# Patient Record
Sex: Female | Born: 1935 | Race: White | Hispanic: No | Smoking: Never smoker
Health system: Southern US, Community
[De-identification: ages and names within clinical notes are randomized; demographics above are authoritative.]

## PROBLEM LIST (undated history)

## (undated) DIAGNOSIS — M899 Disorder of bone, unspecified: Secondary | ICD-10-CM

## (undated) DIAGNOSIS — D126 Benign neoplasm of colon, unspecified: Secondary | ICD-10-CM

## (undated) DIAGNOSIS — K449 Diaphragmatic hernia without obstruction or gangrene: Secondary | ICD-10-CM

## (undated) DIAGNOSIS — R609 Edema, unspecified: Secondary | ICD-10-CM

## (undated) DIAGNOSIS — R1319 Other dysphagia: Secondary | ICD-10-CM

## (undated) DIAGNOSIS — J986 Disorders of diaphragm: Secondary | ICD-10-CM

## (undated) DIAGNOSIS — M545 Low back pain, unspecified: Secondary | ICD-10-CM

## (undated) DIAGNOSIS — K2970 Gastritis, unspecified, without bleeding: Secondary | ICD-10-CM

## (undated) DIAGNOSIS — G8929 Other chronic pain: Secondary | ICD-10-CM

## (undated) DIAGNOSIS — C4490 Unspecified malignant neoplasm of skin, unspecified: Secondary | ICD-10-CM

## (undated) DIAGNOSIS — K5730 Diverticulosis of large intestine without perforation or abscess without bleeding: Secondary | ICD-10-CM

## (undated) DIAGNOSIS — D649 Anemia, unspecified: Secondary | ICD-10-CM

## (undated) DIAGNOSIS — M1990 Unspecified osteoarthritis, unspecified site: Secondary | ICD-10-CM

## (undated) DIAGNOSIS — I872 Venous insufficiency (chronic) (peripheral): Secondary | ICD-10-CM

## (undated) DIAGNOSIS — G894 Chronic pain syndrome: Secondary | ICD-10-CM

## (undated) DIAGNOSIS — M949 Disorder of cartilage, unspecified: Secondary | ICD-10-CM

## (undated) DIAGNOSIS — R269 Unspecified abnormalities of gait and mobility: Secondary | ICD-10-CM

## (undated) DIAGNOSIS — F329 Major depressive disorder, single episode, unspecified: Secondary | ICD-10-CM

## (undated) DIAGNOSIS — I38 Endocarditis, valve unspecified: Secondary | ICD-10-CM

## (undated) DIAGNOSIS — I499 Cardiac arrhythmia, unspecified: Secondary | ICD-10-CM

## (undated) DIAGNOSIS — J449 Chronic obstructive pulmonary disease, unspecified: Secondary | ICD-10-CM

## (undated) DIAGNOSIS — F3289 Other specified depressive episodes: Secondary | ICD-10-CM

## (undated) DIAGNOSIS — E7800 Pure hypercholesterolemia, unspecified: Secondary | ICD-10-CM

## (undated) DIAGNOSIS — L97909 Non-pressure chronic ulcer of unspecified part of unspecified lower leg with unspecified severity: Secondary | ICD-10-CM

## (undated) DIAGNOSIS — I10 Essential (primary) hypertension: Secondary | ICD-10-CM

## (undated) HISTORY — DX: Cardiac arrhythmia, unspecified: I49.9

## (undated) HISTORY — DX: Pure hypercholesterolemia, unspecified: E78.00

## (undated) HISTORY — DX: Disorder of bone, unspecified: M89.9

## (undated) HISTORY — PX: ADENOIDECTOMY: SUR15

## (undated) HISTORY — PX: HIP SURGERY: SHX245

## (undated) HISTORY — DX: Edema, unspecified: R60.9

## (undated) HISTORY — DX: Essential (primary) hypertension: I10

## (undated) HISTORY — DX: Venous insufficiency (chronic) (peripheral): I87.2

## (undated) HISTORY — DX: Disorders of diaphragm: J98.6

## (undated) HISTORY — DX: Major depressive disorder, single episode, unspecified: F32.9

## (undated) HISTORY — DX: Low back pain: M54.5

## (undated) HISTORY — DX: Diaphragmatic hernia without obstruction or gangrene: K44.9

## (undated) HISTORY — DX: Diverticulosis of large intestine without perforation or abscess without bleeding: K57.30

## (undated) HISTORY — DX: Chronic pain syndrome: G89.4

## (undated) HISTORY — DX: Endocarditis, valve unspecified: I38

## (undated) HISTORY — DX: Anemia, unspecified: D64.9

## (undated) HISTORY — DX: Low back pain, unspecified: M54.50

## (undated) HISTORY — PX: BREAST SURGERY: SHX581

## (undated) HISTORY — DX: Unspecified osteoarthritis, unspecified site: M19.90

## (undated) HISTORY — PX: APPENDECTOMY: SHX54

## (undated) HISTORY — PX: TONSILLECTOMY: SUR1361

## (undated) HISTORY — DX: Other dysphagia: R13.19

## (undated) HISTORY — DX: Other specified depressive episodes: F32.89

## (undated) HISTORY — DX: Unspecified abnormalities of gait and mobility: R26.9

## (undated) HISTORY — PX: OTHER SURGICAL HISTORY: SHX169

## (undated) HISTORY — DX: Gastritis, unspecified, without bleeding: K29.70

## (undated) HISTORY — DX: Benign neoplasm of colon, unspecified: D12.6

## (undated) HISTORY — DX: Disorder of cartilage, unspecified: M94.9

## (undated) HISTORY — DX: Other chronic pain: G89.29

---

## 2008-07-03 ENCOUNTER — Encounter: Payer: Self-pay | Admitting: Pulmonary Disease

## 2009-01-15 ENCOUNTER — Encounter: Payer: Self-pay | Admitting: Pulmonary Disease

## 2009-01-20 ENCOUNTER — Encounter: Payer: Self-pay | Admitting: Pulmonary Disease

## 2009-03-29 ENCOUNTER — Encounter: Payer: Self-pay | Admitting: Pulmonary Disease

## 2009-04-15 ENCOUNTER — Encounter: Payer: Self-pay | Admitting: Pulmonary Disease

## 2009-06-02 ENCOUNTER — Encounter: Payer: Self-pay | Admitting: Pulmonary Disease

## 2009-09-01 ENCOUNTER — Encounter: Payer: Self-pay | Admitting: Pulmonary Disease

## 2009-11-04 ENCOUNTER — Inpatient Hospital Stay (HOSPITAL_COMMUNITY): Admission: EM | Admit: 2009-11-04 | Discharge: 2009-11-08 | Payer: Self-pay | Admitting: Emergency Medicine

## 2009-12-02 ENCOUNTER — Encounter (INDEPENDENT_AMBULATORY_CARE_PROVIDER_SITE_OTHER): Payer: Self-pay | Admitting: *Deleted

## 2009-12-02 ENCOUNTER — Ambulatory Visit: Payer: Self-pay | Admitting: Pulmonary Disease

## 2009-12-02 DIAGNOSIS — G894 Chronic pain syndrome: Secondary | ICD-10-CM

## 2009-12-02 DIAGNOSIS — R1319 Other dysphagia: Secondary | ICD-10-CM

## 2009-12-02 DIAGNOSIS — F329 Major depressive disorder, single episode, unspecified: Secondary | ICD-10-CM

## 2009-12-02 DIAGNOSIS — D126 Benign neoplasm of colon, unspecified: Secondary | ICD-10-CM | POA: Insufficient documentation

## 2009-12-02 DIAGNOSIS — D649 Anemia, unspecified: Secondary | ICD-10-CM

## 2009-12-02 DIAGNOSIS — J986 Disorders of diaphragm: Secondary | ICD-10-CM

## 2009-12-02 DIAGNOSIS — M949 Disorder of cartilage, unspecified: Secondary | ICD-10-CM

## 2009-12-02 DIAGNOSIS — R609 Edema, unspecified: Secondary | ICD-10-CM | POA: Insufficient documentation

## 2009-12-02 DIAGNOSIS — I38 Endocarditis, valve unspecified: Secondary | ICD-10-CM | POA: Insufficient documentation

## 2009-12-02 DIAGNOSIS — I872 Venous insufficiency (chronic) (peripheral): Secondary | ICD-10-CM | POA: Insufficient documentation

## 2009-12-02 DIAGNOSIS — E7800 Pure hypercholesterolemia, unspecified: Secondary | ICD-10-CM

## 2009-12-02 DIAGNOSIS — I10 Essential (primary) hypertension: Secondary | ICD-10-CM | POA: Insufficient documentation

## 2009-12-02 DIAGNOSIS — M545 Low back pain: Secondary | ICD-10-CM

## 2009-12-02 DIAGNOSIS — M1990 Unspecified osteoarthritis, unspecified site: Secondary | ICD-10-CM | POA: Insufficient documentation

## 2009-12-02 DIAGNOSIS — K5730 Diverticulosis of large intestine without perforation or abscess without bleeding: Secondary | ICD-10-CM | POA: Insufficient documentation

## 2009-12-02 DIAGNOSIS — M899 Disorder of bone, unspecified: Secondary | ICD-10-CM | POA: Insufficient documentation

## 2009-12-06 ENCOUNTER — Telehealth: Payer: Self-pay | Admitting: Pulmonary Disease

## 2009-12-15 ENCOUNTER — Telehealth: Payer: Self-pay | Admitting: Pulmonary Disease

## 2010-01-04 ENCOUNTER — Telehealth: Payer: Self-pay | Admitting: Pulmonary Disease

## 2010-01-17 ENCOUNTER — Ambulatory Visit
Admission: RE | Admit: 2010-01-17 | Discharge: 2010-01-17 | Payer: Self-pay | Source: Home / Self Care | Attending: Pulmonary Disease | Admitting: Pulmonary Disease

## 2010-01-20 ENCOUNTER — Telehealth: Payer: Self-pay | Admitting: Pulmonary Disease

## 2010-02-01 NOTE — Miscellaneous (Signed)
Summary: Records Release faxed to [facility], [place]  Clinical Lists Changes     records release signed by patient to get medical records from: Dr. Michelle Nasuti [facility] [address] [place] [phone]  P: [phone] F: [phone]  release faxed to the number above.  records release form sent to be scanned into EMR. Boone Master CNA/MA  December 02, 2009 5:34 PM

## 2010-02-01 NOTE — Progress Notes (Signed)
Summary: appt date  Phone Note Call from Patient Call back at [phone]   Caller: Son--mike Call For: nadel Reason for Call: Talk to Nurse Summary of Call: Patient was supposed to be called with a 6 week return visit w/Nadel, hasn't heard anything and is requesting appt date. Initial call taken by: Lehman Prom,  December 06, 2009 11:32 AM  Follow-up for Phone Call        per OV note pt needed a 6 week follow-up appt. Please advise.Carron Curie CMA  December 06, 2009 11:35 AM   Additional Follow-up for Phone Call Additional follow up Details #1::        ok to use 01-17-2010 at 2:30 for follow up [name] [facility]  December 06, 2009 11:44 AM   [id number]. Carron Curie CMA  December 06, 2009 11:47 AM  pt scheduled for 01-17-10 at 2:30 son aware. Carron Curie CMA  December 06, 2009 11:49 AM

## 2010-02-01 NOTE — Assessment & Plan Note (Signed)
Summary: new primary care consult-ok per sn-cj //kp   CC:  New patient to establish care....  History of Present Illness: 77 y/o WF, mother of Davon Folta, who has moved here from [place]...  she has multiple medical problems as noted below>>    ~  December 02, 2009:  New patient evaluation- she was hosp 11/3-7/11 after fall at home w/ comminuted left wrist fx & had surg by DrKKuzma   Current Problems:    ** we are attempting to get old records from her physicians in [place] **  DIAPHRAGMATIC DISORDER (ICD-519.4) - she has a chronically elev right hemidiaph, apparently idiopathic; and she is mostly asymptomatic w/o cough, phlegm, hemoptysis, ch in SOB, etc...  ~  11/11:  CXR showed calcif Ao, elev right hemidiaph, mild scarring at bases, NAD...  HYPERTENSION (ICD-401.9) VALVULAR HEART DISEASE (ICD-424.90) Hx of CARDIAC ARRHYTHMIA (ICD-427.9) - she is on ASA 81mg /d, DILACOR XR 240mg  Bid, VASOTEC 20mg /d, LASIX 40mg /d... BP today = 130/70 & she tolerates the meds well w/o apparent side effects... she denies HA, visual changes, CP, palipit, dizziness, syncope, ch in dyspnea, etc... she has been told about some type of valvular heart disease but she doesn't know the details & not sure when her last 2DEcho was done... she has never been on blood thinners etc...  ~ ~ 11/11:  EKG showed NSR, NSSTTWA, NAD...  VENOUS INSUFFICIENCY (ICD-459.81) LEG EDEMA, CHRONIC (ICD-782.3) - she has severe venous stasis changes and chronic edema, thickened skin over LE's etc... she knows to be careful w/ hygiene, elevation, no salt, etc...  HYPERCHOLESTEROLEMIA (ICD-272.0) - she has been on MEVACOR 20mg /d from her [place] physician... we do not have Lipid Profile results to review & we have sent for old records...  OTHER DYSPHAGIA (ICD-787.29) - she is on PRILOSEC 20mg /d & reports occas dysphagia but denies choking etc... she apparently has never had an Endoscopy or GI eval for this problem, "I  have to be careful when I eat"...  DIVERTICULOSIS OF COLON (ICD-562.10) Hx of COLONIC POLYPS (ICD-211.3) - she tells me that prev colonoscopy in [place] showed divertics & ?polyp but she does not recall any details...  she denies much constipation despite the narcotic analgesics- uses prunes as needed.  DEGENERATIVE JOINT DISEASE (ICD-715.90) - she has severe osteoarthritis w/ XRays 11/11 showing severe osteopenia, severe right hip degeneration & relative sparing of the left hip joint;  DJD knees w/ ?CPPD, abn patella;  left wrist fx- radial  head & ulnar styloid... she had surg left wrist by DrKKuzma, & currently in cast/ splint... Hx of BACK PAIN, LUMBAR (ICD-724.2) CHRONIC PAIN SYNDROME (ICD-338.4) - she has chronic pain- mostly from her arthritic condition- and was started on MSContin by her LMD in [place] in the summer of 2011> dose slowly increased & she is currently on 90mg  Bid (she takes a 60mg  tab + 30mg  tab Bid)... she does not believe that this med has anything to do w/ her recent fall, or her complaints of decr memory etc... we discussed the need to wean off this medication & try to deal w/ her arthritis pain thru an Orthopedist or poss a Pain Clinic...  ~  11/11:  she notes pain worse during the day while up & about> try to decr MSContin to 90mg AM & 60mg PM...  OSTEOPENIA (ICD-733.90) - XRays here showed severe osteopenia... she notes that she used to be on Fosamax but ?how long? & when stopped?... she states that she was told her prev BMD  was "good"... she has not been taking calcium supplements, but was prev on some OTC Vit D supplements... asked to resume Calcium, Women's MVI, Vit D 1000u daily...  Hx of DEPRESSION (ICD-311) - on CELEXA 20mg /d and she wants to continue this med...  ANEMIA, MILD (ICD-285.9) - Hg post op 11/11 wrist fx surg = 11.2   Preventive Screening-Counseling & Management  Alcohol-Tobacco     Smoking Status: never  Allergies (verified): 1)  ! Sulfa  Past  History:  Past Medical History: DIAPHRAGMATIC DISORDER (ICD-519.4) HYPERTENSION (ICD-401.9) VALVULAR HEART DISEASE (ICD-424.90) Hx of CARDIAC ARRHYTHMIA (ICD-427.9) VENOUS INSUFFICIENCY (ICD-459.81) LEG EDEMA, CHRONIC (ICD-782.3) HYPERCHOLESTEROLEMIA (ICD-272.0) OTHER DYSPHAGIA (ICD-787.29) DIVERTICULOSIS OF COLON (ICD-562.10) Hx of COLONIC POLYPS (ICD-211.3) DEGENERATIVE JOINT DISEASE (ICD-715.90) Hx of BACK PAIN, LUMBAR (ICD-724.2) CHRONIC PAIN SYNDROME (ICD-338.4) OSTEOPENIA (ICD-733.90) Hx of DEPRESSION (ICD-311) ANEMIA, MILD (ICD-285.9)  Past Surgical History: S/P T & A S/P appendectomy S/P bilat cataracts S/P breast surgery 1973 (benign) S/P right wrist surgery for fx after fall- 11/11 DrKuzma  Social History: widowed Lennox Leikam never smoked no alcohol  retired Smoking Status:  never  Review of Systems       The patient complains of fatigue, weakness, malaise, hoarseness, dyspnea on exertion, peripheral edema, constipation, dysphagia, joint pain, stiffness, arthritis, difficulty walking, and depression.  The patient denies fever, chills, sweats, anorexia, weight loss, sleep disorder, blurring, diplopia, eye irritation, eye discharge, vision loss, eye pain, photophobia, earache, ear discharge, tinnitus, decreased hearing, nasal congestion, nosebleeds, sore throat, chest pain, palpitations, syncope, orthopnea, PND, cough, dyspnea at rest, excessive sputum, hemoptysis, wheezing, pleurisy, nausea, vomiting, diarrhea, change in bowel habits, abdominal pain, melena, hematochezia, jaundice, gas/bloating, indigestion/heartburn, odynophagia, dysuria, hematuria, urinary frequency, urinary hesitancy, nocturia, incontinence, back pain, joint swelling, muscle cramps, muscle weakness, sciatica, restless legs, leg pain at night, leg pain with exertion, rash, itching, dryness, suspicious lesions, paralysis, paresthesias, seizures, tremors, vertigo, transient  blindness, frequent falls, frequent headaches, anxiety, memory loss, confusion, cold intolerance, heat intolerance, polydipsia, polyphagia, polyuria, unusual weight change, abnormal bruising, bleeding, enlarged lymph nodes, urticaria, allergic rash, hay fever, and recurrent infections.    Vital Signs:  Patient profile:   77 year old female Height:      63 inches Weight:      162 pounds BMI:     28.80 O2 Sat:      89 % on Room air Temp:     98.9 degrees F oral Pulse rate:   77 / minute BP sitting:   130 / 70  (right arm) Cuff size:   regular  Vitals Entered By: [name] CMA (December 02, 2009 1:58 PM)  O2 Sat at Rest %:  89 O2 Flow:  Room air CC: New patient to establish care... Is Patient Diabetic? No Pain Assessment Patient in pain? yes      Onset of pain  wrist pain ---more pain in knees and hips Comments meds updated today with pt and son   Physical Exam  Additional Exam:  WD, petite, 77 y/o WF chr ill appearing & in NAD... GENERAL:  Alert & oriented; pleasant & cooperative... HEENT:  [place]/AT, EOM-wnl, PERRLA, EACs-clear, TMs-wnl, NOSE-clear, THROAT-clear & wnl. NECK:  Supple w/ fairROM; no JVD; normal carotid impulses w/o bruits; no thyromegaly or nodules palpated; no lymphadenopathy. CHEST:  Decr BS right base w/ few rales, clear on left... HEART:  Regular Rhythm; gr 1-2/ 6 sys murmur at base, no diastolic murmur detected, w/o rubs or gallops heard... ABDOMEN:  Soft & nontender;  normal bowel sounds; no organomegaly or masses detected. EXT:  severe osteoathritic changes on hands, lef twristcast/ splint; decr ROM knees & hips, esp right;  severe chronic venous insuffic changes w/ thickened skin & mild chr tissue edema... NEURO:  CN's intact;  no focal neuro deficits... DERM:  No lesions noted; no rash etc...    MISC. Report  Procedure date:  12/02/2009  Findings:      DATA REVIEWED:  ~  Hosp records from 11/3-7 admission including H&P, DCSummary, XRays, Labs  reports, & EKG...   Impression & Recommendations:  Problem # 1:  DIAPHRAGMATIC DISORDER (ICD-519.4) She has chr elev right hemidiaph w/ assoc mild basilar atx & hypoxemia...  Problem # 2:  HYPERTENSION (ICD-401.9) Controlled on meds & recent hosp labs were WNL.Marland KitchenMarland Kitchen Her updated medication list for this problem includes:    Diltiazem Hcl Cr 240 Mg Xr24h-cap (Diltiazem hcl) .Marland Kitchen... Take one tablet by mouth two times a day    Vasotec 20 Mg Tabs (Enalapril maleate) .Marland Kitchen... Take one tablet by mouth once daily    Lasix 40 Mg Tabs (Furosemide) .Marland Kitchen... Take 1 tablet by mouth once a day  Problem # 3:  VALVULAR HEART DISEASE (ICD-424.90) We are awaiting records from [place] to assertain when last 2DEcho was done & when f/u procedure is warranted... Her updated medication list for this problem includes:    Aspirin 81 Mg Tabs (Aspirin) .Marland Kitchen... Take 1 tablet by mouth once a day  Problem # 4:  VENOUS INSUFFICIENCY (ICD-459.81) She has severe chr ven insuffic & chr edema... we discussed the need for low sodium diet, elevation, continue the Lasix...  Problem # 5:  HYPERCHOLESTEROLEMIA (ICD-272.0) She will continue the Mevacor & we will check old records when avail & check FLP on return... Her updated medication list for this problem includes:    Mevacor 20 Mg Tabs (Lovastatin) .Marland Kitchen... Take one tablet by mouth at bedtime  Problem # 6:  DIVERTICULOSIS OF COLON (ICD-562.10) GI appears stable & only min constip w/ all her pain meds...  Problem # 7:  DEGENERATIVE JOINT DISEASE (ICD-715.90) Severe DJD esp right hip & she has appt DrBlackman... she is only 77 y/o & may be a candidate for THR... Her updated medication list for this problem includes:    Aspirin 81 Mg Tabs (Aspirin) .Marland Kitchen... Take 1 tablet by mouth once a day    Morphine Sulfate Cr 60 Mg Xr12h-tab (Morphine sulfate) .Marland Kitchen... Take 1 tab by mouth two times a day as directed...    Morphine Sulfate Cr 30 Mg Xr12h-tab (Morphine sulfate) .Marland Kitchen... Take 1 tab by mouth  once daily in the am with the 60mg  tab for a total of 90mg  in am...  Problem # 8:  CHRONIC PAIN SYNDROME (ICD-338.4) We are going to start weaning her off the chr Morphine therapy... for now decr the 90mg  Bid to 90mg  AM & 60mg  PM...  Problem # 9:  OSTEOPENIA (ICD-733.90) By Jillyn Hidden she appears to have severe osteoporosis but she says she was told BMD was "good" and her Fosamax was stopped... we are awaitingh records but I suspect she will need f/u BMd & consideration of Forteo Rx (the wrist fx was relatively minor trauma & is a deficiency fx in my opinion)...  Problem # 10:  OTHER MEDICAL PROBLEMS AS NOTED>>>  Complete Medication List: 1)  Aspirin 81 Mg Tabs (Aspirin) .... Take 1 tablet by mouth once a day 2)  Diltiazem Hcl Cr 240 Mg Xr24h-cap (Diltiazem hcl) .... Take one tablet  by mouth two times a day 3)  Vasotec 20 Mg Tabs (Enalapril maleate) .... Take one tablet by mouth once daily 4)  Lasix 40 Mg Tabs (Furosemide) .... Take 1 tablet by mouth once a day 5)  Mevacor 20 Mg Tabs (Lovastatin) .... Take one tablet by mouth at bedtime 6)  Prilosec 20 Mg Cpdr (Omeprazole) .... Take 1 tablet by mouth once a day 7)  Miralax Powd (Polyethylene glycol 3350) .... Once daily 8)  Celexa 20 Mg Tabs (Citalopram hydrobromide) .... Take 1 tablet by mouth once a day 9)  Morphine Sulfate Cr 60 Mg Xr12h-tab (Morphine sulfate) .... Take 1 tab by mouth two times a day as directed... 10)  Morphine Sulfate Cr 30 Mg Xr12h-tab (Morphine sulfate) .... Take 1 tab by mouth once daily in the am with the 60mg  tab for a total of 90mg  in am... 11)  Caltrate 600+d Plus 600-400 Mg-unit Tabs (Calcium carbonate-vit d-min) .... Take one calcium tab daily... 12)  Womens Multivitamin Plus Tabs (Multiple vitamins-minerals) .... Take one vitamin supplement daily... 13)  Vitamin D3 1000 Unit Tabs (Cholecalciferol) .... Take one vit d supplement daily...  Patient Instructions: 1)  Today we updated your med list- see below.... 2)   We decided to try and wean down the dose of your Morphine pain medication:  try 90mg  in the AM, and 60mg  on the PM..Marland Kitchen 3)  We will attempt to get records from your doctor in [place] to help [place] with your care going forward... 4)  Call for any problems.Marland KitchenMarland Kitchen 5)  Let's plan a follow up appt in about 6 weeks.Marland KitchenMarland Kitchen

## 2010-02-02 ENCOUNTER — Telehealth: Payer: Self-pay | Admitting: Pulmonary Disease

## 2010-02-03 NOTE — Progress Notes (Signed)
Summary: refills  Phone Note Call from Patient Call back at [phone]   Caller: Patient Call For: nadel Reason for Call: Refill Medication Summary of Call: Pt requests refills on diltiazem hcl cr 240mg  and vasotec 20mg .// Olevia Perches phone number is [phone] Initial call taken by: Darletta Moll,  January 04, 2010 4:26 PM  Follow-up for Phone Call        [facility] x 1. Calling to confirm medications and pharmacy with pt. Zackery Barefoot CMA  January 04, 2010 5:03 PM   I spoke with the pt and she wants rx sent to [facility] on [address]. refills sent. Carron Curie CMA  January 05, 2010 10:13 AM     Prescriptions: VASOTEC 20 MG TABS (ENALAPRIL MALEATE) take one tablet by mouth once daily  #30 x 3   Entered by:   Carron Curie CMA   Authorized by:   Michele Mcalpine MD   Signed by:   Carron Curie CMA on 01/05/2010   Method used:   Electronically to        [id number] (retail)       [address]       [place], [place]  [address]       Ph: [phone]       Fax: [phone]   RxID:   [id number] DILTIAZEM HCL CR 240 MG XR24H-CAP (DILTIAZEM HCL) take one tablet by mouth two times a day  #60 x 3   Entered by:   Carron Curie CMA   Authorized by:   Michele Mcalpine MD   Signed by:   Carron Curie CMA on 01/05/2010   Method used:   Electronically to        [id number] (retail)       [address]       [place], [place]  [address]       Ph: [phone]       Fax: [phone]   RxID:   [id number]

## 2010-02-03 NOTE — Progress Notes (Signed)
Summary: has diarrhea wants to know what to get   Phone Note Call from Patient   Caller: Patient Call For: dr [name] Summary of Call: Patient phoned and stated that she has diarrhea and wants to know what she should get from the pharmacy keopeptate or what is recconmed these days? She can be reached at [phone] Initial call taken by: Vedia Coffer,  January 20, 2010 3:54 PM  Follow-up for Phone Call        per SN---recs are to use otc immodium 1-2 tabs by mouth every 6 hours as needed for watery diarrhea, align once capsule daily and activia yogurt once daily.Esaw Dace stated that she will have her daughter call me back since she was having a hard time getting all of this down [name] CMA  January 20, 2010 4:22 PM   Additional Follow-up for Phone Call Additional follow up Details #1::        pts daughter in law called me back and she is aware of SN recs.  will call back for further problems [name] [facility]  January 20, 2010 4:29 PM

## 2010-02-03 NOTE — Assessment & Plan Note (Signed)
Summary: 6 week f/u//jrc   CC:  6 week ROV & review of mult medical issues....  History of Present Illness: 77 y/o WF, mother of Redonna Wilbert, who has moved here from [place]...  she has multiple medical problems as noted below>>    ~  December 02, 2009:  New patient evaluation- she was hosp 11/3-7/11 after fall at home w/ comminuted left wrist fx & had surg by DrKKuzma > SEE INITIAL PROBLEM LIST & DISCUSSION BELOW:    ~  January 17, 2010:  she has severe DJD (esp right hip), left wrist fx & surg, +LBP, etc> on MSContin per LMD in [place] that we have been trying to wean- now down from 90mg Bid to 60mg Bid (she had some withdrawal sweats as she weaned but now improved)... family notes she is "more with it"... she had f/u Ortho, DrBlackman, & given injection in knees & hip (helped some), started on MOBIC 15mg /d as well, he is considering right THR w/ ant approach... still f/u w/ DrKuzma for wrist, getting PT/ rehab weekly, improved... she will be moving to [place] independ living... we discussed slowly decr MS further 60-30, 30-30, and f/u 38mo.    Current Problems:    ** we are attempting to get old records from her physicians in [place] **  DIAPHRAGMATIC DISORDER (ICD-519.4) - she has a chronically elev right hemidiaph, apparently idiopathic; and she is mostly asymptomatic w/o cough, phlegm, hemoptysis, ch in SOB, etc...  ~  11/11:  CXR showed calcif Ao, elev right hemidiaph, mild scarring at bases, NAD...  HYPERTENSION (ICD-401.9) VALVULAR HEART DISEASE (ICD-424.90) Hx of CARDIAC ARRHYTHMIA (ICD-427.9) - she is on ASA 81mg /d, DILACOR XR 240mg  Bid, VASOTEC 20mg /d, LASIX 40mg /d... BP today = 132/68 & she tolerates the meds well w/o apparent side effects... she denies HA, visual changes, CP, palipit, dizziness, syncope, ch in dyspnea, etc... she has been told about some type of valvular heart disease but she doesn't know the details & not sure when her last 2DEcho was done... she  has never been on blood thinners etc...  ~  11/11:  EKG showed NSR, NSSTTWA, NAD...  VENOUS INSUFFICIENCY (ICD-459.81) LEG EDEMA, CHRONIC (ICD-782.3) - she has severe venous stasis changes and chronic edema, thickened skin over LE's etc... she knows to be careful w/ hygiene, elevation, no salt, etc...  HYPERCHOLESTEROLEMIA (ICD-272.0) - she has been on MEVACOR 20mg /d from her [place] physician... we do not have Lipid Profile results to review & we have sent for old records...  OTHER DYSPHAGIA (ICD-787.29) - she is on PRILOSEC 20mg /d & reports occas dysphagia but denies choking etc... she apparently has never had an Endoscopy or GI eval for this problem, "I have to be careful when I eat"...  DIVERTICULOSIS OF COLON (ICD-562.10) Hx of COLONIC POLYPS (ICD-211.3) - she tells me that prev colonoscopy in [place] showed divertics & ?polyp but she does not recall any details...  she denies much constipation despite the narcotic analgesics- uses prunes as needed.  DEGENERATIVE JOINT DISEASE (ICD-715.90) - she has severe osteoarthritis w/ XRays 11/11 showing severe osteopenia, severe right hip degeneration & relative sparing of the left hip joint;  DJD knees w/ ?CPPD, abn patella;  left wrist fx- radial  head & ulnar styloid... she had surg left wrist by DrKKuzma, & currently in cast/ splint...  ~  1/12:  they tell me that DrBlackman plans right THR via ant approach when she is ready. Hx of BACK PAIN, LUMBAR (ICD-724.2) CHRONIC PAIN SYNDROME (ICD-338.4) -  she has chronic pain- mostly from her arthritic condition- and was started on MSContin by her LMD in [place] in the summer of 2011> dose slowly increased & she was on 90mg  Bid (taking a 60mg  tab + 30mg  tab Bid)... she does not believe that this med has anything to do w/ her fall & wrist fx, or her complaints of decr memory etc... we discussed the need to wean off this medication & try to deal w/ her arthritis pain thru an Orthopedist or poss a Pain  Clinic...  ~  11/11:  she notes pain worse during the day while up & about> try to decr MSContin to 90mg AM & 60mg PM...  ~  1/12:  she is down to 60mg Bid & we discussed weaning further.  OSTEOPENIA (ICD-733.90) - XRays here showed severe osteopenia... she notes that she used to be on Fosamax but ?how long? & when stopped?... she states that she was told her prev BMD was "good"... she has not been taking calcium supplements, but was prev on some OTC Vit D supplements... asked to resume Calcium, Women's MVI, Vit D 1000u daily...  Hx of DEPRESSION (ICD-311) - on CELEXA 20mg /d and she wants to continue this med...  ANEMIA, MILD (ICD-285.9) - Hg post op 11/11 wrist fx surg = 11.2   Preventive Screening-Counseling & Management  Alcohol-Tobacco     Smoking Status: never  Allergies: 1)  ! Sulfa  Comments:  Nurse/Medical Assistant: The patient's medications and allergies were reviewed with the patient and were updated in the Medication and Allergy Lists.  Past History:  Past Medical History: DIAPHRAGMATIC DISORDER (ICD-519.4) HYPERTENSION (ICD-401.9) VALVULAR HEART DISEASE (ICD-424.90) Hx of CARDIAC ARRHYTHMIA (ICD-427.9) VENOUS INSUFFICIENCY (ICD-459.81) LEG EDEMA, CHRONIC (ICD-782.3) HYPERCHOLESTEROLEMIA (ICD-272.0) OTHER DYSPHAGIA (ICD-787.29) DIVERTICULOSIS OF COLON (ICD-562.10) Hx of COLONIC POLYPS (ICD-211.3) DEGENERATIVE JOINT DISEASE (ICD-715.90) Hx of BACK PAIN, LUMBAR (ICD-724.2) CHRONIC PAIN SYNDROME (ICD-338.4) OSTEOPENIA (ICD-733.90) Hx of DEPRESSION (ICD-311) ANEMIA, MILD (ICD-285.9)  Past Surgical History: S/P T & A S/P appendectomy S/P bilat cataracts S/P breast surgery 1973 (benign) S/P right wrist surgery for fx after fall- 11/11 DrKuzma  Family History: Reviewed history and no changes required.  Social History: Reviewed history from 12/02/2009 and no changes required. widowed Agape Hardiman never smoked no alcohol   retired  Review of Systems      See HPI       The patient complains of decreased hearing, dyspnea on exertion, peripheral edema, muscle weakness, and difficulty walking.  The patient denies anorexia, fever, weight loss, weight gain, vision loss, hoarseness, chest pain, syncope, prolonged cough, headaches, hemoptysis, abdominal pain, melena, hematochezia, severe indigestion/heartburn, hematuria, incontinence, suspicious skin lesions, transient blindness, depression, unusual weight change, abnormal bleeding, enlarged lymph nodes, and angioedema.    Vital Signs:  Patient profile:   77 year old female Height:      63 inches Weight:      136.13 pounds BMI:     24.20 O2 Sat:      96 % on Room air Temp:     96.8 degrees F oral Pulse rate:   70 / minute BP sitting:   132 / 68  (right arm) Cuff size:   regular  Vitals Entered By: [name] CMA (January 17, 2010 2:30 PM)  O2 Sat at Rest %:  96 O2 Flow:  Room air CC: 6 week ROV & review of mult medical issues... Is Patient Diabetic? No Pain Assessment Patient in pain? no      Comments meds udpated today  with pt   Physical Exam  Additional Exam:  WD, petite, 77 y/o WF chr ill appearing & in NAD... GENERAL:  Alert & oriented; pleasant & cooperative... HEENT:  Emma/AT, EOM-wnl, PERRLA, EACs-clear, TMs-wnl, NOSE-clear, THROAT-clear & wnl. NECK:  Supple w/ fairROM; no JVD; normal carotid impulses w/o bruits; no thyromegaly or nodules palpated; no lymphadenopathy. CHEST:  Decr BS right base w/ few rales, clear on left... HEART:  Regular Rhythm; gr 1-2/ 6 sys murmur at base, no diastolic murmur detected, w/o rubs or gallops heard... ABDOMEN:  Soft & nontender; normal bowel sounds; no organomegaly or masses detected. EXT:  severe osteoathritic changes on hands, lef twristcast/ splint; decr ROM knees & hips, esp right;  severe chronic venous insuffic changes w/ thickened skin & mild chr tissue edema... NEURO:  CN's intact;  no focal neuro  deficits... DERM:  No lesions noted; no rash etc...    Impression & Recommendations:  Problem # 1:  HYPERTENSION (ICD-401.9) BP controlled on meds>  no CP, palpit, ch in SOB or edema... continue same. Her updated medication list for this problem includes:    Diltiazem Hcl Cr 240 Mg Xr24h-cap (Diltiazem hcl) .Marland Kitchen... Take one tablet by mouth two times a day    Vasotec 20 Mg Tabs (Enalapril maleate) .Marland Kitchen... Take one tablet by mouth once daily    Lasix 40 Mg Tabs (Furosemide) .Marland Kitchen... Take 1 tablet by mouth once a day  Problem # 2:  LEG EDEMA, CHRONIC (ICD-782.3) Edema improved, wt down, etc>  same med, no salt, etc... Her updated medication list for this problem includes:    Lasix 40 Mg Tabs (Furosemide) .Marland Kitchen... Take 1 tablet by mouth once a day  Problem # 3:  HYPERCHOLESTEROLEMIA (ICD-272.0) We discussed f/u FLP on return... Her updated medication list for this problem includes:    Mevacor 20 Mg Tabs (Lovastatin) .Marland Kitchen... Take one tablet by mouth at bedtime  Problem # 4:  OTHER DYSPHAGIA (ICD-787.29) She has some persist dysphagia symptoms & we discussed poss incr med Prilosec OTC 20mg  to Bid, vs GI eval w/ EGD/ dil/ etc... she declines further eval or incr meds at this point & we will continue to follow... she knows to call for worsening symptoms for GI referral.  Problem # 5:  DEGENERATIVE JOINT DISEASE (ICD-715.90) Followed by DrBlackman for Ortho- hip/ knees; and DrKuzma- wrist... Her updated medication list for this problem includes:    Aspirin 81 Mg Tabs (Aspirin) .Marland Kitchen... Take 1 tablet by mouth once a day    Morphine Sulfate Cr 60 Mg Xr12h-tab (Morphine sulfate) .Marland Kitchen... Take 1 tab by mouth daily in the am... (do not fill before 02/17/10)    Morphine Sulfate Cr 30 Mg Xr12h-tab (Morphine sulfate) .Marland Kitchen... Take 2 tabs by mouth once daily in the evening... (do not fill before 02/17/10)  Problem # 6:  CHRONIC PAIN SYNDROME (ICD-338.4) We discussed further slow wean of her MS Contin from 60mg  Bid now to  60-30, then 30-30 if able... meds written as above...  Problem # 7:  Hx of DEPRESSION (ICD-311) Stable>  continue Celexa per request... Her updated medication list for this problem includes:    Celexa 20 Mg Tabs (Citalopram hydrobromide) .Marland Kitchen... Take 1 tablet by mouth once a day  Problem # 8:  OTHER MEDICAL PROBLEMS AS NOTED>>>  Complete Medication List: 1)  Aspirin 81 Mg Tabs (Aspirin) .... Take 1 tablet by mouth once a day 2)  Diltiazem Hcl Cr 240 Mg Xr24h-cap (Diltiazem hcl) .... Take one tablet by  mouth two times a day 3)  Vasotec 20 Mg Tabs (Enalapril maleate) .... Take one tablet by mouth once daily 4)  Lasix 40 Mg Tabs (Furosemide) .... Take 1 tablet by mouth once a day 5)  Mevacor 20 Mg Tabs (Lovastatin) .... Take one tablet by mouth at bedtime 6)  Prilosec 20 Mg Cpdr (Omeprazole) .... Take 1 tablet by mouth once a day 7)  Miralax Powd (Polyethylene glycol 3350) .... Once daily 8)  Celexa 20 Mg Tabs (Citalopram hydrobromide) .... Take 1 tablet by mouth once a day 9)  Caltrate 600+d Plus 600-400 Mg-unit Tabs (Calcium carbonate-vit d-min) .... Take one calcium tab daily... 10)  Womens Multivitamin Plus Tabs (Multiple vitamins-minerals) .... Take one vitamin supplement daily... 11)  Vitamin D3 1000 Unit Tabs (Cholecalciferol) .... Take one vit d supplement daily... 12)  Morphine Sulfate Cr 60 Mg Xr12h-tab (Morphine sulfate) .... Take 1 tab by mouth daily in the am... (do not fill before 02/17/10) 13)  Morphine Sulfate Cr 30 Mg Xr12h-tab (Morphine sulfate) .... Take 2 tabs by mouth once daily in the evening... (do not fill before 02/17/10)  Patient Instructions: 1)  Today we updated your med list- see below.... 2)  We decided to continue the slow Morphine taper by trying 60mg  in the AM, and 30mg  in the PM..Marland Kitchen 3)  You may continue to taper from there if you are able based on the amount of pain you are having... 4)  Call for any questions.Marland KitchenMarland Kitchen 5)  Please schedule a follow-up appointment in 3  months, sooner as needed... Prescriptions: MORPHINE SULFATE CR 30 MG XR12H-TAB (MORPHINE SULFATE) take 2 tabs by mouth once daily in the EVENING... (DO NOT FILL BEFORE 02/17/10)  #60 x 0   Entered and Authorized by:   Michele Mcalpine MD   Signed by:   Michele Mcalpine MD on 01/17/2010   Method used:   Print then Give to Patient   RxID:   [id number] MORPHINE SULFATE CR 60 MG XR12H-TAB (MORPHINE SULFATE) take 1 tab by mouth daily in the AM... (DO NOT FILL BEFORE 02/17/10)  #30 x 0   Entered and Authorized by:   Michele Mcalpine MD   Signed by:   Michele Mcalpine MD on 01/17/2010   Method used:   Print then Give to Patient   RxID:   [id number] MORPHINE SULFATE CR 30 MG XR12H-TAB (MORPHINE SULFATE) take 2 tabs by mouth once daily in the EVENING...  #60 x 0   Entered and Authorized by:   Michele Mcalpine MD   Signed by:   Michele Mcalpine MD on 01/17/2010   Method used:   Print then Give to Patient   RxID:   [id number] MORPHINE SULFATE CR 60 MG XR12H-TAB (MORPHINE SULFATE) take 1 tab by mouth daily in the AM...  #30 x 0   Entered and Authorized by:   Michele Mcalpine MD   Signed by:   Michele Mcalpine MD on 01/17/2010   Method used:   Print then Give to Patient   RxID:   [id number]

## 2010-02-03 NOTE — Progress Notes (Signed)
Summary: morphine  Phone Note Call from Patient Call back at [phone]   Caller: Patient Call For: nadel Reason for Call: Talk to Nurse Summary of Call: Patient said [name] is trying to wean her off morphine.  He decreased dose at last ov, patient said she is calling to let him now she has been fine with decreased dose. Initial call taken by: Lehman Prom,  December 15, 2009 2:09 PM  Follow-up for Phone Call        called spoke with patient who verified that the decreased dosing of her morphine is doing well for her pain management - taking morphine 90mg  in the AM and 60mg  in the PM as recommended at ov.  pt would like to know if SN wants her to continue at this dosing until 1.16.12 or decrease it more.  please advise, thanks! Boone Master CNA/MA  December 15, 2009 4:01 PM   Additional Follow-up for Phone Call Additional follow up Details #1::        per SN---lets keep going---so decrease the morphine  to 60mg  two times a day ----if this does not work we can go back up----lmomtcb for pt [name] CMA  December 15, 2009 5:16 PM     Additional Follow-up for Phone Call Additional follow up Details #2::    attempted to call pt again---lmomtcb [name] CMA  December 16, 2009 10:56 AM    called and spoke with pts son due to unable to reach pt---he stated that she did see Dr. Lina Sar did give pt an injection in her hip and is trying to avoid any surgery---he is aware of the change in the morphine---60mg  two times a day and we will reassess this at her appt in jan to reduce or keep the same.  pts son also wanted to know that after her wrist heals--pt is wanting to live on her on and he is not sure if SN is ok with this.  explained that i would check with SN and give him a call back with recs about that.   [name] [facility]  December 16, 2009 2:07 PM   Additional Follow-up for Phone Call Additional follow up Details #3:: Details for Additional Follow-up Action Taken: called  and spoke with pts son and he is aware per SN that if the son feels that pt is capable of living on her own then SN does not have a problem with that.  he will have to leave this up to the family to make that decision.  pts son mike voiced his understanding of this [name] [facility]  December 16, 2009 5:35 PM

## 2010-02-09 NOTE — Progress Notes (Signed)
Summary: has questions regarding his mom  Phone Note Call from Patient   Caller: SON/[name] Call For: Othman Masur Reason for Call: Referral Summary of Call: Patients son phoned and has several questions regarding his mom he can be reached at [phone] Initial call taken by: Vedia Coffer,  February 02, 2010 9:08 AM  Follow-up for Phone Call        Spoke with pt son [name] and he has some questions about pt morphine: 1) Pt has tapered to 60mg  morphine in Am ans 30mg  in PM. When she tapered to this dose she went through some withdrawl symptoms such as chills. shakes, loss of appetite. He states these lasted about 8 days. Pt is fine now.  She is having some increased pain in her hip, but she still wants to keep tapering dose. [name] wants to know can the morphine be tapered in smaller increments. Instead of dropping by 30mg  can they decrease by 10 or 15mg  instead? If so they will need rx for this.   2) also, [name] states that he knows the ultimate goal is to have surgery on pt hip to alleviate the pain source, but he wants to know can she have the operation while still on morphine, or does she need to be completly of the medication before surgery? Please advise. Carron Curie CMA  February 02, 2010 9:57 AM  [facility]  Additional Follow-up for Phone Call Additional follow up Details #1::        per SN----yes she can still be on pain meds with the surgery---the morphine also comes in 15mg  tabs---now on 60mg  every am and 30mg  every pm----how many of each size pil are left after today? [name] CMA  February 02, 2010 1:35 PM      Additional Follow-up for Phone Call Additional follow up Details #2::    Spoke with pt's son and advised of the above recs per SN.  He states that he is unsure of how many tablets she has left of each, but states that she has plenty of pills left.  He states that he had thought about breaking it in half but another doc advised that doing that with morphine could be  very dangerous.  Pls advise thanks! Follow-up by: Vernie Murders,  February 02, 2010 2:08 PM  Additional Follow-up for Phone Call Additional follow up Details #3:: Details for Additional Follow-up Action Taken: called and spoke with pts son and he stated that the pt has #32 of the 60mg   and  #110 of the 30mg ---he will hold on to these in case the  pt will need to go back up on the dose---for now we will give them the rx for the morphine 15mg  tabs and she will decrease to 45 mg /30mg    for at least 2 wks and then we will try 30mg /30mg  for couple of weeks and down to  30mg /15mg  and so every several weeks---her son will keep [place] up to date and call for any concerns.  he is aware that rx is up front to be picked up [name] CMA  February 03, 2010 12:50 PM   New/Updated Medications: MORPHINE SULFATE CR 15 MG XR12H-TAB (MORPHINE SULFATE) take as directed Prescriptions: MORPHINE SULFATE CR 15 MG XR12H-TAB (MORPHINE SULFATE) take as directed  #100 x 0   Entered by:   [name] CMA   Authorized by:   Michele Mcalpine MD   Signed by:   [name] CMA  on 02/03/2010   Method used:   Print then Give to Patient   RxID:   [id number]

## 2010-02-15 ENCOUNTER — Telehealth (INDEPENDENT_AMBULATORY_CARE_PROVIDER_SITE_OTHER): Payer: Self-pay | Admitting: *Deleted

## 2010-02-17 NOTE — Letter (Signed)
Summary: Michelle Nasuti MD  Michelle Nasuti MD   Imported By: Lester [place] 02/07/2010 09:52:01  _____________________________________________________________________  External Attachment:    Type:   Image     Comment:   External Document

## 2010-02-17 NOTE — Op Note (Signed)
Summary: Cataract/Rohit Truddie Hidden MD  Cataract/Rohit Truddie Hidden MD   Imported By: Lester [place] 02/07/2010 09:42:58  _____________________________________________________________________  External Attachment:    Type:   Image     Comment:   External Document

## 2010-02-17 NOTE — Letter (Signed)
Summary: Tseng,Chun-Ming MD  Tseng,Chun-Ming MD   Imported By: Lester [place] 02/07/2010 09:47:31  _____________________________________________________________________  External Attachment:    Type:   Image     Comment:   External Document

## 2010-02-17 NOTE — Letter (Signed)
Summary: Michelle Nasuti MD  Michelle Nasuti MD   Imported By: Lester  02/07/2010 09:40:08  _____________________________________________________________________  External Attachment:    Type:   Image     Comment:   External Document

## 2010-02-17 NOTE — Miscellaneous (Signed)
Summary: 1993-2011 [facility]  [phone] [facility]   Imported By: Lester [place] 02/07/2010 09:49:45  _____________________________________________________________________  External Attachment:    Type:   Image     Comment:   External Document

## 2010-02-17 NOTE — Letter (Signed)
Summary: Michelle Nasuti MD  Michelle Nasuti MD   Imported By: Lester [place] 02/07/2010 09:45:02  _____________________________________________________________________  External Attachment:    Type:   Image     Comment:   External Document

## 2010-02-23 NOTE — Progress Notes (Signed)
Summary: rx's   Phone Note Call from Patient Call back at Home Phone [phone]   Caller: Patient Call For: nadel Summary of Call: pt requests new rx's for the following (these were previously prescribed by another dr. in the state that pt formally lived in): CELEXA 20mg  / MEVACOR 20mg  / PRILOSEC 20mg  / and LASIX 40mg . [facility] [address]  Initial call taken by: Tivis Ringer, CNA,  February 15, 2010 1:24 PM  Follow-up for Phone Call        Pls advise if okay to fill these meds for patient or if she should continue to get refills from prescribing doctor in [place].Michel Bickers [facility]  February 15, 2010 2:44 PM  Additional Follow-up for Phone Call Additional follow up Details #1::        that is fine  per mar with #30 and 5 refills.  Additional Follow-up by: Rubye Oaks NP,  February 15, 2010 4:40 PM    Additional Follow-up for Phone Call Additional follow up Details #2::    Rxs were sent to pharm.  Spoke with pt and notified this was done.  Follow-up by: Vernie Murders,  February 15, 2010 4:46 PM  Prescriptions: CELEXA 20 MG TABS (CITALOPRAM HYDROBROMIDE) Take 1 tablet by mouth once a day  #30 x 5   Entered by:   Vernie Murders   Authorized by:   Rubye Oaks NP   Signed by:   Vernie Murders on 02/15/2010   Method used:   Electronically to        [facility] (retail)       [address]       [place], [place]  [address]       Ph: [phone]       Fax: [phone]   RxID:   [id number] PRILOSEC 20 MG CPDR (OMEPRAZOLE) Take 1 tablet by mouth once a day  #30 x 5   Entered by:   Vernie Murders   Authorized by:   Rubye Oaks NP   Signed by:   Vernie Murders on 02/15/2010   Method used:   Electronically to        [facility] (retail)       [address]       [place], [place]  [address]       Ph: [phone]       Fax: [phone]   RxID:   [id number] MEVACOR 20 MG TABS (LOVASTATIN) take one tablet by mouth at bedtime  #30 x 5   Entered by:   Vernie Murders  Authorized by:   Rubye Oaks NP   Signed by:   Vernie Murders on 02/15/2010   Method used:   Electronically to        [facility] (retail)       [address]       [place], [place]  [address]       Ph: [phone]       Fax: [phone]   RxID:   [id number] LASIX 40 MG TABS (FUROSEMIDE) Take 1 tablet by mouth once a day  #30 x 5   Entered by:   Vernie Murders   Authorized by:   Rubye Oaks NP   Signed by:   Vernie Murders on 02/15/2010   Method used:   Electronically to        [facility] (retail)       [address]       [place], [place]  [address]  Ph: [phone]       Fax: [phone]   RxID:   [id number]

## 2010-03-13 ENCOUNTER — Emergency Department (HOSPITAL_COMMUNITY): Payer: Medicare Other

## 2010-03-13 ENCOUNTER — Inpatient Hospital Stay (HOSPITAL_COMMUNITY)
Admission: EM | Admit: 2010-03-13 | Discharge: 2010-03-17 | DRG: 682 | Disposition: A | Discharge status: Home / Self Care | Payer: Medicare Other | Attending: Internal Medicine | Admitting: Internal Medicine

## 2010-03-13 DIAGNOSIS — F329 Major depressive disorder, single episode, unspecified: Secondary | ICD-10-CM | POA: Diagnosis present

## 2010-03-13 DIAGNOSIS — F17200 Nicotine dependence, unspecified, uncomplicated: Secondary | ICD-10-CM | POA: Diagnosis present

## 2010-03-13 DIAGNOSIS — N390 Urinary tract infection, site not specified: Secondary | ICD-10-CM | POA: Diagnosis present

## 2010-03-13 DIAGNOSIS — I509 Heart failure, unspecified: Secondary | ICD-10-CM | POA: Diagnosis present

## 2010-03-13 DIAGNOSIS — G894 Chronic pain syndrome: Secondary | ICD-10-CM | POA: Diagnosis present

## 2010-03-13 DIAGNOSIS — N179 Acute kidney failure, unspecified: Principal | ICD-10-CM | POA: Diagnosis present

## 2010-03-13 DIAGNOSIS — I10 Essential (primary) hypertension: Secondary | ICD-10-CM | POA: Diagnosis present

## 2010-03-13 DIAGNOSIS — I472 Ventricular tachycardia, unspecified: Secondary | ICD-10-CM | POA: Diagnosis present

## 2010-03-13 DIAGNOSIS — F3289 Other specified depressive episodes: Secondary | ICD-10-CM | POA: Diagnosis present

## 2010-03-13 DIAGNOSIS — K2990 Gastroduodenitis, unspecified, without bleeding: Secondary | ICD-10-CM | POA: Diagnosis present

## 2010-03-13 DIAGNOSIS — D638 Anemia in other chronic diseases classified elsewhere: Secondary | ICD-10-CM | POA: Diagnosis present

## 2010-03-13 DIAGNOSIS — T502X5A Adverse effect of carbonic-anhydrase inhibitors, benzothiadiazides and other diuretics, initial encounter: Secondary | ICD-10-CM | POA: Diagnosis present

## 2010-03-13 DIAGNOSIS — I4729 Other ventricular tachycardia: Secondary | ICD-10-CM | POA: Diagnosis present

## 2010-03-13 DIAGNOSIS — I872 Venous insufficiency (chronic) (peripheral): Secondary | ICD-10-CM | POA: Diagnosis present

## 2010-03-13 DIAGNOSIS — M1610 Unilateral primary osteoarthritis, unspecified hip: Secondary | ICD-10-CM | POA: Diagnosis present

## 2010-03-13 DIAGNOSIS — K2970 Gastritis, unspecified, without bleeding: Secondary | ICD-10-CM | POA: Diagnosis present

## 2010-03-13 DIAGNOSIS — M899 Disorder of bone, unspecified: Secondary | ICD-10-CM | POA: Diagnosis present

## 2010-03-13 DIAGNOSIS — I5033 Acute on chronic diastolic (congestive) heart failure: Secondary | ICD-10-CM | POA: Diagnosis not present

## 2010-03-13 DIAGNOSIS — I38 Endocarditis, valve unspecified: Secondary | ICD-10-CM | POA: Diagnosis present

## 2010-03-13 DIAGNOSIS — K449 Diaphragmatic hernia without obstruction or gangrene: Secondary | ICD-10-CM | POA: Diagnosis present

## 2010-03-13 DIAGNOSIS — M169 Osteoarthritis of hip, unspecified: Secondary | ICD-10-CM | POA: Diagnosis present

## 2010-03-13 LAB — DIFFERENTIAL
Basophils Absolute: 0 K/uL (ref 0.0–0.1)
Basophils Relative: 0 % (ref 0–1)
Eosinophils Absolute: 0.2 10*3/uL (ref 0.0–0.7)
Eosinophils Relative: 1 % (ref 0–5)
Lymphocytes Relative: 7 % — ABNORMAL LOW (ref 12–46)
Lymphs Abs: 1.5 K/uL (ref 0.7–4.0)
Monocytes Absolute: 1.5 K/uL — ABNORMAL HIGH (ref 0.1–1.0)
Monocytes Relative: 7 % (ref 3–12)
Neutro Abs: 18.7 K/uL — ABNORMAL HIGH (ref 1.7–7.7)
Neutrophils Relative %: 85 % — ABNORMAL HIGH (ref 43–77)

## 2010-03-13 LAB — COMPREHENSIVE METABOLIC PANEL
ALT: 17 U/L (ref 0–35)
Albumin: 3.1 g/dL — ABNORMAL LOW (ref 3.5–5.2)
Alkaline Phosphatase: 83 U/L (ref 39–117)
BUN: 118 mg/dL — ABNORMAL HIGH (ref 6–23)
Chloride: 91 mEq/L — ABNORMAL LOW (ref 96–112)
Potassium: 4.1 mEq/L (ref 3.5–5.1)
Sodium: 129 mEq/L — ABNORMAL LOW (ref 135–145)
Total Bilirubin: 0.7 mg/dL (ref 0.3–1.2)

## 2010-03-13 LAB — COMPREHENSIVE METABOLIC PANEL WITH GFR
AST: 22 U/L (ref 0–37)
CO2: 25 meq/L (ref 19–32)
Calcium: 9.1 mg/dL (ref 8.4–10.5)
Creatinine, Ser: 2.7 mg/dL — ABNORMAL HIGH (ref 0.4–1.2)
GFR calc Af Amer: 21 mL/min — ABNORMAL LOW (ref >60)
GFR calc non Af Amer: 17 mL/min — ABNORMAL LOW (ref >60)
Glucose, Bld: 158 mg/dL — ABNORMAL HIGH (ref 70–99)
Total Protein: 7.4 g/dL (ref 6.0–8.3)

## 2010-03-13 LAB — URINE MICROSCOPIC-ADD ON

## 2010-03-13 LAB — LACTIC ACID, PLASMA: Lactic Acid, Venous: 1.3 mmol/L (ref 0.5–2.2)

## 2010-03-13 LAB — URINALYSIS, ROUTINE W REFLEX MICROSCOPIC
Bilirubin Urine: NEGATIVE
Glucose, UA: NEGATIVE mg/dL
Ketones, ur: NEGATIVE mg/dL
Nitrite: NEGATIVE
Protein, ur: 30 mg/dL — AB
Specific Gravity, Urine: 1.019 (ref 1.005–1.030)
Urobilinogen, UA: 1 mg/dL (ref 0.0–1.0)
pH: 5 (ref 5.0–8.0)

## 2010-03-13 LAB — CBC
HCT: 31.3 % — ABNORMAL LOW (ref 36.0–46.0)
Hemoglobin: 10 g/dL — ABNORMAL LOW (ref 12.0–15.0)
MCH: 25.3 pg — ABNORMAL LOW (ref 26.0–34.0)
MCHC: 31.9 g/dL (ref 30.0–36.0)
MCV: 79 fL (ref 78.0–100.0)
Platelets: 551 10*3/uL — ABNORMAL HIGH (ref 150–400)
RBC: 3.96 MIL/uL (ref 3.87–5.11)
RDW: 16.2 % — ABNORMAL HIGH (ref 11.5–15.5)
WBC: 21.9 10*3/uL — ABNORMAL HIGH (ref 4.0–10.5)

## 2010-03-13 LAB — POCT CARDIAC MARKERS
CKMB, poc: 7.7 ng/mL (ref 1.0–8.0)
Myoglobin, poc: >500 ng/mL (ref 12–200)
Troponin i, poc: <0.05 ng/mL (ref 0.00–0.09)

## 2010-03-13 LAB — PROCALCITONIN: Procalcitonin: 0.22 ng/mL

## 2010-03-14 LAB — SODIUM, URINE, RANDOM: Sodium, Ur: 32 mEq/L

## 2010-03-14 LAB — BASIC METABOLIC PANEL
Calcium: 8.7 mg/dL (ref 8.4–10.5)
GFR calc non Af Amer: 30 mL/min — ABNORMAL LOW (ref >60)
Potassium: 4 mEq/L (ref 3.5–5.1)
Sodium: 135 mEq/L (ref 135–145)

## 2010-03-14 LAB — CBC
Platelets: 444 10*3/uL — ABNORMAL HIGH (ref 150–400)
RBC: 3.39 MIL/uL — ABNORMAL LOW (ref 3.87–5.11)
RDW: 16.3 % — ABNORMAL HIGH (ref 11.5–15.5)
WBC: 18.9 10*3/uL — ABNORMAL HIGH (ref 4.0–10.5)

## 2010-03-14 LAB — BRAIN NATRIURETIC PEPTIDE: Pro B Natriuretic peptide (BNP): 194 pg/mL — ABNORMAL HIGH (ref 0.0–100.0)

## 2010-03-14 LAB — CREATININE, URINE, RANDOM: Creatinine, Urine: 51.5 mg/dL

## 2010-03-15 LAB — BASIC METABOLIC PANEL
CO2: 28 mEq/L (ref 19–32)
Calcium: 9 mg/dL (ref 8.4–10.5)
Creatinine, Ser: 1.05 mg/dL (ref 0.4–1.2)
GFR calc Af Amer: >60 mL/min (ref >60)
Sodium: 143 mEq/L (ref 135–145)

## 2010-03-15 LAB — CBC
Hemoglobin: 8.8 g/dL — ABNORMAL LOW (ref 12.0–15.0)
MCH: 24.4 pg — ABNORMAL LOW (ref 26.0–34.0)
MCHC: 29.9 g/dL — ABNORMAL LOW (ref 30.0–36.0)
Platelets: 446 10*3/uL — ABNORMAL HIGH (ref 150–400)
RBC: 3.61 MIL/uL — ABNORMAL LOW (ref 3.87–5.11)

## 2010-03-15 LAB — URINE CULTURE
Culture  Setup Time: 201203120936
Special Requests: NEGATIVE

## 2010-03-15 LAB — IRON AND TIBC
Saturation Ratios: 7 % — ABNORMAL LOW (ref 20–55)
UIBC: 140 ug/dL

## 2010-03-15 LAB — FOLATE: Folate: 11.5 ng/mL

## 2010-03-15 LAB — FERRITIN: Ferritin: 545 ng/mL — ABNORMAL HIGH (ref 10–291)

## 2010-03-16 LAB — COMPREHENSIVE METABOLIC PANEL
ALT: 10 U/L (ref 0–35)
ALT: 11 U/L (ref 0–35)
ALT: 13 U/L (ref 0–35)
AST: 19 U/L (ref 0–37)
Albumin: 2.5 g/dL — ABNORMAL LOW (ref 3.5–5.2)
Albumin: 2.7 g/dL — ABNORMAL LOW (ref 3.5–5.2)
Albumin: 3.2 g/dL — ABNORMAL LOW (ref 3.5–5.2)
Alkaline Phosphatase: 61 U/L (ref 39–117)
Alkaline Phosphatase: 62 U/L (ref 39–117)
Alkaline Phosphatase: 63 U/L (ref 39–117)
Alkaline Phosphatase: 69 U/L (ref 39–117)
BUN: 10 mg/dL (ref 6–23)
BUN: 12 mg/dL (ref 6–23)
CO2: 33 mEq/L — ABNORMAL HIGH (ref 19–32)
Calcium: 9.1 mg/dL (ref 8.4–10.5)
Calcium: 9.1 mg/dL (ref 8.4–10.5)
Calcium: 9.2 mg/dL (ref 8.4–10.5)
Chloride: 102 mEq/L (ref 96–112)
Chloride: 105 mEq/L (ref 96–112)
Creatinine, Ser: 0.8 mg/dL (ref 0.4–1.2)
GFR calc Af Amer: >60 mL/min (ref >60)
GFR calc non Af Amer: >60 mL/min (ref >60)
Glucose, Bld: 108 mg/dL — ABNORMAL HIGH (ref 70–99)
Glucose, Bld: 116 mg/dL — ABNORMAL HIGH (ref 70–99)
Potassium: 3.5 mEq/L (ref 3.5–5.1)
Potassium: 3.5 mEq/L (ref 3.5–5.1)
Potassium: 3.9 mEq/L (ref 3.5–5.1)
Sodium: 143 mEq/L (ref 135–145)
Sodium: 143 mEq/L (ref 135–145)
Sodium: 144 mEq/L (ref 135–145)
Total Bilirubin: 0.3 mg/dL (ref 0.3–1.2)
Total Bilirubin: 0.6 mg/dL (ref 0.3–1.2)
Total Protein: 5 g/dL — ABNORMAL LOW (ref 6.0–8.3)
Total Protein: 5.2 g/dL — ABNORMAL LOW (ref 6.0–8.3)
Total Protein: 5.6 g/dL — ABNORMAL LOW (ref 6.0–8.3)

## 2010-03-16 LAB — CBC
HCT: 33.7 % — ABNORMAL LOW (ref 36.0–46.0)
HCT: 35.2 % — ABNORMAL LOW (ref 36.0–46.0)
Hemoglobin: 11.7 g/dL — ABNORMAL LOW (ref 12.0–15.0)
Hemoglobin: 8.6 g/dL — ABNORMAL LOW (ref 12.0–15.0)
MCH: 24.3 pg — ABNORMAL LOW (ref 26.0–34.0)
MCH: 27.7 pg (ref 26.0–34.0)
MCHC: 33 g/dL (ref 30.0–36.0)
MCHC: 33.2 g/dL (ref 30.0–36.0)
MCHC: 33.4 g/dL (ref 30.0–36.0)
MCV: 82.9 fL (ref 78.0–100.0)
Platelets: 213 10*3/uL (ref 150–400)
Platelets: 235 10*3/uL (ref 150–400)
Platelets: 247 10*3/uL (ref 150–400)
Platelets: 277 10*3/uL (ref 150–400)
Platelets: 435 10*3/uL — ABNORMAL HIGH (ref 150–400)
RBC: 3.54 MIL/uL — ABNORMAL LOW (ref 3.87–5.11)
RBC: 4.23 MIL/uL (ref 3.87–5.11)
RDW: 14.7 % (ref 11.5–15.5)
RDW: 15 % (ref 11.5–15.5)
RDW: 15 % (ref 11.5–15.5)
RDW: 15.1 % (ref 11.5–15.5)
WBC: 10.3 10*3/uL (ref 4.0–10.5)
WBC: 11.5 10*3/uL — ABNORMAL HIGH (ref 4.0–10.5)
WBC: 12.2 10*3/uL — ABNORMAL HIGH (ref 4.0–10.5)

## 2010-03-16 LAB — BASIC METABOLIC PANEL
BUN: 11 mg/dL (ref 6–23)
CO2: 31 mEq/L (ref 19–32)
Calcium: 10 mg/dL (ref 8.4–10.5)
Chloride: 110 mEq/L (ref 96–112)
GFR calc Af Amer: >60 mL/min (ref >60)
GFR calc non Af Amer: >60 mL/min (ref >60)
Glucose, Bld: 110 mg/dL — ABNORMAL HIGH (ref 70–99)
Potassium: 4.4 mEq/L (ref 3.5–5.1)
Sodium: 144 mEq/L (ref 135–145)

## 2010-03-16 LAB — PHOSPHORUS: Phosphorus: 4.1 mg/dL (ref 2.3–4.6)

## 2010-03-16 LAB — DIFFERENTIAL
Basophils Absolute: 0 10*3/uL (ref 0.0–0.1)
Basophils Relative: 0 % (ref 0–1)
Neutro Abs: 11.6 10*3/uL — ABNORMAL HIGH (ref 1.7–7.7)
Neutrophils Relative %: 88 % — ABNORMAL HIGH (ref 43–77)

## 2010-03-16 LAB — PROTIME-INR: INR: 1.08 (ref 0.00–1.49)

## 2010-03-17 ENCOUNTER — Other Ambulatory Visit: Payer: Self-pay | Admitting: Internal Medicine

## 2010-03-17 DIAGNOSIS — K2990 Gastroduodenitis, unspecified, without bleeding: Secondary | ICD-10-CM

## 2010-03-17 DIAGNOSIS — K2970 Gastritis, unspecified, without bleeding: Secondary | ICD-10-CM

## 2010-03-17 DIAGNOSIS — D509 Iron deficiency anemia, unspecified: Secondary | ICD-10-CM

## 2010-03-17 LAB — BASIC METABOLIC PANEL
Calcium: 9.2 mg/dL (ref 8.4–10.5)
GFR calc Af Amer: >60 mL/min (ref >60)
GFR calc non Af Amer: >60 mL/min (ref >60)
Sodium: 142 mEq/L (ref 135–145)

## 2010-03-17 LAB — CBC
HCT: 30.5 % — ABNORMAL LOW (ref 36.0–46.0)
Hemoglobin: 9.1 g/dL — ABNORMAL LOW (ref 12.0–15.0)
MCH: 24.5 pg — ABNORMAL LOW (ref 26.0–34.0)
MCV: 82 fL (ref 78.0–100.0)
Platelets: 435 10*3/uL — ABNORMAL HIGH (ref 150–400)
RBC: 3.72 MIL/uL — ABNORMAL LOW (ref 3.87–5.11)
WBC: 11.2 10*3/uL — ABNORMAL HIGH (ref 4.0–10.5)

## 2010-03-18 ENCOUNTER — Telehealth: Payer: Self-pay | Admitting: Pulmonary Disease

## 2010-03-19 LAB — CULTURE, BLOOD (ROUTINE X 2)
Culture  Setup Time: 201203112039
Culture  Setup Time: 201203112039
Culture: NO GROWTH
Culture: NO GROWTH

## 2010-03-21 ENCOUNTER — Encounter: Payer: Self-pay | Admitting: Pulmonary Disease

## 2010-03-21 ENCOUNTER — Telehealth (INDEPENDENT_AMBULATORY_CARE_PROVIDER_SITE_OTHER): Payer: Self-pay | Admitting: *Deleted

## 2010-03-22 ENCOUNTER — Other Ambulatory Visit (HOSPITAL_COMMUNITY): Payer: Medicare Other

## 2010-03-22 ENCOUNTER — Telehealth: Payer: Self-pay | Admitting: Pulmonary Disease

## 2010-03-22 DIAGNOSIS — E7800 Pure hypercholesterolemia, unspecified: Secondary | ICD-10-CM

## 2010-03-22 DIAGNOSIS — D649 Anemia, unspecified: Secondary | ICD-10-CM

## 2010-03-22 DIAGNOSIS — R748 Abnormal levels of other serum enzymes: Secondary | ICD-10-CM

## 2010-03-22 DIAGNOSIS — I10 Essential (primary) hypertension: Secondary | ICD-10-CM

## 2010-03-22 DIAGNOSIS — M899 Disorder of bone, unspecified: Secondary | ICD-10-CM

## 2010-03-22 DIAGNOSIS — E039 Hypothyroidism, unspecified: Secondary | ICD-10-CM

## 2010-03-22 NOTE — Progress Notes (Signed)
Summary: HFU w/ sn  Phone Note Call from Patient Call back at Home Phone [phone]   Caller: Daughter-in-law Katie Galvan Call For: Katie Galvan Summary of Call: needs a HFU w/ dr [name] in 2 wks. call home # or [phone] Initial call taken by: Tivis Ringer, CNA,  March 18, 2010 10:18 AM  Follow-up for Phone Call        [name] please if pt can be worked in or if they can see TP for HFU. Thanks [name]  March 18, 2010 10:31 AM   Pt's son Katie Galvan called and stated that his mom is scheduled to have hip surgery within the next 2 wks and wants to know if she stills needs to hava a hfu appt pls advise can be reached at [phone].Darletta Moll  March 18, 2010 11:51 AM   Additional Follow-up for Phone Call Additional follow up Details #1::        called and spoke with Katie---pts daughter in law and she is aware of appt for 3-22 at 3pm for HFU [name] CMA  March 18, 2010 3:42 PM

## 2010-03-22 NOTE — Telephone Encounter (Signed)
May come in for fasting labs prior  To ov  Cbc/diff, bmet, hepatic, lipid, tsh , vita. D

## 2010-03-22 NOTE — Discharge Summary (Signed)
NAME:  Katie Galvan, Katie Galvan            ACCOUNT NO.:  [id number]  MEDICAL RECORD NO.:  [id number]           Galvan TYPE:  I  LOCATION:  1424                         FACILITY:  [facility]  PHYSICIAN:  [name], M.D.   DATE OF BIRTH:  06/14/1927  DATE OF ADMISSION:  03/13/2010 DATE OF DISCHARGE:  03/17/2010                              DISCHARGE SUMMARY   PRIMARY CARE PHYSICIAN:  Lonzo Cloud. [name], MD  GASTROENTEROLOGIST:  Hedwig Morton. Juanda Chance, MD  PRIMARY DISCHARGE DIAGNOSES: 1. Acute kidney injury. 2. Presumed urinary tract infection. 3. Normocytic anemia. 4. Acute on chronic diastolic congestive heart failure. 5. Nonsustained ventricular tachycardia. 6. Mild antral gastritis, biopsies pending, status post upper     endoscopy.  SECONDARY DIAGNOSES: 1. Hypertension. 2. Chronic pain syndrome. 3. Osteoarthritis. 4. Chronic lower extremity edema/venous insufficiency. 5. Valvular heart disease status post 2-dimensional echocardiogramwith preserved ejection fraction. 6. History of dyslipidemia. 7. Osteopenia. 8. Depression. 9. Recent wrist fracture. 10.Hiatal hernia.  DISCHARGE MEDICATIONS: 1. Carafate 1 g p.o. b.i.d. 2. Aspirin enteric-coated 81 mg p.o. daily. 3. Calcium citrate/vitamin D 600/400 two tablets p.o. daily. 4. Celexa 20 mg p.o. daily. 5. Diltiazem CD 240 mg p.o. b.i.d. 6. Enalapril 20 mg p.o. daily. 7. Furosemide 40 mg p.o. daily. 8. Loratadine 10 mg p.o. daily. 9. Mevacor 20 mg p.o. q.h.s. 10.MS Contin 45 mg p.o. b.i.d. 11.Multivitamin 1 tablet p.o. daily. 12.Prilosec 20 mg p.o. daily. 13.Vitamin D3 1000 units 2 tablets p.o. daily. 14.Voltaren topical solution one application t.i.d.  CONSULTATIONS:  Dr. Lina Sar of Gastroenterology.  BRIEF ADMISSION HPI:  Katie Galvan is an 77 year old female who presented to Katie hospital with a chief complaint of dizziness in Katie setting of being on high-dose morphine for control of hip pain with Katie ultimate plan to have  hip surgery to treat end-stage osteoarthritis.  Upon initial evaluation in Katie emergency department, Katie Galvan was found to be in acute renal failure and subsequently was referred to Katie hospitalist service for further inpatient evaluation and treatment.  For Katie full details, please see Katie dictated report done by Dr. Sharl Ma.  PROCEDURES AND DIAGNOSTIC STUDIES: 1. Chest x-ray on March 13, 2010, showed interval development of     pulmonary vascular congestion/mild pulmonary edema. 2. CT scan of Katie head on March 13, 2010, showed mild chronic small     vessel ischemic changes with no acute abnormalities. 3. Two-dimensional echocardiogram on March 14, 2010, showed mild LVH     with normal systolic function and ejection fraction estimated 55-     65%.  There was grade 1 diastolic dysfunction and aortic sclerosis     as well as mitral valve that was severely calcified at Katie annulus.     Prolapse could not be excluded and vegetation could not be     excluded. 4. Upper endoscopy performed by Dr. Lina Sar on March 16, 2010,     showed mild gastritis with scattered erosions in Katie antrum but no     active bleeding or blood in Katie stomach.  Biopsies were obtained     and sent to pathology.  Biopsy results are pending at Katie  time of     this dictation.  A hiatal hernia was also found.  Note:     Colonoscopy was not performed due to Katie fact that she recently had     one in [place] and Katie gastroenterologist deferred this while     awaiting formal report from [place].  DISCHARGE LABORATORY VALUES:  BNP was 636.  Sodium was 142, potassium 4.4, chloride 106, bicarb 30, BUN 15, creatinine 0.72, glucose 91, calcium 9.2.  White blood cell count was 11.2, hemoglobin 9.1, hematocrit 30.5, platelets 435.  HOSPITAL COURSE BY PROBLEM: 1. Acute kidney injury:  This was felt to be multifactorial and     related to ongoing diuretic and ACE inhibitor use in Katie setting of     decreased p.o. intake  from somnolence induced by high doses of pain     medications.  Katie Galvan's diuretics and ACE inhibitors were held     and she was gently hydrated with complete resolution of her acute     kidney injury. 2. Presumed urinary tract infection:  Katie Galvan has completed 4 days     of therapy with Rocephin which should be adequate.  Katie Galvan did     have urine cultures done on admission that did not show any     evidence of urinary tract infection but it is unclear if she     received antibiotics prior to Katie collection of this urine     specimen. 3. Anemia of chronic disease plus or minus GI losses:  Katie Galvan did     undergo upper endoscopy with Katie findings of mild antral gastritis.     She was placed on Carafate and a proton pump inhibitor.  She will     need follow up of her gastric biopsies.  Colonoscopy was not     performed as she had this recently done in [place] and these     results can be followed up on as an outpatient.  Her hemoglobin was     stable upon discharge. 4. Mild antral gastritis:  Katie Galvan will need follow up with regard     to her biopsy results.  Otherwise, she is being discharged on     Carafate and proton pump inhibitor. 5. Chronic diastolic congestive heart failure:  Katie Galvan's BNP was     mildly elevated and chest radiographs were consistent with mild     vascular congestion.  Nevertheless, Katie Galvan did have her Lasix     held while her renal function stabilized.  At this point, she can     safely resume Lasix therapy at discharge. 6. Nonsustained ventricular tachycardia:  Katie Galvan's electrolytes     were monitored closely and she had no recurrent events with normal     electrolytes.  Katie Galvan's other chronic medical problems have remained stable throughout her hospital stay.  DISPOSITION:  Katie Galvan is medically stable and will be discharged home.  CONDITION ON DISCHARGE:  Improved.  Time spent coordinating care for discharge  and discharge instructions equals 40 minutes.  DISCHARGE DIET:  Heart healthy, no added salt.     [name], M.D.     CR/MEDQ  D:  03/17/2010  T:  03/17/2010  Job:  [id number]  cc:   Lonzo Cloud. [name], MD [address] [place] [address]  Hedwig Morton. Juanda Chance, MD [address] [place] [address]  Electronically Signed by [name] M.D. on 03/22/2010 03:25:16 PM

## 2010-03-22 NOTE — Telephone Encounter (Signed)
Spoke with pt son and advised that labs have been entered. Carron Curie, MA

## 2010-03-22 NOTE — Telephone Encounter (Signed)
Pt has a 3 month follow-up set for tomorrow and wants to know if they need any fasting labs done prior to appointment. Please advise. Carron Curie, MA

## 2010-03-22 NOTE — Procedures (Addendum)
Summary: Upper Endoscopy  Patient: Livier Hendel Note: All result statuses are Final unless otherwise noted.  Tests: (1) Upper Endoscopy (EGD)   EGD Upper Endoscopy       DONE     [facility]     [address] [place], [place]  [address]          ENDOSCOPY PROCEDURE REPORT          PATIENT:  Serra, Younan  MR#:  [id number]     BIRTHDATE:  1927/11/28, 82 yrs. old  GENDER:  female          ENDOSCOPIST:  Hedwig Morton. Juanda Chance, MD     Referred by:  [name], M.D.          PROCEDURE DATE:  03/17/2010     PROCEDURE:  EGD with biopsy, [id number]     ASA CLASS:  Class II     INDICATIONS:  anemia drop in Hgb 11.0 to 8.6, heme negative stool,     pt has been on Meloxican          MEDICATIONS:   Versed 4 mg, Fentanyl 50 mcg, Benadryl 25 mg     TOPICAL ANESTHETIC:  Cetacaine Spray          DESCRIPTION OF PROCEDURE:   After the risks benefits and     alternatives of the procedure were thoroughly explained, informed     consent was obtained.  The  endoscope was introduced through the     mouth and advanced to the second portion of the duodenum, without     limitations.  The instrument was slowly withdrawn as the mucosa     was fully examined.     <<PROCEDUREIMAGES>>          Mild gastritis was found. scattered erosions in the antrum, no     active bleeding or blood in the stomach With standard forceps, a     biopsy was obtained and sent to pathology (see image2, image1, and     image3).  A hiatal hernia was found (see image8, image7, and     image6). 2 cm h hernia  Otherwise the examination was normal (see     image4).    Retroflexed views revealed no abnormalities.    The     scope was then withdrawn from the patient and the procedure     completed.          COMPLICATIONS:  None          ENDOSCOPIC IMPRESSION:     1) Mild gastritis     2) Hiatal hernia     3) Otherwise normal examination     RECOMMENDATIONS:     1) Await biopsy results     continue PPI,  advance diet, follow H/H          REPEAT EXAM:  In 0 year(s) for.          ______________________________     Hedwig Morton. Juanda Chance, MD          CC:          n.     eSIGNED:   Hedwig Morton. Dessa Ledee at 03/17/2010 09:38 AM          Esaw Dace, [id number]  Note: An exclamation mark (!) indicates a result that was not dispersed into the flowsheet. Document Creation Date: 03/17/2010 9:38 AM _______________________________________________________________________  (1) Order result status: Final Collection or observation date-time: 03/17/2010 09:29 Requested  date-time:  Receipt date-time:  Reported date-time:  Referring Physician:   Ordering Physician: Lina Sar [phone]) Specimen Source:  Source: Launa Grill Order Number: [id number] Lab site:

## 2010-03-24 ENCOUNTER — Encounter: Payer: Self-pay | Admitting: Pulmonary Disease

## 2010-03-24 ENCOUNTER — Other Ambulatory Visit: Payer: Medicare Other

## 2010-03-24 ENCOUNTER — Ambulatory Visit (INDEPENDENT_AMBULATORY_CARE_PROVIDER_SITE_OTHER): Payer: Medicare Other | Admitting: Pulmonary Disease

## 2010-03-24 ENCOUNTER — Other Ambulatory Visit: Payer: Self-pay | Admitting: Pulmonary Disease

## 2010-03-24 DIAGNOSIS — M899 Disorder of bone, unspecified: Secondary | ICD-10-CM

## 2010-03-24 DIAGNOSIS — I872 Venous insufficiency (chronic) (peripheral): Secondary | ICD-10-CM

## 2010-03-24 DIAGNOSIS — J986 Disorders of diaphragm: Secondary | ICD-10-CM

## 2010-03-24 DIAGNOSIS — E039 Hypothyroidism, unspecified: Secondary | ICD-10-CM

## 2010-03-24 DIAGNOSIS — D649 Anemia, unspecified: Secondary | ICD-10-CM

## 2010-03-24 DIAGNOSIS — R748 Abnormal levels of other serum enzymes: Secondary | ICD-10-CM

## 2010-03-24 DIAGNOSIS — N390 Urinary tract infection, site not specified: Secondary | ICD-10-CM

## 2010-03-24 DIAGNOSIS — E7800 Pure hypercholesterolemia, unspecified: Secondary | ICD-10-CM

## 2010-03-24 DIAGNOSIS — G894 Chronic pain syndrome: Secondary | ICD-10-CM

## 2010-03-24 DIAGNOSIS — D126 Benign neoplasm of colon, unspecified: Secondary | ICD-10-CM

## 2010-03-24 DIAGNOSIS — I10 Essential (primary) hypertension: Secondary | ICD-10-CM

## 2010-03-24 DIAGNOSIS — M949 Disorder of cartilage, unspecified: Secondary | ICD-10-CM

## 2010-03-24 DIAGNOSIS — I38 Endocarditis, valve unspecified: Secondary | ICD-10-CM

## 2010-03-24 DIAGNOSIS — M1990 Unspecified osteoarthritis, unspecified site: Secondary | ICD-10-CM

## 2010-03-24 NOTE — Assessment & Plan Note (Signed)
She has severe DJD in right hip & was sched for THR by DrBlackman> had to be delayed due to recent hosp... OK to resched this surg & in the meanwhile continue rx w/ MSContin+ add Mobic as needed (watch renal funct).Marland KitchenMarland Kitchen

## 2010-03-24 NOTE — Patient Instructions (Signed)
Donita, you look great! Continue your current meds the same & OK to take one MOBIC/ Meloxicam daily in addition to your MS pain meds... I will call DrBlackman w/ the OK for the hip surg... Remember> no salt/ sodium, elevate the legs, wear the support hose, and continue the Lasix/ Furosemide 40mg  per day... Call for any questions... We will plan a recheck here after your hip surg rehab.Marland KitchenMarland Kitchen

## 2010-03-24 NOTE — Progress Notes (Signed)
Subjective:    Patient ID: Katie Galvan, female    DOB: [birth year], 77 y.o.   MRN: [id number]  HPI 77 y/o WF, mother of Katie Galvan, who has moved here from [facility]...  she has multiple medical problems including chronically elev right hemidiaph;  HBP;  Diastolic dysfunction & mild PulmHTN on 2DEcho;  VI & Edema;  Hx dyspagia & gastritis;  Divertics & ?colon polyp;  Hx UTI & renal failure from dehydration 3/12;  Severe DJD esp right hip/ ?CPPD/ LBP/ Chr Pain Syndrome on MS Contin;  Osteopenia;  Anemia...  ~  December 02, 2009:  New patient evaluation- she was hosp 11/3-7/11 after fall at home w/ comminuted left wrist fx & had surg by DrKKuzma > SEE INITIAL PROBLEM LIST & DISCUSSION BELOW:   ~  January 17, 2010:  She has severe DJD (esp right hip), left wrist fx & surg, +LBP, etc> on MSContin per LMD in [place] that we have been trying to wean- now down from 90mg Bid to 60mg Bid (she had some withdrawal sweats as she weaned but now improved)... family notes she is "more with it"... she had f/u Ortho, DrBlackman, & given injection in knees & hip (helped some), started on MOBIC 15mg /d as well, he is considering right THR w/ ant approach... still f/u w/ DrKuzma for wrist, getting PT/ rehab weekly, improved... she will be moving to [place] independ living... we discussed slowly decr MS further 60-30, 30-30, and f/u 60mo.  ~  March 24, 2010:  She was hosp 3/11 - 03/17/10 w/ UTI, dehydration & renal insuffic> cults were neg & PCT=0.22, but active sediment & given IV Rocephin in hosp;  BUN 118 =>15 & Creat 2.7 =>0.72 w/ hydration but BNP incr to 636;  Hg 10.0 =>9.1 & Fe=11 (7%sat), B12=478;  EGD by DrDBrodie showed HH & gastritis w/ neg HPylori, Rx w/ Prilosec & Carafate transiently;  Currently she feels better & looks great> right hip surg postponed & anxious to resched due to her severe pain (on MS 30+15 Bid & still in pain, wants to add back Mobic-OK);  We wanted to check labs today but  they couldn't get blood ==> reviewed all her meds, add FeSO4/ VitC, add Mobic Prn, OK for surg (check labs in hosp preop)...    HBP>  On ASA & Diltiazem 240mg  bid;  BP= 122/66 today & she denies CP, palpit, ch in SOB;  She does have incr edema since hydrated in hosp & BNP was up to 636 by disch (we discussed no salt, contin Lasix40mg /d);      Cardiac>  EKG essent WNL & 2DEcho showed mild LVH, norm sys funct w/ EF=55-65%, gr1 DD, mild MR w/ calcif annulus, Ao sclerosis w/ triv AI, mod PulmHTN w/ PAsys=46.   Review of Systems   Constitutional:  Denies F/C/S, anorexia, unexpected weight change. HEENT:  No HA, visual changes, earache, nasal symptoms, sore throat, hoarseness. Resp:  No cough, sputum, hemoptysis; no SOB, tightness, wheezing. Cardio:  No CP, palpit, orthopnea;  +edema & DOE but limited mobility. GI:  Denies N/V/D, tends toward constip due to narcotics; swallowing OK & denies reflux or abd pain.  GU:  No dysuria, freq, urgency, hematuria, or flank pain. MS:  Severe DJD esp right hip w/ pain & decr ROM etc Neuro:  No tremors, seizures, dizziness, syncope; +weakness & multifactorial gait abn. Skin:  No suspicious lesions or skin rash. Heme:  No adenopathy, bruising, bleeding. Psyche: Denies confusion, sleep disturbance, hallucinations; +anxiety &  situational depression.    Objective:   Physical Exam   WD, WN, 77 y/o WF in mild distress from pain (right hip)... Vital Signs:  Reviewed... General:  Alert & oriented; pleasant & cooperative... HEENT:  [place]/AT, EOM-wnl, PERRLA, EACs-clear, TMs-wnl, NOSE-clear, THROAT-clear & wnl. Neck:  Supple w/ decr ROM; no JVD; normal carotid impulses w/o bruits; no thyromegaly or nodules palpated; no lymphadenopathy. Chest:  Clear to P & A; without wheezes/ rales/ or rhonchi heard... Heart:  Regular Rhythm; norm S1 & S2 without murmurs/ rubs/ or gallops detected... Abdomen:  Soft & nontender; normal bowel sounds; no organomegaly or masses  palpated... Ext:  decrROM; +deformities & mod arthritic changes; no varicose veins, +venous insuffic & chr edema;  Pulses intact w/o bruits... Neuro:  CNs intact;  No focal neuro deficits, in wheelchair & painful standing & walking... Derm:  No lesions noted; no rash etc... Lymph:  No cervical, supraclavicular, axillary, or inguinal adenopathy palpated...    Assessment & Plan:

## 2010-03-24 NOTE — Assessment & Plan Note (Signed)
She had worsening anemia & Fe defic w/ EGD showing gastitis as noted, HPylori neg;  Plan to start FeSO4 w/ VitC500 & she will need f/u labs when able to draw.Marland KitchenMarland Kitchen

## 2010-03-24 NOTE — Assessment & Plan Note (Signed)
BP controlled on the Dilacor 240mg  bid... Continue same.

## 2010-03-24 NOTE — Assessment & Plan Note (Signed)
She has severe chr pain in right hip from the DJD>  On MSContin 30+15 bid & still hurting;  She wants to use her Mobic- OK & watch renal etc..Marland Kitchen

## 2010-03-24 NOTE — Assessment & Plan Note (Signed)
She was adm 3/12 w/ UTI, dehydration, renal insuffic & improved towards baseline w/ iv hydration & rochephin.Marland KitchenMarland Kitchen

## 2010-03-24 NOTE — Assessment & Plan Note (Signed)
Persistant VI w/ edema> we reviewed no salt, elevation, TED hose, & continued Lasix 40mg /d.Marland KitchenMarland Kitchen

## 2010-03-29 NOTE — H&P (Signed)
NAME:  Katie Galvan, Katie Galvan            ACCOUNT NO.:  [id number]  MEDICAL RECORD NO.:  [id number]           PATIENT TYPE:  E  LOCATION:  WLED                         FACILITY:  [facility]  PHYSICIAN:  Mauro Kaufmann, MD         DATE OF BIRTH:  04-25-1927  DATE OF ADMISSION:  03/13/2010 DATE OF DISCHARGE:                             HISTORY & PHYSICAL   PRIMARY CARE PHYSICIAN:  Lonzo Cloud. [name], MD  CHIEF COMPLAINT:  Dizziness.  HISTORY OF PRESENT ILLNESS:  An 77 year old female who was brought to the hospital as the patient was feeling very dizzy.  The patient has been having dizziness over the past few days.  The patient has been on high dose of morphine as the patient has severe osteoarthritis of the right hip and is scheduled to go for the hip surgery as per Dr. Magnus Ivan this month.  There has been no other symptoms.  She has not had nausea, vomiting, or diarrhea.  No abdominal pain.  No dysuria, urgency, or frequency of urination.  No chest pain or shortness of breath.  The patient did have weakness going on for the past few days though there was no weakness of the right upper or right lower extremities.  The patient did have confusion episodes but did not have any slurred speech. The patient has been on high dose of Lasix 40 mg p.o. daily as per her primary care physician, also has been on Cardizem 240 mg twice a day for hypertension which was prescribed by her primary care physician at [place].  The patient does not know whether she has history of congestive heart failure and there is no echocardiogram done though in November 2011, the patient was discharged with a diagnosis of congestive heart failure.  PAST MEDICAL HISTORY: 1. Significant for severe osteoarthritis of the right hip. 2. Anemia. 3. GERD. 4. Sinusitis. 5. Hypertension. 6. Depression. 7. Chronic pain.  ALLERGIES:  The patient has allergy to SULFA.  SOCIAL HISTORY:  The patient does not smoke cigarettes.  There is  no history of alcohol abuse.  No history of illicit drug abuse.  REVIEW OF SYSTEMS:  HEENT:  There is no headache, no blurred vision, no runny nose, no sore throat.  NECK:  No history of thyroid problems. CHEST:  No history of COPD or emphysema.  HEART:  Questionable history of CHF.  GI:  No nausea, vomiting, or diarrhea.  GENITOURINARY: There is no dysuria, urgency, frequency of urination.  NEUROLOGICAL:  The patient had no stroke or TIA in the past.  PHYSICAL EXAMINATION:  VITAL SIGNS:  The patient's blood pressure at time of time of presentation was 84/56 which improved with IV fluids. At this time, the blood pressure is 114/63, temp is 98.0, pulse 75, respirations 21. HEENT:  Head is atraumatic, normocephalic.  Eyes, extraocular muscles are intact.  Oral mucosa is mildly dry. NECK:  Supple. CHEST:  Clear to auscultation bilaterally.  No wheezing, no crackles. HEART:  S1 and S2 regular.  There is grade 4/6 systolic murmur auscultated at the mitral aortic and pulmonic areas. ABDOMEN:  Soft, nontender.  No hepatosplenomegaly.EXTREMITIES:  There is 1+ edema bilaterally noted in the lower extremities. NEUROLOGIC:  Cranial nerves II-XII grossly intact.  Motor strength is 5/5 in both upper and lower extremity.  Sensations are intact and there is no focal deficit noted at this time.  PERTINENT IMAGING STUDIES DONE TODAY:  Chest x-ray on March 13, 2010, showed interval development of pulmonary vascular congestion and mild pulmonary edema.  CT of the head without contrast showed mild chronic small-vessel ischemic changes.  No acute abnormalities.  PERTINENT LABS:  Shows CK-MB of 7.7, troponin is less than 0.05, myoglobin more than 500.  CMP shows sodium 129, potassium 4.0, chloride is 91, CO2 25, BUN 118, creatinine 2.70, glucose is 158.  Urinalysis shows large leukocytes.  Lactic acid is 1.3.  Procalcitonin 0.22.  ASSESSMENT: 1. Acute renal failure. 2. Urinary tract infection. 3.  Questionable congestive heart failure. 4. Severe osteoarthritis. 5. Depression.  PLAN: 1. Acute renal failure.  The patient has been on Lasix, enalapril,     high dose of Cardizem of 40 mg p.o. daily and now has developed     acute renal failure due to the hypotension and dehydration, so we     are going to stop the Lasix and enalapril and Cardizem at this time     and also stop the patient's meloxicam.  The patient was given     gentle IV fluids at 60 mL/hour.  As we are not sure of the     patient's ejection fraction, we will also get 2D echocardiogram.     We will follow the patient's BMET in the morning. 2. UTI.  The white count is 21,000 and the patient also has a large     leukocytes in the UA, so she will be started on IV Rocephin.  The     urine culture and sensitivity will be sent.  We will also obtain 2     sets of blood cultures. 3. Questionable CHF.  I am going to obtain a BNP and 2D echocardiogram     to assess the patient's cardiac function. 4. Severe osteoarthritis.  The patient has been on chronic high-dose     morphine which has been tapered down, currently she is on 45 mg of     morphine b.i.d. and she will be continued on that. 5. DVT prophylaxis with SCDs.     Mauro Kaufmann, MD     GL/MEDQ  D:  03/13/2010  T:  03/13/2010  Job:  [id number]  cc:   Lonzo Cloud. [name], MD  Electronically Signed by Mauro Kaufmann  on 03/29/2010 09:45:14 AM

## 2010-03-30 ENCOUNTER — Other Ambulatory Visit (HOSPITAL_COMMUNITY): Payer: Medicare Other

## 2010-03-31 NOTE — Progress Notes (Signed)
Summary: wants to know if they should reschedule her hip surgery  Phone Note Call from Patient   Caller: SON MICHAEL Call For: NADEL Summary of Call: Patients son phoned stated that his mother is scheduled for right hip surgery on the 23rd. He wanted to know that with her recent hospitalization if they should postpone this or go ahead with it as scheduled. He can be reached at [phone] Initial call taken by: Vedia Coffer,  March 21, 2010 8:56 AM  Follow-up for Phone Call        SN---pt has appt with you on 3-22  at 3pm and is scheduled to have hip surgery on 3-23----should they reschedule this surgery or keep it for that day?  please advise. thanks [name] CMA  March 21, 2010 11:00 AM   Additional Follow-up for Phone Call Additional follow up Details #1::        per SN----SN feels that pt would do better with some recoup time before major surgery---and postpone the surgery for a later date.  thanks Renaldo Fiddler [facility]  March 21, 2010 2:44 PM   lmomtcb x1 for Rhoderick Moody  March 21, 2010 2:56 PM   Casimiro Needle called back and I informed him of SN recs and he verbalized understanding and will discuss with family [name]  March 21, 2010 3:03 PM

## 2010-04-01 ENCOUNTER — Other Ambulatory Visit (HOSPITAL_COMMUNITY): Payer: Medicare Other

## 2010-04-04 ENCOUNTER — Other Ambulatory Visit (HOSPITAL_COMMUNITY): Payer: Medicare Other

## 2010-04-04 ENCOUNTER — Encounter (HOSPITAL_COMMUNITY): Payer: Medicare Other | Attending: Orthopaedic Surgery

## 2010-04-04 ENCOUNTER — Other Ambulatory Visit: Payer: Self-pay | Admitting: Orthopaedic Surgery

## 2010-04-04 DIAGNOSIS — M169 Osteoarthritis of hip, unspecified: Secondary | ICD-10-CM | POA: Insufficient documentation

## 2010-04-04 DIAGNOSIS — Z01812 Encounter for preprocedural laboratory examination: Secondary | ICD-10-CM | POA: Insufficient documentation

## 2010-04-04 DIAGNOSIS — Z79899 Other long term (current) drug therapy: Secondary | ICD-10-CM | POA: Insufficient documentation

## 2010-04-04 DIAGNOSIS — M1610 Unilateral primary osteoarthritis, unspecified hip: Secondary | ICD-10-CM | POA: Insufficient documentation

## 2010-04-04 DIAGNOSIS — I10 Essential (primary) hypertension: Secondary | ICD-10-CM | POA: Insufficient documentation

## 2010-04-04 LAB — URINALYSIS, ROUTINE W REFLEX MICROSCOPIC
Bilirubin Urine: NEGATIVE
Ketones, ur: NEGATIVE mg/dL
Nitrite: NEGATIVE
Protein, ur: NEGATIVE mg/dL
Urobilinogen, UA: 0.2 mg/dL (ref 0.0–1.0)

## 2010-04-04 LAB — BASIC METABOLIC PANEL
Calcium: 10.1 mg/dL (ref 8.4–10.5)
GFR calc Af Amer: >60 mL/min (ref >60)
GFR calc non Af Amer: >60 mL/min (ref >60)
Glucose, Bld: 111 mg/dL — ABNORMAL HIGH (ref 70–99)
Potassium: 3.7 mEq/L (ref 3.5–5.1)
Sodium: 140 mEq/L (ref 135–145)

## 2010-04-04 LAB — CBC
MCHC: 30.6 g/dL (ref 30.0–36.0)
Platelets: 499 10*3/uL — ABNORMAL HIGH (ref 150–400)
RDW: 16.9 % — ABNORMAL HIGH (ref 11.5–15.5)
WBC: 14 10*3/uL — ABNORMAL HIGH (ref 4.0–10.5)

## 2010-04-04 LAB — SURGICAL PCR SCREEN
MRSA, PCR: NEGATIVE
Staphylococcus aureus: NEGATIVE

## 2010-04-04 LAB — PROTIME-INR
INR: 1.1 (ref 0.00–1.49)
Prothrombin Time: 14.4 seconds (ref 11.6–15.2)

## 2010-04-08 ENCOUNTER — Inpatient Hospital Stay (HOSPITAL_COMMUNITY)
Admission: RE | Admit: 2010-04-08 | Discharge: 2010-04-18 | DRG: 467 | Disposition: A | Discharge status: Skilled Nursing Facility | Payer: Medicare Other | Source: Ambulatory Visit | Attending: Orthopaedic Surgery | Admitting: Orthopaedic Surgery

## 2010-04-08 ENCOUNTER — Inpatient Hospital Stay (HOSPITAL_COMMUNITY): Payer: Medicare Other | Attending: Orthopaedic Surgery

## 2010-04-08 ENCOUNTER — Inpatient Hospital Stay (HOSPITAL_COMMUNITY): Payer: Medicare Other

## 2010-04-08 DIAGNOSIS — M129 Arthropathy, unspecified: Secondary | ICD-10-CM | POA: Diagnosis present

## 2010-04-08 DIAGNOSIS — M169 Osteoarthritis of hip, unspecified: Principal | ICD-10-CM | POA: Diagnosis present

## 2010-04-08 DIAGNOSIS — Z01812 Encounter for preprocedural laboratory examination: Secondary | ICD-10-CM

## 2010-04-08 DIAGNOSIS — I2510 Atherosclerotic heart disease of native coronary artery without angina pectoris: Secondary | ICD-10-CM | POA: Diagnosis present

## 2010-04-08 DIAGNOSIS — D62 Acute posthemorrhagic anemia: Secondary | ICD-10-CM | POA: Diagnosis not present

## 2010-04-08 DIAGNOSIS — T84039A Mechanical loosening of unspecified internal prosthetic joint, initial encounter: Secondary | ICD-10-CM | POA: Diagnosis not present

## 2010-04-08 DIAGNOSIS — Y831 Surgical operation with implant of artificial internal device as the cause of abnormal reaction of the patient, or of later complication, without mention of misadventure at the time of the procedure: Secondary | ICD-10-CM | POA: Diagnosis not present

## 2010-04-08 DIAGNOSIS — K219 Gastro-esophageal reflux disease without esophagitis: Secondary | ICD-10-CM | POA: Diagnosis present

## 2010-04-08 DIAGNOSIS — I10 Essential (primary) hypertension: Secondary | ICD-10-CM | POA: Diagnosis present

## 2010-04-08 DIAGNOSIS — H269 Unspecified cataract: Secondary | ICD-10-CM | POA: Diagnosis present

## 2010-04-08 DIAGNOSIS — M1610 Unilateral primary osteoarthritis, unspecified hip: Principal | ICD-10-CM | POA: Diagnosis present

## 2010-04-08 LAB — HEMOGLOBIN AND HEMATOCRIT, BLOOD
HCT: 20.8 % — ABNORMAL LOW (ref 36.0–46.0)
Hemoglobin: 6.4 g/dL — CL (ref 12.0–15.0)

## 2010-04-09 LAB — BASIC METABOLIC PANEL
BUN: 20 mg/dL (ref 6–23)
Creatinine, Ser: 0.87 mg/dL (ref 0.4–1.2)
GFR calc Af Amer: >60 mL/min (ref >60)
GFR calc non Af Amer: >60 mL/min (ref >60)
Potassium: 4 mEq/L (ref 3.5–5.1)

## 2010-04-09 LAB — PROTIME-INR
INR: 1.3 (ref 0.00–1.49)
Prothrombin Time: 16.4 seconds — ABNORMAL HIGH (ref 11.6–15.2)

## 2010-04-09 LAB — CBC
Hemoglobin: 9 g/dL — ABNORMAL LOW (ref 12.0–15.0)
RBC: 3.41 MIL/uL — ABNORMAL LOW (ref 3.87–5.11)

## 2010-04-10 ENCOUNTER — Inpatient Hospital Stay (HOSPITAL_COMMUNITY): Payer: Medicare Other

## 2010-04-10 ENCOUNTER — Inpatient Hospital Stay (HOSPITAL_COMMUNITY): Payer: Medicare Other | Attending: Orthopaedic Surgery

## 2010-04-10 ENCOUNTER — Encounter (HOSPITAL_COMMUNITY): Payer: Self-pay

## 2010-04-10 LAB — PREPARE RBC (CROSSMATCH)

## 2010-04-10 LAB — CBC
HCT: 23.8 % — ABNORMAL LOW (ref 36.0–46.0)
Platelets: 269 10*3/uL (ref 150–400)
RBC: 2.85 MIL/uL — ABNORMAL LOW (ref 3.87–5.11)
RDW: 16 % — ABNORMAL HIGH (ref 11.5–15.5)
WBC: 22.7 10*3/uL — ABNORMAL HIGH (ref 4.0–10.5)

## 2010-04-10 LAB — PROTIME-INR: INR: 2.66 — ABNORMAL HIGH (ref 0.00–1.49)

## 2010-04-10 LAB — BASIC METABOLIC PANEL
Calcium: 8.3 mg/dL — ABNORMAL LOW (ref 8.4–10.5)
Creatinine, Ser: 0.72 mg/dL (ref 0.4–1.2)
GFR calc Af Amer: >60 mL/min (ref >60)
Sodium: 137 mEq/L (ref 135–145)

## 2010-04-10 NOTE — H&P (Signed)
  NAME:  Katie Galvan, Katie Galvan            ACCOUNT NO.:  [id number]  MEDICAL RECORD NO.:  [id number]           PATIENT TYPE:  I  LOCATION:  1619                         FACILITY:  [facility]  PHYSICIAN:  Vanita Panda. Magnus Ivan, M.D.DATE OF BIRTH:  1927-11-23  DATE OF ADMISSION:  04/08/2010 DATE OF DISCHARGE:                             HISTORY & PHYSICAL   CHIEF COMPLAINT:  Severe right hip pain with debilitating osteoarthritis.  HISTORY OF PRESENT ILLNESS:  Katie Galvan is an 77 year old female with chronic pain, on chronic morphine, secondary to severe osteoarthritis in her right hip.  She also has severe osteoarthritis in her back as well.  At this point, she has been cleared from a medical standpoint to proceed with a total hip arthroplasty and she would like to do this.  I have explained the risks and benefits of this to her in detail and she does wish to proceed with surgery, given the impact of her hip on her activities of daily living.  PAST MEDICAL HISTORY: 1. High blood pressure. 2. Arthritis. 3. Cataracts.  MEDICATIONS: 1. Aspirin 81 mg. 2. Diltiazem. 3. Vasotec. 4. Lasix. 5. Mevacor. 6. Prilosec. 7. MiraLax. 8. Celexa. 9. Morphine sulfate. 10.Caltrate. 11.Multivitamin. 12.Vitamin D.  ALLERGIES:  SULFA.  SOCIAL HISTORY:  She is retired.  She is a widow and she lives with family.  FAMILY MEDICAL HISTORY:  High blood pressure, heart disease.  REVIEW OF SYSTEMS:  Negative for chest pain, shortness of breath, fever, chills, nausea, and vomiting.  PHYSICAL EXAMINATION:  VITAL SIGNS:  She is afebrile with stable vital signs. GENERAL:  She is alert, oriented x3 in no acute distress. HEENT:  Normocephalic, atraumatic.  Pupils are equal, round, and reactive to light. NECK:  Supple. LUNGS:  Clear to auscultation bilaterally. HEART:  Regular rate and rhythm. ABDOMEN:  Soft, nontender, nondistended. EXTREMITIES:  Right hip show severe pain with any attempts  internal, external rotation.  IMAGING STUDIES:  X-rays showed end-stage arthritis of the right hip.  IMPRESSION:  This is an 77 year old female with severe debilitating arthritis of the right hip.  PLAN:  We will proceed with a right total hip arthroplasty today. Again, the risks and benefits of this have been explained to her in detail and she does wish to proceed with surgery due to the impact it has on her daily life.     Vanita Panda. Magnus Ivan, M.D.     CYB/MEDQ  D:  04/08/2010  T:  04/09/2010  Job:  [id number]  Electronically Signed by Doneen Poisson M.D. on 04/10/2010 04:16:30 PM

## 2010-04-10 NOTE — Op Note (Signed)
NAME:  Katie Galvan, Katie Galvan            ACCOUNT NO.:  [id number]  MEDICAL RECORD NO.:  [id number]           PATIENT TYPE:  I  LOCATION:  1619                         FACILITY:  [facility]  PHYSICIAN:  Vanita Panda. Magnus Ivan, M.D.DATE OF BIRTH:  07-Jan-1927  DATE OF PROCEDURE:  04/08/2010 DATE OF DISCHARGE:                              OPERATIVE REPORT   PREOPERATIVE DIAGNOSIS:  Severe osteoarthritis and degenerative joint disease, right hip.  POSTOPERATIVE DIAGNOSIS:  Severe osteoarthritis and degenerative joint disease, right hip.  PROCEDURE:  Right total hip arthroplasty through direct anterior approach.  IMPLANTS:  DePuy [employer] acetabular component size 54, size 36 +4 neutral polyethylene liner, size 11 Corail femoral component with standard offset and HA coating, size 36+5 ceramic femoral head.  SURGEON:  Vanita Panda. Magnus Ivan, M.D.  ASSISTANT:  Janace Litten, OPA  ANESTHESIA:  General.  ANTIBIOTICS:  1 g IV Ancef.  BLOOD LOSS:  1700 cc.  COMPLICATIONS:  None.  INDICATIONS:  [name] is an 77 year old female with severe arthritis involving her right hip.  This has affected her for many years and for a long period of time she was taking care of sick husband.  She has an appointment at [facility], but she stays with family right now due to recovering from a wrist fracture so many months ago.  I have been following her for some time now.  She is, unfortunately, on chronic pain medications including oral morphine secondary to pain that she has and she has known radiographic evidence of severe arthritis of her hip and her exam also shows severe limitation with internal and external rotation as well.  Due to debilitating pain, hence effects on her activities of daily living, she does wish to proceed with a total hip arthroplasty.  The risks and benefits of this explained to her in length including risks of acute blood loss anemia and the need for  transfusion.  PROCEDURE DESCRIPTION:  After informed consent was obtained and appropriate right hip was marked, she was brought to the operating room and general anesthesia was obtained while she was on the stretcher.  A Foley catheter was placed, traction boots were placed in her feet and then she was placed on the Hana operative table.  A perineal post was placed as well and both legs were placed in large-scale retraction, but no traction was placed on them.  I first assessed the hip under direct fluoroscopic guidance to gauge the center of the pelvis, so I could see both hips for assessing leg lengths as well.  Her right hip was then prepped and draped with DuraPrep and sterile drapes.  A time-out was called and this identified the correct patient and correct right hip.  I then made an incision 1 cm distal and 3cm posterior to the anterior superior iliac spine, I dissected down through the fascia to the tensor fascia lata.  Tensor fascia lata was then divided longitudinally and I proceeded with a direct anterior approach to the hip.  A Cobra retractor was placed around the anterolateral femoral neck and then medially I teased one underneath the rectus femoris muscle.  I then divided  the hip capsule and placed the retractors within the hip capsule.  Under direct fluoroscopic guidance and visualization, I made a femoral neck cut proximal to the lesser trochanter.  I then placed a cork screw into the femoral head and removed the femoral head in its entirety.  I cleaned the acetabular debris and soft tissue and then tried to limit medialization with reaming.  I started reaming in 2 mm increments from a size 43 up to surprisingly a size 51.  I then did last few reamings under direct fluoroscopic guidance and then chose a 52 acetabular component from DePuy and under direct fluoroscopic guidance and visualization, I tapped this into place, however, it did not hold.  I then made decision to  back up and re-ream and this caused more acute blood loss anemia, but I felt I needed to do this to get a better fit on the socket.  I then went up to a size 54 socket and actually when I put this acetabular component in place, the size 54, we had good fit and fill with this, I still placed a supplemental screw as well.  I then placed a real polyoxyethylene 36+4 neutral.  Attention was turned to the femur.  With leg externally rotated to 90 degrees, the hook placed within the femur for raising the femur, the hip was extended and adducted.  I then released the lateral capsule further as well as behind the femur to gain access to the femoral canal.  Using a rongeur and a cookie cutter box guide, I was able to gain access to the femoral canal.  Then, I began broaching from a size 8 up to size 11, the size 11 filled the canal.  It was felt to be stable, I trialed a +1.5 head, then a +5 head and this was felt to be stable and reduced this in to the acetabulum.  I then removed all trial components and I placed a real hydroxyapatite coated size 11 femoral component with a collar using the Corail system.  We placed the real 36 +1.5 head at first because I thought it was going to offer her stability, but when we got the femur reduced, rotating past 90 degrees, I could pull the hip out, so I removed the ceramic +1.5 femoral head and placed a ceramic 36 +5 head.  This offered much more stability and her leg lengths were better as well.  I then copiously irrigated the tissues and closed the deep tissue within the capsule with #1 Ethibond suture followed by interrupted #1 Vicryl into the tensor fasciae latae, 2-0 Vicryl subcutaneous tissues and staples on the skin.  Xeroform followed by well- padded dressing was applied.  She was awakened, extubated, and taken to the recovering room in stable condition.  Intraoperative hemoglobin was less than 7, so we are going to initiate started to giving her 2  units of packed red blood cells in the recovery room.     Vanita Panda. Magnus Ivan, M.D.     CYB/MEDQ  D:  04/08/2010  T:  04/09/2010  Job:  [id number]  Electronically Signed by Doneen Poisson M.D. on 04/10/2010 04:16:32 PM

## 2010-04-11 LAB — PROTIME-INR
INR: 2.62 — ABNORMAL HIGH (ref 0.00–1.49)
Prothrombin Time: 28.1 seconds — ABNORMAL HIGH (ref 11.6–15.2)

## 2010-04-11 LAB — CBC
MCHC: 31.9 g/dL (ref 30.0–36.0)
Platelets: 276 10*3/uL (ref 150–400)
RDW: 15.8 % — ABNORMAL HIGH (ref 11.5–15.5)

## 2010-04-11 LAB — TYPE AND SCREEN
ABO/RH(D): B POS
Unit division: 0

## 2010-04-11 LAB — BASIC METABOLIC PANEL
BUN: 15 mg/dL (ref 6–23)
Calcium: 8.2 mg/dL — ABNORMAL LOW (ref 8.4–10.5)
Creatinine, Ser: 0.76 mg/dL (ref 0.4–1.2)
GFR calc non Af Amer: >60 mL/min (ref >60)

## 2010-04-12 ENCOUNTER — Inpatient Hospital Stay (HOSPITAL_COMMUNITY): Payer: Medicare Other

## 2010-04-12 ENCOUNTER — Inpatient Hospital Stay (HOSPITAL_COMMUNITY): Payer: Medicare Other | Attending: Orthopaedic Surgery

## 2010-04-12 LAB — CBC
HCT: 35.1 % — ABNORMAL LOW (ref 36.0–46.0)
Hemoglobin: 11 g/dL — ABNORMAL LOW (ref 12.0–15.0)
MCHC: 31.3 g/dL (ref 30.0–36.0)
WBC: 24.5 10*3/uL — ABNORMAL HIGH (ref 4.0–10.5)

## 2010-04-12 LAB — URINALYSIS, ROUTINE W REFLEX MICROSCOPIC
Nitrite: NEGATIVE
Specific Gravity, Urine: 1.02 (ref 1.005–1.030)
Urobilinogen, UA: 0.2 mg/dL (ref 0.0–1.0)

## 2010-04-12 LAB — PROTIME-INR: INR: 1.84 — ABNORMAL HIGH (ref 0.00–1.49)

## 2010-04-12 LAB — URINE MICROSCOPIC-ADD ON

## 2010-04-12 LAB — APTT: aPTT: 43 seconds — ABNORMAL HIGH (ref 24–37)

## 2010-04-13 ENCOUNTER — Inpatient Hospital Stay (HOSPITAL_COMMUNITY): Payer: Medicare Other | Attending: Orthopaedic Surgery

## 2010-04-13 LAB — CBC
HCT: 22 % — ABNORMAL LOW (ref 36.0–46.0)
Hemoglobin: 7.2 g/dL — ABNORMAL LOW (ref 12.0–15.0)
MCHC: 32.7 g/dL (ref 30.0–36.0)
MCV: 84.9 fL (ref 78.0–100.0)
RDW: 15.9 % — ABNORMAL HIGH (ref 11.5–15.5)

## 2010-04-13 LAB — BASIC METABOLIC PANEL
BUN: 15 mg/dL (ref 6–23)
Chloride: 99 mEq/L (ref 96–112)
Glucose, Bld: 215 mg/dL — ABNORMAL HIGH (ref 70–99)
Potassium: 4.6 mEq/L (ref 3.5–5.1)

## 2010-04-13 LAB — URINE CULTURE
Culture  Setup Time: 201204101242
Special Requests: NEGATIVE

## 2010-04-14 ENCOUNTER — Inpatient Hospital Stay (HOSPITAL_COMMUNITY): Payer: Medicare Other | Attending: Orthopaedic Surgery

## 2010-04-14 LAB — CROSSMATCH: Unit division: 0

## 2010-04-14 LAB — CBC
HCT: 29.6 % — ABNORMAL LOW (ref 36.0–46.0)
Hemoglobin: 9.9 g/dL — ABNORMAL LOW (ref 12.0–15.0)
MCH: 28.4 pg (ref 26.0–34.0)
MCHC: 33.4 g/dL (ref 30.0–36.0)
RDW: 15.4 % (ref 11.5–15.5)

## 2010-04-14 LAB — PROTIME-INR: INR: 2.41 — ABNORMAL HIGH (ref 0.00–1.49)

## 2010-04-15 LAB — CBC
HCT: 29 % — ABNORMAL LOW (ref 36.0–46.0)
Hemoglobin: 9.3 g/dL — ABNORMAL LOW (ref 12.0–15.0)
MCH: 28 pg (ref 26.0–34.0)
MCHC: 32.1 g/dL (ref 30.0–36.0)
MCV: 87.3 fL (ref 78.0–100.0)
RDW: 15.4 % (ref 11.5–15.5)

## 2010-04-15 LAB — PROTIME-INR: INR: 2.21 — ABNORMAL HIGH (ref 0.00–1.49)

## 2010-04-16 LAB — CBC
HCT: 28.8 % — ABNORMAL LOW (ref 36.0–46.0)
Hemoglobin: 9.3 g/dL — ABNORMAL LOW (ref 12.0–15.0)
MCH: 28.2 pg (ref 26.0–34.0)
MCHC: 32.3 g/dL (ref 30.0–36.0)
MCV: 87.3 fL (ref 78.0–100.0)
RDW: 15.4 % (ref 11.5–15.5)

## 2010-04-16 LAB — PROTIME-INR: INR: 1.92 — ABNORMAL HIGH (ref 0.00–1.49)

## 2010-04-16 NOTE — Op Note (Signed)
NAME:  Katie Galvan, Katie Galvan            ACCOUNT NO.:  [id number]  MEDICAL RECORD NO.:  [id number]           PATIENT TYPE:  I  LOCATION:  1619                         FACILITY:  [facility]  PHYSICIAN:  Vanita Panda. Magnus Ivan, M.D.DATE OF BIRTH:  20-Sep-1927  DATE OF PROCEDURE:  04/12/2010 DATE OF DISCHARGE:                              OPERATIVE REPORT   PREOPERATIVE DIAGNOSIS:  Loose acetabular component of right hip, status post right total hip arthroplasty.  POSTOPERATIVE DIAGNOSIS:  Loose acetabular component of right hip, status post right total hip arthroplasty.  PROCEDURE:  Revision of right hip acetabular component.  IMPLANTS:  DePuy size 56 revision acetabular component with 3 central and 3 peripheral screws, size 50/32 +4 neutral polyethylene liner, 32 +9 metal femoral head.  SURGEON:  Vanita Panda. Magnus Ivan, MD  ASSISTANT:  Wende Neighbors, PA  ANESTHESIA:  General.  ANTIBIOTICS:  Ancef 1 g IV.  BLOOD LOSS:  500 cc.  COMPLICATIONS:  None.  FINDINGS:  Loose acetabular component with deficient central and posterior walls, but intact columns.  INDICATION:  Katie Galvan is an 77 year old female who this past Friday, on April 08, 2010, underwent a direct anterior right total hip arthroplasty.  During that case, I went from a size 52 to 54 acetabular component due to insufficient bone quality in the intraoperative films and showed the acetabular component, what I felt was an appropriate inclination, abduction, and anteversion.  I even placed a single screw and I did not feel the cup to move and I felt like I had good seating of this cup.  Postoperatively, I got x-rays and they did show more vertical orientation of the acetabular component then had been previously seen intraoperative.  I then subsequently obtained more films after her weightbearing and I felt that her leg lengths were then getting shorter. I obtained CT scan as well, and it showed she had definitively a  loose acetabular component.  I talked to her and her family in length about this and the need for revision arthroplasty.  I also explained that this will not be done through direct anterior approach and I would prefer a lateral approach to the case such as this for revision.  I did discuss this case with several colleagues as well.  I felt that it was appropriate to proceed with a revision of the acetabular component.  DESCRIPTION OF PROCEDURE:  After informed consent was obtained, appropriate right hip was marked.  She was brought to the operating room, placed in supine on the operating room table, and general anesthesia was then obtained.  She was turned into the lateral decubitus position.  With the hip positioners in the front and back and the right hip up.  An axillary roll was placed as well.  Padding was placed under her down nonoperative left leg.  Her left hip was then prepped and draped with DuraPrep in sterile drapes including sterile stockinette.  A time-out was called to ensure correct patient, correct right hip.  I then made an incision directly over the greater trochanter and carried this proximally and distally.  I dissected down to the iliotibial band and then divided  the iliotibial band longitudinally.  A Charnley retractor was placed and then I proceeded with a direct lateral to anterolateral approach of the hip.  I took down the gluteus, medius, and minimus tendons sleeve off the greater trochanter and was able to easily gain access to the hip.  I placed a hook underneath the femoral component neck and was able to easily reduce this.  I then removed the ceramic hip ball.  The acetabulum was obviously quite loose, I removed the polyethylene and then took out the remaining screw to expose the acetabulum.  I easily pulsatile lavaged and completely irrigated the acetabulum and I did find my central bone defect that I had noted in the other case and I felt that the  remaining of her bone was quite shallow. I placed a size 56 reamer, but did not ream just to see if I would get a good rim fit.  It did felt like I had a good fit, so we irrigated it again and I placed a size 56 revision acetabular component from the DePuy.  I put in the cup and I did place some bone graft centrally.  I then placed a single screw and brought in plane radiographs to access the version and inclination of the socket and it was felt that I had good inclination.  I then was able to fill 2 more central holes and then 3 peripheral holes.  None of these screws had great bites to them, but the acetabulum would not move after I torqued it back and forth to see if it moved.  I did fill the central hole with a hole eliminator and then placed a 50/32 +4 neutral polyethylene liner.  Next, I trialed up to a +9 hip ball, and this was felt to be solid, better with leg lengths and stability, so I placed the real size 32 +9 metal hip ball.  We reduced this back into the acetabulum and I felt that her leg lengths were re-established, I then copiously irrigated the tissues again with pulsatile lavage using normal saline.  I was able to close the hip capsule once again with interrupted #1 Ethibond suture.  I reapproximated the gluteus, medius, and minimus tendons back to the greater trochanter with #1 Ethibond suture as well and closed the iliotibial band with #1 Vicryl suture interrupted followed by 2-0 Vicryl and subcutaneous tissue and staples on the skin.  A well-padded sterile dressing was applied.  We then rolled the patient back into the supine position and her leg lengths were equal again.  She was awake and extubated and taken to recovery room in stable condition.  All final counts were correct.  There were no complications noted.  Of note, Maud Deed, [facility] was present for the entirety of the case, and her presence was __________ with getting exposure to the hip, retracting tissues  were replaced with the hip socket in place, and then it was __________ as well.     Vanita Panda. Magnus Ivan, M.D.     CYB/MEDQ  D:  04/12/2010  T:  04/13/2010  Job:  [id number]  Electronically Signed by Doneen Poisson M.D. on 04/16/2010 04:56:55 PM

## 2010-04-17 ENCOUNTER — Inpatient Hospital Stay (HOSPITAL_COMMUNITY): Payer: Medicare Other | Attending: Orthopaedic Surgery

## 2010-04-18 LAB — PROTIME-INR
INR: 1.76 — ABNORMAL HIGH (ref 0.00–1.49)
Prothrombin Time: 20.7 seconds — ABNORMAL HIGH (ref 11.6–15.2)

## 2010-04-25 ENCOUNTER — Ambulatory Visit: Payer: Self-pay | Admitting: Pulmonary Disease

## 2010-04-25 NOTE — Discharge Summary (Signed)
NAME:  Katie Galvan, Katie Galvan            ACCOUNT NO.:  [id number]  MEDICAL RECORD NO.:  [id number]           PATIENT TYPE:  I  LOCATION:  1619                         FACILITY:  [facility]  PHYSICIAN:  Vanita Panda. Magnus Ivan, M.D.DATE OF BIRTH:  1927-04-01  DATE OF ADMISSION:  04/08/2010 DATE OF DISCHARGE:  04/18/2010                              DISCHARGE SUMMARY   ADMITTING DIAGNOSES:  Severe osteoarthritis and degenerative joint disease, right hip.  SECONDARY DIAGNOSES: 1. Chronic pain. 2. High blood pressure. 3. Coronary artery disease. 4. Arthritis. 5. Cataracts.  DISCHARGE DIAGNOSES:  Status post right total hip arthroplasty and revision of acetabular component.  PROCEDURE: 1. Right total hip arthroplasty on April 08, 2010. 2. Revision of loose acetabular component on April 12, 2010.  HOSPITAL COURSE:  Katie Galvan is an 77 year old female with severe debilitating arthritis involving her right hip.  This has gone to the point where she is on chronic pain medications.  She did finally wish to proceed with total hip arthroplasty given the effects on her activities of daily living.  She was taken to the operating room on the day of admission where she underwent a successful right total hip arthroplasty. I did have trouble with getting the acetabular component to seat with a larger cup and screw this, I felt this seated appropriately.  I tested this in the OR and had intraoperative x-rays.  However, postoperative x- rays showed loosening of that prosthesis and shortening of her leg, hence recommended she undergo revision of this.  I explained this in detail to her and her family.  She was taken back to the operating room on April 12, 2010, where I was able to turn her into a lateral position and go through lateral approach to place a revision acetabular component.  The remainder of her hospital course was uneventful.  She did have several episodes after first surgery and second  surgery due to blood loss anemia, requiring a transfusion.  By the day of discharge, I had put her only with a normal weightbearing to touch-down weightbearing only on her right hip.  She was slightly shorter on that, but serial radiographs of hip showed intact stable implant.  It was felt that she needed further convalescence in a skilled nursing facility.  DISPOSITION:  Discharged to skilled nursing facility.  DISCHARGE MEDICATIONS: 1. Morphine sulfate 45 mg p.o. twice daily (this only comes in 30 mg     and 15 mg tablets, so one 30 mg and one 15 mg in the a.m. and one     30 mg and one 15 mg in the p.m.). 2. Coumadin 2 mg p.o. daily at 1800 hours adjusted dose per target INR     of 2.0 to 3.0. 3. Vitamin D3 1000 units p.o. 2 tablets every evening. 4. Prilosec 20 mg p.o. q.a.m. 5. Multivitamin tablet 1 p.o. q.a.m. 6. Mevacor 20 mg p.o. at bedtime. 7. Loratadine 10 mg p.o. q.a.m. 8. Lasix 40 mg p.o. q.a.m. 9. Enalapril 20 mg p.o. q.a.m. 10.Carafate 1000 mg p.o. b.i.d. 11.Diltiazem or Dilacor 240 mg p.o. b.i.d.  DISCHARGE INSTRUCTIONS:  While she is at the skilled nursing  facility, she should still remain nonweightbearing to only touch-down weightbearing on her right hip.  Her incision should not get wet in shower with dry dressings daily placed daily.  A followup appointment should be established in my office in 2 weeks after discharge.     Vanita Panda. Magnus Ivan, M.D.     CYB/MEDQ  D:  04/17/2010  T:  04/17/2010  Job:  [id number]  Electronically Signed by Doneen Poisson M.D. on 04/25/2010 10:55:12 PM

## 2010-06-04 ENCOUNTER — Inpatient Hospital Stay (HOSPITAL_COMMUNITY)
Admission: EM | Admit: 2010-06-04 | Discharge: 2010-06-13 | DRG: 196 | Disposition: A | Discharge status: Skilled Nursing Facility | Payer: Medicare Other | Attending: Internal Medicine | Admitting: Internal Medicine

## 2010-06-04 ENCOUNTER — Emergency Department (HOSPITAL_COMMUNITY): Payer: Medicare Other

## 2010-06-04 ENCOUNTER — Telehealth: Payer: Self-pay | Admitting: Pulmonary Disease

## 2010-06-04 DIAGNOSIS — D72829 Elevated white blood cell count, unspecified: Secondary | ICD-10-CM | POA: Diagnosis present

## 2010-06-04 DIAGNOSIS — J8410 Pulmonary fibrosis, unspecified: Principal | ICD-10-CM | POA: Diagnosis present

## 2010-06-04 DIAGNOSIS — I5032 Chronic diastolic (congestive) heart failure: Secondary | ICD-10-CM | POA: Diagnosis present

## 2010-06-04 DIAGNOSIS — F3289 Other specified depressive episodes: Secondary | ICD-10-CM | POA: Diagnosis present

## 2010-06-04 DIAGNOSIS — Z96649 Presence of unspecified artificial hip joint: Secondary | ICD-10-CM

## 2010-06-04 DIAGNOSIS — J9620 Acute and chronic respiratory failure, unspecified whether with hypoxia or hypercapnia: Secondary | ICD-10-CM | POA: Diagnosis present

## 2010-06-04 DIAGNOSIS — I10 Essential (primary) hypertension: Secondary | ICD-10-CM | POA: Diagnosis present

## 2010-06-04 DIAGNOSIS — F329 Major depressive disorder, single episode, unspecified: Secondary | ICD-10-CM | POA: Diagnosis present

## 2010-06-04 LAB — CBC
HCT: 28 % — ABNORMAL LOW (ref 36.0–46.0)
MCV: 79.8 fL (ref 78.0–100.0)
Platelets: 468 10*3/uL — ABNORMAL HIGH (ref 150–400)
RBC: 3.51 MIL/uL — ABNORMAL LOW (ref 3.87–5.11)
RDW: 16.7 % — ABNORMAL HIGH (ref 11.5–15.5)
WBC: 14.3 10*3/uL — ABNORMAL HIGH (ref 4.0–10.5)

## 2010-06-04 LAB — DIFFERENTIAL
Basophils Absolute: 0 10*3/uL (ref 0.0–0.1)
Eosinophils Relative: 2 % (ref 0–5)
Lymphocytes Relative: 7 % — ABNORMAL LOW (ref 12–46)
Lymphs Abs: 1.1 10*3/uL (ref 0.7–4.0)
Neutro Abs: 11.6 10*3/uL — ABNORMAL HIGH (ref 1.7–7.7)
Neutrophils Relative %: 81 % — ABNORMAL HIGH (ref 43–77)

## 2010-06-04 LAB — COMPREHENSIVE METABOLIC PANEL
ALT: 10 U/L (ref 0–35)
AST: 12 U/L (ref 0–37)
Alkaline Phosphatase: 87 U/L (ref 39–117)
CO2: 29 mEq/L (ref 19–32)
Calcium: 9.6 mg/dL (ref 8.4–10.5)
GFR calc Af Amer: >60 mL/min (ref >60)
Glucose, Bld: 148 mg/dL — ABNORMAL HIGH (ref 70–99)
Potassium: 4 mEq/L (ref 3.5–5.1)
Sodium: 137 mEq/L (ref 135–145)
Total Protein: 6.7 g/dL (ref 6.0–8.3)

## 2010-06-04 LAB — IRON AND TIBC
Iron: 10 ug/dL — ABNORMAL LOW (ref 42–135)
Saturation Ratios: 6 % — ABNORMAL LOW (ref 20–55)
TIBC: 157 ug/dL — ABNORMAL LOW (ref 250–470)
UIBC: 147 ug/dL

## 2010-06-04 LAB — CK TOTAL AND CKMB (NOT AT ARMC)
CK, MB: 1.7 ng/mL (ref 0.3–4.0)
Total CK: 16 U/L (ref 7–177)

## 2010-06-04 LAB — RETICULOCYTES: Retic Count, Absolute: 17 10*3/uL — ABNORMAL LOW (ref 19.0–186.0)

## 2010-06-04 NOTE — Telephone Encounter (Signed)
She had low O2 satn 85% & low Hb 8.5 at NH. Advised to go to hospital. Son calling to find out if any other way. CXR was reported nml. I note Hb of 9.3 in April. More worrisome is low satn which can be due to blood clots if xcr nml. Defer to MD at [facility] whether she needs urgent evaluation or not

## 2010-06-05 LAB — TYPE AND SCREEN: ABO/RH(D): B POS

## 2010-06-05 LAB — URINE MICROSCOPIC-ADD ON

## 2010-06-05 LAB — URINALYSIS, ROUTINE W REFLEX MICROSCOPIC
Bilirubin Urine: NEGATIVE
Glucose, UA: NEGATIVE mg/dL
Hgb urine dipstick: NEGATIVE
Ketones, ur: NEGATIVE mg/dL
Protein, ur: NEGATIVE mg/dL
Urobilinogen, UA: 0.2 mg/dL (ref 0.0–1.0)

## 2010-06-05 LAB — FERRITIN: Ferritin: 628 ng/mL — ABNORMAL HIGH (ref 10–291)

## 2010-06-05 LAB — VITAMIN B12: Vitamin B-12: 304 pg/mL (ref 211–911)

## 2010-06-06 ENCOUNTER — Telehealth: Payer: Self-pay | Admitting: *Deleted

## 2010-06-06 LAB — PHOSPHORUS: Phosphorus: 3.5 mg/dL (ref 2.3–4.6)

## 2010-06-06 LAB — BASIC METABOLIC PANEL
CO2: 37 mEq/L — ABNORMAL HIGH (ref 19–32)
Calcium: 9.8 mg/dL (ref 8.4–10.5)
Chloride: 99 mEq/L (ref 96–112)
Creatinine, Ser: 0.68 mg/dL (ref 0.4–1.2)
GFR calc Af Amer: >60 mL/min (ref >60)
Glucose, Bld: 96 mg/dL (ref 70–99)

## 2010-06-06 LAB — CBC
HCT: 30.6 % — ABNORMAL LOW (ref 36.0–46.0)
Hemoglobin: 9.4 g/dL — ABNORMAL LOW (ref 12.0–15.0)
MCH: 24.7 pg — ABNORMAL LOW (ref 26.0–34.0)
MCHC: 30.7 g/dL (ref 30.0–36.0)
RBC: 3.81 MIL/uL — ABNORMAL LOW (ref 3.87–5.11)

## 2010-06-06 LAB — URINE CULTURE
Colony Count: 75000
Culture  Setup Time: 201206031726
Special Requests: NEGATIVE

## 2010-06-06 LAB — FOLATE RBC: RBC Folate: 1959 ng/mL — ABNORMAL HIGH (ref >=366)

## 2010-06-06 NOTE — Telephone Encounter (Signed)
SN is aware of pt in the hospital.

## 2010-06-07 LAB — UIFE/LIGHT CHAINS/TP QN, 24-HR UR
Alpha 1, Urine: DETECTED — AB
Alpha 2, Urine: DETECTED — AB
Beta, Urine: DETECTED — AB
Total Protein, Urine: 32.2 mg/dL

## 2010-06-07 LAB — PRO B NATRIURETIC PEPTIDE: Pro B Natriuretic peptide (BNP): 2042 pg/mL — ABNORMAL HIGH (ref 0–450)

## 2010-06-08 ENCOUNTER — Other Ambulatory Visit: Payer: Self-pay | Admitting: Internal Medicine

## 2010-06-08 DIAGNOSIS — D72819 Decreased white blood cell count, unspecified: Secondary | ICD-10-CM

## 2010-06-08 LAB — PROTEIN ELECTROPH W RFLX QUANT IMMUNOGLOBULINS
Albumin ELP: 44.3 % — ABNORMAL LOW (ref 55.8–66.1)
Alpha-1-Globulin: 9.7 % — ABNORMAL HIGH (ref 2.9–4.9)
Alpha-2-Globulin: 16.1 % — ABNORMAL HIGH (ref 7.1–11.8)
Beta Globulin: 5.4 % (ref 4.7–7.2)
Total Protein ELP: 6 g/dL (ref 6.0–8.3)

## 2010-06-08 LAB — CBC
HCT: 32.4 % — ABNORMAL LOW (ref 36.0–46.0)
Hemoglobin: 10 g/dL — ABNORMAL LOW (ref 12.0–15.0)
RBC: 4.05 MIL/uL (ref 3.87–5.11)

## 2010-06-08 LAB — BASIC METABOLIC PANEL
CO2: 40 mEq/L (ref 19–32)
Chloride: 97 mEq/L (ref 96–112)
GFR calc Af Amer: >60 mL/min (ref >60)
Potassium: 3.6 mEq/L (ref 3.5–5.1)
Sodium: 140 mEq/L (ref 135–145)

## 2010-06-09 ENCOUNTER — Inpatient Hospital Stay (HOSPITAL_COMMUNITY): Payer: Medicare Other

## 2010-06-09 LAB — BASIC METABOLIC PANEL
CO2: 39 mEq/L — ABNORMAL HIGH (ref 19–32)
Chloride: 95 mEq/L — ABNORMAL LOW (ref 96–112)
Creatinine, Ser: 0.65 mg/dL (ref 0.4–1.2)
GFR calc Af Amer: >60 mL/min (ref >60)
Sodium: 139 mEq/L (ref 135–145)

## 2010-06-09 LAB — IGG, IGA, IGM
IgA: 264 mg/dL (ref 69–380)
IgG (Immunoglobin G), Serum: 874 mg/dL (ref 690–1700)
IgM, Serum: 163 mg/dL (ref 52–322)

## 2010-06-10 ENCOUNTER — Inpatient Hospital Stay (HOSPITAL_COMMUNITY): Payer: Medicare Other

## 2010-06-10 LAB — CBC
Platelets: 425 10*3/uL — ABNORMAL HIGH (ref 150–400)
RDW: 16.6 % — ABNORMAL HIGH (ref 11.5–15.5)
WBC: 11.2 10*3/uL — ABNORMAL HIGH (ref 4.0–10.5)

## 2010-06-10 LAB — DIFFERENTIAL
Basophils Absolute: 0.1 10*3/uL (ref 0.0–0.1)
Eosinophils Absolute: 0.7 10*3/uL (ref 0.0–0.7)
Monocytes Absolute: 1.1 10*3/uL — ABNORMAL HIGH (ref 0.1–1.0)
Neutrophils Relative %: 66 % (ref 43–77)

## 2010-06-10 MED ORDER — IOHEXOL 300 MG/ML  SOLN
80.0000 mL | Freq: Once | INTRAMUSCULAR | Status: AC | PRN
  Administered 2010-06-10: 80 mL via INTRAVENOUS

## 2010-06-11 LAB — URINE CULTURE: Special Requests: NEGATIVE

## 2010-06-11 LAB — BASIC METABOLIC PANEL
BUN: 14 mg/dL (ref 6–23)
Chloride: 99 mEq/L (ref 96–112)
GFR calc Af Amer: >60 mL/min (ref >60)
Glucose, Bld: 121 mg/dL — ABNORMAL HIGH (ref 70–99)
Potassium: 3.9 mEq/L (ref 3.5–5.1)
Sodium: 138 mEq/L (ref 135–145)

## 2010-06-11 LAB — METHYLMALONIC ACID, SERUM: Methylmalonic Acid, Quantitative: 241 nmol/L (ref 87–318)

## 2010-06-11 NOTE — H&P (Signed)
NAME:  Katie Galvan, Katie Galvan            ACCOUNT NO.:  [id number]  MEDICAL RECORD NO.:  [id number]           PATIENT TYPE:  E  LOCATION:  WLED                         FACILITY:  [facility]  PHYSICIAN:  Jonny Ruiz, MD    DATE OF BIRTH:  31-Jul-1927  DATE OF ADMISSION:  06/04/2010 DATE OF DISCHARGE:                             HISTORY & PHYSICAL   CHIEF COMPLAINT:  Dyspnea and anemia.  HISTORY OF PRESENT ILLNESS:  The patient is an 77 year old female with a past medical history significant for anemia, hemoglobin 8.0 at the nursing home, who is brought by her son at the request of the nursing facility's PA for evaluation of worsening shortness of breath, dyspnea on exertion, and anemia.  The patient voices no complaints while at rest.  She would get short of breath if she tries to walk and in fact, she has decreased her ability to ambulate to not more than 50 feet.  The patient denies chest pain, palpitations, orthopnea, nocturia, or PND. She has no history of cardiac disorders and underwent an echo in March of this year, which showed mild LVH, normal systolic function with an ejection fraction of 55% to 65%, grade 1 diastolic dysfunction, calcification of the aorta with mild regurgitation, and slightly increased pulmonary artery pressure.  The patient also has a history of elevated white count since last year, for which no explanation has been found.  Her most recent CBC from outside was yesterday, which showed a white count of 15.5, hemoglobin 8.0, hematocrit 25.9, MCV 79, and platelet count 409.  PAST MEDICAL HISTORY:  Significant for: 1. Severe osteoarthritis of the hips with chronic pain syndrome. 2. Status post hip replacement in April 08, 2010, requiring 2     interventions.  At that time, there was significant blood loss and     the patient received 2 units of packed RBCs.  She was told she has     a heart murmur, however, her echo just showed mild AR. 3. History of  hypertension. 4. Allergic rhinitis. 5. Allergic sinusitis. 6. Left wrist fracture in November 2011. 7. History of appendectomy, tonsillectomy and adenoidectomy. 8. Colonoscopy 7 years ago normal. 9. Hiatal hernia. 10.GERD. 11.Depression. 12.Significant for hypertension.  No history of leukemia or anemia.  CURRENT MEDICATIONS: 1. Enalapril 20 mg a day. 2. Lasix 40 mg a day. 3. Lovastatin 20 mg a day. 4. Vitamin D 2000 units daily. 5. Multivitamin once a day. 6. Loratadine 10 mg a day. 7. Citalopram 20 mg a day. 8. Calcium 600/D 400 1 tablet twice a day. 9. Cardizem CD extended release 240 mg once a day. 10.Senna-S 1 tablet b.i.d. 11.Ativan 0.5 mg q.6 h. p.r.n. anxiety. 12.Percocet 325/5 1 tablet q.6 h. p.r.n. breakthrough pain. 13.Lasix 20 mg daily p.r.n. for increased edema. 14.Morphine sulfate extended release every 12 hours 45 mg p.o. b.i.d. 15.MiraLax 17 g a day. 16.Ferrous sulfate 325 mg twice a day. 17.Prilosec 20 mg a day.  ALLERGIES:  The patient is allergic to SULFA.  SOCIAL HISTORY:  The patient is a recent widow.  Her husband passed 6 months ago.  She has 2 children, one who  lives outside [place] and the other one and a daughter who lives in [place].  She used to be a social drinker.  Currently, she denies tobacco or alcohol.  She is a full code.  REVIEW OF SYSTEMS:  CONSTITUTIONAL:  Denies fever, chills, night sweats, or weight loss.  She feels fatigue.  HEENT:  Significant just for her allergies, which are controlled with Claritin.  CARDIOVASCULAR:  She denies chest pain, palpitations, orthopnea, nocturia, or PND.  She does have edema, which she has had since last year.  RESPIRATORY:  Denies history of asthma, emphysema, or tuberculosis.  GI:  Denies nausea, vomiting, hematemesis, melena, or hematochezia.  GU:  Denies dysuria, frequency, or hematuria.  NEUROLOGICAL:  Denies headache, feels a little lightheaded, which she attributes to feeling hungry.   Denies focal weakness, numbness, or paresthesias.  PHYSICAL EXAMINATION:  VITAL SIGNS:  Temperature 98, respirations 18, heart rate 69, and blood pressure 108/65. GENERAL APPEARANCE:  The patient is an elderly white female who appears in no distress.  She is awake, alert, and oriented and cooperative. SKIN:  Skin and mucosa normal.  No pallor. HEENT:  Unremarkable. NECK:  Supple without JVD or lymphadenopathy. CHEST:  Symmetric. HEART:  Regular S1 and S2 without gallops, murmurs, or rubs. LUNGS:  Clear to auscultation. ABDOMEN:  Slightly distended with normal bowel sounds, soft, and nontender without organomegaly or masses palpable. EXTREMITIES:  With no clubbing or cyanosis.  There is a 1+ pitting edema bilaterally. NEUROLOGICAL:  Nonfocal.  LABORATORY DATA:  White count 14.3, hemoglobin 8.7, hematocrit 28, platelets 168, and MCV 79.8.  Comprehensive metabolic panel normal. Cardiac enzymes including troponin normal.  Albumin 2.9.  EKG normal.  IMPRESSION: 1. Dyspnea. 2. Anemia. 3. Leukocytosis. 4. Severe osteoarthritis with chronic pain syndrome status post hip     replacement on April 08, 2010 with blood loss at the time of     surgery.5. Hypertension.  PLAN:  We will admit the patient to observation and obtain iron studies, B12, RBC, folate, reticulocyte count, serum protein electrophoresis, urine protein electrophoresis, TSH, and repeat H and H in a.m. Transfuse 1 unit of packed RBCs preceded by Lasix 40 mg IV.          ______________________________ Jonny Ruiz, MD     GL/MEDQ  D:  06/04/2010  T:  06/04/2010  Job:  [id number]  Electronically Signed by Jonny Ruiz MD on 06/11/2010 07:56:11 PM

## 2010-06-13 ENCOUNTER — Telehealth: Payer: Self-pay | Admitting: Pulmonary Disease

## 2010-06-13 NOTE — Telephone Encounter (Signed)
Called and spoke with Katie Galvan at pt's assisted living facility. She states pt was d/c from [facility] hosp today and needs a post hosp f/u appt with SN.  No appts avail.  Please advise.  Thanks.

## 2010-06-13 NOTE — Discharge Summary (Signed)
NAME:  Katie Galvan, Katie Galvan            ACCOUNT NO.:  [id number]  MEDICAL RECORD NO.:  [id number]  LOCATION:  1335                         FACILITY:  [facility]  PHYSICIAN:  Marinda Elk, M.D.DATE OF BIRTH:  January 26, 1927  DATE OF ADMISSION:  06/04/2010 DATE OF DISCHARGE:                         DISCHARGE SUMMARY-REFERRING   PRIMARY CARE DOCTOR:  Dr. Lorin Picket and gastroenterologist, Dr. Juanda Chance.  DISCHARGE DIAGNOSES: 1. Hypoxic dyspnea, acute on chronic respiratory failure, probably     secondary to severe obstructive airway disease with severe     restricted interstitial disease. 2. Leukocytosis. 3. Hypertension. 4. Chronic diastolic heart failure.  DISCHARGE MEDICATIONS: 1. Advair 100/58 one puff b.i.d. 2. Spiriva 18 mcg inhaled daily. 3. Tylenol 650 mg q.4 h p.r.n. 4. Ativan 0.5 mg q.3 h p.r.n. 5. Calcium 600/400 b.i.d. 6. Celexa 20 mg every morning. 7. Diltiazem 240 mg b.i.d. 8. Enalapril 20 mg daily. 9. Ferrous sulfate 325 mg b.i.d. 10.Lasix 40 mg daily and 20 mg if needed. 11.Loratadine 10 mg daily. 12.Coreg 20 mg at bedtime. 13.Miralax 17 g daily. 14.MS Contin 45 mg b.i.d. 15.Percocet 5/325 mg q.6 h p.r.n. 16.Prilosec 20 mg daily. 17.Senokot two tablets b.i.d. 18.Vitamin D 1000 units daily. 19.Lovastatin 20 mg daily  PROCEDURES PERFORMED:  CT scan of the chest with contrast that showed marked chronic asymmetric elevation of the right diaphragm.  No acute findings were explained.  Right lower lobe nodule measuring 4 mm.  Next chest x-ray showed stable, no active cardiopulmonary disease.  Full PFTs that showed severe obstructive airway disease with severe restrictive disease/interstitial and severe diffusion defect.  BRIEF ADMITTING HISTORY AND PHYSICAL:  This is an 77 year old female with past medical history significant for anemia, hemoglobin of 8 at nursing home, was brought in by her son, we request nursing home facility, PA for evaluation of worsening shortness  of breath and anemia. The patient was not complaining while at rest.  She is short of breath and she tries to walk and she had decreased ability to ambulate, no more than 50 feet.  The patient denies any chest pain, palpitation, orthopnea, PND.  She has no history of cardiac disorders, therefore, diastolic heart failure.  She also has calcification of the aorta with mild regurg.  Increased pulmonary pressures.  So, we are asked to admit and further evaluate.  PHYSICAL EXAMINATION:  VITAL SIGNS:  Temperature 98, respiration of 18, heart rate of 69, blood pressure 108/65.  GENERAL:  Elderly female who appears in no distress.  Skin and mucosa were normal.  No pallor. HEENT:  Unremarkable.  NECK:  Supple.  JVD without lymphadenopathy. LUNGS:  Symmetric.  Chest wall clear to auscultation with moderate air movement.  CARDIOVASCULAR:  Regular rate and rhythm with positive S1, S2.  ABDOMEN:  Positive bowel sounds, slightly distended, soft, nontender.  EXTREMITIES:  With positive pulses.  No clubbing, cyanosis, or edema.  Labs on admission, white count 14, hemoglobin of 8, platelet count 168, MCV of 79.  Cardiac enzymes were normal.  Complete metabolic panel within normal limits except for her albumin which is 2.9.  EKG is normal.  IMPRESSION/PLAN: 1. Acute on chronic respiratory failure, probably secondary to lung     disease.  She was  admitted to the hospital, thought this     respiratory failure is probably secondary to diastolic heart     failure.  BNP was increased.  She started to diurese.  Her     creatinine increased and her bicarb significantly increased, so     this was stopped.  She was given IV fluid.  A CT scan was done with     results above, which will need to follow up.  Chest x-ray to follow     up on this millimeter nodule.  She had no fevers in the hospital     and was concerned that she might have interstitial lung disease.     PFTs were done and CT scan was done with  results above.  PFTs show     she has significant COPD.  She was started on Spiriva and Advair.     She will have to go home on home oxygen.  She was saturating 92-95%     on 3 L.  She will follow up with Dr. [name]. 2. Leukocytosis, concerned about interstitial lung disease/MDS.     Hematology was consulted during the lab.  They are pending at the     time of dictation.  She will follow up with Dr. Arbutus Ped as an     outpatient. 3. Hypertension, currently well controlled.  No changes were made. 4. Chronic diastolic heart failure.  She was admitted to the hospital.     Initially, thought she was in heart failure.  She was diuresed, but     as her bicarb went up, so her Lasix was stopped.  She was given IV     fluids and this improved.  The patient was not orthostatic on the     day of discharge and was discharged in stable condition. 5. A 4 mm lung nodule on CT scan.  The patient is at very low risk for     bronchogenic carcinoma.  She will have follow up chest x-ray with     PCP in 6 months.  DISPOSITION:  The patient will follow up with her PCP, Dr. [name]. Here we will see how her breathing is doing.  We will titrate her COPD medications as needed.  We will continue further evaluation for interstitial lung disease if needed.  At this time, she will also follow up with Hem/Onc as an outpatient to see the results of her labs about questionable MDS.  Over at this, leukocytosis has to do with her interstitial lung disease.  Vitals on the day of discharge, temperature 98, pulse 65, respirations 17.  She was sating 96% on 2 L.  Blood pressure was 129/61.     Marinda Elk, M.D.     AF/MEDQ  D:  06/13/2010  T:  06/13/2010  Job:  [id number]  cc:   Lonzo Cloud. [name], MD [address] [place] [address]  Dr. Sofie Hartigan  Electronically Signed by Marinda Elk M.D. on 06/13/2010 11:22:48 AM

## 2010-06-14 NOTE — Telephone Encounter (Signed)
Spoke with [place] and notified of appt date/time- this was okay with her so appt was sched.

## 2010-06-14 NOTE — Telephone Encounter (Signed)
Ok to add pt on June 28 at 2pm.  thanks

## 2010-06-16 LAB — CULTURE, BLOOD (ROUTINE X 2)
Culture  Setup Time: 201206080122
Culture: NO GROWTH

## 2010-06-29 ENCOUNTER — Encounter: Payer: Self-pay | Admitting: Pulmonary Disease

## 2010-06-30 ENCOUNTER — Ambulatory Visit (INDEPENDENT_AMBULATORY_CARE_PROVIDER_SITE_OTHER): Payer: Medicare Other | Admitting: Pulmonary Disease

## 2010-06-30 ENCOUNTER — Other Ambulatory Visit (INDEPENDENT_AMBULATORY_CARE_PROVIDER_SITE_OTHER): Payer: Medicare Other

## 2010-06-30 ENCOUNTER — Encounter: Payer: Self-pay | Admitting: Pulmonary Disease

## 2010-06-30 ENCOUNTER — Ambulatory Visit (INDEPENDENT_AMBULATORY_CARE_PROVIDER_SITE_OTHER)
Admission: RE | Admit: 2010-06-30 | Discharge: 2010-06-30 | Disposition: A | Discharge status: Home / Self Care | Payer: Medicare Other | Source: Ambulatory Visit | Attending: Pulmonary Disease | Admitting: Pulmonary Disease

## 2010-06-30 DIAGNOSIS — I10 Essential (primary) hypertension: Secondary | ICD-10-CM

## 2010-06-30 DIAGNOSIS — J986 Disorders of diaphragm: Secondary | ICD-10-CM

## 2010-06-30 DIAGNOSIS — D649 Anemia, unspecified: Secondary | ICD-10-CM

## 2010-06-30 DIAGNOSIS — R0609 Other forms of dyspnea: Secondary | ICD-10-CM

## 2010-06-30 DIAGNOSIS — J9620 Acute and chronic respiratory failure, unspecified whether with hypoxia or hypercapnia: Secondary | ICD-10-CM

## 2010-06-30 DIAGNOSIS — G894 Chronic pain syndrome: Secondary | ICD-10-CM

## 2010-06-30 DIAGNOSIS — M1990 Unspecified osteoarthritis, unspecified site: Secondary | ICD-10-CM

## 2010-06-30 DIAGNOSIS — I38 Endocarditis, valve unspecified: Secondary | ICD-10-CM

## 2010-06-30 DIAGNOSIS — R609 Edema, unspecified: Secondary | ICD-10-CM

## 2010-06-30 DIAGNOSIS — M545 Low back pain: Secondary | ICD-10-CM

## 2010-06-30 DIAGNOSIS — I872 Venous insufficiency (chronic) (peripheral): Secondary | ICD-10-CM

## 2010-06-30 LAB — CBC WITH DIFFERENTIAL/PLATELET
Eosinophils Absolute: 0.4 10*3/uL (ref 0.0–0.7)
Eosinophils Relative: 3.3 % (ref 0.0–5.0)
MCV: 77.4 fl — ABNORMAL LOW (ref 78.0–100.0)
Monocytes Absolute: 1 10*3/uL (ref 0.1–1.0)
Neutrophils Relative %: 72.6 % (ref 43.0–77.0)
Platelets: 401 10*3/uL — ABNORMAL HIGH (ref 150.0–400.0)
WBC: 10.8 10*3/uL — ABNORMAL HIGH (ref 4.5–10.5)

## 2010-06-30 LAB — BASIC METABOLIC PANEL
BUN: 17 mg/dL (ref 6–23)
Calcium: 8.9 mg/dL (ref 8.4–10.5)
Chloride: 99 mEq/L (ref 96–112)
Creatinine, Ser: 0.8 mg/dL (ref 0.4–1.2)

## 2010-06-30 LAB — IBC PANEL
Iron: 15 ug/dL — ABNORMAL LOW (ref 42–145)
Transferrin: 150.8 mg/dL — ABNORMAL LOW (ref 212.0–360.0)

## 2010-06-30 MED ORDER — FUROSEMIDE 40 MG PO TABS: 40.0000 mg | ORAL_TABLET | Freq: Two times a day (BID) | ORAL | Status: DC

## 2010-06-30 MED ORDER — MORPHINE SULFATE 15 MG PO TABS: 15.0000 mg | ORAL_TABLET | ORAL | Status: DC

## 2010-06-30 MED ORDER — MORPHINE SULFATE 30 MG PO TABS: 30.0000 mg | ORAL_TABLET | Freq: Two times a day (BID) | ORAL | Status: DC

## 2010-06-30 NOTE — Patient Instructions (Signed)
Today we updated your med list in our EPIC system...    We decided to INCREASE the LASIX fluid pill to twice daily; and DECREASE the MSContin pain med to 45mg  in AM & 30mg  in PM...  Today we did your follow up CXR & blood work...    Please call the [facility] in a few days for your results...    Dial [id number] & when prompted enter your patient number followed by the # symbol...    Your patient number is:  [id number]#  REMEMBER: NO SALT or sodium, keep your feet elevated, and if possible wear support hose...  Call for any questions...  Let's plan a recheck in 2 weeks> Monday 7/16, 2012 at 1:45PM..Marland Kitchen

## 2010-07-03 ENCOUNTER — Encounter: Payer: Self-pay | Admitting: Pulmonary Disease

## 2010-07-03 NOTE — Progress Notes (Signed)
Subjective:    Patient ID: Katie Galvan, female    DOB: 1927-08-27, 77 y.o.   MRN: [id number]  HPI  39 y/o WF, mother of Mylasia Vorhees, who has moved here from [facility]...  she has multiple medical problems including chronically elev right hemidiaph;  HBP;  Diastolic dysfunction & mild PulmHTN on 2DEcho;  VI & Edema;  Hx dyspagia & gastritis;  Divertics & ?colon polyp;  Hx UTI & renal failure from dehydration 3/12;  Severe DJD esp right hip/ ?CPPD/ LBP/ Chr Pain Syndrome on MS Contin;  Osteopenia;  Anemia...  ~  December 02, 2009:  New patient evaluation- she was hosp 11/3-7/11 after fall at home w/ comminuted left wrist fx & had surg by DrKKuzma > SEE INITIAL PROBLEM LIST & DISCUSSION BELOW:   ~  January 17, 2010:  She has severe DJD (esp right hip), left wrist fx & surg, +LBP, etc> on MSContin per LMD in [place] that we have been trying to wean- now down from 90mg Bid to 60mg Bid (she had some withdrawal sweats as she weaned but now improved)... family notes she is "more with it"... she had f/u Ortho, DrBlackman, & given injection in knees & hip (helped some), started on MOBIC 15mg /d as well, he is considering right THR w/ ant approach... still f/u w/ DrKuzma for wrist, getting PT/ rehab weekly, improved... she will be moving to [facility]... we discussed slowly decr MS further 60-30, 30-30, and f/u 22mo.  ~  March 24, 2010:  She was hosp 3/11 - 03/17/10 w/ UTI, dehydration & renal insuffic> cults were neg & PCT=0.22, but active sediment & given IV Rocephin in hosp;  BUN 118 =>15 & Creat 2.7 =>0.72 w/ hydration but BNP incr to 636;  Hg 10.0 =>9.1 & Fe=11 (7%sat), B12=478;  EGD by DrDBrodie showed HH & gastritis w/ neg HPylori, Rx w/ Prilosec & Carafate transiently;  Currently she feels better & looks great> right hip surg postponed & anxious to resched due to her severe pain (on MS 30+15 Bid & still in pain, wants to add back Mobic-OK);  We wanted to check labs today but  they couldn't get blood ==> reviewed all her meds, add FeSO4/ VitC, add Mobic Prn, OK for surg (check labs in hosp preop)...    HBP>  On ASA & Diltiazem 240mg  bid;  BP= 122/66 today & she denies CP, palpit, ch in SOB;  She does have incr edema since hydrated in hosp & BNP was up to 636 by disch (we discussed no salt, contin Lasix40mg /d);      Cardiac>  EKG essent WNL & 2DEcho showed mild LVH, norm sys funct w/ EF=55-65%, gr1 DD, mild MR w/ calcif annulus, Ao sclerosis w/ triv AI, mod PulmHTN w/ PAsys=46.  ~  June 30, 2010:  22mo ROV & post hosp visit>  She was hosp x2 since I saw her last >>    Hosp 4/6 - 04/18/10 by DrBlackman for right THR & required a revision of the acetabular component 4d after the initial surg...     Hosp 6/2 - 06/13/10 w/ hypoxemic acute resp failure precipitated by fluid overload & diastolic CHF (BNP=2000) in assoc w/ her chr elev right hemidiaph, RLL atx/ scarring, & scoliosis w/ multilevel degen disc dis(w/ resultant restrictive lung disease); she was also Anemic (Hg= 9-10) w/ low Fe (=10, 6%sat); and also remains on large dose narcotic analgesics for her chr pain syndrome>  Disch back to [facility] on meds as  reconciled below...     She is currently getting PT & walking w/ a walker, going about 200 ft w/ her nasal O2 at 2L/min w/ some DOE but she has been very sedentary for a long time;  She denies CP, palpit, syncope, but has some mild edema on Lasix 40mg /d; We rechecked her O2 sat= 83% RA at rest & she will need to stay on the O2 for now;  Her narcotic pain med hasn't been weaned any further since her hip surg in April> on MSContin 30+15 Bid + Percocet 5mg  prn...    LABS today>  CXR stable (elev right hemidiaph, atx, NAD);  Chems normal (Creat 0.8, BNP 95);  CBC w/ anemia (Hg 9.1, MCV 77, Fe 15), she has B pos blood> NOTE she had EGD 3/12 showing HH, gastritis, neg HPylori...    We decided to keep Lasix 40mg /d, restrict sodium etc;  Increase PPI to Bid, incr Fe w/ VitC Bid,  check stool for occult blood, consider IV Fe if not improving orally & consider further GI eval...   Problem List:    ACUTE ON CHRONIC RESP INSUFFICIENCY>  Precipitated 6/12 by diastolic CHF superimposed on her chronic resp problems etc... Now on ADVAIR100 Bid, SPIRIVA daily, O2 2L/ min... DIAPHRAGMATIC DISORDER (ICD-519.4) - she has a chronically elev right hemidiaph, apparently idiopathic; and she is mostly asymptomatic w/o cough, phlegm, hemoptysis, ch in SOB, etc... ~  11/11:  CXR showed calcif Ao, elev right hemidiaph, mild scarring at bases, NAD... ~  6/12:  Presented to ER w/ hypoxemic resp failure due to vol overload assoc w/ her restrictive lung dis & chr pain syndrome requiring narcotic analgesics...  HYPERTENSION (ICD-401.9) VALVULAR HEART DISEASE (ICD-424.90) Hx of CARDIAC ARRHYTHMIA (ICD-427.9) - she is on ASA 81mg /d, DILTIAZEM 240mg  Bid, VASOTEC 20mg /d, LASIX 40mg /d... BP today = 114/60 & she tolerates the meds well w/o apparent side effects... she denies HA, visual changes, CP, palipit, dizziness, syncope, ch in dyspnea, etc... she has been told about some type of valvular heart disease but she doesn't know the details & not sure when her last 2DEcho was done... she has never been on blood thinners etc... ~  11/11 & 6/12:  EKG showed NSR, NSSTTWA, NAD... ~  2DEcho 6/12 showed mild LVH, norm LVF w/ EF=55-60%, Gr 1 DD, trivial AI, heavily calcif mitral annulus & mild MR, PAsys est .  VENOUS INSUFFICIENCY (ICD-459.81) LEG EDEMA, CHRONIC (ICD-782.3) - she has severe venous stasis changes and chronic edema, thickened skin over LE's etc... she knows to be careful w/ hygiene, elevation, no salt, etc...  HYPERCHOLESTEROLEMIA (ICD-272.0) - she has been on MEVACOR 20mg /d from her [place] physician... we do not have Lipid Profile results to review & we have sent for old records...  OTHER DYSPHAGIA (ICD-787.29) - she is on PRILOSEC 20mg /d & reports occas dysphagia but denies choking  etc... she apparently has never had an Endoscopy or GI eval for this problem, "I have to be careful when I eat"... ~  6/12:  NOTE she had EGD 3/12 showing HH, gastritis, neg HPylori> rec increase PPI to    DIVERTICULOSIS OF COLON (ICD-562.10) Hx of COLONIC POLYPS (ICD-211.3) - she tells me that prev colonoscopy in [place] showed divertics & ?polyp but she does not recall any details...  she denies much constipation despite the narcotic analgesics- uses prunes as needed.  DEGENERATIVE JOINT DISEASE (ICD-715.90) - she has severe osteoarthritis w/ XRays 11/11 showing severe osteopenia, severe right hip degeneration & relative sparing of  the left hip joint;  DJD knees w/ ?CPPD, abn patella;  left wrist fx- radial  head & ulnar styloid... she had surg left wrist by DrKKuzma... ~  4/12:  DrBlackman did right THR but required revision of acetabular component 4d later> went to [facility] for rehab...  Hx of BACK PAIN, LUMBAR (ICD-724.2) CHRONIC PAIN SYNDROME (ICD-338.4) - she has chronic pain- mostly from her arthritic condition- and was started on MSContin by her LMD in [place] in the summer of 2011> dose slowly increased & she was on 90mg  Bid (taking a 60mg  tab + 30mg  tab Bid)... she does not believe that this med has anything to do w/ her fall & wrist fx, or her complaints of decr memory etc... we discussed the need to wean off this medication & try to deal w/ her arthritis pain thru an Orthopedist or poss a Pain Clinic... ~  11/11:  she notes pain worse during the day while up & about> try to decr MSContin to 90mg AM & 60mg PM... ~  1/12 & 4/12:  she is down to 60mg Bid & we discussed weaning further ==> she was down to 45mg  (30+15) Bid by the time of her right THR... ~  6/12:  Recommend that she try to wean further> try 45am & 30pm...  OSTEOPENIA (ICD-733.90) - XRays here showed severe osteopenia... she notes that she used to be on Fosamax but ?how long? & when stopped?... she states that she was told  her prev BMD was "good"... she has not been taking calcium supplements, but was prev on some OTC Vit D supplements... asked to resume Calcium, Women's MVI, Vit D 1000u daily...  Hx of DEPRESSION (ICD-311) - on CELEXA 20mg /d and she wants to continue this med...  ANEMIA, MILD (ICD-285.9) - Hg post op 11/11 wrist fx surg = 11.2 ~  6/12:  Labs showed Hg= 9.1, MCV= 77, Fe= 15 (7%sat), & she has B pos blood>    Past Surgical History  Procedure Date  . Appendectomy   . Breast surgery   . Right wrist surgery   . Bilateral cataracts     Outpatient Encounter Prescriptions as of 06/30/2010  Medication Sig Dispense Refill  . Calcium Carbonate-Vit D-Min (CALTRATE 600+D PLUS) 600-400 MG-UNIT per tablet Chew 1 tablet by mouth daily.        . Cholecalciferol (VITAMIN D-1000 MAX ST) 1000 UNITS tablet Take 2,000 Units by mouth daily.       . citalopram (CELEXA) 20 MG tablet Take 20 mg by mouth daily.        Marland Kitchen diltiazem (CARDIZEM CD) 240 MG 24 hr capsule 1 tablet 2 times a day       . enalapril (VASOTEC) 20 MG tablet Take 20 mg by mouth daily.        . ferrous sulfate 325 (65 FE) MG tablet Take 325 mg by mouth 2 (two) times daily.        . Fluticasone-Salmeterol (ADVAIR DISKUS) 100-50 MCG/DOSE AEPB Inhale 1 puff into the lungs every 12 (twelve) hours.        . furosemide (LASIX) 40 MG tablet Take 1 tablet (40 mg total) by mouth 2 (two) times daily. Once a day  ==> keep one tab Qam    . loratadine (CLARITIN) 10 MG tablet Take 10 mg by mouth daily.        Marland Kitchen LORazepam (ATIVAN) 0.5 MG tablet Take 0.5 mg by mouth every 8 (eight) hours. As needed for anxiety       .  lovastatin (MEVACOR) 20 MG tablet Take 20 mg by mouth at bedtime.        Marland Kitchen morphine (MSContin) 15 MG tablet Take 1 tablet (15 mg total) by mouth every morning.  ==> ch to 45mg AM & 30mg PM  0  . morphine (MSContin) 30 MG tablet Take 1 tablet (30 mg total) by mouth 2 (two) times daily.    0  . Multiple Vitamin (MULTIVITAMIN PO) Take 1 tablet by mouth  daily.        Marland Kitchen omeprazole (PRILOSEC) 20 MG capsule Take 20 mg by mouth daily.    ==> incr to Bid    . polyethylene glycol (MIRALAX / GLYCOLAX) packet Take 17 g by mouth daily.        . sennosides-docusate sodium (SENOKOT-S) 8.6-50 MG tablet Take 2 tablets by mouth 2 (two) times daily.        Marland Kitchen tiotropium (SPIRIVA) 18 MCG inhalation capsule Place 18 mcg into inhaler and inhale daily.        Marland Kitchen aspirin 81 MG EC tablet Take 81 mg by mouth daily.        Marland Kitchen morphine (KADIAN) 60 MG 24 hr capsule Take 60 mg by mouth daily.          Allergies  Allergen Reactions  . Sulfonamide Derivatives     REACTION: causes swelling    Review of Systems   Constitutional:  Denies F/C/S, anorexia, unexpected weight change. HEENT:  No HA, visual changes, earache, nasal symptoms, sore throat, hoarseness. Resp:  No cough, sputum, hemoptysis; mild SOB/ DOE noted... Cardio:  No CP, palpit, orthopnea;  +edema & DOE but limited mobility. GI:  Denies N/V/D, tends toward constip due to narcotics; swallowing OK & denies reflux or abd pain.  GU:  No dysuria, freq, urgency, hematuria, or flank pain. MS:  Severe DJD esp right hip w/ pain & decr ROM ==> s/p right THR now... Neuro:  No tremors, seizures, dizziness, syncope; +weakness & multifactorial gait abn. Skin:  No suspicious lesions or skin rash. Heme:  No adenopathy, bruising, bleeding. Psyche: Denies confusion, sleep disturbance, hallucinations; +anxiety & situational depression.    Objective:   Physical Exam    WD, WN, 77 y/o WF in mild distress from pain (right hip)... Vital Signs:  Reviewed... General:  Alert & oriented; pleasant & cooperative... HEENT:  [place]/AT, EOM-wnl, PERRLA, EACs-clear, TMs-wnl, NOSE-clear, THROAT-clear & wnl. Neck:  Supple w/ decr ROM; no JVD; normal carotid impulses w/o bruits; no thyromegaly or nodules palpated; no lymphadenopathy. Chest:  Clear to P & A; without wheezes/ rales/ or rhonchi heard... Heart:  Regular Rhythm; norm S1 &  S2 without murmurs/ rubs/ or gallops detected... Abdomen:  Soft & nontender; normal bowel sounds; no organomegaly or masses palpated... Ext:  decrROM; +deformities & mod arthritic changes; no varicose veins, +venous insuffic & chr edema;  Pulses intact w/o bruits... Neuro:  CNs intact;  No focal neuro deficits, in wheelchair & painful standing & walking... Derm:  No lesions noted; no rash etc... Lymph:  No cervical, supraclavicular, axillary, or inguinal adenopathy palpated...    Assessment & Plan:   Acute on Chr Resp Failure>  Multifactorial w/ diastolic CHF, elev right hemidiaph, atx, scoliosis, restrictive lung dis, narcotic pain meds- all playing a role... Continue O2 at 2L/min [place]...  Elev right hemidiaph>  This is chronic & assoc w/ some mild basilar atx; encouraged to get deep breaths & expand well...  HBP, etc>  May be able to decr some of her meds  in follow up...  Diastolic CHF>  BNP is much improved; continue Lasix 40mg /d, low sodium, etc...  VI, Edema>  Continue Lasix40, no salt, elevate legs, TED hose, etc...  CHOL>  Continue Mevacor & we will endeavor to check fasting blood work on return...  GERD, Dysphagia>  She has EGD in hosp w/ HH, gastritis & neg HPylori;  PPI incr to Bid...  Divertics, Constip>  Related to her narcotic analgesics;  Trying to wean further which should be poss based on the fact that the painful right ip has been replaced...  Other medical problems as noted.Marland KitchenMarland Kitchen

## 2010-07-15 ENCOUNTER — Telehealth: Payer: Self-pay | Admitting: Pulmonary Disease

## 2010-07-15 DIAGNOSIS — D649 Anemia, unspecified: Secondary | ICD-10-CM

## 2010-07-15 DIAGNOSIS — I509 Heart failure, unspecified: Secondary | ICD-10-CM

## 2010-07-15 NOTE — Telephone Encounter (Signed)
[email or web address].Jennifer Castillo, CMA  

## 2010-07-18 NOTE — Telephone Encounter (Signed)
Per SN---she will need cbcd, fe, bmp--dx anemia and chf.  thanks

## 2010-07-18 NOTE — Telephone Encounter (Signed)
Pt son wanted to know if the pt needs labs at her next OV on 07-21-10. Last ov note states to come in 2 weeks for a recheck. Does the pt need to have any fasting labs for that appt? Please advise. Carron Curie, CMA

## 2010-07-18 NOTE — Telephone Encounter (Signed)
[email or web address]  

## 2010-07-18 NOTE — Telephone Encounter (Signed)
Spoke with pt's son. He states pt wants to come in tomorrow or the next day for these labs. Orders placed in Epic. Nothing further needed.

## 2010-07-20 ENCOUNTER — Other Ambulatory Visit (INDEPENDENT_AMBULATORY_CARE_PROVIDER_SITE_OTHER): Payer: Medicare Other

## 2010-07-20 DIAGNOSIS — I509 Heart failure, unspecified: Secondary | ICD-10-CM

## 2010-07-20 DIAGNOSIS — D649 Anemia, unspecified: Secondary | ICD-10-CM

## 2010-07-20 LAB — BASIC METABOLIC PANEL
BUN: 25 mg/dL — ABNORMAL HIGH (ref 6–23)
Chloride: 101 mEq/L (ref 96–112)
Creatinine, Ser: 0.8 mg/dL (ref 0.4–1.2)
GFR: 76.43 mL/min (ref >60.00)
Potassium: 3.7 mEq/L (ref 3.5–5.1)

## 2010-07-20 LAB — CBC WITH DIFFERENTIAL/PLATELET
Basophils Absolute: 0 10*3/uL (ref 0.0–0.1)
Basophils Relative: 0.3 % (ref 0.0–3.0)
Eosinophils Absolute: 0.3 10*3/uL (ref 0.0–0.7)
Lymphocytes Relative: 7.9 % — ABNORMAL LOW (ref 12.0–46.0)
MCHC: 32.7 g/dL (ref 30.0–36.0)
MCV: 76.2 fl — ABNORMAL LOW (ref 78.0–100.0)
Monocytes Absolute: 0.9 10*3/uL (ref 0.1–1.0)
Neutrophils Relative %: 83.3 % — ABNORMAL HIGH (ref 43.0–77.0)
RDW: 21.2 % — ABNORMAL HIGH (ref 11.5–14.6)

## 2010-07-20 LAB — IRON: Iron: 24 ug/dL — ABNORMAL LOW (ref 42–145)

## 2010-07-21 ENCOUNTER — Ambulatory Visit (INDEPENDENT_AMBULATORY_CARE_PROVIDER_SITE_OTHER): Payer: Medicare Other | Admitting: Pulmonary Disease

## 2010-07-21 ENCOUNTER — Encounter: Payer: Self-pay | Admitting: Pulmonary Disease

## 2010-07-21 DIAGNOSIS — R1319 Other dysphagia: Secondary | ICD-10-CM

## 2010-07-21 DIAGNOSIS — I10 Essential (primary) hypertension: Secondary | ICD-10-CM

## 2010-07-21 DIAGNOSIS — I872 Venous insufficiency (chronic) (peripheral): Secondary | ICD-10-CM

## 2010-07-21 DIAGNOSIS — M899 Disorder of bone, unspecified: Secondary | ICD-10-CM

## 2010-07-21 DIAGNOSIS — G894 Chronic pain syndrome: Secondary | ICD-10-CM

## 2010-07-21 DIAGNOSIS — D126 Benign neoplasm of colon, unspecified: Secondary | ICD-10-CM

## 2010-07-21 DIAGNOSIS — J9620 Acute and chronic respiratory failure, unspecified whether with hypoxia or hypercapnia: Secondary | ICD-10-CM

## 2010-07-21 DIAGNOSIS — K5730 Diverticulosis of large intestine without perforation or abscess without bleeding: Secondary | ICD-10-CM

## 2010-07-21 DIAGNOSIS — E7800 Pure hypercholesterolemia, unspecified: Secondary | ICD-10-CM

## 2010-07-21 DIAGNOSIS — F329 Major depressive disorder, single episode, unspecified: Secondary | ICD-10-CM

## 2010-07-21 DIAGNOSIS — I38 Endocarditis, valve unspecified: Secondary | ICD-10-CM

## 2010-07-21 DIAGNOSIS — D649 Anemia, unspecified: Secondary | ICD-10-CM

## 2010-07-21 NOTE — Progress Notes (Signed)
Subjective:    Patient ID: Katie Galvan, female    DOB: Apr 01, 1927, 77 y.o.   MRN: [id number]  HPI 3 y/o WF, mother of Malajah Oceguera, who has moved here from [facility]...  she has multiple medical problems including chronically elev right hemidiaph;  HBP;  Diastolic dysfunction & mild PulmHTN on 2DEcho;  VI & Edema;  Hx dyspagia & gastritis;  Divertics & ?colon polyp;  Hx UTI & renal failure from dehydration 3/12;  Severe DJD- s/p R.THR/ ?CPPD/ LBP/ Chr Pain Syndrome on MS Contin;  Osteopenia;  Anemia...  ~  December 02, 2009:  New patient evaluation- she was hosp 11/3-7/11 after fall at home w/ comminuted left wrist fx & had surg by DrKuzma > SEE INITIAL PROBLEM LIST & DISCUSSION BELOW:   ~  January 17, 2010:  She has severe DJD (esp right hip), left wrist fx & surg, +LBP, etc> on MSContin per LMD in [place] that we have been trying to wean- now down from 90mg Bid to 60mg Bid (she had some withdrawal sweats as she weaned but now improved)... family notes she is "more with it"... she had f/u Ortho, DrBlackman, & given injection in knees & hip (helped some), started on MOBIC 15mg /d as well, he is considering right THR w/ ant approach... still f/u w/ DrKuzma for wrist, getting PT/ rehab weekly, improved... she will be moving to [facility]... we discussed slowly decr MS further 60-30, 30-30, and f/u 353mo.  ~  March 24, 2010:  She was hosp 3/11 - 03/17/10 w/ UTI, dehydration & renal insuffic> cults were neg & PCT=0.22, but active sediment & given IV Rocephin in hosp;  BUN 118 =>15 & Creat 2.7 =>0.72 w/ hydration but BNP incr to 636;  Hg 10.0 =>9.1 & Fe=11 (7%sat), B12=478;  EGD by DrDBrodie showed HH & gastritis w/ neg HPylori, Rx w/ Prilosec & Carafate transiently;  Currently she feels better & looks great> right hip surg postponed & anxious to resched due to her severe pain (on MS 30+15 Bid & still in pain, wants to add back Mobic-OK);  We wanted to check labs today but they  couldn't get blood ==> reviewed all her meds, add FeSO4/ VitC, add Mobic Prn, OK for surg (check labs in hosp preop)...    HBP>  On ASA & Diltiazem 240mg  bid;  BP= 122/66 today & she denies CP, palpit, ch in SOB;  She does have incr edema since hydrated in hosp & BNP was up to 636 by disch (we discussed no salt, contin Lasix40mg /d);      Cardiac>  EKG essent WNL & 2DEcho showed mild LVH, norm sys funct w/ EF=55-65%, gr1 DD, mild MR w/ calcif annulus, Ao sclerosis w/ triv AI, mod PulmHTN w/ PAsys=46.  ~  June 30, 2010:  353mo ROV & post hosp visit>  She was hosp x2 since I saw her last >>    Hosp 4/6 - 04/18/10 by DrBlackman for right THR & required a revision of the acetabular component 4d after the initial surg...     Hosp 6/2 - 06/13/10 w/ hypoxemic acute resp failure precipitated by fluid overload & diastolic CHF (BNP=2000) in assoc w/ her chr elev right hemidiaph, RLL atx/ scarring, & scoliosis w/ multilevel degen disc dis(w/ resultant restrictive lung disease); she was also Anemic (Hg= 9-10) w/ low Fe (=10, 6%sat); and also remains on large dose narcotic analgesics for her chr pain syndrome>  Disch back to [facility] on meds as reconciled below.Marland KitchenMarland Kitchen  She is currently getting PT & walking w/ a walker, going about 200 ft w/ her nasal O2 at 2L/min w/ some DOE but she has been very sedentary for a long time;  She denies CP, palpit, syncope, but has some mild edema on Lasix 40mg /d; We rechecked her O2 sat= 83% RA at rest & she will need to stay on the O2 for now;  Her narcotic pain med hasn't been weaned any further since her hip surg in April> on MSContin 30+15 Bid + Percocet 5mg  prn...    LABS today>  CXR stable (elev right hemidiaph, atx, NAD);  Chems normal (Creat 0.8, BNP 95);  CBC w/ anemia (Hg 9.1, MCV 77, Fe 15), she has B pos blood> NOTE she had EGD 3/12 showing HH, gastritis, neg HPylori...    We decided to keep Lasix 40mg /d, restrict sodium etc;  Increase PPI to Bid, incr Fe w/ VitC Bid, check  stool for occult blood, consider IV Fe if not improving orally & consider further GI eval...  ~  July 21, 2010:  3wk ROV & last visit we decided to keep LASIX 40mg /d; try to cut the MSContin from 45mg Bid to 45mg AM & 30mg PM, incr the Fe to Bid (w/ VitC), and incr the Prilosec 20mg Bid...  She reports stable & is now back home w/ family (out of skilled care & not yet ready to be on her own in [place])> they have weaned the MS[place] to 30mg AM & 30mg PM & doing satis (no Oxycodone needed);  Labs shows improved Hg= 10.0 now;  O2 sats improved> 94% RA at rest & decr to 93% on RA walking in the room;  Her edema is gone & BUN=25, Creat=0.8;  Getting home PT 3d per week;  Also BS improved to the 120-130 range...    We decided to continue current meds but decr the Diltiazem to 240mg  once daily (due to BP sl low at 102/54); and they will continue to slowly wean the MSContin...   Problem List:    ACUTE ON CHRONIC RESP INSUFFICIENCY>  Precipitated 6/12 by diastolic CHF superimposed on her chronic resp problems etc... Now on ADVAIR100 Bid, SPIRIVA daily, O2 2L/ min... DIAPHRAGMATIC DISORDER (ICD-519.4) - she has a chronically elev right hemidiaph, apparently idiopathic; and she is mostly asymptomatic w/o cough, phlegm, hemoptysis, ch in SOB, etc... ~  11/11:  CXR showed calcif Ao, elev right hemidiaph, mild scarring at bases, NAD... ~  6/12:  Presented to ER w/ hypoxemic resp failure due to vol overload assoc w/ her restrictive lung dis & chr pain syndrome requiring narcotic analgesics... ~  7/12:  O2 sats improved & OK to cut back the Oxygen to prn...  HYPERTENSION (ICD-401.9) VALVULAR HEART DISEASE (ICD-424.90) Hx of CARDIAC ARRHYTHMIA (ICD-427.9) - she is on ASA 81mg /d, DILTIAZEM 240mg  Bid, VASOTEC 20mg /d, LASIX 40mg /d... BP today = 114/60 & she tolerates the meds well w/o apparent side effects... she denies HA, visual changes, CP, palipit, dizziness, syncope, ch in dyspnea, etc... she has been told about some  type of valvular heart disease but she doesn't know the details & not sure when her last 2DEcho was done... she has never been on blood thinners etc... ~  11/11 & 6/12:  EKG showed NSR, NSSTTWA, NAD... ~  2DEcho 6/12 showed mild LVH, norm LVF w/ EF=55-60%, Gr 1 DD, trivial AI, heavily calcif mitral annulus & mild MR, PAsys est . ~  7/12:  BP on the low side at 102/54 & we decided to decr the  DILTIAZEM to 240mg /d...  VENOUS INSUFFICIENCY (ICD-459.81) LEG EDEMA, CHRONIC (ICD-782.3) - she has severe venous stasis changes and chronic edema, thickened skin over LE's etc... she knows to be careful w/ hygiene, elevation, no salt, etc...  HYPERCHOLESTEROLEMIA (ICD-272.0) - she has been on MEVACOR 20mg /d from her [place] physician... we do not have Lipid Profile results to review & we have sent for old records...  OTHER DYSPHAGIA (ICD-787.29) - she is on PRILOSEC 20mg /d & reports occas dysphagia but denies choking etc... she apparently has never had an Endoscopy or GI eval for this problem, "I have to be careful when I eat"... ~  6/12:  NOTE she had EGD 3/12 showing HH, gastritis, neg HPylori> rec increase PPI to Bid.  DIVERTICULOSIS OF COLON (ICD-562.10) Hx of COLONIC POLYPS (ICD-211.3) - she tells me that prev colonoscopy in [place] showed divertics & ?polyp but she does not recall any details...  she denies much constipation despite the narcotic analgesics- uses prunes as needed.  DEGENERATIVE JOINT DISEASE (ICD-715.90) - she has severe osteoarthritis w/ XRays 11/11 showing severe osteopenia, severe right hip degeneration & relative sparing of the left hip joint;  DJD knees w/ ?CPPD, abn patella;  left wrist fx- radial  head & ulnar styloid... she had surg left wrist by DrKKuzma... ~  4/12:  DrBlackman did right THR but required revision of acetabular component 4d later> went to [facility] for rehab...  Hx of BACK PAIN, LUMBAR (ICD-724.2) CHRONIC PAIN SYNDROME (ICD-338.4) - she has chronic  pain- mostly from her arthritic condition- and was started on MSContin by her LMD in [place] in the summer of 2011> dose slowly increased & she was on 90mg  Bid (taking a 60mg  tab + 30mg  tab Bid)... she does not believe that this med has anything to do w/ her fall & wrist fx, or her complaints of decr memory etc... we discussed the need to wean off this medication & try to deal w/ her arthritis pain thru an Orthopedist or poss a Pain Clinic... ~  11/11:  she notes pain worse during the day while up & about> try to decr MSContin to 90mg AM & 60mg PM... ~  1/12 & 4/12:  she is down to 60mg Bid & we discussed weaning further ==> she was down to 45mg  (30+15) Bid by the time of her right THR... ~  6/12:  Recommend that she try to wean further> try 45am & 30pm... ~  7/12:  She's down to 30mg  Bid & will wean further as able...  OSTEOPENIA (ICD-733.90) - XRays here showed severe osteopenia... she notes that she used to be on Fosamax but ?how long? & when stopped?... she states that she was told her prev BMD was "good"... she has not been taking calcium supplements, but was prev on some OTC Vit D supplements... asked to resume Calcium, Women's MVI, Vit D 1000u daily...  Hx of DEPRESSION (ICD-311) - on CELEXA 20mg /d and she wants to continue this med...  ANEMIA, MILD (ICD-285.9) - Hg post op 11/11 wrist fx surg = 11.2 ~  6/12:  Labs showed Hg= 9.1, MCV= 77, Fe= 15 (7%sat), & she has B pos blood> we decided to incr Fe to Bid (may need IV Fe if not responding). ~  7/12:  Labs showed Hg= 10.0, MCV= 76, she reports stool checks at [facility] were NEG...   Past Surgical History  Procedure Date  . Appendectomy   . Breast surgery   . Right wrist surgery   . Bilateral cataracts  Outpatient Encounter Prescriptions as of 07/21/2010  Medication Sig Dispense Refill  . aspirin 81 MG EC tablet Take 81 mg by mouth daily.        . Calcium Carbonate-Vit D-Min (CALTRATE 600+D PLUS) 600-400 MG-UNIT per tablet Chew 1  tablet by mouth daily.        . Cholecalciferol (VITAMIN D-1000 MAX ST) 1000 UNITS tablet Take 2,000 Units by mouth daily.       . citalopram (CELEXA) 20 MG tablet Take 20 mg by mouth daily.        Marland Kitchen diltiazem (CARDIZEM CD) 240 MG 24 hr capsule 1 tablet 2 times a day       . enalapril (VASOTEC) 20 MG tablet Take 20 mg by mouth daily.        . ferrous sulfate 325 (65 FE) MG tablet Take 325 mg by mouth 2 (two) times daily.    ==> on Fe Bid w/ VitC...    . Fluticasone-Salmeterol (ADVAIR DISKUS) 100-50 MCG/DOSE AEPB Inhale 1 puff into the lungs every 12 (twelve) hours.        . furosemide (LASIX) 40 MG tablet Take 1 tablet (40 mg total) by mouth Once a day  ==> on Lasix 40mg /d...    . loratadine (CLARITIN) 10 MG tablet Take 10 mg by mouth daily.        Marland Kitchen LORazepam (ATIVAN) 0.5 MG tablet Take 0.5 mg by mouth every 8 (eight) hours. As needed for anxiety       . lovastatin (MEVACOR) 20 MG tablet Take 20 mg by mouth at bedtime.        Marland Kitchen morphine (MSContin) 15 MG tablet Take 1 tablet (15 mg total) by mouth every morning.  ==> on 45mg  Bid (30+15)    . morphine (MSContin) 30 MG tablet Take 1 tablet (30 mg total) by mouth 2 (two) times daily.      . Multiple Vitamin (MULTIVITAMIN PO) Take 1 tablet by mouth daily.        Marland Kitchen omeprazole (PRILOSEC) 20 MG capsule Take 20 mg by mouth daily.        . polyethylene glycol (MIRALAX / GLYCOLAX) packet Take 17 g by mouth daily.        . sennosides-docusate sodium (SENOKOT-S) 8.6-50 MG tablet Take 2 tablets by mouth 2 (two) times daily.        Marland Kitchen tiotropium (SPIRIVA) 18 MCG inhalation capsule Place 18 mcg into inhaler and inhale daily.          Allergies  Allergen Reactions  . Sulfonamide Derivatives     REACTION: causes swelling    Review of Systems   Constitutional:  Denies F/C/S, anorexia, unexpected weight change. HEENT:  No HA, visual changes, earache, nasal symptoms, sore throat, hoarseness. Resp:  No cough, sputum, hemoptysis; mild SOB/ DOE  noted... Cardio:  No CP, palpit, orthopnea;  +edema & DOE but limited mobility. GI:  Denies N/V/D, tends toward constip due to narcotics; swallowing OK & denies reflux or abd pain.  GU:  No dysuria, freq, urgency, hematuria, or flank pain. MS:  Severe DJD esp right hip w/ pain & decr ROM ==> s/p right THR now... Neuro:  No tremors, seizures, dizziness, syncope; +weakness & multifactorial gait abn. Skin:  No suspicious lesions or skin rash. Heme:  No adenopathy, bruising, bleeding. Psyche: Denies confusion, sleep disturbance, hallucinations; +anxiety & situational depression.    Objective:   Physical Exam   WD, WN, 77 y/o WF in mild distress from pain (right  hip)... Vital Signs:  Reviewed... General:  Alert & oriented; pleasant & cooperative... HEENT:  [place]/AT, EOM-wnl, PERRLA, EACs-clear, TMs-wnl, NOSE-clear, THROAT-clear & wnl. Neck:  Supple w/ decr ROM; no JVD; normal carotid impulses w/o bruits; no thyromegaly or nodules palpated; no lymphadenopathy. Chest:  Clear to P & A; without wheezes/ rales/ or rhonchi heard... Heart:  Regular Rhythm; norm S1 & S2 without murmurs/ rubs/ or gallops detected... Abdomen:  Soft & nontender; normal bowel sounds; no organomegaly or masses palpated... Ext:  decrROM; +deformities & mod arthritic changes; no varicose veins, +venous insuffic & chr edema;  Pulses intact w/o bruits... Neuro:  CNs intact;  No focal neuro deficits, in wheelchair & painful standing & walking... Derm:  No lesions noted; no rash etc... Lymph:  No cervical, supraclavicular, axillary, or inguinal adenopathy palpated...    Assessment & Plan:   Acute on Chr Resp Failure>  Multifactorial w/ diastolic CHF, elev right hemidiaph, atx, scoliosis, restrictive lung dis, narcotic pain meds- all playing a role... O2 sats are now improved & she can decr the oxygen to as needed at rest & 2L/min w/ exercise...  Elev right hemidiaph>  This is chronic & assoc w/ some mild basilar atx; encouraged  to get deep breaths & expand well...  HBP, etc>  BP is actually on the low side at 102/54 & we decided to decr the Diltiazem to 240mg /d...  Diastolic CHF>  BNP is much improved; continue Lasix 40mg /d, low sodium, etc...  VI, Edema>  Continue Lasix40, no salt, elevate legs, TED hose, etc...  CHOL>  Continue Mevacor & we will endeavor to check fasting blood work on return...  GERD, Dysphagia>  She has EGD in hosp w/ HH, gastritis & neg HPylori;  PPI incr to Bid...  Divertics, Constip>  Related to her narcotic analgesics; improved w/ laxatives OTC...  DJD, s/p right THR, Chr Pain Syndrome> Trying to wean MSContin further which should be poss based on the fact that the painful right ip has been replaced...  Other medical problems as noted.Marland KitchenMarland Kitchen

## 2010-07-21 NOTE — Patient Instructions (Signed)
Today we updated your med list in EPIC...    We decided to decr the DILTIAZEM to 240mg  once daily...    Keep your other meds the same for now...  We discussed SLOWLY weaning down the MSContin from 30mg  twice daily to 30-15 if able...  Call for any problems...  Let's plan a f/u appt in 1 month or so.Marland KitchenMarland Kitchen

## 2010-07-23 ENCOUNTER — Encounter: Payer: Self-pay | Admitting: Pulmonary Disease

## 2010-07-23 NOTE — Consult Note (Signed)
NAME:  Galvan, Katie            ACCOUNT NO.:  [id number]  MEDICAL RECORD NO.:  [id number]  LOCATION:  1335                         FACILITY:  [facility]  PHYSICIAN:  [name], M.D.DATE OF BIRTH:  25-Jul-1927  DATE OF CONSULTATION:  06/08/2010 DATE OF DISCHARGE:                                CONSULTATION   REASON FOR CONSULTATION:  Leukocytosis.  CONSULTING PHYSICIAN:  [name], M.D.  PRIMARY CARE PHYSICIAN:  [name], room [phone].  HISTORY OF PRESENT ILLNESS:  Katie Galvan is a very pleasant 77 year old white female nursing home resident who was asked to see for evaluation of leukocytosis.  At the nursing home, she was deemed symptomatic for anemia with increasing shortness of breath and a CBC was performed revealing a hemoglobin of 8.  It was also noticed that her white count was elevated at 15.5.  Her MCV was 79 and her platelets were normal at 409.  In review of her prior white count records, on admission her white count was 14.3 with ANC of 11.6, then down to 11.3 on June 06, 2010 and shortly after that to 15.1 on June 08, 2010.  Her white count of note was 28.5 on April 14, 2010.  The patient is at this time comfortable and essentially asymptomatic.  She has a history of severe osteoarthritis, allergic rhinitis and sinusitis, and has undergone a hip replacement on April 08, 2010.  There is no current steroid treatments.  No palpable lymphadenopathy or splenomegaly.  She denies any current infections. Her SPEP is negative for M spike.  Her smear is pending for review.  PAST MEDICAL HISTORY: 1. Iron-deficiency anemia. 2. Mild LVH with ejection fraction 55-65% per echo on March 2012. 3. Mild MR. 4. Severe hip osteoarthritis with chronic pain syndrome. 5. Hypertension. 6. Allergic rhinitis and sinusitis. 7. Left wrist fracture in November 2011. 8. GERD - hiatal hernia. 9. Depression. 10.Cataracts. 11.Diastolic heart  failure. 12.Dyslipidemia. 13.Recent acute kidney injury in March of 2012 secondary to ACE     inhibitors. 14.Known right hemidiaphragm per chart report and per patient report. 15.History of colon polyps in the past.  SURGERIES: 1. Status post appendectomy. 2. Status post T and A. 3. Status post right total hip arthroplasty on April 08, 2010. 4. Status post revision of loose acetabular component on April 12, 2010 5. Benign breast biopsy in 1973.  ALLERGIES:  SULFA.  MEDICATIONS:  Aspirin, Os-Cal, MS Contin, D3, Celexa, B12, Tiazac, Colace, Vasotec, Lovenox, Claritin, Protonix, Zocor, MiraLax, Tylenol, Norco, Ativan, Roxicodone and Ambien.  REVIEW OF SYSTEMS:  The patient was having some dyspnea on exertion and shortness of breath which is now improved, however, she is still somewhat fatigued.  The patient denies any fever, chills, night sweats. She does have sinus headaches.  This is chronic.  No mental status changes or vision changes.  No cough, chest pain or palpitations.  No abdominal pain.  No weight loss.  No nausea, vomiting, diarrhea or constipation.  No blood in the stools or in the urine.  No gum or nosebleed.  No hemoptysis.  The patient has recently moved from [place] to live with her family after the death of  her husband six months ago. She denies any prior history of abnormal blood counts or any other blood abnormalities ever mentioned to her while living there.  FAMILY HISTORY:  Mother died with possible stroke.  Father died with heart disease.  One sister died with breast cancer.  One brother with prostate cancer.  There is no history of leukemia or bone marrow disease in the family.  SOCIAL HISTORY:  The patient is widowed for the last 6 months.  She has 2 children, one in [place] and one here in [place], son who is her main contact lives in [place], [place], but at this time she is recuperating in a nursing home after surgery.  Code status is  a fall. Never smoked.  No alcohol or recreational drug use.  Last colonoscopy was in 2004 and was normal.  PHYSICAL EXAMINATION:  GENERAL:  On physical exam, this is a well- developed, well-nourished 77 year old white female, in no acute distress, alert and oriented x3.  Blood pressure 131/72, pulse 67, respirations 20, temperature 98.4, O sats 92% on 2 L. HEENT:  Normocephalic, atraumatic.  PERRLA.  Oral cavity without thrush or lesions. NECK:  Supple.  No cervical or supraclavicular masses. LUNGS:  With minimal wheezing.  No rhonchi or rales.  No axillary masses. CARDIOVASCULAR:  Regular rate and rhythm with 1 out of 6 systolic murmur.  No rubs or gallops. ABDOMEN:  Soft, nontender.  Bowel sounds x4.  No hepatosplenomegaly. GU:  Deferred. RECTAL:  Deferred. EXTREMITIES:  With severe osteoarthritic changes in the hands.  No clubbing or cyanosis.  No edema.  No inguinal masses.  There are chronic venous stasis in both lower extremities which is chronic.  No bruising or petechial rash. BREASTS:  Not examined. NEURO:  Nonfocal.  LABORATORY DATA:  Hemoglobin 10, hematocrit 32, white count 15.1, platelets 451, MCV 80.  ANC on June 2 showed a white count of 14.3, was 11.6, lymphocytes 1.1, monocytes 1.3, iron 10, TIBC is 157, percentage saturation 6, ferritin 628. B12 304.  RBC, folate 1959.  Sodium 140, potassium 3.6, BUN 15, creatinine 0.66, glucose 95, calcium 9.6, magnesium 2.1, TSH 3.868.  BNP 2042.  ASSESSMENT AND PLAN:  Dr. Shirline Frees has seen and evaluated the patient. Briefly, this is a pleasant 77 year old woman admitted for evaluation of dyspnea and anemia, and during her evaluation, her CBC revealed elevated white count.  The patient is asymptomatic at this time.  There is no history of steroid use.  In review of note since of April 2012, the patient was noticed to have persistent leukocytosis, including some labs since 2011.  This elevated white blood cell count is likely  inflammatory in origin but cannot rule out myeloproliferative disorder.  RECOMMENDATIONS: 1. CBC with differential on a weekly basis with LDH. 2. LAP score. 3. Molecular study with PCR for BCR-ABL to rule out CML.  Dr. Shirline Frees will continue to follow up with you as needed, and will be happy to see the patient for followup at the [facility] after discharge if the PCR study is positive for BCR-ABL, otherwise, follow up with her primary care physician and thank you for the opportunity to participate in this consultation.     Marlowe Kays, PA-C   ______________________________ [name], M.D.    SW/MEDQ  D:  06/09/2010  T:  06/09/2010  Job:  [id number]  Electronically Signed by Marlowe Kays P.A. on 06/10/2010 08:31:03 AM Electronically Signed by Si Gaul MD on 07/23/2010 11:08:10 AM

## 2010-07-26 ENCOUNTER — Other Ambulatory Visit: Payer: Self-pay | Admitting: Internal Medicine

## 2010-07-26 ENCOUNTER — Telehealth: Payer: Self-pay | Admitting: Pulmonary Disease

## 2010-07-26 ENCOUNTER — Encounter (HOSPITAL_BASED_OUTPATIENT_CLINIC_OR_DEPARTMENT_OTHER): Payer: Medicare Other | Admitting: Internal Medicine

## 2010-07-26 DIAGNOSIS — D72829 Elevated white blood cell count, unspecified: Secondary | ICD-10-CM

## 2010-07-26 LAB — COMPREHENSIVE METABOLIC PANEL
ALT: 11 U/L (ref 0–35)
Albumin: 3.6 g/dL (ref 3.5–5.2)
CO2: 30 mEq/L (ref 19–32)
Calcium: 9.6 mg/dL (ref 8.4–10.5)
Chloride: 100 mEq/L (ref 96–112)
Glucose, Bld: 158 mg/dL — ABNORMAL HIGH (ref 70–99)
Sodium: 141 mEq/L (ref 135–145)
Total Protein: 6.5 g/dL (ref 6.0–8.3)

## 2010-07-26 LAB — CBC WITH DIFFERENTIAL/PLATELET
BASO%: 0.2 % (ref 0.0–2.0)
Eosinophils Absolute: 0.2 10*3/uL (ref 0.0–0.5)
HCT: 33.5 % — ABNORMAL LOW (ref 34.8–46.6)
MCHC: 31 g/dL — ABNORMAL LOW (ref 31.5–36.0)
MONO#: 0.9 10*3/uL (ref 0.1–0.9)
NEUT#: 9.5 10*3/uL — ABNORMAL HIGH (ref 1.5–6.5)
Platelets: 391 10*3/uL (ref 145–400)
RBC: 4.42 10*6/uL (ref 3.70–5.45)
WBC: 12.1 10*3/uL — ABNORMAL HIGH (ref 3.9–10.3)
lymph#: 1.5 10*3/uL (ref 0.9–3.3)

## 2010-07-26 LAB — LACTATE DEHYDROGENASE: LDH: 147 U/L (ref 94–250)

## 2010-07-26 NOTE — Telephone Encounter (Signed)
Yes ok for anything that the pt needs. thanks

## 2010-07-26 NOTE — Telephone Encounter (Signed)
Called and spoke with Katie Galvan and she is aware per SN that ok to keep the appt with Dr. Arbutus Ped to get his opinion.

## 2010-07-26 NOTE — Telephone Encounter (Signed)
Called, spoke with Katie Galvan.  She is requesting a VO for a wheeled walker with set and a bedside commode.  [name], are you ok with this?

## 2010-07-26 NOTE — Telephone Encounter (Signed)
Called and spoke with pt's daughter, Katie Galvan.  Katie Galvan states pt recently saw SN on 7/19 and was told by SN no further f/u needed with Dr. Arbutus Ped.  However, Katie Galvan states pt got a phone call last night around 7pm from Dr. Asa Lente nurse stating the recent bloodwork Dr. Arbutus Ped did showed possibility of leukemia and therefore wanted pt to f/u with Dr. Arbutus Ped.  Pt's daughter confused and is requesting SN's recs.  Please advise.  Thanks.

## 2010-07-26 NOTE — Telephone Encounter (Signed)
Called britian - lmomtcb t

## 2010-07-27 NOTE — Telephone Encounter (Signed)
LMOVM with VO for bedside commode and wheeled walker with seat.

## 2010-08-17 ENCOUNTER — Other Ambulatory Visit: Payer: Self-pay | Admitting: *Deleted

## 2010-08-17 MED ORDER — TIOTROPIUM BROMIDE MONOHYDRATE 18 MCG IN CAPS: 18.0000 ug | ORAL_CAPSULE | Freq: Every day | RESPIRATORY_TRACT | Status: DC

## 2010-08-24 ENCOUNTER — Ambulatory Visit (INDEPENDENT_AMBULATORY_CARE_PROVIDER_SITE_OTHER): Payer: Medicare Other | Admitting: Pulmonary Disease

## 2010-08-24 ENCOUNTER — Encounter: Payer: Self-pay | Admitting: Pulmonary Disease

## 2010-08-24 DIAGNOSIS — I10 Essential (primary) hypertension: Secondary | ICD-10-CM

## 2010-08-24 DIAGNOSIS — R609 Edema, unspecified: Secondary | ICD-10-CM

## 2010-08-24 DIAGNOSIS — E7800 Pure hypercholesterolemia, unspecified: Secondary | ICD-10-CM

## 2010-08-24 DIAGNOSIS — M899 Disorder of bone, unspecified: Secondary | ICD-10-CM

## 2010-08-24 DIAGNOSIS — R1319 Other dysphagia: Secondary | ICD-10-CM

## 2010-08-24 DIAGNOSIS — G894 Chronic pain syndrome: Secondary | ICD-10-CM

## 2010-08-24 DIAGNOSIS — M545 Low back pain: Secondary | ICD-10-CM

## 2010-08-24 DIAGNOSIS — D649 Anemia, unspecified: Secondary | ICD-10-CM

## 2010-08-24 DIAGNOSIS — I38 Endocarditis, valve unspecified: Secondary | ICD-10-CM

## 2010-08-24 DIAGNOSIS — K5730 Diverticulosis of large intestine without perforation or abscess without bleeding: Secondary | ICD-10-CM

## 2010-08-24 DIAGNOSIS — F329 Major depressive disorder, single episode, unspecified: Secondary | ICD-10-CM

## 2010-08-24 DIAGNOSIS — M949 Disorder of cartilage, unspecified: Secondary | ICD-10-CM

## 2010-08-24 DIAGNOSIS — M1990 Unspecified osteoarthritis, unspecified site: Secondary | ICD-10-CM

## 2010-08-24 DIAGNOSIS — J9620 Acute and chronic respiratory failure, unspecified whether with hypoxia or hypercapnia: Secondary | ICD-10-CM

## 2010-08-24 DIAGNOSIS — J986 Disorders of diaphragm: Secondary | ICD-10-CM

## 2010-08-24 DIAGNOSIS — I872 Venous insufficiency (chronic) (peripheral): Secondary | ICD-10-CM

## 2010-08-24 MED ORDER — LOVASTATIN 20 MG PO TABS: 20.0000 mg | ORAL_TABLET | Freq: Every day | ORAL | Status: DC

## 2010-08-24 MED ORDER — ENALAPRIL MALEATE 20 MG PO TABS: 20.0000 mg | ORAL_TABLET | Freq: Every day | ORAL | Status: DC

## 2010-08-24 MED ORDER — CITALOPRAM HYDROBROMIDE 20 MG PO TABS: 20.0000 mg | ORAL_TABLET | Freq: Every day | ORAL | Status: DC

## 2010-08-24 NOTE — Progress Notes (Signed)
Subjective:    Patient ID: Katie Galvan, female    DOB: [birth year], 77 y.o.   MRN: [id number]  HPI 17 y/o WF, mother of Katie Galvan, who has moved here from [facility]...  she has multiple medical problems including chronically elev right hemidiaph;  HBP;  Diastolic dysfunction & mild PulmHTN on 2DEcho;  VI & Edema;  Hx dyspagia & gastritis;  Divertics & ?colon polyp;  Hx UTI & renal failure from dehydration 3/12;  Severe DJD- s/p R.THR/ ?CPPD/ LBP/ Chr Pain Syndrome on MS Contin;  Osteopenia;  Anemia...  ~  December 02, 2009:  New patient evaluation- she was hosp 11/3-7/11 after fall at home w/ comminuted left wrist fx & had surg by DrKuzma > SEE INITIAL PROBLEM LIST & DISCUSSION BELOW:   ~  January 17, 2010:  She has severe DJD (esp right hip), left wrist fx & surg, +LBP, etc> on MSContin per LMD in [place] that we have been trying to wean- now down from 90mg Bid to 60mg Bid (she had some withdrawal sweats as she weaned but now improved)... family notes she is "more with it"... she had f/u Ortho, DrBlackman, & given injection in knees & hip (helped some), started on MOBIC 15mg /d as well, he is considering right THR w/ ant approach... still f/u w/ DrKuzma for wrist, getting PT/ rehab weekly, improved... she will be moving to [place] independ living... we discussed slowly decr MS further 60-30, 30-30, and f/u 95mo.  ~  March 24, 2010:  She was hosp 3/11 - 03/17/10 w/ UTI, dehydration & renal insuffic> cults were neg & PCT=0.22, but active sediment & given IV Rocephin in hosp;  BUN 118 =>15 & Creat 2.7 =>0.72 w/ hydration but BNP incr to 636;  Hg 10.0 =>9.1 & Fe=11 (7%sat), B12=478;  EGD by DrDBrodie showed HH & gastritis w/ neg HPylori, Rx w/ Prilosec & Carafate transiently;  Currently she feels better & looks great> right hip surg postponed & anxious to resched due to her severe pain (on MS 30+15 Bid & still in pain, wants to add back Mobic-OK);  We wanted to check labs today but they  couldn't get blood ==> reviewed all her meds, add FeSO4/ VitC, add Mobic Prn, OK for surg (check labs in hosp preop)...    HBP>  On ASA & Diltiazem 240mg  bid;  BP= 122/66 today & she denies CP, palpit, ch in SOB;  She does have incr edema since hydrated in hosp & BNP was up to 636 by disch (we discussed no salt, contin Lasix40mg /d);      Cardiac>  EKG essent WNL & 2DEcho showed mild LVH, norm sys funct w/ EF=55-65%, gr1 DD, mild MR w/ calcif annulus, Ao sclerosis w/ triv AI, mod PulmHTN w/ PAsys=46.  ~  June 30, 2010:  95mo ROV & post hosp visit>  She was hosp x2 since I saw her last >>    Hosp 4/6 - 04/18/10 by DrBlackman for right THR & required a revision of the acetabular component 4d after the initial surg...     Hosp 6/2 - 06/13/10 w/ hypoxemic acute resp failure precipitated by fluid overload & diastolic CHF (BNP=2000) in assoc w/ her chr elev right hemidiaph, RLL atx/ scarring, & scoliosis w/ multilevel degen disc dis(w/ resultant restrictive lung disease); she was also Anemic (Hg= 9-10) w/ low Fe (=10, 6%sat); and also remains on large dose narcotic analgesics for her chr pain syndrome>  Disch back to [facility] on meds as reconciled below.Marland KitchenMarland Kitchen  She is currently getting PT & walking w/ a walker, going about 200 ft w/ her nasal O2 at 2L/min w/ some DOE but she has been very sedentary for a long time;  She denies CP, palpit, syncope, but has some mild edema on Lasix 40mg /d; We rechecked her O2 sat= 83% RA at rest & she will need to stay on the O2 for now;  Her narcotic pain med hasn't been weaned any further since her hip surg in April> on MSContin 30+15 Bid + Percocet 5mg  prn...    LABS today>  CXR stable (elev right hemidiaph, atx, NAD);  Chems normal (Creat 0.8, BNP 95);  CBC w/ anemia (Hg 9.1, MCV 77, Fe 15), she has B pos blood> NOTE she had EGD 3/12 showing HH, gastritis, neg HPylori...    We decided to keep Lasix 40mg /d, restrict sodium etc;  Increase PPI to Bid, incr Fe w/ VitC Bid, check  stool for occult blood, consider IV Fe if not improving orally & consider further GI eval...  ~  July 21, 2010:  3wk ROV & last visit we decided to keep LASIX 40mg /d; try to cut the MSContin from 45mg Bid to 45mg AM & 30mg PM, incr the Fe to Bid (w/ VitC), and incr the Prilosec 20mg Bid...  She reports stable & is now back home w/ family (out of skilled care & not yet ready to be on her own in [place])> they have weaned the [place] to 30mg AM & 30mg PM & doing satis (no Oxycodone needed);  Labs shows improved Hg= 10.0 now;  O2 sats improved> 94% RA at rest & decr to 93% on RA walking in the room;  Her edema is gone & BUN=25, Creat=0.8;  Getting home PT 3d per week;  Also BS improved to the 120-130 range...    We decided to continue current meds but decr the Diltiazem to 240mg  once daily (due to BP sl low at 102/54); and they will continue to slowly wean the MSContin...  ~  August 24, 2010:  73mo ROV & she has list of questions to discuss: 1) asked about driving & I advised against it & felt she should give up her license, if it is a point of family contention then I suggested Driver's preparedness testing from [facility];  2)has some right hip pain & saw her Ortho- DrBlackman, given shot & Aleve;  3) wonders about Oxygen> using it Qhs & we will check ono, ambulated in office on RA> 92% RA at rest & 88% after 2 laps w/ walker & pulse 100...     They have decr her MSContin to 30mg  Bid and requiring zero Oxycodone in betw; DrBlackman added Aleve for her hip pain; Her BP is fine on the Diltiazem 240mg /d; she saw DrMohammed several weeks ago for a post hosp check of her anemia- labs reviewed in EPIC- "he released me"...          Problem List:    ACUTE ON CHRONIC RESP INSUFFICIENCY>  Precipitated 6/12 by diastolic CHF superimposed on her chronic resp problems etc... Now on ADVAIR100 Bid, SPIRIVA daily, O2 2L/ min... DIAPHRAGMATIC DISORDER (ICD-519.4) - she has a chronically elev right hemidiaph, apparently  idiopathic; and she is mostly asymptomatic w/o cough, phlegm, hemoptysis, ch in SOB, etc... ~  11/11:  CXR showed calcif Ao, elev right hemidiaph, mild scarring at bases, NAD... ~  6/12:  Presented to ER w/ hypoxemic resp failure due to vol overload assoc w/ her restrictive lung dis & chr pain syndrome  requiring narcotic analgesics... ~  7/12:  O2 sats improved & OK to cut back the Oxygen to prn...  HYPERTENSION (ICD-401.9) VALVULAR HEART DISEASE (ICD-424.90) Hx of CARDIAC ARRHYTHMIA (ICD-427.9) - she is on ASA 81mg /d, DILTIAZEM 240mg /d, VASOTEC 20mg /d, LASIX 40mg /d... BP today = 114/60 & she tolerates the meds well w/o apparent side effects... she denies HA, visual changes, CP, palipit, dizziness, syncope, ch in dyspnea, etc... she has been told about some type of valvular heart disease but she doesn't know the details & not sure when her last 2DEcho was done... she has never been on blood thinners etc... ~  11/11 & 6/12:  EKG showed NSR, NSSTTWA, NAD... ~  2DEcho 6/12 showed mild LVH, norm LVF w/ EF=55-60%, Gr 1 DD, trivial AI, heavily calcif mitral annulus & mild MR, PAsys est . ~  7/12:  BP on the low side at 102/54 & we decided to decr the DILTIAZEM to 240mg /d ==> stable on the lower dose.  VENOUS INSUFFICIENCY (ICD-459.81) LEG EDEMA, CHRONIC (ICD-782.3) - she has severe venous stasis changes and chronic edema, thickened skin over LE's etc... she knows to be careful w/ hygiene, elevation, no salt, etc...  HYPERCHOLESTEROLEMIA (ICD-272.0) - she has been on MEVACOR 20mg /d from her [place] physician... we do not have Lipid Profile results to review & we have sent for old records... ~  Perhaps we can try to get an FLP at [facility]...  OTHER DYSPHAGIA (ICD-787.29) - she is on PRILOSEC 20mg /d & reports occas dysphagia but denies choking etc... she apparently has never had an Endoscopy or GI eval for this problem, "I have to be careful when I eat"... ~  6/12:  NOTE she had EGD 3/12 showing  HH, gastritis, neg HPylori> rec increase PPI to Bid.  DIVERTICULOSIS OF COLON (ICD-562.10) Hx of COLONIC POLYPS (ICD-211.3) - she tells me that prev colonoscopy in [place] showed divertics & ?polyp but she does not recall any details...  she denies much constipation despite the narcotic analgesics- uses prunes as needed.  DEGENERATIVE JOINT DISEASE (ICD-715.90) - she has severe osteoarthritis w/ XRays 11/11 showing severe osteopenia, severe right hip degeneration & relative sparing of the left hip joint;  DJD knees w/ ?CPPD, abn patella;  left wrist fx- radial  head & ulnar styloid... she had surg left wrist by DrKKuzma... ~  4/12:  DrBlackman did right THR but required revision of acetabular component 4d later> went to [facility] for rehab... ~  DrBlackman continues to follow w/ shots as needed for bursitis she says...  Hx of BACK PAIN, LUMBAR (ICD-724.2) CHRONIC PAIN SYNDROME (ICD-338.4) - she has chronic pain- mostly from her arthritic condition- and was started on MSContin by her LMD in [place] in the summer of 2011> dose slowly increased & she was on 90mg  Bid (taking a 60mg  tab + 30mg  tab Bid)... she does not believe that this med has anything to do w/ her fall & wrist fx, or her complaints of decr memory etc... we discussed the need to wean off this medication & try to deal w/ her arthritis pain thru an Orthopedist or poss a Pain Clinic... ~  11/11:  she notes pain worse during the day while up & about> try to decr MSContin to 90mg AM & 60mg PM... ~  1/12 & 4/12:  she is down to 60mg Bid & we discussed weaning further ==> she was down to 45mg  (30+15) Bid by the time of her right THR... ~  6/12:  Recommend that she try to wean further>  try 45am & 30pm... ~  7/12:  She's down to 30mg  Bid & will wean further as able...  OSTEOPENIA (ICD-733.90) - XRays here showed severe osteopenia... she notes that she used to be on Fosamax but ?how long? & when stopped?... she states that she was told her prev  BMD was "good"... she has not been taking calcium supplements, but was prev on some OTC Vit D supplements... asked to resume Calcium, Women's MVI, Vit D 1000u daily...  Hx of DEPRESSION (ICD-311) - on CELEXA 20mg /d and she wants to continue this med...  ANEMIA, MILD (ICD-285.9) - Hg post op 11/11 wrist fx surg = 11.2 ~  6/12:  Labs showed Hg= 9.1, MCV= 77, Fe= 15 (7%sat), & she has B pos blood> we decided to incr Fe to Bid (may need IV Fe if not responding). ~  7/12:  Labs showed Hg= 10.0, MCV= 76, she reports stool checks at [facility] were NEG... ~  8/12: she reports that she had f/u Heme eval DrMohammed & "he released me" on Fe + VitC daily...   Past Surgical History  Procedure Date  . Appendectomy   . Breast surgery   . Right wrist surgery   . Bilateral cataracts     Outpatient Encounter Prescriptions as of 08/24/2010  Medication Sig Dispense Refill  . aspirin 81 MG EC tablet Take 81 mg by mouth daily.        . Calcium Carbonate-Vit D-Min (CALTRATE 600+D PLUS) 600-400 MG-UNIT per tablet Chew 1 tablet by mouth daily.        . Cholecalciferol (VITAMIN D-1000 MAX ST) 1000 UNITS tablet Take 2,000 Units by mouth daily.       . citalopram (CELEXA) 20 MG tablet Take 20 mg by mouth daily.        Marland Kitchen diltiazem (CARDIZEM CD) 240 MG 24 hr capsule 1 tablet by mouth once daily      . enalapril (VASOTEC) 20 MG tablet Take 20 mg by mouth daily.        . ferrous sulfate 325 (65 FE) MG tablet Take 325 mg by mouth 2 (two) times daily.        . Fluticasone-Salmeterol (ADVAIR DISKUS) 100-50 MCG/DOSE AEPB Inhale 1 puff into the lungs every 12 (twelve) hours.        . furosemide (LASIX) 40 MG tablet Take 40 mg by mouth daily.        Marland Kitchen loratadine (CLARITIN) 10 MG tablet Take 10 mg by mouth daily.        Marland Kitchen LORazepam (ATIVAN) 0.5 MG tablet Take 0.5 mg by mouth every 8 (eight) hours. As needed for anxiety       . lovastatin (MEVACOR) 20 MG tablet Take 20 mg by mouth at bedtime.        Marland Kitchen morphine (MS CONTIN) 30  MG 12 hr tablet Take as directed   on 1 tab Bid & trying to wean further...    . Multiple Vitamin (MULTIVITAMIN PO) Take 1 tablet by mouth daily.        . polyethylene glycol (MIRALAX / GLYCOLAX) packet Take 17 g by mouth daily.        Marland Kitchen tiotropium (SPIRIVA) 18 MCG inhalation capsule Place 1 capsule (18 mcg total) into inhaler and inhale daily.  30 capsule  11    Allergies  Allergen Reactions  . Sulfonamide Derivatives     REACTION: causes swelling    Current Medications, Allergies, Past Medical History, Past Surgical History, Family History, and  Social History were reviewed in [employer] record.    Review of Systems   Constitutional:  Denies F/C/S, anorexia, unexpected weight change. HEENT:  No HA, visual changes, earache, nasal symptoms, sore throat, hoarseness. Resp:  No cough, sputum, hemoptysis; mild SOB/ DOE noted... Cardio:  No CP, palpit, orthopnea;  +edema & DOE but limited mobility. GI:  Denies N/V/D, tends toward constip due to narcotics; swallowing OK & denies reflux or abd pain.  GU:  No dysuria, freq, urgency, hematuria, or flank pain. MS:  Severe DJD esp right hip w/ pain & decr ROM ==> s/p right THR now... Neuro:  No tremors, seizures, dizziness, syncope; +weakness & multifactorial gait abn. Skin:  No suspicious lesions or skin rash. Heme:  No adenopathy, bruising, bleeding. Psyche: Denies confusion, sleep disturbance, hallucinations; +anxiety & situational depression.    Objective:   Physical Exam   WD, WN, 77 y/o WF in mild distress from pain (right hip)... Vital Signs:  Reviewed... General:  Alert & oriented; pleasant & cooperative... HEENT:  [place]/AT, EOM-wnl, PERRLA, EACs-clear, TMs-wnl, NOSE-clear, THROAT-clear & wnl. Neck:  Supple w/ decr ROM; no JVD; normal carotid impulses w/o bruits; no thyromegaly or nodules palpated; no lymphadenopathy. Chest:  Clear to P & A; without wheezes/ rales/ or rhonchi heard... Heart:  Regular Rhythm;  norm S1 & S2 without murmurs/ rubs/ or gallops detected... Abdomen:  Soft & nontender; normal bowel sounds; no organomegaly or masses palpated... Ext:  decrROM; +deformities & mod arthritic changes; no varicose veins, +venous insuffic & chr edema;  Pulses intact w/o bruits... s/p right THR w/ revision; ambulates w/ walker... Neuro:  CNs intact;  No focal neuro deficits, in wheelchair & painful standing & walking... Derm:  No lesions noted; no rash etc... Lymph:  No cervical, supraclavicular, axillary, or inguinal adenopathy palpated...    Assessment & Plan:   Acute on Chr Resp Failure>  Multifactorial w/ diastolic CHF, elev right hemidiaph, atx, scoliosis, restrictive lung dis, narcotic pain meds- all playing a role... O2 sats are now improved & she can decr the oxygen to as needed at rest & 2L/min w/ exercise...  Elev right hemidiaph>  This is chronic & assoc w/ some mild basilar atx; encouraged to get deep breaths & expand well...  HBP, etc>  BP is actually stable 114/60 w/ decr of Diltiazem to 240mg /d; continue CCB, ACE, Diuretic & monitor BPs at home...  Diastolic CHF>  BNP is much improved; continue Lasix 40mg /d, low sodium, etc...  VI, Edema>  Continue Lasix40, no salt, elevate legs, TED hose, etc...  CHOL>  Continue Mevacor & we will endeavor to check fasting blood work on return...  GERD, Dysphagia>  She has EGD in hosp w/ HH, gastritis & neg HPylori;  PPI incr to Bid...  Divertics, Constip>  Related to her narcotic analgesics; improved w/ laxatives OTC...  DJD, s/p right THR, Chr Pain Syndrome> Trying to wean MSContin further which should be poss based on the fact that the painful right hip has been replaced...  Other medical problems as noted.Marland KitchenMarland Kitchen

## 2010-08-24 NOTE — Patient Instructions (Signed)
Today we updated your med list in EPIC & refilled the meds you requested...  We reviewed your recent blood work including the tests done by DrMohammed...   We rec that you decrease the IRON to one tab daily (taken w/ the vit C tab)...  Continue your other meds the same, & slowly wean the MSContin if you are able...  We will arrange for an Overnight Oximetry Study to see if you need to keep the Oxygen or not...  Let's plan a follow up visit in 2 months.Marland KitchenMarland Kitchen

## 2010-09-03 ENCOUNTER — Encounter: Payer: Self-pay | Admitting: Pulmonary Disease

## 2010-09-13 ENCOUNTER — Telehealth: Payer: Self-pay | Admitting: Pulmonary Disease

## 2010-09-13 NOTE — Telephone Encounter (Signed)
Called and spoke with Katie Galvan and she is aware that the pt will need to use oxygen 2 liters at bedtime with nasal cannula.  Pt has moved back to [facility] in the independent living so Katie Galvan will make sure that the pt is using the oxygen at bedtime.

## 2010-09-22 ENCOUNTER — Encounter: Payer: Self-pay | Admitting: Pulmonary Disease

## 2010-09-27 ENCOUNTER — Telehealth: Payer: Self-pay | Admitting: Pulmonary Disease

## 2010-09-27 DIAGNOSIS — N898 Other specified noninflammatory disorders of vagina: Secondary | ICD-10-CM

## 2010-09-27 DIAGNOSIS — N939 Abnormal uterine and vaginal bleeding, unspecified: Secondary | ICD-10-CM

## 2010-09-27 NOTE — Telephone Encounter (Signed)
We can set her up with GYN and make sure that she gets in with a female if she would like.    thanks

## 2010-09-27 NOTE — Telephone Encounter (Signed)
Spoke with pt and advised of recs per SN. She verbalized understanding and order was sent to [facility].

## 2010-09-27 NOTE — Telephone Encounter (Signed)
SN is there any female GYN doc you rec? Please advise, thanks!

## 2010-09-28 ENCOUNTER — Telehealth: Payer: Self-pay | Admitting: Pulmonary Disease

## 2010-09-28 MED ORDER — MORPHINE SULFATE CR 15 MG PO TB12: ORAL_TABLET | ORAL | Status: DC

## 2010-09-28 NOTE — Telephone Encounter (Signed)
rx has been printed and pt son is aware that rx is up front to pick up.

## 2010-09-28 NOTE — Telephone Encounter (Signed)
I spoke with [name] and he states pt needs a refill on her morphine 15 mg. He states she takes 2 in the am and 2 in the pm. They need an rx for #120. Son would like to pick this up as soon as possible. Please advise [name] if okay to refill. Thanks  [name], CMA

## 2010-10-08 ENCOUNTER — Encounter (HOSPITAL_BASED_OUTPATIENT_CLINIC_OR_DEPARTMENT_OTHER): Payer: Self-pay | Admitting: *Deleted

## 2010-10-08 ENCOUNTER — Emergency Department (HOSPITAL_BASED_OUTPATIENT_CLINIC_OR_DEPARTMENT_OTHER)
Admission: EM | Admit: 2010-10-08 | Discharge: 2010-10-09 | Disposition: A | Discharge status: Other Acute Inpatient Hospital | Payer: Medicare Other | Source: Home / Self Care | Attending: Emergency Medicine | Admitting: Emergency Medicine

## 2010-10-08 ENCOUNTER — Telehealth: Payer: Self-pay | Admitting: Internal Medicine

## 2010-10-08 DIAGNOSIS — I514 Myocarditis, unspecified: Secondary | ICD-10-CM

## 2010-10-08 DIAGNOSIS — Z79899 Other long term (current) drug therapy: Secondary | ICD-10-CM | POA: Insufficient documentation

## 2010-10-08 DIAGNOSIS — E7800 Pure hypercholesterolemia, unspecified: Secondary | ICD-10-CM | POA: Insufficient documentation

## 2010-10-08 DIAGNOSIS — K529 Noninfective gastroenteritis and colitis, unspecified: Secondary | ICD-10-CM

## 2010-10-08 DIAGNOSIS — R1110 Vomiting, unspecified: Secondary | ICD-10-CM | POA: Insufficient documentation

## 2010-10-08 DIAGNOSIS — K5289 Other specified noninfective gastroenteritis and colitis: Secondary | ICD-10-CM | POA: Insufficient documentation

## 2010-10-08 DIAGNOSIS — R197 Diarrhea, unspecified: Secondary | ICD-10-CM | POA: Insufficient documentation

## 2010-10-08 DIAGNOSIS — I10 Essential (primary) hypertension: Secondary | ICD-10-CM | POA: Insufficient documentation

## 2010-10-08 HISTORY — DX: Unspecified malignant neoplasm of skin, unspecified: C44.90

## 2010-10-08 MED ORDER — FAMOTIDINE IN NACL 20-0.9 MG/50ML-% IV SOLN
20.0000 mg | Freq: Once | INTRAVENOUS | Status: AC
  Administered 2010-10-09: 20 mg via INTRAVENOUS
  Filled 2010-10-08: qty 50

## 2010-10-08 MED ORDER — MORPHINE SULFATE 4 MG/ML IJ SOLN
4.0000 mg | Freq: Once | INTRAMUSCULAR | Status: AC
  Administered 2010-10-09: 4 mg via INTRAVENOUS
  Filled 2010-10-08: qty 1

## 2010-10-08 MED ORDER — SODIUM CHLORIDE 0.9 % IV BOLUS (SEPSIS)
500.0000 mL | Freq: Once | INTRAVENOUS | Status: AC
  Administered 2010-10-09: 500 mL via INTRAVENOUS

## 2010-10-08 MED ORDER — ONDANSETRON HCL 4 MG/2ML IJ SOLN
4.0000 mg | Freq: Once | INTRAMUSCULAR | Status: AC
  Administered 2010-10-09: 4 mg via INTRAVENOUS
  Filled 2010-10-08: qty 2

## 2010-10-08 MED ORDER — SODIUM CHLORIDE 0.9 % IV SOLN
Freq: Once | INTRAVENOUS | Status: AC
  Administered 2010-10-08: 23:00:00 via INTRAVENOUS

## 2010-10-08 MED ORDER — PANTOPRAZOLE SODIUM 40 MG IV SOLR
40.0000 mg | Freq: Once | INTRAVENOUS | Status: AC
  Administered 2010-10-09: 40 mg via INTRAVENOUS
  Filled 2010-10-08: qty 40

## 2010-10-08 MED ORDER — SODIUM CHLORIDE 0.9 % IV SOLN: Freq: Once | INTRAVENOUS | Status: DC

## 2010-10-08 NOTE — ED Notes (Signed)
MD at bedside. 

## 2010-10-08 NOTE — ED Provider Notes (Addendum)
History  Scribed for Dr. Fredricka Bonine, the patient was seen in room MH11. The chart was scribed by Gilman Schmidt. The patients care was started at 23:49. CSN: [id number] Arrival date & time: 10/08/2010 10:34 PM  Chief Complaint  Patient presents with  . Diarrhea  . Emesis    HPI Katie Galvan is a 77 y.o. female who presents to the Emergency Department complaining of diarrhea and emesis. Pt reports "not feeling well" and vomiting 4-5 times since yesterday with multiple episodes of diarrhea. Additionally notes upper abdominal pain and decreased urine output. Denies any blood in vomit or diarrhea. States she has not tried eating. Last bowel movement ~6 hours ago. There are no other associated symptoms and no other alleviating or aggravating factors.   PAST MEDICAL HISTORY:  Past Medical History  Diagnosis Date  . Disorders of diaphragm   . Unspecified essential hypertension   . Endocarditis, valve unspecified, unspecified cause   . Cardiac dysrhythmia, unspecified   . Unspecified venous (peripheral) insufficiency   . Edema   . Pure hypercholesterolemia   . Other dysphagia   . Diverticulosis of colon (without mention of hemorrhage)   . Benign neoplasm of colon   . Osteoarthrosis, unspecified whether generalized or localized, unspecified site   . Lumbago   . Chronic pain syndrome   . Disorder of bone and cartilage, unspecified   . Depressive disorder, not elsewhere classified   . Anemia, unspecified   . Skin cancer      PAST SURGICAL HISTORY:  Past Surgical History  Procedure Date  . Appendectomy   . Breast surgery   . Right wrist surgery   . Bilateral cataracts   . Hip surgery      MEDICATIONS:  Previous Medications   ASPIRIN 81 MG EC TABLET    Take 81 mg by mouth daily.     CALCIUM CARBONATE-VIT D-MIN (CALTRATE 600+D PLUS) 600-400 MG-UNIT PER TABLET    Chew 1 tablet by mouth daily.     CHOLECALCIFEROL (VITAMIN D-1000 MAX ST) 1000 UNITS TABLET    Take 2,000 Units by mouth  daily.    CITALOPRAM (CELEXA) 20 MG TABLET    Take 1 tablet (20 mg total) by mouth daily.   DILTIAZEM (CARDIZEM CD) 240 MG 24 HR CAPSULE    1 tablet by mouth once daily   ENALAPRIL (VASOTEC) 20 MG TABLET    Take 1 tablet (20 mg total) by mouth daily.   FERROUS SULFATE 325 (65 FE) MG TABLET    Take 325 mg by mouth 2 (two) times daily.     FLUTICASONE-SALMETEROL (ADVAIR DISKUS) 100-50 MCG/DOSE AEPB    Inhale 1 puff into the lungs every 12 (twelve) hours.     FUROSEMIDE (LASIX) 40 MG TABLET    Take 40 mg by mouth daily.     LORATADINE (CLARITIN) 10 MG TABLET    Take 10 mg by mouth daily.     LORAZEPAM (ATIVAN) 0.5 MG TABLET    Take 0.5 mg by mouth every 8 (eight) hours. As needed for anxiety    LOVASTATIN (MEVACOR) 20 MG TABLET    Take 1 tablet (20 mg total) by mouth at bedtime.   MORPHINE (MS CONTIN) 15 MG 12 HR TABLET    Take as directed   MORPHINE (MS CONTIN) 30 MG 12 HR TABLET    Take 30 mg by mouth 2 (two) times daily. Take as directed   MULTIPLE VITAMIN (MULTIVITAMIN PO)    Take 1 tablet by mouth daily.  POLYETHYLENE GLYCOL (MIRALAX / GLYCOLAX) PACKET    Take 17 g by mouth daily.     TIOTROPIUM (SPIRIVA) 18 MCG INHALATION CAPSULE    Place 1 capsule (18 mcg total) into inhaler and inhale daily.   VITAMIN C (ASCORBIC ACID) 500 MG TABLET    Take 500 mg by mouth daily.       ALLERGIES:  Allergies as of 10/08/2010 - Review Complete 10/08/2010  Allergen Reaction Noted  . Sulfonamide derivatives Swelling      FAMILY HISTORY:   No family history on file.   SOCIAL HISTORY: History  Substance Use Topics  . Smoking status: Never Smoker   . Smokeless tobacco: Never Used  . Alcohol Use: No     Review of Systems  Constitutional: Positive for appetite change.  HENT: Negative for sore throat.   Gastrointestinal: Positive for nausea, vomiting and diarrhea.  Genitourinary: Positive for decreased urine volume. Negative for hematuria.  All other systems reviewed and are  negative.    Allergies  Sulfonamide derivatives  Home Medications   Current Outpatient Rx  Name Route Sig Dispense Refill  . ASPIRIN 81 MG PO TBEC Oral Take 81 mg by mouth daily.      Marland Kitchen CALTRATE 600+D PLUS 600-400 MG-UNIT PO CHEW Oral Chew 1 tablet by mouth daily.      . CHOLECALCIFEROL 1000 UNITS PO TABS Oral Take 2,000 Units by mouth daily.     Marland Kitchen CITALOPRAM HYDROBROMIDE 20 MG PO TABS Oral Take 1 tablet (20 mg total) by mouth daily. 30 tablet 11  . DILTIAZEM HCL COATED BEADS 240 MG PO CP24  1 tablet by mouth once daily    . ENALAPRIL MALEATE 20 MG PO TABS Oral Take 1 tablet (20 mg total) by mouth daily. 30 tablet 11  . FERROUS SULFATE 325 (65 FE) MG PO TABS Oral Take 325 mg by mouth 2 (two) times daily.      Marland Kitchen FLUTICASONE-SALMETEROL 100-50 MCG/DOSE IN AEPB Inhalation Inhale 1 puff into the lungs every 12 (twelve) hours.      . FUROSEMIDE 40 MG PO TABS Oral Take 40 mg by mouth daily.      Marland Kitchen LORATADINE 10 MG PO TABS Oral Take 10 mg by mouth daily.      Marland Kitchen LOVASTATIN 20 MG PO TABS Oral Take 1 tablet (20 mg total) by mouth at bedtime. 30 tablet 11  . MORPHINE SULFATE CR 30 MG PO TB12 Oral Take 30 mg by mouth 2 (two) times daily. Take as directed    . MULTIVITAMIN PO Oral Take 1 tablet by mouth daily.      Marland Kitchen POLYETHYLENE GLYCOL 3350 PO PACK Oral Take 17 g by mouth daily.      Marland Kitchen TIOTROPIUM BROMIDE MONOHYDRATE 18 MCG IN CAPS Inhalation Place 1 capsule (18 mcg total) into inhaler and inhale daily. 30 capsule 11  . VITAMIN C 500 MG PO TABS Oral Take 500 mg by mouth daily.      Marland Kitchen LORAZEPAM 0.5 MG PO TABS Oral Take 0.5 mg by mouth every 8 (eight) hours. As needed for anxiety     . MORPHINE SULFATE CR 15 MG PO TB12  Take as directed 120 tablet 0    BP 145/85  Pulse 90  Temp 97.7 F (36.5 C)  Resp 20  Ht 5\' 4"  (1.626 m)  Wt 127 lb (57.607 kg)  BMI 21.80 kg/m2  SpO2 97%  Physical Exam  Constitutional: She is oriented to person, place, and time. She  appears well-developed and  well-nourished.  HENT:  Head: Normocephalic and atraumatic.  Right Ear: External ear normal.  Left Ear: External ear normal.  Mouth/Throat: Mucous membranes are dry (slight).  Eyes: Conjunctivae and EOM are normal. Pupils are equal, round, and reactive to light.       No sclera icterus   Neck: Normal range of motion and phonation normal. Neck supple.  Cardiovascular: Normal rate, regular rhythm, S1 normal and intact distal pulses.   Murmur heard.  Systolic (mild) murmur is present  Pulmonary/Chest: Effort normal and breath sounds normal. She exhibits no bony tenderness.  Abdominal: Soft. Normal appearance. There is tenderness (generalized RUQ/LUQ).       Hyperactive bowel sounds  Musculoskeletal: Normal range of motion.  Neurological: She is alert and oriented to person, place, and time. She has normal strength. No cranial nerve deficit or sensory deficit. She exhibits normal muscle tone. Coordination normal.  Skin: Skin is warm, dry and intact.  Psychiatric: She has a normal mood and affect. Her behavior is normal. Judgment and thought content normal.     ED Course  Procedures  OTHER DATA REVIEWED: Nursing notes, vital signs, and past medical records reviewed.   DIAGNOSTIC STUDIES: Oxygen Saturation is 97% on room air, normal by my interpretation.     Date: 10/09/2010  Rate: 73  Rhythm: normal sinus rhythm  QRS Axis: normal  Intervals: normal  ST/T Wave abnormalities: ST elevations diffusely  Conduction Disutrbances:none  Narrative Interpretation: EKG reviewed with cardiologist Dr. Gala Romney and is thought to represent myocarditis or pericarditis not STEMI  Old EKG Reviewed: changes noted    LABS:  Results for orders placed during the hospital encounter of 10/08/10  CBC      Component Value Range   WBC 20.8 (*) 4.0 - 10.5 (K/uL)   RBC 4.86  3.87 - 5.11 (MIL/uL)   Hemoglobin 13.3  12.0 - 15.0 (g/dL)   HCT 16.1  09.6 - 04.5 (%)   MCV 81.9  78.0 - 100.0 (fL)   MCH  27.4  26.0 - 34.0 (pg)   MCHC 33.4  30.0 - 36.0 (g/dL)   RDW 40.9 (*) 81.1 - 15.5 (%)   Platelets 346  150 - 400 (K/uL)  DIFFERENTIAL      Component Value Range   Neutrophils Relative 85 (*) 43 - 77 (%)   Neutro Abs 17.6 (*) 1.7 - 7.7 (K/uL)   Lymphocytes Relative 9 (*) 12 - 46 (%)   Lymphs Abs 1.8  0.7 - 4.0 (K/uL)   Monocytes Relative 6  3 - 12 (%)   Monocytes Absolute 1.3 (*) 0.1 - 1.0 (K/uL)   Eosinophils Relative 0  0 - 5 (%)   Eosinophils Absolute 0.0  0.0 - 0.7 (K/uL)   Basophils Relative 0  0 - 1 (%)   Basophils Absolute 0.1  0.0 - 0.1 (K/uL)  COMPREHENSIVE METABOLIC PANEL      Component Value Range   Sodium 143  135 - 145 (mEq/L)   Potassium 4.1  3.5 - 5.1 (mEq/L)   Chloride 108  96 - 112 (mEq/L)   CO2 20  19 - 32 (mEq/L)   Glucose, Bld 158 (*) 70 - 99 (mg/dL)   BUN 27 (*) 6 - 23 (mg/dL)   Creatinine, Ser 9.14  0.50 - 1.10 (mg/dL)   Calcium 78.2  8.4 - 10.5 (mg/dL)   Total Protein 6.6  6.0 - 8.3 (g/dL)   Albumin 3.7  3.5 - 5.2 (g/dL)   AST  51 (*) 0 - 37 (U/L)   ALT 16  0 - 35 (U/L)   Alkaline Phosphatase 80  39 - 117 (U/L)   Total Bilirubin 0.7  0.3 - 1.2 (mg/dL)   GFR calc non Af Amer 67 (*) >90 (mL/min)   GFR calc Af Amer 78 (*) >90 (mL/min)  LIPASE, BLOOD      Component Value Range   Lipase 9 (*) 11 - 59 (U/L)  LACTIC ACID, PLASMA      Component Value Range   Lactic Acid, Venous 1.7  0.5 - 2.2 (mmol/L)  URINALYSIS, ROUTINE W REFLEX MICROSCOPIC      Component Value Range   Color, Urine AMBER (*) YELLOW    Appearance CLOUDY (*) CLEAR    Specific Gravity, Urine 1.034 (*) 1.005 - 1.030    pH 5.0  5.0 - 8.0    Glucose, UA NEGATIVE  NEGATIVE (mg/dL)   Hgb urine dipstick MODERATE (*) NEGATIVE    Bilirubin Urine SMALL (*) NEGATIVE    Ketones, ur 15 (*) NEGATIVE (mg/dL)   Protein, ur >409 (*) NEGATIVE (mg/dL)   Urobilinogen, UA 0.2  0.0 - 1.0 (mg/dL)   Nitrite NEGATIVE  NEGATIVE    Leukocytes, UA NEGATIVE  NEGATIVE   TROPONIN I      Component Value Range    Troponin I 7.57 (*) <0.30 (ng/mL)  URINE MICROSCOPIC-ADD ON      Component Value Range   Squamous Epithelial / LPF RARE  RARE    WBC, UA 0-2  <3 (WBC/hpf)   RBC / HPF 11-20  <3 (RBC/hpf)   Casts HYALINE CASTS (*) NEGATIVE    Urine-Other AMORPHOUS URATES/PHOSPHATES        No results found.    MDM: ST elevation myocardial infarction is not thought to be the case after discussion and examination of the EKG with Dr. Gala Romney of on-call cardiology and catheter lab. The appearance of the EKG in the presentation the patient suggests myocarditis or pericarditis. Dr. Gala Romney advises admission for the patient and IV fluid rehydration. Other etiologies include gastroenteritis, pancreatitis, cholelithiasis, cholecystitis, diverticulitis, colitis, and this may be complicated by secondary dehydration. A CT scan of the patient's abdomen and pelvis will be obtained to evaluate further. I anticipate the patient will need admission.  IMPRESSION: Diagnoses that have been ruled out:  Diagnoses that are still under consideration:  Final diagnoses:    PLAN:  Home The patient is to return the emergency department if there is any worsening of symptoms. I have reviewed the discharge instructions with the patient.  CONDITION ON DISCHARGE: Stable  MEDICATIONS GIVEN IN THE E.D.  Medications  vitamin C (ASCORBIC ACID) 500 MG tablet (not administered)  0.9 %  sodium chloride infusion (not administered)  0.9 %  sodium chloride infusion (  Intravenous New Bag 10/08/10 2315)  ondansetron (ZOFRAN) injection 4 mg (4 mg Intravenous Given 10/09/10 0017)  famotidine (PEPCID) IVPB 20 mg (20 mg Intravenous New Bag 10/09/10 0028)  pantoprazole (PROTONIX) injection 40 mg (40 mg Intravenous Given 10/09/10 0022)  morphine 4 MG/ML injection 4 mg (4 mg Intravenous Given 10/09/10 0017)  sodium chloride 0.9 % bolus 500 mL (500 mL Intravenous Given 10/09/10 0000)    DISCHARGE MEDICATIONS: New Prescriptions   No  medications on file    SCRIBE ATTESTATION: I personally performed the services described in this documentation, which was scribed in my presence. The recorded information has been reviewed and considered.   I have discussed the laboratory and imaging  findings with the patient as well as my plan to have her transferred and admitted at [facility]. I will speak with the on-call hospitalist to make a hand off of the patient. At this time she is complaining of no abdominal pain and no further nausea or vomiting and is comfortable, awake, alert, and oriented appropriately. She states her understanding of the findings and her agreement with the plan of care. I specifically discussed with her the results of the troponin and EKG and my discussion with Dr. Gala Romney regarding whether or not this is a myocardial infarction, and our agreement that does not appear to be, and is likely a myocarditis.        Felisa Bonier, MD 10/09/10 1610  Felisa Bonier, MD 10/09/10 [phone]

## 2010-10-08 NOTE — ED Notes (Signed)
Pt reports "not feeling well"- states vomited 4-5 times since yesterday and also multiple episodes of diarrhe

## 2010-10-08 NOTE — Telephone Encounter (Signed)
C/o n and v and d x 48 h holding acei but not able to keep anything down at all > rec to ER ASAP

## 2010-10-09 ENCOUNTER — Other Ambulatory Visit: Payer: Self-pay

## 2010-10-09 ENCOUNTER — Emergency Department (INDEPENDENT_AMBULATORY_CARE_PROVIDER_SITE_OTHER): Payer: Medicare Other

## 2010-10-09 ENCOUNTER — Inpatient Hospital Stay (HOSPITAL_COMMUNITY)
Admission: EM | Admit: 2010-10-09 | Discharge: 2010-10-19 | DRG: 391 | Disposition: A | Discharge status: Home Health | Payer: Medicare Other | Source: Other Acute Inpatient Hospital | Attending: Internal Medicine | Admitting: Internal Medicine

## 2010-10-09 DIAGNOSIS — I319 Disease of pericardium, unspecified: Secondary | ICD-10-CM

## 2010-10-09 DIAGNOSIS — K219 Gastro-esophageal reflux disease without esophagitis: Secondary | ICD-10-CM | POA: Diagnosis present

## 2010-10-09 DIAGNOSIS — I472 Ventricular tachycardia, unspecified: Secondary | ICD-10-CM | POA: Diagnosis not present

## 2010-10-09 DIAGNOSIS — R5381 Other malaise: Secondary | ICD-10-CM | POA: Diagnosis present

## 2010-10-09 DIAGNOSIS — I300 Acute nonspecific idiopathic pericarditis: Secondary | ICD-10-CM | POA: Diagnosis present

## 2010-10-09 DIAGNOSIS — Z96649 Presence of unspecified artificial hip joint: Secondary | ICD-10-CM

## 2010-10-09 DIAGNOSIS — R112 Nausea with vomiting, unspecified: Secondary | ICD-10-CM

## 2010-10-09 DIAGNOSIS — I4729 Other ventricular tachycardia: Secondary | ICD-10-CM | POA: Diagnosis not present

## 2010-10-09 DIAGNOSIS — A09 Infectious gastroenteritis and colitis, unspecified: Principal | ICD-10-CM | POA: Diagnosis present

## 2010-10-09 DIAGNOSIS — J9819 Other pulmonary collapse: Secondary | ICD-10-CM

## 2010-10-09 DIAGNOSIS — Z7982 Long term (current) use of aspirin: Secondary | ICD-10-CM

## 2010-10-09 DIAGNOSIS — R7989 Other specified abnormal findings of blood chemistry: Secondary | ICD-10-CM

## 2010-10-09 DIAGNOSIS — I5023 Acute on chronic systolic (congestive) heart failure: Secondary | ICD-10-CM | POA: Diagnosis not present

## 2010-10-09 DIAGNOSIS — J4489 Other specified chronic obstructive pulmonary disease: Secondary | ICD-10-CM | POA: Diagnosis present

## 2010-10-09 DIAGNOSIS — F329 Major depressive disorder, single episode, unspecified: Secondary | ICD-10-CM | POA: Diagnosis present

## 2010-10-09 DIAGNOSIS — Z79899 Other long term (current) drug therapy: Secondary | ICD-10-CM

## 2010-10-09 DIAGNOSIS — I509 Heart failure, unspecified: Secondary | ICD-10-CM | POA: Diagnosis present

## 2010-10-09 DIAGNOSIS — Z882 Allergy status to sulfonamides status: Secondary | ICD-10-CM

## 2010-10-09 DIAGNOSIS — M1610 Unilateral primary osteoarthritis, unspecified hip: Secondary | ICD-10-CM | POA: Diagnosis present

## 2010-10-09 DIAGNOSIS — M169 Osteoarthritis of hip, unspecified: Secondary | ICD-10-CM | POA: Diagnosis present

## 2010-10-09 DIAGNOSIS — F3289 Other specified depressive episodes: Secondary | ICD-10-CM | POA: Diagnosis present

## 2010-10-09 DIAGNOSIS — R109 Unspecified abdominal pain: Secondary | ICD-10-CM

## 2010-10-09 DIAGNOSIS — I10 Essential (primary) hypertension: Secondary | ICD-10-CM | POA: Diagnosis present

## 2010-10-09 DIAGNOSIS — I401 Isolated myocarditis: Secondary | ICD-10-CM | POA: Diagnosis present

## 2010-10-09 DIAGNOSIS — J449 Chronic obstructive pulmonary disease, unspecified: Secondary | ICD-10-CM | POA: Diagnosis present

## 2010-10-09 LAB — CARDIAC PANEL(CRET KIN+CKTOT+MB+TROPI)
CK, MB: 19.7 ng/mL (ref 0.3–4.0)
Relative Index: 9.4 — ABNORMAL HIGH (ref 0.0–2.5)
Relative Index: 8 — ABNORMAL HIGH (ref 0.0–2.5)
Relative Index: 8.5 — ABNORMAL HIGH (ref 0.0–2.5)
Total CK: 346 U/L — ABNORMAL HIGH (ref 7–177)
Total CK: 297 U/L — ABNORMAL HIGH (ref 7–177)
Troponin I: 17.13 ng/mL (ref <0.30)
Troponin I: 14.5 ng/mL (ref <0.30)

## 2010-10-09 LAB — COMPREHENSIVE METABOLIC PANEL
AST: 51 U/L — ABNORMAL HIGH (ref 0–37)
Albumin: 3.7 g/dL (ref 3.5–5.2)
Alkaline Phosphatase: 80 U/L (ref 39–117)
BUN: 27 mg/dL — ABNORMAL HIGH (ref 6–23)
Chloride: 108 mEq/L (ref 96–112)
Potassium: 4.1 mEq/L (ref 3.5–5.1)
Sodium: 143 mEq/L (ref 135–145)
Total Bilirubin: 0.7 mg/dL (ref 0.3–1.2)
Total Protein: 6.6 g/dL (ref 6.0–8.3)

## 2010-10-09 LAB — URINALYSIS, ROUTINE W REFLEX MICROSCOPIC
Nitrite: NEGATIVE
Specific Gravity, Urine: 1.034 — ABNORMAL HIGH (ref 1.005–1.030)
Urobilinogen, UA: 0.2 mg/dL (ref 0.0–1.0)
pH: 5 (ref 5.0–8.0)

## 2010-10-09 LAB — LIPASE, BLOOD: Lipase: 9 U/L — ABNORMAL LOW (ref 11–59)

## 2010-10-09 LAB — DIFFERENTIAL
Basophils Absolute: 0.1 10*3/uL (ref 0.0–0.1)
Basophils Relative: 0 % (ref 0–1)
Eosinophils Absolute: 0 10*3/uL (ref 0.0–0.7)
Monocytes Relative: 6 % (ref 3–12)
Neutro Abs: 17.6 10*3/uL — ABNORMAL HIGH (ref 1.7–7.7)
Neutrophils Relative %: 85 % — ABNORMAL HIGH (ref 43–77)

## 2010-10-09 LAB — TSH: TSH: 2.89 u[IU]/mL (ref 0.350–4.500)

## 2010-10-09 LAB — CBC
Hemoglobin: 13.3 g/dL (ref 12.0–15.0)
MCH: 27.4 pg (ref 26.0–34.0)
MCHC: 33.4 g/dL (ref 30.0–36.0)
Platelets: 346 10*3/uL (ref 150–400)
RDW: 17.2 % — ABNORMAL HIGH (ref 11.5–15.5)

## 2010-10-09 LAB — URINE MICROSCOPIC-ADD ON

## 2010-10-09 LAB — LIPID PANEL: Total CHOL/HDL Ratio: 2.8 RATIO

## 2010-10-09 LAB — TROPONIN I: Troponin I: 7.57 ng/mL (ref <0.30)

## 2010-10-09 MED ORDER — METRONIDAZOLE IN NACL 5-0.79 MG/ML-% IV SOLN: 500.0000 mg | Freq: Once | INTRAVENOUS | Status: DC

## 2010-10-09 MED ORDER — SODIUM CHLORIDE 0.9 % IV SOLN: INTRAVENOUS | Status: DC

## 2010-10-09 MED ORDER — IOHEXOL 300 MG/ML  SOLN
100.0000 mL | Freq: Once | INTRAMUSCULAR | Status: AC | PRN
  Administered 2010-10-09: 100 mL via INTRAVENOUS

## 2010-10-09 MED ORDER — ASPIRIN 81 MG PO CHEW
324.0000 mg | CHEWABLE_TABLET | Freq: Once | ORAL | Status: AC
  Administered 2010-10-09: 324 mg via ORAL
  Filled 2010-10-09: qty 4

## 2010-10-09 MED ORDER — CIPROFLOXACIN IN D5W 400 MG/200ML IV SOLN
400.0000 mg | Freq: Once | INTRAVENOUS | Status: AC
  Administered 2010-10-09: 400 mg via INTRAVENOUS
  Filled 2010-10-09: qty 200

## 2010-10-09 NOTE — ED Notes (Signed)
I placed a call for [facility] hospitalist at [phone] and the call was returned by Dr. Brien Few at [phone]. I also placed at call to the cardiologist on call for [place], Dr. Alberteen Spindle.

## 2010-10-09 NOTE — ED Notes (Signed)
Report given to [facility], RN from [place]- states she will call [phone] and verify they will take pt then call back with room assignment

## 2010-10-09 NOTE — ED Notes (Signed)
Attempted to call family to update on pt status. Left message.

## 2010-10-10 DIAGNOSIS — I059 Rheumatic mitral valve disease, unspecified: Secondary | ICD-10-CM

## 2010-10-10 LAB — BASIC METABOLIC PANEL
CO2: 26 mEq/L (ref 19–32)
Glucose, Bld: 147 mg/dL — ABNORMAL HIGH (ref 70–99)
Potassium: 4.2 mEq/L (ref 3.5–5.1)
Sodium: 140 mEq/L (ref 135–145)

## 2010-10-10 LAB — CBC
Hemoglobin: 11.9 g/dL — ABNORMAL LOW (ref 12.0–15.0)
RBC: 4.35 MIL/uL (ref 3.87–5.11)
WBC: 13.9 10*3/uL — ABNORMAL HIGH (ref 4.0–10.5)

## 2010-10-10 LAB — DIFFERENTIAL
Basophils Absolute: 0.1 10*3/uL (ref 0.0–0.1)
Basophils Relative: 0 % (ref 0–1)
Neutro Abs: 10.5 10*3/uL — ABNORMAL HIGH (ref 1.7–7.7)
Neutrophils Relative %: 75 % (ref 43–77)

## 2010-10-10 LAB — CK TOTAL AND CKMB (NOT AT ARMC)
CK, MB: 11.8 ng/mL (ref 0.3–4.0)
Relative Index: 9 — ABNORMAL HIGH (ref 0.0–2.5)

## 2010-10-11 ENCOUNTER — Telehealth: Payer: Self-pay | Admitting: Pulmonary Disease

## 2010-10-11 LAB — CBC
Hemoglobin: 10.5 g/dL — ABNORMAL LOW (ref 12.0–15.0)
MCHC: 31.1 g/dL (ref 30.0–36.0)
Platelets: 226 10*3/uL (ref 150–400)
RBC: 3.9 MIL/uL (ref 3.87–5.11)

## 2010-10-11 LAB — CLOSTRIDIUM DIFFICILE BY PCR: Toxigenic C. Difficile by PCR: NEGATIVE

## 2010-10-11 LAB — BASIC METABOLIC PANEL
GFR calc non Af Amer: 77 mL/min — ABNORMAL LOW (ref >90)
Glucose, Bld: 99 mg/dL (ref 70–99)
Potassium: 4.2 mEq/L (ref 3.5–5.1)
Sodium: 138 mEq/L (ref 135–145)

## 2010-10-11 LAB — DIFFERENTIAL
Basophils Absolute: 0 10*3/uL (ref 0.0–0.1)
Basophils Relative: 0 % (ref 0–1)
Eosinophils Absolute: 0.5 10*3/uL (ref 0.0–0.7)
Monocytes Absolute: 1.1 10*3/uL — ABNORMAL HIGH (ref 0.1–1.0)
Neutro Abs: 6.2 10*3/uL (ref 1.7–7.7)
Neutrophils Relative %: 63 % (ref 43–77)

## 2010-10-11 MED ORDER — DILTIAZEM HCL ER COATED BEADS 240 MG PO CP24: 240.0000 mg | ORAL_CAPSULE | Freq: Every day | ORAL | Status: DC

## 2010-10-11 NOTE — Telephone Encounter (Signed)
Refill has been sent to the pharmacy.  

## 2010-10-12 DIAGNOSIS — I5021 Acute systolic (congestive) heart failure: Secondary | ICD-10-CM

## 2010-10-13 LAB — CBC
MCV: 85.9 fL (ref 78.0–100.0)
Platelets: 259 10*3/uL (ref 150–400)
RBC: 3.84 MIL/uL — ABNORMAL LOW (ref 3.87–5.11)
RDW: 16.5 % — ABNORMAL HIGH (ref 11.5–15.5)
WBC: 6.9 10*3/uL (ref 4.0–10.5)

## 2010-10-13 LAB — BASIC METABOLIC PANEL
CO2: 22 mEq/L (ref 19–32)
Chloride: 110 mEq/L (ref 96–112)
Creatinine, Ser: 0.76 mg/dL (ref 0.50–1.10)
GFR calc Af Amer: 89 mL/min — ABNORMAL LOW (ref >90)
Potassium: 4.3 mEq/L (ref 3.5–5.1)
Sodium: 140 mEq/L (ref 135–145)

## 2010-10-13 LAB — PRO B NATRIURETIC PEPTIDE: Pro B Natriuretic peptide (BNP): 9849 pg/mL — ABNORMAL HIGH (ref 0–450)

## 2010-10-13 NOTE — Consult Note (Signed)
NAME:  Katie Galvan, KOOYMAN            ACCOUNT NO.:  [id number]  MEDICAL RECORD NO.:  [id number]  LOCATION:  2028                         FACILITY:  [facility]  PHYSICIAN:  Pricilla Riffle, MD, FACCDATE OF BIRTH:  [birth year]  DATE OF CONSULTATION:  10/09/2010 DATE OF DISCHARGE:                                CONSULTATION   PRIMARY CARDIOLOGIST:  New to [facility], being seen by Dr. Tenny Craw.  PRIMARY CARE PROVIDER:  Lonzo Cloud. [name], MD  PATIENT PROFILE:  This is an 77 year old female without prior cardiac history who presents with acute nonspecific colitis and was found to have elevated cardiac enzymes, diffuse ST-segment elevation suggestive of myocarditis/pericarditis.  PROBLEM LIST: 1. Acute nonspecific colitis. 2. Presumed myopericarditis. 3. Chronic diastolic dysfunction.     a.     Two-D echocardiogram on March 14, 2010.  Ejection fraction      55-65% with grade 1 diastolic dysfunction.  Mild mitral      regurgitation, tricuspid medication.  Normal right ventricular      function. 4. Gastroesophageal reflux disease. 5. Depression. 6. Cataracts. 7. History of endocarditis. 8. Restrictive lung disease. 9. Chronically elevated right hemidiaphragm. 10.Hiatal hernia. 11.Allergic rhinitis. 12.Osteoarthritis of the right hip.     a.     Status post right total hip arthroplasty on April 08, 2010,      with revision on April 12, 2010. 13.Hypertension. 14.History of left wrist fracture and surgical repair in November     2011. 15.Status post appendectomy, tonsillectomy, and adenoidectomy. 16.Status post benign breast biopsy in 1973. 17.History of right lower lobe nodule measuring 4 mm noted in June     2012. 18.Anemia. 19.History of renal failure in the setting of dehydration in March     2012. 20.Degenerative joint disease and scoliosis. 21.History of leukocytosis. 22.History of normal stress test 5-10 years ago.  ALLERGIES:  SULFA.  HISTORY OF PRESENT ILLNESS:  This is  an 77 year old female without prior cardiac history.  She reports having had a negative stress test in [place] about 5-10 years ago.  Her activity at home is limited by chronic dyspnea on exertion related to chronically elevated right hemidiaphragm.  She also has osteoarthritis, status post right total hip arthroplasty in April 2012 and has been using a walker to get around.  Starting on Friday, October 07, 2010, the patient noted substernal chest heaviness and tenderness followed by epigastric and abdominal pain with nausea, vomiting, and diarrhea.  Symptoms persisted until Saturday, October 08, 2010, prompting her present to the [facility] where she was found to have an ECG with diffuse ST-segment elevation and elevated cardiac markers.  EKG was reviewed with Dr. Gala Romney who was on call and it was presumed that the patient had myopericarditis in the setting of acute colitis which was found by CT scan.  Supportive therapy was recommended and the patient was transferred from [facility] to [facility] for further internal medicine management of her colitis.  She is currently receiving IV fluids and antibiotics as well as bowel rest.  Her nausea, vomiting, and diarrhea has subsided for now. She continues to have mild chest heaviness that is worse with palpation.  CURRENT MEDICATIONS: 1. Aspirin 81 mg daily. 2. Cipro 200 mg IV q.12 h. 3. Advair 1 puff b.i.d. 4. Heparin 5000 units q.8 h. 5. Flagyl 500 mg IV q.8 h. 6. MS Contin 30 mg q.12 h. 7. Protonix 40 mg IV q.p.m. 8. Spiriva 18 mcg inhaled daily.  FAMILY HISTORY:  Noncontributory for early CAD.  SOCIAL HISTORY:  The patient lives with Katie Galvan.  She is widowed, but has 2 adult children.  She denies tobacco, alcohol, or drug use.  She is not routinely exercising.  REVIEW OF SYSTEMS:  Positive for chest pain/heaviness as outlined in the HPI.  She has chronic dyspnea on exertion.  She has had abdominal  pain with nausea, vomiting, and diarrhea as outlined in the HPI.  She has right hip pain and difficulty ambulating as a result of a recent hip surgeries.  She is a full code.  Otherwise, all systems reviewed are negative.  PHYSICAL EXAMINATION:  VITAL SIGNS:  Temperature 98.0, heart rate 71, respirations 18, blood pressure 116/81, and pulse ox 96% on room air. GENERAL:  A pleasant white female in no acute distress.  Awake, alert, and oriented x3.  She has a normal affect. HEENT:  Normal. NEURO:  Grossly intact and nonfocal. SKIN:  Warm, dry without lesions or masses. NECK:  Supple without bruits or JVD. LUNGS:  Respirations are regular and unlabored with diminished breath sounds at the right base, but otherwise clear to auscultation. CARDIAC:  Regular, S1 and S2.  No S3, S4, murmurs, or rubs. ABDOMEN:  Round, soft.  Diffuse tenderness.  Bowel sounds present x4. She has slight left chest wall tenderness. EXTREMITIES:  Warm, dry, pink.  No clubbing, cyanosis, or edema. Dorsalis pedis and posterior tibial pulses are 2+ and equal bilaterally.  Chest x-ray shows persistent marked elevation of right hemidiaphragm with associated atelectasis or scarring.  CT of the abdomen and pelvis shows nonspecific colitis.  EKG shows sinus rhythm, rate of 75, right axis, 1-mm ST-segment elevation in leads II, III, aVF, V4-V6 and 2-mm ST- elevation in V2 and V3.  Hemoglobin 13.3, hematocrit 39.8, WBC 20.8, and platelets 346.  Sodium 143, potassium 4.1, chloride 108, CO2 of 20, BUN 27, creatinine 0.80, glucose 158, total bilirubin 0.7, alkaline phosphatase 80, AST 51, ALT 16, total protein 6.6, albumin 3.7, lipase 9, calcium 10, lactate 1.7.  CK 362, MB 34.2, and troponin I of 17.13. Total cholesterol 146, triglycerides 98, HDL 53, and LDL 73.  ASSESSMENT/PLAN: 1. Presumed myopericarditis.  The patient has had greater than 48     hours of constant chest pain and heaviness in the setting of      nonspecific colitis with nausea, vomiting, diarrhea, and abdominal     pain.  ECG shows diffuse ST-segment elevation and elevated cardiac     enzymes have been noted.  Check 2-D echocardiogram as ordered.     Continue cycle enzymes.  Daily ECGs.  She is not a candidate for     NSAIDs secondary to colitis, therefore we will try colchicine 0.6     mg daily.  We will have to watch for worsening diarrhea.  Further     recommendations following echocardiogram. 2. Hypertension, stable. 3. Acute nonspecific colitis, per Internal Medicine.     Nicolasa Ducking, ANP   ______________________________ Pricilla Riffle, MD, [facility]    CB/MEDQ  D:  10/09/2010  T:  10/09/2010  Job:  [id number]  Electronically Signed by Nicolasa Ducking ANP on 10/12/2010 07:17:43 PM Electronically Signed  by Dietrich Pates MD [facility] on 10/13/2010 10:25:11 AM

## 2010-10-13 NOTE — H&P (Signed)
NAME:  Katie Galvan, Katie Galvan            ACCOUNT NO.:  [id number]  MEDICAL RECORD NO.:  [id number]  LOCATION:  2028                         FACILITY:  [facility]  PHYSICIAN:  Isidor Holts, M.D.  DATE OF BIRTH:  20-Apr-1927  DATE OF ADMISSION:  10/09/2010 DATE OF DISCHARGE:                             HISTORY & PHYSICAL   PRIMARY MEDICAL DOCTOR:  Lonzo Cloud. [name], MD.  CHIEF COMPLAINT:  Vomiting, diarrhea, and abdominal pain, for about 4-5 days.  HISTORY OF PRESENT ILLNESS:  This is an 77 year old female.  She is actually a very good historian.  According to her, over the past 4-5 days, she has experienced nausea, vomiting, and abdominal pain across the lower part of her abdomen.  Vomiting has been about 5-6 times per day.  Diarrhea is also of the same duration of time.  Denies coffee-ground emesis.  Denies hematemesis.  Stools appear to be watery.  She has had no fever or chills or shortness of breath.  In the past 1 day, however, she has developed retrosternal discomfort.  It is nonpleuritic.  The patient presented to [facility] and was subsequently transferred to [facility].  PAST MEDICAL HISTORY: 1. Severe osteoarthritis of the hip. 2. Status post right total hip replacement on April 08, 2010, followed     by revision of loose acetabular component on April 12, 2010. 3. Hypertension. 4. Allergic rhinosinusitis 5. Status post left wrist fracture in November 2011. 6. Status post appendectomy. 7. Status post tonsillectomy. 8. Status post adenoidectomy. 9. Hiatal hernia. 10.GERD. 11.Depression. 12.Cataracts. 13.History of diastolic dysfunction, ejection fraction 55%-65% per 2-D     echocardiogram in September 2012. 14.History of benign breast biopsy in 1973. 15.COPD. 16.Chronic elevation of right hemidiaphragm. 17.Right lower lobe lung nodule, 4 mm on chest CT scan on June 10, 2010. 18.Previous history of endocarditis.  ALLERGIES:  SULFA.  MEDICATION  HISTORY: 1. Caltrate 600 plus D 400 units 1 p.o. daily. 2. Cholecalciferol 1000 units p.o. daily. 3. Advair Diskus (100/50) 1 puff b.i.d. 4. Aspirin enteric coated 81 mg p.o. daily. 5. Citalopram 20 mg p.o. daily. 6. Cardizem CD 240 mg p.o. daily. 7. Enalapril 20 mg p.o. daily. 8. Ferrous sulfate 325 mg p.o. daily. 9. Lasix 40 mg p.o. daily. 10.Claritin 10 mg p.o. daily. 11.Lovastatin 20 mg p.o. daily nightly. 12.MS Contin 30 mg p.o. q.12 hourly. 13.MiraLax 17 g p.o. daily. 14.Brovana inhaler (18 mcg) 1 capsule inhaled daily. 15.Lorazepam 0.5 mg (?) frequency.  Note, this medication list may be     incomplete and will be updated in due course by clinical     pharmacologist.  REVIEW OF SYSTEMS:  As per HPI and chief complaint, otherwise negative.  SOCIAL HISTORY:  The patient is widowed, has 2 offsprings.  Nonsmoker. Nondrinker.  Has no history of drug abuse.  FAMILY HISTORY:  Noncontributory.  PHYSICAL EXAMINATION:  VITAL SIGNS:  Temperature 98.0, pulse 79 per minute and regular, respiratory rate 18, BP 116/81 mmHg, and pulse oximeter 96% on room air. GENERAL:  The patient did not appear to be in obvious acute distress at the time of this evaluation.  Alert, communicative.  Complaining of mild retrosternal discomfort,  not short of breath at rest. HEENT:  No clinical pallor, no jaundice, no conjunctival injection. Hydration status seems fair. NECK:  Supple.  JVP not seen.  No palpable lymphadenopathy.  No palpable goiter. CHEST:  Clinically clear to auscultation.  No wheezes, no crackles. HEART:  Heart sounds 1 and 2 heard, normal, regular.  No murmurs. ABDOMEN:  Full, soft, and nontender.  No palpable organomegaly.  No palpable masses.  Normal bowel sounds. EXTREMITIES:  Lower extremity examination shows no pitting edema. Palpable peripheral pulses. MUSCULOSKELETAL:  Osteoarthritic changes are noted. CENTRAL NERVOUS SYSTEM:  No focal neurologic deficit on  gross examination.  INVESTIGATIONS:  CBC:  WBC 20.8, hemoglobin 13.3, hematocrit 39.8, and platelets 348.  Electrolytes:  Sodium 143, potassium 4.1, chloride 108, CO2 of 20.  BUN 27, creatinine 0.80, and glucose 158.  Troponin I point- of-care 17.2, lipase 9.  Urinalysis:  Wbc's 0-2, rbc 11-20.  Chest x-ray on October 09, 2010, shows persistent marked elevation of right hemidiaphragm, otherwise no acute cardiopulmonary findings. Abdominal/pelvic CT scan on October 09, 2010, showed colonic wall thickening from the rectum to the level of the hepatic flexure consistent with nonspecific colitis.  ASSESSMENT AND PLAN: 1. Acute colitis, etiology uncertain at the present time.  We shall     manage with bowel rest, intravenous fluid hydration,     antiemetics, proton pump inhibitor.  Ciprofloxacin has already been     commenced in the emergency department. We shall and add Flagyl and     do stool studies.  2. Chest pain.  The patient has retrosternal discomfort.  A 12-lead     EKG done on October 09, 2010, shows at least 1-mm ST elevation     involving the second and third standard leads as well as aVF, also     anterolateral leads from V1-V6, but particularly obvious in the     lateral leads.  According to Dr. Fredricka Bonine, ED MD at [facility], he has discussed this extensively with Dr. Arvilla Meres,     cardiologist and prevailing opinion is that the culprit is likely a     myocarditis versus pericarditis.  In view of this, there is an     anticipated elevation of cardiac enzymes.  We shall commence the     patient on analgesic medications, continue low-dose aspirin.  We     are unable to utilize NSAIDS at the present time, because of the     patient's symptoms of vomiting.  We will shall continue cycling     cardiac enzymes.  We will arrange 2-D echocardiogram and certainly     involve the Cardiology Team in management of this patient.  3. Hypertension.  This appears  controlled.  4. Hiatal hernia/gastroesophageal reflux disease.  We shall manage     with proton pump inhibitor.  5. Chronic obstructive pulmonary disease.  This is asymptomatic.   Further management will depend on clinical course.     Isidor Holts, M.D.     CO/MEDQ  D:  10/09/2010  T:  10/09/2010  Job:  [id number]  cc:   Lonzo Cloud. [name], MD  Electronically Signed by Isidor Holts M.D. on 10/13/2010 07:21:11 PM

## 2010-10-14 LAB — BASIC METABOLIC PANEL
CO2: 30 mEq/L (ref 19–32)
Chloride: 107 mEq/L (ref 96–112)
Chloride: 104 mEq/L (ref 96–112)
GFR calc Af Amer: 66 mL/min — ABNORMAL LOW (ref >90)
GFR calc Af Amer: 72 mL/min — ABNORMAL LOW (ref >90)
GFR calc non Af Amer: 57 mL/min — ABNORMAL LOW (ref >90)
Potassium: 4.5 mEq/L (ref 3.5–5.1)
Sodium: 139 mEq/L (ref 135–145)
Sodium: 141 mEq/L (ref 135–145)

## 2010-10-14 LAB — PRO B NATRIURETIC PEPTIDE: Pro B Natriuretic peptide (BNP): 10786 pg/mL — ABNORMAL HIGH (ref 0–450)

## 2010-10-15 LAB — BASIC METABOLIC PANEL
Calcium: 9 mg/dL (ref 8.4–10.5)
GFR calc non Af Amer: 60 mL/min — ABNORMAL LOW (ref >90)
Potassium: 4.3 mEq/L (ref 3.5–5.1)
Sodium: 143 mEq/L (ref 135–145)

## 2010-10-15 LAB — PRO B NATRIURETIC PEPTIDE: Pro B Natriuretic peptide (BNP): 8023 pg/mL — ABNORMAL HIGH (ref 0–450)

## 2010-10-17 ENCOUNTER — Encounter (HOSPITAL_COMMUNITY): Payer: Medicare Other

## 2010-10-17 LAB — BASIC METABOLIC PANEL
Chloride: 102 mEq/L (ref 96–112)
GFR calc Af Amer: >90 mL/min (ref >90)
GFR calc non Af Amer: 78 mL/min — ABNORMAL LOW (ref >90)
Potassium: 4.1 mEq/L (ref 3.5–5.1)
Sodium: 140 mEq/L (ref 135–145)

## 2010-10-17 LAB — PRO B NATRIURETIC PEPTIDE: Pro B Natriuretic peptide (BNP): 6442 pg/mL — ABNORMAL HIGH (ref 0–450)

## 2010-10-17 LAB — URIC ACID: Uric Acid, Serum: 7.7 mg/dL — ABNORMAL HIGH (ref 2.4–7.0)

## 2010-10-18 ENCOUNTER — Telehealth: Payer: Self-pay | Admitting: Pulmonary Disease

## 2010-10-18 LAB — PRO B NATRIURETIC PEPTIDE: Pro B Natriuretic peptide (BNP): 8491 pg/mL — ABNORMAL HIGH (ref 0–450)

## 2010-10-18 LAB — BASIC METABOLIC PANEL
Calcium: 9.6 mg/dL (ref 8.4–10.5)
GFR calc Af Amer: >90 mL/min (ref >90)
GFR calc non Af Amer: 79 mL/min — ABNORMAL LOW (ref >90)
Sodium: 140 mEq/L (ref 135–145)

## 2010-10-18 NOTE — Telephone Encounter (Signed)
Called, spoke with pt's son, [name].  States pt is in [facility] for the past 10 days.  He has spoken with the admitting dr but states he has not spoke with a dr in 4-5 days.  States he has asked the nurse to have the dr call him but this has not happened.  He would like "another set of eyes" regarding this because "my comfort level is not real high."  [name] states pt is supposed to be discharged tonight or tomorrow to [facility] assisted Living.  He is requesting [name] take a look at this and give his thoughts.  [name], pls advise.  Thanks!

## 2010-10-18 NOTE — Telephone Encounter (Signed)
Called, spoke with Katie Galvan.  Per Katie Galvan, she has now gotten her problem resolved because per Katie Galvan, Katie Galvan has been admitted to skilled unit at [facility] [place].  States if anything further is needed she will call back.

## 2010-10-18 NOTE — Telephone Encounter (Signed)
SN has this msg nothing further needed--per sn

## 2010-10-19 LAB — HEPARIN LEVEL (UNFRACTIONATED): Heparin Unfractionated: <0.1 IU/mL — ABNORMAL LOW (ref 0.30–0.70)

## 2010-10-19 NOTE — Discharge Summary (Signed)
NAME:  Katie Galvan, Katie Galvan            ACCOUNT NO.:  [id number]  MEDICAL RECORD NO.:  [id number]  LOCATION:  2028                         FACILITY:  [facility]  PHYSICIAN:  Andreas Blower, MD       DATE OF BIRTH:  [birth year]  DATE OF ADMISSION:  10/09/2010 DATE OF DISCHARGE:                        DISCHARGE SUMMARY - REFERRING   ADDENDUM:  Please refer to discharge summary dictated on October 13, 2010, and October 15, 2010, by Dr. Ardyth Harps for more details.  PRIMARY CARE PHYSICIAN:  Lonzo Cloud. [name], MD  CARDIOLOGY:  [facility].  The patient is scheduled to see Dr. Gala Romney on October 24, 2010.  DISCHARGE DIAGNOSES: 1. Colitis presumed infectious improving.  The patient is to complete     antibiotics for 4 more days to complete a 14 day course. 2. Presumed myopericarditis with compensated systolic congestive heart     failure with an ejection fraction of 30% during this admission. 3. History of chronic obstructive pulmonary disease, stable. 4. Gastroesophageal reflux disease. 5. History of depression. 6. History of cataract. 7. History of diastolic dysfunction previously with an ejection     fraction of 55-65% per two-D echocardiogram in September 2012. 8. History of severe arthritis of the hip. 9. Status post right total hip replacement on April 08, 2010, followed     by revision of a loose acetabular component on April 12, 2010. 10.History of hypertension. 11.History of allergic rhinosinusitis. 12.Status post left wrist fracture in November 06, 2009. 13.Status post appendectomy. 14.Status post tonsillectomy. 15.Status post adenoidectomy. 16.History of hiatal hernia. 17.Cataracts. 18.History of benign breast biopsy in 1973. 19.Chronic elevation of right hemidiaphragm. 20.Right lower lobe nodule measuring 4 mm on June 10, 2010.  DISCHARGE MEDICATIONS: 1. Ciprofloxacin 250 mg p.o. twice daily for 4 more days. 2. Furosemide 20 mg p.o. daily. 3. Lisinopril 2.5 mg p.o.  daily. 4. Metronidazole 500 mg p.o. 3 times a day. 5. Oxycodone 5 mg IR every 4 hours as needed for pain. 6. Advair Diskus 1 puff twice daily. 7. Aspirin 81 mg p.o. daily. 8. Calcium with vitamin D 600/400 one tablet p.o. twice daily. 9. Citalopram 20 mg p.o. q.a.m. 10.Ferrous sulfate 325 mg p.o. twice daily. 11.Loratadine 10 mg p.o. q.a.m. 12.Losartan 10 mg p.o. daily at bedtime. 13.MS Contin 30 mg p.o. twice daily. 14.Multivitamin 1 tablet p.o. daily. 15.Spiriva 18 mcg 1 capsule inhaled daily. 16.Vitamin C 500 mg p.o. daily. 17.Vitamin D3 2000 units p.o. q. p.m.  BRIEF ADMITTING HISTORY AND PHYSICAL:  Katie Galvan is an 77 year old Caucasian female who presented on October 09, 2010, with complaints of nausea, vomiting, diarrhea, and abdominal pain.  RADIOLOGY/IMAGINGS:  The patient had chest x-ray two view on October 09, 2010, which showed persistent marked elevation of the right hemidiaphragm with associated atelectasis or scarring, otherwise, no acute process.  The patient had CT of the abdomen and pelvis with contrast, which shows nonspecific colitis with infectious, inflammatory, and ischemic etiology in the differential.  Low attenuation adjacent to or involving the uncinate process is indeterminate can be characterized further with pancreatic MRI.  LABORATORY DATA:  Electrolytes normal with a BUN of 9, creatinine 0.69.  HOSPITAL COURSE:  Please refer to discharge summary dictated on October 13, 2010, and October 15, 2010, by Dr. Ardyth Harps for more details.  ADDENDUM:  Subsequently, after discharge summary on October 13, 2010 and October 15, 2010, the patient had been evaluated for decompensated systolic heart failure, presumed to be from viral myopericarditis.  The patient was initially started on IV Lasix which was transitioned to p.o. The patient was also started on lisinopril 2.5 mg p.o. daily.  The patient to follow up with Cardiology after discharge.  The  patient continued to have intermittent episodes of diarrhea presumed to be infectious.  The patient initially was started on empiric Cipro and Flagyl.  The patient will be on antibiotics for 4 more days to complete a course.  C. diff PCR was checked and was negative.  DISPOSITION AND FOLLOWUP:  The patient to follow up with Dr. Gala Romney with Cardiology on October 24, 2010, at 10 a.m.  The patient to follow up with [name], her primary care physician in about 1-2 weeks.  Time spent on discharge talking to the patient and coordinating care was 25 minutes.     Andreas Blower, MD    SR/MEDQ  D:  10/19/2010  T:  10/19/2010  Job:  [id number]  Electronically Signed by Wardell Heath Solenne Manwarren  on 10/19/2010 08:59:36 PM

## 2010-10-24 ENCOUNTER — Encounter (HOSPITAL_COMMUNITY): Payer: Self-pay

## 2010-10-24 ENCOUNTER — Inpatient Hospital Stay (HOSPITAL_COMMUNITY): Admit: 2010-10-24 | Payer: Medicare Other

## 2010-10-26 ENCOUNTER — Ambulatory Visit: Payer: Medicare Other | Admitting: Pulmonary Disease

## 2010-10-28 NOTE — Discharge Summary (Signed)
NAME:  Katie Galvan, Katie Galvan            ACCOUNT NO.:  [id number]  MEDICAL RECORD NO.:  [id number]  LOCATION:  2028                         FACILITY:  [facility]  PHYSICIAN:  Peggye Pitt, M.D. DATE OF BIRTH:  01-31-1927  DATE OF ADMISSION:  10/09/2010 DATE OF DISCHARGE:  10/13/2010                              DISCHARGE SUMMARY   PRIMARY CARE PHYSICIAN:  Lonzo Cloud. [name], MD  DISCHARGE DIAGNOSES: 1. Colitis, likely infectious, improving. 2. Likely viral myocarditis/pericarditis with compensated systolic     congestive heart failure, ejection fraction of 30%. 3. Chronic obstructive pulmonary disease, stable this admission. 4. Gastroesophageal reflux disease.  DISCHARGE MEDICATIONS: 1. Cipro 250 mg twice daily for 11 days. 2. Flagyl 500 mg 3 times a day for 11 days. 3. Lasix 20 mg daily. 4. Lisinopril 2.5 mg daily. 5. Oxycodone 5 mg every 4 hours as needed for pain. 6. Advair 100/50 one puff twice daily. 7. Aspirin 81 mg daily. 8. Calcium 1 tablet twice daily. 9. Celexa 20 mg daily. 10.Ferrous sulfate 325 mg twice daily. 11.Loratadine 10 mg daily. 12.Lovastatin 20 mg at bedtime. 13.MS Contin 30 mg twice daily. 14.Multivitamin 1 tablet daily. 15.Spiriva 18 mcg inhaled daily. 16.Vitamin C 500 mg daily. 17.Vitamin D3 1000 units 2 tablets in the evening.  DISPOSITION AND FOLLOWUP:  Katie Galvan will be discharged home today in stable and improved condition.  She will be set up with home health PT and OT to help with her deconditioning as well as an Char[employer] fundraiser for disease management with her CHF.  She has been set up with an appointment with Dr. Gala Romney at the [facility] on Monday 15th at 10:45 am. She has also been instructed to schedule a followup appointment with her primary care physician Dr. [name].  CONSULTATION THIS HOSPITALIZATION:  Rollene Rotunda, MD, [facility] with Cardiology.  IMAGES AND PROCEDURES: 1. Chest x-ray on October 7 that showed persistent marked  elevation of     the right hemidiaphragm with associated atelectasis or scarring.     Otherwise, no acute process. 2. A CT scan of the abdomen and pelvis with contrast on October 7 that     showed nonspecific colitis with infectious, inflammatory, and     ischemic etiologies in the differential.  HISTORY AND PHYSICAL:  For complete details, please refer the dictation on October 7 by Dr. Brien Few; however, in brief Katie Galvan is a pleasant 77 year old white woman who presented to the hospital with vomiting, diarrhea, and abdominal pain for 4-5 days.  A CT scan of the abdomen showed evidence for colitis.  She also developed some retrosternal discomfort while in the emergency department.  An EKG showed an elevated ST segments and Cardiology was consulted.  We were asked to admit her for further evaluation and management.  HOSPITAL COURSE BY PROBLEM: 1. Colitis.  This is likely infectious in origin.  She has been     started on Cipro and Flagyl.  She has had good improvement while in     the hospital, is not tolerating a solid diet.  We will send her     home with 11 more days of Cipro and Flagyl to complete a total of 2  weeks of treatment.  She will need to follow up with her PCP on     discharge. 2. Likely viral myocarditis/pericarditis.  [facility] has been     involved.  A 2D echo was obtained that showed an ejection fraction     of 30%.  This is new as her prior echo showed an ejection fraction     of 55-60%.  Her blood pressure has been marginal, so this has     difficult to titration of appropriate medications.  She has been     started on a low-dose ACE inhibitor 2.5 mg.  She is also maintained     on an aspirin and a statin.  However, we have been able to start a     beta-blocker at this time given her blood pressures.  She will need     followup with the [facility] to further titrate her CHF     medications and to monitor for disease management. 3. COPD.   This has been stable this hospitalization and she has been     continued on her home nebulizers. 4. Deconditioning.  She is very weak.  We have arranged for home     health PT and OT at time of discharge. 5. Rest of chronic medical conditions are stable.  VITALS ON DAY OF DISCHARGE:  Blood pressure 112/67, heart rate 70, respirations 14, sats 96% on 2 L, and a temp 96.9.     Peggye Pitt, M.D.     EH/MEDQ  D:  10/13/2010  T:  10/13/2010  Job:  [id number]  cc:   Lonzo Cloud. [name], MD Rollene Rotunda, MD, [facility]  Electronically Signed by Peggye Pitt M.D. on 10/28/2010 03:54:58 PM

## 2010-10-28 NOTE — Discharge Summary (Signed)
  NAME:  Katie Galvan, Katie Galvan            ACCOUNT NO.:  [id number]  MEDICAL RECORD NO.:  [id number]  LOCATION:  2028                         FACILITY:  [facility]  PHYSICIAN:  Peggye Pitt, M.D. DATE OF BIRTH:  [birth year]  DATE OF ADMISSION:  10/09/2010 DATE OF DISCHARGE:  10/15/2010                        DISCHARGE SUMMARY - REFERRING   ADDENDUM  After being evaluated by Cardiology on October 13, 2010, they believe that she had more of decompensated systolic CHF and decided to treat her with a few more doses of Lasix.  A BNP was greater than 10,000.  Her BNP has now decreased to 8000.  She does have some mild bibasilar crackles, however, her lower extremity edema has improved.  Her breathing status is at baseline.  She is not complaining of any chest pains.  She wears O2 chronically at home for her COPD and has been maintaining good sats on her home regimen of oxygen.  I believe that she is stable for discharge home today pending Cardiology evaluation later this afternoon.  DISCHARGE MEDICATIONS:  Remain the same as previously dictated.  DISCHARGE DIAGNOSES: 1. Colitis, likely infectious in origin, improving. 2. Likely viral myocarditis/pericarditis with decompensated systolic     congestive heart failure with ejection fraction of 30%. 3. Chronic obstructive pulmonary disease, stable this admission, on O2     at home. 4. Gastroesophageal reflux disease.  DISPOSITION:  Please note as stated before, the patient has an appointment with Dr. Arvilla Meres in the [facility] Clinic scheduled for Monday October 17, 2010, at 10:45 a.m.     Peggye Pitt, M.D.     EH/MEDQ  D:  10/15/2010  T:  10/15/2010  Job:  [id number]  Electronically Signed by Peggye Pitt M.D. on 10/28/2010 03:55:50 PM

## 2010-11-02 ENCOUNTER — Ambulatory Visit (INDEPENDENT_AMBULATORY_CARE_PROVIDER_SITE_OTHER)
Admission: RE | Admit: 2010-11-02 | Discharge: 2010-11-02 | Disposition: A | Discharge status: Home / Self Care | Payer: Medicare Other | Source: Ambulatory Visit | Attending: Pulmonary Disease | Admitting: Pulmonary Disease

## 2010-11-02 ENCOUNTER — Other Ambulatory Visit (INDEPENDENT_AMBULATORY_CARE_PROVIDER_SITE_OTHER): Payer: Medicare Other

## 2010-11-02 ENCOUNTER — Encounter: Payer: Self-pay | Admitting: Pulmonary Disease

## 2010-11-02 ENCOUNTER — Ambulatory Visit (INDEPENDENT_AMBULATORY_CARE_PROVIDER_SITE_OTHER): Payer: Medicare Other | Admitting: Pulmonary Disease

## 2010-11-02 DIAGNOSIS — E7800 Pure hypercholesterolemia, unspecified: Secondary | ICD-10-CM

## 2010-11-02 DIAGNOSIS — R0600 Dyspnea, unspecified: Secondary | ICD-10-CM | POA: Insufficient documentation

## 2010-11-02 DIAGNOSIS — D649 Anemia, unspecified: Secondary | ICD-10-CM

## 2010-11-02 DIAGNOSIS — F411 Generalized anxiety disorder: Secondary | ICD-10-CM

## 2010-11-02 DIAGNOSIS — Z23 Encounter for immunization: Secondary | ICD-10-CM

## 2010-11-02 DIAGNOSIS — R0989 Other specified symptoms and signs involving the circulatory and respiratory systems: Secondary | ICD-10-CM

## 2010-11-02 DIAGNOSIS — I309 Acute pericarditis, unspecified: Secondary | ICD-10-CM

## 2010-11-02 DIAGNOSIS — I872 Venous insufficiency (chronic) (peripheral): Secondary | ICD-10-CM

## 2010-11-02 DIAGNOSIS — F419 Anxiety disorder, unspecified: Secondary | ICD-10-CM

## 2010-11-02 DIAGNOSIS — R609 Edema, unspecified: Secondary | ICD-10-CM

## 2010-11-02 DIAGNOSIS — I10 Essential (primary) hypertension: Secondary | ICD-10-CM

## 2010-11-02 DIAGNOSIS — K529 Noninfective gastroenteritis and colitis, unspecified: Secondary | ICD-10-CM

## 2010-11-02 LAB — BASIC METABOLIC PANEL
CO2: 33 mEq/L — ABNORMAL HIGH (ref 19–32)
Calcium: 9.1 mg/dL (ref 8.4–10.5)
Creatinine, Ser: 0.7 mg/dL (ref 0.4–1.2)
GFR: 91.24 mL/min (ref >60.00)

## 2010-11-02 LAB — HEPATIC FUNCTION PANEL
Bilirubin, Direct: 0 mg/dL (ref 0.0–0.3)
Total Bilirubin: 0.2 mg/dL — ABNORMAL LOW (ref 0.3–1.2)
Total Protein: 6.1 g/dL (ref 6.0–8.3)

## 2010-11-02 LAB — CBC WITH DIFFERENTIAL/PLATELET
Basophils Relative: 0.5 % (ref 0.0–3.0)
Eosinophils Absolute: 0.5 10*3/uL (ref 0.0–0.7)
HCT: 33.6 % — ABNORMAL LOW (ref 36.0–46.0)
Hemoglobin: 11 g/dL — ABNORMAL LOW (ref 12.0–15.0)
Lymphocytes Relative: 20.8 % (ref 12.0–46.0)
MCHC: 32.7 g/dL (ref 30.0–36.0)
Monocytes Relative: 9.4 % (ref 3.0–12.0)
Neutro Abs: 5.2 10*3/uL (ref 1.4–7.7)
RBC: 3.88 Mil/uL (ref 3.87–5.11)

## 2010-11-02 NOTE — Progress Notes (Signed)
Subjective:    Patient ID: Katie Galvan, female    DOB: 1927-12-05, 77 y.o.   MRN: [id number]  HPI 9 y/o WF, mother of Adrionna Delcid, who has moved here from [place] in 2011...  she has multiple medical problems including chronically elev right hemidiaph;  HBP;  Diastolic dysfunction & mild PulmHTN on 2DEcho;  VI & Edema;  Hx dyspagia & gastritis;  Divertics & ?colon polyp;  Hx UTI & renal failure from dehydration 3/12;  Severe DJD- s/p R.THR/ ?CPPD/ LBP/ Chr Pain Syndrome on MS Contin;  Osteopenia;  Anemia...  ~  January 17, 2010:  She has severe DJD (esp right hip), left wrist fx & surg, +LBP, etc> on MSContin per LMD in [place] that we have been trying to wean- now down from 90mg Bid to 60mg Bid (she had some withdrawal sweats as she weaned but now improved)... family notes she is "more with it"... she had f/u Ortho, DrBlackman, & given injection in knees & hip (helped some), started on MOBIC 15mg /d as well, he is considering right THR w/ ant approach... still f/u w/ DrKuzma for wrist, getting PT/ rehab weekly, improved... she will be moving to [place] independ [facility]... we discussed slowly decr MS further 60-30, 30-30, and f/u 44mo.  ~  March 24, 2010:  She was hosp 3/11 - 03/17/10 w/ UTI, dehydration & renal insuffic> cults were neg & PCT=0.22, but active sediment & given IV Rocephin in hosp;  BUN 118 =>15 & Creat 2.7 =>0.72 w/ hydration but BNP incr to 636;  Hg 10.0 =>9.1 & Fe=11 (7%sat), B12=478;  EGD by DrDBrodie showed HH & gastritis w/ neg HPylori, Rx w/ Prilosec & Carafate transiently;  Currently she feels better & looks great> right hip surg postponed & anxious to resched due to her severe pain (on MS 30+15 Bid & still in pain, wants to add back Mobic-OK);  We wanted to check labs today but they couldn't get blood ==> reviewed all her meds, add FeSO4/ VitC, add Mobic Prn, OK for surg (check labs in hosp preop)...    HBP>  On ASA & Diltiazem 240mg  bid;  BP= 122/66 today & she  denies CP, palpit, ch in SOB;  She does have incr edema since hydrated in hosp & BNP was up to 636 by disch (we discussed no salt, contin Lasix40mg /d);      Cardiac>  EKG essent WNL & 2DEcho showed mild LVH, norm sys funct w/ EF=55-65%, gr1 DD, mild MR w/ calcif annulus, Ao sclerosis w/ triv AI, mod PulmHTN w/ PAsys=46.  ~  June 30, 2010:  44mo ROV & post hosp visit>  She was hosp x2 since I saw her last >>    Hosp 4/6 - 04/18/10 by DrBlackman for right THR & required a revision of the acetabular component 4d after the initial surg...     Hosp 6/2 - 06/13/10 w/ hypoxemic acute resp failure precipitated by fluid overload & diastolic CHF (BNP=2000) in assoc w/ her chr elev right hemidiaph, RLL atx/ scarring, & scoliosis w/ multilevel degen disc dis(w/ resultant restrictive lung disease); she was also Anemic (Hg= 9-10) w/ low Fe (=10, 6%sat); and also remains on large dose narcotic analgesics for her chr pain syndrome>  Disch back to [facility] on meds as reconciled below...     She is currently getting PT & walking w/ a walker, going about 200 ft w/ her nasal O2 at 2L/min w/ some DOE but she has been very sedentary for a long  time;  She denies CP, palpit, syncope, but has some mild edema on Lasix 40mg /d; We rechecked her O2 sat= 83% RA at rest & she will need to stay on the O2 for now;  Her narcotic pain med hasn't been weaned any further since her hip surg in April> on MSContin 30+15 Bid + Percocet 5mg  prn...    LABS today>  CXR stable (elev right hemidiaph, atx, NAD);  Chems normal (Creat 0.8, BNP 95);  CBC w/ anemia (Hg 9.1, MCV 77, Fe 15), she has B pos blood> NOTE she had EGD 3/12 showing HH, gastritis, neg HPylori...    We decided to keep Lasix 40mg /d, restrict sodium etc;  Increase PPI to Bid, incr Fe w/ VitC Bid, check stool for occult blood, consider IV Fe if not improving orally & consider further GI eval...  ~  July 21, 2010:  3wk ROV & last visit we decided to keep LASIX 40mg /d; try to cut the  MSContin from 45mg Bid to 45mg AM & 30mg PM, incr the Fe to Bid (w/ VitC), and incr the Prilosec 20mg Bid...  She reports stable & is now back home w/ family (out of skilled care & not yet ready to be on her own in [place])> they have weaned the MSContin down to 30mg AM & 30mg PM & doing satis (no Oxycodone needed);  Labs shows improved Hg= 10.0 now;  O2 sats improved> 94% RA at rest & decr to 93% on RA walking in the room;  Her edema is gone & BUN=25, Creat=0.8;  Getting home PT 3d per week;  Also BS improved to the 120-130 range...    We decided to continue current meds but decr the Diltiazem to 240mg  once daily (due to BP sl low at 102/54); and they will continue to slowly wean the MSContin...  ~  August 24, 2010:  28mo ROV & she has list of questions to discuss: 1) asked about driving & I advised against it & felt she should give up her license, if it is a point of family contention then I suggested Driver's preparedness testing from [facility];  2)has some right hip pain & saw her Ortho- DrBlackman, given shot & Aleve;  3) wonders about Oxygen> using it Qhs & we will check ono, ambulated in office on RA> 92% RA at rest & 88% after 2 laps w/ walker & pulse 100...     They have decr her MSContin to 30mg  Bid and requiring zero Oxycodone in betw; DrBlackman added Aleve for her hip pain; Her BP is fine on the Diltiazem 240mg /d; she saw DrMohammed several weeks ago for a post hosp check of her anemia- labs reviewed in EPIC- "he released me"...  ~  November 02, 2010:  81mo ROV & post hosp visit> she was hosp 10/7 - 10/19/10 w/ abd pain/ N/ V/ D & found to have ?colitis ?presumed infectious & resolved w/ Cipro/ Flagyl; also had CP & marked incr enzymes w/ +MB & Troponin=21; EKG reported to show diffuse STseg elev (no tracings in EChart to review); no rub on exam, felt to have myopericarditis; 2DEcho showed LV cavity dilated w/ septal apical anterior HK, & incr wall thickness c/w modLVH & mild conc hypertrophy; mildMR,  normal RV;  She also developed fluid overload (off her diuretic & receiving IVF w/ the colitis) w/ BNP up to 10,000 & then diuresed w/ improvement; her prev meds were changed> Diltiazem & Vasotec stopped, given Lisinopril 2.5mg , & Lasix decr to 20mg /d...    Since disch  she has retained fluid w/ 3+edema in LEs & gained>5lbs on the Lasix20 in the NH;  BP= 116/68;  We rechecked studies today:  CXR shows marked elev right hemidiaph & appears similar to her baseline film 6/12;  BNP=483;  Chems= normal;  Hg=11.0, WBC=8.2, TSH=5.77 but was norm prev (?euthyroid "sick" syndrome);  We discussed incr LASIX to 40mg /d, elevate legs, ?try support hose, & f/u in 4-6 weeks... She has appt w/ Cards tomorrow.   HOSPITALIZATIONS: ~  [facility] 11/3 - 11/08/09 after fall at home w/ comminuted left wrist fx & had surg by DrKuzma ~  [facility] 3/11 - 03/17/10 w/ UTI, dehydration & renal insuffic> back to norm w/ hydration; also anemic, Fe defic & GI eval DrDBrodie w/ HH/ gastritis ~  [facility] - 04/18/10 by DrBlackman for right THR & required a revision of the acetabular component 4d after the initial surg... ~  [facility] 6/2 - 06/13/10 w/ hypoxemic acute resp failure precipitated by fluid overload & diastolic CHF (BNP=2000) in assoc w/ her chr elev right hemidiaph, RLL atx/ scarring, & scoliosis w/ multilevel degen disc dis(w/ resultant restrictive lung disease)... ~  [facility] 10/7 - 10/19/10 w/ abd pain/ N/ V/ D & found to have ?colitis ?presumed infectious & resolved w/ Cipro/ Flagyl; also had CP & abn EKG/ Enz felt to be a myopericarditis...          Problem List:    ACUTE ON CHRONIC RESP INSUFFICIENCY>  Precipitated 6/12 by diastolic CHF superimposed on her chronic resp problems etc... Now on ADVAIR100 Bid, SPIRIVA daily, O2 2L/ min... DIAPHRAGMATIC DISORDER (ICD-519.4) - she has a chronically elev right hemidiaph, apparently idiopathic; and she is mostly asymptomatic w/o cough, phlegm, hemoptysis, ch in SOB, etc... ~  11/11:  CXR showed  calcif Ao, elev right hemidiaph, mild scarring at bases, NAD... ~  6/12:  Presented to ER w/ hypoxemic resp failure due to vol overload assoc w/ her restrictive lung dis & chr pain syndrome requiring narcotic analgesics... ~  7/12:  O2 sats improved & OK to cut back the Oxygen to prn... ~  10/12:  Hosp w/ ?colitis and ?myopericarditis> developed fluid retention, CHF, abn EKG/ Enz & 2DEcho> seen by [facility]...  Hx HYPERTENSION (ICD-401.9) VALVULAR HEART DISEASE (ICD-424.90) > prev trial AI, mild MR... Hx of CARDIAC ARRHYTHMIA (ICD-427.9)  ? EPISODE MYOPERICARDITIS > see 10/12 Hosp for colitis... ~  she was on ASA81, Diltiazem240, Vasotec20, & LASIX 40mg /d; but meds changed during the 10/12 Hosp to ASA 81mg /d, LISINOPRIL 2.5mg /d, LASIX 20mg /d... BP today = 116/68 & she tolerates the meds well & denies HA, visual changes, CP, palipit, syncope, ch in dyspnea, etc... she has been told about some type of valvular heart disease (from doctors in [place]) but she doesn't know the details & not sure when her last 2DEcho was done... she has never been on blood thinners etc... ~  11/11 & 6/12:  EKG showed NSR, NSSTTWA, NAD... ~  2DEcho 6/12 showed mild LVH, norm LVF w/ EF=55-60%, Gr 1 DD, trivial AI, heavily calcif mitral annulus & mild MR, PAsys est . ~  7/12:  BP on the low side at 102/54 & we decided to decr the DILTIAZEM to 240mg /d ==> stable on the lower dose. ~  10/12:  SEE ABOVE + EChart records... She has f/u w/ [facility].  VENOUS INSUFFICIENCY (ICD-459.81) LEG EDEMA, CHRONIC (ICD-782.3) - she has severe venous stasis changes and chronic edema, thickened skin over LE's etc... she knows to be careful  w/ hygiene, no salt, elevation, support hose, etc ~  8/12:  Stable on Lasix40, 1+chr edema, wt=137# ~  10/12:  Post hosp check on Lasix20, 3+edema, BNP=483, wt=152#; rec to incr Lasix to 40mg /d...  HYPERCHOLESTEROLEMIA (ICD-272.0) - she has been on MEVACOR 20mg /d from her [place] physician... we  do not have Lipid Profile results to review & we have sent for old records... ~  Perhaps we can try to get an FLP at [facility]... ~  FLP during the 10/12 hosp showed TChol 146, TG 98, HDL 53, LDL 73  OTHER DYSPHAGIA (ICD-787.29) - she is on PRILOSEC 20mg /d & reports occas dysphagia but denies choking etc... she apparently has never had an Endoscopy or GI eval for this problem, "I have to be careful when I eat"... ~  6/12:  NOTE she had EGD 3/12 showing HH, gastritis, neg HPylori> rec increase PPI to Bid. ~  10/12:  She was discharge off her PPI therapy...  DIVERTICULOSIS OF COLON (ICD-562.10) Hx of COLONIC POLYPS (ICD-211.3) - she tells me that prev colonoscopy in [place] showed divertics & ?polyp but she does not recall any details...  she denies much constipation despite the narcotic analgesics- uses prunes as needed.  DEGENERATIVE JOINT DISEASE (ICD-715.90) - she has severe osteoarthritis w/ XRays 11/11 showing severe osteopenia, severe right hip degeneration & relative sparing of the left hip joint;  DJD knees w/ ?CPPD, abn patella;  left wrist fx- radial  head & ulnar styloid... she had surg left wrist by DrKKuzma... ~  4/12:  DrBlackman did right THR but required revision of acetabular component 4d later> went to [facility] for rehab... ~  DrBlackman continues to follow w/ shots as needed for bursitis she says... ~  10/12:  She is c/o right shoulder pain & decr ROM, she wonders if this might be the cause of her chest discomfort; she will f/u w/ Ortho for XRays & shot...  Hx of BACK PAIN, LUMBAR (ICD-724.2) CHRONIC PAIN SYNDROME (ICD-338.4) - she has chronic pain- mostly from her arthritic condition- and was started on MSContin by her LMD in [place] in the summer of 2011> dose slowly increased & she was on 90mg  Bid (taking a 60mg  tab + 30mg  tab Bid)... she does not believe that this med has anything to do w/ her fall & wrist fx, or her complaints of decr memory etc... we discussed the need  to wean off this medication & try to deal w/ her arthritis pain thru an Orthopedist or poss a Pain Clinic... ~  11/11:  she notes pain worse during the day while up & about> try to decr MSContin to 90mg AM & 60mg PM... ~  1/12 & 4/12:  she is down to 60mg Bid & we discussed weaning further ==> she was down to 45mg  (30+15) Bid by the time of her right THR... ~  6/12:  Recommend that she try to wean further> try 45am & 30pm... ~  7/12:  She's down to 30mg  Bid & will wean further as able... ~  10/12:  She remains on the MSContin at 30mg Bid...  OSTEOPENIA (ICD-733.90) - XRays here showed severe osteopenia... she notes that she used to be on Fosamax but ?how long? & when stopped?... she states that she was told her prev BMD was "good"... she has not been taking calcium supplements, but was prev on some OTC Vit D supplements... asked to resume Calcium, Women's MVI, Vit D 1000u daily...  Hx of DEPRESSION (ICD-311) - on CELEXA 20mg /d and she wants  to continue this med...  ANEMIA, MILD (ICD-285.9) - Hg post op 11/11 wrist fx surg = 11.2 ~  6/12:  Labs showed Hg= 9.1, MCV= 77, Fe= 15 (7%sat), & she has B pos blood> we decided to incr Fe to Bid (may need IV Fe if not responding). ~  7/12:  Labs showed Hg= 10.0, MCV= 76, she reports stool checks at [facility] were NEG... ~  8/12: she reports that she had f/u Heme eval DrMohammed & "he released me" on Fe + VitC daily... ~  10/12:  Labs post hosp showed Hg= 11.0, MCV= 87   Past Surgical History  Procedure Date  . Appendectomy   . Breast surgery   . Right wrist surgery   . Bilateral cataracts   . Hip surgery   . Tonsillectomy   . Adenoidectomy     Outpatient Encounter Prescriptions as of 11/02/2010  Medication Sig Dispense Refill  . aspirin 81 MG EC tablet Take 81 mg by mouth daily.        . Calcium Carbonate-Vit D-Min (CALTRATE 600+D PLUS) 600-400 MG-UNIT per tablet Chew 1 tablet by mouth 2 (two) times daily.       . Cholecalciferol (VITAMIN D-1000  MAX ST) 1000 UNITS tablet Take 2,000 Units by mouth daily.       . citalopram (CELEXA) 20 MG tablet Take 1 tablet (20 mg total) by mouth daily.  30 tablet  11  . ferrous sulfate 325 (65 FE) MG tablet Take 325 mg by mouth 2 (two) times daily.        . Fluticasone-Salmeterol (ADVAIR DISKUS) 100-50 MCG/DOSE AEPB Inhale 1 puff into the lungs every 12 (twelve) hours.        Marland Kitchen lisinopril (PRINIVIL,ZESTRIL) 2.5 MG tablet Take 2.5 mg by mouth daily.        Marland Kitchen loratadine (CLARITIN) 10 MG tablet Take 10 mg by mouth daily.        Marland Kitchen lovastatin (MEVACOR) 20 MG tablet Take 1 tablet (20 mg total) by mouth at bedtime.  30 tablet  11  . morphine (MS CONTIN) 30 MG 12 hr tablet Take 30 mg by mouth 2 (two) times daily. Take as directed      . Multiple Vitamin (MULTIVITAMIN PO) Take 1 tablet by mouth daily.        Marland Kitchen tiotropium (SPIRIVA) 18 MCG inhalation capsule Place 1 capsule (18 mcg total) into inhaler and inhale daily.  30 capsule  11  . vitamin C (ASCORBIC ACID) 500 MG tablet Take 500 mg by mouth daily.        . furosemide (LASIX) 40 MG tablet Take 40 mg by mouth daily.        . polyethylene glycol (MIRALAX / GLYCOLAX) packet Take 17 g by mouth daily.        Marland Kitchen DISCONTD: diltiazem (CARDIZEM CD) 240 MG 24 hr capsule Take 1 capsule (240 mg total) by mouth daily. 1 tablet by mouth once daily  30 capsule  11  . DISCONTD: LORazepam (ATIVAN) 0.5 MG tablet Take 0.5 mg by mouth every 8 (eight) hours. As needed for anxiety       . DISCONTD: morphine (MS CONTIN) 15 MG 12 hr tablet Take as directed  120 tablet  0    Allergies  Allergen Reactions  . Sulfonamide Derivatives Swelling    Current Medications, Allergies, Past Medical History, Past Surgical History, Family History, and Social History were reviewed in [employer] record.    Review of Systems  Constitutional:  Denies F/C/S, anorexia, unexpected weight change. HEENT:  No HA, visual changes, earache, nasal symptoms, sore throat,  hoarseness. Resp:  No cough, sputum, hemoptysis; mild SOB/ DOE noted... Cardio:  No CP, palpit, orthopnea;  +edema & DOE but limited mobility. GI:  Denies N/V/D, tends toward constip due to narcotics; swallowing OK & denies reflux or abd pain.  GU:  No dysuria, freq, urgency, hematuria, or flank pain. MS:  Severe DJD esp right hip w/ pain & decr ROM ==> s/p right THR now... Neuro:  No tremors, seizures, dizziness, syncope; +weakness & multifactorial gait abn. Skin:  No suspicious lesions or skin rash. Heme:  No adenopathy, bruising, bleeding. Psyche: Denies confusion, sleep disturbance, hallucinations; +anxiety & situational depression.    Objective:   Physical Exam   WD, WN, 77 y/o WF chr ill appearing but in NAD... Vital Signs:  Reviewed... General:  Alert & oriented; pleasant & cooperative... HEENT:  /AT, EOM-wnl, PERRLA, EACs-clear, TMs-wnl, NOSE-clear, THROAT-clear & wnl. Neck:  Supple w/ decr ROM; no JVD; normal carotid impulses w/o bruits; no thyromegaly or nodules palpated; no lymphadenopathy. Chest:  Clear to P & A; without wheezes/ rales/ or rhonchi heard... Heart:  Regular Rhythm; norm S1 & S2 without murmurs/ rubs/ or gallops detected... Abdomen:  Soft & nontender; normal bowel sounds; no organomegaly or masses palpated... Ext:  decrROM; +deformities & mod arthritic changes; no varicose veins, +venous insuffic & 3+edema;  Pulses intact w/o bruits... s/p right THR w/ revision; ambulates w/ walker... Neuro:  CNs intact;  No focal neuro deficits, in wheelchair & painful standing & walking... Derm:  No lesions noted; no rash etc... Lymph:  No cervical, supraclavicular, axillary, or inguinal adenopathy palpated...   RADIOLOGY DATA:  Reviewed in the EPIC EMR & discussed w/ the patient...  LABORATORY DATA:  Reviewed in the EPIC EMR & discussed w/ the patient...   Assessment & Plan:   Recent Colitis- presumed infectious>  Resolved w/ Cipro/ Flagyl; off meds and back to  baseline GI status she feels...  ?Myopericarditis w/ abn EKG/ Enz & subseq vol overload w/ abn 2DEcho>  She improved back on her diuretic & meds were adjusted this hosp; we will incr LASIX back to 40mg /d today & recheck pt in 4-6 weeks...   Acute on Chr Resp Failure>  Multifactorial w/ diastolic CHF, elev right hemidiaph, atx, scoliosis, restrictive lung dis, narcotic pain meds- all playing a role... O2 sats are now improved & she can decr the oxygen to as needed at rest & 2L/min w/ exercise...  Elev right hemidiaph>  This is chronic & assoc w/ some mild basilar atx; encouraged to get deep breaths & expand well...  HBP, etc>  BP is actually stable on less medication since her 10/12 hosp...  Diastolic CHF>  BNP is much improved; incr Lasix back to 40mg /d, low sodium, etc...  VI, Edema>  Discussed Lasix40, no salt, elevate legs, TED hose, etc...  CHOL>  Continue Mevacor w/ FLP looking good in the hosp...  GERD, Dysphagia>  She had EGD during prev hosp w/ HH, gastritis & neg HPylori;  Prev on PPI Rx but off now & she feels she is OK, we will follow.  Divertics, Constip>  Related to her narcotic analgesics; improved w/ laxatives OTC...  DJD, s/p right THR, Chr Pain Syndrome> Trying to wean MSContin further which should be poss based on the fact that the painful right hip has been replaced...  Other medical problems as noted.Marland KitchenMarland Kitchen

## 2010-11-02 NOTE — Patient Instructions (Addendum)
Today we updated your med list in our EPIC system...    We reconciled your meds w/ the [facility] med list...    We decided to increase your LASIX to 40mg /d...  Today we did your follow up CXR & blood work...    We will call you w/ the results when avail...     Let's plan a follow up visit in 4-6 weeks.Marland KitchenMarland Kitchen

## 2010-11-03 ENCOUNTER — Ambulatory Visit (HOSPITAL_COMMUNITY)
Admission: RE | Admit: 2010-11-03 | Discharge: 2010-11-03 | Disposition: A | Discharge status: Home / Self Care | Payer: Medicare Other | Source: Ambulatory Visit | Attending: Internal Medicine | Admitting: Internal Medicine

## 2010-11-03 VITALS — BP 116/74 | HR 65 | Wt 147.2 lb

## 2010-11-03 DIAGNOSIS — I5042 Chronic combined systolic (congestive) and diastolic (congestive) heart failure: Secondary | ICD-10-CM | POA: Insufficient documentation

## 2010-11-03 NOTE — Assessment & Plan Note (Addendum)
Volume status elevated.  NYHA III.  Will increase lasix 40 mg BID x3 days then back to 40 mg daily.  Add KCL 40 mEq daily.  Will also add coreg 3.125 mg BID, will titrate as tolerated next visit.  Add compression hose, which she already has at home.  Check labs next time.  Discussed continuing daily weights and low sodium diet.    Patient seen and examined with Ulyess Blossom PA-C. We discussed all aspects of the encounter. I agree with the assessment and plan as stated above.

## 2010-11-03 NOTE — Patient Instructions (Signed)
Per facility slip.

## 2010-11-03 NOTE — Progress Notes (Signed)
HPI:  Katie Galvan is an 77 yo with history of mixed diastolic and systolic HF, LVEF 30%, restrictive lung disease, right hemidiaphragm elevation, COPD, GERD, history of endocarditis recently admitted for colitis requiring cipro/flagyl.  She also had presumed myocarditis treated with colchicine.  Echo 10/10/10:  Hyperdynamic basal function Septal apical anterior hypokinesis The cavity size was severely dilated.  Wall thickness was increased in a pattern of moderate LVH.  There was mild concentric hypertrophy. The estimated ejection fraction was 30%.  Diuresed well and discharged on lasix 20 mg daily.    She is here today for evaluation.  She has been retaining fluid since discharge on 10/17.  Her breathing is poor at baseline and wears continuous O2.  She is currently in rehab and has been able to complete a 6 min walk without stopping until yesterday.  She was evaluated by [name] who increased her lasix to 40 mg daily.  She denies orthopnea/PND.  Wears 02 at night.  Weight is up ~5-6 lbs over the last several days.  No orthopnea or PND.  No dizziness or syncope.      ROS: All other systems normal except as mentioned in HPI, past medical history and problem list.    Past Medical History  Diagnosis Date  . Disorders of diaphragm   . Unspecified essential hypertension   . Endocarditis, valve unspecified, unspecified cause   . Cardiac dysrhythmia, unspecified   . Unspecified venous (peripheral) insufficiency   . Edema   . Pure hypercholesterolemia   . Other dysphagia   . Diverticulosis of colon (without mention of hemorrhage)   . Benign neoplasm of colon   . Osteoarthrosis, unspecified whether generalized or localized, unspecified site   . Lumbago   . Chronic pain syndrome   . Disorder of bone and cartilage, unspecified   . Depressive disorder, not elsewhere classified   . Anemia, unspecified   . Skin cancer   . Systolic heart failure     echo 10/10/10 EF 30%    Current Outpatient  Prescriptions  Medication Sig Dispense Refill  . aspirin 81 MG EC tablet Take 81 mg by mouth daily.        . Calcium Carbonate-Vit D-Min (CALTRATE 600+D PLUS) 600-400 MG-UNIT per tablet Chew 1 tablet by mouth 2 (two) times daily.       . Cholecalciferol (VITAMIN D-1000 MAX ST) 1000 UNITS tablet Take 2,000 Units by mouth daily.       . citalopram (CELEXA) 20 MG tablet Take 1 tablet (20 mg total) by mouth daily.  30 tablet  11  . ferrous sulfate 325 (65 FE) MG tablet Take 325 mg by mouth 2 (two) times daily.        . Fluticasone-Salmeterol (ADVAIR DISKUS) 100-50 MCG/DOSE AEPB Inhale 1 puff into the lungs every 12 (twelve) hours.        . furosemide (LASIX) 40 MG tablet Take 40 mg by mouth daily.        Marland Kitchen lisinopril (PRINIVIL,ZESTRIL) 2.5 MG tablet Take 2.5 mg by mouth daily.        Marland Kitchen loratadine (CLARITIN) 10 MG tablet Take 10 mg by mouth daily.        Marland Kitchen lovastatin (MEVACOR) 20 MG tablet Take 1 tablet (20 mg total) by mouth at bedtime.  30 tablet  11  . morphine (MS CONTIN) 30 MG 12 hr tablet Take 30 mg by mouth 2 (two) times daily. Take as directed      . Multiple Vitamin (  MULTIVITAMIN PO) Take 1 tablet by mouth daily.        . polyethylene glycol (MIRALAX / GLYCOLAX) packet Take 17 g by mouth daily.        Marland Kitchen tiotropium (SPIRIVA) 18 MCG inhalation capsule Place 1 capsule (18 mcg total) into inhaler and inhale daily.  30 capsule  11  . vitamin C (ASCORBIC ACID) 500 MG tablet Take 500 mg by mouth daily.           Allergies  Allergen Reactions  . Sulfonamide Derivatives Swelling    History   Social History  . Marital Status: Single    Spouse Name: N/A    Number of Children: 2  . Years of Education: N/A   Occupational History  . Retired   .     Social History Main Topics  . Smoking status: Never Smoker   . Smokeless tobacco: Never Used  . Alcohol Use: No  . Drug Use: No  . Sexually Active: Not on file   Other Topics Concern  . Not on file   Social History Narrative  . No  narrative on file    No family history on file.  PHYSICAL EXAM: Filed Vitals:   11/03/10 1317  BP: 116/74  Pulse: 65  Wt 147  General:  Well appearing. No respiratory difficulty HEENT: normal Neck: supple. JVP to jawline. Carotids 2+ bilat; no bruits. No lymphadenopathy or thryomegaly appreciated. Cor: PMI nondisplaced. Regular rate & rhythm. No rubs, gallops or murmurs. Lungs: decreased BS on RLL, otherwise no crackles/wheezes  Abdomen: soft, nontender, nondistended. No hepatosplenomegaly. No bruits or masses. Good bowel sounds. Extremities: no cyanosis, clubbing, rash, 3 + LE bilateral edema to knee  Neuro: alert & oriented x 3, cranial nerves grossly intact. moves all 4 extremities w/o difficulty. Affect pleasant.   Results for orders placed in visit on 11/02/10 (from the past 24 hour(s))  BASIC METABOLIC PANEL     Status: Abnormal   Collection Time   11/02/10  5:17 PM      Component Value Range   Sodium 138  135 - 145 (mEq/L)   Potassium 3.9  3.5 - 5.1 (mEq/L)   Chloride 101  96 - 112 (mEq/L)   CO2 33 (*) 19 - 32 (mEq/L)   Glucose, Bld 105 (*) 70 - 99 (mg/dL)   BUN 12  6 - 23 (mg/dL)   Creatinine, Ser 0.7  0.4 - 1.2 (mg/dL)   Calcium 9.1  8.4 - 16.1 (mg/dL)   GFR 09.60  >45.40 (mL/min)  HEPATIC FUNCTION PANEL     Status: Abnormal   Collection Time   11/02/10  5:17 PM      Component Value Range   Total Bilirubin 0.2 (*) 0.3 - 1.2 (mg/dL)   Bilirubin, Direct 0.0  0.0 - 0.3 (mg/dL)   Alkaline Phosphatase 53  39 - 117 (U/L)   AST 18  0 - 37 (U/L)   ALT 9  0 - 35 (U/L)   Total Protein 6.1  6.0 - 8.3 (g/dL)   Albumin 3.4 (*) 3.5 - 5.2 (g/dL)  CBC WITH DIFFERENTIAL     Status: Abnormal   Collection Time   11/02/10  5:17 PM      Component Value Range   WBC 8.2  4.5 - 10.5 (K/uL)   RBC 3.88  3.87 - 5.11 (Mil/uL)   Hemoglobin 11.0 (*) 12.0 - 15.0 (g/dL)   HCT 98.1 (*) 19.1 - 46.0 (%)   MCV 86.7  78.0 -  100.0 (fl)   MCHC 32.7  30.0 - 36.0 (g/dL)   RDW 27.2 (*) 53.6 -  14.6 (%)   Platelets 312.0  150.0 - 400.0 (K/uL)   Neutrophils Relative 63.7  43.0 - 77.0 (%)   Lymphocytes Relative 20.8  12.0 - 46.0 (%)   Monocytes Relative 9.4  3.0 - 12.0 (%)   Eosinophils Relative 5.6 (*) 0.0 - 5.0 (%)   Basophils Relative 0.5  0.0 - 3.0 (%)   Neutro Abs 5.2  1.4 - 7.7 (K/uL)   Lymphs Abs 1.7  0.7 - 4.0 (K/uL)   Monocytes Absolute 0.8  0.1 - 1.0 (K/uL)   Eosinophils Absolute 0.5  0.0 - 0.7 (K/uL)   Basophils Absolute 0.0  0.0 - 0.1 (K/uL)  TSH     Status: Abnormal   Collection Time   11/02/10  5:17 PM      Component Value Range   TSH 5.77 (*) 0.35 - 5.50 (uIU/mL)  BRAIN NATRIURETIC PEPTIDE     Status: Abnormal   Collection Time   11/02/10  5:17 PM      Component Value Range   BNP, POC 483.0 (*) 0.0 - 100.0 (pg/mL)   Dg Chest 1 View  11/02/2010  *RADIOLOGY REPORT*  Clinical Data: Hypertension  CHEST - 1 VIEW  Comparison: 10/09/2010  Findings: Marked elevation of the right hemidiaphragm compatible with  chronic phrenic  nerve paralysis.  This is unchanged.  Right lower lobe atelectasis is unchanged.    No acute infiltrate or edema.  Left lung is clear  IMPRESSION: Chronic elevation of the right hemidiaphragm with right lower lobe atelectasis.    No acute cardiopulmonary disease.  Original Report Authenticated By: Camelia Phenes, M.D.     ASSESSMENT & PLAN:

## 2010-11-08 NOTE — Progress Notes (Signed)
Patient seen and examined with Nicki Bradley PA-C. We discussed all aspects of the encounter. I agree with the assessment and plan as stated above.   

## 2010-11-09 ENCOUNTER — Ambulatory Visit (HOSPITAL_COMMUNITY)
Admission: RE | Admit: 2010-11-09 | Discharge: 2010-11-09 | Disposition: A | Discharge status: Home / Self Care | Payer: Medicare Other | Source: Ambulatory Visit | Attending: Internal Medicine | Admitting: Internal Medicine

## 2010-11-09 ENCOUNTER — Encounter (HOSPITAL_COMMUNITY): Payer: Self-pay

## 2010-11-09 DIAGNOSIS — I5042 Chronic combined systolic (congestive) and diastolic (congestive) heart failure: Secondary | ICD-10-CM | POA: Insufficient documentation

## 2010-11-09 MED ORDER — SPIRONOLACTONE 25 MG PO TABS: ORAL_TABLET | ORAL | Status: DC

## 2010-11-09 NOTE — Progress Notes (Signed)
HPI:  Katie Galvan is an 77 yo with history of mixed diastolic and systolic HF, LVEF 30%, restrictive lung disease, right hemidiaphragm elevation, COPD, GERD, history of endocarditis recently admitted for colitis requiring cipro/flagyl.  She also had presumed myocarditis treated with colchicine.  Echo 10/10/10:  Hyperdynamic basal function Septal apical anterior hypokinesis The cavity size was severely dilated.  Wall thickness was increased in a pattern of moderate LVH.  There was mild concentric hypertrophy. The estimated ejection fraction was 30%.  Diuresed well and discharged on lasix 20 mg daily.    She is here for follow up. Complains of fatigue.  At last office visit Lasix increased to 40 mg bid x 3 days then decrease 40 mg daily, and Coreg 3.125 mg bid was initiated.She did have a 3 pound weight loss however her weight is trending up. Weight at SNF 146-150.  Lower extremity edema decreased. She is wearing ted hose. Dyspnea on exertion all the time.Continuous 2L oxygen.  Denies dyspnea at rest. Denies cough. Able to walk 3 1/2 min before getting SOB. Lives in skilled nursing facility. PT following at SNF. Sleeps with HOB elevated.   ROS: All other systems normal except as mentioned in HPI, past medical history and problem list.    Past Medical History  Diagnosis Date  . Disorders of diaphragm   . Unspecified essential hypertension   . Endocarditis, valve unspecified, unspecified cause   . Cardiac dysrhythmia, unspecified   . Unspecified venous (peripheral) insufficiency   . Edema   . Pure hypercholesterolemia   . Other dysphagia   . Diverticulosis of colon (without mention of hemorrhage)   . Benign neoplasm of colon   . Osteoarthrosis, unspecified whether generalized or localized, unspecified site   . Lumbago   . Chronic pain syndrome   . Disorder of bone and cartilage, unspecified   . Depressive disorder, not elsewhere classified   . Anemia, unspecified   . Skin cancer   .  Systolic heart failure     echo 10/10/10 EF 30%    Current Outpatient Prescriptions  Medication Sig Dispense Refill  . aspirin 81 MG EC tablet Take 81 mg by mouth daily.        . Calcium Carbonate-Vit D-Min (CALTRATE 600+D PLUS) 600-400 MG-UNIT per tablet Chew 1 tablet by mouth 2 (two) times daily.       . Cholecalciferol (VITAMIN D-1000 MAX ST) 1000 UNITS tablet Take 2,000 Units by mouth daily.       . citalopram (CELEXA) 20 MG tablet Take 1 tablet (20 mg total) by mouth daily.  30 tablet  11  . ferrous sulfate 325 (65 FE) MG tablet Take 325 mg by mouth 2 (two) times daily.        . Fluticasone-Salmeterol (ADVAIR DISKUS) 100-50 MCG/DOSE AEPB Inhale 1 puff into the lungs every 12 (twelve) hours.        . furosemide (LASIX) 40 MG tablet Take 20 mg by mouth daily.       Marland Kitchen lisinopril (PRINIVIL,ZESTRIL) 2.5 MG tablet Take 2.5 mg by mouth daily.        Marland Kitchen loratadine (CLARITIN) 10 MG tablet Take 10 mg by mouth daily.        Marland Kitchen lovastatin (MEVACOR) 20 MG tablet Take 1 tablet (20 mg total) by mouth at bedtime.  30 tablet  11  . morphine (MS CONTIN) 30 MG 12 hr tablet Take 30 mg by mouth 2 (two) times daily. Take as directed      .  Multiple Vitamin (MULTIVITAMIN PO) Take 1 tablet by mouth daily.        Marland Kitchen oxyCODONE (OXY IR/ROXICODONE) 5 MG immediate release tablet Take 5 mg by mouth every 4 (four) hours as needed.        . polyethylene glycol (MIRALAX / GLYCOLAX) packet Take 17 g by mouth daily.        Marland Kitchen tiotropium (SPIRIVA) 18 MCG inhalation capsule Place 1 capsule (18 mcg total) into inhaler and inhale daily.  30 capsule  11  . vitamin C (ASCORBIC ACID) 500 MG tablet Take 500 mg by mouth daily.        Marland Kitchen zolpidem (AMBIEN) 10 MG tablet Take 10 mg by mouth at bedtime as needed.           Allergies  Allergen Reactions  . Sulfonamide Derivatives Swelling    History   Social History  . Marital Status: Single    Spouse Name: N/A    Number of Children: 2  . Years of Education: N/A   Occupational  History  . Retired   .     Social History Main Topics  . Smoking status: Never Smoker   . Smokeless tobacco: Never Used  . Alcohol Use: No  . Drug Use: No  . Sexually Active: Not on file   Other Topics Concern  . Not on file   Social History Narrative  . No narrative on file    Family History  Problem Relation Age of Onset  . Diabetes Mother   . Heart disease Father   . Lung cancer Sister     PHYSICAL EXAM: Filed Vitals:   11/09/10 0943  BP: 118/50  Pulse: 63  Wt: 147  (147)  General:  Well appearing. No respiratory difficulty HEENT: normal Neck: supple. JVP 8-9. Carotids 2+ bilat; no bruits. No lymphadenopathy or thryomegaly appreciated. Cor: PMI nondisplaced. Regular rate & rhythm. No rubs, gallops or murmurs. Lungs: decreased BS on RLL, otherwise no crackles/wheezes  Abdomen: soft, nontender, nondistended. No hepatosplenomegaly. No bruits or masses. Good bowel sounds. Extremities: no cyanosis, clubbing, rash, 2+ LE bilateral edema   Neuro: alert & oriented x 3, cranial nerves grossly intact. moves all 4 extremities w/o difficulty. Affect pleasant.   No results found for this or any previous visit (from the past 24 hour(s)). No results found.   ASSESSMENT & PLAN: x

## 2010-11-09 NOTE — Assessment & Plan Note (Addendum)
NYHA III. Volume status improved but remains elevated. Tolerating addition of Carvedilol. Although weight is trending up this may be due to Carvedilol. Will continue Lasix 40 mg daily and add Spironolactone 12.5 mg daily. Stop Potassium. Reviewed previous BMET 10/31 creat 0.7 Potassium 3.9. BMET Monday 11/14/10 at SNF.  Continue daily compression stockings. SNF to continue to weigh and record daily. SNF to fax weights every Wednesday. Follow up in two weeks for medication titration.

## 2010-11-09 NOTE — Patient Instructions (Signed)
Stop Potassium  Take Spironolactone 12.5 mg daily  Please weigh and record daily. [facility]ax [facility] to [facility] [phone]  Please check BMET next week (Monday) and fax results to [facility] [phone]  Follow up in two weeks.

## 2010-11-14 ENCOUNTER — Encounter: Payer: Self-pay | Admitting: Internal Medicine

## 2010-11-21 ENCOUNTER — Ambulatory Visit (HOSPITAL_COMMUNITY)
Admission: RE | Admit: 2010-11-21 | Discharge: 2010-11-21 | Disposition: A | Discharge status: Home / Self Care | Payer: Medicare Other | Source: Ambulatory Visit | Attending: Internal Medicine | Admitting: Internal Medicine

## 2010-11-21 VITALS — BP 156/69 | HR 65 | Wt 143.0 lb

## 2010-11-21 DIAGNOSIS — I5042 Chronic combined systolic (congestive) and diastolic (congestive) heart failure: Secondary | ICD-10-CM

## 2010-11-21 DIAGNOSIS — I509 Heart failure, unspecified: Secondary | ICD-10-CM

## 2010-11-21 MED ORDER — CARVEDILOL 6.25 MG PO TABS: 6.2500 mg | ORAL_TABLET | Freq: Two times a day (BID) | ORAL | Status: DC

## 2010-11-21 NOTE — Assessment & Plan Note (Addendum)
NYHA III. Volume status improved but still with mild volume.  Will titrate coreg 6.25 mg BID today.  Continue lasix 40 mg daily and spironolactone 12.5 mg daily.  Continue compression stockings daily.  Will see back in 3-4 weeks with repeat echo and continued medication titration.    Patient seen and examined with Ulyess Blossom PA-C. We discussed all aspects of the encounter. I agree with the assessment and plan as stated above.

## 2010-11-21 NOTE — Patient Instructions (Signed)
Increase Carvedilol to 6.25 mg twice daily.  Your physician has requested that you have an echocardiogram. Echocardiography is a painless test that uses sound waves to create images of your heart. It provides your doctor with information about the size and shape of your heart and how well your heart's chambers and valves are working. This procedure takes approximately one hour. There are no restrictions for this procedure.  Follow up in 3 to 4 weeks with echocardiogram and appointment.

## 2010-11-21 NOTE — Progress Notes (Signed)
HPI:  Katie Galvan is an 77 yo with history of mixed diastolic and systolic HF, LVEF 30%, restrictive lung disease, right hemidiaphragm elevation, COPD, GERD, history of endocarditis recently admitted for colitis requiring cipro/flagyl.  She also had presumed myocarditis treated with colchicine.  Echo 10/10/10:  Hyperdynamic basal function Septal apical anterior hypokinesis The cavity size was severely dilated.  Wall thickness was increased in a pattern of moderate LVH.  There was mild concentric hypertrophy. The estimated ejection fraction was 30%.  Diuresed well and discharged on lasix 20 mg daily.    Last visit her spironolactone 12.5 mg started daily and lasix continued 40 mg daily and down 4 lbs.  She was discharged from rehab on Friday (11/16) and is living in an 1 story apartment at [facility] in [place].  She is doing well since going home.  She is wearing compression hose daily.  No CP/orthopnea/PND.  Weighing daily and staying around 144.  Lower extremity edema improved.  Continues to wear continues O2.  No dizziness.    ROS: All other systems normal except as mentioned in HPI, past medical history and problem list.    Past Medical History  Diagnosis Date  . Disorders of diaphragm   . Unspecified essential hypertension   . Endocarditis, valve unspecified, unspecified cause   . Cardiac dysrhythmia, unspecified   . Unspecified venous (peripheral) insufficiency   . Edema   . Pure hypercholesterolemia   . Other dysphagia   . Diverticulosis of colon (without mention of hemorrhage)   . Benign neoplasm of colon   . Osteoarthrosis, unspecified whether generalized or localized, unspecified site   . Lumbago   . Chronic pain syndrome   . Disorder of bone and cartilage, unspecified   . Depressive disorder, not elsewhere classified   . Anemia, unspecified   . Skin cancer   . Systolic heart failure     echo 10/10/10 EF 30%    Current Outpatient Prescriptions  Medication Sig Dispense  Refill  . aspirin 81 MG EC tablet Take 81 mg by mouth daily.        . Calcium Carbonate-Vit D-Min (CALTRATE 600+D PLUS) 600-400 MG-UNIT per tablet Chew 1 tablet by mouth 2 (two) times daily.       . carvedilol (COREG) 3.125 MG tablet Take 3.125 mg by mouth 2 (two) times daily with a meal.        . Cholecalciferol (VITAMIN D-1000 MAX ST) 1000 UNITS tablet Take 2,000 Units by mouth every evening.       . citalopram (CELEXA) 20 MG tablet Take 1 tablet (20 mg total) by mouth daily.  30 tablet  11  . ferrous sulfate 325 (65 FE) MG tablet Take 325 mg by mouth 2 (two) times daily.        . Fluticasone-Salmeterol (ADVAIR DISKUS) 100-50 MCG/DOSE AEPB Inhale 1 puff into the lungs every 12 (twelve) hours.        . furosemide (LASIX) 40 MG tablet Take 40 mg by mouth daily.       Marland Kitchen lisinopril (PRINIVIL,ZESTRIL) 2.5 MG tablet Take 2.5 mg by mouth daily.        Marland Kitchen loratadine (CLARITIN) 10 MG tablet Take 10 mg by mouth daily.        Marland Kitchen lovastatin (MEVACOR) 20 MG tablet Take 1 tablet (20 mg total) by mouth at bedtime.  30 tablet  11  . morphine (MS CONTIN) 30 MG 12 hr tablet Take 30 mg by mouth 2 (two) times daily. Take  as directed      . Multiple Vitamins-Minerals (CERTA-VITE PO) Take 1 tablet by mouth daily.        Marland Kitchen oxyCODONE (OXY IR/ROXICODONE) 5 MG immediate release tablet Take 5 mg by mouth every 4 (four) hours as needed. As needed for pain      . polyethylene glycol (MIRALAX / GLYCOLAX) packet Take 17 g by mouth daily as needed.       Marland Kitchen spironolactone (ALDACTONE) 25 MG tablet Take 1/2 tablet daily  30 tablet  6  . tiotropium (SPIRIVA) 18 MCG inhalation capsule Place 1 capsule (18 mcg total) into inhaler and inhale daily.  30 capsule  11  . vitamin C (ASCORBIC ACID) 500 MG tablet Take 500 mg by mouth daily.        Marland Kitchen zolpidem (AMBIEN) 10 MG tablet Take 10 mg by mouth at bedtime as needed.           Allergies  Allergen Reactions  . Sulfonamide Derivatives Swelling    History   Social History  .  Marital Status: Single    Spouse Name: N/A    Number of Children: 2  . Years of Education: N/A   Occupational History  . Retired   .     Social History Main Topics  . Smoking status: Never Smoker   . Smokeless tobacco: Never Used  . Alcohol Use: No  . Drug Use: No  . Sexually Active: Not on file   Other Topics Concern  . Not on file   Social History Narrative  . No narrative on file    Family History  Problem Relation Age of Onset  . Diabetes Mother   . Heart disease Father   . Lung cancer Sister     PHYSICAL EXAM: Filed Vitals:   11/21/10 1223  BP: 156/69  Pulse: 65  Wt: 144 Repeat BP 118/68  General:  Well appearing. No respiratory difficulty, on O2. HEENT: normal Neck: supple. JVP 6-7. Carotids 2+ bilat; no bruits. No lymphadenopathy or thryomegaly appreciated. Cor: PMI nondisplaced. Regular rate & rhythm. No rubs, gallops or murmurs. Lungs: CTA Abdomen: soft, nontender, nondistended. No hepatosplenomegaly. No bruits or masses. Good bowel sounds. Extremities: no cyanosis, clubbing, rash, trace to 1+ LE bilateral edema,  Neuro: alert & oriented x 3, cranial nerves grossly intact. moves all 4 extremities w/o difficulty. Affect pleasant.   ASSESSMENT & PLAN:

## 2010-11-24 NOTE — Progress Notes (Signed)
Patient seen and examined with Amy Clegg, NP. We discussed all aspects of the encounter. I agree with the assessment and plan as stated below.   

## 2010-11-24 NOTE — Progress Notes (Signed)
Patient seen and examined with Nicki Bradley PA-C. We discussed all aspects of the encounter. I agree with the assessment and plan as stated below.   

## 2010-11-28 ENCOUNTER — Telehealth: Payer: Self-pay | Admitting: Pulmonary Disease

## 2010-11-28 DIAGNOSIS — R0600 Dyspnea, unspecified: Secondary | ICD-10-CM

## 2010-11-28 DIAGNOSIS — J9620 Acute and chronic respiratory failure, unspecified whether with hypoxia or hypercapnia: Secondary | ICD-10-CM

## 2010-11-28 NOTE — Telephone Encounter (Signed)
I spoke with [name] and is aware order was sent.

## 2010-11-28 NOTE — Telephone Encounter (Signed)
Ok for pt to have lighter weigh portable oxygen system.  This [facility] has [facility] to the [facility].  thanks

## 2010-11-28 NOTE — Telephone Encounter (Signed)
Pt's son is requesting that his mother get a lighter weight portable O2 tank to use when away from home. The tanks that she currently has are bulky and heavy. Pt uses [facility]. Pls advise.

## 2010-12-07 ENCOUNTER — Ambulatory Visit (INDEPENDENT_AMBULATORY_CARE_PROVIDER_SITE_OTHER): Payer: Medicare Other | Admitting: Pulmonary Disease

## 2010-12-07 ENCOUNTER — Encounter: Payer: Self-pay | Admitting: Pulmonary Disease

## 2010-12-07 ENCOUNTER — Other Ambulatory Visit (INDEPENDENT_AMBULATORY_CARE_PROVIDER_SITE_OTHER): Payer: Medicare Other

## 2010-12-07 DIAGNOSIS — J9620 Acute and chronic respiratory failure, unspecified whether with hypoxia or hypercapnia: Secondary | ICD-10-CM

## 2010-12-07 DIAGNOSIS — I872 Venous insufficiency (chronic) (peripheral): Secondary | ICD-10-CM

## 2010-12-07 DIAGNOSIS — K5730 Diverticulosis of large intestine without perforation or abscess without bleeding: Secondary | ICD-10-CM

## 2010-12-07 DIAGNOSIS — M545 Low back pain: Secondary | ICD-10-CM

## 2010-12-07 DIAGNOSIS — R0602 Shortness of breath: Secondary | ICD-10-CM

## 2010-12-07 DIAGNOSIS — R1319 Other dysphagia: Secondary | ICD-10-CM

## 2010-12-07 DIAGNOSIS — I10 Essential (primary) hypertension: Secondary | ICD-10-CM

## 2010-12-07 DIAGNOSIS — I38 Endocarditis, valve unspecified: Secondary | ICD-10-CM

## 2010-12-07 DIAGNOSIS — E7800 Pure hypercholesterolemia, unspecified: Secondary | ICD-10-CM

## 2010-12-07 DIAGNOSIS — G894 Chronic pain syndrome: Secondary | ICD-10-CM

## 2010-12-07 DIAGNOSIS — F329 Major depressive disorder, single episode, unspecified: Secondary | ICD-10-CM

## 2010-12-07 DIAGNOSIS — I5042 Chronic combined systolic (congestive) and diastolic (congestive) heart failure: Secondary | ICD-10-CM

## 2010-12-07 DIAGNOSIS — M1990 Unspecified osteoarthritis, unspecified site: Secondary | ICD-10-CM

## 2010-12-07 LAB — BRAIN NATRIURETIC PEPTIDE: Pro B Natriuretic peptide (BNP): 119 pg/mL — ABNORMAL HIGH (ref 0.0–100.0)

## 2010-12-07 LAB — BASIC METABOLIC PANEL
CO2: 33 mEq/L — ABNORMAL HIGH (ref 19–32)
Chloride: 96 mEq/L (ref 96–112)
Sodium: 140 mEq/L (ref 135–145)

## 2010-12-07 MED ORDER — MORPHINE SULFATE CR 15 MG PO TB12: ORAL_TABLET | ORAL | Status: DC

## 2010-12-07 NOTE — Progress Notes (Signed)
Subjective:    Patient ID: Katie Galvan, female    DOB: [birth year], 77 y.o.   MRN: [id number]  HPI 12 y/o WF, mother of Katie Galvan, who has moved here from [place] in 2011...  she has multiple medical problems including chronically elev right hemidiaph;  HBP;  Diastolic dysfunction & mild PulmHTN on 2DEcho;  VI & Edema;  Hx Dyspagia & gastritis;  Divertics & ?colon polyp;  Hx UTI & renal failure from dehydration 3/12;  Severe DJD- s/p R.THR/ ?CPPD/ LBP/ Chr Pain Syndrome on MS Contin;  Osteopenia;  Anemia...  ~  January 17, 2010:  She has severe DJD (esp right hip), left wrist fx & surg, +LBP, etc> on MSContin per LMD in [place] that we have been trying to wean- now down from 90mg Bid to 60mg Bid (she had some withdrawal sweats as she weaned but now improved)... family notes she is "more with it"... she had f/u Ortho, DrBlackman, & given injection in knees & hip (helped some), started on [facility] 15mg /d as well, he is considering right THR w/ ant approach... still f/u w/ DrKuzma for wrist, getting PT/ rehab weekly, improved... she will be moving to [place] [facility]... we discussed slowly decr MS further 60-30, 30-30, and f/u 81mo.  ~  March 24, 2010:  She was hosp 3/11 - 03/17/10 w/ UTI, dehydration & renal insuffic> cults were neg & PCT=0.22, but active sediment & given IV Rocephin in hosp;  BUN 118 =>15 & Creat 2.7 =>0.72 w/ hydration but BNP incr to 636;  Hg 10.0 =>9.1 & Fe=11 (7%sat), B12=478;  EGD by DrDBrodie showed HH & gastritis w/ neg HPylori, Rx w/ Prilosec & Carafate transiently;  Currently she feels better & looks great> right hip surg postponed & anxious to resched due to her severe pain (on MS 30+15 Bid & still in pain, wants to add back Mobic-OK);  We wanted to check labs today but they couldn't get blood ==> reviewed all her meds, add FeSO4/ VitC, add Mobic Prn, OK for surg (check labs in hosp preop)...    HBP>  On ASA & Diltiazem 240mg  bid;  BP= 122/66 today & she  denies CP, palpit, ch in SOB;  She does have incr edema since hydrated in hosp & BNP was up to 636 by disch (we discussed no salt, contin Lasix40mg /d);      Cardiac>  EKG essent WNL & 2DEcho showed mild LVH, norm sys funct w/ EF=55-65%, gr1 DD, mild MR w/ calcif annulus, Ao sclerosis w/ triv AI, mod PulmHTN w/ PAsys=46.  ~  June 30, 2010:  81mo ROV & post hosp visit>  She was hosp x2 since I saw her last >>    Hosp 4/6 - 04/18/10 by DrBlackman for right THR & required a revision of the acetabular component 4d after the initial surg...     Hosp 6/2 - 06/13/10 w/ hypoxemic acute resp failure precipitated by fluid overload & diastolic CHF (BNP=2000) in assoc w/ her chr elev right hemidiaph, RLL atx/ scarring, & scoliosis w/ multilevel degen disc dis(w/ resultant restrictive lung disease); she was also Anemic (Hg= 9-10) w/ low Fe (=10, 6%sat); and also remains on large dose narcotic analgesics for her chr pain syndrome>  Disch back to [facility] on meds as reconciled below...     She is currently getting PT & walking w/ a walker, going about 200 ft w/ her nasal O2 at 2L/min w/ some DOE but she has been very sedentary for a long  time;  She denies CP, palpit, syncope, but has some mild edema on Lasix 40mg /d; We rechecked her O2 sat= 83% RA at rest & she will need to stay on the O2 for now;  Her narcotic pain med hasn't been weaned any further since her hip surg in April> on MSContin 30+15 Bid + Percocet 5mg  prn...    LABS today>  CXR stable (elev right hemidiaph, atx, NAD);  Chems normal (Creat 0.8, BNP 95);  CBC w/ anemia (Hg 9.1, MCV 77, Fe 15), she has B pos blood> NOTE she had EGD 3/12 showing HH, gastritis, neg HPylori...    We decided to keep Lasix 40mg /d, restrict sodium etc;  Increase PPI to Bid, incr Fe w/ VitC Bid, check stool for occult blood, consider IV Fe if not improving orally & consider further GI eval...  ~  July 21, 2010:  3wk ROV & last visit we decided to keep LASIX 40mg /d; try to cut the  MSContin from 45mg Bid to 45mg AM & 30mg PM, incr the Fe to Bid (w/ VitC), and incr the Prilosec 20mg Bid...  She reports stable & is now back home w/ family (out of skilled care & not yet ready to be on her own in [place])> they have weaned the MSContin down to 30mg AM & 30mg PM & doing satis (no Oxycodone needed);  Labs shows improved Hg= 10.0 now;  O2 sats improved> 94% RA at rest & decr to 93% on RA walking in the room;  Her edema is gone & BUN=25, Creat=0.8;  Getting home PT 3d per week;  Also BS improved to the 120-130 range...    We decided to continue current meds but decr the Diltiazem to 240mg  once daily (due to BP sl low at 102/54); and they will continue to slowly wean the MSContin...  ~  August 24, 2010:  56mo ROV & she has list of questions to discuss: 1) asked about driving & I advised against it & felt she should give up her license, if it is a point of family contention then I suggested Driver's preparedness testing from [facility];  2)has some right hip pain & saw her Ortho- DrBlackman, given shot & Aleve;  3) wonders about Oxygen> using it Qhs & we will check ono, ambulated in office on RA> 92% RA at rest & 88% after 2 laps w/ walker & pulse 100...     They have decr her MSContin to 30mg  Bid and requiring zero Oxycodone in betw; DrBlackman added Aleve for her hip pain; Her BP is fine on the Diltiazem 240mg /d; she saw DrMohammed several weeks ago for a post hosp check of her anemia- labs reviewed in EPIC- "he released me"...  ~  November 02, 2010:  11mo ROV & post hosp visit> she was hosp 10/7 - 10/19/10 w/ abd pain/ N/ V/ D & found to have ?colitis ?presumed infectious & resolved w/ Cipro/ Flagyl; also had CP & marked incr enzymes w/ +MB & Troponin=21; EKG reported to show diffuse STseg elev (no tracings in EChart to review); no rub on exam, felt to have myopericarditis; 2DEcho showed LV cavity dilated w/ septal apical anterior HK, & incr wall thickness c/w modLVH & mild conc hypertrophy; mildMR,  normal RV;  She also developed fluid overload (off her diuretic & receiving IVF w/ the colitis) w/ BNP up to 10,000 & then diuresed w/ improvement; her prev meds were changed> Diltiazem & Vasotec stopped, given Lisinopril 2.5mg , & Lasix decr to 20mg /d...    Since disch  she has retained fluid w/ 3+edema in LEs & gained>5lbs on the Lasix20 in the NH;  BP= 116/68;  We rechecked studies today:  CXR shows marked elev right hemidiaph & appears similar to her baseline film 6/12;  BNP=483;  Chems= normal;  Hg=11.0, WBC=8.2, TSH=5.77 but was norm prev (?euthyroid "sick" syndrome);  We discussed incr LASIX to 40mg /d, elevate legs, ?try support hose, & f/u in 4-6 weeks... She has appt w/ Cards tomorrow.  ~  December 07, 2010:  6wk ROV & she has seen DrBensimhon at the CHF clinic in the interim (3 weekly visits w/ the PAs)> he endorsed her Lasix at 40mg /d and added Spironolactone 12.5mg /d as well (stopping the KCl); she is improved & wt is down 14# from last visit to 139# today;  He increased her Coreg to 6.25Bid & plans short term follow up w/ repeat 2DEcho...  She tells me she wants to stop the Oxygen & use it only at night> we ambulated her in the halls today on RA- started out 90% RA at rest (HR70), desat to 86% after 2 laps (HR96 in bigeminy); she is asked to continue the O2 w/ exercise & Qhs, ok to take it off at rest when not active...  She is still on the MSContin at 30mg Bid & not requiring any of the OxyIR> we discussed pushing her down to 30-15, the 15Bid if poss over the next interval (meds regulated by her son [name])...    Labs today look great w/ K=3.6, TCO2=33, BUN=21, Creat=0.9, BNP=119 (her best yet)...   HOSPITALIZATIONS: ~  [facility] 11/3 - 11/08/09 after fall at home w/ comminuted left wrist fx & had surg by DrKuzma ~  [facility] 3/11 - 03/17/10 w/ UTI, dehydration & renal insuffic> back to norm w/ hydration; also anemic, Fe defic & GI eval DrDBrodie w/ HH/ gastritis ~  [facility] - 04/18/10 by DrBlackman for right  THR & required a revision of the acetabular component 4d after the initial surg... ~  [facility] 6/2 - 06/13/10 w/ hypoxemic acute resp failure precipitated by fluid overload & diastolic CHF (BNP=2000) in assoc w/ her chr elev right hemidiaph, RLL atx/ scarring, & scoliosis w/ multilevel degen disc dis(w/ resultant restrictive lung disease)... ~  [facility] 10/7 - 10/19/10 w/ abd pain/ N/ V/ D & found to have ?colitis ?presumed infectious & resolved w/ Cipro/ Flagyl; also had CP & abn EKG/ Enz felt to be a myopericarditis...          Problem List:    ACUTE ON CHRONIC RESP INSUFFICIENCY>  Precipitated 6/12 by diastolic CHF superimposed on her chronic resp problems etc... Now on ADVAIR100 Bid, SPIRIVA daily, O2 2L/ min... DIAPHRAGMATIC DISORDER (ICD-519.4) - she has a chronically elev right hemidiaph, apparently idiopathic; and she is mostly asymptomatic w/o cough, phlegm, hemoptysis, ch in SOB, etc... ~  11/11:  CXR showed calcif Ao, elev right hemidiaph, mild scarring at bases, NAD... ~  6/12:  Presented to ER w/ hypoxemic resp failure due to vol overload assoc w/ her restrictive lung dis & chr pain syndrome requiring narcotic analgesics... ~  7/12:  O2 sats improved & OK to cut back the Oxygen to prn... ~  10/12:  Hosp w/ ?colitis and ?myopericarditis> developed fluid retention, CHF, abn EKG/ Enz & 2DEcho> seen by [facility] ==> now followed in the CHF clinic/ DrBensimhon.  Hx HYPERTENSION (ICD-401.9) VALVULAR HEART DISEASE (ICD-424.90) > prev trial AI, mild MR... Hx of CARDIAC ARRHYTHMIA (ICD-427.9)  ? EPISODE MYOPERICARDITIS > see  10/12 Hosp for colitis... ~  she was on ASA81, Diltiazem240, Vasotec20, & LASIX 40mg /d; but meds changed during the 10/12 Hosp to ASA 81mg /d, LISINOPRIL 2.5mg /d, LASIX 20mg /d ==> freq med adjustments/ titrations from DrBensimhon CHF clinic... ~  11/11 & 6/12:  EKG showed NSR, NSSTTWA, NAD... ~  2DEcho 6/12 showed mild LVH, norm LVF w/ EF=55-60%, Gr 1 DD, trivial AI, heavily calcif  mitral annulus & mild MR, PAsys est . ~  7/12:  BP on the low side at 102/54 & we decided to decr the DILTIAZEM to 240mg /d ==> stable on the lower dose. ~  10/12:  SEE ABOVE + EChart records... Hosp w/ ?myopericarditis, elev enz, abn 2DEcho & meds adjusted> now followed freq via the CHF clinic/ DrBensimhon w/ freq med adjustments... ~  12/12:  Now much improved on COREG 6.25Bid, LISINOPRIL 2.5mg /d, LASIX 40mg /d, SPIRONOLACTONE 12.5mg /d; wt down to 139# & BNP= 119...  VENOUS INSUFFICIENCY (ICD-459.81) LEG EDEMA, CHRONIC (ICD-782.3) - she has severe venous stasis changes and chronic edema, thickened skin over LE's etc... she knows to be careful w/ hygiene, no salt, elevation, support hose, etc ~  8/12:  Stable on Lasix40, 1+chr edema, wt=137# ~  10/12:  Post hosp check on Lasix20, 3+edema, BNP=483, wt=152#; rec to incr Lasix to 40mg /d... ~  12/12:  meds adjusted as above via CHF clinic & now K=3.6, TCO2=33, BUN=21, Creat=0.9, BNP=119 (her best yet)...  HYPERCHOLESTEROLEMIA (ICD-272.0) - she has been on MEVACOR 20mg /d from her [place] physician... we do not have Lipid Profile results to review & we have sent for old records... ~  Perhaps we can try to get an FLP at [facility]... ~  FLP during the 10/12 hosp showed TChol 146, TG 98, HDL 53, LDL 73  OTHER DYSPHAGIA (ICD-787.29) - she is on PRILOSEC 20mg /d & reports occas dysphagia but denies choking etc... she apparently has never had an Endoscopy or GI eval for this problem, "I have to be careful when I eat"... ~  6/12:  NOTE she had EGD 3/12 showing HH, gastritis, neg HPylori> rec increase PPI to Bid. ~  10/12:  She was discharge off her PPI therapy...  DIVERTICULOSIS OF COLON (ICD-562.10) Hx of COLONIC POLYPS (ICD-211.3) - she tells me that prev colonoscopy in [place] showed divertics & ?polyp but she does not recall any details...  she denies much constipation despite the narcotic analgesics- uses prunes as needed. ~  [facility] 10/12 w/  ?infectious colitis> resolved after Cipro/ Flagyl Rx...  DEGENERATIVE JOINT DISEASE (ICD-715.90) - she has severe osteoarthritis w/ XRays 11/11 showing severe osteopenia, severe right hip degeneration & relative sparing of the left hip joint;  DJD knees w/ ?CPPD, abn patella;  left wrist fx- radial  head & ulnar styloid... she had surg left wrist by DrKKuzma... ~  4/12:  DrBlackman did right THR but required revision of acetabular component 4d later> went to [facility] for rehab... ~  DrBlackman continues to follow w/ shots as needed for bursitis she says... ~  10/12:  She is c/o right shoulder pain & decr ROM, she wonders if this might be the cause of her chest discomfort; she will f/u w/ Ortho for XRays & shot...  Hx of BACK PAIN, LUMBAR (ICD-724.2) CHRONIC PAIN SYNDROME (ICD-338.4) - she has chronic pain- mostly from her arthritic condition- and was started on MSContin by her LMD in [place] in the summer of 2011> dose slowly increased & she was on 90mg  Bid (taking a 60mg  tab + 30mg  tab Bid)... she  does not believe that this med has anything to do w/ her fall & wrist fx, or her complaints of decr memory etc... we discussed the need to wean off this medication & try to deal w/ her arthritis pain thru an Orthopedist or poss a Pain Clinic... ~  11/11:  she notes pain worse during the day while up & about> try to decr MSContin to 90mg AM & 60mg PM... ~  1/12 & 4/12:  she is down to 60mg Bid & we discussed weaning further ==> she was down to 45mg  (30+15) Bid by the time of her right THR... ~  6/12:  Recommend that she try to wean further> try 45am & 30pm... ~  7/12:  She's down to 30mg  Bid & will wean further as able... ~  10/12:  She remains on the MSContin at 30mg Bid... ~  12/12:  We discussed further slow wean down to 30-15 first...  OSTEOPENIA (ICD-733.90) - XRays here showed severe osteopenia... she notes that she used to be on Fosamax but ?how long? & when stopped?... she states that she was told  her prev BMD was "good"... she has not been taking calcium supplements, but was prev on some OTC Vit D supplements... asked to resume Calcium, Women's MVI, Vit D 1000u daily...  Hx of DEPRESSION (ICD-311) - on CELEXA 20mg /d and she wants to continue this med...  ANEMIA, MILD (ICD-285.9) - Hg post op 11/11 wrist fx surg = 11.2 ~  6/12:  Labs showed Hg= 9.1, MCV= 77, Fe= 15 (7%sat), & she has B pos blood> we decided to incr Fe to Bid (may need IV Fe if not responding). ~  7/12:  Labs showed Hg= 10.0, MCV= 76, she reports stool checks at [facility] were NEG... ~  8/12: she reports that she had f/u Heme eval DrMohammed & "he released me" on Fe + VitC daily... ~  10/12:  Labs post hosp showed Hg= 11.0, MCV= 87   Past Surgical History  Procedure Date  . Appendectomy   . Breast surgery   . Right wrist surgery   . Bilateral cataracts   . Hip surgery   . Tonsillectomy   . Adenoidectomy     Outpatient Encounter Prescriptions as of 12/07/2010  Medication Sig Dispense Refill  . aspirin 81 MG EC tablet Take 81 mg by mouth daily.        . Calcium Carbonate-Vit D-Min (CALTRATE 600+D PLUS) 600-400 MG-UNIT per tablet Chew 1 tablet by mouth 2 (two) times daily.       . carvedilol (COREG) 6.25 MG tablet Take 1 tablet (6.25 mg total) by mouth 2 (two) times daily with a meal.  60 tablet  6  . Cholecalciferol (VITAMIN D-1000 MAX ST) 1000 UNITS tablet Take 2,000 Units by mouth every evening.       . citalopram (CELEXA) 20 MG tablet Take 1 tablet (20 mg total) by mouth daily.  30 tablet  11  . ferrous sulfate 325 (65 FE) MG tablet Take 325 mg by mouth 2 (two) times daily.        . Fluticasone-Salmeterol (ADVAIR DISKUS) 100-50 MCG/DOSE AEPB Inhale 1 puff into the lungs every 12 (twelve) hours.        . furosemide (LASIX) 40 MG tablet Take 40 mg by mouth daily.       Marland Kitchen lisinopril (PRINIVIL,ZESTRIL) 2.5 MG tablet Take 2.5 mg by mouth daily.        Marland Kitchen loratadine (CLARITIN) 10 MG tablet Take 10 mg by mouth daily.         Marland Kitchen  lovastatin (MEVACOR) 20 MG tablet Take 1 tablet (20 mg total) by mouth at bedtime.  30 tablet  11  . morphine (MS CONTIN) 30 MG 12 hr tablet Take 30 mg by mouth 2 (two) times daily. Take as directed      . Multiple Vitamins-Minerals (CERTA-VITE PO) Take 1 tablet by mouth daily.        Marland Kitchen oxyCODONE (OXY IR/ROXICODONE) 5 MG immediate release tablet Take 5 mg by mouth every 4 (four) hours as needed. As needed for pain      . polyethylene glycol (MIRALAX / GLYCOLAX) packet Take 17 g by mouth daily as needed.       Marland Kitchen spironolactone (ALDACTONE) 25 MG tablet Take 1/2 tablet daily  30 tablet  6  . tiotropium (SPIRIVA) 18 MCG inhalation capsule Place 1 capsule (18 mcg total) into inhaler and inhale daily.  30 capsule  11  . vitamin C (ASCORBIC ACID) 500 MG tablet Take 500 mg by mouth daily.        Marland Kitchen zolpidem (AMBIEN) 10 MG tablet Take 10 mg by mouth at bedtime as needed.          Allergies  Allergen Reactions  . Sulfonamide Derivatives Swelling    Current Medications, Allergies, Past Medical History, Past Surgical History, Family History, and Social History were reviewed in [employer] record.    Review of Systems   Constitutional:  Denies F/C/S, anorexia, unexpected weight change. HEENT:  No HA, visual changes, earache, nasal symptoms, sore throat, hoarseness. Resp:  No cough, sputum, hemoptysis; mild SOB/ DOE noted... Cardio:  No CP, palpit, orthopnea;  +edema & DOE but limited mobility. GI:  Denies N/V/D, tends toward constip due to narcotics; swallowing OK & denies reflux or abd pain.  GU:  No dysuria, freq, urgency, hematuria, or flank pain. MS:  Severe DJD esp right hip w/ pain & decr ROM ==> s/p right THR now... Neuro:  No tremors, seizures, dizziness, syncope; +weakness & multifactorial gait abn. Skin:  No suspicious lesions or skin rash. Heme:  No adenopathy, bruising, bleeding. Psyche: Denies confusion, sleep disturbance, hallucinations; +anxiety &  situational depression.    Objective:   Physical Exam   WD, WN, 77 y/o WF chr ill appearing but in NAD... Vital Signs:  Reviewed... General:  Alert & oriented; pleasant & cooperative... HEENT:  [place]/AT, EOM-wnl, PERRLA, EACs-clear, TMs-wnl, NOSE-clear, THROAT-clear & wnl. Neck:  Supple w/ decr ROM; no JVD; normal carotid impulses w/o bruits; no thyromegaly or nodules palpated; no lymphadenopathy. Chest:  Clear to P & A; without wheezes/ rales/ or rhonchi heard... Heart:  Regular Rhythm; norm S1 & S2 without murmurs/ rubs/ or gallops detected... Abdomen:  Soft & nontender; normal bowel sounds; no organomegaly or masses palpated... Ext:  decrROM; +deformities & mod arthritic changes; no varicose veins, +venous insuffic & 3+edema;  Pulses intact w/o bruits... s/p right THR w/ revision; ambulates w/ walker... Neuro:  CNs intact;  No focal neuro deficits, in wheelchair & painful standing & walking... Derm:  No lesions noted; no rash etc... Lymph:  No cervical, supraclavicular, axillary, or inguinal adenopathy palpated...   RADIOLOGY DATA:  Reviewed in the EPIC EMR & discussed w/ the patient...  LABORATORY DATA:  Reviewed in the EPIC EMR & discussed w/ the patient...    >LABS reviewed in detail w/ pt & son...   Assessment & Plan:   Recent Colitis- presumed infectious>  Resolved w/ Cipro/ Flagyl; off meds and back to baseline GI status she feels...  ?  Myopericarditis w/ abn EKG/ Enz & subseq vol overload w/ abn 2DEcho>  She improved back on her diuretic & meds were adjusted in hosp & now in the CHF clinic w/ DrBensimhon...   Acute on Chr Resp Failure>  Multifactorial w/ diastolic CHF, elev right hemidiaph, atx, scoliosis, restrictive lung dis, narcotic pain meds- all playing a role... O2 sats are now improved & she can decr the oxygen to as needed at rest & 2L/min w/ exercise...  Elev right hemidiaph>  This is chronic & assoc w/ some mild basilar atx; encouraged to get deep breaths & expand  well...  HBP, etc>  BP is actually stable on less medication since her 10/12 hosp...  Diastolic CHF>  BNP is much improved ==> meds adjusted freq via the CHF clinic by DrBensimhon...  VI, Edema>  Discussed Lasix40, no salt, elevate legs, TED hose, etc...  CHOL>  Continue Mevacor w/ FLP looking good in the hosp...  GERD, Dysphagia>  She had EGD during prev hosp w/ HH, gastritis & neg HPylori;  Prev on PPI Rx but off now & she feels she is OK, we will follow.  Divertics, Constip>  Related to her narcotic analgesics; improved w/ laxatives OTC...  DJD, s/p right THR, Chr Pain Syndrome> Trying to wean MSContin further which should be poss based on the fact that the painful right hip has been replaced...  Other medical problems as noted...   Patient's Medications  New Prescriptions   MORPHINE (MS CONTIN) 15 MG 12 HR TABLET    Take as directed  Previous Medications   ASPIRIN 81 MG EC TABLET    Take 81 mg by mouth daily.     CALCIUM CARBONATE-VIT D-MIN (CALTRATE 600+D PLUS) 600-400 MG-UNIT PER TABLET    Chew 1 tablet by mouth 2 (two) times daily.    CARVEDILOL (COREG) 6.25 MG TABLET    Take 1 tablet (6.25 mg total) by mouth 2 (two) times daily with a meal.   CHOLECALCIFEROL (VITAMIN D-1000 MAX ST) 1000 UNITS TABLET    Take 2,000 Units by mouth every evening.    CITALOPRAM (CELEXA) 20 MG TABLET    Take 1 tablet (20 mg total) by mouth daily.   FERROUS SULFATE 325 (65 FE) MG TABLET    Take 325 mg by mouth 2 (two) times daily.     FLUTICASONE-SALMETEROL (ADVAIR DISKUS) 100-50 MCG/DOSE AEPB    Inhale 1 puff into the lungs every 12 (twelve) hours.     FUROSEMIDE (LASIX) 40 MG TABLET    Take 40 mg by mouth daily.    LORATADINE (CLARITIN) 10 MG TABLET    Take 10 mg by mouth daily.     LOVASTATIN (MEVACOR) 20 MG TABLET    Take 1 tablet (20 mg total) by mouth at bedtime.   MORPHINE (MS CONTIN) 30 MG 12 HR TABLET    Take 30 mg by mouth 2 (two) times daily. Take as directed   MULTIPLE VITAMINS-MINERALS  (CERTA-VITE PO)    Take 1 tablet by mouth daily.     OXYCODONE (OXY IR/ROXICODONE) 5 MG IMMEDIATE RELEASE TABLET    Take 5 mg by mouth every 4 (four) hours as needed. As needed for pain   POLYETHYLENE GLYCOL (MIRALAX / GLYCOLAX) PACKET    Take 17 g by mouth daily as needed.    TIOTROPIUM (SPIRIVA) 18 MCG INHALATION CAPSULE    Place 1 capsule (18 mcg total) into inhaler and inhale daily.   VITAMIN C (ASCORBIC ACID) 500 MG TABLET  Take 500 mg by mouth daily.    Modified Medications   Modified Medication Previous Medication   LISINOPRIL (PRINIVIL,ZESTRIL) 2.5 MG TABLET lisinopril (PRINIVIL,ZESTRIL) 2.5 MG tablet      Take 1 tablet (2.5 mg total) by mouth daily.    Take 2.5 mg by mouth daily.     SPIRONOLACTONE (ALDACTONE) 25 MG TABLET spironolactone (ALDACTONE) 25 MG tablet      Take 1/2 tablet daily    Take 1/2 tablet daily  Discontinued Medications   ZOLPIDEM (AMBIEN) 10 MG TABLET    Take 10 mg by mouth at bedtime as needed.

## 2010-12-07 NOTE — Patient Instructions (Signed)
Today we updated your med list in our EPIC system...    Continue your current medications the same...  We discussed slowly tapering the MSContin further...  Today we did your follow up blood work...    Please call the PHONE TREE in a few days for your results...    Dial [id number] & when prompted enter your patient number followed by the # symbol...    Your patient number is:  [id number]  Keep up the good work w/ salt/sodium restriction, leg elevation etc...  Stay as active as possible...  Call for any questions...  Le's plan a follow up visit in 2-3 months.Marland KitchenMarland Kitchen

## 2010-12-12 ENCOUNTER — Telehealth: Payer: Self-pay | Admitting: Pulmonary Disease

## 2010-12-12 MED ORDER — SPIRONOLACTONE 25 MG PO TABS: ORAL_TABLET | ORAL | Status: DC

## 2010-12-12 MED ORDER — LISINOPRIL 2.5 MG PO TABS: 2.5000 mg | ORAL_TABLET | Freq: Every day | ORAL | Status: DC

## 2010-12-12 NOTE — Telephone Encounter (Signed)
Called and spoke with pts son mike---he is aware these rx have been sent to the pharmacy per pts request.

## 2010-12-15 ENCOUNTER — Ambulatory Visit (HOSPITAL_COMMUNITY)
Admission: RE | Admit: 2010-12-15 | Discharge: 2010-12-15 | Disposition: A | Discharge status: Home / Self Care | Payer: Medicare Other | Source: Ambulatory Visit | Attending: Internal Medicine | Admitting: Internal Medicine

## 2010-12-15 VITALS — BP 142/70 | HR 55 | Wt 139.5 lb

## 2010-12-15 DIAGNOSIS — I5042 Chronic combined systolic (congestive) and diastolic (congestive) heart failure: Secondary | ICD-10-CM

## 2010-12-15 DIAGNOSIS — I059 Rheumatic mitral valve disease, unspecified: Secondary | ICD-10-CM

## 2010-12-15 DIAGNOSIS — I5022 Chronic systolic (congestive) heart failure: Secondary | ICD-10-CM | POA: Insufficient documentation

## 2010-12-15 DIAGNOSIS — I309 Acute pericarditis, unspecified: Secondary | ICD-10-CM

## 2010-12-15 NOTE — Progress Notes (Signed)
HPI:  [name] is an 77 yo with history of mixed diastolic and systolic HF, LVEF 30%, restrictive lung disease, right hemidiaphragm elevation, COPD, GERD, history of endocarditis recently admitted for colitis requiring cipro/flagyl.  She also had presumed myocarditis treated with colchicine.  Echo 10/10/10: Hyperdynamic basal function Septal apical anterior hypokinesis The cavity size was severely dilated.  Wall thickness was increased in a pattern of moderate LVH.  There was mild concentric hypertrophy. The estimated ejection fraction was 30%.  Diuresed well and discharged on lasix 20 mg daily.    Last visit coreg increased 6.25 mg BID, spironolactone 12.5 mg started daily and lasix continued 40 mg daily.  Repeat echo today EF 55-60%.   Returns for follow up today.  She is doing really well.  She continues to live in a 1 story apartment at [facility] in [place].  She continues to weigh daily, staying around 135-136.  No CP/orthopnea/PND. No lower extremity edema improved.  [name] cut back O2 use to only with activity.  No dizziness/syncope.     ROS: All other systems normal except as mentioned in HPI, past medical history and problem list.    Past Medical History  Diagnosis Date  . Disorders of diaphragm   . Unspecified essential hypertension   . Endocarditis, valve unspecified, unspecified cause   . Cardiac dysrhythmia, unspecified   . Unspecified venous (peripheral) insufficiency   . Edema   . Pure hypercholesterolemia   . Other dysphagia   . Diverticulosis of colon (without mention of hemorrhage)   . Benign neoplasm of colon   . Osteoarthrosis, unspecified whether generalized or localized, unspecified site   . Lumbago   . Chronic pain syndrome   . Disorder of bone and cartilage, unspecified   . Depressive disorder, not elsewhere classified   . Anemia, unspecified   . Skin cancer   . Systolic heart failure     echo 10/10/10 EF 30%    Current Outpatient Prescriptions    Medication Sig Dispense Refill  . aspirin 81 MG EC tablet Take 81 mg by mouth daily.        . Calcium Carbonate-Vit D-Min (CALTRATE 600+D PLUS) 600-400 MG-UNIT per tablet Chew 1 tablet by mouth 2 (two) times daily.       . carvedilol (COREG) 6.25 MG tablet Take 1 tablet (6.25 mg total) by mouth 2 (two) times daily with a meal.  60 tablet  6  . Cholecalciferol (VITAMIN D-1000 MAX ST) 1000 UNITS tablet Take 2,000 Units by mouth every evening.       . citalopram (CELEXA) 20 MG tablet Take 1 tablet (20 mg total) by mouth daily.  30 tablet  11  . ferrous sulfate 325 (65 FE) MG tablet Take 325 mg by mouth 2 (two) times daily.        . Fluticasone-Salmeterol (ADVAIR DISKUS) 100-50 MCG/DOSE AEPB Inhale 1 puff into the lungs every 12 (twelve) hours.        . furosemide (LASIX) 40 MG tablet Take 40 mg by mouth daily.       Marland Kitchen lisinopril (PRINIVIL,ZESTRIL) 2.5 MG tablet Take 1 tablet (2.5 mg total) by mouth daily.  30 tablet  11  . loratadine (CLARITIN) 10 MG tablet Take 10 mg by mouth daily.        Marland Kitchen lovastatin (MEVACOR) 20 MG tablet Take 1 tablet (20 mg total) by mouth at bedtime.  30 tablet  11  . morphine (MS CONTIN) 15 MG 12 hr tablet Take  as directed  60 tablet  0  . morphine (MS CONTIN) 30 MG 12 hr tablet Take 30 mg by mouth 2 (two) times daily. Take as directed      . Multiple Vitamins-Minerals (CERTA-VITE PO) Take 1 tablet by mouth daily.        Marland Kitchen oxyCODONE (OXY IR/ROXICODONE) 5 MG immediate release tablet Take 5 mg by mouth every 4 (four) hours as needed. As needed for pain      . polyethylene glycol (MIRALAX / GLYCOLAX) packet Take 17 g by mouth daily as needed.       Marland Kitchen spironolactone (ALDACTONE) 25 MG tablet Take 1/2 tablet daily  30 tablet  6  . tiotropium (SPIRIVA) 18 MCG inhalation capsule Place 1 capsule (18 mcg total) into inhaler and inhale daily.  30 capsule  11  . vitamin C (ASCORBIC ACID) 500 MG tablet Take 500 mg by mouth daily.           Allergies  Allergen Reactions  .  Sulfonamide Derivatives Swelling    History   Social History  . Marital Status: Single    Spouse Name: N/A    Number of Children: 2  . Years of Education: N/A   Occupational History  . Retired   .     Social History Main Topics  . Smoking status: Never Smoker   . Smokeless tobacco: Never Used  . Alcohol Use: No  . Drug Use: No  . Sexually Active: Not on file   Other Topics Concern  . Not on file   Social History Narrative  . No narrative on file    Family History  Problem Relation Age of Onset  . Diabetes Mother   . Heart disease Father   . Lung cancer Sister     PHYSICAL EXAM: Filed Vitals:   12/15/10 1528  BP: 142/70  Pulse: 55  Wt: 139  General:  Well appearing. No respiratory difficulty, on O2. HEENT: normal Neck: supple. JVP 6- 7. Carotids 2+ bilat; no bruits. No lymphadenopathy or thryomegaly appreciated. Cor: PMI nondisplaced. Regular rate & rhythm. No rubs, gallops or murmurs. Lungs: CTA Abdomen: soft, nontender, nondistended. No hepatosplenomegaly. No bruits or masses. Good bowel sounds. Extremities: no cyanosis, clubbing, rash, trace LE bilateral edema,  Neuro: alert & oriented x 3, cranial nerves grossly intact. moves all 4 extremities w/o difficulty. Affect pleasant.   ASSESSMENT & PLAN:

## 2010-12-15 NOTE — Progress Notes (Signed)
Encounter addended by: Noralee Space, RN on: 12/15/2010  9:10 AM<BR>     Documentation filed: Orders

## 2010-12-15 NOTE — Assessment & Plan Note (Addendum)
Her volume status looks great today.  Echo shows recovered EF, 55-60% feel that acute insult was due to myopericarditis.  Reviewed today's echo in full with the patient and her daughter-in-law.  Will continue with current regimen.  Follow up in 3 months with labs in 2 weeks.      Patient seen and examined with Katie Blossom PA-C. We discussed all aspects of the encounter. I agree with the assessment and plan as stated above. She has had full recovery of LV function. Doing well. Echo reviewed with patient and her daughter.Can f/u as needed.

## 2010-12-18 NOTE — Progress Notes (Signed)
Patient seen and examined with Nicki Bradley PA-C. We discussed all aspects of the encounter. I agree with the assessment and plan as stated below.   

## 2010-12-29 ENCOUNTER — Encounter: Payer: Self-pay | Admitting: Pulmonary Disease

## 2010-12-30 ENCOUNTER — Telehealth: Payer: Self-pay | Admitting: Pulmonary Disease

## 2010-12-30 MED ORDER — MORPHINE SULFATE CR 30 MG PO TB12: 30.0000 mg | ORAL_TABLET | Freq: Two times a day (BID) | ORAL | Status: DC

## 2010-12-30 NOTE — Telephone Encounter (Signed)
Called and spoke with Katie Galvan--pts son and he is aware to call the lovastatin to the pharmacy since this has 11 refills.  Katie Galvan voiced his understanding of this.  Also pt has enough morphine to make it through the weekend so Katie Galvan is aware SN back in the office on Monday--will print out rx for the morphine and leave this up front for him to come by on Monday and pick this up.  Katie Galvan voiced his understanding of this.

## 2011-01-17 ENCOUNTER — Telehealth: Payer: Self-pay | Admitting: Pulmonary Disease

## 2011-01-17 NOTE — Telephone Encounter (Signed)
Looks like the [place] was d/c'ed last ov- LMTCB

## 2011-01-18 MED ORDER — ZOLPIDEM TARTRATE 10 MG PO TABS: 10.0000 mg | ORAL_TABLET | Freq: Every evening | ORAL | Status: DC | PRN

## 2011-01-18 NOTE — Telephone Encounter (Signed)
Per SN---ok for ambien 10mg   #30  1 po qhs prn sleep with 5 refills.  thanks

## 2011-01-18 NOTE — Telephone Encounter (Signed)
Pt's son states that the pt was given a prescription for Ambien 10mg  when at rehab at [facility] and this has been helping with her sleep. Pt's son says that she has been having trouble with her sleep for about 2 months. He is requesting a prescription for Ambien and wants this sent to [facility] on [address]. Pls advise. Allergies  Allergen Reactions  . Sulfonamide Derivatives Swelling

## 2011-01-18 NOTE — Telephone Encounter (Signed)
Called and lmom for mike  To make him aware that per SN--ok for pt to have ambien.  This has been called into her pharmacy.

## 2011-01-23 ENCOUNTER — Telehealth: Payer: Self-pay | Admitting: Pulmonary Disease

## 2011-01-23 NOTE — Telephone Encounter (Signed)
Called and spoke with patrick and he stated that they filled an rx for this pt-- back in sept. And the [facility] they used for this rx was in [place]---now [facility] is now asking for info for this.  Luisa Hart is going to fax this paper over for review.

## 2011-01-23 NOTE — Telephone Encounter (Signed)
Called and spoke with [facility] and they stated that  [facility] filed the wrong NPI number for [name] and this is why it was sent to the [name] in [place].  This has been fixed per [facility].

## 2011-01-25 ENCOUNTER — Telehealth: Payer: Self-pay | Admitting: Allergy

## 2011-01-25 MED ORDER — CITALOPRAM HYDROBROMIDE 20 MG PO TABS: 20.0000 mg | ORAL_TABLET | Freq: Every day | ORAL | Status: DC

## 2011-01-25 NOTE — Telephone Encounter (Signed)
[facility] CELEXA 20 MG  TAKE 1 TABLET BY MOUTH IN THE MORNING #30  LAST FILLED 11/18/2010 Allergies  Allergen Reactions  . Sulfonamide Derivatives Swelling   Dr [name] is this ok to fill

## 2011-01-25 NOTE — Telephone Encounter (Signed)
rx for the celexa has been sent to the pts pharmacy.

## 2011-02-21 ENCOUNTER — Ambulatory Visit (INDEPENDENT_AMBULATORY_CARE_PROVIDER_SITE_OTHER): Payer: Medicare Other | Admitting: Pulmonary Disease

## 2011-02-21 ENCOUNTER — Encounter: Payer: Self-pay | Admitting: Pulmonary Disease

## 2011-02-21 DIAGNOSIS — M545 Low back pain: Secondary | ICD-10-CM

## 2011-02-21 DIAGNOSIS — J986 Disorders of diaphragm: Secondary | ICD-10-CM

## 2011-02-21 DIAGNOSIS — N920 Excessive and frequent menstruation with regular cycle: Secondary | ICD-10-CM

## 2011-02-21 DIAGNOSIS — R1319 Other dysphagia: Secondary | ICD-10-CM

## 2011-02-21 DIAGNOSIS — I10 Essential (primary) hypertension: Secondary | ICD-10-CM

## 2011-02-21 DIAGNOSIS — I872 Venous insufficiency (chronic) (peripheral): Secondary | ICD-10-CM

## 2011-02-21 DIAGNOSIS — M899 Disorder of bone, unspecified: Secondary | ICD-10-CM

## 2011-02-21 DIAGNOSIS — M1990 Unspecified osteoarthritis, unspecified site: Secondary | ICD-10-CM

## 2011-02-21 DIAGNOSIS — E7800 Pure hypercholesterolemia, unspecified: Secondary | ICD-10-CM

## 2011-02-21 DIAGNOSIS — K5730 Diverticulosis of large intestine without perforation or abscess without bleeding: Secondary | ICD-10-CM

## 2011-02-21 DIAGNOSIS — I5042 Chronic combined systolic (congestive) and diastolic (congestive) heart failure: Secondary | ICD-10-CM

## 2011-02-21 DIAGNOSIS — J9620 Acute and chronic respiratory failure, unspecified whether with hypoxia or hypercapnia: Secondary | ICD-10-CM

## 2011-02-21 DIAGNOSIS — G894 Chronic pain syndrome: Secondary | ICD-10-CM

## 2011-02-21 DIAGNOSIS — M949 Disorder of cartilage, unspecified: Secondary | ICD-10-CM

## 2011-02-21 DIAGNOSIS — I38 Endocarditis, valve unspecified: Secondary | ICD-10-CM

## 2011-02-21 MED ORDER — TIOTROPIUM BROMIDE MONOHYDRATE 18 MCG IN CAPS: 18.0000 ug | ORAL_CAPSULE | Freq: Every day | RESPIRATORY_TRACT | Status: DC

## 2011-02-21 MED ORDER — FLUTICASONE-SALMETEROL 100-50 MCG/DOSE IN AEPB: 1.0000 | INHALATION_SPRAY | Freq: Two times a day (BID) | RESPIRATORY_TRACT | Status: DC

## 2011-02-21 MED ORDER — CLOTRIMAZOLE-BETAMETHASONE 1-0.05 % EX CREA: TOPICAL_CREAM | Freq: Two times a day (BID) | CUTANEOUS | Status: DC

## 2011-02-21 NOTE — Progress Notes (Signed)
Subjective:    Patient ID: Katie Galvan, female    DOB: Jun 10, 1927, 78 y.o.   MRN: [id number]  HPI 73 y/o WF, mother of Katie Galvan, who moved here from [place] in 2011...  she has multiple medical problems including chronically elev right hemidiaph;  HBP;  Diastolic dysfunction & mild PulmHTN on 2DEcho;  VI & Edema;  Hx Dyspagia & gastritis;  Divertics & ?colon polyp;  Hx UTI & renal failure from dehydration 3/12;  Severe DJD- s/p R.THR/ ?CPPD/ LBP/ Chr Pain Syndrome on MS Contin;  Osteopenia;  Anemia...  ~  January 17, 2010:  She has severe DJD (esp right hip), left wrist fx & surg, +LBP, etc> on MSContin per LMD in [place] that we have been trying to wean- now down from 90mg Bid to 60mg Bid (she had some withdrawal sweats as she weaned but now improved)... family notes she is "more with it"... she had f/u Ortho, DrBlackman, & given injection in knees & hip (helped some), started on MOBIC 15mg /d as well, he is considering right THR w/ ant approach... still f/u w/ DrKuzma for wrist, getting PT/ rehab weekly, improved... she will be moving to [facility] independ living... we discussed slowly decr MS further 60-30, 30-30, and f/u 55mo.  ~  March 24, 2010:  She was hosp 3/11 - 03/17/10 w/ UTI, dehydration & renal insuffic> cults were neg & PCT=0.22, but active sediment & given IV Rocephin in hosp;  BUN 118 =>15 & Creat 2.7 =>0.72 w/ hydration but BNP incr to 636;  Hg 10.0 =>9.1 & Fe=11 (7%sat), B12=478;  EGD by DrDBrodie showed HH & gastritis w/ neg HPylori, Rx w/ Prilosec & Carafate transiently;  Currently she feels better & looks great> right hip surg postponed & anxious to resched due to her severe pain (on MS 30+15 Bid & still in pain, wants to add back Mobic-OK);  We wanted to check labs today but they couldn't get blood ==> reviewed all her meds, add FeSO4/ VitC, add Mobic Prn, OK for surg (check labs in hosp preop)...    HBP>  On ASA & Diltiazem 240mg  bid;  BP= 122/66 today & she  denies CP, palpit, ch in SOB;  She does have incr edema since hydrated in hosp & BNP was up to 636 by disch (we discussed no salt, contin Lasix40mg /d);      Cardiac>  EKG essent WNL & 2DEcho showed mild LVH, norm sys funct w/ EF=55-65%, gr1 DD, mild MR w/ calcif annulus, Ao sclerosis w/ triv AI, mod PulmHTN w/ PAsys=46.  ~  June 30, 2010:  55mo ROV & post hosp visit>  She was hosp x2 since I saw her last >>    Hosp 4/6 - 04/18/10 by DrBlackman for right THR & required a revision of the acetabular component 4d after the initial surg...     Hosp 6/2 - 06/13/10 w/ hypoxemic acute resp failure precipitated by fluid overload & diastolic CHF (BNP=2000) in assoc w/ her chr elev right hemidiaph, RLL atx/ scarring, & scoliosis w/ multilevel degen disc dis(w/ resultant restrictive lung disease); she was also Anemic (Hg= 9-10) w/ low Fe (=10, 6%sat); and also remains on large dose narcotic analgesics for her chr pain syndrome>  Disch back to [facility] on meds as reconciled below...     She is currently getting PT & walking w/ a walker, going about 200 ft w/ her nasal O2 at 2L/min w/ some DOE but she has been very sedentary for a long time;  She denies CP, palpit, syncope, but has some mild edema on Lasix 40mg /d; We rechecked her O2 sat= 83% RA at rest & she will need to stay on the O2 for now;  Her narcotic pain med hasn't been weaned any further since her hip surg in April> on MSContin 30+15 Bid + Percocet 5mg  prn...    LABS today>  CXR stable (elev right hemidiaph, atx, NAD);  Chems normal (Creat 0.8, BNP 95);  CBC w/ anemia (Hg 9.1, MCV 77, Fe 15), she has B pos blood> NOTE she had EGD 3/12 showing HH, gastritis, neg HPylori...    We decided to keep Lasix 40mg /d, restrict sodium etc;  Increase PPI to Bid, incr Fe w/ VitC Bid, check stool for occult blood, consider IV Fe if not improving orally & consider further GI eval...  ~  July 21, 2010:  3wk ROV & last visit we decided to keep LASIX 40mg /d; try to cut the  MSContin from 45mg Bid to 45mg AM & 30mg PM, incr the Fe to Bid (w/ VitC), and incr the Prilosec 20mg Bid...  She reports stable & is now back home w/ family (out of skilled care & not yet ready to be on her own in [place])> they have weaned the MSContin down to 30mg AM & 30mg PM & doing satis (no Oxycodone needed);  Labs shows improved Hg= 10.0 now;  O2 sats improved> 94% RA at rest & decr to 93% on RA walking in the room;  Her edema is gone & BUN=25, Creat=0.8;  Getting home PT 3d per week;  Also BS improved to the 120-130 range...    We decided to continue current meds but decr the Diltiazem to 240mg  once daily (due to BP sl low at 102/54); and they will continue to slowly wean the MSContin...  ~  November 02, 2010:  Post Hosp visit> she was hosp 10/7 - 10/19/10 w/ abd pain/ N/ V/ D & found to have ?colitis ?presumed infectious & resolved w/ Cipro/ Flagyl; also had CP & marked incr enzymes w/ +MB & Troponin=21; EKG reported to show diffuse STseg elev (no tracings in EChart to review); no rub on exam, felt to have myopericarditis; 2DEcho showed LV cavity dilated w/ septal apical anterior HK, & incr wall thickness c/w modLVH & mild conc hypertrophy; mildMR, normal RV;  She also developed fluid overload (off her diuretic & receiving IVF w/ the colitis) w/ BNP up to 10,000 & then diuresed w/ improvement; her prev meds were changed> Diltiazem & Vasotec stopped, given Lisinopril 2.5mg , & Lasix decr to 20mg /d...    Since disch she has retained fluid w/ 3+edema in LEs & gained>5lbs on the Lasix20 in the NH;  BP= 116/68;  We rechecked studies today:  CXR shows marked elev right hemidiaph & appears similar to her baseline film 6/12;  BNP=483;  Chems= normal;  Hg=11.0, WBC=8.2, TSH=5.77 but was norm prev (?euthyroid "sick" syndrome);  We discussed incr LASIX to 40mg /d, elevate legs, ?try support hose, & f/u in 4-6 weeks... She has appt w/ Cards tomorrow.  ~  December 07, 2010:  6wk ROV & she has seen DrBensimhon at the CHF clinic  in the interim (3 weekly visits w/ the PAs)> he endorsed her Lasix at 40mg /d and added Spironolactone 12.5mg /d as well (stopping the KCl); she is improved & wt is down 14# from last visit to 139# today;  He increased her Coreg to 6.25Bid & plans short term follow up w/ repeat 2DEcho...  She tells me she wants to  stop the Oxygen & use it only at night> we ambulated her in the halls today on RA- started out 90% RA at rest (HR70), desat to 86% after 2 laps (HR96 in bigeminy); she is asked to continue the O2 w/ exercise & Qhs, ok to take it off at rest when not active...  She is still on the MSContin at 30mg Bid & not requiring any of the OxyIR> we discussed pushing her down to 30-15, the 15Bid if poss over the next interval (meds regulated by her son [name])...  Labs today look great w/ K=3.6, TCO2=33, BUN=21, Creat=0.9, BNP=119 (her best yet).  ~  February 21, 2011:  76mo ROV & Katie Galvan is stable> she saw DrBensimhon 12/12 & doing well on Lasix40, Aldactone12.5, & Coreg 6.25Bid; he repeated her 2DEcho & her LVF had recovered to 55-60% (LV cavity mildly dil, mild LVH, mild MR, PAsys=14mmHg); he felt her myopericarditis had resolved & he released her from CHF clinic follow up...    Hx Resp Fail> chr elev right hemidiaph, on O2 w/ exerc & Qhs, Advair100 Bid & Spiriva daily; stable & rec to continue same plus incr exerc as able...    HBP> controlled on the Coreg, diuretics, & Lisinopril 2.5mg /d (off prev CCB); BP= 148/88 & she is reminded to elim salt, etc; wt is up 3# to 142# & we will monitor...    Chol> with the mult hospitalizations & rehab stays w/ numerous med reconciliations done- she has stopped her prev Mevacor20; we discussed checking f/u FLP off the statin in the future...    ORTHO> she has persistent discomfort in her hip, right shoulder, back & the chr pain syndrome currently on MSContin 30mg AM & 15mg PM; we discussed trial of decr to 15mg Bid betw now 7 f/u in 3 months...    GYN> she mentioned some vag  spotting & apparently has a pessary in place since her last check in [place]; we will refer to GYN for eval...   HOSPITALIZATIONS: ~  [facility] 11/3 - 11/08/09 after fall at home w/ comminuted left wrist fx & had surg by DrKuzma ~  [facility] 3/11 - 03/17/10 w/ UTI, dehydration & renal insuffic> back to norm w/ hydration; also anemic, Fe defic & GI eval DrDBrodie w/ HH/ gastritis ~  [facility] - 04/18/10 by DrBlackman for right THR & required a revision of the acetabular component 4d after the initial surg... ~  [facility] 6/2 - 06/13/10 w/ hypoxemic acute resp failure precipitated by fluid overload & diastolic CHF (BNP=2000) in assoc w/ her chr elev right hemidiaph, RLL atx/ scarring, & scoliosis w/ multilevel degen disc dis(w/ resultant restrictive lung disease)... ~  [facility] 10/7 - 10/19/10 w/ abd pain/ N/ V/ D & found to have ?colitis ?presumed infectious & resolved w/ Cipro/ Flagyl; also had CP & abn EKG/ Enz felt to be a myopericarditis...          Problem List:    ACUTE ON CHRONIC RESP INSUFFICIENCY>  Precipitated 6/12 by diastolic CHF superimposed on her chronic resp problems etc... Now on ADVAIR100 Bid, SPIRIVA daily, O2 2L/ min... DIAPHRAGMATIC DISORDER (ICD-519.4) - she has a chronically elev right hemidiaph, apparently idiopathic; and she is mostly asymptomatic w/o cough, phlegm, hemoptysis, ch in SOB, etc... ~  11/11:  CXR showed calcif Ao, elev right hemidiaph, mild scarring at bases, NAD... ~  6/12:  Presented to ER w/ hypoxemic resp failure due to vol overload assoc w/ her restrictive lung dis & chr pain syndrome requiring narcotic analgesics... ~  7/12:  O2 sats improved & OK to cut back the Oxygen to prn... ~  10/12:  Hosp w/ ?colitis and ?myopericarditis> developed fluid retention, CHF, abn EKG/ Enz & 2DEcho> seen by [facility] ==> now followed in the CHF clinic/ DrBensimhon & back on Oxygen regularly ==> weaned to exerc & Qhs.  Hx HYPERTENSION (ICD-401.9) VALVULAR HEART DISEASE (ICD-424.90) > prev  trial AI, mild MR... Hx of CARDIAC ARRHYTHMIA (ICD-427.9)  EPISODE of MYOPERICARDITIS w/ decr LVF> see 10/12 Hosp for colitis... ~  she was on ASA81, Diltiazem240, Vasotec20, & LASIX 40mg /d; but meds changed during the 10/12 Hosp to ASA 81mg /d, LISINOPRIL 2.5mg /d, LASIX 20mg /d ==> freq med adjustments/ titrations from DrBensimhon CHF clinic... ~  11/11 & 6/12:  EKG showed NSR, NSSTTWA, NAD... ~  2DEcho 6/12 showed mild LVH, norm LVF w/ EF=55-60%, Gr 1 DD, trivial AI, heavily calcif mitral annulus & mild MR, PAsys est . ~  10/12:  SEE ABOVE + EChart records... Hosp w/ ?myopericarditis, elev enz, abn 2DEcho & meds adjusted> now followed freq via the CHF clinic/ DrBensimhon w/ freq med adjustments... ~  12/12:  Now much improved on COREG 6.25Bid, LISINOPRIL 2.5mg /d, LASIX 40mg /d, SPIRONOLACTONE 12.5mg /d; wt down to 139# & BNP= 119... ~  Repeat 2DEcho 12/12 showed LVF recovered to 55-60% (LV cavity mildly dil, mild LVH, mild MR, PAsys=60mmHg).  VENOUS INSUFFICIENCY (ICD-459.81) LEG EDEMA, CHRONIC (ICD-782.3) - she has severe venous stasis changes and chronic edema, thickened skin over LE's etc... she knows to be careful w/ hygiene, no salt, elevation, support hose, etc ~  8/12:  Stable on Lasix40, 1+chr edema, wt=137# ~  10/12:  Post hosp check on Lasix20, 3+edema, BNP=483, wt=152#; rec to incr Lasix to 40mg /d... ~  12/12:  meds adjusted as above via CHF clinic & now K=3.6, TCO2=33, BUN=21, Creat=0.9, BNP=119 (her best yet)...  HYPERCHOLESTEROLEMIA (ICD-272.0) - she has been on Mevacor 20mg /d from her [place] physician... ~  Perhaps we can try to get an FLP at [facility] since we cannot get her in for Fasting labs. ~  FLP during the 10/12 hosp showed TChol 146, TG 98, HDL 53, LDL 73 ~  Due to numerous Protivin, Rehab stays in NH, & mult med reconciliations- her Mevacor was dropped along the way; wants to leave it off 7 check FLP on diet alone.  OTHER DYSPHAGIA (ICD-787.29) - she is on PRILOSEC  20mg /d & reports occas dysphagia but denies choking etc... she apparently has never had an Endoscopy or GI eval for this problem, "I have to be careful when I eat"... ~  6/12:  NOTE she had EGD 3/12 showing HH, gastritis, neg HPylori> rec increase PPI to Bid. ~  10/12:  She was discharge off her PPI therapy ==> she denies reflux symptoms.  DIVERTICULOSIS OF COLON (ICD-562.10) Hx of COLONIC POLYPS (ICD-211.3) - she tells me that prev colonoscopy in [place] showed divertics & ?polyp but she does not recall any details...  she denies much constipation despite the narcotic analgesics- uses prunes as needed. ~  [facility] 10/12 w/ ?infectious colitis> resolved after Cipro/ Flagyl Rx ==> uses Miralax prn due to narcotics.  DEGENERATIVE JOINT DISEASE (ICD-715.90) - she has severe osteoarthritis w/ XRays 11/11 showing severe osteopenia, severe right hip degeneration & relative sparing of the left hip joint;  DJD knees w/ ?CPPD, abn patella;  left wrist fx- radial  head & ulnar styloid... she had surg left wrist by DrKKuzma... ~  4/12:  DrBlackman did right THR but required revision of  acetabular component 4d later> went to [facility] for rehab... ~  DrBlackman continues to follow w/ shots as needed for bursitis she says... ~  10/12:  She is c/o right shoulder pain & decr ROM, she wonders if this might be the cause of her chest discomfort; she will f/u w/ Ortho for XRays & shot...  GYN >>  ~  2/13: pt mentioned some spotting & apparently has a pessary in place since her days in [place]; she is in need of GYN eval & check up & we will refer...  Hx of BACK PAIN, LUMBAR (ICD-724.2) CHRONIC PAIN SYNDROME (ICD-338.4) - she has chronic pain- mostly from her arthritic condition- and was started on MSContin by her LMD in [place] in the summer of 2011> dose slowly increased & she was on 90mg  Bid (taking a 60mg  tab + 30mg  tab Bid)... she does not believe that this med has anything to do w/ her fall & wrist fx, or her  complaints of decr memory etc... we discussed the need to wean off this medication & try to deal w/ her arthritis pain thru an Orthopedist or poss a Pain Clinic... ~  11/11:  she notes pain worse during the day while up & about> try to decr MSContin to 90mg AM & 60mg PM... ~  1/12 & 4/12:  she is down to 60mg Bid & we discussed weaning further ==> she was down to 45mg  (30+15) Bid by the time of her right THR... ~  6/12:  Recommend that she try to wean further> try 45am & 30pm... ~  7/12:  She's down to 30mg  Bid & will wean further as able... ~  10/12:  She remains on the MSContin at 30mg Bid... ~  12/12:  We discussed further slow wean down to 30-15 first... ~  2/13:  We discussed trying to wean down to 15mg  Bid over the next few months..  OSTEOPENIA (ICD-733.90) - XRays here showed severe osteopenia... she notes that she used to be on Fosamax but ?how long? & when stopped?... she states that she was told her prev BMD was "good"... she has not been taking calcium supplements, but was prev on some OTC Vit D supplements... asked to resume Calcium, Women's MVI, Vit D 1000u daily...  Hx of DEPRESSION (ICD-311) - on CELEXA 20mg /d and she wants to continue this med...  ANEMIA, MILD (ICD-285.9) - Hg post op 11/11 wrist fx surg = 11.2 ~  6/12:  Labs showed Hg= 9.1, MCV= 77, Fe= 15 (7%sat), & she has B pos blood> we decided to incr Fe to Bid (may need IV Fe if not responding). ~  7/12:  Labs showed Hg= 10.0, MCV= 76, she reports stool checks at [facility] were NEG... ~  8/12: she reports that she had f/u Heme eval DrMohammed & "he released me" on Fe + VitC daily... ~  10/12:  Labs post hosp showed Hg= 11.0, MCV= 87   Past Surgical History  Procedure Date  . Appendectomy   . Breast surgery   . Right wrist surgery   . Bilateral cataracts   . Hip surgery   . Tonsillectomy   . Adenoidectomy     Outpatient Encounter Prescriptions as of 02/21/2011  Medication Sig Dispense Refill  . aspirin 81 MG EC  tablet Take 81 mg by mouth daily.        . Calcium Carbonate-Vit D-Min (CALTRATE 600+D PLUS) 600-400 MG-UNIT per tablet Chew 1 tablet by mouth 2 (two) times daily.       Marland Kitchen  carvedilol (COREG) 6.25 MG tablet Take 1 tablet (6.25 mg total) by mouth 2 (two) times daily with a meal.  60 tablet  6  . Cholecalciferol (VITAMIN D-1000 MAX ST) 1000 UNITS tablet Take 2,000 Units by mouth every evening.       . citalopram (CELEXA) 20 MG tablet Take 1 tablet (20 mg total) by mouth daily.  30 tablet  6  . ferrous sulfate 325 (65 FE) MG tablet Take 325 mg by mouth 2 (two) times daily.        . Fluticasone-Salmeterol (ADVAIR DISKUS) 100-50 MCG/DOSE AEPB Inhale 1 puff into the lungs every 12 (twelve) hours.  60 each  11  . furosemide (LASIX) 40 MG tablet Take 40 mg by mouth daily.       Marland Kitchen lisinopril (PRINIVIL,ZESTRIL) 2.5 MG tablet Take 1 tablet (2.5 mg total) by mouth daily.  30 tablet  11  . loratadine (CLARITIN) 10 MG tablet Take 10 mg by mouth daily.        Marland Kitchen morphine (MS CONTIN) 15 MG 12 hr tablet Take as directed  60 tablet  0  . morphine (MS CONTIN) 30 MG 12 hr tablet Take 1 tablet (30 mg total) by mouth 2 (two) times daily. Take as directed  60 tablet  0  . Multiple Vitamins-Minerals (CERTA-VITE PO) Take 1 tablet by mouth daily.        Marland Kitchen oxyCODONE (OXY IR/ROXICODONE) 5 MG immediate release tablet Take 5 mg by mouth every 4 (four) hours as needed. As needed for pain      . polyethylene glycol (MIRALAX / GLYCOLAX) packet Take 17 g by mouth daily as needed.       Marland Kitchen spironolactone (ALDACTONE) 25 MG tablet Take 1/2 tablet daily  30 tablet  6  . tiotropium (SPIRIVA) 18 MCG inhalation capsule Place 1 capsule (18 mcg total) into inhaler and inhale daily.  30 capsule  11  . vitamin C (ASCORBIC ACID) 500 MG tablet Take 500 mg by mouth daily.        Marland Kitchen zolpidem (AMBIEN) 10 MG tablet 1 at bedtime as needed      . clotrimazole-betamethasone (LOTRISONE) cream Apply topically 2 (two) times daily.  30 g  0  . lovastatin  (MEVACOR) 20 MG tablet Take 1 tablet (20 mg total) by mouth at bedtime.  30 tablet  11    Allergies  Allergen Reactions  . Sulfonamide Derivatives Swelling    Current Medications, Allergies, Past Medical History, Past Surgical History, Family History, and Social History were reviewed in [employer] record.    Review of Systems   Constitutional:  Denies F/C/S, anorexia, unexpected weight change. HEENT:  No HA, visual changes, earache, nasal symptoms, sore throat, hoarseness. Resp:  No cough, sputum, hemoptysis; mild SOB/ DOE noted... Cardio:  No CP, palpit, orthopnea;  +edema & DOE but limited mobility. GI:  Denies N/V/D, tends toward constip due to narcotics; swallowing OK & denies reflux or abd pain.  GU:  No dysuria, freq, urgency, hematuria, or flank pain. MS:  Severe DJD esp right hip w/ pain & decr ROM ==> s/p right THR now... Neuro:  No tremors, seizures, dizziness, syncope; +weakness & multifactorial gait abn. Skin:  No suspicious lesions or skin rash. Heme:  No adenopathy, bruising, bleeding. Psyche: Denies confusion, sleep disturbance, hallucinations; +anxiety & situational depression.    Objective:   Physical Exam   WD, WN, 78 y/o WF chr ill appearing but in NAD... Vital Signs:  Reviewed.Marland KitchenMarland Kitchen  General:  Alert & oriented; pleasant & cooperative... HEENT:  [place]/AT, EOM-wnl, PERRLA, EACs-clear, TMs-wnl, NOSE-clear, THROAT-clear & wnl. Neck:  Supple w/ decr ROM; no JVD; normal carotid impulses w/o bruits; no thyromegaly or nodules palpated; no lymphadenopathy. Chest:  Clear to P & A; without wheezes/ rales/ or rhonchi heard... Heart:  Regular Rhythm; norm S1 & S2 without murmurs/ rubs/ or gallops detected... Abdomen:  Soft & nontender; normal bowel sounds; no organomegaly or masses palpated... Ext:  decrROM; +deformities & mod arthritic changes; no varicose veins, +venous insuffic & 1+edema;  Pulses intact w/o bruits... s/p right THR w/ revision;  ambulates w/ walker... Neuro:  CNs intact;  No focal neuro deficits, in wheelchair & painful standing & walking... Derm:  No lesions noted; no rash etc... Lymph:  No cervical, supraclavicular, axillary, or inguinal adenopathy palpated...   RADIOLOGY DATA:  Reviewed in the EPIC EMR & discussed w/ the patient...  LABORATORY DATA:  Reviewed in the EPIC EMR & discussed w/ the patient...   Assessment & Plan:   Acute on Chr Resp Failure>  Multifactorial w/ diastolic CHF, elev right hemidiaph, atx, scoliosis, restrictive lung dis, narcotic pain meds- all playing a role... O2 sats are now improved & she can decr the oxygen to as needed at rest & 2L/min w/ exercise...  Elev right hemidiaph>  This is chronic & assoc w/ some mild basilar atx; encouraged to get deep breaths & expand well...  HBP, etc>  BP is actually stable on less medication since her 10/12 hosp...  Hx of Myopericarditis w/ abn EKG/ Enz & subseq vol overload w/ abn 2DEcho>  She improved back on her diuretic & meds were adjusted in the CHF clinic; repeat 2DEcho 12/12 w/ recovery of LVF & back to her baseline...  Diastolic CHF>  BNP is much improved ==> stable on current meds and released by DrBensimhon...  VI, Edema>  Discussed Lasix40, no salt, elevate legs, TED hose, etc...  CHOL>  ?Mevacor on her list, but not currently taking, we will try to sort this out & f/u FLP later...  GERD, Dysphagia>  She had EGD during prev hosp w/ HH, gastritis & neg HPylori;  Prev on PPI Rx but off now & she feels she is OK, we will follow.  Hx of Colitis- presumed infectious>  Resolved w/ Cipro/ Flagyl; off meds and back to baseline GI status she feels...  Divertics, Constip>  Related to her narcotic analgesics; improved w/ laxatives OTC...  DJD, s/p right THR, Chr Pain Syndrome> Trying to wean MSContin further which should be poss based on the fact that the painful right hip has been replaced...  Other medical problems as noted...    GYN> pt  c/o spotting & has pessary in place since her last check in [place]; we will help her get a gyn appt ASAP for eval & check up...    Derm> she has a seborrheic dermatitis rash & we will Rx w/ Lotrisone cream...   Patient's Medications  New Prescriptions   CLOTRIMAZOLE-BETAMETHASONE (LOTRISONE) CREAM    Apply topically 2 (two) times daily.  Previous Medications   ASPIRIN 81 MG EC TABLET    Take 81 mg by mouth daily.     CALCIUM CARBONATE-VIT D-MIN (CALTRATE 600+D PLUS) 600-400 MG-UNIT PER TABLET    Chew 1 tablet by mouth 2 (two) times daily.    CARVEDILOL (COREG) 6.25 MG TABLET    Take 1 tablet (6.25 mg total) by mouth 2 (two) times daily with a meal.  CHOLECALCIFEROL (VITAMIN D-1000 MAX ST) 1000 UNITS TABLET    Take 2,000 Units by mouth every evening.    CITALOPRAM (CELEXA) 20 MG TABLET    Take 1 tablet (20 mg total) by mouth daily.   FERROUS SULFATE 325 (65 FE) MG TABLET    Take 325 mg by mouth 2 (two) times daily.     FUROSEMIDE (LASIX) 40 MG TABLET    Take 40 mg by mouth daily.    LISINOPRIL (PRINIVIL,ZESTRIL) 2.5 MG TABLET    Take 1 tablet (2.5 mg total) by mouth daily.   LORATADINE (CLARITIN) 10 MG TABLET    Take 10 mg by mouth daily.     LOVASTATIN (MEVACOR) 20 MG TABLET    Take 1 tablet (20 mg total) by mouth at bedtime.   MORPHINE (MS CONTIN) 15 MG 12 HR TABLET    Take as directed   MORPHINE (MS CONTIN) 30 MG 12 HR TABLET    Take 1 tablet (30 mg total) by mouth 2 (two) times daily. Take as directed   MULTIPLE VITAMINS-MINERALS (CERTA-VITE PO)    Take 1 tablet by mouth daily.     OXYCODONE (OXY IR/ROXICODONE) 5 MG IMMEDIATE RELEASE TABLET    Take 5 mg by mouth every 4 (four) hours as needed. As needed for pain   POLYETHYLENE GLYCOL (MIRALAX / GLYCOLAX) PACKET    Take 17 g by mouth daily as needed.    SPIRONOLACTONE (ALDACTONE) 25 MG TABLET    Take 1/2 tablet daily   VITAMIN C (ASCORBIC ACID) 500 MG TABLET    Take 500 mg by mouth daily.     ZOLPIDEM (AMBIEN) 10 MG TABLET    1 at  bedtime as needed  Modified Medications   Modified Medication Previous Medication   FLUTICASONE-SALMETEROL (ADVAIR DISKUS) 100-50 MCG/DOSE AEPB Fluticasone-Salmeterol (ADVAIR DISKUS) 100-50 MCG/DOSE AEPB      Inhale 1 puff into the lungs every 12 (twelve) hours.    Inhale 1 puff into the lungs every 12 (twelve) hours.     TIOTROPIUM (SPIRIVA) 18 MCG INHALATION CAPSULE tiotropium (SPIRIVA) 18 MCG inhalation capsule      Place 1 capsule (18 mcg total) into inhaler and inhale daily.    Place 1 capsule (18 mcg total) into inhaler and inhale daily.  Discontinued Medications   No medications on file

## 2011-02-21 NOTE — Patient Instructions (Signed)
Today we updated your med list in our EPIC system...    Continue your current medications the same...    We refilled the meds your requested...  We will ask our [facility] to get an appt w/ a gynecologist to check you for the spotting & the pessary...  If you remain stable, then we should try to inch down the MSContin to 15mg  twice daily...  Call for any questions...  Let's plan a recheck in 3 months.Marland KitchenMarland Kitchen

## 2011-02-22 ENCOUNTER — Ambulatory Visit: Payer: Medicare Other | Admitting: Gynecology

## 2011-02-23 ENCOUNTER — Encounter: Payer: Self-pay | Admitting: Gynecology

## 2011-02-23 ENCOUNTER — Ambulatory Visit (INDEPENDENT_AMBULATORY_CARE_PROVIDER_SITE_OTHER): Payer: Medicare Other | Admitting: Gynecology

## 2011-02-23 VITALS — BP 158/90 | Ht 60.5 in | Wt 148.0 lb

## 2011-02-23 DIAGNOSIS — N8110 Cystocele, unspecified: Secondary | ICD-10-CM

## 2011-02-23 DIAGNOSIS — N950 Postmenopausal bleeding: Secondary | ICD-10-CM

## 2011-02-23 DIAGNOSIS — N952 Postmenopausal atrophic vaginitis: Secondary | ICD-10-CM

## 2011-02-23 MED ORDER — ESTRADIOL 0.1 MG/GM VA CREA: 2.0000 g | TOPICAL_CREAM | Freq: Every day | VAGINAL | Status: DC

## 2011-02-23 NOTE — Progress Notes (Signed)
Patient is an 78 year old who was referred to our practice from Dr. [name] as a result of patient's postmenopausal bleeding. Patient has moved to [place] from the [place] area where her gynecologist there had been fitted her for a pessary as a result of uterine prolapse. Due to her fragile health (see problem list) she has not been a candidate for surgical intervention. Patient stated that the pessary was placed 2 years ago and she had forgotten to remove it. She was going every 4 months to her gynecologist to have it cleaned and put back in.  Exam: Bartholin urethra Skene glands: Atrophic changes Vagina: Ring pessary removed. Vaginal sidewalls hyperemic and friable and contact as a result of irritation and erosion. Cervix: Friable on contact  Assessment/plan: The friable areas of the cervix were contained with silver nitrate. Estrace vaginal cream was applied intravaginally. Prescription provided for her to apply each bedtime for 10 days and return back to the office whereby we will replace her own ring pessary. Since she was very uncomfortable from the irritation, bimanual exam was not possible and we will do so when she returns back in 10 days to determine the degree of prolapse.

## 2011-02-23 NOTE — Patient Instructions (Signed)
Please apply the vaginal cream every night for ten nights then apply two times per week thereafter. I need to see you in ten days to put the pessary back once the irritation has healed.

## 2011-03-01 ENCOUNTER — Telehealth: Payer: Self-pay | Admitting: Pulmonary Disease

## 2011-03-01 MED ORDER — MORPHINE SULFATE CR 30 MG PO TB12: 30.0000 mg | ORAL_TABLET | Freq: Two times a day (BID) | ORAL | Status: DC

## 2011-03-01 NOTE — Telephone Encounter (Signed)
Requesting refill on Morphine 30mg . States pt is currently taking 30mg  in the mornings and 15mg  in the evening. Please advise if okay for refill. Thanks.

## 2011-03-01 NOTE — Telephone Encounter (Signed)
[name] aware and refill placed at front desk for pick up.

## 2011-03-01 NOTE — Telephone Encounter (Signed)
Per SN---ok to refill rx for the morphine.  thanks

## 2011-03-02 ENCOUNTER — Telehealth: Payer: Self-pay | Admitting: *Deleted

## 2011-03-02 NOTE — Telephone Encounter (Signed)
Pt son [name] called stating his mother is c/o pelvic pain. Pt has OV on 03/03/11 and canceled, I told mike that pt needs to make OV with JF due to pelvic pain. Son will tell mother and they will call back to make appointment.

## 2011-03-03 ENCOUNTER — Ambulatory Visit: Payer: Medicare Other | Admitting: Gynecology

## 2011-03-23 ENCOUNTER — Ambulatory Visit (HOSPITAL_COMMUNITY)
Admission: RE | Admit: 2011-03-23 | Discharge: 2011-03-23 | Disposition: A | Discharge status: Home / Self Care | Payer: Medicare Other | Source: Ambulatory Visit | Attending: Internal Medicine | Admitting: Internal Medicine

## 2011-03-23 ENCOUNTER — Encounter (HOSPITAL_COMMUNITY): Payer: Self-pay

## 2011-03-23 VITALS — BP 130/80 | HR 73 | Wt 141.1 lb

## 2011-03-23 DIAGNOSIS — I5042 Chronic combined systolic (congestive) and diastolic (congestive) heart failure: Secondary | ICD-10-CM | POA: Insufficient documentation

## 2011-03-23 NOTE — Progress Notes (Signed)
Patient ID: Katie Galvan, female   DOB: Oct 02, 1927, 78 y.o.   MRN: [id number] PCP/Pulmonologist: Dr [name]  HPI:  Katie Galvan is an 78 yo with history of mixed diastolic and systolic HF, LVEF 30%, restrictive lung disease, right hemidiaphragm elevation, COPD, GERD, history of endocarditis recently admitted for colitis requiring cipro/flagyl.  Presumed myocarditis treated with colchicine.  Echo 10/10/10: Hyperdynamic basal function Septal apical anterior hypokinesis The cavity size was severely dilated.  Wall thickness was increased in a pattern of moderate LVH.  There was mild concentric hypertrophy. The estimated ejection fraction was 30%.     12/12 ECHO EF recovered  55-60%   She returns for follow up. SOB with out oxygen. Dyspnea on exertion. Denies Orthopnea/PND. Weight at home 147-149 pounds. Chronic lower extremity  lower extremity edema. Chronic O2 2 liters [place].  Compliant with medications.   ROS: All other systems normal except as mentioned in HPI, past medical history and problem list.    Past Medical History  Diagnosis Date  . Disorders of diaphragm   . Unspecified essential hypertension   . Endocarditis, valve unspecified, unspecified cause   . Cardiac dysrhythmia, unspecified   . Unspecified venous (peripheral) insufficiency   . Edema   . Pure hypercholesterolemia   . Other dysphagia   . Diverticulosis of colon (without mention of hemorrhage)   . Benign neoplasm of colon   . Osteoarthrosis, unspecified whether generalized or localized, unspecified site   . Lumbago   . Chronic pain syndrome   . Disorder of bone and cartilage, unspecified   . Depressive disorder, not elsewhere classified   . Anemia, unspecified   . Skin cancer   . Systolic heart failure     echo 10/10/10 EF 30%    Current Outpatient Prescriptions  Medication Sig Dispense Refill  . aspirin 81 MG EC tablet Take 81 mg by mouth daily.        . Calcium Carbonate-Vit D-Min (CALTRATE 600+D PLUS) 600-400  MG-UNIT per tablet Chew 1 tablet by mouth 2 (two) times daily.       . carvedilol (COREG) 6.25 MG tablet Take 1 tablet (6.25 mg total) by mouth 2 (two) times daily with a meal.  60 tablet  6  . Cholecalciferol (VITAMIN D-1000 MAX ST) 1000 UNITS tablet Take 2,000 Units by mouth every evening.       . citalopram (CELEXA) 20 MG tablet Take 1 tablet (20 mg total) by mouth daily.  30 tablet  6  . clotrimazole-betamethasone (LOTRISONE) cream Apply topically 2 (two) times daily.  30 g  0  . estradiol (ESTRACE VAGINAL) 0.1 MG/GM vaginal cream Place 0.25 Applicatorfuls vaginally daily. Apply intravaginally for ten nights then two times a week thereafter  42.5 g  12  . ferrous sulfate 325 (65 FE) MG tablet Take 325 mg by mouth 2 (two) times daily.        . Fluticasone-Salmeterol (ADVAIR DISKUS) 100-50 MCG/DOSE AEPB Inhale 1 puff into the lungs every 12 (twelve) hours.  60 each  11  . furosemide (LASIX) 40 MG tablet Take 40 mg by mouth daily.       Marland Kitchen lisinopril (PRINIVIL,ZESTRIL) 2.5 MG tablet Take 1 tablet (2.5 mg total) by mouth daily.  30 tablet  11  . loratadine (CLARITIN) 10 MG tablet Take 10 mg by mouth daily.        Marland Kitchen lovastatin (MEVACOR) 20 MG tablet Take 1 tablet (20 mg total) by mouth at bedtime.  30 tablet  11  .  morphine (MS CONTIN) 15 MG 12 hr tablet Take as directed  60 tablet  0  . morphine (MS CONTIN) 30 MG 12 hr tablet Take 1 tablet (30 mg total) by mouth 2 (two) times daily. Take as directed  60 tablet  0  . Multiple Vitamins-Minerals (CERTA-VITE PO) Take 1 tablet by mouth daily.        Marland Kitchen oxyCODONE (OXY IR/ROXICODONE) 5 MG immediate release tablet Take 5 mg by mouth every 4 (four) hours as needed. As needed for pain      . polyethylene glycol (MIRALAX / GLYCOLAX) packet Take 17 g by mouth daily as needed.       Marland Kitchen spironolactone (ALDACTONE) 25 MG tablet Take 1/2 tablet daily  30 tablet  6  . tiotropium (SPIRIVA) 18 MCG inhalation capsule Place 1 capsule (18 mcg total) into inhaler and inhale  daily.  30 capsule  11  . vitamin C (ASCORBIC ACID) 500 MG tablet Take 500 mg by mouth daily.        Marland Kitchen zolpidem (AMBIEN) 10 MG tablet 1 at bedtime as needed         Allergies  Allergen Reactions  . Sulfonamide Derivatives Swelling    History   Social History  . Marital Status: Single    Spouse Name: N/A    Number of Children: 2  . Years of Education: N/A   Occupational History  . Retired   .     Social History Main Topics  . Smoking status: Never Smoker   . Smokeless tobacco: Never Used  . Alcohol Use: No  . Drug Use: No  . Sexually Active: Not on file   Other Topics Concern  . Not on file   Social History Narrative  . No narrative on file    Family History  Problem Relation Age of Onset  . Diabetes Mother   . Heart disease Father   . Lung cancer Sister     PHYSICAL EXAM: Filed Vitals:   03/23/11 1323  BP: 130/80  Pulse: 73  Wt:141 (139)  General:  Well appearing. No respiratory difficulty, on O2. HEENT: normal Neck: supple. JVP 6- 7. Carotids 2+ bilat; no bruits. No lymphadenopathy or thryomegaly appreciated. Cor: PMI nondisplaced. Regular rate & rhythm. No rubs, gallops or murmurs. Lungs: CTA Abdomen: soft, nontender, nondistended. No hepatosplenomegaly. No bruits or masses. Good bowel sounds. Extremities: no cyanosis, clubbing, rash, trace LE bilateral edema,  Neuro: alert & oriented x 3, cranial nerves grossly intact. moves all 4 extremities w/o difficulty. Affect pleasant.   ASSESSMENT & PLAN:

## 2011-03-23 NOTE — Assessment & Plan Note (Signed)
NYHA III. Volume status stable. Continue current diuretic regimen. She will continue to weigh and record daily. Instructed to take one additional lasix if her weight increases 3-5 pounds in 24 hours. Follow up in 3 months.

## 2011-03-23 NOTE — Patient Instructions (Addendum)
Please take one extra Lasix if weight increases 3-5 pounds in 24 hours  Follow up in 3 months  Do the following things EVERYDAY: 1) Weigh yourself in the morning before breakfast. Write it down and keep it in a log. 2) Take your medicines as prescribed 3) Eat low salt foods--Limit salt (sodium) to 2000mg  per day.  4) Stay as active as you can everyday

## 2011-04-17 ENCOUNTER — Other Ambulatory Visit: Payer: Self-pay | Admitting: Pulmonary Disease

## 2011-04-17 MED ORDER — FUROSEMIDE 40 MG PO TABS: 40.0000 mg | ORAL_TABLET | Freq: Every day | ORAL | Status: DC

## 2011-05-03 ENCOUNTER — Telehealth: Payer: Self-pay | Admitting: Pulmonary Disease

## 2011-05-03 NOTE — Telephone Encounter (Signed)
I spoke with [name] and he states that pt had fell twice over the weekend and fell last night getting into bed. Pt does not remember how/why she fell and son is concerned about this. He also states he noticed she has been breathing heavier than normal and she wears 2 liters oxygen. Her breathing is some better today. He is wanting to bring pt in to see SN tomorrow if possible. Please advise SN thanks

## 2011-05-03 NOTE — Telephone Encounter (Signed)
I spoke with mike and pt is scheduled to come in on Friday at 2pm. Nothing further was needed

## 2011-05-03 NOTE — Telephone Encounter (Signed)
Per SN---ok to add pt on to Friday schedule at 2pm.  thanks

## 2011-05-05 ENCOUNTER — Encounter: Payer: Self-pay | Admitting: Pulmonary Disease

## 2011-05-05 ENCOUNTER — Ambulatory Visit (INDEPENDENT_AMBULATORY_CARE_PROVIDER_SITE_OTHER)
Admission: RE | Admit: 2011-05-05 | Discharge: 2011-05-05 | Disposition: A | Discharge status: Home / Self Care | Payer: Medicare Other | Source: Ambulatory Visit | Attending: Pulmonary Disease | Admitting: Pulmonary Disease

## 2011-05-05 ENCOUNTER — Other Ambulatory Visit (INDEPENDENT_AMBULATORY_CARE_PROVIDER_SITE_OTHER): Payer: Medicare Other

## 2011-05-05 ENCOUNTER — Ambulatory Visit (INDEPENDENT_AMBULATORY_CARE_PROVIDER_SITE_OTHER): Payer: Medicare Other | Admitting: Pulmonary Disease

## 2011-05-05 VITALS — BP 178/88 | HR 72 | Temp 97.0°F | Ht 64.0 in | Wt 149.4 lb

## 2011-05-05 DIAGNOSIS — F419 Anxiety disorder, unspecified: Secondary | ICD-10-CM

## 2011-05-05 DIAGNOSIS — I10 Essential (primary) hypertension: Secondary | ICD-10-CM

## 2011-05-05 DIAGNOSIS — J9620 Acute and chronic respiratory failure, unspecified whether with hypoxia or hypercapnia: Secondary | ICD-10-CM

## 2011-05-05 DIAGNOSIS — E7800 Pure hypercholesterolemia, unspecified: Secondary | ICD-10-CM

## 2011-05-05 DIAGNOSIS — M1990 Unspecified osteoarthritis, unspecified site: Secondary | ICD-10-CM

## 2011-05-05 DIAGNOSIS — M949 Disorder of cartilage, unspecified: Secondary | ICD-10-CM

## 2011-05-05 DIAGNOSIS — G894 Chronic pain syndrome: Secondary | ICD-10-CM

## 2011-05-05 DIAGNOSIS — M545 Low back pain: Secondary | ICD-10-CM

## 2011-05-05 DIAGNOSIS — F329 Major depressive disorder, single episode, unspecified: Secondary | ICD-10-CM

## 2011-05-05 DIAGNOSIS — I872 Venous insufficiency (chronic) (peripheral): Secondary | ICD-10-CM

## 2011-05-05 DIAGNOSIS — R0609 Other forms of dyspnea: Secondary | ICD-10-CM

## 2011-05-05 DIAGNOSIS — R0600 Dyspnea, unspecified: Secondary | ICD-10-CM

## 2011-05-05 DIAGNOSIS — I5042 Chronic combined systolic (congestive) and diastolic (congestive) heart failure: Secondary | ICD-10-CM

## 2011-05-05 DIAGNOSIS — D126 Benign neoplasm of colon, unspecified: Secondary | ICD-10-CM

## 2011-05-05 DIAGNOSIS — J986 Disorders of diaphragm: Secondary | ICD-10-CM

## 2011-05-05 DIAGNOSIS — K5730 Diverticulosis of large intestine without perforation or abscess without bleeding: Secondary | ICD-10-CM

## 2011-05-05 DIAGNOSIS — I38 Endocarditis, valve unspecified: Secondary | ICD-10-CM

## 2011-05-05 DIAGNOSIS — F411 Generalized anxiety disorder: Secondary | ICD-10-CM

## 2011-05-05 DIAGNOSIS — R0989 Other specified symptoms and signs involving the circulatory and respiratory systems: Secondary | ICD-10-CM

## 2011-05-05 DIAGNOSIS — D649 Anemia, unspecified: Secondary | ICD-10-CM

## 2011-05-05 LAB — HEPATIC FUNCTION PANEL
ALT: 13 U/L (ref 0–35)
Alkaline Phosphatase: 65 U/L (ref 39–117)
Bilirubin, Direct: 0.1 mg/dL (ref 0.0–0.3)
Total Bilirubin: 0.9 mg/dL (ref 0.3–1.2)

## 2011-05-05 LAB — CBC WITH DIFFERENTIAL/PLATELET
Basophils Absolute: 0 10*3/uL (ref 0.0–0.1)
Basophils Relative: 0.2 % (ref 0.0–3.0)
Eosinophils Absolute: 0.4 10*3/uL (ref 0.0–0.7)
MCHC: 32.8 g/dL (ref 30.0–36.0)
MCV: 86.6 fl (ref 78.0–100.0)
Monocytes Absolute: 0.9 10*3/uL (ref 0.1–1.0)
Neutro Abs: 7.5 10*3/uL (ref 1.4–7.7)
Neutrophils Relative %: 73.6 % (ref 43.0–77.0)
RBC: 4.34 Mil/uL (ref 3.87–5.11)
RDW: 14.6 % (ref 11.5–14.6)

## 2011-05-05 LAB — BRAIN NATRIURETIC PEPTIDE: Pro B Natriuretic peptide (BNP): 368 pg/mL — ABNORMAL HIGH (ref 0.0–100.0)

## 2011-05-05 LAB — BASIC METABOLIC PANEL
CO2: 34 mEq/L — ABNORMAL HIGH (ref 19–32)
Chloride: 98 mEq/L (ref 96–112)
Creatinine, Ser: 0.8 mg/dL (ref 0.4–1.2)
Potassium: 3.5 mEq/L (ref 3.5–5.1)
Sodium: 143 mEq/L (ref 135–145)

## 2011-05-05 NOTE — Progress Notes (Signed)
Subjective:     Patient ID: Katie Galvan, female   DOB: 04-20-1927, 78 y.o.   MRN: [id number]  HPI Review of Systems   Physical Exam       Subjective:    Patient ID: Katie Galvan, female    DOB: 06-Sep-1927, 78 y.o.   MRN: [id number]  HPI: 25 y/o WF, mother of Haeven Nickle, who moved here from [place] in 2011...  she has multiple medical problems including chronically elev right hemidiaph;  HBP;  Diastolic dysfunction & mild PulmHTN on 2DEcho;  VI & Edema;  Hx Dyspagia & gastritis;  Divertics & ?colon polyp;  Hx UTI & renal failure from dehydration 3/12;  Severe DJD- s/p R.THR/ ?CPPD/ LBP/ Chr Pain Syndrome on MS Contin;  Osteopenia;  Anemia...  ~  March 24, 2010:  She was hosp 3/11 - 03/17/10 w/ UTI, dehydration & renal insuffic> cults were neg & PCT=0.22, but active sediment & given IV Rocephin in hosp;  BUN 118 =>15 & Creat 2.7 =>0.72 w/ hydration but BNP incr to 636;  Hg 10.0 =>9.1 & Fe=11 (7%sat), B12=478;  EGD by DrDBrodie showed HH & gastritis w/ neg HPylori, Rx w/ Prilosec & Carafate transiently;  Currently she feels better & looks great> right hip surg postponed & anxious to resched due to her severe pain (on MS 30+15 Bid & still in pain, wants to add back Mobic-OK);  We wanted to check labs today but they couldn't get blood ==> reviewed all her meds, add FeSO4/ VitC, add Mobic Prn, OK for surg (check labs in hosp preop)...    HBP>  On ASA & Diltiazem 240mg  bid;  BP= 122/66 today & she denies CP, palpit, ch in SOB;  She does have incr edema since hydrated in hosp & BNP was up to 636 by disch (we discussed no salt, contin Lasix40mg /d);      Cardiac>  EKG essent WNL & 2DEcho showed mild LVH, norm sys funct w/ EF=55-65%, gr1 DD, mild MR w/ calcif annulus, Ao sclerosis w/ triv AI, mod PulmHTN w/ PAsys=46.  ~  June 30, 2010:  27mo ROV & post hosp visit>  She was hosp x2 since I saw her last >>    Hosp 4/6 - 04/18/10 by DrBlackman for right THR & required a revision of the  acetabular component 4d after the initial surg...     Hosp 6/2 - 06/13/10 w/ hypoxemic acute resp failure precipitated by fluid overload & diastolic CHF (BNP=2000) in assoc w/ her chr elev right hemidiaph, RLL atx/ scarring, & scoliosis w/ multilevel degen disc dis(w/ resultant restrictive lung disease); she was also Anemic (Hg= 9-10) w/ low Fe (=10, 6%sat); and also remains on large dose narcotic analgesics for her chr pain syndrome>  Disch back to [facility] on meds as reconciled below...     She is currently getting PT & walking w/ a walker, going about 200 ft w/ her nasal O2 at 2L/min w/ some DOE but she has been very sedentary for a long time;  She denies CP, palpit, syncope, but has some mild edema on Lasix 40mg /d; We rechecked her O2 sat= 83% RA at rest & she will need to stay on the O2 for now;  Her narcotic pain med hasn't been weaned any further since her hip surg in April> on MSContin 30+15 Bid + Percocet 5mg  prn...    LABS today>  CXR stable (elev right hemidiaph, atx, NAD);  Chems normal (Creat 0.8, BNP 95);  CBC w/  anemia (Hg 9.1, MCV 77, Fe 15), she has B pos blood> NOTE she had EGD 3/12 showing HH, gastritis, neg HPylori...    We decided to keep Lasix 40mg /d, restrict sodium etc;  Increase PPI to Bid, incr Fe w/ VitC Bid, check stool for occult blood, consider IV Fe if not improving orally & consider further GI eval...  ~  July 21, 2010:  3wk ROV & last visit we decided to keep LASIX 40mg /d; try to cut the MSContin from 45mg Bid to 45mg AM & 30mg PM, incr the Fe to Bid (w/ VitC), and incr the Prilosec 20mg Bid...  She reports stable & is now back home w/ family (out of skilled care & not yet ready to be on her own in [place])> they have weaned the MSContin down to 30mg AM & 30mg PM & doing satis (no Oxycodone needed);  Labs shows improved Hg= 10.0 now;  O2 sats improved> 94% RA at rest & decr to 93% on RA walking in the room;  Her edema is gone & BUN=25, Creat=0.8;  Getting home PT 3d per week;  Also BS  improved to the 120-130 range...    We decided to continue current meds but decr the Diltiazem to 240mg  once daily (due to BP sl low at 102/54); and they will continue to slowly wean the MSContin...  ~  November 02, 2010:  Post Hosp visit> she was hosp 10/7 - 10/19/10 w/ abd pain/ N/ V/ D & found to have ?colitis ?presumed infectious & resolved w/ Cipro/ Flagyl; also had CP & marked incr enzymes w/ +MB & Troponin=21; EKG reported to show diffuse STseg elev (no tracings in EChart to review); no rub on exam, felt to have myopericarditis; 2DEcho showed LV cavity dilated w/ septal apical anterior HK, & incr wall thickness c/w modLVH & mild conc hypertrophy; mildMR, normal RV;  She also developed fluid overload (off her diuretic & receiving IVF w/ the colitis) w/ BNP up to 10,000 & then diuresed w/ improvement; her prev meds were changed> Diltiazem & Vasotec stopped, given Lisinopril 2.5mg , & Lasix decr to 20mg /d...    Since disch she has retained fluid w/ 3+edema in LEs & gained>5lbs on the Lasix20 in the [place];  BP= 116/68;  We rechecked studies today:  CXR shows marked elev right hemidiaph & appears similar to her baseline film 6/12;  BNP=483;  Chems= normal;  Hg=11.0, WBC=8.2, TSH=5.77 but was norm prev (?euthyroid "sick" syndrome);  We discussed incr LASIX to 40mg /d, elevate legs, ?try support hose, & f/u in 4-6 weeks... She has appt w/ Cards tomorrow.  ~  December 07, 2010:  6wk ROV & she has seen DrBensimhon at the CHF clinic in the interim (3 weekly visits w/ the PAs)> he endorsed her Lasix at 40mg /d and added Spironolactone 12.5mg /d as well (stopping the KCl); she is improved & wt is down 14# from last visit to 139# today;  He increased her Coreg to 6.25Bid & plans short term follow up w/ repeat 2DEcho...  She tells me she wants to stop the Oxygen & use it only at night> we ambulated her in the halls today on RA- started out 90% RA at rest (HR70), desat to 86% after 2 laps (HR96 in bigeminy); she is asked to  continue the O2 w/ exercise & Qhs, ok to take it off at rest when not active...  She is still on the MSContin at 30mg Bid & not requiring any of the OxyIR> we discussed pushing her down to 30-15, the 15Bid  if poss over the next interval (meds regulated by her son [name])...  Labs today look great w/ K=3.6, TCO2=33, BUN=21, Creat=0.9, BNP=119 (her best yet).  ~  February 21, 2011:  74mo ROV & Naleyah is stable> she saw DrBensimhon 12/12 & doing well on Lasix40, Aldactone12.5, & Coreg 6.25Bid; he repeated her 2DEcho & her LVF had recovered to 55-60% (LV cavity mildly dil, mild LVH, mild MR, PAsys=69mmHg); he felt her myopericarditis had resolved & he released her from CHF clinic follow up...    Hx Resp Fail> chr elev right hemidiaph, on O2 w/ exerc & Qhs, Advair100 Bid & Spiriva daily; stable & rec to continue same plus incr exerc as able...    HBP> controlled on the Coreg, diuretics, & Lisinopril 2.5mg /d (off prev CCB); BP= 148/88 & she is reminded to elim salt, etc; wt is up 3# to 142# & we will monitor...    Chol> with the mult hospitalizations & rehab stays w/ numerous med reconciliations done- she has stopped her prev Mevacor20; we discussed checking f/u FLP off the statin in the future...    ORTHO> she has persistent discomfort in her hip, right shoulder, back & the chr pain syndrome currently on MSContin 30mg AM & 15mg PM; we discussed trial of decr to 15mg Bid betw now 7 f/u in 3 months...    GYN> she mentioned some vag spotting & apparently has a pessary in place since her last check in [place]; we will refer to GYN for eval...  ~  May2, 2013:  5mo ROV & add-on for recent incr SOB; she reports fall recently w/ feet swelling now & noted incr SOB; she is on her Oxygen at 1-2L/min, and had f/u Cards 3/13 for her sys & diast heart failure but they kept her diuretics the same> on Lasix 40mg /d but she is instructed to incr to 2/d if she has extra fluid/ edema/ etc...    She also had GYN appt DrFernandez 2/13 for  post menopausal bleeding, pessary for uterine prolapse x yrs (it was changed regularly in [place] but not in >44yr); DrF said her vag walls & Cx were irritated & friable, he removed pessary & Rx w/ estrace cream, she was to ret in 10d but didn't go (pessary is still out & she has not experienced any descensus so far); she will sched GYN follow up at her convenience... CXR 5/13 showed marked eventration of right hemidiaph (no change), mild cardiomeg, atherosclerotic calcif of tortuous Ao, sl congestion & Atx, etc... LABS 5/13:  Chems- ok w/ BS=125 BUN=14 Creat=0.8;  CBC- ok w/ Hg=12.3;  TSH=1.71;  BNP= 368...   HOSPITALIZATIONS: ~  [facility] 11/3 - 11/08/09 after fall at home w/ comminuted left wrist fx & had surg by DrKuzma ~  [facility] 3/11 - 03/17/10 w/ UTI, dehydration & renal insuffic> back to norm w/ hydration; also anemic, Fe defic & GI eval DrDBrodie w/ HH/ gastritis ~  [facility] - 04/18/10 by DrBlackman for right THR & required a revision of the acetabular component 4d after the initial surg... ~  [facility] 6/2 - 06/13/10 w/ hypoxemic acute resp failure precipitated by fluid overload & diastolic CHF (BNP=2000) in assoc w/ her chr elev right hemidiaph, RLL atx/ scarring, & scoliosis w/ multilevel degen disc dis(w/ resultant restrictive lung disease)... ~  [facility] 10/7 - 10/19/10 w/ abd pain/ N/ V/ D & found to have ?colitis ?presumed infectious & resolved w/ Cipro/ Flagyl; also had CP & abn EKG/ Enz felt to be a myopericarditis.Marland KitchenMarland Kitchen  Problem List:    ACUTE ON CHRONIC RESP INSUFFICIENCY>  Precipitated 6/12 by diastolic CHF superimposed on her chronic resp problems etc... Now on ADVAIR100 Bid, SPIRIVA daily, O2 2L/ min... DIAPHRAGMATIC DISORDER (ICD-519.4) - she has a chronically elev right hemidiaph, apparently idiopathic; and she is mostly asymptomatic w/o cough, phlegm, hemoptysis, ch in SOB, etc... ~  11/11:  CXR showed calcif Ao, elev right hemidiaph, mild scarring at bases, NAD... ~  6/12:  Presented to ER w/  hypoxemic resp failure due to vol overload assoc w/ her restrictive lung dis & chr pain syndrome requiring narcotic analgesics... ~  7/12:  O2 sats improved & OK to cut back the Oxygen to prn... ~  10/12:  Hosp w/ ?colitis and ?myopericarditis> developed fluid retention, CHF, abn EKG/ Enz & 2DEcho> seen by [facility] ==> now followed in the CHF clinic/ DrBensimhon & back on Oxygen regularly ==> weaned to exerc & Qhs.  Hx HYPERTENSION (ICD-401.9) VALVULAR HEART DISEASE (ICD-424.90) > prev trial AI, mild MR... Hx of CARDIAC ARRHYTHMIA (ICD-427.9)  EPISODE of MYOPERICARDITIS w/ decr LVF> see 10/12 Hosp... ~  she was on ASA81, Diltiazem240, Vasotec20, & LASIX 40mg /d; but meds changed during the 10/12 Hosp to ASA 81mg /d, LISINOPRIL 2.5mg /d, LASIX 20mg /d ==> freq med adjustments/ titrations from DrBensimhon CHF clinic... ~  11/11 & 6/12:  EKG showed NSR, NSSTTWA, NAD... ~  2DEcho 6/12 showed mild LVH, norm LVF w/ EF=55-60%, Gr 1 DD, trivial AI, heavily calcif mitral annulus & mild MR, PAsys est . ~  10/12:  SEE ABOVE + EChart records... Hosp w/ ?myopericarditis, elev enz, abn 2DEcho & meds adjusted> now followed freq via the CHF clinic/ DrBensimhon w/ freq med adjustments... ~  12/12:  Now much improved on COREG 6.25Bid, LISINOPRIL 2.5mg /d, LASIX 40mg /d, SPIRONOLACTONE 12.5mg /d; wt down to 139# & BNP= 119... ~  Repeat 2DEcho 12/12 showed LVF recovered to 55-60% (LV cavity mildly dil, mild LVH, mild MR, PAsys=48mmHg). ~  5/13: presents w/ incr SOB & eval shows wt=149# & BNP= 368 despite Lasix40 & Aldactone12.5; BP is also elev at 178/88; rec to increase Lasix to 80 for a few days...   VENOUS INSUFFICIENCY (ICD-459.81) LEG EDEMA, CHRONIC (ICD-782.3) - she has severe venous stasis changes and chronic edema, thickened skin over LE's etc... she knows to be careful w/ hygiene, no salt, elevation, support hose, etc ~  8/12:  Stable on Lasix40, 1+chr edema, wt=137# ~  10/12:  Post hosp check on Lasix20,  3+edema, BNP=483, wt=152#; rec to incr Lasix to 40mg /d... ~  12/12:  meds adjusted as above via CHF clinic & now K=3.6, TCO2=33, BUN=21, Creat=0.9, BNP=119 (her best yet)...  HYPERCHOLESTEROLEMIA (ICD-272.0) - she has been on Mevacor 20mg /d from her [place] physician... ~  Perhaps we can try to get an FLP at [facility] since we cannot get her in for Fasting labs. ~  FLP during the 10/12 hosp showed TChol 146, TG 98, HDL 53, LDL 73 ~  Due to numerous [place], Rehab stays in [place], & mult med reconciliations- her Mevacor was dropped along the way; wants to leave it off 7 check FLP on diet alone.  OTHER DYSPHAGIA (ICD-787.29) - she is on PRILOSEC 20mg /d & reports occas dysphagia but denies choking etc... she apparently has never had an Endoscopy or GI eval for this problem, "I have to be careful when I eat"... ~  6/12:  NOTE she had EGD 3/12 showing HH, gastritis, neg HPylori> rec increase PPI to Bid. ~  10/12:  She  was discharge off her PPI therapy ==> she denies reflux symptoms.  DIVERTICULOSIS OF COLON (ICD-562.10) Hx of COLONIC POLYPS (ICD-211.3) - she tells me that prev colonoscopy in [place] showed divertics & ?polyp but she does not recall any details...  she denies much constipation despite the narcotic analgesics- uses prunes as needed. ~  [facility] 10/12 w/ ?infectious colitis> resolved after Cipro/ Flagyl Rx ==> uses Miralax prn due to narcotics.  DEGENERATIVE JOINT DISEASE (ICD-715.90) - she has severe osteoarthritis w/ XRays 11/11 showing severe osteopenia, severe right hip degeneration & relative sparing of the left hip joint;  DJD knees w/ ?CPPD, abn patella;  left wrist fx- radial  head & ulnar styloid... she had surg left wrist by DrKKuzma... ~  4/12:  DrBlackman did right THR but required revision of acetabular component 4d later> went to [facility] for rehab... ~  DrBlackman continues to follow w/ shots as needed for bursitis she says... ~  10/12:  She is c/o right shoulder pain & decr  ROM, she wonders if this might be the cause of her chest discomfort; she will f/u w/ Ortho for XRays & shot...  GYN >>  ~  2/13: pt mentioned some spotting & apparently has a pessary in place since her days in [place]; she is in need of GYN eval & check up & we will refer... ~  2/13: she saw DrFernandez for GYN> said her vag walls & Cx were irritated & friable, he removed pessary & Rx w/ estrace cream, she was to ret in 10d but didn't go (pessary is still out & she has not experienced any descensus so far); she will sched GYN follow up at her convenience...  Hx of BACK PAIN, LUMBAR (ICD-724.2) CHRONIC PAIN SYNDROME (ICD-338.4) - she has chronic pain- mostly from her arthritic condition- and was started on MSContin by her LMD in [place] in the summer of 2011> dose slowly increased & she was on 90mg  Bid (taking a 60mg  tab + 30mg  tab Bid)... she does not believe that this med has anything to do w/ her fall & wrist fx, or her complaints of decr memory etc... we discussed the need to wean off this medication & try to deal w/ her arthritis pain thru an Orthopedist or poss a Pain Clinic... ~  11/11:  she notes pain worse during the day while up & about> try to decr MSContin to 90mg AM & 60mg PM... ~  1/12 & 4/12:  she is down to 60mg Bid & we discussed weaning further ==> she was down to 45mg  (30+15) Bid by the time of her right THR... ~  6/12:  Recommend that she try to wean further> try 45am & 30pm... ~  7/12:  She's down to 30mg  Bid & will wean further as able... ~  10/12:  She remains on the MSContin at 30mg Bid... ~  12/12:  We discussed further slow wean down to 30-15 first... ~  2/13:  We discussed trying to wean down to 15mg  Bid over the next few months, but she was unable to do so; states that drBlackman feels it's due to her back...  OSTEOPENIA (ICD-733.90) - XRays here showed severe osteopenia... she notes that she used to be on Fosamax but ?how long? & when stopped?... she states that she was told  her prev BMD was "good"... she has not been taking calcium supplements, but was prev on some OTC Vit D supplements... asked to resume Calcium, Women's MVI, Vit D 1000u daily...  Hx of DEPRESSION (  ICD-311) - on CELEXA 20mg /d and she wants to continue this med...  ANEMIA, MILD (ICD-285.9) - Hg post op 11/11 wrist fx surg = 11.2 ~  6/12:  Labs showed Hg= 9.1, MCV= 77, Fe= 15 (7%sat), & she has B pos blood> we decided to incr Fe to Bid (may need IV Fe if not responding). ~  7/12:  Labs showed Hg= 10.0, MCV= 76, she reports stool checks at [facility] were NEG... ~  8/12: she reports that she had f/u Heme eval DrMohammed & "he released me" on Fe + VitC daily... ~  10/12:  Labs post hosp showed Hg= 11.0, MCV= 87 ~  Labs 5/13 showed Hg= 12.3   Past Surgical History  Procedure Date  . Appendectomy   . Breast surgery   . Right wrist surgery   . Bilateral cataracts   . Hip surgery   . Tonsillectomy   . Adenoidectomy     Outpatient Encounter Prescriptions as of 05/05/2011  Medication Sig Dispense Refill  . aspirin 81 MG EC tablet Take 81 mg by mouth daily.        . Calcium Carbonate-Vit D-Min (CALTRATE 600+D PLUS) 600-400 MG-UNIT per tablet Chew 1 tablet by mouth 2 (two) times daily.       . carvedilol (COREG) 6.25 MG tablet Take 1 tablet (6.25 mg total) by mouth 2 (two) times daily with a meal.  60 tablet  6  . Cholecalciferol (VITAMIN D-1000 MAX ST) 1000 UNITS tablet Take 2,000 Units by mouth every evening.       . citalopram (CELEXA) 20 MG tablet Take 1 tablet (20 mg total) by mouth daily.  30 tablet  6  . clotrimazole-betamethasone (LOTRISONE) cream Apply topically 2 (two) times daily.  30 g  0  . estradiol (ESTRACE VAGINAL) 0.1 MG/GM vaginal cream Place 0.25 Applicatorfuls vaginally daily. Apply intravaginally for ten nights then two times a week thereafter  42.5 g  12  . ferrous sulfate 325 (65 FE) MG tablet Take 325 mg by mouth 2 (two) times daily.        . Fluticasone-Salmeterol (ADVAIR  DISKUS) 100-50 MCG/DOSE AEPB Inhale 1 puff into the lungs every 12 (twelve) hours.  60 each  11  . furosemide (LASIX) 40 MG tablet Take 1 tablet (40 mg total) by mouth daily.  30 tablet  1  . lisinopril (PRINIVIL,ZESTRIL) 2.5 MG tablet Take 1 tablet (2.5 mg total) by mouth daily.  30 tablet  11  . loratadine (CLARITIN) 10 MG tablet Take 10 mg by mouth daily.        Marland Kitchen lovastatin (MEVACOR) 20 MG tablet Take 1 tablet (20 mg total) by mouth at bedtime.  30 tablet  11  . morphine (MS CONTIN) 15 MG 12 hr tablet Take as directed  60 tablet  0  . morphine (MS CONTIN) 30 MG 12 hr tablet Take 1 tablet (30 mg total) by mouth 2 (two) times daily. Take as directed  60 tablet  0  . Multiple Vitamins-Minerals (CERTA-VITE PO) Take 1 tablet by mouth daily.        Marland Kitchen oxyCODONE (OXY IR/ROXICODONE) 5 MG immediate release tablet Take 5 mg by mouth every 4 (four) hours as needed. As needed for pain      . polyethylene glycol (MIRALAX / GLYCOLAX) packet Take 17 g by mouth daily as needed.       Marland Kitchen spironolactone (ALDACTONE) 25 MG tablet Take 1/2 tablet daily  30 tablet  6  . tiotropium (SPIRIVA)  18 MCG inhalation capsule Place 1 capsule (18 mcg total) into inhaler and inhale daily.  30 capsule  11  . vitamin C (ASCORBIC ACID) 500 MG tablet Take 500 mg by mouth daily.        Marland Kitchen zolpidem (AMBIEN) 10 MG tablet Take 1 tablet (10 mg total) by mouth at bedtime as needed for sleep.  30 tablet  5  . DISCONTD: zolpidem (AMBIEN) 10 MG tablet 1 at bedtime as needed        Allergies  Allergen Reactions  . Sulfonamide Derivatives Swelling    Current Medications, Allergies, Past Medical History, Past Surgical History, Family History, and Social History were reviewed in [employer] record.    Review of Systems: Constitutional:  Denies F/C/S, anorexia, unexpected weight change. HEENT:  No HA, visual changes, earache, nasal symptoms, sore throat, hoarseness. Resp:  No cough, sputum, hemoptysis; mild SOB/ DOE  noted... Cardio:  No CP, palpit, orthopnea;  +edema & DOE but limited mobility. GI:  Denies N/V/D, tends toward constip due to narcotics; swallowing OK & denies reflux or abd pain.  GU:  No dysuria, freq, urgency, hematuria, or flank pain. MS:  Severe DJD esp right hip w/ pain & decr ROM ==> s/p right THR now... Neuro:  No tremors, seizures, dizziness, syncope; +weakness & multifactorial gait abn. Skin:  No suspicious lesions or skin rash. Heme:  No adenopathy, bruising, bleeding. Psyche: Denies confusion, sleep disturbance, hallucinations; +anxiety & situational depression.    Objective:  Physical Exam:  WD, WN, 78 y/o WF chr ill appearing but in NAD... Vital Signs:  Reviewed... General:  Alert & oriented; pleasant & cooperative... HEENT:  [place]/AT, EOM-wnl, PERRLA, EACs-clear, TMs-wnl, NOSE-clear, THROAT-clear & wnl. Neck:  Supple w/ decr ROM; no JVD; normal carotid impulses w/o bruits; no thyromegaly or nodules palpated; no lymphadenopathy. Chest:  Clear to P & A; without wheezes/ rales/ or rhonchi heard... Heart:  Regular Rhythm; norm S1 & S2 without murmurs/ rubs/ or gallops detected... Abdomen:  Soft & nontender; normal bowel sounds; no organomegaly or masses palpated... Ext:  decrROM; +deformities & mod arthritic changes; no varicose veins, +venous insuffic & 1+edema;  Pulses intact w/o bruits... s/p right THR w/ revision; ambulates w/ walker... Neuro:  CNs intact;  No focal neuro deficits, in wheelchair & painful standing & walking... Derm:  No lesions noted; no rash etc... Lymph:  No cervical, supraclavicular, axillary, or inguinal adenopathy palpated...   RADIOLOGY DATA:  Reviewed in the EPIC EMR & discussed w/ the patient...  LABORATORY DATA:  Reviewed in the EPIC EMR & discussed w/ the patient...   Assessment & Plan:   Acute on Chr Resp Failure>  Multifactorial w/ diastolic CHF, elev right hemidiaph, atx, scoliosis, restrictive lung dis, narcotic pain meds- all playing a  role... O2 sats are now improved & she can decr the oxygen to as needed at rest & 2L/min w/ exercise...  Elev right hemidiaph>  This is chronic & assoc w/ some mild basilar atx; encouraged to get deep breaths & expand well...  HBP, etc>  BP is back up w/ her 10# wt gain & worsening CHF; we are increasing her diuretic & plan close f/u...  Hx of Myopericarditis w/ abn EKG/ Enz & subseq vol overload w/ abn 2DEcho>  She improved back on her diuretic & meds were adjusted in the CHF clinic; repeat 2DEcho 12/12 w/ recovery of LVF & back to her baseline...  Diastolic CHF>  BNP was much improved ==> now up  10# & BNP 368; rec to incr diuretics...  VI, Edema>  Discussed Lasix40==>80, no salt, elevate legs, TED hose, etc...  CHOL>  ?Mevacor on her list, but not currently taking, we will try to sort this out & f/u FLP later...  GERD, Dysphagia>  She had EGD during prev hosp w/ HH, gastritis & neg HPylori;  Prev on PPI Rx but off now & she feels she is OK, we will follow.  Hx of Colitis- presumed infectious>  Resolved w/ Cipro/ Flagyl; off meds and back to baseline GI status she feels...  Divertics, Constip>  Related to her narcotic analgesics; improved w/ laxatives OTC...  DJD, s/p right THR, Chr Pain Syndrome> Trying to wean MSContin further which should be poss based on the fact that the painful right hip has been replaced...  Other medical problems as noted...    GYN> pt c/o spotting & has pessary in place since her last check in [place]; we will help her get a gyn appt ASAP for eval & check up...    Derm> she has a seborrheic dermatitis rash & we will Rx w/ Lotrisone cream...   NOTE>> she is rec to incr Lasix to 80mg /d x3d, then change to 60mg /d going forward til next OV recheck.Marland KitchenMarland Kitchen

## 2011-05-05 NOTE — Patient Instructions (Signed)
Today we updated your med list in our EPIC system...    Continue your current medications the same except we want to increase your diuretic slightly for the next few days>    Take another 40mg  Lasix today when you get home; and increase the Lasix for the next 2d only to 80mg  each AM (80mg = 2 tabs on Sat & Sun AM)...  Today we did a follow up CXR to look for extra fluid on your lungs, and we check your blood work as well...    We will call you w/ the results of your tests in a few days...  Call for any questions...  Let's keep your prev sched f/u appt on 05/23/11.Marland KitchenMarland Kitchen

## 2011-05-07 ENCOUNTER — Encounter: Payer: Self-pay | Admitting: Pulmonary Disease

## 2011-05-09 ENCOUNTER — Telehealth: Payer: Self-pay | Admitting: Pulmonary Disease

## 2011-05-09 NOTE — Telephone Encounter (Signed)
Called and spoke with kelly from [facility]---she stated that the facility that the pt lives at uses another DME company for home care and the orders that were sent to [facility] will not be fulfilled per kelly since they use another facility.  Pt is still getting home care through this other facility.

## 2011-05-16 ENCOUNTER — Telehealth: Payer: Self-pay | Admitting: Pulmonary Disease

## 2011-05-16 MED ORDER — MORPHINE SULFATE ER 30 MG PO TBCR: 30.0000 mg | EXTENDED_RELEASE_TABLET | Freq: Two times a day (BID) | ORAL | Status: DC

## 2011-05-16 NOTE — Telephone Encounter (Signed)
rx has been printed and placed on SN cart.

## 2011-05-16 NOTE — Telephone Encounter (Signed)
rx has been signed by SN and i called mike and he is aware of rx up front and ready to be picked up.

## 2011-05-16 NOTE — Telephone Encounter (Signed)
Called, spoke with [name] who is requesting rx for pt for Morphine 30 mg bid.  He would like to pick up when ready. This rx was last given on 03/01/11 # 60 x 0.   Last OV with [name] 05/05/11  [name], pls advise if ok to give rx.  Thank you.

## 2011-05-23 ENCOUNTER — Encounter: Payer: Self-pay | Admitting: Pulmonary Disease

## 2011-05-23 ENCOUNTER — Ambulatory Visit (INDEPENDENT_AMBULATORY_CARE_PROVIDER_SITE_OTHER): Payer: Medicare Other | Admitting: Pulmonary Disease

## 2011-05-23 VITALS — BP 160/80 | HR 64 | Temp 99.3°F | Ht 64.0 in | Wt 153.8 lb

## 2011-05-23 DIAGNOSIS — M899 Disorder of bone, unspecified: Secondary | ICD-10-CM

## 2011-05-23 DIAGNOSIS — M545 Low back pain, unspecified: Secondary | ICD-10-CM

## 2011-05-23 DIAGNOSIS — M949 Disorder of cartilage, unspecified: Secondary | ICD-10-CM

## 2011-05-23 DIAGNOSIS — G894 Chronic pain syndrome: Secondary | ICD-10-CM

## 2011-05-23 DIAGNOSIS — R609 Edema, unspecified: Secondary | ICD-10-CM

## 2011-05-23 DIAGNOSIS — I38 Endocarditis, valve unspecified: Secondary | ICD-10-CM

## 2011-05-23 DIAGNOSIS — M1990 Unspecified osteoarthritis, unspecified site: Secondary | ICD-10-CM

## 2011-05-23 DIAGNOSIS — I872 Venous insufficiency (chronic) (peripheral): Secondary | ICD-10-CM

## 2011-05-23 DIAGNOSIS — J9620 Acute and chronic respiratory failure, unspecified whether with hypoxia or hypercapnia: Secondary | ICD-10-CM

## 2011-05-23 DIAGNOSIS — I5042 Chronic combined systolic (congestive) and diastolic (congestive) heart failure: Secondary | ICD-10-CM

## 2011-05-23 DIAGNOSIS — I10 Essential (primary) hypertension: Secondary | ICD-10-CM

## 2011-05-23 MED ORDER — ZOLPIDEM TARTRATE 10 MG PO TABS: 10.0000 mg | ORAL_TABLET | Freq: Every evening | ORAL | Status: DC | PRN

## 2011-05-23 NOTE — Progress Notes (Signed)
Subjective:     Patient ID: Katie Galvan, female   DOB: 01-Jun-1927, 78 y.o.   MRN: [id number]  HPI  Review of Systems    Physical Exam        Subjective:    Patient ID: Katie Galvan, female    DOB: 21-Dec-1927, 78 y.o.   MRN: [id number]  HPI: 69 y/o WF, mother of Maydelin Deming, who moved here from [place] in 2011...  she has multiple medical problems including chronically elev right hemidiaph;  HBP;  Diastolic dysfunction & mild PulmHTN on 2DEcho;  VI & Edema;  Hx Dyspagia & gastritis;  Divertics & ?colon polyp;  Hx UTI & renal failure from dehydration 3/12;  Severe DJD- s/p R.THR/ ?CPPD/ LBP/ Chr Pain Syndrome on MS Contin;  Osteopenia;  Anemia...  ~  March 24, 2010:  She was hosp 3/11 - 03/17/10 w/ UTI, dehydration & renal insuffic> cults were neg & PCT=0.22, but active sediment & given IV Rocephin in hosp;  BUN 118 =>15 & Creat 2.7 =>0.72 w/ hydration but BNP incr to 636;  Hg 10.0 =>9.1 & Fe=11 (7%sat), B12=478;  EGD by DrDBrodie showed HH & gastritis w/ neg HPylori, Rx w/ Prilosec & Carafate transiently;  Currently she feels better & looks great> right hip surg postponed & anxious to resched due to her severe pain (on MS 30+15 Bid & still in pain, wants to add back Mobic-OK);  We wanted to check labs today but they couldn't get blood ==> reviewed all her meds, add FeSO4/ VitC, add Mobic Prn, OK for surg (check labs in hosp preop)...    HBP>  On ASA & Diltiazem 240mg  bid;  BP= 122/66 today & she denies CP, palpit, ch in SOB;  She does have incr edema since hydrated in hosp & BNP was up to 636 by disch (we discussed no salt, contin Lasix40mg /d);      Cardiac>  EKG essent WNL & 2DEcho showed mild LVH, norm sys funct w/ EF=55-65%, gr1 DD, mild MR w/ calcif annulus, Ao sclerosis w/ triv AI, mod PulmHTN w/ PAsys=46.  ~  June 30, 2010:  56mo ROV & post hosp visit>  She was hosp x2 since I saw her last >>    Hosp 4/6 - 04/18/10 by DrBlackman for right THR & required a revision  of the acetabular component 4d after the initial surg...     Hosp 6/2 - 06/13/10 w/ hypoxemic acute resp failure precipitated by fluid overload & diastolic CHF (BNP=2000) in assoc w/ her chr elev right hemidiaph, RLL atx/ scarring, & scoliosis w/ multilevel degen disc dis(w/ resultant restrictive lung disease); she was also Anemic (Hg= 9-10) w/ low Fe (=10, 6%sat); and also remains on large dose narcotic analgesics for her chr pain syndrome>  Disch back to [place] [facility] on meds as reconciled below...     She is currently getting PT & walking w/ a walker, going about 200 ft w/ her nasal O2 at 2L/min w/ some DOE but she has been very sedentary for a long time;  She denies CP, palpit, syncope, but has some mild edema on Lasix 40mg /d; We rechecked her O2 sat= 83% RA at rest & she will need to stay on the O2 for now;  Her narcotic pain med hasn't been weaned any further since her hip surg in April> on MSContin 30+15 Bid + Percocet 5mg  prn...    LABS today>  CXR stable (elev right hemidiaph, atx, NAD);  Chems normal (Creat 0.8, BNP 95);  CBC w/ anemia (Hg 9.1, MCV 77, Fe 15), she has B pos blood> NOTE she had EGD 3/12 showing HH, gastritis, neg HPylori...    We decided to keep Lasix 40mg /d, restrict sodium etc;  Increase PPI to Bid, incr Fe w/ VitC Bid, check stool for occult blood, consider IV Fe if not improving orally & consider further GI eval...  ~  July 21, 2010:  3wk ROV & last visit we decided to keep LASIX 40mg /d; try to cut the MSContin from 45mg Bid to 45mg AM & 30mg PM, incr the Fe to Bid (w/ VitC), and incr the Prilosec 20mg Bid...  She reports stable & is now back home w/ family (out of skilled care & not yet ready to be on her own in [place])> they have weaned the MSContin down to 30mg AM & 30mg PM & doing satis (no Oxycodone needed);  Labs shows improved Hg= 10.0 now;  O2 sats improved> 94% RA at rest & decr to 93% on RA walking in the room;  Her edema is gone & BUN=25, Creat=0.8;  Getting home PT 3d per week;   Also BS improved to the 120-130 range...    We decided to continue current meds but decr the Diltiazem to 240mg  once daily (due to BP sl low at 102/54); and they will continue to slowly wean the MSContin...  ~  November 02, 2010:  Post Hosp visit> she was hosp 10/7 - 10/19/10 w/ abd pain/ N/ V/ D & found to have ?colitis ?presumed infectious & resolved w/ Cipro/ Flagyl; also had CP & marked incr enzymes w/ +MB & Troponin=21; EKG reported to show diffuse STseg elev (no tracings in EChart to review); no rub on exam, felt to have myopericarditis; 2DEcho showed LV cavity dilated w/ septal apical anterior HK, & incr wall thickness c/w modLVH & mild conc hypertrophy; mildMR, normal RV;  She also developed fluid overload (off her diuretic & receiving IVF w/ the colitis) w/ BNP up to 10,000 & then diuresed w/ improvement; her prev meds were changed> Diltiazem & Vasotec stopped, given Lisinopril 2.5mg , & Lasix decr to 20mg /d...    Since disch she has retained fluid w/ 3+edema in LEs & gained>5lbs on the Lasix20 in the NH;  BP= 116/68;  We rechecked studies today:  CXR shows marked elev right hemidiaph & appears similar to her baseline film 6/12;  BNP=483;  Chems= normal;  Hg=11.0, WBC=8.2, TSH=5.77 but was norm prev (?euthyroid "sick" syndrome);  We discussed incr LASIX to 40mg /d, elevate legs, ?try support hose, & f/u in 4-6 weeks... She has appt w/ Cards tomorrow.  ~  December 07, 2010:  6wk ROV & she has seen DrBensimhon at the CHF clinic in the interim (3 weekly visits w/ the PAs)> he endorsed her Lasix at 40mg /d and added Spironolactone 12.5mg /d as well (stopping the KCl); she is improved & wt is down 14# from last visit to 139# today;  He increased her Coreg to 6.25Bid & plans short term follow up w/ repeat 2DEcho...  She tells me she wants to stop the Oxygen & use it only at night> we ambulated her in the halls today on RA- started out 90% RA at rest (HR70), desat to 86% after 2 laps (HR96 in bigeminy); she is  asked to continue the O2 w/ exercise & Qhs, ok to take it off at rest when not active...  She is still on the MSContin at 30mg Bid & not requiring any of the OxyIR> we discussed pushing her down to 30-15,  the 15Bid if poss over the next interval (meds regulated by her son [name])...  Labs today look great w/ K=3.6, TCO2=33, BUN=21, Creat=0.9, BNP=119 (her best yet).  ~  February 21, 2011:  62mo ROV & Johnell is stable> she saw DrBensimhon 12/12 & doing well on Lasix40, Aldactone12.5, & Coreg 6.25Bid; he repeated her 2DEcho & her LVF had recovered to 55-60% (LV cavity mildly dil, mild LVH, mild MR, PAsys=85mmHg); he felt her myopericarditis had resolved & he released her from CHF clinic follow up...    Hx Resp Fail> chr elev right hemidiaph, on O2 w/ exerc & Qhs, Advair100 Bid & Spiriva daily; stable & rec to continue same plus incr exerc as able...    HBP> controlled on the Coreg, diuretics, & Lisinopril 2.5mg /d (off prev CCB); BP= 148/88 & she is reminded to elim salt, etc; wt is up 3# to 142# & we will monitor...    Chol> with the mult hospitalizations & rehab stays w/ numerous med reconciliations done- she has stopped her prev Mevacor20; we discussed checking f/u FLP off the statin in the future...    ORTHO> she has persistent discomfort in her hip, right shoulder, back & the chr pain syndrome currently on MSContin 30mg AM & 15mg PM; we discussed trial of decr to 15mg Bid betw now 7 f/u in 3 months...    GYN> she mentioned some vag spotting & apparently has a pessary in place since her last check in [place]; we will refer to GYN for eval...  ~  May 04, 2011:  22mo ROV & add-on for recent incr SOB; she reports fall recently w/ feet swelling now & noted incr SOB; she is on her Oxygen at 1-2L/min, and had f/u Cards 3/13 for her sys & diast heart failure but they kept her diuretics the same> on Lasix 40mg /d but she is instructed to incr to 2/d if she has extra fluid/ edema/ etc...    She also had GYN appt DrFernandez  2/13 for post menopausal bleeding, pessary for uterine prolapse x yrs (it was changed regularly in [place] but not in >58yr); DrF said her vag walls & Cx were irritated & friable, he removed pessary & Rx w/ estrace cream, she was to ret in 10d but didn't go (pessary is still out & she has not experienced any descensus so far); she will sched GYN follow up at her convenience... CXR 5/13 showed marked eventration of right hemidiaph (no change), mild cardiomeg, atherosclerotic calcif of tortuous Ao, sl congestion & Atx, etc... LABS 5/13:  Chems- ok w/ BS=125 BUN=14 Creat=0.8;  CBC- ok w/ Hg=12.3;  TSH=1.71;  BNP= 368...  ~  May 23, 2011:  3wk ROV & Aaliyah is improved- breathing better, less SOB, no chest discomfort, and ?swelling down; she notes better appetite & wt is actually up 4#; we discussed continuing the Lasix 40mg  each AM but she may incr to 80mg  on any day that swelling doesn't go down overnight; rec regular exercise etc & she is getting PT at [facility]...   HOSPITALIZATIONS: ~  [facility] 11/3 - 11/08/09 after fall at home w/ comminuted left wrist fx & had surg by DrKuzma ~  [facility] 3/11 - 03/17/10 w/ UTI, dehydration & renal insuffic> back to norm w/ hydration; also anemic, Fe defic & GI eval DrDBrodie w/ HH/ gastritis ~  [facility] - 04/18/10 by DrBlackman for right THR & required a revision of the acetabular component 4d after the initial surg... ~  [facility] 6/2 - 06/13/10 w/ hypoxemic  acute resp failure precipitated by fluid overload & diastolic CHF (BNP=2000) in assoc w/ her chr elev right hemidiaph, RLL atx/ scarring, & scoliosis w/ multilevel degen disc dis(w/ resultant restrictive lung disease)... ~  [facility] 10/7 - 10/19/10 w/ abd pain/ N/ V/ D & found to have ?colitis ?presumed infectious & resolved w/ Cipro/ Flagyl; also had CP & abn EKG/ Enz felt to be a myopericarditis...   Problem List:    ACUTE ON CHRONIC RESP INSUFFICIENCY>  Precipitated 6/12 by diastolic CHF superimposed on her chronic resp  problems etc... Now on ADVAIR100 Bid, SPIRIVA daily, O2 2L/ min... DIAPHRAGMATIC DISORDER (ICD-519.4) - she has a chronically elev right hemidiaph, apparently idiopathic; and she is mostly asymptomatic w/o cough, phlegm, hemoptysis, ch in SOB, etc... ~  11/11:  CXR showed calcif Ao, elev right hemidiaph, mild scarring at bases, NAD... ~  6/12:  Presented to ER w/ hypoxemic resp failure due to vol overload assoc w/ her restrictive lung dis & chr pain syndrome requiring narcotic analgesics... ~  7/12:  O2 sats improved & OK to cut back the Oxygen to prn... ~  10/12:  Hosp w/ ?colitis and ?myopericarditis> developed fluid retention, CHF, abn EKG/ Enz & 2DEcho> seen by [facility] ==> now followed in the CHF clinic/ DrBensimhon & back on Oxygen regularly ==> weaned to exerc & Qhs. ~  5/13:  She is feeling better & wonders if she needs the O2; offered to check ambulatory O2 here & poss ONO at [facility] but she wants to wait...  Hx HYPERTENSION (ICD-401.9) VALVULAR HEART DISEASE (ICD-424.90) > prev trial AI, mild MR... Hx of CARDIAC ARRHYTHMIA (ICD-427.9)  EPISODE of MYOPERICARDITIS w/ decr LVF> see 10/12 Hosp... ~  she was on ASA81, Diltiazem240, Vasotec20, & LASIX 40mg /d; but meds changed during the 10/12 Hosp to ASA 81mg /d, LISINOPRIL 2.5mg /d, LASIX 20mg /d ==> freq med adjustments/ titrations from DrBensimhon CHF clinic... ~  11/11 & 6/12:  EKG showed NSR, NSSTTWA, NAD... ~  2DEcho 6/12 showed mild LVH, norm LVF w/ EF=55-60%, Gr 1 DD, trivial AI, heavily calcif mitral annulus & mild MR, PAsys est . ~  10/12:  SEE ABOVE + EChart records... Hosp w/ ?myopericarditis, elev enz, abn 2DEcho & meds adjusted> now followed freq via the CHF clinic/ DrBensimhon w/ freq med adjustments... ~  12/12:  Now much improved on COREG 6.25Bid, LISINOPRIL 2.5mg /d, LASIX 40mg /d, SPIRONOLACTONE 12.5mg /d; wt down to 139# & BNP= 119... ~  Repeat 2DEcho 12/12 showed LVF recovered to 55-60% (LV cavity mildly dil, mild LVH,  mild MR, PAsys=63mmHg). ~  5/13: presents w/ incr SOB & eval shows wt=149# & BNP= 368 despite Lasix40 & Aldactone12.5; BP is also elev at 178/88; rec to increase Lasix to 80 for a few days ==> she reports improved and back to baseline...   VENOUS INSUFFICIENCY (ICD-459.81) LEG EDEMA, CHRONIC (ICD-782.3) - she has severe venous stasis changes and chronic edema, thickened skin over LE's etc... she knows to be careful w/ hygiene, no salt, elevation, support hose, etc ~  8/12:  Stable on Lasix40, 1+chr edema, wt=137# ~  10/12:  Post hosp check on Lasix20, 3+edema, BNP=483, wt=152#; rec to incr Lasix to 40mg /d... ~  12/12:  meds adjusted as above via CHF clinic & now K=3.6, TCO2=33, BUN=21, Creat=0.9, BNP=119 (her best yet)... ~  5/13:  We discussed adjusting Lasix to 40mg  daily & 80mg  prn for swelling that doesn't go down overnight...  HYPERCHOLESTEROLEMIA (ICD-272.0) - she has been on Mevacor 20mg /d from her [place] physician... ~  Perhaps we can try to get an FLP at [facility] since we cannot get her in for Fasting labs. ~  FLP during the 10/12 hosp showed TChol 146, TG 98, HDL 53, LDL 73 ~  Due to numerous [place], Rehab stays in [place], & mult med reconciliations- her Mevacor was dropped along the way; wants to leave it off & check FLP on diet alone.  OTHER DYSPHAGIA (ICD-787.29) - she is on PRILOSEC 20mg /d & reports occas dysphagia but denies choking etc... she apparently has never had an Endoscopy or GI eval for this problem, "I have to be careful when I eat"... ~  6/12:  NOTE she had EGD 3/12 showing HH, gastritis, neg HPylori> rec increase PPI to Bid. ~  10/12:  She was discharge off her PPI therapy ==> she denies reflux symptoms.  DIVERTICULOSIS OF COLON (ICD-562.10) Hx of COLONIC POLYPS (ICD-211.3) - she tells me that prev colonoscopy in [place] showed divertics & ?polyp but she does not recall any details...  she denies much constipation despite the narcotic analgesics- uses prunes as needed. ~   [facility] 10/12 w/ ?infectious colitis> resolved after Cipro/ Flagyl Rx ==> uses Miralax prn due to narcotics.  DEGENERATIVE JOINT DISEASE (ICD-715.90) - she has severe osteoarthritis w/ XRays 11/11 showing severe osteopenia, severe right hip degeneration & relative sparing of the left hip joint;  DJD knees w/ ?CPPD, abn patella;  left wrist fx- radial  head & ulnar styloid... she had surg left wrist by DrKKuzma... ~  4/12:  DrBlackman did right THR but required revision of acetabular component 4d later> went to [facility] for rehab... ~  DrBlackman continues to follow w/ shots as needed for bursitis she says... ~  10/12:  She is c/o right shoulder pain & decr ROM, she wonders if this might be the cause of her chest discomfort; she will f/u w/ Ortho for XRays & shot...  GYN >>  ~  2/13: pt mentioned some spotting & apparently has a pessary in place since her days in [place]; she is in need of GYN eval & check up & we will refer... ~  2/13: she saw DrFernandez for GYN> said her vag walls & Cx were irritated & friable, he removed pessary & Rx w/ estrace cream, she was to ret in 10d but didn't go (pessary is still out & she has not experienced any descensus so far); she will sched GYN follow up at her convenience...  Hx of BACK PAIN, LUMBAR (ICD-724.2) CHRONIC PAIN SYNDROME (ICD-338.4) - she has chronic pain- mostly from her arthritic condition- and was started on MSContin by her LMD in [place] in the summer of 2011> dose slowly increased & she was on 90mg  Bid (taking a 60mg  tab + 30mg  tab Bid)... she does not believe that this med has anything to do w/ her fall & wrist fx, or her complaints of decr memory etc... we discussed the need to wean off this medication & try to deal w/ her arthritis pain thru an Orthopedist or poss a Pain Clinic... ~  11/11:  she notes pain worse during the day while up & about> try to decr MSContin to 90mg AM & 60mg PM... ~  1/12 & 4/12:  she is down to 60mg Bid & we discussed  weaning further ==> she was down to 45mg  (30+15) Bid by the time of her right THR... ~  6/12:  Recommend that she try to wean further> try 45am & 30pm... ~  7/12:  She's down to 30mg   Bid & will wean further as able... ~  10/12:  She remains on the MSContin at 30mg Bid... ~  12/12:  We discussed further slow wean down to 30-15 first... ~  2/13:  We discussed trying to wean down to 15mg  Bid over the next few months, but she was unable to do so; states that drBlackman feels it's due to her back...  OSTEOPENIA (ICD-733.90) - XRays here showed severe osteopenia... she notes that she used to be on Fosamax but ?how long? & when stopped?... she states that she was told her prev BMD was "good"... she has not been taking calcium supplements, but was prev on some OTC Vit D supplements... asked to resume Calcium, Women's MVI, Vit D 1000u daily...  Hx of DEPRESSION (ICD-311) - on CELEXA 20mg /d and she wants to continue this med...  ANEMIA, MILD (ICD-285.9) - Hg post op 11/11 wrist fx surg = 11.2 ~  6/12:  Labs showed Hg= 9.1, MCV= 77, Fe= 15 (7%sat), & she has B pos blood> we decided to incr Fe to Bid (may need IV Fe if not responding). ~  7/12:  Labs showed Hg= 10.0, MCV= 76, she reports stool checks at [facility] were NEG... ~  8/12: she reports that she had f/u Heme eval DrMohammed & "he released me" on Fe + VitC daily... ~  10/12:  Labs post hosp showed Hg= 11.0, MCV= 87 ~  Labs 5/13 showed Hg= 12.3   Past Surgical History  Procedure Date  . Appendectomy   . Breast surgery   . Right wrist surgery   . Bilateral cataracts   . Hip surgery   . Tonsillectomy   . Adenoidectomy     Outpatient Encounter Prescriptions as of 05/23/2011  Medication Sig Dispense Refill  . aspirin 81 MG EC tablet Take 81 mg by mouth daily.        . Calcium Carbonate-Vit D-Min (CALTRATE 600+D PLUS) 600-400 MG-UNIT per tablet Chew 1 tablet by mouth 2 (two) times daily.       . carvedilol (COREG) 6.25 MG tablet Take 1 tablet  (6.25 mg total) by mouth 2 (two) times daily with a meal.  60 tablet  6  . Cholecalciferol (VITAMIN D-1000 MAX ST) 1000 UNITS tablet Take 2,000 Units by mouth every evening.       . citalopram (CELEXA) 20 MG tablet Take 1 tablet (20 mg total) by mouth daily.  30 tablet  6  . clotrimazole-betamethasone (LOTRISONE) cream Apply topically 2 (two) times daily.  30 g  0  . estradiol (ESTRACE VAGINAL) 0.1 MG/GM vaginal cream Place 0.25 Applicatorfuls vaginally daily. Apply intravaginally for ten nights then two times a week thereafter  42.5 g  12  . ferrous sulfate 325 (65 FE) MG tablet Take 325 mg by mouth 2 (two) times daily.        . Fluticasone-Salmeterol (ADVAIR DISKUS) 100-50 MCG/DOSE AEPB Inhale 1 puff into the lungs every 12 (twelve) hours.  60 each  11  . furosemide (LASIX) 40 MG tablet Take 1 tablet (40 mg total) by mouth daily.  30 tablet  1  . lisinopril (PRINIVIL,ZESTRIL) 2.5 MG tablet Take 1 tablet (2.5 mg total) by mouth daily.  30 tablet  11  . loratadine (CLARITIN) 10 MG tablet Take 10 mg by mouth daily.        Marland Kitchen lovastatin (MEVACOR) 20 MG tablet Take 1 tablet (20 mg total) by mouth at bedtime.  30 tablet  11  . morphine (MS CONTIN) 15  MG 12 hr tablet Take as directed  60 tablet  0  . morphine (MS CONTIN) 30 MG 12 hr tablet Take 1 tablet (30 mg total) by mouth 2 (two) times daily.  60 tablet  0  . Multiple Vitamins-Minerals (CERTA-VITE PO) Take 1 tablet by mouth daily.        Marland Kitchen oxyCODONE (OXY IR/ROXICODONE) 5 MG immediate release tablet Take 5 mg by mouth every 4 (four) hours as needed. As needed for pain      . polyethylene glycol (MIRALAX / GLYCOLAX) packet Take 17 g by mouth daily as needed.       Marland Kitchen spironolactone (ALDACTONE) 25 MG tablet Take 1/2 tablet daily  30 tablet  6  . tiotropium (SPIRIVA) 18 MCG inhalation capsule Place 1 capsule (18 mcg total) into inhaler and inhale daily.  30 capsule  11  . vitamin C (ASCORBIC ACID) 500 MG tablet Take 500 mg by mouth daily.        Marland Kitchen  zolpidem (AMBIEN) 10 MG tablet Take 1 tablet (10 mg total) by mouth at bedtime as needed for sleep.  30 tablet  5    Allergies  Allergen Reactions  . Sulfonamide Derivatives Swelling    Current Medications, Allergies, Past Medical History, Past Surgical History, Family History, and Social History were reviewed in [employer] record.    Review of Systems: Constitutional:  Denies F/C/S, anorexia, unexpected weight change. HEENT:  No HA, visual changes, earache, nasal symptoms, sore throat, hoarseness. Resp:  No cough, sputum, hemoptysis; mild SOB/ DOE noted... Cardio:  No CP, palpit, orthopnea;  +edema & DOE but limited mobility. GI:  Denies N/V/D, tends toward constip due to narcotics; swallowing OK & denies reflux or abd pain.  GU:  No dysuria, freq, urgency, hematuria, or flank pain. MS:  Severe DJD esp right hip w/ pain & decr ROM ==> s/p right THR now... Neuro:  No tremors, seizures, dizziness, syncope; +weakness & multifactorial gait abn. Skin:  No suspicious lesions or skin rash. Heme:  No adenopathy, bruising, bleeding. Psyche: Denies confusion, sleep disturbance, hallucinations; +anxiety & situational depression.    Objective:  Physical Exam:  WD, WN, 78 y/o WF chr ill appearing but in NAD... Vital Signs:  Reviewed... General:  Alert & oriented; pleasant & cooperative... HEENT:  [place]/AT, EOM-wnl, PERRLA, EACs-clear, TMs-wnl, NOSE-clear, THROAT-clear & wnl. Neck:  Supple w/ decr ROM; no JVD; normal carotid impulses w/o bruits; no thyromegaly or nodules palpated; no lymphadenopathy. Chest:  Clear to P & A; without wheezes/ rales/ or rhonchi heard... Heart:  Regular Rhythm; norm S1 & S2 without murmurs/ rubs/ or gallops detected... Abdomen:  Soft & nontender; normal bowel sounds; no organomegaly or masses palpated... Ext:  decrROM; +deformities & mod arthritic changes; no varicose veins, +venous insuffic & 1+edema;  Pulses intact w/o bruits... s/p right  THR w/ revision; ambulates w/ walker... Neuro:  CNs intact;  No focal neuro deficits, in wheelchair & painful standing & walking... Derm:  No lesions noted; no rash etc... Lymph:  No cervical, supraclavicular, axillary, or inguinal adenopathy palpated...   RADIOLOGY DATA:  Reviewed in the EPIC EMR & discussed w/ the patient...  LABORATORY DATA:  Reviewed in the EPIC EMR & discussed w/ the patient...   Assessment & Plan:   Acute on Chr Resp Failure>  Multifactorial w/ diastolic CHF, elev right hemidiaph, atx, scoliosis, restrictive lung dis, narcotic pain meds- all playing a role... O2 sats are now improved & she can decr the oxygen  to as needed at rest & 2L/min w/ exercise...  Elev right hemidiaph>  This is chronic & assoc w/ some mild basilar atx; encouraged to get deep breaths & expand well...  HBP, etc>  BP is back up w/ her 10# wt gain & worsening CHF; we are increasing her diuretic & plan close f/u...  Hx of Myopericarditis w/ abn EKG/ Enz & subseq vol overload w/ abn 2DEcho>  She improved back on her diuretic & meds were adjusted in the CHF clinic; repeat 2DEcho 12/12 w/ recovery of LVF & back to her baseline...  Diastolic CHF>  BNP was much improved ==> then weight up a few lbs & BNP ~350; rec transient incr Lasix to 80mg /d as needed...  VI, Edema>  Discussed Lasix40==>80, no salt, elevate legs, TED hose, etc...  CHOL>  ?Mevacor on her list, but not currently taking, we will try to sort this out & f/u FLP later...  GERD, Dysphagia>  She had EGD during prev hosp w/ HH, gastritis & neg HPylori;  Prev on PPI Rx but off now & she feels she is OK, we will follow.  Hx of Colitis- presumed infectious>  Resolved w/ Cipro/ Flagyl; off meds and back to baseline GI status she feels...  Divertics, Constip>  Related to her narcotic analgesics; improved w/ laxatives OTC...  DJD, s/p right THR, Chr Pain Syndrome> Trying to wean MSContin further which should be poss based on the fact that the  painful right hip has been replaced...  Other medical problems as noted...    GYN> pt c/o spotting & has pessary in place since her last check in [place]; we will help her get a gyn appt ASAP for eval & check up...    Derm> she has a seborrheic dermatitis rash & we will Rx w/ Lotrisone cream..   Patient's Medications  New Prescriptions   No medications on file  Previous Medications   ASPIRIN 81 MG EC TABLET    Take 81 mg by mouth daily.     CALCIUM CARBONATE-VIT D-MIN (CALTRATE 600+D PLUS) 600-400 MG-UNIT PER TABLET    Chew 1 tablet by mouth 2 (two) times daily.    CARVEDILOL (COREG) 6.25 MG TABLET    Take 1 tablet (6.25 mg total) by mouth 2 (two) times daily with a meal.   CHOLECALCIFEROL (VITAMIN D-1000 MAX ST) 1000 UNITS TABLET    Take 2,000 Units by mouth every evening.    CITALOPRAM (CELEXA) 20 MG TABLET    Take 1 tablet (20 mg total) by mouth daily.   CLOTRIMAZOLE-BETAMETHASONE (LOTRISONE) CREAM    Apply topically 2 (two) times daily.   ESTRADIOL (ESTRACE VAGINAL) 0.1 MG/GM VAGINAL CREAM    Place 0.25 Applicatorfuls vaginally daily. Apply intravaginally for ten nights then two times a week thereafter   FERROUS SULFATE 325 (65 FE) MG TABLET    Take 325 mg by mouth 2 (two) times daily.     FLUTICASONE-SALMETEROL (ADVAIR DISKUS) 100-50 MCG/DOSE AEPB    Inhale 1 puff into the lungs every 12 (twelve) hours.   FUROSEMIDE (LASIX) 40 MG TABLET    Take 1 tablet (40 mg total) by mouth daily.   LISINOPRIL (PRINIVIL,ZESTRIL) 2.5 MG TABLET    Take 1 tablet (2.5 mg total) by mouth daily.   LORATADINE (CLARITIN) 10 MG TABLET    Take 10 mg by mouth daily.     LOVASTATIN (MEVACOR) 20 MG TABLET    Take 1 tablet (20 mg total) by mouth at bedtime.  MORPHINE (MS CONTIN) 15 MG 12 HR TABLET    Take as directed   MORPHINE (MS CONTIN) 30 MG 12 HR TABLET    Take 1 tablet (30 mg total) by mouth 2 (two) times daily.   MULTIPLE VITAMINS-MINERALS (CERTA-VITE PO)    Take 1 tablet by mouth daily.     OXYCODONE  (OXY IR/ROXICODONE) 5 MG IMMEDIATE RELEASE TABLET    Take 5 mg by mouth every 4 (four) hours as needed. As needed for pain   POLYETHYLENE GLYCOL (MIRALAX / GLYCOLAX) PACKET    Take 17 g by mouth daily as needed.    SPIRONOLACTONE (ALDACTONE) 25 MG TABLET    Take 1/2 tablet daily   TIOTROPIUM (SPIRIVA) 18 MCG INHALATION CAPSULE    Place 1 capsule (18 mcg total) into inhaler and inhale daily.   VITAMIN C (ASCORBIC ACID) 500 MG TABLET    Take 500 mg by mouth daily.    Modified Medications   Modified Medication Previous Medication   ZOLPIDEM (AMBIEN) 10 MG TABLET zolpidem (AMBIEN) 10 MG tablet      Take 1 tablet (10 mg total) by mouth at bedtime as needed for sleep.    Take 1 tablet (10 mg total) by mouth at bedtime as needed for sleep.  Discontinued Medications   No medications on file

## 2011-05-23 NOTE — Patient Instructions (Signed)
Today we updated your med list in our EPIC system...    Continue your current medications the same...  We decided to allow 1-2 Lasix fluid pills each AM:    Take one tab regularly...    You may take 2 tabs any morning that the swelling doesn't go down sufficiently overnight...  Call for any questions...  Let's plan a follow up visit in 3 months, sooner if needed for problems.Marland KitchenMarland Kitchen

## 2011-06-23 ENCOUNTER — Telehealth: Payer: Self-pay | Admitting: Pulmonary Disease

## 2011-06-23 MED ORDER — MORPHINE SULFATE ER 15 MG PO TBCR: 15.0000 mg | EXTENDED_RELEASE_TABLET | Freq: Two times a day (BID) | ORAL | Status: DC

## 2011-06-23 NOTE — Telephone Encounter (Signed)
Called and spoke with pts son mike and he is aware that rx is up front and ready to be picked up.

## 2011-06-23 NOTE — Telephone Encounter (Signed)
rx has been printed and placed on SN cart to be signed.  

## 2011-07-19 ENCOUNTER — Telehealth: Payer: Self-pay | Admitting: Pulmonary Disease

## 2011-07-19 MED ORDER — MORPHINE SULFATE ER 30 MG PO TBCR: 30.0000 mg | EXTENDED_RELEASE_TABLET | Freq: Two times a day (BID) | ORAL | Status: DC

## 2011-07-19 NOTE — Telephone Encounter (Signed)
Pt last seen by SN 5.21.13, next ov 8.21.13.  Called spoke with pt's son [name] who verified that pt is needing a refill on her morphine 30mg  > 1 po BID.  Med last filled 5.14.13.  rx printed for SN to sign.  Will call [name] once completed and he is aware this will need to be picked up.

## 2011-07-19 NOTE — Telephone Encounter (Signed)
rx has been signed and placed up front to be picked up.  Nothing further needed.

## 2011-07-25 ENCOUNTER — Other Ambulatory Visit: Payer: Self-pay | Admitting: Pulmonary Disease

## 2011-07-25 MED ORDER — FUROSEMIDE 40 MG PO TABS: 40.0000 mg | ORAL_TABLET | Freq: Every day | ORAL | Status: DC

## 2011-07-30 ENCOUNTER — Emergency Department (HOSPITAL_COMMUNITY): Payer: Medicare Other

## 2011-07-30 ENCOUNTER — Encounter (HOSPITAL_COMMUNITY): Payer: Self-pay | Admitting: Emergency Medicine

## 2011-07-30 ENCOUNTER — Emergency Department (HOSPITAL_COMMUNITY)
Admission: EM | Admit: 2011-07-30 | Discharge: 2011-07-30 | Disposition: A | Discharge status: Skilled Nursing Facility | Payer: Medicare Other | Attending: Emergency Medicine | Admitting: Emergency Medicine

## 2011-07-30 DIAGNOSIS — G894 Chronic pain syndrome: Secondary | ICD-10-CM | POA: Insufficient documentation

## 2011-07-30 DIAGNOSIS — Z79899 Other long term (current) drug therapy: Secondary | ICD-10-CM | POA: Insufficient documentation

## 2011-07-30 DIAGNOSIS — Z7982 Long term (current) use of aspirin: Secondary | ICD-10-CM | POA: Insufficient documentation

## 2011-07-30 DIAGNOSIS — Y9210 Unspecified residential institution as the place of occurrence of the external cause: Secondary | ICD-10-CM | POA: Insufficient documentation

## 2011-07-30 DIAGNOSIS — Z9089 Acquired absence of other organs: Secondary | ICD-10-CM | POA: Insufficient documentation

## 2011-07-30 DIAGNOSIS — W050XXA Fall from non-moving wheelchair, initial encounter: Secondary | ICD-10-CM | POA: Insufficient documentation

## 2011-07-30 DIAGNOSIS — R51 Headache: Secondary | ICD-10-CM | POA: Insufficient documentation

## 2011-07-30 DIAGNOSIS — E7800 Pure hypercholesterolemia, unspecified: Secondary | ICD-10-CM | POA: Insufficient documentation

## 2011-07-30 DIAGNOSIS — I10 Essential (primary) hypertension: Secondary | ICD-10-CM | POA: Insufficient documentation

## 2011-07-30 DIAGNOSIS — Y92129 Unspecified place in nursing home as the place of occurrence of the external cause: Secondary | ICD-10-CM

## 2011-07-30 LAB — CBC
HCT: 36 % (ref 36.0–46.0)
Hemoglobin: 11.7 g/dL — ABNORMAL LOW (ref 12.0–15.0)
MCH: 28.4 pg (ref 26.0–34.0)
MCHC: 32.5 g/dL (ref 30.0–36.0)
MCV: 87.4 fL (ref 78.0–100.0)

## 2011-07-30 LAB — BASIC METABOLIC PANEL
BUN: 30 mg/dL — ABNORMAL HIGH (ref 6–23)
Calcium: 10 mg/dL (ref 8.4–10.5)
GFR calc non Af Amer: 37 mL/min — ABNORMAL LOW (ref >90)
Glucose, Bld: 97 mg/dL (ref 70–99)

## 2011-07-30 LAB — URINE MICROSCOPIC-ADD ON

## 2011-07-30 LAB — URINALYSIS, ROUTINE W REFLEX MICROSCOPIC
Glucose, UA: NEGATIVE mg/dL
Protein, ur: 30 mg/dL — AB

## 2011-07-30 MED ORDER — SODIUM CHLORIDE 0.9 % IV BOLUS (SEPSIS)
1000.0000 mL | Freq: Once | INTRAVENOUS | Status: AC
  Administered 2011-07-30: 1000 mL via INTRAVENOUS

## 2011-07-30 NOTE — ED Provider Notes (Signed)
Medical screening examination/treatment/procedure(s) were conducted as a shared visit with non-physician practitioner(s) and myself.  I personally evaluated the patient during the encounter   Pt alert and calm, and cooperative. No injury in fall. Nonfocal Neuro exam. She is stable for d/c.  Flint Melter, MD 07/30/11 2130

## 2011-07-30 NOTE — ED Notes (Signed)
Pt from [facility] assisted [facility], staff found her sitting beside her bed on the floor.  Possible unwitnessed fall.  Son wanted her checked out because he noticed a difference in her affect while talking on the phone to her today.  Pt. Reports slight headache on top of her head but no other acute pain.

## 2011-07-30 NOTE — ED Provider Notes (Signed)
History     CSN: [id number]  Arrival date & time 07/30/11  1141   First MD Initiated Contact with Patient 07/30/11 1214      Chief Complaint  Patient presents with  . Fall    (Consider location/radiation/quality/duration/timing/severity/associated sxs/prior treatment) The history is provided by the patient, a relative and medical records.    Katie Galvan is a 78 y.o. female who presents to the emergency room after being found on the floor by the staff at [facility].  Staff reported to the son that the patient was found sitting on the floor next to her bed with her wheelchair nearby, but was unable to tell them how she got there. She states she did have a mild headache at the top of her head, but it has resolved on its own and did not radiate anywhere.  She did not take anything for the headache.  She has a mild discomfort in her buttox, feeling as if she has been sitting too long, but denies neck or back pain.  She denies blood thinners, but does take ASA 81mg .  She denies nausea, vomiting, diarrhea, chest pain, shortness of breath, fatigue, fever, diaphoresis, abdominal pain, weakness, dizziness.  She admits to having word finding difficulty this morning.  Her son as accompanied her to the ER and confirms that her speech is much more labored.  This is not normal for the patient.  He was with her yesterday and her speech was clear and unlabored then.  He also reports that the patient complained of not feeling well yesterday, but was unable to clarify further and was acting normally.      Past Medical History  Diagnosis Date  . Disorders of diaphragm   . Unspecified essential hypertension   . Endocarditis, valve unspecified, unspecified cause   . Cardiac dysrhythmia, unspecified   . Unspecified venous (peripheral) insufficiency   . Edema   . Pure hypercholesterolemia   . Other dysphagia   . Diverticulosis of colon (without mention of hemorrhage)   . Benign neoplasm of colon   .  Osteoarthrosis, unspecified whether generalized or localized, unspecified site   . Lumbago   . Chronic pain syndrome   . Disorder of bone and cartilage, unspecified   . Depressive disorder, not elsewhere classified   . Anemia, unspecified   . Skin cancer   . Systolic heart failure     echo 10/10/10 EF 30%    Past Surgical History  Procedure Date  . Appendectomy   . Breast surgery   . Right wrist surgery   . Bilateral cataracts   . Hip surgery   . Tonsillectomy   . Adenoidectomy     Family History  Problem Relation Age of Onset  . Diabetes Mother   . Heart disease Father   . Lung cancer Sister     History  Substance Use Topics  . Smoking status: Never Smoker   . Smokeless tobacco: Never Used  . Alcohol Use: No    OB History    Grav Para Term Preterm Abortions TAB SAB Ect Mult Living   2 2 1 1      2       Review of Systems  Constitutional: Negative for fever, diaphoresis, appetite change and fatigue.  HENT: Negative for nosebleeds, trouble swallowing, neck pain and neck stiffness.   Eyes: Negative for photophobia and visual disturbance.  Respiratory: Negative for cough, chest tightness, shortness of breath and wheezing.   Cardiovascular: Negative for chest pain and  leg swelling.  Gastrointestinal: Negative for nausea, vomiting, abdominal pain, diarrhea and constipation.  Genitourinary: Negative for dysuria, urgency, frequency and hematuria.  Musculoskeletal: Negative for back pain.       Mild discomfort in her buttox, feeling as if she has been sitting too long.    Skin: Negative for rash.  Neurological: Positive for speech difficulty (word finding difficulty) and headaches. Negative for dizziness, syncope, weakness and light-headedness.  Hematological: Does not bruise/bleed easily.  Psychiatric/Behavioral: The patient is not nervous/anxious.     Allergies  Sulfonamide derivatives  Home Medications   Current Outpatient Rx  Name Route Sig Dispense Refill    . ASPIRIN 81 MG PO TBEC Oral Take 81 mg by mouth daily.      Marland Kitchen CALTRATE 600+D PLUS 600-400 MG-UNIT PO CHEW Oral Chew 1 tablet by mouth 2 (two) times daily.     Marland Kitchen CARVEDILOL 6.25 MG PO TABS Oral Take 1 tablet (6.25 mg total) by mouth 2 (two) times daily with a meal. 60 tablet 6  . VITAMIN D 1000 UNITS PO TABS Oral Take 2,000 Units by mouth daily.    Marland Kitchen CITALOPRAM HYDROBROMIDE 20 MG PO TABS Oral Take 1 tablet (20 mg total) by mouth daily. 30 tablet 6  . CLOTRIMAZOLE-BETAMETHASONE 1-0.05 % EX CREA Topical Apply topically 2 (two) times daily. 30 g 0  . FERROUS SULFATE 325 (65 FE) MG PO TABS Oral Take 325 mg by mouth 2 (two) times daily.      Marland Kitchen FLUTICASONE-SALMETEROL 100-50 MCG/DOSE IN AEPB Inhalation Inhale 1 puff into the lungs every 12 (twelve) hours. 60 each 11  . FUROSEMIDE 40 MG PO TABS Oral Take 1 tablet (40 mg total) by mouth daily. 30 tablet 0  . LISINOPRIL 2.5 MG PO TABS Oral Take 1 tablet (2.5 mg total) by mouth daily. 30 tablet 11  . LORATADINE 10 MG PO TABS Oral Take 10 mg by mouth daily.      . MORPHINE SULFATE ER 15 MG PO TBCR Oral Take 1 tablet (15 mg total) by mouth 2 (two) times daily. 60 tablet 0  . MORPHINE SULFATE ER 30 MG PO TBCR Oral Take 1 tablet (30 mg total) by mouth 2 (two) times daily. 60 tablet 0  . ADULT MULTIVITAMIN W/MINERALS CH Oral Take 1 tablet by mouth daily.    Marland Kitchen SPIRONOLACTONE 25 MG PO TABS Oral Take 12.5 mg by mouth daily. Take 1/2 tablet daily    . TIOTROPIUM BROMIDE MONOHYDRATE 18 MCG IN CAPS Inhalation Place 1 capsule (18 mcg total) into inhaler and inhale daily. 30 capsule 11  . VITAMIN C 500 MG PO TABS Oral Take 500 mg by mouth daily.      Marland Kitchen ZOLPIDEM TARTRATE 10 MG PO TABS Oral Take 1 tablet (10 mg total) by mouth at bedtime as needed for sleep. 30 tablet 5    BP 107/48  Pulse 58  Temp 98.5 F (36.9 C) (Oral)  Resp 18  SpO2 95%  Physical Exam  Nursing note and vitals reviewed. Constitutional: She is oriented to person, place, and time. She appears  well-developed and well-nourished. No distress.  HENT:  Head: Normocephalic and atraumatic.  Mouth/Throat: Oropharynx is clear and moist. No oropharyngeal exudate.  Eyes: Conjunctivae and EOM are normal. Pupils are equal, round, and reactive to light. No scleral icterus.  Neck: Normal range of motion. Neck supple.  Cardiovascular: Normal rate, regular rhythm, normal heart sounds and intact distal pulses.  Exam reveals no gallop and no friction rub.  No murmur heard. Pulmonary/Chest: Effort normal and breath sounds normal. No respiratory distress. She has no wheezes. She exhibits no tenderness.  Abdominal: Soft. Bowel sounds are normal. She exhibits no mass. There is no tenderness. There is no rebound and no guarding.  Musculoskeletal: Normal range of motion. She exhibits no edema and no tenderness.       Neg C-spine, T-spine, and L-spine tenderness to palpation along the bony prominences and paraspinal muscles  Lymphadenopathy:    She has no cervical adenopathy.  Neurological: She is alert and oriented to person, place, and time. She has normal reflexes. No cranial nerve deficit. She exhibits normal muscle tone. Coordination normal.       Pt follows commands Cranial nerves III - XII without deficit, no facial droop Normal strength in upper and lower extremities bilaterally, strong and equal grip strength Sensation normal to light and sharp touch No truncal ataxia, coordination intact Normal finger to nose and rapid alternating movements No slurred speech, speech is goal oriented; however speech is labored and pt is having some word finding difficulty  Skin: Skin is warm and dry. No rash noted. She is not diaphoretic.  Psychiatric: She has a normal mood and affect.    ED Course  Procedures (including critical care time)   Results for orders placed during the hospital encounter of 07/30/11  CBC      Component Value Range   WBC 13.5 (*) 4.0 - 10.5 K/uL   RBC 4.12  3.87 - 5.11 MIL/uL     Hemoglobin 11.7 (*) 12.0 - 15.0 g/dL   HCT 16.1  09.6 - 04.5 %   MCV 87.4  78.0 - 100.0 fL   MCH 28.4  26.0 - 34.0 pg   MCHC 32.5  30.0 - 36.0 g/dL   RDW 40.9  81.1 - 91.4 %   Platelets 289  150 - 400 K/uL  BASIC METABOLIC PANEL      Component Value Range   Sodium 138  135 - 145 mEq/L   Potassium 4.2  3.5 - 5.1 mEq/L   Chloride 95 (*) 96 - 112 mEq/L   CO2 27  19 - 32 mEq/L   Glucose, Bld 97  70 - 99 mg/dL   BUN 30 (*) 6 - 23 mg/dL   Creatinine, Ser 7.82 (*) 0.50 - 1.10 mg/dL   Calcium 95.6  8.4 - 21.3 mg/dL   GFR calc non Af Amer 37 (*) >90 mL/min   GFR calc Af Amer 43 (*) >90 mL/min  TROPONIN I      Component Value Range   Troponin I <0.30  <0.30 ng/mL  URINALYSIS, ROUTINE W REFLEX MICROSCOPIC      Component Value Range   Color, Urine AMBER (*) YELLOW   APPearance CLOUDY (*) CLEAR   Specific Gravity, Urine 1.027  1.005 - 1.030   pH 5.0  5.0 - 8.0   Glucose, UA NEGATIVE  NEGATIVE mg/dL   Hgb urine dipstick MODERATE (*) NEGATIVE   Bilirubin Urine SMALL (*) NEGATIVE   Ketones, ur TRACE (*) NEGATIVE mg/dL   Protein, ur 30 (*) NEGATIVE mg/dL   Urobilinogen, UA 0.2  0.0 - 1.0 mg/dL   Nitrite NEGATIVE  NEGATIVE   Leukocytes, UA SMALL (*) NEGATIVE  URINE MICROSCOPIC-ADD ON      Component Value Range   Squamous Epithelial / LPF RARE  RARE   WBC, UA 0-2  <3 WBC/hpf   RBC / HPF 21-50  <3 RBC/hpf   Casts HYALINE CASTS (*)  NEGATIVE   Crystals URIC ACID CRYSTALS (*) NEGATIVE   Urine-Other MUCOUS PRESENT     I have personally reviewed the images and reports.    Ct Head Wo Contrast  07/30/2011  *RADIOLOGY REPORT*  Clinical Data: Fall  CT HEAD WITHOUT CONTRAST  Technique:  Contiguous axial images were obtained from the base of the skull through the vertex without contrast.  Comparison: CT 03/13/2010  Findings: Generalized atrophy is unchanged.  Chronic microvascular ischemia in the white matter is unchanged.  Negative for acute infarct.  Negative for hemorrhage or mass. Negative  for skull fracture.  IMPRESSION: Atrophy and chronic microvascular ischemia.  No acute infarct or hemorrhage.  Original Report Authenticated By: Camelia Phenes, M.D.   [facility] Chest Port 1 View 07/30/2011 *RADIOLOGY REPORT*  Clinical Data: Shortness of breath, fever and cough.  PORTABLE CHEST - 1 VIEW  Comparison: 05/05/2011.  Findings: The cardiac silhouette, mediastinal and hilar contours are stable. There is tortuosity and calcification of the thoracic aorta. Stable marked elevation of the right hemidiaphragm and chronic bronchitic type lung changes but no infiltrates, edema or effusions. The bony thorax is intact.  IMPRESSION: Chronic lung changes but no acute pulmonary findings. Original Report Authenticated By: P. Loralie Champagne, M.D.  ECG:  Date: 07/30/2011  Rate: 58  Rhythm: normal sinus rhythm  QRS Axis: right  Intervals: normal  ST/T Wave abnormalities: normal  Conduction Disutrbances:none  Narrative Interpretation: sinus rhythm  Old EKG Reviewed: changes noted and ischemia on previous ECG, none on todays  On re-evaluation, Katie Galvan remains alert and oriented. Her speech is much less labored.  Her conversation is more fluent and she does not seem to be having much word finding difficulty.  3:13 PM  Awaiting results of CXR.  We will feed the patient and ambulate in hall.  If all is normal we will discharge home.  4:04 PM  1. Fall at nursing home     MDM  Rucha Wissinger presents to the emergency room after a fall.  Since she is unable to remember how or when she fell I proceeded with a syncope work-up.  Her head CT is unremarkable for acute processes.  He troponin is negative.  She has a slightly elevates WBC at 13.5 and hgb is at her baseline.  She has also had a rise in her BUN and Creatinine from her baseline.  Her urine is not indicative of a UTI, but we will send a culture. Her CXR is negative for acute processes and pneumonia; stable elevation of the R hemi-diaphragm is a  chronic finding.  She ambulates well and has tolerated PO intake.  I have discussed reasons for immediate return to the ER and patient and family state understanding.    1. Medications: usual home medications 2. Treatment: rest, hydrate with PO fluids 3. Follow Up: with PCP if concerns persist, to ED if [facility] returns          [facility], PA-C 07/30/11 1627

## 2011-07-30 NOTE — ED Notes (Signed)
MD at bedside. 

## 2011-07-30 NOTE — ED Notes (Signed)
WUJ:[id number]<JY> Expected date:07/30/11<BR> Expected time:11:27 AM<BR> Means of arrival:Ambulance<BR> Comments:<BR> fall

## 2011-07-30 NOTE — ED Notes (Signed)
Family at bedside. 

## 2011-07-30 NOTE — ED Provider Notes (Signed)
Katie Galvan is a 78 y.o. female who is on the floor, with her assisted living facility. There were no injuries. She had transient altered mental status that gradually has improved during the day. Her son describes it as being quite as responsive as usual. He has been with her here today. Patient is at her baseline according to son. Repeat vital signs are reassuring. Head no visible lesion. Neck supple. Heart regular rate and rhythm. No murmur. Lungs clear to auscultation. Back nontender to palpation. Extremities normal range of motion. Neurologic no focal abnormality.   Assessment: Fall, without serious injury. No evident toxic, metabolic or infectious process to cause fall. Patient stable for discharge.     Flint Melter, MD 07/30/11 1606

## 2011-07-31 ENCOUNTER — Telehealth: Payer: Self-pay | Admitting: Pulmonary Disease

## 2011-07-31 NOTE — Telephone Encounter (Signed)
I spoke with pt son and he states pt went to the ED on 7/28.13 bc she had fallen out of bed and was found on the floor. They accessed pt and nothing seemed broken. They did lab work on pt and stated the labs were normal. He states although when the nurse took the urine sample it was strong odor from the sample and was told she may have a possible UTI. He is wanting SN to look over her chart and labs. He states the PA and the doctor was not on the same page with things. He also states pt has fallen several times over the past 4-5 months and doesn't remember falling. Please advise SN thanks

## 2011-07-31 NOTE — Telephone Encounter (Signed)
SN has all of the pts info and is going to call the pts son and speak to him about the results.

## 2011-08-01 LAB — URINE CULTURE

## 2011-08-02 ENCOUNTER — Inpatient Hospital Stay (HOSPITAL_COMMUNITY)
Admission: EM | Admit: 2011-08-02 | Discharge: 2011-08-07 | DRG: 309 | Disposition: A | Discharge status: Skilled Nursing Facility | Payer: Medicare Other | Attending: Internal Medicine | Admitting: Internal Medicine

## 2011-08-02 ENCOUNTER — Emergency Department (HOSPITAL_COMMUNITY): Payer: Medicare Other

## 2011-08-02 ENCOUNTER — Encounter (HOSPITAL_COMMUNITY): Payer: Self-pay | Admitting: Emergency Medicine

## 2011-08-02 DIAGNOSIS — R001 Bradycardia, unspecified: Secondary | ICD-10-CM

## 2011-08-02 DIAGNOSIS — R55 Syncope and collapse: Secondary | ICD-10-CM

## 2011-08-02 DIAGNOSIS — Z9181 History of falling: Secondary | ICD-10-CM

## 2011-08-02 DIAGNOSIS — A498 Other bacterial infections of unspecified site: Secondary | ICD-10-CM | POA: Diagnosis present

## 2011-08-02 DIAGNOSIS — I498 Other specified cardiac arrhythmias: Principal | ICD-10-CM | POA: Diagnosis present

## 2011-08-02 DIAGNOSIS — I10 Essential (primary) hypertension: Secondary | ICD-10-CM | POA: Diagnosis present

## 2011-08-02 DIAGNOSIS — J9611 Chronic respiratory failure with hypoxia: Secondary | ICD-10-CM

## 2011-08-02 DIAGNOSIS — J9610 Chronic respiratory failure, unspecified whether with hypoxia or hypercapnia: Secondary | ICD-10-CM | POA: Diagnosis present

## 2011-08-02 DIAGNOSIS — R0902 Hypoxemia: Secondary | ICD-10-CM | POA: Diagnosis present

## 2011-08-02 DIAGNOSIS — N390 Urinary tract infection, site not specified: Secondary | ICD-10-CM

## 2011-08-02 DIAGNOSIS — G894 Chronic pain syndrome: Secondary | ICD-10-CM

## 2011-08-02 DIAGNOSIS — R4182 Altered mental status, unspecified: Secondary | ICD-10-CM

## 2011-08-02 DIAGNOSIS — I509 Heart failure, unspecified: Secondary | ICD-10-CM | POA: Diagnosis present

## 2011-08-02 DIAGNOSIS — I951 Orthostatic hypotension: Secondary | ICD-10-CM | POA: Diagnosis present

## 2011-08-02 DIAGNOSIS — D649 Anemia, unspecified: Secondary | ICD-10-CM

## 2011-08-02 DIAGNOSIS — I5032 Chronic diastolic (congestive) heart failure: Secondary | ICD-10-CM

## 2011-08-02 LAB — CARDIAC PANEL(CRET KIN+CKTOT+MB+TROPI)
CK, MB: 13 ng/mL (ref 0.3–4.0)
Relative Index: 4.1 — ABNORMAL HIGH (ref 0.0–2.5)
Total CK: 314 U/L — ABNORMAL HIGH (ref 7–177)
Troponin I: <0.3 ng/mL (ref <0.30)

## 2011-08-02 LAB — URINALYSIS, ROUTINE W REFLEX MICROSCOPIC
Glucose, UA: NEGATIVE mg/dL
Nitrite: NEGATIVE
pH: 5.5 (ref 5.0–8.0)

## 2011-08-02 LAB — CBC WITH DIFFERENTIAL/PLATELET
HCT: 33.8 % — ABNORMAL LOW (ref 36.0–46.0)
Hemoglobin: 10.8 g/dL — ABNORMAL LOW (ref 12.0–15.0)
Lymphocytes Relative: 12 % (ref 12–46)
Lymphs Abs: 1.4 10*3/uL (ref 0.7–4.0)
MCHC: 32 g/dL (ref 30.0–36.0)
Monocytes Absolute: 1.1 10*3/uL — ABNORMAL HIGH (ref 0.1–1.0)
Monocytes Relative: 9 % (ref 3–12)
Neutro Abs: 9.3 10*3/uL — ABNORMAL HIGH (ref 1.7–7.7)
WBC: 12.1 10*3/uL — ABNORMAL HIGH (ref 4.0–10.5)

## 2011-08-02 LAB — BASIC METABOLIC PANEL
BUN: 35 mg/dL — ABNORMAL HIGH (ref 6–23)
CO2: 28 mEq/L (ref 19–32)
Chloride: 100 mEq/L (ref 96–112)
Creatinine, Ser: 1.03 mg/dL (ref 0.50–1.10)
Glucose, Bld: 95 mg/dL (ref 70–99)

## 2011-08-02 MED ORDER — LISINOPRIL 2.5 MG PO TABS
2.5000 mg | ORAL_TABLET | Freq: Every day | ORAL | Status: DC
  Administered 2011-08-03: 2.5 mg via ORAL
  Filled 2011-08-02 (×2): qty 1

## 2011-08-02 MED ORDER — VITAMIN D3 25 MCG (1000 UNIT) PO TABS
2000.0000 [IU] | ORAL_TABLET | Freq: Every day | ORAL | Status: DC
  Administered 2011-08-03 – 2011-08-07 (×5): 2000 [IU] via ORAL
  Filled 2011-08-02 (×6): qty 2

## 2011-08-02 MED ORDER — DEXTROSE 5 % IV SOLN
1.0000 g | INTRAVENOUS | Status: DC
  Administered 2011-08-03 – 2011-08-05 (×3): 1 g via INTRAVENOUS
  Filled 2011-08-02 (×3): qty 10

## 2011-08-02 MED ORDER — ASPIRIN 81 MG PO TBEC: 81.0000 mg | DELAYED_RELEASE_TABLET | Freq: Every day | ORAL | Status: DC

## 2011-08-02 MED ORDER — CITALOPRAM HYDROBROMIDE 20 MG PO TABS
20.0000 mg | ORAL_TABLET | Freq: Every day | ORAL | Status: DC
  Administered 2011-08-03 – 2011-08-07 (×5): 20 mg via ORAL
  Filled 2011-08-02 (×6): qty 1

## 2011-08-02 MED ORDER — SODIUM CHLORIDE 0.9 % IJ SOLN
3.0000 mL | Freq: Two times a day (BID) | INTRAMUSCULAR | Status: DC
Start: 2011-08-02 — End: 2011-08-07
  Administered 2011-08-03 – 2011-08-05 (×5): 3 mL via INTRAVENOUS

## 2011-08-02 MED ORDER — FERROUS SULFATE 325 (65 FE) MG PO TABS
325.0000 mg | ORAL_TABLET | Freq: Two times a day (BID) | ORAL | Status: DC
  Administered 2011-08-03 – 2011-08-07 (×9): 325 mg via ORAL
  Filled 2011-08-02 (×11): qty 1

## 2011-08-02 MED ORDER — FUROSEMIDE 40 MG PO TABS
40.0000 mg | ORAL_TABLET | Freq: Every day | ORAL | Status: DC
  Administered 2011-08-03 – 2011-08-07 (×5): 40 mg via ORAL
  Filled 2011-08-02 (×6): qty 1

## 2011-08-02 MED ORDER — FLUTICASONE-SALMETEROL 100-50 MCG/DOSE IN AEPB
1.0000 | INHALATION_SPRAY | Freq: Two times a day (BID) | RESPIRATORY_TRACT | Status: DC
  Administered 2011-08-02 – 2011-08-07 (×9): 1 via RESPIRATORY_TRACT
  Filled 2011-08-02: qty 14

## 2011-08-02 MED ORDER — MORPHINE SULFATE ER 30 MG PO TBCR: 30.0000 mg | EXTENDED_RELEASE_TABLET | Freq: Every day | ORAL | Status: DC

## 2011-08-02 MED ORDER — TIOTROPIUM BROMIDE MONOHYDRATE 18 MCG IN CAPS
18.0000 ug | ORAL_CAPSULE | Freq: Every day | RESPIRATORY_TRACT | Status: DC
  Administered 2011-08-02 – 2011-08-07 (×5): 18 ug via RESPIRATORY_TRACT
  Filled 2011-08-02: qty 5

## 2011-08-02 MED ORDER — SPIRONOLACTONE 12.5 MG HALF TABLET
12.5000 mg | ORAL_TABLET | Freq: Every day | ORAL | Status: DC
  Administered 2011-08-03 – 2011-08-07 (×5): 12.5 mg via ORAL
  Filled 2011-08-02 (×6): qty 1

## 2011-08-02 MED ORDER — MORPHINE SULFATE ER 30 MG PO TBCR
30.0000 mg | EXTENDED_RELEASE_TABLET | Freq: Every evening | ORAL | Status: DC
  Administered 2011-08-03: 30 mg via ORAL
  Filled 2011-08-02: qty 1

## 2011-08-02 MED ORDER — SODIUM CHLORIDE 0.9 % IJ SOLN
3.0000 mL | Freq: Two times a day (BID) | INTRAMUSCULAR | Status: DC
  Administered 2011-08-04 – 2011-08-07 (×4): 3 mL via INTRAVENOUS

## 2011-08-02 MED ORDER — MORPHINE SULFATE ER 15 MG PO TBCR
15.0000 mg | EXTENDED_RELEASE_TABLET | Freq: Every day | ORAL | Status: DC
  Administered 2011-08-03 – 2011-08-04 (×2): 15 mg via ORAL
  Filled 2011-08-02 (×2): qty 1

## 2011-08-02 MED ORDER — VITAMIN C 500 MG PO TABS
500.0000 mg | ORAL_TABLET | Freq: Every day | ORAL | Status: DC
  Administered 2011-08-03 – 2011-08-07 (×5): 500 mg via ORAL
  Filled 2011-08-02 (×6): qty 1

## 2011-08-02 MED ORDER — ADULT MULTIVITAMIN W/MINERALS CH
1.0000 | ORAL_TABLET | Freq: Every day | ORAL | Status: DC
  Administered 2011-08-03 – 2011-08-07 (×5): 1 via ORAL
  Filled 2011-08-02 (×6): qty 1

## 2011-08-02 MED ORDER — DEXTROSE 5 % IV SOLN
1.0000 g | Freq: Once | INTRAVENOUS | Status: AC
  Administered 2011-08-02: 1 g via INTRAVENOUS
  Filled 2011-08-02: qty 10

## 2011-08-02 MED ORDER — SODIUM CHLORIDE 0.9 % IJ SOLN: 3.0000 mL | INTRAMUSCULAR | Status: DC | PRN

## 2011-08-02 MED ORDER — SODIUM CHLORIDE 0.9 % IV SOLN: 250.0000 mL | INTRAVENOUS | Status: DC | PRN

## 2011-08-02 MED ORDER — ASPIRIN EC 81 MG PO TBEC
81.0000 mg | DELAYED_RELEASE_TABLET | Freq: Every day | ORAL | Status: DC
  Administered 2011-08-03 – 2011-08-07 (×5): 81 mg via ORAL
  Filled 2011-08-02 (×6): qty 1

## 2011-08-02 NOTE — ED Provider Notes (Addendum)
History     CSN: [id number]  Arrival date & time 08/02/11  1332   First MD Initiated Contact with Patient 08/02/11 1549      Chief Complaint  Patient presents with  . Altered Mental Status    (Consider location/radiation/quality/duration/timing/severity/associated sxs/prior treatment) HPI Comments: Patient presents with her son from her independent living facility for a fall that occurred last night but the patient does not remember it.  She states she woke up on the bedroom floor this morning with no memory of how she got there.  She has a small abrasion wound on her left elbow but denies pain anywhere.  Her son notes that she's had worsening shortness of breath over the last few days.  Patient does ambulate with a walker at baseline and he notes it takes her longer to move around her apartment than it normally would.  She's also had some increasing confusion in the mornings which does improve through the day.  There's been no fevers, vomiting or diarrhea.  No chest pain, abdominal pain or dysuria.  No cough.  Patient has been at her baseline home oxygen at 2 L 24 hours a day.  Patient was seen here 3 days ago for a fall as well.  Her evaluation showed no acute injuries or abnormalities and patient was discharged home.  Patient also feels that she's having increasing memory difficulties.  The history is provided by the patient and a relative. No language interpreter was used.    Past Medical History  Diagnosis Date  . Disorders of diaphragm   . Unspecified essential hypertension   . Endocarditis, valve unspecified, unspecified cause   . Cardiac dysrhythmia, unspecified   . Unspecified venous (peripheral) insufficiency   . Edema   . Pure hypercholesterolemia   . Other dysphagia   . Diverticulosis of colon (without mention of hemorrhage)   . Benign neoplasm of colon   . Osteoarthrosis, unspecified whether generalized or localized, unspecified site   . Lumbago   . Chronic pain  syndrome   . Disorder of bone and cartilage, unspecified   . Depressive disorder, not elsewhere classified   . Anemia, unspecified   . Skin cancer   . Systolic heart failure     echo 10/10/10 EF 30%    Past Surgical History  Procedure Date  . Appendectomy   . Breast surgery   . Right wrist surgery   . Bilateral cataracts   . Hip surgery   . Tonsillectomy   . Adenoidectomy     Family History  Problem Relation Age of Onset  . Diabetes Mother   . Heart disease Father   . Lung cancer Sister     History  Substance Use Topics  . Smoking status: Never Smoker   . Smokeless tobacco: Never Used  . Alcohol Use: No    OB History    Grav Para Term Preterm Abortions TAB SAB Ect Mult Living   2 2 1 1      2       Review of Systems  Constitutional: Negative.  Negative for fever and chills.  HENT: Negative.   Eyes: Negative.   Respiratory: Positive for shortness of breath. Negative for cough.   Cardiovascular: Negative.  Negative for chest pain.  Gastrointestinal: Negative.  Negative for nausea, vomiting, abdominal pain and diarrhea.  Genitourinary: Negative.  Negative for dysuria and vaginal discharge.  Musculoskeletal: Negative.  Negative for back pain.  Skin: Negative.  Negative for color change and rash.  Neurological: Negative.  Negative for headaches.  Hematological: Negative.  Negative for adenopathy.  Psychiatric/Behavioral: Negative for confusion.       Increasing memory difficulties  All other systems reviewed and are negative.    Allergies  Sulfonamide derivatives  Home Medications   Current Outpatient Rx  Name Route Sig Dispense Refill  . ASPIRIN 81 MG PO TBEC Oral Take 81 mg by mouth daily.      Marland Kitchen CALTRATE 600+D PLUS 600-400 MG-UNIT PO CHEW Oral Chew 1 tablet by mouth 2 (two) times daily.     Marland Kitchen CARVEDILOL 6.25 MG PO TABS Oral Take 1 tablet (6.25 mg total) by mouth 2 (two) times daily with a meal. 60 tablet 6  . VITAMIN D 1000 UNITS PO TABS Oral Take 2,000  Units by mouth daily.    Marland Kitchen CITALOPRAM HYDROBROMIDE 20 MG PO TABS Oral Take 1 tablet (20 mg total) by mouth daily. 30 tablet 6  . CLOTRIMAZOLE-BETAMETHASONE 1-0.05 % EX CREA Topical Apply 1 application topically 2 (two) times daily as needed. For rashes on forehead.    Marland Kitchen FERROUS SULFATE 325 (65 FE) MG PO TABS Oral Take 325 mg by mouth 2 (two) times daily.      Marland Kitchen FLUTICASONE-SALMETEROL 100-50 MCG/DOSE IN AEPB Inhalation Inhale 1 puff into the lungs every 12 (twelve) hours. 60 each 11  . FUROSEMIDE 40 MG PO TABS Oral Take 1 tablet (40 mg total) by mouth daily. 30 tablet 0  . LISINOPRIL 2.5 MG PO TABS Oral Take 1 tablet (2.5 mg total) by mouth daily. 30 tablet 11  . LORATADINE 10 MG PO TABS Oral Take 10 mg by mouth daily.      . MORPHINE SULFATE ER 15 MG PO TBCR Oral Take 15 mg by mouth daily. In am.    . MORPHINE SULFATE ER 30 MG PO TBCR Oral Take 30 mg by mouth daily. In pm.    . ADULT MULTIVITAMIN W/MINERALS CH Oral Take 1 tablet by mouth daily.    Marland Kitchen SPIRONOLACTONE 25 MG PO TABS Oral Take 12.5 mg by mouth daily.     Marland Kitchen TIOTROPIUM BROMIDE MONOHYDRATE 18 MCG IN CAPS Inhalation Place 1 capsule (18 mcg total) into inhaler and inhale daily. 30 capsule 11  . VITAMIN C 500 MG PO TABS Oral Take 500 mg by mouth daily.      Marland Kitchen ZOLPIDEM TARTRATE 10 MG PO TABS Oral Take 1 tablet (10 mg total) by mouth at bedtime as needed for sleep. 30 tablet 5    There were no vitals taken for this visit.  Physical Exam  Nursing note and vitals reviewed. Constitutional: She is oriented to person, place, and time. She appears well-developed and well-nourished.  Non-toxic appearance. She does not have a sickly appearance.  HENT:  Head: Normocephalic and atraumatic.  Eyes: Conjunctivae, EOM and lids are normal. Pupils are equal, round, and reactive to light. No scleral icterus.  Neck: Trachea normal and normal range of motion. Neck supple.  Cardiovascular: Normal rate and regular rhythm.   Murmur heard. Pulmonary/Chest:  Effort normal and breath sounds normal. No respiratory distress. She has no wheezes. She has no rales.  Abdominal: Soft. Normal appearance. There is no tenderness. There is no rebound, no guarding and no CVA tenderness.  Musculoskeletal: Normal range of motion.  Neurological: She is alert and oriented to person, place, and time. She has normal strength.  Skin: Skin is warm, dry and intact. No rash noted.  Psychiatric: She has a normal mood and  affect. Her behavior is normal. Judgment and thought content normal.    ED Course  Procedures (including critical care time)  Results for orders placed during the hospital encounter of 08/02/11  CBC WITH DIFFERENTIAL      Component Value Range   WBC 12.1 (*) 4.0 - 10.5 K/uL   RBC 3.86 (*) 3.87 - 5.11 MIL/uL   Hemoglobin 10.8 (*) 12.0 - 15.0 g/dL   HCT 09.8 (*) 11.9 - 14.7 %   MCV 87.6  78.0 - 100.0 fL   MCH 28.0  26.0 - 34.0 pg   MCHC 32.0  30.0 - 36.0 g/dL   RDW 82.9  56.2 - 13.0 %   Platelets 345  150 - 400 K/uL   Neutrophils Relative 77  43 - 77 %   Neutro Abs 9.3 (*) 1.7 - 7.7 K/uL   Lymphocytes Relative 12  12 - 46 %   Lymphs Abs 1.4  0.7 - 4.0 K/uL   Monocytes Relative 9  3 - 12 %   Monocytes Absolute 1.1 (*) 0.1 - 1.0 K/uL   Eosinophils Relative 2  0 - 5 %   Eosinophils Absolute 0.2  0.0 - 0.7 K/uL   Basophils Relative 0  0 - 1 %   Basophils Absolute 0.0  0.0 - 0.1 K/uL  BASIC METABOLIC PANEL      Component Value Range   Sodium 138  135 - 145 mEq/L   Potassium 4.2  3.5 - 5.1 mEq/L   Chloride 100  96 - 112 mEq/L   CO2 28  19 - 32 mEq/L   Glucose, Bld 95  70 - 99 mg/dL   BUN 35 (*) 6 - 23 mg/dL   Creatinine, Ser 8.65  0.50 - 1.10 mg/dL   Calcium 9.8  8.4 - 78.4 mg/dL   GFR calc non Af Amer 49 (*) >90 mL/min   GFR calc Af Amer 57 (*) >90 mL/min  PRO B NATRIURETIC PEPTIDE      Component Value Range   Pro B Natriuretic peptide (BNP) 1420.0 (*) 0 - 450 pg/mL  TROPONIN I      Component Value Range   Troponin I <0.30  <0.30  ng/mL  CK      Component Value Range   Total CK 509 (*) 7 - 177 U/L  URINALYSIS, ROUTINE W REFLEX MICROSCOPIC      Component Value Range   Color, Urine YELLOW  YELLOW   APPearance CLOUDY (*) CLEAR   Specific Gravity, Urine 1.019  1.005 - 1.030   pH 5.5  5.0 - 8.0   Glucose, UA NEGATIVE  NEGATIVE mg/dL   Hgb urine dipstick NEGATIVE  NEGATIVE   Bilirubin Urine NEGATIVE  NEGATIVE   Ketones, ur TRACE (*) NEGATIVE mg/dL   Protein, ur NEGATIVE  NEGATIVE mg/dL   Urobilinogen, UA 0.2  0.0 - 1.0 mg/dL   Nitrite NEGATIVE  NEGATIVE   Leukocytes, UA MODERATE (*) NEGATIVE  URINE MICROSCOPIC-ADD ON      Component Value Range   Squamous Epithelial / LPF FEW (*) RARE   WBC, UA 7-10  <3 WBC/hpf   Bacteria, UA FEW (*) RARE   Urine-Other MUCOUS PRESENT     Dg Chest 2 View  08/02/2011  *RADIOLOGY REPORT*  Clinical Data: Fall and increased shortness of breath.  CHEST - 2 VIEW  Comparison: 07/30/2011  Findings: There is stable elevation of the right hemidiaphragm and marked volume loss in the right lung.  Left lung remains clear.  No evidence for a pneumothorax.  Heart and mediastinum are stable.  No evidence for pleural effusions.  IMPRESSION: No acute findings.  Chronic elevation of the right hemidiaphragm.  Original Report Authenticated By: Richarda Overlie, M.D.   Ct Head Wo Contrast  07/30/2011  *RADIOLOGY REPORT*  Clinical Data: Fall  CT HEAD WITHOUT CONTRAST  Technique:  Contiguous axial images were obtained from the base of the skull through the vertex without contrast.  Comparison: CT 03/13/2010  Findings: Generalized atrophy is unchanged.  Chronic microvascular ischemia in the white matter is unchanged.  Negative for acute infarct.  Negative for hemorrhage or mass. Negative for skull fracture.  IMPRESSION: Atrophy and chronic microvascular ischemia.  No acute infarct or hemorrhage.  Original Report Authenticated By: Camelia Phenes, M.D.   Dg Chest Port 1 View  07/30/2011  *RADIOLOGY REPORT*  Clinical  Data: Shortness of breath, fever and cough.  PORTABLE CHEST - 1 VIEW  Comparison: 05/05/2011.  Findings: The cardiac silhouette, mediastinal and hilar contours are stable.  There is tortuosity and calcification of the thoracic aorta.  Stable marked elevation of the right hemidiaphragm and chronic bronchitic type lung changes but no infiltrates, edema or effusions.  The bony thorax is intact.  IMPRESSION: Chronic lung changes but no acute pulmonary findings.  Original Report Authenticated By: P. Loralie Champagne, M.D.      Date: 08/02/2011  Rate: 53  Rhythm: sinus bradycardia  QRS Axis: normal  Intervals: normal  ST/T Wave abnormalities: normal  Conduction Disutrbances:none  Narrative Interpretation:   Old EKG Reviewed: unchanged from 07-30-11    MDM  Patient with multiple falls over the last few weeks to months with unclear etiology.  Patient does not recall falling last night so it is unclear if this may have been a syncopal episode.   Patient with possible syncopal episodes and she does not recall how she ended up on the floor last night.  Given syncope in an 78 year old I believe she does warrant admission to the hospital for observation overnight.  Her EKG does not show acute changes.  Patient has questionable UTI and her urinalysis today.  I will send for urine culture and give her a dose of ceftriaxone at this time.  I have begun discussion with the son about the patient's possible need to move to a higher level of care such as assisted living from independent living and he understands that this is a possibility.     Nat Christen, MD 08/02/11 1616  Nat Christen, MD 08/02/11 1938  Pt discussed with Dr. Isidoro Donning for admission to tele, team 8.    Nat Christen, MD 08/02/11 2001

## 2011-08-02 NOTE — ED Notes (Signed)
Son reports intake decreased today and pt has not had any meds today.

## 2011-08-02 NOTE — ED Notes (Signed)
Pt son reports pt called him Sat and said she wasn't feeling well. Pts O2 hose was disconnected. Son reconnected tube and saw nothing unusual. Pt fell Katie Galvan and was brought to ER. Labs and xrays were done and no problem found. Pt went baqck to [facility] and over past couple days pt ambulation has decreased and unsteady. Son states pt not able to really communicate and complete sentences now.

## 2011-08-02 NOTE — H&P (Signed)
PCP:   Michele Mcalpine, MD   Chief Complaint:  Frequent falls and passing out  HPI: 77 yo female with h/o chronic chf, oxygen dep 2Lnc cont for diaphragmatic hernia and chronically elevated rt diaphragm, lives independently at assisted living who over the last several months has had 3 episodes of passing out and falling.  Her son is with her today also.  Every time it occurs at night when she is alone when she is getting out of bed to do something.  She does not have any recollection of any symptoms prior to the syncopal episode but only remembers waking up on the floor.  No incontinence and luckily has not broken anything major yet.  She was here several days ago with syncope cth was neg but was mildly bradycardic.  She is on several cardiac meds but there has been no adjustments in her months for about 6 months.  She has not been having any fever/n/v/dec po intake/le edema/d/abd pain/sob/or chest pain.  She is suppose to be wearing ted hose but she does not.  She does see dr Beatris Ship cardiology regularly.  There has been no focal neuro def noted by her or her son.  No cough.  She is noted today to be again mildly bradycardic and with a uti.  Denies any dysuria, hematuria, or inc in freq.  Her son says also that occasionally she gets confused but has no h/o dementia.  No seizure activity.  Review of Systems:  O/w neg  Past Medical History: Past Medical History  Diagnosis Date  . Disorders of diaphragm   . Unspecified essential hypertension   . Endocarditis, valve unspecified, unspecified cause   . Cardiac dysrhythmia, unspecified   . Unspecified venous (peripheral) insufficiency   . Edema   . Pure hypercholesterolemia   . Other dysphagia   . Diverticulosis of colon (without mention of hemorrhage)   . Benign neoplasm of colon   . Osteoarthrosis, unspecified whether generalized or localized, unspecified site   . Lumbago   . Chronic pain syndrome   . Disorder of bone and cartilage,  unspecified   . Depressive disorder, not elsewhere classified   . Anemia, unspecified   . Skin cancer   . Systolic heart failure     echo 10/10/10 EF 30%   Past Surgical History  Procedure Date  . Appendectomy   . Breast surgery   . Right wrist surgery   . Bilateral cataracts   . Hip surgery   . Tonsillectomy   . Adenoidectomy     Medications: Prior to Admission medications   Medication Sig Start Date End Date Taking? Authorizing Provider  aspirin 81 MG EC tablet Take 81 mg by mouth daily.     Yes Historical Provider, MD  Calcium Carbonate-Vit D-Min (CALTRATE 600+D PLUS) 600-400 MG-UNIT per tablet Chew 1 tablet by mouth 2 (two) times daily.    Yes Historical Provider, MD  carvedilol (COREG) 6.25 MG tablet Take 1 tablet (6.25 mg total) by mouth 2 (two) times daily with a meal. 11/21/10  Yes Hadassah Pais, PA  cholecalciferol (VITAMIN D) 1000 UNITS tablet Take 2,000 Units by mouth daily.   Yes Historical Provider, MD  citalopram (CELEXA) 20 MG tablet Take 1 tablet (20 mg total) by mouth daily. 01/25/11  Yes Michele Mcalpine, MD  clotrimazole-betamethasone (LOTRISONE) cream Apply 1 application topically 2 (two) times daily as needed. For rashes on forehead.   Yes Historical Provider, MD  ferrous sulfate 325 (65 FE) MG  tablet Take 325 mg by mouth 2 (two) times daily.     Yes Historical Provider, MD  Fluticasone-Salmeterol (ADVAIR DISKUS) 100-50 MCG/DOSE AEPB Inhale 1 puff into the lungs every 12 (twelve) hours. 02/21/11  Yes Michele Mcalpine, MD  furosemide (LASIX) 40 MG tablet Take 1 tablet (40 mg total) by mouth daily. 07/25/11  Yes Michele Mcalpine, MD  lisinopril (PRINIVIL,ZESTRIL) 2.5 MG tablet Take 1 tablet (2.5 mg total) by mouth daily. 12/12/10  Yes Michele Mcalpine, MD  loratadine (CLARITIN) 10 MG tablet Take 10 mg by mouth daily.     Yes Historical Provider, MD  morphine (MS CONTIN) 15 MG 12 hr tablet Take 15 mg by mouth daily. In am.   Yes Historical Provider, MD  morphine (MS CONTIN) 30 MG  12 hr tablet Take 30 mg by mouth daily. In pm.   Yes Historical Provider, MD  Multiple Vitamin (MULTIVITAMIN WITH MINERALS) TABS Take 1 tablet by mouth daily.   Yes Historical Provider, MD  spironolactone (ALDACTONE) 25 MG tablet Take 12.5 mg by mouth daily.  12/12/10  Yes Michele Mcalpine, MD  tiotropium (SPIRIVA) 18 MCG inhalation capsule Place 1 capsule (18 mcg total) into inhaler and inhale daily. 02/21/11  Yes Michele Mcalpine, MD  vitamin C (ASCORBIC ACID) 500 MG tablet Take 500 mg by mouth daily.     Yes Historical Provider, MD  zolpidem (AMBIEN) 10 MG tablet Take 1 tablet (10 mg total) by mouth at bedtime as needed for sleep. 05/23/11 10/07/11 Yes Michele Mcalpine, MD    Allergies:   Allergies  Allergen Reactions  . Sulfonamide Derivatives Swelling    Social History:  reports that she has never smoked. She has never used smokeless tobacco. She reports that she does not drink alcohol or use illicit drugs.  Family History: Family History  Problem Relation Age of Onset  . Diabetes Mother   . Heart disease Father   . Lung cancer Sister     Physical Exam: Filed Vitals:   08/02/11 1616  BP: 143/78  Pulse: 69  Resp: 18  SpO2: 100%   General appearance: alert, cooperative and no distress Lungs: clear to auscultation bilaterally Heart: regular rate and rhythm and systolic murmur: early systolic 3/6, blowing at 2nd left intercostal space Abdomen: soft, non-tender; bowel sounds normal; no masses,  no organomegaly Extremities: extremities normal, atraumatic, no cyanosis or edema Pulses: 2+ and symmetric Skin: Skin color, texture, turgor normal. No rashes or lesions Neurologic: Grossly normal  No focal def.  5/5 strength u and lbe.  Labs on Admission:   [facility] 08/02/11 1630  NA 138  K 4.2  CL 100  CO2 28  GLUCOSE 95  BUN 35*  CREATININE 1.03  CALCIUM 9.8  MG --  PHOS --    Basename 08/02/11 1630  WBC 12.1*  NEUTROABS 9.3*  HGB 10.8*  HCT 33.8*  MCV 87.6  PLT 345     Basename 08/02/11 1630  CKTOTAL 509*  CKMB --  CKMBINDEX --  TROPONINI <0.30   Radiological Exams on Admission: Dg Chest 2 View  08/02/2011  *RADIOLOGY REPORT*  Clinical Data: Fall and increased shortness of breath.  CHEST - 2 VIEW  Comparison: 07/30/2011  Findings: There is stable elevation of the right hemidiaphragm and marked volume loss in the right lung.  Left lung remains clear.  No evidence for a pneumothorax.  Heart and mediastinum are stable.  No evidence for pleural effusions.  IMPRESSION: No acute findings.  Chronic elevation of the right hemidiaphragm.  Original Report Authenticated By: Richarda Overlie, M.D.   Ct Head Wo Contrast  07/30/2011  *RADIOLOGY REPORT*  Clinical Data: Fall  CT HEAD WITHOUT CONTRAST  Technique:  Contiguous axial images were obtained from the base of the skull through the vertex without contrast.  Comparison: CT 03/13/2010  Findings: Generalized atrophy is unchanged.  Chronic microvascular ischemia in the white matter is unchanged.  Negative for acute infarct.  Negative for hemorrhage or mass. Negative for skull fracture.  IMPRESSION: Atrophy and chronic microvascular ischemia.  No acute infarct or hemorrhage.  Original Report Authenticated By: Camelia Phenes, M.D.   Dg Chest Port 1 View  07/30/2011  *RADIOLOGY REPORT*  Clinical Data: Shortness of breath, fever and cough.  PORTABLE CHEST - 1 VIEW  Comparison: 05/05/2011.  Findings: The cardiac silhouette, mediastinal and hilar contours are stable.  There is tortuosity and calcification of the thoracic aorta.  Stable marked elevation of the right hemidiaphragm and chronic bronchitic type lung changes but no infiltrates, edema or effusions.  The bony thorax is intact.  IMPRESSION: Chronic lung changes but no acute pulmonary findings.  Original Report Authenticated By: P. Loralie Champagne, M.D.    Assessment/Plan Present on Admission:  78 yo female with multiple syncopal episodes of unclear etiology .Altered mental  status .UTI (lower urinary tract infection) .Syncope .Sinus bradycardia .HYPERTENSION .Chronic respiratory failure with hypoxia .Diastolic CHF, chronic  Likely cardiac in origin medication affect.  Will hold her coreg but continue all of her other cardiac meds.  Tele monitor for any significant bradycardia (50's now)  Will ck orthostatics now and q shift.  Will reck 2d echo and carotid dopplers.  Will not do mri due to lack of neurological deficits.  Cont asa.  If all w/u neg here in hosp she will likely need a [facility] set up as outpt.  Will also ck her oxygen sats with ambulation and see if she is desating with activity.  Rocephin for her uti.  Ted hose.  PT eval may need strh at discharge.  All of the plan discussed with her and her son and all questions answered.  Consider getting cardiology input if above negative.  Serial cardiac enzymes.  Her chf is compensated at this time.  Hazaiah Edgecombe A [phone] 08/02/2011, 8:06 PM

## 2011-08-02 NOTE — ED Notes (Addendum)
Pt presenting to ed with c/o confusion. Pt's son states pt fell x 3 days ago and was seen here. Pt's son states that pt had ct scan that was negative, bloodwork, urinalysis and chest xray that were all negative. Pt's son states pt seems to be getting more and more confused. Pt is alert but disoriented to the year. Per pt's son pt is not able to focus like she normally does. Pt with no slurred speech noted. Pt with bilateral equal grips no facial droop noted

## 2011-08-03 ENCOUNTER — Ambulatory Visit: Payer: Medicare Other | Admitting: Pulmonary Disease

## 2011-08-03 DIAGNOSIS — D649 Anemia, unspecified: Secondary | ICD-10-CM

## 2011-08-03 DIAGNOSIS — I059 Rheumatic mitral valve disease, unspecified: Secondary | ICD-10-CM

## 2011-08-03 DIAGNOSIS — R55 Syncope and collapse: Secondary | ICD-10-CM

## 2011-08-03 LAB — CARDIAC PANEL(CRET KIN+CKTOT+MB+TROPI)
CK, MB: 8.3 ng/mL (ref 0.3–4.0)
Relative Index: 2.8 — ABNORMAL HIGH (ref 0.0–2.5)
Total CK: 191 U/L — ABNORMAL HIGH (ref 7–177)
Total CK: 191 U/L — ABNORMAL HIGH (ref 7–177)

## 2011-08-03 LAB — BASIC METABOLIC PANEL
BUN: 28 mg/dL — ABNORMAL HIGH (ref 6–23)
CO2: 28 mEq/L (ref 19–32)
Calcium: 9.6 mg/dL (ref 8.4–10.5)
GFR calc non Af Amer: 60 mL/min — ABNORMAL LOW (ref >90)
Glucose, Bld: 107 mg/dL — ABNORMAL HIGH (ref 70–99)

## 2011-08-03 LAB — CBC
MCH: 28.3 pg (ref 26.0–34.0)
MCHC: 32.3 g/dL (ref 30.0–36.0)
MCV: 87.7 fL (ref 78.0–100.0)
Platelets: 337 10*3/uL (ref 150–400)

## 2011-08-03 MED ORDER — LISINOPRIL 5 MG PO TABS
5.0000 mg | ORAL_TABLET | Freq: Every day | ORAL | Status: DC
  Filled 2011-08-03: qty 1

## 2011-08-03 MED ORDER — ACETAMINOPHEN 325 MG PO TABS
650.0000 mg | ORAL_TABLET | Freq: Four times a day (QID) | ORAL | Status: DC | PRN
  Administered 2011-08-03 – 2011-08-07 (×7): 650 mg via ORAL
  Filled 2011-08-03 (×7): qty 2

## 2011-08-03 MED ORDER — HYDRALAZINE HCL 10 MG PO TABS
10.0000 mg | ORAL_TABLET | Freq: Four times a day (QID) | ORAL | Status: DC | PRN
  Administered 2011-08-03 – 2011-08-05 (×3): 10 mg via ORAL
  Filled 2011-08-03 (×4): qty 1

## 2011-08-03 MED ORDER — HYDROCODONE-ACETAMINOPHEN 5-325 MG PO TABS
1.0000 | ORAL_TABLET | Freq: Once | ORAL | Status: AC
  Administered 2011-08-03: 1 via ORAL
  Filled 2011-08-03: qty 1

## 2011-08-03 NOTE — Evaluation (Signed)
Physical Therapy Evaluation Patient Details Name: Katie Galvan MRN: [id number] DOB: 02-01-1927 Today's Date: 08/03/2011 Time: 1050-1110 PT Time Calculation (min): 20 min  PT Assessment / Plan / Recommendation Clinical Impression  78 yo admitted with recently repeating passing out and falling  and UTI and how has extensive past histroy.  She is dyspneic with all mobility and needs mod assist for sit to stand and ambulation with RW.  Anticipate she will need 24/7 care initially at d/c and will benefit from ST SNF    PT Assessment  Patient needs continued PT services    Follow Up Recommendations  Skilled nursing facility    Barriers to Discharge        Equipment Recommendations  Defer to next venue    Recommendations for Other Services OT consult   Frequency Min 3X/week    Precautions / Restrictions Precautions Precautions: Fall Precaution Comments: pt uses O2 24/7 Restrictions Weight Bearing Restrictions: No   Pertinent Vitals/Pain Nasal O2 maintained.  Pt dyspneic with all mobility      Mobility  Bed Mobility Bed Mobility: Rolling Left;Left Sidelying to Sit;Supine to Sit;Sit to Supine Rolling Left: 4: Min assist Left Sidelying to Sit: 4: Min assist Supine to Sit: 4: Min assist Sit to Supine: 4: Min assist Details for Bed Mobility Assistance: needs assit to move legs on hospital mattress Transfers Transfers: Sit to Stand;Stand to Sit Sit to Stand: 3: Mod assist Stand to Sit: 4: Min assist Ambulation/Gait Ambulation/Gait Assistance: 3: Mod assist Ambulation Distance (Feet): 4 Feet Assistive device: Rolling walker Ambulation/Gait Assistance Details: pt walked 4 feet forward and back as she had to return to bed for a procedure.  She has difficulty walking backward and needed significant balance assist. Gait Pattern: Step-to pattern;Decreased step length - right;Decreased step length - left;Trunk flexed Gait velocity: decreased General Gait Details: pt had dyspnea  with any activity despite nasal O2 Stairs: No Wheelchair Mobility Wheelchair Mobility: No    Exercises     PT Diagnosis: Difficulty walking;Abnormality of gait;Generalized weakness  PT Problem List: Decreased strength;Decreased activity tolerance;Decreased mobility;Decreased balance PT Treatment Interventions: DME instruction;Gait training;Functional mobility training;Therapeutic activities;Therapeutic exercise   PT Goals Acute Rehab PT Goals PT Goal Formulation: With patient Time For Goal Achievement: 08/17/11 Potential to Achieve Goals: Good Pt will go Supine/Side to Sit: Independently PT Goal: Supine/Side to Sit - Progress: Goal set today Pt will go Sit to Stand: with modified independence PT Goal: Sit to Stand - Progress: Goal set today Pt will Ambulate: 51 - 150 feet;with modified independence;with least restrictive assistive device PT Goal: Ambulate - Progress: Goal set today  Visit Information  Last PT Received On: 08/03/11 Assistance Needed: +2    Subjective Data  Subjective: "I woke up on the bathroom floor" Patient Stated Goal: to go back to her apt   Prior Functioning  Home Living Lives With: Alone Available Help at Discharge: Available PRN/intermittently Type of Home: Apartment Home Access: Level entry Home Adaptive Equipment: Dan Humphreys - four wheeled Prior Function Level of Independence: Independent with assistive device(s) Communication Communication: No difficulties    Cognition  Overall Cognitive Status: Appears within functional limits for tasks assessed/performed Arousal/Alertness: Awake/alert Orientation Level: Appears intact for tasks assessed Behavior During Session: [facility] for tasks performed    Extremity/Trunk Assessment Right Lower Extremity Assessment RLE ROM/Strength/Tone: [facility] for tasks assessed Left Lower Extremity Assessment LLE ROM/Strength/Tone: [facility] for tasks assessed Trunk Assessment Trunk Assessment: Kyphotic;Other exceptions Trunk  Exceptions: generalized muscle atrophy   Balance Balance Balance  Assessed: No  End of Session PT - End of Session Equipment Utilized During Treatment: Oxygen Activity Tolerance: Patient limited by fatigue Patient left: in bed;with call bell/phone within reach Nurse Communication: Mobility status  GP     Donnetta Hail 08/03/2011, 1:19 PM

## 2011-08-03 NOTE — Progress Notes (Signed)
[facility] on call notified of elevated MB at 13. Troponin negative. Pt had fall PTA. No cardiac symptoms. Ginny Forth

## 2011-08-03 NOTE — Progress Notes (Signed)
[facility] PROGRESS NOTE  Katie Galvan ZOX:[id number] DOB: May 09, 1927 DOA: 08/02/2011 PCP: Michele Mcalpine, MD  Assessment/Plan:  Principal Problem:  Syncope Likely vasovagal. Patient not a very good historian but informs having abrupt syncopal episodes usually on standing and trying to ambulate and are of a very short duration without any prodromal symptoms, no seizure likely activity or any weakness.  Head CT unremarkable  she is noted to be bradycardic to high 40s as well Serial cardiac enzymes negative Beta blocker held on admission  head CT and carotid doppler unremarkable  2D echo pending Check orthostasis Continue tele monitoring Will get lebeaur cardiology consult following 2D echo  Active Problems:   HYPERTENSION Resume home meds. Hold metoprolol   UTI (lower urinary tract infection) continune rocephin  follow urine cx   Chronic respiratory failure with hypoxia  History of restrictive lung ds Cont o2 via [place] Stable Continue advair and spiriva   Chronic pains syndrome  continue MS contin   Diastolic CHF, chronic euvolemic  continue lasix, aldactone and lisinopril. Holding BB  pending repeat echo     Code Status: full  Disposition Plan:SNF as per PT   Brief narrative: 78 Y/O Female with hx of HTN, PVD, chronic pain, anemia, hx of CHF ( last echo in 2012 with EF of 45%)  Consultants:  none  Procedures:  2 d echo, carotid doppler  Antibiotics:  Ceftriaxone ( day 2)  HPI/Subjective: Denies any dizziness, chest pain or palpitations  Objective: Filed Vitals:   08/02/11 2225 08/02/11 2228 08/03/11 0516 08/03/11 1050  BP:   154/75 162/94  Pulse:   58   Temp:   98 F (36.7 C)   TempSrc:   Oral   Resp:   22   Height:      Weight:   68.1 kg (150 lb 2.1 oz)   SpO2: 96% 96% 93%    No intake or output data in the 24 hours ending 08/03/11 1415  Exam:   General: elderly female in NAD  HEENT: no pallor, moist oral  mucosa  Cardiovascular: NS1&S2 , bradycardic, no murmurs  Respiratory: clear b/l, no added sounds  Abdomen: soft, NT, ND, BS+  EXT: warm, no edema   CNS: AAOx3, non focal  Data Reviewed: Basic Metabolic Panel:  Lab 08/03/11 8119 08/02/11 1630 07/30/11 1255  NA 140 138 138  K 4.1 4.2 4.2  CL 104 100 95*  CO2 28 28 27   GLUCOSE 107* 95 97  BUN 28* 35* 30*  CREATININE 0.87 1.03 1.29*  CALCIUM 9.6 9.8 10.0  MG -- -- --  PHOS -- -- --   Liver Function Tests: No results found for this basename: AST:5,ALT:5,ALKPHOS:5,BILITOT:5,PROT:5,ALBUMIN:5 in the last 168 hours No results found for this basename: LIPASE:5,AMYLASE:5 in the last 168 hours No results found for this basename: AMMONIA:5 in the last 168 hours CBC:  Lab 08/03/11 0303 08/02/11 1630 07/30/11 1255  WBC 9.8 12.1* 13.5*  NEUTROABS -- 9.3* --  HGB 10.4* 10.8* 11.7*  HCT 32.2* 33.8* 36.0  MCV 87.7 87.6 87.4  PLT 337 345 289   Cardiac Enzymes:  Lab 08/03/11 0503 08/02/11 2235 08/02/11 1630 07/30/11 1300  CKTOTAL 191* 314* 509* --  CKMB 8.3* 13.0* -- --  CKMBINDEX -- -- -- --  TROPONINI <0.30 <0.30 <0.30 <0.30   BNP (last 3 results)  Basename 08/02/11 1630 05/05/11 1553 12/07/10 1657  PROBNP 1420.0* 368.0* 119.0*   CBG: No results found for this basename: GLUCAP:5 in the last 168 hours  Recent Results (from the past 240 hour(s))  URINE CULTURE     Status: Normal   Collection Time   07/30/11  2:18 PM      Component Value Range Status Comment   Specimen Description URINE, CLEAN CATCH   Final    Special Requests NONE   Final    Culture  Setup Time 07/30/2011 21:20   Final    Colony Count NO GROWTH   Final    Culture NO GROWTH   Final    Report Status 08/01/2011 FINAL   Final      Studies: Dg Chest 2 View  08/02/2011  *RADIOLOGY REPORT*  Clinical Data: Fall and increased shortness of breath.  CHEST - 2 VIEW  Comparison: 07/30/2011  Findings: There is stable elevation of the right hemidiaphragm and  marked volume loss in the right lung.  Left lung remains clear.  No evidence for a pneumothorax.  Heart and mediastinum are stable.  No evidence for pleural effusions.  IMPRESSION: No acute findings.  Chronic elevation of the right hemidiaphragm.  Original Report Authenticated By: Richarda Overlie, M.D.   Ct Head Wo Contrast  07/30/2011  *RADIOLOGY REPORT*  Clinical Data: Fall  CT HEAD WITHOUT CONTRAST  Technique:  Contiguous axial images were obtained from the base of the skull through the vertex without contrast.  Comparison: CT 03/13/2010  Findings: Generalized atrophy is unchanged.  Chronic microvascular ischemia in the white matter is unchanged.  Negative for acute infarct.  Negative for hemorrhage or mass. Negative for skull fracture.  IMPRESSION: Atrophy and chronic microvascular ischemia.  No acute infarct or hemorrhage.  Original Report Authenticated By: Camelia Phenes, M.D.   Dg Chest Port 1 View  07/30/2011  *RADIOLOGY REPORT*  Clinical Data: Shortness of breath, fever and cough.  PORTABLE CHEST - 1 VIEW  Comparison: 05/05/2011.  Findings: The cardiac silhouette, mediastinal and hilar contours are stable.  There is tortuosity and calcification of the thoracic aorta.  Stable marked elevation of the right hemidiaphragm and chronic bronchitic type lung changes but no infiltrates, edema or effusions.  The bony thorax is intact.  IMPRESSION: Chronic lung changes but no acute pulmonary findings.  Original Report Authenticated By: P. Loralie Champagne, M.D.    Scheduled Meds:   . aspirin EC  81 mg Oral Daily  . cefTRIAXone (ROCEPHIN)  IV  1 g Intravenous Once  . cefTRIAXone (ROCEPHIN)  IV  1 g Intravenous Q24H  . cholecalciferol  2,000 Units Oral Daily  . citalopram  20 mg Oral Daily  . ferrous sulfate  325 mg Oral BID  . Fluticasone-Salmeterol  1 puff Inhalation Q12H  . furosemide  40 mg Oral Daily  . lisinopril  2.5 mg Oral Daily  . morphine  15 mg Oral Daily  . morphine  30 mg Oral QPM  .  multivitamin with minerals  1 tablet Oral Daily  . sodium chloride  3 mL Intravenous Q12H  . sodium chloride  3 mL Intravenous Q12H  . spironolactone  12.5 mg Oral Daily  . tiotropium  18 mcg Inhalation Daily  . vitamin C  500 mg Oral Daily  . DISCONTD: aspirin  81 mg Oral Daily  . DISCONTD: morphine  30 mg Oral Daily   Continuous Infusions:     Time spent: 35 minutes    Granvil Djordjevic  [facility] Pager [phone]. If 8PM-8AM, please contact night-coverage at [email or web address], password [facility] 08/03/2011, 2:15 PM  LOS: 1 day

## 2011-08-03 NOTE — Progress Notes (Signed)
VASCULAR LAB PRELIMINARY  PRELIMINARY  PRELIMINARY  PRELIMINARY  Carotid duplex completed.    Preliminary report:  Bilateral:  No evidence of hemodynamically significant internal carotid artery stenosis.   Vertebral artery flow is antegrade.     Miner Koral, RVS 08/03/2011, 9:25 AM

## 2011-08-04 ENCOUNTER — Encounter (HOSPITAL_COMMUNITY): Payer: Self-pay | Admitting: Physician Assistant

## 2011-08-04 DIAGNOSIS — R55 Syncope and collapse: Secondary | ICD-10-CM

## 2011-08-04 DIAGNOSIS — N390 Urinary tract infection, site not specified: Secondary | ICD-10-CM

## 2011-08-04 LAB — URINE CULTURE: Colony Count: >=100000

## 2011-08-04 LAB — BASIC METABOLIC PANEL
BUN: 21 mg/dL (ref 6–23)
CO2: 29 mEq/L (ref 19–32)
Calcium: 9.7 mg/dL (ref 8.4–10.5)
Creatinine, Ser: 0.86 mg/dL (ref 0.50–1.10)
GFR calc non Af Amer: 61 mL/min — ABNORMAL LOW (ref >90)
Glucose, Bld: 94 mg/dL (ref 70–99)
Sodium: 141 mEq/L (ref 135–145)

## 2011-08-04 LAB — MAGNESIUM: Magnesium: 1.7 mg/dL (ref 1.5–2.5)

## 2011-08-04 LAB — CBC
MCH: 27.7 pg (ref 26.0–34.0)
MCHC: 31.9 g/dL (ref 30.0–36.0)
MCV: 87 fL (ref 78.0–100.0)
Platelets: 399 10*3/uL (ref 150–400)
RDW: 14.4 % (ref 11.5–15.5)

## 2011-08-04 MED ORDER — MAGNESIUM SULFATE IN D5W 10-5 MG/ML-% IV SOLN
1.0000 g | Freq: Once | INTRAVENOUS | Status: AC
  Administered 2011-08-04: 1 g via INTRAVENOUS
  Filled 2011-08-04: qty 100

## 2011-08-04 MED ORDER — LISINOPRIL 20 MG PO TABS
20.0000 mg | ORAL_TABLET | Freq: Every day | ORAL | Status: DC
  Administered 2011-08-04 – 2011-08-07 (×4): 20 mg via ORAL
  Filled 2011-08-04 (×5): qty 1

## 2011-08-04 MED ORDER — MORPHINE SULFATE ER 15 MG PO TBCR
15.0000 mg | EXTENDED_RELEASE_TABLET | Freq: Two times a day (BID) | ORAL | Status: DC
  Administered 2011-08-04 – 2011-08-07 (×6): 15 mg via ORAL
  Filled 2011-08-04 (×6): qty 1

## 2011-08-04 NOTE — Progress Notes (Signed)
Pt had 4 beats of V. Tach on heart monitor. NP- K. Schorr notified. Magnesium level ordered. VS 187/88, 98% 1.5L O2, 57,  97.6. Will continue to monitor. Newman Nip Elgin

## 2011-08-04 NOTE — Social Work (Signed)
Clinical Social Work Department BRIEF PSYCHOSOCIAL ASSESSMENT 08/04/2011  Patient:  Katie Galvan,Katie Galvan     Account Number:  [id number]     Admit date:  08/02/2011  Clinical Social Worker:  Eddie Candle  Date/Time:  08/04/2011 01:00 PM  Referred by:  Physician  Date Referred:  08/04/2011 Referred for  SNF Placement   Other Referral:   Interview type:  Patient Other interview type:    PSYCHOSOCIAL DATA Living Status:  FACILITY Admitted from facility:  [facility] Level of care:  Independent Living Primary support name:  Casimiro Needle Primary support relationship to patient:  CHILD, ADULT Degree of support available:   Good    CURRENT CONCERNS Current Concerns  Post-Acute Placement   Other Concerns:    SOCIAL WORK ASSESSMENT / PLAN Patient was at [facility] in assisted Living.  Patient understands that she will have to have follow-up ST SNF placement and would prefer to go to [facility] for ST SNF because she has been there before and the transition would be easier.  CSW will initiate documentation for transition to SNF at d/c.   Assessment/plan status:  Information/Referral to [facility] Other assessment/ plan:   Information/referral to community resources:   faxed out to facilities    PATIENT'S/FAMILY'S RESPONSE TO PLAN OF CARE: Patient is in agreement but awaiting to hear from son.

## 2011-08-04 NOTE — Social Work (Signed)
Clinical Social Work Department CLINICAL SOCIAL WORK PLACEMENT NOTE 08/04/2011  Patient:  Stthomas,Vali  Account Number:  [id number] Admit date:  08/02/2011  Clinical Social Worker:  Nathen May, LCSW  Date/time:  08/04/2011 01:00 PM  Clinical Social Work is seeking post-discharge placement for this patient at the following level of care:   SKILLED NURSING   (*CSW will update this form in Epic as items are completed)   08/04/2011  Patient/family provided with [facility] Department of Clinical Social Work's list of facilities offering this level of care within the geographic area requested by the patient (or if unable, by the patient's family).  08/04/2011  Patient/family informed of their freedom to choose among providers that offer the needed level of care, that participate in Medicare, Medicaid or managed care program needed by the patient, have an available bed and are willing to accept the patient.    Patient/family informed of [facility]' ownership interest in [facility], as well as of the fact that they are under no obligation to receive care at this facility.  PASARR submitted to EDS on  PASARR number received from EDS on   FL2 transmitted to all facilities in geographic area requested by pt/family on   FL2 transmitted to all facilities within larger geographic area on   Patient informed that his/her managed care company has contracts with or will negotiate with  certain facilities, including the following:     Patient/family informed of bed offers received:   Patient chooses bed at  Physician recommends and patient chooses bed at    Patient to be transferred to  on   Patient to be transferred to facility by   The following physician request were entered in Epic:   Additional Comments:

## 2011-08-04 NOTE — Progress Notes (Signed)
[facility] PROGRESS NOTE  Katie Galvan:[id number] DOB: 18-Oct-1927 DOA: 08/02/2011 PCP: Michele Mcalpine, MD  Assessment/Plan:   Principal Problem:  Syncope  Likely vasovagal. Patient not a very good historian but informs having abrupt syncopal episodes usually on standing and trying to ambulate and are of a very short duration without any prodromal symptoms, no seizure likely activity or any weakness.  Head CT unremarkable  she is noted to be bradycardic to high 40s as well  Serial cardiac enzymes negative  Beta blocker held on admission  head CT and carotid doppler unremarkable  2D echo shows normal EF -HR now stable on tele  appreciate cardiology recs. Recommend loop recorder monitoring as outpatient.a lso recommend to reduce MScontin dose at bedtime to avoid morning or night time orthostasis. Will reduce pm dose of MScontin to 15 mg   Active Problems:  HYPERTENSION  Resume home meds. started low dose coreg   UTI (lower urinary tract infection)  continune rocephin ( day ) follow urine cx growing ecoli , sensitive to rocephin  Chronic respiratory failure with hypoxia  History of restrictive lung ds  Cont o2 via [place]  Stable  Continue advair and spiriva   Chronic pains syndrome  continue MS contin ( dose reduced to 15 mg bid)  Diastolic CHF, chronic  euvolemic  continue lasix, aldactone and lisinopril. Low dose coereg EF normal, gr 1 diastolic dysfn   Code Status: full  Disposition Plan:SNF as per PT   Brief narrative:  78 Y/O Female with hx of HTN, PVD, chronic pain, anemia, hx of CHF ( last echo in 2012 with EF of 45%)  Consultants:  Dr Patty Sermons  Procedures:  2 d echo, carotid doppler Antibiotics:  Ceftriaxone ( day 2) HPI/Subjective:  Denies any dizziness, chest pain or palpitations  Objective: Filed Vitals:   08/04/11 0618 08/04/11 0730 08/04/11 0753 08/04/11 1358  BP: 181/100 164/91  146/83  Pulse: 61 59  64  Temp:    97.5 F (36.4 C)    TempSrc:    Oral  Resp:    16  Height:      Weight: 65.7 kg (144 lb 13.5 oz)     SpO2: 93%  91% 96%    Intake/Output Summary (Last 24 hours) at 08/04/11 1613 Last data filed at 08/04/11 1241  Gross per 24 hour  Intake    480 ml  Output    600 ml  Net   -120 ml    Exam:  General: elderly female in NAD  HEENT: no pallor, moist oral mucosa  Cardiovascular: NS1&S2 , bradycardic, no murmurs  Respiratory: clear b/l, no added sounds  Abdomen: soft, NT, ND, BS+  EXT: warm, no edema  CNS: AAOx3, non focal   Data Reviewed: Basic Metabolic Panel:  Lab 08/04/11 8119 08/04/11 0725 08/03/11 0303 08/02/11 1630 07/30/11 1255  NA 141 -- 140 138 138  K 3.5 -- 4.1 4.2 4.2  CL 102 -- 104 100 95*  CO2 29 -- 28 28 27   GLUCOSE 94 -- 107* 95 97  BUN 21 -- 28* 35* 30*  CREATININE 0.86 -- 0.87 1.03 1.29*  CALCIUM 9.7 -- 9.6 9.8 10.0  MG -- 1.7 -- -- --  PHOS -- -- -- -- --   Liver Function Tests: No results found for this basename: AST:5,ALT:5,ALKPHOS:5,BILITOT:5,PROT:5,ALBUMIN:5 in the last 168 hours No results found for this basename: LIPASE:5,AMYLASE:5 in the last 168 hours No results found for this basename: AMMONIA:5 in the last 168 hours CBC:  Lab 08/04/11 0730 08/03/11 0303 08/02/11 1630 07/30/11 1255  WBC 10.1 9.8 12.1* 13.5*  NEUTROABS -- -- 9.3* --  HGB 11.5* 10.4* 10.8* 11.7*  HCT 36.1 32.2* 33.8* 36.0  MCV 87.0 87.7 87.6 87.4  PLT 399 337 345 289   Cardiac Enzymes:  Lab 08/03/11 1319 08/03/11 0503 08/02/11 2235 08/02/11 1630 07/30/11 1300  CKTOTAL 191* 191* 314* 509* --  CKMB 5.3* 8.3* 13.0* -- --  CKMBINDEX -- -- -- -- --  TROPONINI <0.30 <0.30 <0.30 <0.30 <0.30   BNP (last 3 results)  Basename 08/02/11 1630 05/05/11 1553 12/07/10 1657  PROBNP 1420.0* 368.0* 119.0*   CBG: No results found for this basename: GLUCAP:5 in the last 168 hours  Recent Results (from the past 240 hour(s))  URINE CULTURE     Status: Normal   Collection Time   07/30/11  2:18 PM       Component Value Range Status Comment   Specimen Description URINE, CLEAN CATCH   Final    Special Requests NONE   Final    Culture  Setup Time 07/30/2011 21:20   Final    Colony Count NO GROWTH   Final    Culture NO GROWTH   Final    Report Status 08/01/2011 FINAL   Final   URINE CULTURE     Status: Normal   Collection Time   08/02/11  5:47 PM      Component Value Range Status Comment   Specimen Description URINE, RANDOM   Final    Special Requests NONE   Final    Culture  Setup Time 08/03/2011 05:49   Final    Colony Count >=100,000 COLONIES/ML   Final    Culture ESCHERICHIA COLI   Final    Report Status 08/04/2011 FINAL   Final    Organism ID, Bacteria ESCHERICHIA COLI   Final      Studies: Dg Chest 2 View  08/02/2011  *RADIOLOGY REPORT*  Clinical Data: Fall and increased shortness of breath.  CHEST - 2 VIEW  Comparison: 07/30/2011  Findings: There is stable elevation of the right hemidiaphragm and marked volume loss in the right lung.  Left lung remains clear.  No evidence for a pneumothorax.  Heart and mediastinum are stable.  No evidence for pleural effusions.  IMPRESSION: No acute findings.  Chronic elevation of the right hemidiaphragm.  Original Report Authenticated By: Richarda Overlie, M.D.   Ct Head Wo Contrast  07/30/2011  *RADIOLOGY REPORT*  Clinical Data: Fall  CT HEAD WITHOUT CONTRAST  Technique:  Contiguous axial images were obtained from the base of the skull through the vertex without contrast.  Comparison: CT 03/13/2010  Findings: Generalized atrophy is unchanged.  Chronic microvascular ischemia in the white matter is unchanged.  Negative for acute infarct.  Negative for hemorrhage or mass. Negative for skull fracture.  IMPRESSION: Atrophy and chronic microvascular ischemia.  No acute infarct or hemorrhage.  Original Report Authenticated By: Camelia Phenes, M.D.   Dg Chest Port 1 View  07/30/2011  *RADIOLOGY REPORT*  Clinical Data: Shortness of breath, fever and cough.   PORTABLE CHEST - 1 VIEW  Comparison: 05/05/2011.  Findings: The cardiac silhouette, mediastinal and hilar contours are stable.  There is tortuosity and calcification of the thoracic aorta.  Stable marked elevation of the right hemidiaphragm and chronic bronchitic type lung changes but no infiltrates, edema or effusions.  The bony thorax is intact.  IMPRESSION: Chronic lung changes but no acute pulmonary findings.  Original  Report Authenticated By: P. Loralie Champagne, M.D.    Scheduled Meds:   . aspirin EC  81 mg Oral Daily  . cefTRIAXone (ROCEPHIN)  IV  1 g Intravenous Q24H  . cholecalciferol  2,000 Units Oral Daily  . citalopram  20 mg Oral Daily  . ferrous sulfate  325 mg Oral BID  . Fluticasone-Salmeterol  1 puff Inhalation Q12H  . furosemide  40 mg Oral Daily  . HYDROcodone-acetaminophen  1 tablet Oral Once  . lisinopril  20 mg Oral Daily  . magnesium sulfate 1 - 4 g bolus IVPB  1 g Intravenous Once  . morphine  15 mg Oral Q12H  . multivitamin with minerals  1 tablet Oral Daily  . sodium chloride  3 mL Intravenous Q12H  . sodium chloride  3 mL Intravenous Q12H  . spironolactone  12.5 mg Oral Daily  . tiotropium  18 mcg Inhalation Daily  . vitamin C  500 mg Oral Daily  . DISCONTD: lisinopril  2.5 mg Oral Daily  . DISCONTD: lisinopril  5 mg Oral Daily  . DISCONTD: morphine  15 mg Oral Daily  . DISCONTD: morphine  30 mg Oral QPM       Time spent: 30 minutes    Tobe Kervin  [facility] Pager [phone]. If 8PM-8AM, please contact night-coverage at [email or web address], password [facility] 08/04/2011, 4:13 PM  LOS: 2 days

## 2011-08-04 NOTE — Consult Note (Signed)
CARDIOLOGY CONSULT NOTE   Patient ID: Katie Galvan MRN: [id number] DOB/AGE: 1927/01/15 78 y.o.  Admit date: 08/02/2011  Primary Physician   Michele Mcalpine, MD Primary Cardiologist   DB/CHF Clinic Reason for Consultation    Syncope and NSVT/sinus brady  [name] is a 78 y.o. female with no history of CAD.  She has LVD diagnosed 10/12 during an acute illness and felt secondary to viral myocarditis/pericarditis. She has done well since then and has been followed in the CHF clinic (last visit 3/13). She was admitted with 3 syncopal episodes/falls. Bradycardia with heart rates in the 40s as well as NSVT have been seen on telemetry and cardiology was asked to evaluate her.   Of note, her EF was rechecked yesterday and is now normal.   Katie Galvan remembers last 3 syncopal episodes. Her family has told her about at least one other episode,  notably at her grandson's wedding but no diagnosis known. Katie Galvan does not remember any other falls. She admits that her memory is poor and she does not remember everything.   Katie Galvan remembers waking up on the floor early morning in May 2013. She does not know why she fell. The first time, she was seen by Dr [name] and no further workup was done. She fell again on 7/28. She was seen in the ER and released. She was taken to the ER on 7/31 for a fall that occured the night before. All 3 falls are unwitnessed and at least 2 have occurred during the night. Per reports, the patient did not complain of any specific symptoms or problems except Katie aches and pains (acute on chronic). After the 7/28 fall, her son reported labored speech and a HA. The ER notes labored speech that improved but no further workup was done.     Past Medical History  Diagnosis Date  . Disorders of diaphragm   . Unspecified essential hypertension   . Endocarditis, valve unspecified, unspecified cause   . Cardiac dysrhythmia, unspecified   . Unspecified venous (peripheral)  insufficiency   . Edema   . Pure hypercholesterolemia   . Other dysphagia   . Diverticulosis of colon (without mention of hemorrhage)   . Benign neoplasm of colon   . Osteoarthrosis, unspecified whether generalized or localized, unspecified site   . Lumbago   . Chronic pain syndrome   . Disorder of bone and cartilage, unspecified   . Depressive disorder, not elsewhere classified   . Anemia, unspecified   . Skin cancer   . Systolic heart failure     echo 10/10/10 EF 30% - secondary to viral myocarditis/pericarditis; no ischemic workup performed    Past Surgical History  Procedure Date  . Appendectomy   . Breast surgery   . Right wrist surgery   . Bilateral cataracts   . Hip surgery   . Tonsillectomy   . Adenoidectomy     Allergies  Allergen Reactions  . Sulfonamide Derivatives Swelling    I have reviewed the patient's current medications    . aspirin EC  81 mg Oral Daily  . cefTRIAXone (ROCEPHIN)  IV  1 g Intravenous Q24H  . cholecalciferol  2,000 Units Oral Daily  . citalopram  20 mg Oral Daily  . ferrous sulfate  325 mg Oral BID  . Fluticasone-Salmeterol  1 puff Inhalation Q12H  . furosemide  40 mg Oral Daily  . HYDROcodone-acetaminophen  1 tablet Oral Once  . lisinopril  20 mg Oral Daily  . magnesium sulfate  1 - 4 g bolus IVPB  1 g Intravenous Once  . morphine  15 mg Oral Daily  . morphine  30 mg Oral QPM  . multivitamin with minerals  1 tablet Oral Daily  . sodium chloride  3 mL Intravenous Q12H  . sodium chloride  3 mL Intravenous Q12H  . spironolactone  12.5 mg Oral Daily  . tiotropium  18 mcg Inhalation Daily  . vitamin C  500 mg Oral Daily     sodium chloride, acetaminophen, hydrALAZINE  Medication Sig  aspirin 81 MG EC tablet Take 81 mg by mouth daily.    Calcium Carbonate-Vit D-Min (CALTRATE 600+D PLUS) 600-400 MG-UNIT per tablet Chew 1 tablet by mouth 2 (two) times daily.   carvedilol (COREG) 6.25 MG tablet Take 1 tablet (6.25 mg total) by mouth 2  (two) times daily with a meal.  cholecalciferol (VITAMIN D) 1000 UNITS tablet Take 2,000 Units by mouth daily.  citalopram (CELEXA) 20 MG tablet Take 1 tablet (20 mg total) by mouth daily.  clotrimazole-betamethasone (LOTRISONE) cream Apply 1 application topically 2 (two) times daily as needed. For rashes on forehead.  ferrous sulfate 325 (65 FE) MG tablet Take 325 mg by mouth 2 (two) times daily.    Fluticasone-Salmeterol (ADVAIR DISKUS) 100-50 MCG/DOSE AEPB Inhale 1 puff into the lungs every 12 (twelve) hours.  furosemide (LASIX) 40 MG tablet Take 1 tablet (40 mg total) by mouth daily.  lisinopril (PRINIVIL,ZESTRIL) 2.5 MG tablet Take 1 tablet (2.5 mg total) by mouth daily.  loratadine (CLARITIN) 10 MG tablet Take 10 mg by mouth daily.    morphine (Katie Galvan) 15 MG 12 hr tablet Take 15 mg by mouth daily. In am.  morphine (Katie Galvan) 30 MG 12 hr tablet Take 30 mg by mouth daily. In pm.  Multiple Vitamin (MULTIVITAMIN WITH MINERALS) TABS Take 1 tablet by mouth daily.  spironolactone (ALDACTONE) 25 MG tablet Take 12.5 mg by mouth daily.   tiotropium (SPIRIVA) 18 MCG inhalation capsule Place 1 capsule (18 mcg total) into inhaler and inhale daily.  vitamin C (ASCORBIC ACID) 500 MG tablet Take 500 mg by mouth daily.    zolpidem (AMBIEN) 10 MG tablet Take 1 tablet (10 mg total) by mouth at bedtime as needed for sleep.   History   Social History  . Marital Status: Widowed    Spouse Name: N/A    Number of Children: 2  . Years of Education: N/A   Occupational History  . Retired  - worked at a [employer] sales [employer]    Social History Main Topics  . Smoking status: Never Smoker   . Smokeless tobacco: Never Used  . Alcohol Use: No  . Drug Use: No  . Sexually Active: Not on file   Other Topics Concern  . Not on file   Social History Narrative   Katie Galvan has independent apartment at [place].   Family History  Problem Relation Age of Onset  . Diabetes Mother   . Heart disease Father   . Lung cancer Sister        ROS:  Full 14 point review of systems complete and found to be negative unless listed above.  Physical Exam: Blood pressure 164/91, pulse 59, temperature 97.6 F (36.4 C), temperature source Oral, resp. rate 20, height 5\' 2"  (1.575 m), weight 144 lb 13.5 oz (65.7 kg), SpO2 91.00%.  General: Well developed, well nourished, female in no acute distress Head: Eyes PERRLA, No xanthomas.   Normocephalic and atraumatic, oropharynx without edema or  exudate. Dentition - poor Lungs: bilateral basilar rales Heart: HRRR S1 S2, no rub/gallop, 2-3/6 systolic murmur. pulses are 2+ all 4 extrem.   Neck: No carotid bruits. No lymphadenopathy.  JVD minimally elevated. Abdomen: Bowel sounds present, abdomen soft and non-tender without masses or hernias noted. Msk:  No spine or cva tenderness. No weakness, Left wrist joint deformity plus kyphosis, no effusions. Extremities: No clubbing or cyanosis. no edema.  Neuro: Alert and oriented X 3. No focal deficits noted. Poor memory for recent events. Psych:  Good affect, responds appropriately Skin: No rashes or lesions noted.  Labs:   Lab Results  Component Value Date   WBC 10.1 08/04/2011   HGB 11.5* 08/04/2011   HCT 36.1 08/04/2011   MCV 87.0 08/04/2011   PLT 399 08/04/2011     Lab 08/04/11 0730  NA 141  K 3.5  CL 102  CO2 29  BUN 21  CREATININE 0.86  CALCIUM 9.7  PROT --  BILITOT --  ALKPHOS --  ALT --  AST --  GLUCOSE 94   Magnesium  Date Value Range Status  08/04/2011 1.7  1.5 - 2.5 mg/dL Final    Basename 16/10/96 1319 08/03/11 0503 08/02/11 2235 08/02/11 1630 10/10/2010  CKTOTAL 191* 191* 314* 509* 131  CKMB 5.3* 8.3* 13.0* -- 11.8  CKMB Index 2.8 4.3 4.1  9.0  TROPONINI <0.30 <0.30 <0.30 <0.30 myo/pericarditis   Pro B Natriuretic peptide (BNP)  Date/Time Value Range Status  08/02/2011  4:30 PM 1420.0* 0 - 450 pg/mL Final  05/05/2011  3:53 PM 368.0* 0.0 - 100.0 pg/mL Final   Echo: 08/03/2011 Study Conclusions - Left ventricle: The  cavity size was normal. Wall thickness was increased in a pattern of mild LVH. Systolic function was vigorous. The estimated ejection fraction was in the range of 65% to 70%. Doppler parameters are consistent with abnormal left ventricular relaxation (grade 1 diastolic dysfunction). - Mitral valve: Significant calcification of posterior mitral annulus. Cannot completely exclude old infection in this region.Mildly thickened leaflets . Mild regurgitation. - Left atrium: The atrium was mildly dilated.  ECG: 30-Jul-2011 14:27:07   SINUS RHYTHM ~ normal P axis, V-rate 50- 99 LATERAL INFARCT, AGE INDETERMINATE ~ Q>55mS, T neg, I aVL V5 V6 no significant change since prior EKG Vent. rate 53 BPM PR interval 152 Katie QRS duration 86 Katie QT/QTc 416/390 Katie P-R-T axes 125 78 177  Radiology:  Dg Chest 2 View 08/02/2011  *RADIOLOGY REPORT*  Clinical Data: Fall and increased shortness of breath.  CHEST - 2 VIEW  Comparison: 07/30/2011  Findings: There is stable elevation of the right hemidiaphragm and marked volume loss in the right lung.  Left lung remains clear.  No evidence for a pneumothorax.  Heart and mediastinum are stable.  No evidence for pleural effusions.  IMPRESSION: No acute findings.  Chronic elevation of the right hemidiaphragm.  Original Report Authenticated By: Richarda Overlie, M.D.   Ct Head Wo Contrast 07/30/2011  *RADIOLOGY REPORT*  Clinical Data: Fall  CT HEAD WITHOUT CONTRAST  Technique:  Contiguous axial images were obtained from the base of the skull through the vertex without contrast.  Comparison: CT 03/13/2010  Findings: Generalized atrophy is unchanged.  Chronic microvascular ischemia in the white matter is unchanged.  Negative for acute infarct.  Negative for hemorrhage or mass. Negative for skull fracture.  IMPRESSION: Atrophy and chronic microvascular ischemia.  No acute infarct or hemorrhage.  Original Report Authenticated By: Camelia Phenes, M.D.   ASSESSMENT AND PLAN:  The patient  was seen today by Dr Patty Sermons, the patient evaluated and the data reviewed.   Sinus bradycardia - HR high 40s last pm but that should not cause unconsciousness. We can decrease Coreg dose and set up for event monitor as an outpatient, then f/u with MD.   Syncope - a source of concern is her home med regimen. If she is taking Katie Galvan 30 mg and Ambien 10 mg QHS, that may lead to falls/syncope. She rec'd the Katie Galvan 30 mg last pm but not the Ambien without problems. She had 4 bt NSVT during the night but that would not cause syncope. She has no history of palpitations and her EF is now normal. Event monitor should show if she has longer runs as OP.   Otherwise, per primary MD. Will continue to follow.  Principal Problem:  *Altered mental status Active Problems:  HYPERTENSION  UTI (lower urinary tract infection)  Chronic respiratory failure with hypoxia  Diastolic CHF, chronic  Bradycardia, sinus   Signed: Theodore Demark 08/04/2011, 1:05 PM Co-Sign MD Agree with assessment and plan as outlined above.  Her coreg will be reduced to 3.125 mg BID to allow a higher resting heart rate particularly at night. In addition we would suggest reducing the evening dose of Katie Galvan to decrease likelihood of nocturnal orthostatic hypotension when she gets up to void. So far she has not demonstrated any significant arrhythmias on telemetry. Anticipate she can be discharged in another 1-2 days if stable and then wear an event monitor at home. Exam today is notable for soft murmur of mitral regurgitation at apex. Lungs reveal minimal basilar rales.

## 2011-08-05 MED ORDER — CARVEDILOL 3.125 MG PO TABS
3.1250 mg | ORAL_TABLET | Freq: Two times a day (BID) | ORAL | Status: DC
  Administered 2011-08-05 – 2011-08-07 (×6): 3.125 mg via ORAL
  Filled 2011-08-05 (×7): qty 1

## 2011-08-05 NOTE — Progress Notes (Signed)
Physical Therapy Treatment Patient Details Name: Katie Galvan MRN: [id number] DOB: May 13, 1927 Today's Date: 08/05/2011 Time: 1046-1100 PT Time Calculation (min): 14 min  PT Assessment / Plan / Recommendation Comments on Treatment Session  Pt progressing well with increased amb distance.  continues to have DOE even with 2LO2 and O2 sats in low 90's during session.  Pt declined doing exercises in chair and states that she may be going to rehab today.     Follow Up Recommendations  Skilled nursing facility    Barriers to Discharge        Equipment Recommendations  Defer to next venue    Recommendations for Other Services    Frequency Min 3X/week   Plan Discharge plan remains appropriate    Precautions / Restrictions Precautions Precautions: Fall Precaution Comments: pt uses O2 24/7 Restrictions Weight Bearing Restrictions: No   Pertinent Vitals/Pain 5/10 shoulder pain    Mobility  Bed Mobility Bed Mobility: Supine to Sit Supine to Sit: 4: Min assist Details for Bed Mobility Assistance: Pt able to get LE's off EOB, however requires some assist for trunk due to pain in L shoulder.  cues for technique and hand placement.  Transfers Transfers: Sit to Stand;Stand to Sit Sit to Stand: 4: Min assist;3: Mod assist;With upper extremity assist;From bed Stand to Sit: 4: Min assist;With armrests;With upper extremity assist;To chair/3-in-1 Details for Transfer Assistance: Assist to rise and steady and ensure controlled descent with sitting.  Min cues for hand placement.  Ambulation/Gait Ambulation/Gait Assistance: 4: Min assist;3: Mod assist Ambulation Distance (Feet): 80 Feet Assistive device: Rolling walker Ambulation/Gait Assistance Details: Cues for positioning inside of RW and to perform pursed lip breathing to control RR. Pt ambulated on 2 LO2 via nasal cannula with O2 sats at 92% during ambulation.  Gait Pattern: Step-to pattern;Decreased step length - right;Decreased step  length - left;Trunk flexed Gait velocity: decreased General Gait Details: pt had dyspnea with any activity despite nasal O2    Exercises     PT Diagnosis:    PT Problem List:   PT Treatment Interventions:     PT Goals Acute Rehab PT Goals PT Goal Formulation: With patient Time For Goal Achievement: 08/17/11 Potential to Achieve Goals: Good Pt will go Supine/Side to Sit: Independently PT Goal: Supine/Side to Sit - Progress: Progressing toward goal Pt will go Sit to Stand: with modified independence PT Goal: Sit to Stand - Progress: Progressing toward goal Pt will Ambulate: 51 - 150 feet;with modified independence;with least restrictive assistive device PT Goal: Ambulate - Progress: Progressing toward goal  Visit Information  Last PT Received On: 08/05/11 Assistance Needed: +2    Subjective Data  Subjective: I just got cleaned up Patient Stated Goal: to go back to her apt   Cognition  Overall Cognitive Status: Appears within functional limits for tasks assessed/performed Arousal/Alertness: Awake/alert Orientation Level: Appears intact for tasks assessed Behavior During Session: [facility] for tasks performed    Balance     End of Session PT - End of Session Equipment Utilized During Treatment: Oxygen Activity Tolerance: Patient limited by fatigue Patient left: in chair;with call bell/phone within reach;with chair alarm set Nurse Communication: Mobility status   GP     Page, Meribeth Mattes 08/05/2011, 11:54 AM

## 2011-08-05 NOTE — Progress Notes (Signed)
[facility] PROGRESS NOTE  Dae Antonucci ONG:[id number] DOB: May 06, 1927 DOA: 08/02/2011 PCP: Michele Mcalpine, MD  Assessment/Plan:   Principal Problem:  Syncope  Likely vasovagal. Patient not a very good historian but informs having abrupt syncopal episodes usually on standing and trying to ambulate and are of a very short duration without any prodromal symptoms, no seizure likely activity or any weakness.  Head CT unremarkable  she was noted to be bradycardic to high 40s as well  Serial cardiac enzymes negative  Beta blocker held on admission  head CT and carotid doppler unremarkable  2D echo shows normal EF  -HR now stable on tele  appreciate cardiology recs. Recommend loop recorder monitoring as outpatient.also recommend to reduce MScontin dose at bedtime to avoid morning or night time orthostasis. -reduced  MS contin dose to 15 mg bid.  Active Problems:   HYPERTENSION  Resume home meds. started low dose coreg   UTI (lower urinary tract infection)  continune rocephin ( day 3 )  follow urine cx growing ecoli , sensitive to rocephin   Chronic respiratory failure with hypoxia  History of restrictive lung ds  Cont o2 via [place]  Stable  Continue advair and spiriva   Chronic pains syndrome  continue MS contin ( dose reduced to 15 mg bid)   Diastolic CHF, chronic  euvolemic  continue lasix, aldactone and lisinopril. Low dose coereg  EF normal, gr 1 diastolic dysfn   Code Status: full  Disposition Plan:SNF likely on Monday ( [facility] does not take new pt during the weekend)  Brief narrative:  78 Y/O Female with hx of HTN, PVD, chronic pain, anemia, hx of CHF ( last echo in 2012 with EF of 45%)  Consultants:  Dr Patty Sermons  Procedures:  2 d echo, carotid doppler Antibiotics:  Ceftriaxone ( day 2) HPI/Subjective:  Denies any dizziness, chest pain or palpitations   Objective: Filed Vitals:   08/05/11 0700 08/05/11 0724 08/05/11 0831 08/05/11 1321  BP:   171/78  157/83  Pulse:  75  86  Temp:  98 F (36.7 C)  97.6 F (36.4 C)  TempSrc:  Oral  Oral  Resp:  16  20  Height:      Weight: 65.2 kg (143 lb 11.8 oz)     SpO2:  98% 94% 95%    Intake/Output Summary (Last 24 hours) at 08/05/11 1325 Last data filed at 08/05/11 1300  Gross per 24 hour  Intake    743 ml  Output    700 ml  Net     43 ml    Exam: General: elderly female in NAD  HEENT: no pallor, moist oral mucosa  Cardiovascular: NS1&S2 , bradycardic, no murmurs  Respiratory: clear b/l, no added sounds  Abdomen: soft, NT, ND, BS+  EXT: warm, no edema  CNS: AAOx3, non focal  Data Reviewed: Basic Metabolic Panel:  Lab 08/04/11 4401 08/04/11 0725 08/03/11 0303 08/02/11 1630 07/30/11 1255  NA 141 -- 140 138 138  K 3.5 -- 4.1 4.2 4.2  CL 102 -- 104 100 95*  CO2 29 -- 28 28 27   GLUCOSE 94 -- 107* 95 97  BUN 21 -- 28* 35* 30*  CREATININE 0.86 -- 0.87 1.03 1.29*  CALCIUM 9.7 -- 9.6 9.8 10.0  MG -- 1.7 -- -- --  PHOS -- -- -- -- --   Liver Function Tests: No results found for this basename: AST:5,ALT:5,ALKPHOS:5,BILITOT:5,PROT:5,ALBUMIN:5 in the last 168 hours No results found for this basename: LIPASE:5,AMYLASE:5  in the last 168 hours No results found for this basename: AMMONIA:5 in the last 168 hours CBC:  Lab 08/04/11 0730 08/03/11 0303 08/02/11 1630 07/30/11 1255  WBC 10.1 9.8 12.1* 13.5*  NEUTROABS -- -- 9.3* --  HGB 11.5* 10.4* 10.8* 11.7*  HCT 36.1 32.2* 33.8* 36.0  MCV 87.0 87.7 87.6 87.4  PLT 399 337 345 289   Cardiac Enzymes:  Lab 08/03/11 1319 08/03/11 0503 08/02/11 2235 08/02/11 1630 07/30/11 1300  CKTOTAL 191* 191* 314* 509* --  CKMB 5.3* 8.3* 13.0* -- --  CKMBINDEX -- -- -- -- --  TROPONINI <0.30 <0.30 <0.30 <0.30 <0.30   BNP (last 3 results)  Basename 08/02/11 1630 05/05/11 1553 12/07/10 1657  PROBNP 1420.0* 368.0* 119.0*   CBG: No results found for this basename: GLUCAP:5 in the last 168 hours  Recent Results (from the past 240 hour(s))   URINE CULTURE     Status: Normal   Collection Time   07/30/11  2:18 PM      Component Value Range Status Comment   Specimen Description URINE, CLEAN CATCH   Final    Special Requests NONE   Final    Culture  Setup Time 07/30/2011 21:20   Final    Colony Count NO GROWTH   Final    Culture NO GROWTH   Final    Report Status 08/01/2011 FINAL   Final   URINE CULTURE     Status: Normal   Collection Time   08/02/11  5:47 PM      Component Value Range Status Comment   Specimen Description URINE, RANDOM   Final    Special Requests NONE   Final    Culture  Setup Time 08/03/2011 05:49   Final    Colony Count >=100,000 COLONIES/ML   Final    Culture ESCHERICHIA COLI   Final    Report Status 08/04/2011 FINAL   Final    Organism ID, Bacteria ESCHERICHIA COLI   Final      Studies: Dg Chest 2 View  08/02/2011  *RADIOLOGY REPORT*  Clinical Data: Fall and increased shortness of breath.  CHEST - 2 VIEW  Comparison: 07/30/2011  Findings: There is stable elevation of the right hemidiaphragm and marked volume loss in the right lung.  Left lung remains clear.  No evidence for a pneumothorax.  Heart and mediastinum are stable.  No evidence for pleural effusions.  IMPRESSION: No acute findings.  Chronic elevation of the right hemidiaphragm.  Original Report Authenticated By: Richarda Overlie, M.D.   Ct Head Wo Contrast  07/30/2011  *RADIOLOGY REPORT*  Clinical Data: Fall  CT HEAD WITHOUT CONTRAST  Technique:  Contiguous axial images were obtained from the base of the skull through the vertex without contrast.  Comparison: CT 03/13/2010  Findings: Generalized atrophy is unchanged.  Chronic microvascular ischemia in the white matter is unchanged.  Negative for acute infarct.  Negative for hemorrhage or mass. Negative for skull fracture.  IMPRESSION: Atrophy and chronic microvascular ischemia.  No acute infarct or hemorrhage.  Original Report Authenticated By: Camelia Phenes, M.D.   Dg Chest Port 1 View  07/30/2011   *RADIOLOGY REPORT*  Clinical Data: Shortness of breath, fever and cough.  PORTABLE CHEST - 1 VIEW  Comparison: 05/05/2011.  Findings: The cardiac silhouette, mediastinal and hilar contours are stable.  There is tortuosity and calcification of the thoracic aorta.  Stable marked elevation of the right hemidiaphragm and chronic bronchitic type lung changes but no infiltrates, edema or  effusions.  The bony thorax is intact.  IMPRESSION: Chronic lung changes but no acute pulmonary findings.  Original Report Authenticated By: P. Loralie Champagne, M.D.    Scheduled Meds:   . aspirin EC  81 mg Oral Daily  . carvedilol  3.125 mg Oral BID WC  . cefTRIAXone (ROCEPHIN)  IV  1 g Intravenous Q24H  . cholecalciferol  2,000 Units Oral Daily  . citalopram  20 mg Oral Daily  . ferrous sulfate  325 mg Oral BID  . Fluticasone-Salmeterol  1 puff Inhalation Q12H  . furosemide  40 mg Oral Daily  . lisinopril  20 mg Oral Daily  . morphine  15 mg Oral Q12H  . multivitamin with minerals  1 tablet Oral Daily  . sodium chloride  3 mL Intravenous Q12H  . sodium chloride  3 mL Intravenous Q12H  . spironolactone  12.5 mg Oral Daily  . tiotropium  18 mcg Inhalation Daily  . vitamin C  500 mg Oral Daily  . DISCONTD: morphine  15 mg Oral Daily  . DISCONTD: morphine  30 mg Oral QPM   Continuous Infusions:     Time spent: 30 minutes    Eyanna Mcgonagle  [facility] Pager [phone]. If 8PM-8AM, please contact night-coverage at [email or web address], password [email or web address] 08/05/2011, 1:25 PM  LOS: 3 days

## 2011-08-05 NOTE — Progress Notes (Signed)
@   Subjective:  Denies CP or dyspnea; no dizziness   Objective:  Filed Vitals:   08/04/11 2121 08/04/11 2145 08/05/11 0700 08/05/11 0724  BP: 177/89 147/80  171/78  Pulse: 65 80  75  Temp: 98.4 F (36.9 C)   98 F (36.7 C)  TempSrc: Oral   Oral  Resp: 18   16  Height:      Weight:   143 lb 11.8 oz (65.2 kg)   SpO2: 97%   98%    Intake/Output from previous day:  Intake/Output Summary (Last 24 hours) at 08/05/11 0741 Last data filed at 08/04/11 2157  Gross per 24 hour  Intake    463 ml  Output    500 ml  Net    -37 ml    Physical Exam: Physical exam: Well-developed well-nourished in no acute distress.  Skin is warm and dry.  HEENT is normal.  Neck is supple.  Chest is clear to auscultation with normal expansion.  Cardiovascular exam is regular rate and rhythm. 2/6 systolic murmur apex Abdominal exam nontender or distended. No masses palpated. Extremities show no edema. neuro grossly intact    Lab Results: Basic Metabolic Panel:  Basename 08/04/11 0730 08/04/11 0725 08/03/11 0303  NA 141 -- 140  K 3.5 -- 4.1  CL 102 -- 104  CO2 29 -- 28  GLUCOSE 94 -- 107*  BUN 21 -- 28*  CREATININE 0.86 -- 0.87  CALCIUM 9.7 -- 9.6  MG -- 1.7 --  PHOS -- -- --   CBC:  Basename 08/04/11 0730 08/03/11 0303 08/02/11 1630  WBC 10.1 9.8 --  NEUTROABS -- -- 9.3*  HGB 11.5* 10.4* --  HCT 36.1 32.2* --  MCV 87.0 87.7 --  PLT 399 337 --   Cardiac Enzymes:  Basename 08/03/11 1319 08/03/11 0503 08/02/11 2235  CKTOTAL 191* 191* 314*  CKMB 5.3* 8.3* 13.0*  CKMBINDEX -- -- --  TROPONINI <0.30 <0.30 <0.30     Assessment/Plan:  1) syncope - etiology unclear; telemetry reviewed-sinus. Plan as outlined previously; EF 65-70; decrease coreg to 3.125 mg po BID and decrease MS contine; patient can be dced from a cardiac standpoint with outpatient event monitor; fu with Dr Gala Romney 2-4 weeks after DC. 2) Htn - BP mildly elevated; will allow BP to run mildly increased to hopefully  prevent orthostatic component of presenting symptoms. 3) Ecoli UTI - continue antibiotics per primary service. 4) Diastolic CHF-euvolemic; continue present dose of diuretics.  Olga Millers 08/05/2011, 7:41 AM

## 2011-08-06 NOTE — Progress Notes (Signed)
[facility] PROGRESS NOTE  Katie Galvan WUJ:[id number] DOB: 1927/12/14 DOA: 08/02/2011 PCP: Michele Mcalpine, MD    Brief narrative:  78 Y/O Female with hx of HTN, PVD, chronic pain, anemia, hx of CHF ( last echo in 2012 with EF of 45%, this admission stable at 65-70% and mild diastolic dysfn)   Assessment/Plan:  Principal Problem:  Syncope  Likely vasovagal. Patient not a very good historian but informs having abrupt syncopal episodes usually on standing and trying to ambulate and are of a very short duration without any prodromal symptoms, no seizure likely activity or any weakness.  Head CT unremarkable  she was noted to be bradycardic to high 40s as well  Serial cardiac enzymes negative  Beta blocker held on admission  head CT and carotid doppler unremarkable  2D echo shows normal EF  -HR now stable on tele  appreciate cardiology recs. Recommend loop recorder monitoring as outpatient.also recommend to reduce MScontin dose at bedtime to avoid morning or night time orthostasis. -reduced MS contin dose to 15 mg bid and tolerating well. Plan for slow taper as outpatient.  Active Problems:  HYPERTENSION  Resume home meds. started low dose coreg   UTI (lower urinary tract infection)  continune rocephin ( day 3 )  follow urine cx growing ecoli , sensitive to rocephin . Will complete today  Chronic respiratory failure with hypoxia  History of restrictive lung ds  Cont o2 via [place]  Stable  Continue advair and spiriva   Chronic pains syndrome  continue MS contin ( dose reduced to 15 mg bid)    Diastolic CHF, chronic  euvolemic  continue lasix, aldactone and lisinopril. Low dose coereg  EF normal, gr 1 diastolic dysfn   Code Status: full   Disposition Plan:SNF likely on Monday ( [facility] does not take new pt during the weekend)   Consultants:  Dr Patty Sermons     Procedures:  2 d echo, carotid doppler   Antibiotics:  Ceftriaxone ( day  3)   HPI/Subjective:  Denies any dizziness, chest pain or palpitations   Objective: Filed Vitals:   08/05/11 1953 08/05/11 2250 08/06/11 0715 08/06/11 0810  BP:  175/96 133/84   Pulse:  58 64   Temp:  97.4 F (36.3 C) 98.3 F (36.8 C)   TempSrc:  Oral Oral   Resp:  20 20   Height:      Weight:   66.7 kg (147 lb 0.8 oz)   SpO2: 96% 97% 96% 97%    Intake/Output Summary (Last 24 hours) at 08/06/11 1152 Last data filed at 08/06/11 1144  Gross per 24 hour  Intake    480 ml  Output    850 ml  Net   -370 ml    Exam:  General: elderly female in NAD  HEENT: no pallor, moist oral mucosa  Cardiovascular: NS1&S2 , bradycardic, no murmurs  Respiratory: clear b/l, no added sounds  Abdomen: soft, NT, ND, BS+  EXT: warm, no edema  CNS: AAOx3, non focal  Data Reviewed: Basic Metabolic Panel:  Lab 08/04/11 9562 08/04/11 0725 08/03/11 0303 08/02/11 1630 07/30/11 1255  NA 141 -- 140 138 138  K 3.5 -- 4.1 4.2 4.2  CL 102 -- 104 100 95*  CO2 29 -- 28 28 27   GLUCOSE 94 -- 107* 95 97  BUN 21 -- 28* 35* 30*  CREATININE 0.86 -- 0.87 1.03 1.29*  CALCIUM 9.7 -- 9.6 9.8 10.0  MG -- 1.7 -- -- --  PHOS -- -- -- -- --   Liver Function Tests: No results found for this basename: AST:5,ALT:5,ALKPHOS:5,BILITOT:5,PROT:5,ALBUMIN:5 in the last 168 hours No results found for this basename: LIPASE:5,AMYLASE:5 in the last 168 hours No results found for this basename: AMMONIA:5 in the last 168 hours CBC:  Lab 08/04/11 0730 08/03/11 0303 08/02/11 1630 07/30/11 1255  WBC 10.1 9.8 12.1* 13.5*  NEUTROABS -- -- 9.3* --  HGB 11.5* 10.4* 10.8* 11.7*  HCT 36.1 32.2* 33.8* 36.0  MCV 87.0 87.7 87.6 87.4  PLT 399 337 345 289   Cardiac Enzymes:  Lab 08/03/11 1319 08/03/11 0503 08/02/11 2235 08/02/11 1630 07/30/11 1300  CKTOTAL 191* 191* 314* 509* --  CKMB 5.3* 8.3* 13.0* -- --  CKMBINDEX -- -- -- -- --  TROPONINI <0.30 <0.30 <0.30 <0.30 <0.30   BNP (last 3 results)  Basename 08/02/11 1630  05/05/11 1553 12/07/10 1657  PROBNP 1420.0* 368.0* 119.0*   CBG: No results found for this basename: GLUCAP:5 in the last 168 hours  Recent Results (from the past 240 hour(s))  URINE CULTURE     Status: Normal   Collection Time   07/30/11  2:18 PM      Component Value Range Status Comment   Specimen Description URINE, CLEAN CATCH   Final    Special Requests NONE   Final    Culture  Setup Time 07/30/2011 21:20   Final    Colony Count NO GROWTH   Final    Culture NO GROWTH   Final    Report Status 08/01/2011 FINAL   Final   URINE CULTURE     Status: Normal   Collection Time   08/02/11  5:47 PM      Component Value Range Status Comment   Specimen Description URINE, RANDOM   Final    Special Requests NONE   Final    Culture  Setup Time 08/03/2011 05:49   Final    Colony Count >=100,000 COLONIES/ML   Final    Culture ESCHERICHIA COLI   Final    Report Status 08/04/2011 FINAL   Final    Organism ID, Bacteria ESCHERICHIA COLI   Final      Studies: Dg Chest 2 View  08/02/2011  *RADIOLOGY REPORT*  Clinical Data: Fall and increased shortness of breath.  CHEST - 2 VIEW  Comparison: 07/30/2011  Findings: There is stable elevation of the right hemidiaphragm and marked volume loss in the right lung.  Left lung remains clear.  No evidence for a pneumothorax.  Heart and mediastinum are stable.  No evidence for pleural effusions.  IMPRESSION: No acute findings.  Chronic elevation of the right hemidiaphragm.  Original Report Authenticated By: Richarda Overlie, M.D.   Ct Head Wo Contrast  07/30/2011  *RADIOLOGY REPORT*  Clinical Data: Fall  CT HEAD WITHOUT CONTRAST  Technique:  Contiguous axial images were obtained from the base of the skull through the vertex without contrast.  Comparison: CT 03/13/2010  Findings: Generalized atrophy is unchanged.  Chronic microvascular ischemia in the white matter is unchanged.  Negative for acute infarct.  Negative for hemorrhage or mass. Negative for skull fracture.   IMPRESSION: Atrophy and chronic microvascular ischemia.  No acute infarct or hemorrhage.  Original Report Authenticated By: Camelia Phenes, M.D.   Dg Chest Port 1 View  07/30/2011  *RADIOLOGY REPORT*  Clinical Data: Shortness of breath, fever and cough.  PORTABLE CHEST - 1 VIEW  Comparison: 05/05/2011.  Findings: The cardiac silhouette, mediastinal and hilar contours are  stable.  There is tortuosity and calcification of the thoracic aorta.  Stable marked elevation of the right hemidiaphragm and chronic bronchitic type lung changes but no infiltrates, edema or effusions.  The bony thorax is intact.  IMPRESSION: Chronic lung changes but no acute pulmonary findings.  Original Report Authenticated By: P. Loralie Champagne, M.D.    Scheduled Meds:   . aspirin EC  81 mg Oral Daily  . carvedilol  3.125 mg Oral BID WC  . cefTRIAXone (ROCEPHIN)  IV  1 g Intravenous Q24H  . cholecalciferol  2,000 Units Oral Daily  . citalopram  20 mg Oral Daily  . ferrous sulfate  325 mg Oral BID  . Fluticasone-Salmeterol  1 puff Inhalation Q12H  . furosemide  40 mg Oral Daily  . lisinopril  20 mg Oral Daily  . morphine  15 mg Oral Q12H  . multivitamin with minerals  1 tablet Oral Daily  . sodium chloride  3 mL Intravenous Q12H  . sodium chloride  3 mL Intravenous Q12H  . spironolactone  12.5 mg Oral Daily  . tiotropium  18 mcg Inhalation Daily  . vitamin C  500 mg Oral Daily   Continuous Infusions:      Time spent: 30 mins    Katie Galvan  [facility] Pager [phone]. If 8PM-8AM, please contact night-coverage at [email or web address], password [facility] 08/06/2011, 11:52 AM  LOS: 4 days

## 2011-08-07 DIAGNOSIS — I951 Orthostatic hypotension: Secondary | ICD-10-CM | POA: Diagnosis present

## 2011-08-07 MED ORDER — ZOLPIDEM TARTRATE 10 MG PO TABS: 5.0000 mg | ORAL_TABLET | Freq: Every evening | ORAL | Status: DC | PRN

## 2011-08-07 MED ORDER — CARVEDILOL 6.25 MG PO TABS: 3.1250 mg | ORAL_TABLET | Freq: Two times a day (BID) | ORAL | Status: DC

## 2011-08-07 MED ORDER — MORPHINE SULFATE ER 15 MG PO TBCR: 15.0000 mg | EXTENDED_RELEASE_TABLET | Freq: Two times a day (BID) | ORAL | Status: DC

## 2011-08-07 MED ORDER — LISINOPRIL 20 MG PO TABS: 20.0000 mg | ORAL_TABLET | Freq: Every day | ORAL | Status: DC

## 2011-08-07 NOTE — Care Management Note (Signed)
    Page 1 of 1   08/07/2011     2:42:26 PM   CARE MANAGEMENT NOTE 08/07/2011  Patient:  Katie Galvan,Katie Galvan   Account Number:  [id number]  Date Initiated:  08/03/2011  Documentation initiated by:  PEARSON,COOKIE  Subjective/Objective Assessment:   78 yo female with multiple syncopal episodes of unclear etiology  Altered mental status  UTI (lower urinary tract infection)     Action/Plan:   from ALF   Anticipated DC Date:  08/07/2011   Anticipated DC Plan:  SKILLED NURSING FACILITY  In-house referral  Clinical Social Worker         Choice offered to / List presented to:             Status of service:  Completed, signed off Medicare Important Message given?  NA - LOS <3 / Initial given by admissions (If response is "NO", the following Medicare IM given date fields will be blank) Date Medicare IM given:   Date Additional Medicare IM given:    Discharge Disposition:  SKILLED NURSING FACILITY  Per UR Regulation:  Reviewed for med. necessity/level of care/duration of stay  If discussed at Long Length of Stay Meetings, dates discussed:    Comments:  08/07/11 Lanier Clam RN,BSN NCM [phone] D/C  SNF.  08/03/11 MPearson, RN, BSN Chart reviewed.

## 2011-08-07 NOTE — Discharge Summary (Addendum)
Physician Discharge Summary  Katie Galvan WUJ:[id number] DOB: 08/22/1927 DOA: 08/02/2011  PCP: Michele Mcalpine, MD  Admit date: 08/02/2011 Discharge date: 08/07/2011  Recommendations for Outpatient Follow-up:  1. Discharge to SNF. Planned for  outpt loop recorder monitoring by cardiology. 2. Patient still dizzy on attempting to stand up and ambulate. Se is not orthostatic but is high fall risk.  Discharge Diagnoses:  Principal Problem:  *Syncope ( vasovagal vs orthostatic)   Active Problems:  Orthostatic hypotension  HYPERTENSION  UTI (lower urinary tract infection)  Sinus bradycardia  Altered mental status  Chronic respiratory failure with hypoxia  Diastolic CHF, chronic   Discharge Condition: fair. Gets dizzy still on standing . Is high fall risk  Diet recommendation: cardiac  Wt Readings from Last 3 Encounters:  08/07/11 66 kg (145 lb 8.1 oz)  05/23/11 69.763 kg (153 lb 12.8 oz)  05/05/11 67.767 kg (149 lb 6.4 oz)    History of present illness:  78 yo female with h/o chronic chf, oxygen dep 2Lnc cont for diaphragmatic hernia and chronically elevated rt diaphragm, lives independently at assisted living who over the last several months has had 3 episodes of passing out and falling. . Every time it occurs at night when she is alone when she is getting out of bed to do something. She does not have any recollection of any symptoms prior to the syncopal episode but only remembers waking up on the floor. No incontinence and luckily has not broken anything major yet. She was here several days ago with syncope cth was neg but was mildly bradycardic. She is on several cardiac meds but there has been no adjustments in her months for about 6 months.. She was  noted to be again mildly bradycardic and with a uti.   Hospital Course:   Principle problem: Syncope  Likely vasovagal vs orthostatic Patient  informs having abrupt syncopal episodes usually on standing and trying to ambulate and  are of a very short duration without any prodromal symptoms, no seizure likely activity or any weakness.  She is alo on high dose of narcotics ( MS Contin 30 mg am and 15 mg pm) Head CT unremarkable  she was noted to be bradycardic to high 40s as well  Serial cardiac enzymes negative  Beta blocker held on admission  head CT and carotid doppler unremarkable . 2D echo shows normal EF  -HR now stable on tele  appreciate cardiology recs. Recommend loop recorder monitoring as outpatient.also recommend to reduce MScontin dose at bedtime to avoid morning or night time orthostasis. -reduced MS contin dose to 15 mg bid and tolerating well. Recommend slow taper as outpatient.  -also on Ambien 10 mg at bedtime. Will reduce dose to 5 mg only.   Active Problems:  HYPERTENSION  Resumed home meds. increased dose of lisinopril to 20 mg daily. started low dose coreg   UTI (lower urinary tract infection)   urine cx growing ecoli , sensitive to rocephin .completed course  On 8/4  Chronic respiratory failure with hypoxia  History of restrictive lung ds  Cont o2 via [place]   Continue advair and spiriva   Chronic pains syndrome  continue MS contin ( dose reduced to 15 mg bid) . Tolerating current dose well. Needs de escalation fo dose as outpatient.   Diastolic CHF, chronic  euvolemic  continue lasix, aldactone and lisinopril. Low dose coereg  EF normal, gr 1 diastolic dysfn     Patient seen by PT and recommended SNF.  stable  for discharge to SNF Procedures:  2D echo, carotid dopplers  Consultations:  [facility]  Discharge Exam: Filed Vitals:   08/07/11 0715  BP: 137/88  Pulse: 62  Temp: 98.2 F (36.8 C)  Resp: 16   Filed Vitals:   08/06/11 1936 08/06/11 2216 08/07/11 0715 08/07/11 0739  BP:  152/84 137/88   Pulse:  67 62   Temp:  98.1 F (36.7 C) 98.2 F (36.8 C)   TempSrc:  Oral Oral   Resp:  20 16   Height:      Weight:   66 kg (145 lb 8.1 oz)   SpO2: 96% 95% 96% 95%     General: elderly female in NAD  HEENT: no pallor, moist oral mucosa  Cardiovascular: NS1&S2 , bradycardic, no murmurs  Respiratory: clear b/l, no added sounds  Abdomen: soft, NT, ND, BS+  EXT: warm, no edema  CNS: AAOx3, non focal   Discharge Instructions  Discharge Orders    Future Appointments: Provider: Department: Dept Phone: Center:   08/23/2011 4:00 PM Michele Mcalpine, MD [facility] [phone] None   09/05/2011 2:45 PM Cassell Clement, MD [facility] [phone] None     Medication List  As of 08/07/2011 10:36 AM   STOP taking these medications         morphine 30 MG 12 hr tablet         TAKE these medications         aspirin 81 MG EC tablet   Take 81 mg by mouth daily.      CALTRATE 600+D PLUS 600-400 MG-UNIT per tablet   Chew 1 tablet by mouth 2 (two) times daily.      carvedilol 6.25 MG tablet   Commonly known as: COREG   Take 0.5 tablets (3.125 mg total) by mouth 2 (two) times daily with a meal.      cholecalciferol 1000 UNITS tablet   Commonly known as: VITAMIN D   Take 2,000 Units by mouth daily.      citalopram 20 MG tablet   Commonly known as: CELEXA   Take 1 tablet (20 mg total) by mouth daily.      clotrimazole-betamethasone cream   Commonly known as: LOTRISONE   Apply 1 application topically 2 (two) times daily as needed. For rashes on forehead.      ferrous sulfate 325 (65 FE) MG tablet   Take 325 mg by mouth 2 (two) times daily.      Fluticasone-Salmeterol 100-50 MCG/DOSE Aepb   Commonly known as: ADVAIR   Inhale 1 puff into the lungs every 12 (twelve) hours.      furosemide 40 MG tablet   Commonly known as: LASIX   Take 1 tablet (40 mg total) by mouth daily.      lisinopril 20 MG tablet   Commonly known as: PRINIVIL,ZESTRIL   Take 1 tablet (20 mg total) by mouth daily.      loratadine 10 MG tablet   Commonly known as: CLARITIN   Take 10 mg by mouth daily.      morphine 15 MG 12 hr tablet   Commonly known as: MS  CONTIN   Take 1 tablet (15 mg total) by mouth 2 (two) times daily. In am.      multivitamin with minerals Tabs   Take 1 tablet by mouth daily.      spironolactone 25 MG tablet   Commonly known as: ALDACTONE   Take 12.5 mg by mouth daily.  tiotropium 18 MCG inhalation capsule   Commonly known as: SPIRIVA   Place 1 capsule (18 mcg total) into inhaler and inhale daily.      vitamin C 500 MG tablet   Commonly known as: ASCORBIC ACID   Take 500 mg by mouth daily.      zolpidem 10 MG tablet   Commonly known as: AMBIEN   Take 0.5 tablets (5 mg total) by mouth at bedtime as needed for sleep.           Follow-up Information    Follow up with Cassell Clement, MD. (September 3rd at 2:45 pm.)    Contact information:   [address], [address] [place] [address] [phone]       Follow up with [facility] (Event monitor, the office will contact you.)    Contact information:   [address] [place] [address]       Follow up with Michele Mcalpine, MD in 1 week.   Contact information:   [facility] [address] [place] [address] [phone]           The results of significant diagnostics from this hospitalization (including imaging, microbiology, ancillary and laboratory) are listed below for reference.    Significant Diagnostic Studies: Dg Chest 2 View  08/02/2011  *RADIOLOGY REPORT*  Clinical Data: Fall and increased shortness of breath.  CHEST - 2 VIEW  Comparison: 07/30/2011  Findings: There is stable elevation of the right hemidiaphragm and marked volume loss in the right lung.  Left lung remains clear.  No evidence for a pneumothorax.  Heart and mediastinum are stable.  No evidence for pleural effusions.  IMPRESSION: No acute findings.  Chronic elevation of the right hemidiaphragm.  Original Report Authenticated By: Richarda Overlie, M.D.   Ct Head Wo Contrast  07/30/2011   *RADIOLOGY REPORT*  Clinical Data: Fall  CT HEAD WITHOUT CONTRAST  Technique:  Contiguous axial images were obtained from the base of the skull through the vertex without contrast.  Comparison: CT 03/13/2010  Findings: Generalized atrophy is unchanged.  Chronic microvascular ischemia in the white matter is unchanged.  Negative for acute infarct.  Negative for hemorrhage or mass. Negative for skull fracture.  IMPRESSION: Atrophy and chronic microvascular ischemia.  No acute infarct or hemorrhage.  Original Report Authenticated By: Camelia Phenes, M.D.   Dg Chest Port 1 View  07/30/2011  *RADIOLOGY REPORT*  Clinical Data: Shortness of breath, fever and cough.  PORTABLE CHEST - 1 VIEW  Comparison: 05/05/2011.  Findings: The cardiac silhouette, mediastinal and hilar contours are stable.  There is tortuosity and calcification of the thoracic aorta.  Stable marked elevation of the right hemidiaphragm and chronic bronchitic type lung changes but no infiltrates, edema or effusions.  The bony thorax is intact.  IMPRESSION: Chronic lung changes but no acute pulmonary findings.  Original Report Authenticated By: P. Loralie Champagne, M.D.    Microbiology: Recent Results (from the past 240 hour(s))  URINE CULTURE     Status: Normal   Collection Time   07/30/11  2:18 PM      Component Value Range Status Comment   Specimen Description URINE, CLEAN CATCH   Final    Special Requests NONE   Final    Culture  Setup Time 07/30/2011 21:20   Final    Colony Count NO GROWTH   Final    Culture NO GROWTH   Final  Report Status 08/01/2011 FINAL   Final   URINE CULTURE     Status: Normal   Collection Time   08/02/11  5:47 PM      Component Value Range Status Comment   Specimen Description URINE, RANDOM   Final    Special Requests NONE   Final    Culture  Setup Time 08/03/2011 05:49   Final    Colony Count >=100,000 COLONIES/ML   Final    Culture ESCHERICHIA COLI   Final    Report Status 08/04/2011 FINAL   Final     Organism ID, Bacteria ESCHERICHIA COLI   Final      Labs: Basic Metabolic Panel:  Lab 08/04/11 9562 08/04/11 0725 08/03/11 0303 08/02/11 1630  NA 141 -- 140 138  K 3.5 -- 4.1 4.2  CL 102 -- 104 100  CO2 29 -- 28 28  GLUCOSE 94 -- 107* 95  BUN 21 -- 28* 35*  CREATININE 0.86 -- 0.87 1.03  CALCIUM 9.7 -- 9.6 9.8  MG -- 1.7 -- --  PHOS -- -- -- --   Liver Function Tests: No results found for this basename: AST:5,ALT:5,ALKPHOS:5,BILITOT:5,PROT:5,ALBUMIN:5 in the last 168 hours No results found for this basename: LIPASE:5,AMYLASE:5 in the last 168 hours No results found for this basename: AMMONIA:5 in the last 168 hours CBC:  Lab 08/04/11 0730 08/03/11 0303 08/02/11 1630  WBC 10.1 9.8 12.1*  NEUTROABS -- -- 9.3*  HGB 11.5* 10.4* 10.8*  HCT 36.1 32.2* 33.8*  MCV 87.0 87.7 87.6  PLT 399 337 345   Cardiac Enzymes:  Lab 08/03/11 1319 08/03/11 0503 08/02/11 2235 08/02/11 1630  CKTOTAL 191* 191* 314* 509*  CKMB 5.3* 8.3* 13.0* --  CKMBINDEX -- -- -- --  TROPONINI <0.30 <0.30 <0.30 <0.30   BNP: BNP (last 3 results)  Basename 08/02/11 1630 05/05/11 1553 12/07/10 1657  PROBNP 1420.0* 368.0* 119.0*   CBG: No results found for this basename: GLUCAP:5 in the last 168 hours  Time coordinating discharge:45  minutes  Signed:  Josalin Carneiro  [facility] 08/07/2011, 10:36 AM

## 2011-08-07 NOTE — Progress Notes (Addendum)
Patient cleared for discharge. Request for snf auth sent to [employer].  Jacobi Nile C. Michele Kerlin MSW, [place] [phone] Patient accepted to [facility] [employer] received.[employer]ar called for transportation.auth # [id number] Kota Ciancio C. Huda Petrey MSW, LCSW [phone]

## 2011-08-08 ENCOUNTER — Telehealth: Payer: Self-pay

## 2011-08-10 ENCOUNTER — Encounter (HOSPITAL_COMMUNITY): Payer: Self-pay | Admitting: *Deleted

## 2011-08-14 ENCOUNTER — Other Ambulatory Visit: Payer: Self-pay | Admitting: *Deleted

## 2011-08-14 ENCOUNTER — Other Ambulatory Visit (HOSPITAL_COMMUNITY): Payer: Self-pay | Admitting: Physician Assistant

## 2011-08-16 ENCOUNTER — Other Ambulatory Visit: Payer: Self-pay | Admitting: Pulmonary Disease

## 2011-08-16 MED ORDER — FUROSEMIDE 40 MG PO TABS: 40.0000 mg | ORAL_TABLET | Freq: Every day | ORAL | Status: DC

## 2011-08-23 ENCOUNTER — Ambulatory Visit: Payer: Medicare Other | Admitting: Pulmonary Disease

## 2011-08-23 NOTE — Telephone Encounter (Signed)
Patient is in the Hospital.

## 2011-08-25 ENCOUNTER — Encounter (HOSPITAL_COMMUNITY): Payer: Medicare Other

## 2011-09-05 ENCOUNTER — Encounter: Payer: Medicare Other | Admitting: Cardiology

## 2011-09-06 ENCOUNTER — Encounter: Payer: Self-pay | Admitting: Pulmonary Disease

## 2011-09-06 ENCOUNTER — Ambulatory Visit (INDEPENDENT_AMBULATORY_CARE_PROVIDER_SITE_OTHER): Payer: Medicare Other | Admitting: Pulmonary Disease

## 2011-09-06 VITALS — BP 110/64 | HR 63 | Temp 97.0°F | Ht 64.0 in | Wt 158.0 lb

## 2011-09-06 DIAGNOSIS — M545 Low back pain, unspecified: Secondary | ICD-10-CM

## 2011-09-06 DIAGNOSIS — I5032 Chronic diastolic (congestive) heart failure: Secondary | ICD-10-CM

## 2011-09-06 DIAGNOSIS — R1319 Other dysphagia: Secondary | ICD-10-CM

## 2011-09-06 DIAGNOSIS — M1990 Unspecified osteoarthritis, unspecified site: Secondary | ICD-10-CM

## 2011-09-06 DIAGNOSIS — F329 Major depressive disorder, single episode, unspecified: Secondary | ICD-10-CM

## 2011-09-06 DIAGNOSIS — M949 Disorder of cartilage, unspecified: Secondary | ICD-10-CM

## 2011-09-06 DIAGNOSIS — G894 Chronic pain syndrome: Secondary | ICD-10-CM

## 2011-09-06 DIAGNOSIS — M899 Disorder of bone, unspecified: Secondary | ICD-10-CM

## 2011-09-06 DIAGNOSIS — E7800 Pure hypercholesterolemia, unspecified: Secondary | ICD-10-CM

## 2011-09-06 DIAGNOSIS — J986 Disorders of diaphragm: Secondary | ICD-10-CM

## 2011-09-06 DIAGNOSIS — R0902 Hypoxemia: Secondary | ICD-10-CM

## 2011-09-06 DIAGNOSIS — F3289 Other specified depressive episodes: Secondary | ICD-10-CM

## 2011-09-06 DIAGNOSIS — I38 Endocarditis, valve unspecified: Secondary | ICD-10-CM

## 2011-09-06 DIAGNOSIS — I509 Heart failure, unspecified: Secondary | ICD-10-CM

## 2011-09-06 DIAGNOSIS — J9610 Chronic respiratory failure, unspecified whether with hypoxia or hypercapnia: Secondary | ICD-10-CM

## 2011-09-06 DIAGNOSIS — D649 Anemia, unspecified: Secondary | ICD-10-CM

## 2011-09-06 DIAGNOSIS — I10 Essential (primary) hypertension: Secondary | ICD-10-CM

## 2011-09-06 DIAGNOSIS — I872 Venous insufficiency (chronic) (peripheral): Secondary | ICD-10-CM

## 2011-09-06 DIAGNOSIS — K5730 Diverticulosis of large intestine without perforation or abscess without bleeding: Secondary | ICD-10-CM

## 2011-09-06 DIAGNOSIS — J9611 Chronic respiratory failure with hypoxia: Secondary | ICD-10-CM

## 2011-09-06 MED ORDER — MORPHINE SULFATE ER 15 MG PO TBCR: 15.0000 mg | EXTENDED_RELEASE_TABLET | Freq: Two times a day (BID) | ORAL | Status: DC

## 2011-09-06 NOTE — Patient Instructions (Addendum)
Today we updated your med list in our EPIC system...    Continue your current medications the same...  We refilled your MSContin 15mg  tabs today...  Let's plan a follow up recheck in about 1 month w/ repeat blood work at that time.Marland KitchenMarland Kitchen

## 2011-09-06 NOTE — Progress Notes (Signed)
Subjective:     Patient ID: Katie Galvan, female   DOB: [birth year], 78 y.o.   MRN: [id number]  HPI  Review of Systems    Physical Exam        Subjective:    Patient ID: Katie Galvan, female    DOB: [birth year], 78 y.o.   MRN: [id number]  HPI: 62 y/o WF, mother of Katie Galvan, who moved here from [place] in 2011...  she has multiple medical problems including chronically elev right hemidiaph;  HBP;  Diastolic dysfunction & mild PulmHTN on 2DEcho;  VI & Edema;  Hx Dyspagia & gastritis;  Divertics & ?colon polyp;  Hx UTI & renal failure from dehydration 3/12;  Severe DJD- s/p R.THR/ ?CPPD/ LBP/ Chr Pain Syndrome on MS Contin;  Osteopenia;  Anemia...  ~  March 24, 2010:  She was hosp 3/11 - 03/17/10 w/ UTI, dehydration & renal insuffic> cults were neg & PCT=0.22, but active sediment & given IV Rocephin in hosp;  BUN 118 =>15 & Creat 2.7 =>0.72 w/ hydration but BNP incr to 636;  Hg 10.0 =>9.1 & Fe=11 (7%sat), B12=478;  EGD by DrDBrodie showed HH & gastritis w/ neg HPylori, Rx w/ Prilosec & Carafate transiently;  Currently she feels better & looks great> right hip surg postponed & anxious to resched due to her severe pain (on MS 30+15 Bid & still in pain, wants to add back Mobic-OK);  We wanted to check labs today but they couldn't get blood ==> reviewed all her meds, add FeSO4/ VitC, add Mobic Prn, OK for surg (check labs in hosp preop)...    HBP>  On ASA & Diltiazem 240mg  bid;  BP= 122/66 today & she denies CP, palpit, ch in SOB;  She does have incr edema since hydrated in hosp & BNP was up to 636 by disch (we discussed no salt, contin Lasix40mg /d);      Cardiac>  EKG essent WNL & 2DEcho showed mild LVH, norm sys funct w/ EF=55-65%, gr1 DD, mild MR w/ calcif annulus, Ao sclerosis w/ triv AI, mod PulmHTN w/ PAsys=46.  ~  June 30, 2010:  62mo ROV & post hosp visit>  She was hosp x2 since I saw her last >>    Hosp 4/6 - 04/18/10 by DrBlackman for right THR & required a revision  of the acetabular component 4d after the initial surg...     Hosp 6/2 - 06/13/10 w/ hypoxemic acute resp failure precipitated by fluid overload & diastolic CHF (BNP=2000) in assoc w/ her chr elev right hemidiaph, RLL atx/ scarring, & scoliosis w/ multilevel degen disc dis(w/ resultant restrictive lung disease); she was also Anemic (Hg= 9-10) w/ low Fe (=10, 6%sat); and also remains on large dose narcotic analgesics for her chr pain syndrome>  Disch back to [place] [facility] on meds as reconciled below...     She is currently getting PT & walking w/ a walker, going about 200 ft w/ her nasal O2 at 2L/min w/ some DOE but she has been very sedentary for a long time;  She denies CP, palpit, syncope, but has some mild edema on Lasix 40mg /d; We rechecked her O2 sat= 83% RA at rest & she will need to stay on the O2 for now;  Her narcotic pain med hasn't been weaned any further since her hip surg in April> on MSContin 30+15 Bid + Percocet 5mg  prn...    LABS today>  CXR stable (elev right hemidiaph, atx, NAD);  Chems normal (Creat 0.8, BNP 95);  CBC w/ anemia (Hg 9.1, MCV 77, Fe 15), she has B pos blood> NOTE she had EGD 3/12 showing HH, gastritis, neg HPylori...    We decided to keep Lasix 40mg /d, restrict sodium etc;  Increase PPI to Bid, incr Fe w/ VitC Bid, check stool for occult blood, consider IV Fe if not improving orally & consider further GI eval...  ~  July 21, 2010:  3wk ROV & last visit we decided to keep LASIX 40mg /d; try to cut the MSContin from 45mg Bid to 45mg AM & 30mg PM, incr the Fe to Bid (w/ VitC), and incr the Prilosec 20mg Bid...  She reports stable & is now back home w/ family (out of skilled care & not yet ready to be on her own in [place])> they have weaned the MSContin down to 30mg AM & 30mg PM & doing satis (no Oxycodone needed);  Labs shows improved Hg= 10.0 now;  O2 sats improved> 94% RA at rest & decr to 93% on RA walking in the room;  Her edema is gone & BUN=25, Creat=0.8;  Getting home PT 3d per week;   Also BS improved to the 120-130 range...    We decided to continue current meds but decr the Diltiazem to 240mg  once daily (due to BP sl low at 102/54); and they will continue to slowly wean the MSContin...  ~  November 02, 2010:  Post Hosp visit> she was hosp 10/7 - 10/19/10 w/ abd pain/ N/ V/ D & found to have ?colitis ?presumed infectious & resolved w/ Cipro/ Flagyl; also had CP & marked incr enzymes w/ +MB & Troponin=21; EKG reported to show diffuse STseg elev (no tracings in EChart to review); no rub on exam, felt to have myopericarditis; 2DEcho showed LV cavity dilated w/ septal apical anterior HK, & incr wall thickness c/w modLVH & mild conc hypertrophy; mildMR, normal RV;  She also developed fluid overload (off her diuretic & receiving IVF w/ the colitis) w/ BNP up to 10,000 & then diuresed w/ improvement; her prev meds were changed> Diltiazem & Vasotec stopped, given Lisinopril 2.5mg , & Lasix decr to 20mg /d...    Since disch she has retained fluid w/ 3+edema in LEs & gained>5lbs on the Lasix20 in the NH;  BP= 116/68;  We rechecked studies today:  CXR shows marked elev right hemidiaph & appears similar to her baseline film 6/12;  BNP=483;  Chems= normal;  Hg=11.0, WBC=8.2, TSH=5.77 but was norm prev (?euthyroid "sick" syndrome);  We discussed incr LASIX to 40mg /d, elevate legs, ?try support hose, & f/u in 4-6 weeks... She has appt w/ Cards tomorrow.  ~  December 07, 2010:  6wk ROV & she has seen DrBensimhon at the CHF clinic in the interim (3 weekly visits w/ the PAs)> he endorsed her Lasix at 40mg /d and added Spironolactone 12.5mg /d as well (stopping the KCl); she is improved & wt is down 14# from last visit to 139# today;  He increased her Coreg to 6.25Bid & plans short term follow up w/ repeat 2DEcho...  She tells me she wants to stop the Oxygen & use it only at night> we ambulated her in the halls today on RA- started out 90% RA at rest (HR70), desat to 86% after 2 laps (HR96 in bigeminy); she is  asked to continue the O2 w/ exercise & Qhs, ok to take it off at rest when not active...  She is still on the MSContin at 30mg Bid & not requiring any of the OxyIR> we discussed pushing her down to 30-15,  the 15Bid if poss over the next interval (meds regulated by her son [name])...  Labs today look great w/ K=3.6, TCO2=33, BUN=21, Creat=0.9, BNP=119 (her best yet).  ~  February 21, 2011:  8mo ROV & Zakyrah is stable> she saw DrBensimhon 12/12 & doing well on Lasix40, Aldactone12.5, & Coreg 6.25Bid; he repeated her 2DEcho & her LVF had recovered to 55-60% (LV cavity mildly dil, mild LVH, mild MR, PAsys=48mmHg); he felt her myopericarditis had resolved & he released her from CHF clinic follow up...    Hx Resp Fail> chr elev right hemidiaph, on O2 w/ exerc & Qhs, Advair100 Bid & Spiriva daily; stable & rec to continue same plus incr exerc as able...    HBP> controlled on the Coreg, diuretics, & Lisinopril 2.5mg /d (off prev CCB); BP= 148/88 & she is reminded to elim salt, etc; wt is up 3# to 142# & we will monitor...    Chol> with the mult hospitalizations & rehab stays w/ numerous med reconciliations done- she has stopped her prev Mevacor20; we discussed checking f/u FLP off the statin in the future...    ORTHO> she has persistent discomfort in her hip, right shoulder, back & the chr pain syndrome currently on MSContin 30mg AM & 15mg PM; we discussed trial of decr to 15mg Bid betw now 7 f/u in 3 months...    GYN> she mentioned some vag spotting & apparently has a pessary in place since her last check in [place]; we will refer to GYN for eval...  ~  May 04, 2011:  67mo ROV & add-on for recent incr SOB; she reports fall recently w/ feet swelling now & noted incr SOB; she is on her Oxygen at 1-2L/min, and had f/u Cards 3/13 for her sys & diast heart failure but they kept her diuretics the same> on Lasix 40mg /d but she is instructed to incr to 2/d if she has extra fluid/ edema/ etc...    She also had GYN appt DrFernandez  2/13 for post menopausal bleeding, pessary for uterine prolapse x yrs (it was changed regularly in [place] but not in >23yr); DrF said her vag walls & Cx were irritated & friable, he removed pessary & Rx w/ estrace cream, she was to ret in 10d but didn't go (pessary is still out & she has not experienced any descensus so far); she will sched GYN follow up at her convenience... CXR 5/13 showed marked eventration of right hemidiaph (no change), mild cardiomeg, atherosclerotic calcif of tortuous Ao, sl congestion & Atx, etc... LABS 5/13:  Chems- ok w/ BS=125 BUN=14 Creat=0.8;  CBC- ok w/ Hg=12.3;  TSH=1.71;  BNP= 368...  ~  May 23, 2011:  3wk ROV & Katie Galvan is improved- breathing better, less SOB, no chest discomfort, and ?swelling down; she notes better appetite & wt is actually up 4#; we discussed continuing the Lasix 40mg  each AM but she may incr to 80mg  on any day that swelling doesn't go down overnight; rec regular exercise etc & she is getting PT at [facility]...  ~  September 06, 2011:  67mo ROV & post hosp check>     She states it all started w/ "falls"- went to ER 07/30/11 after having been found on the floor at [facility], didn't know what happened, no apparent injury etc, ?some speech prob, no focal neuro deficits;  Labs- Hg=11.7, WBC=13.5, BUN/Cr= 30/1.3;  Urine C&S= neg;  CT Head> atrophy & sm vessel dis, no acute infarct or hem;  CXR> chr changes & elev right hemidiaph,  NAD;  EKG> SBrady, rate58, NAD;  She was ret to the NH improved...    She was Adm to [facility] 7/31 - 08/07/11 after another "fall" & by report they were occuring at night when trying to get OOB- syncopal & awakes on the floor, no recollection of the fall etc; no major trauma, no incont, not post ictal, she was mildly bradycardic on Coreg6.25Bid, no signif arrhythmia noted, felt to be prob orthostatic vs vagal;  Labs- Hg=10-11.5, WBC=10-12K, BUN/Cr= 35-21/1.03-0.86, BNP=1420, Urine+Ecoli sens Cipro;  CDopplers> tortuous vessels, no signif  stenosis, vertebrals patent w/ antegrade flow;  EKG> SBrady, rate53, NSSTTWA;  2DEcho> mild LVH, norm LVF w/ EF=65-70%, Gr1DD, calcif mitral annulus, mildly thick leaflets, mild MR, mild LA dil, mild calcif AoV leaftlets, no AS/AI;  She was disch to SNF at [facility] & meds adjusted==>They reduced Coreg3.125Bid, reduced MSContin15Bid, reduced Ambien5prn...    In the NH they f/u labs and found BUN=55, Creat=1.13 and Lasix, Aldactone, Lisinopril HELD; subseq Labs improved & restarted Aldactone25-1/2tab & Lisinopril5mg /d; final Labs 08/31/11 showed BUN=27, Creat=0.94...    Since ret to her apt she is improved- not dizzy, not postural, no recurrent falls or syncpe; Pulm & Cardiac ROS is neg & she is ambulating w/ walker, still has hip pain & c/o new right shoulder pain w/ decr ROM (may have hit it during one of her prev falls); [name] will call DrBlackman for Ortho eval;  They also mentioned decr hearing (she says OK) & I encouraged them to get Audiology eval at ENT office DrWolicki...    HOSPITALIZATIONS: ~  [facility] 11/3 - 11/08/09 after fall at home w/ comminuted left wrist fx & had surg by DrKuzma ~  [facility] 3/11 - 03/17/10 w/ UTI, dehydration & renal insuffic> back to norm w/ hydration; also anemic, Fe defic & GI eval DrDBrodie w/ HH/ gastritis ~  [facility] - 04/18/10 by DrBlackman for right THR & required a revision of the acetabular component 4d after the initial surg... ~  [facility] 6/2 - 06/13/10 w/ hypoxemic acute resp failure precipitated by fluid overload & diastolic CHF (BNP=2000) in assoc w/ her chr elev right hemidiaph, RLL atx/ scarring, & scoliosis w/ multilevel degen disc dis(w/ resultant restrictive lung disease)... ~  [facility] 10/7 - 10/19/10 w/ abd pain/ N/ V/ D & found to have ?colitis ?presumed infectious & resolved w/ Cipro/ Flagyl; also had CP & abn EKG/ Enz felt to be a myopericarditis... ~  [facility] 7/31 - 08/07/11 w/ fall/syncope ?etiology- felt to be poss orthostatic vs vagal; Neuro & CV evals were  unrevealing, meds adjusted & improved...          Problem List:    ACUTE ON CHRONIC RESP INSUFFICIENCY>  Precipitated 6/12 by diastolic CHF superimposed on her chronic resp problems etc... Now on ADVAIR100 Bid, SPIRIVA daily, O2 2L/ min... DIAPHRAGMATIC DISORDER (ICD-519.4) - she has a chronically elev right hemidiaph, apparently idiopathic; and she is mostly asymptomatic w/o cough, phlegm, hemoptysis, ch in SOB, etc... ~  11/11:  CXR showed calcif Ao, elev right hemidiaph, mild scarring at bases, NAD... ~  6/12:  Presented to ER w/ hypoxemic resp failure due to vol overload assoc w/ her restrictive lung dis & chr pain syndrome requiring narcotic analgesics... ~  7/12:  O2 sats improved & OK to cut back the Oxygen to prn... ~  10/12:  Hosp w/ ?colitis and ?myopericarditis> developed fluid retention, CHF, abn EKG/ Enz & 2DEcho> seen by [facility] ==> now followed in the CHF clinic/  DrBensimhon & back on Oxygen regularly ==> weaned to exerc & Qhs. ~  5/13:  She is feeling better & wonders if she needs the O2; offered to check ambulatory O2 here & poss ONO at [facility] but she wants to wait... ~  7/13:  CXR showed chr changes & elev right hemidiaph, NAD...  Hx HYPERTENSION (ICD-401.9) VALVULAR HEART DISEASE (ICD-424.90) > prev trivial AI, mild MR... Hx of CARDIAC ARRHYTHMIA (ICD-427.9)  EPISODE of MYOPERICARDITIS w/ decr LVF> see 10/12 Hosp... ~  she was on ASA81, Diltiazem240, Vasotec20, & LASIX 40mg /d; but meds changed during the 10/12 Hosp to ASA 81mg /d, LISINOPRIL 2.5mg /d, LASIX 20mg /d ==> freq med adjustments/ titrations from DrBensimhon CHF clinic... ~  11/11 & 6/12:  EKG showed NSR, NSSTTWA, NAD... ~  2DEcho 6/12 showed mild LVH, norm LVF w/ EF=55-60%, Gr 1 DD, trivial AI, heavily calcif mitral annulus & mild MR, PAsys est . ~  10/12:  SEE ABOVE + EChart records... Hosp w/ ?myopericarditis, elev enz, abn 2DEcho & meds adjusted> now followed freq via the CHF clinic/ DrBensimhon w/ freq  med adjustments... ~  12/12:  Now much improved on COREG 6.25Bid, LISINOPRIL 2.5mg /d, LASIX 40mg /d, SPIRONOLACTONE 12.5mg /d; wt down to 139# & BNP= 119... ~  Repeat 2DEcho 12/12 showed LVF recovered to 55-60% (LV cavity mildly dil, mild LVH, mild MR, PAsys=54mmHg). ~  5/13: presents w/ incr SOB & eval shows wt=149# & BNP= 368 despite Lasix40 & Aldactone12.5; BP is also elev at 178/88; rec to increase Lasix to 80 for a few days ==> she reports improved and back to baseline...  ~  8/13: she was [facility] w/ fall/syncope ?etiology> 2DEcho showed mild LVH, norm LVF w/ EF=65-70%, Gr1DD, calcif mitral annulus, mildly thick leaflets, mild MR, mild LA dil, mild calcif AoV leaftlets, no AS/AI; now on ASA81, COREG6.25-1/2Bid, LISINOPRIL5, ALDACTONE25-1/2daily...  VENOUS INSUFFICIENCY (ICD-459.81) LEG EDEMA, CHRONIC (ICD-782.3) - she has severe venous stasis changes and chronic edema, thickened skin over LE's etc... she knows to be careful w/ hygiene, no salt, elevation, support hose, etc ~  8/12:  Stable on Lasix40, 1+chr edema, wt=137# ~  10/12:  Post hosp check on Lasix20, 3+edema, BNP=483, wt=152#; rec to incr Lasix to 40mg /d... ~  12/12:  meds adjusted as above via CHF clinic & now K=3.6, TCO2=33, BUN=21, Creat=0.9, BNP=119 (her best yet)... ~  5/13:  We discussed adjusting Lasix to 40mg  daily & 80mg  prn for swelling that doesn't go down overnight... ~  8/13:  Post hosp meds adjusted & off Lasix...  HYPERCHOLESTEROLEMIA (ICD-272.0) - she has been on Mevacor 20mg /d from her [place] physician... ~  Perhaps we can try to get an FLP at [facility] since we cannot get her in for Fasting labs. ~  FLP during the 10/12 hosp showed TChol 146, TG 98, HDL 53, LDL 73 ~  Due to numerous [place], Rehab stays in [place], & mult med reconciliations- her Mevacor was dropped along the way; wants to leave it off & check FLP on diet alone.  OTHER DYSPHAGIA (ICD-787.29) - she was on PRILOSEC 20mg /d & reports occas dysphagia but denies  choking etc... she apparently has never had an Endoscopy or GI eval for this problem, "I have to be careful when I eat"... ~  6/12:  NOTE she had EGD 3/12 showing HH, gastritis, neg HPylori> rec increase PPI to Bid. ~  10/12:  She was discharge off her PPI therapy ==> she denies reflux symptoms. ~  8/13:  She states stable w/o abd pain, n/v,  dysphagia, etc...  DIVERTICULOSIS OF COLON (ICD-562.10) Hx of COLONIC POLYPS (ICD-211.3) - she tells me that prev colonoscopy in [place] showed divertics & ?polyp but she does not recall any details...  she denies much constipation despite the narcotic analgesics- uses prunes as needed. ~  [facility] 10/12 w/ ?infectious colitis> resolved after Cipro/ Flagyl Rx ==> uses Miralax prn due to narcotics.  DEGENERATIVE JOINT DISEASE (ICD-715.90) - she has severe osteoarthritis w/ XRays 11/11 showing severe osteopenia, severe right hip degeneration & relative sparing of the left hip joint;  DJD knees w/ ?CPPD, abn patella;  left wrist fx- radial  head & ulnar styloid... she had surg left wrist by DrKKuzma... ~  4/12:  DrBlackman did right THR but required revision of acetabular component 4d later> went to [facility] for rehab... ~  DrBlackman continues to follow w/ shots as needed for bursitis she says... ~  10/12:  She is c/o right shoulder pain & decr ROM, she wonders if this might be the cause of her chest discomfort; she will f/u w/ Ortho for XRays & shot... ~  8/13:  She is again c/o right shoulder pain & wonders if she injured it during one of her falls; she will f/u w/ DrBlackman for eval...  GYN >>  ~  2/13: pt mentioned some spotting & apparently has a pessary in place since her days in [place]; she is in need of GYN eval & check up & we will refer... ~  2/13: she saw DrFernandez for GYN> said her vag walls & Cx were irritated & friable, he removed pessary & Rx w/ estrace cream, she was to ret in 10d but didn't go (pessary is still out & she has not experienced  any descensus so far); she will sched GYN follow up at her convenience...  Hx of BACK PAIN, LUMBAR (ICD-724.2) CHRONIC PAIN SYNDROME (ICD-338.4) - she has chronic pain- mostly from her arthritic condition- and was started on MSContin by her LMD in [place] in the summer of 2011> dose slowly increased & she was on 90mg  Bid (taking a 60mg  tab + 30mg  tab Bid)... she does not believe that this med has anything to do w/ her fall & wrist fx, or her complaints of decr memory etc... we discussed the need to wean off this medication & try to deal w/ her arthritis pain thru an Orthopedist or poss a Pain Clinic... ~  11/11:  she notes pain worse during the day while up & about> try to decr MSContin to 90mg AM & 60mg PM... ~  1/12 & 4/12:  she is down to 60mg Bid & we discussed weaning further ==> she was down to 45mg  (30+15) Bid by the time of her right THR... ~  6/12:  Recommend that she try to wean further> try 45am & 30pm... ~  7/12:  She's down to 30mg  Bid & will wean further as able... ~  10/12:  She remains on the MSContin at 30mg Bid... ~  12/12:  We discussed further slow wean down to 30-15 first... ~  2/13:  We discussed trying to wean down to 15mg  Bid over the next few months, but she was unable to do so; states that DrBlackman feels it's due to her back... ~  8/13:  She is down to MSContin 15mg Bid since her recent hosp...  OSTEOPENIA (ICD-733.90) - XRays here showed severe osteopenia... she notes that she used to be on Fosamax but ?how long? & when stopped?... she states that she was told her prev  BMD was "good"... she has not been taking calcium supplements, but was prev on some OTC Vit D supplements... asked to resume Calcium, Women's MVI, Vit D 1000u daily...  Hx of DEPRESSION (ICD-311) - on CELEXA 20mg /d and she wants to continue this med... INSOMNIA >> on AMBIEN 10mg - 1/2 tab as needed...  ANEMIA, MILD (ICD-285.9) - Hg post op 11/11 wrist fx surg = 11.2 ~  6/12:  Labs showed Hg= 9.1, MCV= 77, Fe=  15 (7%sat), & she has B pos blood> we decided to incr Fe to Bid (may need IV Fe if not responding). ~  7/12:  Labs showed Hg= 10.0, MCV= 76, she reports stool checks at [facility] were NEG... ~  8/12: she reports that she had f/u Heme eval DrMohammed & "he released me" on Fe + VitC daily... ~  10/12:  Labs post hosp showed Hg= 11.0, MCV= 87 ~  Labs 5/13 showed Hg= 12.3   Past Surgical History  Procedure Date  . Appendectomy   . Breast surgery   . Right wrist surgery   . Bilateral cataracts   . Hip surgery   . Tonsillectomy   . Adenoidectomy     Outpatient Encounter Prescriptions as of 09/06/2011  Medication Sig Dispense Refill  . aspirin 81 MG EC tablet Take 81 mg by mouth daily.        . Calcium Carbonate-Vit D-Min (CALTRATE 600+D PLUS) 600-400 MG-UNIT per tablet Chew 1 tablet by mouth 2 (two) times daily.       . carvedilol (COREG) 6.25 MG tablet Take 0.5 tablets (3.125 mg total) by mouth 2 (two) times daily with a meal.  60 tablet  0  . cholecalciferol (VITAMIN D) 1000 UNITS tablet Take 2,000 Units by mouth daily.      . citalopram (CELEXA) 20 MG tablet Take 1 tablet (20 mg total) by mouth daily.  30 tablet  6  . clotrimazole-betamethasone (LOTRISONE) cream Apply 1 application topically 2 (two) times daily as needed. For rashes on forehead.      . ferrous sulfate 325 (65 FE) MG tablet Take 325 mg by mouth 2 (two) times daily.        . Fluticasone-Salmeterol (ADVAIR DISKUS) 100-50 MCG/DOSE AEPB Inhale 1 puff into the lungs every 12 (twelve) hours.  60 each  11  . furosemide (LASIX) 40 MG tablet Take 1 tablet (40 mg total) by mouth daily.  30 tablet  6  . lisinopril (PRINIVIL,ZESTRIL) 5 MG tablet Take 5 mg by mouth daily.      Marland Kitchen loratadine (CLARITIN) 10 MG tablet Take 10 mg by mouth daily.        Marland Kitchen morphine (MS CONTIN) 15 MG 12 hr tablet Take 1 tablet (15 mg total) by mouth 2 (two) times daily. In am.  30 tablet  0  . Multiple Vitamin (MULTIVITAMIN WITH MINERALS) TABS Take 1 tablet by  mouth daily.      . Naproxen Sodium (ALEVE) 220 MG CAPS Take 1 capsule by mouth 2 (two) times daily.      Marland Kitchen spironolactone (ALDACTONE) 25 MG tablet Take 12.5 mg by mouth daily.       Marland Kitchen tiotropium (SPIRIVA) 18 MCG inhalation capsule Place 1 capsule (18 mcg total) into inhaler and inhale daily.  30 capsule  11  . vitamin C (ASCORBIC ACID) 500 MG tablet Take 500 mg by mouth daily.        Marland Kitchen zolpidem (AMBIEN) 10 MG tablet Take 0.5 tablets (5 mg total) by mouth  at bedtime as needed for sleep.  30 tablet  5  . DISCONTD: carvedilol (COREG) 6.25 MG tablet TAKE ONE TABLET BY MOUTH TWICE DAILY WITH A MEAL  60 tablet  6  . DISCONTD: lisinopril (PRINIVIL,ZESTRIL) 20 MG tablet Take 1 tablet (20 mg total) by mouth daily.  30 tablet  0    Allergies  Allergen Reactions  . Sulfonamide Derivatives Swelling    Current Medications, Allergies, Past Medical History, Past Surgical History, Family History, and Social History were reviewed in [employer] record.    Review of Systems: Constitutional:  Denies F/C/S, anorexia, unexpected weight change. HEENT:  No HA, visual changes, earache, nasal symptoms, sore throat, hoarseness. Resp:  No cough, sputum, hemoptysis; mild SOB/ DOE noted... Cardio:  No CP, palpit, orthopnea;  +edema & DOE but limited mobility. GI:  Denies N/V/D, tends toward constip due to narcotics; swallowing OK & denies reflux or abd pain.  GU:  No dysuria, freq, urgency, hematuria, or flank pain. MS:  Severe DJD esp right hip w/ pain & decr ROM ==> s/p right THR now... Neuro:  No tremors, seizures, dizziness, syncope; +weakness & multifactorial gait abn. Skin:  No suspicious lesions or skin rash. Heme:  No adenopathy, bruising, bleeding. Psyche: Denies confusion, sleep disturbance, hallucinations; +anxiety & situational depression.    Objective:  Physical Exam:  WD, WN, 78 y/o WF chr ill appearing but in NAD... Vital Signs:  Reviewed... General:  Alert & oriented;  pleasant & cooperative... HEENT:  [place]/AT, EOM-wnl, PERRLA, EACs-clear, TMs-wnl, NOSE-clear, THROAT-clear & wnl. Neck:  Supple w/ decr ROM; no JVD; normal carotid impulses w/o bruits; no thyromegaly or nodules palpated; no lymphadenopathy. Chest:  Clear to P & A; without wheezes/ rales/ or rhonchi heard... Heart:  Regular Rhythm; norm S1 & S2 without murmurs/ rubs/ or gallops detected... Abdomen:  Soft & nontender; normal bowel sounds; no organomegaly or masses palpated... Ext:  decrROM; +deformities & mod arthritic changes; no varicose veins, +venous insuffic & 1+edema;  Pulses intact w/o bruits... s/p right THR w/ revision; ambulates w/ walker... Neuro:  CNs intact;  No focal neuro deficits, in wheelchair & painful standing & walking... Derm:  No lesions noted; no rash etc... Lymph:  No cervical, supraclavicular, axillary, or inguinal adenopathy palpated...   RADIOLOGY DATA:  Reviewed in the EPIC EMR & discussed w/ the patient...  LABORATORY DATA:  Reviewed in the EPIC EMR & discussed w/ the patient...   Assessment & Plan:    Acute on Chr Resp Failure>  Multifactorial w/ diastolic CHF, elev right hemidiaph, atx, scoliosis, restrictive lung dis, narcotic pain meds- all playing a role... O2 sats are now improved & she can decr the oxygen to as needed at rest & 2L/min w/ exercise...  Elev right hemidiaph>  This is chronic & assoc w/ some mild basilar atx; encouraged to get deep breaths & expand well...  HBP, etc>  She was felt to have orthostatic syncope & meds adjusted down- now off Lasix, taking Aldactone 12.5mg /d + Coreg 3.125Bid & Lisinopril 5mg /d...  Hx of Myopericarditis w/ abn EKG/ Enz & subseq vol overload w/ abn 2DEcho>  She improved back on her diuretic & meds were adjusted in the CHF clinic; repeat 2DEcho 12/12 w/ recovery of LVF & back to her baseline... F/u 2DEcho 8/13= improved w/ EF=65-70%, Gr1DD...  Diastolic CHF>  She is now OFF the Lasix due to Adm w/ syncope...  VI,  Edema>  REC- no salt, elevate legs, TED hose, etc...  CHOL>  ?  Mevacor on her list, but not currently taking, we will try to sort this out & f/u FLP later...  GERD, Dysphagia>  She had EGD during prev hosp w/ HH, gastritis & neg HPylori;  Prev on PPI Rx but off now & she feels she is OK, we will follow.  Hx of Colitis- presumed infectious>  Resolved w/ Cipro/ Flagyl; off meds and back to baseline GI status she feels...  Divertics, Constip>  Related to her narcotic analgesics; improved w/ laxatives OTC...  DJD, s/p right THR, Chr Pain Syndrome> Trying to wean MSContin further which should be poss based on the fact that the painful right hip has been replaced...  Other medical problems as noted...    GYN> pt c/o spotting & has pessary in place since her last check in [place]; we will help her get a gyn appt ASAP for eval & check up...    Derm> she has a seborrheic dermatitis rash & we will Rx w/ Lotrisone cream..   Patient's Medications  New Prescriptions   No medications on file  Previous Medications   ASPIRIN 81 MG EC TABLET    Take 81 mg by mouth daily.     CALCIUM CARBONATE-VIT D-MIN (CALTRATE 600+D PLUS) 600-400 MG-UNIT PER TABLET    Chew 1 tablet by mouth 2 (two) times daily.    CARVEDILOL (COREG) 6.25 MG TABLET    Take 0.5 tablets (3.125 mg total) by mouth 2 (two) times daily with a meal.   CHOLECALCIFEROL (VITAMIN D) 1000 UNITS TABLET    Take 2,000 Units by mouth daily.   CITALOPRAM (CELEXA) 20 MG TABLET    Take 1 tablet (20 mg total) by mouth daily.   CLOTRIMAZOLE-BETAMETHASONE (LOTRISONE) CREAM    Apply 1 application topically 2 (two) times daily as needed. For rashes on forehead.   FERROUS SULFATE 325 (65 FE) MG TABLET    Take 325 mg by mouth 2 (two) times daily.     FLUTICASONE-SALMETEROL (ADVAIR DISKUS) 100-50 MCG/DOSE AEPB    Inhale 1 puff into the lungs every 12 (twelve) hours.   FUROSEMIDE (LASIX) 40 MG TABLET    Take 1 tablet (40 mg total) by mouth daily.   LISINOPRIL  (PRINIVIL,ZESTRIL) 5 MG TABLET    Take 5 mg by mouth daily.   LORATADINE (CLARITIN) 10 MG TABLET    Take 10 mg by mouth daily.     MULTIPLE VITAMIN (MULTIVITAMIN WITH MINERALS) TABS    Take 1 tablet by mouth daily.   NAPROXEN SODIUM (ALEVE) 220 MG CAPS    Take 1 capsule by mouth 2 (two) times daily.   SPIRONOLACTONE (ALDACTONE) 25 MG TABLET    Take 12.5 mg by mouth daily.    TIOTROPIUM (SPIRIVA) 18 MCG INHALATION CAPSULE    Place 1 capsule (18 mcg total) into inhaler and inhale daily.   VITAMIN C (ASCORBIC ACID) 500 MG TABLET    Take 500 mg by mouth daily.     ZOLPIDEM (AMBIEN) 10 MG TABLET    Take 0.5 tablets (5 mg total) by mouth at bedtime as needed for sleep.  Modified Medications   Modified Medication Previous Medication   MORPHINE (MS CONTIN) 15 MG 12 HR TABLET morphine (MS CONTIN) 15 MG 12 hr tablet      Take 1 tablet (15 mg total) by mouth 2 (two) times daily.    Take 1 tablet (15 mg total) by mouth 2 (two) times daily. In am.  Discontinued Medications   CARVEDILOL (COREG) 6.25 MG  TABLET    TAKE ONE TABLET BY MOUTH TWICE DAILY WITH A MEAL   LISINOPRIL (PRINIVIL,ZESTRIL) 20 MG TABLET    Take 1 tablet (20 mg total) by mouth daily.

## 2011-09-14 ENCOUNTER — Ambulatory Visit (HOSPITAL_COMMUNITY)
Admission: RE | Admit: 2011-09-14 | Discharge: 2011-09-14 | Disposition: A | Discharge status: Home / Self Care | Payer: Medicare Other | Source: Ambulatory Visit | Attending: Internal Medicine | Admitting: Internal Medicine

## 2011-09-14 ENCOUNTER — Encounter (HOSPITAL_COMMUNITY): Payer: Self-pay

## 2011-09-14 VITALS — BP 116/78 | HR 91 | Wt 156.4 lb

## 2011-09-14 DIAGNOSIS — I5032 Chronic diastolic (congestive) heart failure: Secondary | ICD-10-CM

## 2011-09-14 DIAGNOSIS — R55 Syncope and collapse: Secondary | ICD-10-CM

## 2011-09-14 DIAGNOSIS — I509 Heart failure, unspecified: Secondary | ICD-10-CM

## 2011-09-14 NOTE — Progress Notes (Signed)
PCP/Pulmonologist: Dr [name]  HPI:  Katie Galvan is an 78 yo with history of mixed diastolic and systolic HF, LVEF 30%, restrictive lung disease, right hemidiaphragm elevation, COPD, GERD, history of endocarditis recently admitted for colitis requiring cipro/flagyl.  Presumed myocarditis treated with colchicine.  Echo 10/10/10: Hyperdynamic basal function Septal apical anterior hypokinesis The cavity size was severely dilated.  Wall thickness was increased in a pattern of moderate LVH.  There was mild concentric hypertrophy. The estimated ejection fraction was 30%.     12/12 ECHO EF recovered  55-60%   She returns for follow up.  Discharged from hospital 1 month ago for 3 syncopal episodes.  She has had no further syncopal episodes.  SOB without oxygen, continues to wear O2. Dyspnea on exertion. Denies Orthopnea/PND.  No dizziness with standing.  Chronic lower extremity. Chronic O2 2 liters [place].  Compliant with medications.   ROS: All other systems normal except as mentioned in HPI, past medical history and problem list.    Past Medical History  Diagnosis Date  . Disorders of diaphragm   . Unspecified essential hypertension   . Endocarditis, valve unspecified, unspecified cause   . Cardiac dysrhythmia, unspecified   . Unspecified venous (peripheral) insufficiency   . Edema   . Pure hypercholesterolemia   . Other dysphagia   . Diverticulosis of colon (without mention of hemorrhage)   . Benign neoplasm of colon   . Osteoarthrosis, unspecified whether generalized or localized, unspecified site   . Lumbago   . Chronic pain syndrome   . Disorder of bone and cartilage, unspecified   . Depressive disorder, not elsewhere classified   . Anemia, unspecified   . Skin cancer   . Systolic heart failure     echo 10/10/10 EF 30%    Current Outpatient Prescriptions  Medication Sig Dispense Refill  . aspirin 81 MG EC tablet Take 81 mg by mouth daily.        . Calcium Carbonate-Vit D-Min (CALTRATE  600+D PLUS) 600-400 MG-UNIT per tablet Chew 1 tablet by mouth 2 (two) times daily.       . carvedilol (COREG) 6.25 MG tablet Take 0.5 tablets (3.125 mg total) by mouth 2 (two) times daily with a meal.  60 tablet  0  . cholecalciferol (VITAMIN D) 1000 UNITS tablet Take 2,000 Units by mouth daily.      . citalopram (CELEXA) 20 MG tablet Take 1 tablet (20 mg total) by mouth daily.  30 tablet  6  . clotrimazole-betamethasone (LOTRISONE) cream Apply 1 application topically 2 (two) times daily as needed. For rashes on forehead.      . ferrous sulfate 325 (65 FE) MG tablet Take 325 mg by mouth 2 (two) times daily.        . Fluticasone-Salmeterol (ADVAIR DISKUS) 100-50 MCG/DOSE AEPB Inhale 1 puff into the lungs every 12 (twelve) hours.  60 each  11  . lisinopril (PRINIVIL,ZESTRIL) 5 MG tablet Take 5 mg by mouth daily.      Marland Kitchen loratadine (CLARITIN) 10 MG tablet Take 10 mg by mouth daily.        Marland Kitchen morphine (MS CONTIN) 15 MG 12 hr tablet Take 1 tablet (15 mg total) by mouth 2 (two) times daily.  60 tablet  0  . Multiple Vitamin (MULTIVITAMIN WITH MINERALS) TABS Take 1 tablet by mouth daily.      . Naproxen Sodium (ALEVE) 220 MG CAPS Take 1 capsule by mouth 2 (two) times daily.      Marland Kitchen  spironolactone (ALDACTONE) 25 MG tablet Take 12.5 mg by mouth daily.       Marland Kitchen tiotropium (SPIRIVA) 18 MCG inhalation capsule Place 1 capsule (18 mcg total) into inhaler and inhale daily.  30 capsule  11  . vitamin C (ASCORBIC ACID) 500 MG tablet Take 500 mg by mouth daily.        Marland Kitchen zolpidem (AMBIEN) 10 MG tablet Take 0.5 tablets (5 mg total) by mouth at bedtime as needed for sleep.  30 tablet  5  . furosemide (LASIX) 40 MG tablet Take 1 tablet (40 mg total) by mouth daily.  30 tablet  6  Lasix only as needed.   Allergies  Allergen Reactions  . Sulfonamide Derivatives Swelling    PHYSICAL EXAM: Filed Vitals:   09/14/11 1503  BP: 116/78  Pulse: 91  Weight: 156 lb 6.4 oz (70.943 kg)  SpO2: 94%     General:  Well  appearing. No respiratory difficulty, on O2. HEENT: normal Neck: supple. JVP 6- 7. Carotids 2+ bilat; no bruits. No lymphadenopathy or thryomegaly appreciated. Cor: PMI nondisplaced. Regular rate & rhythm. No rubs, gallops or murmurs. Lungs: CTA Abdomen: soft, nontender, nondistended. No hepatosplenomegaly. No bruits or masses. Good bowel sounds. Extremities: no cyanosis, clubbing, rash, edema,  Neuro: alert & oriented x 3, cranial nerves grossly intact. moves all 4 extremities w/o difficulty. Affect pleasant.   ASSESSMENT & PLAN:

## 2011-09-14 NOTE — Patient Instructions (Addendum)
Your physician has recommended that you wear an event monitor. Event monitors are medical devices that record the heart's electrical activity. Doctors most often us these monitors to diagnose arrhythmias. Arrhythmias are problems with the speed or rhythm of the heartbeat. The monitor is a small, portable device. You can wear one while you do your normal daily activities. This is usually used to diagnose what is causing palpitations/syncope (passing out).  Your physician recommends that you schedule a follow-up appointment in: 1 month  

## 2011-09-19 NOTE — Assessment & Plan Note (Signed)
Volume status stable.  Lasix cut back to once daily in hospital, will continue this dose.  Patient will hold lasix if weight falls, she voices understanding.

## 2011-09-19 NOTE — Assessment & Plan Note (Addendum)
Patient recently discharged for hospital for 2 syncopal episodes.  No orthostatic hypotension or arrhythmias noted in hospital.  Will place 2 week monitor for completeness sake.  Will continue current medications.  Will follow up in 1 month.

## 2011-09-22 ENCOUNTER — Telehealth: Payer: Self-pay | Admitting: *Deleted

## 2011-09-22 NOTE — Telephone Encounter (Signed)
Patient called and canceled monitor appointment for 09/14/11 and 09/20/11. Due to transportation issues.

## 2011-10-06 ENCOUNTER — Encounter: Payer: Self-pay | Admitting: *Deleted

## 2011-10-09 ENCOUNTER — Encounter: Payer: Self-pay | Admitting: Pulmonary Disease

## 2011-10-09 ENCOUNTER — Ambulatory Visit (INDEPENDENT_AMBULATORY_CARE_PROVIDER_SITE_OTHER): Payer: Medicare Other | Admitting: Pulmonary Disease

## 2011-10-09 VITALS — BP 108/78 | HR 108 | Temp 97.2°F | Ht 64.0 in | Wt 148.8 lb

## 2011-10-09 DIAGNOSIS — M949 Disorder of cartilage, unspecified: Secondary | ICD-10-CM

## 2011-10-09 DIAGNOSIS — F329 Major depressive disorder, single episode, unspecified: Secondary | ICD-10-CM

## 2011-10-09 DIAGNOSIS — K5730 Diverticulosis of large intestine without perforation or abscess without bleeding: Secondary | ICD-10-CM

## 2011-10-09 DIAGNOSIS — E7800 Pure hypercholesterolemia, unspecified: Secondary | ICD-10-CM

## 2011-10-09 DIAGNOSIS — M1990 Unspecified osteoarthritis, unspecified site: Secondary | ICD-10-CM

## 2011-10-09 DIAGNOSIS — J986 Disorders of diaphragm: Secondary | ICD-10-CM

## 2011-10-09 DIAGNOSIS — F3289 Other specified depressive episodes: Secondary | ICD-10-CM

## 2011-10-09 DIAGNOSIS — I872 Venous insufficiency (chronic) (peripheral): Secondary | ICD-10-CM

## 2011-10-09 DIAGNOSIS — J9611 Chronic respiratory failure with hypoxia: Secondary | ICD-10-CM

## 2011-10-09 DIAGNOSIS — J9610 Chronic respiratory failure, unspecified whether with hypoxia or hypercapnia: Secondary | ICD-10-CM

## 2011-10-09 DIAGNOSIS — R0902 Hypoxemia: Secondary | ICD-10-CM

## 2011-10-09 DIAGNOSIS — D649 Anemia, unspecified: Secondary | ICD-10-CM

## 2011-10-09 DIAGNOSIS — G894 Chronic pain syndrome: Secondary | ICD-10-CM

## 2011-10-09 DIAGNOSIS — I509 Heart failure, unspecified: Secondary | ICD-10-CM

## 2011-10-09 DIAGNOSIS — M899 Disorder of bone, unspecified: Secondary | ICD-10-CM

## 2011-10-09 DIAGNOSIS — I10 Essential (primary) hypertension: Secondary | ICD-10-CM

## 2011-10-09 DIAGNOSIS — I5032 Chronic diastolic (congestive) heart failure: Secondary | ICD-10-CM

## 2011-10-09 DIAGNOSIS — M545 Low back pain, unspecified: Secondary | ICD-10-CM

## 2011-10-09 MED ORDER — TIOTROPIUM BROMIDE MONOHYDRATE 18 MCG IN CAPS: 18.0000 ug | ORAL_CAPSULE | Freq: Every day | RESPIRATORY_TRACT | Status: DC

## 2011-10-09 MED ORDER — FUROSEMIDE 40 MG PO TABS: 40.0000 mg | ORAL_TABLET | Freq: Every day | ORAL | Status: DC

## 2011-10-09 MED ORDER — CITALOPRAM HYDROBROMIDE 20 MG PO TABS: 20.0000 mg | ORAL_TABLET | Freq: Every day | ORAL | Status: DC

## 2011-10-09 MED ORDER — CARVEDILOL 6.25 MG PO TABS: 3.1250 mg | ORAL_TABLET | Freq: Two times a day (BID) | ORAL | Status: DC

## 2011-10-09 MED ORDER — ZOLPIDEM TARTRATE 10 MG PO TABS: 5.0000 mg | ORAL_TABLET | Freq: Every evening | ORAL | Status: DC | PRN

## 2011-10-09 MED ORDER — FLUTICASONE-SALMETEROL 100-50 MCG/DOSE IN AEPB: 1.0000 | INHALATION_SPRAY | Freq: Two times a day (BID) | RESPIRATORY_TRACT | Status: DC

## 2011-10-09 MED ORDER — MORPHINE SULFATE ER 15 MG PO TBCR: 15.0000 mg | EXTENDED_RELEASE_TABLET | Freq: Two times a day (BID) | ORAL | Status: DC

## 2011-10-09 NOTE — Progress Notes (Signed)
Subjective:     Patient ID: Katie Galvan, female   DOB: [birth year], 78 y.o.   MRN: [id number]  HPI  Review of Systems    Physical Exam        Subjective:    Patient ID: Katie Galvan, female    DOB: [birth year], 78 y.o.   MRN: [id number]  HPI: 78 y/o WF, mother of Katie Galvan, who moved here from [place] in 2011...  she has multiple medical problems including chronically elev right hemidiaph;  HBP;  Diastolic dysfunction & mild PulmHTN on 2DEcho;  VI & Edema;  Hx Dyspagia & gastritis;  Divertics & ?colon polyp;  Hx UTI & renal failure from dehydration 3/12;  Severe DJD- s/p R.THR/ ?CPPD/ LBP/ Chr Pain Syndrome on MS Contin;  Osteopenia;  Anemia...  ~  March 24, 2010:  She was hosp 3/11 - 03/17/10 w/ UTI, dehydration & renal insuffic> cults were neg & PCT=0.22, but active sediment & given IV Rocephin in hosp;  BUN 118 =>15 & Creat 2.7 =>0.72 w/ hydration but BNP incr to 636;  Hg 10.0 =>9.1 & Fe=11 (7%sat), B12=478;  EGD by DrDBrodie showed HH & gastritis w/ neg HPylori, Rx w/ Prilosec & Carafate transiently;  Currently she feels better & looks great> right hip surg postponed & anxious to resched due to her severe pain (on MS 30+15 Bid & still in pain, wants to add back Mobic-OK);  We wanted to check labs today but they couldn't get blood ==> reviewed all her meds, add FeSO4/ VitC, add Mobic Prn, OK for surg (check labs in hosp preop)...    HBP>  On ASA & Diltiazem 240mg  bid;  BP= 122/66 today & she denies CP, palpit, ch in SOB;  She does have incr edema since hydrated in hosp & BNP was up to 636 by disch (we discussed no salt, contin Lasix40mg /d);      Cardiac>  EKG essent WNL & 2DEcho showed mild LVH, norm sys funct w/ EF=55-65%, gr1 DD, mild MR w/ calcif annulus, Ao sclerosis w/ triv AI, mod PulmHTN w/ PAsys=46.  ~  June 30, 2010:  62mo ROV & post hosp visit>  She was hosp x2 since I saw her last >>    Hosp 4/6 - 04/18/10 by DrBlackman for right THR & required a revision  of the acetabular component 4d after the initial surg...     Hosp 6/2 - 06/13/10 w/ hypoxemic acute resp failure precipitated by fluid overload & diastolic CHF (BNP=2000) in assoc w/ her chr elev right hemidiaph, RLL atx/ scarring, & scoliosis w/ multilevel degen disc dis(w/ resultant restrictive lung disease); she was also Anemic (Hg= 9-10) w/ low Fe (=10, 6%sat); and also remains on large dose narcotic analgesics for her chr pain syndrome>  Disch back to [place] [facility] on meds as reconciled below...     She is currently getting PT & walking w/ a walker, going about 200 ft w/ her nasal O2 at 2L/min w/ some DOE but she has been very sedentary for a long time;  She denies CP, palpit, syncope, but has some mild edema on Lasix 40mg /d; We rechecked her O2 sat= 83% RA at rest & she will need to stay on the O2 for now;  Her narcotic pain med hasn't been weaned any further since her hip surg in April> on MSContin 30+15 Bid + Percocet 5mg  prn...    LABS today>  CXR stable (elev right hemidiaph, atx, NAD);  Chems normal (Creat 0.8, BNP 95);  CBC w/ anemia (Hg 9.1, MCV 77, Fe 15), she has B pos blood> NOTE she had EGD 3/12 showing HH, gastritis, neg HPylori...    We decided to keep Lasix 40mg /d, restrict sodium etc;  Increase PPI to Bid, incr Fe w/ VitC Bid, check stool for occult blood, consider IV Fe if not improving orally & consider further GI eval...  ~  July 21, 2010:  3wk ROV & last visit we decided to keep LASIX 40mg /d; try to cut the MSContin from 45mg Bid to 45mg AM & 30mg PM, incr the Fe to Bid (w/ VitC), and incr the Prilosec 20mg Bid...  She reports stable & is now back home w/ family (out of skilled care & not yet ready to be on her own in [place])> they have weaned the MSContin down to 30mg AM & 30mg PM & doing satis (no Oxycodone needed);  Labs shows improved Hg= 10.0 now;  O2 sats improved> 94% RA at rest & decr to 93% on RA walking in the room;  Her edema is gone & BUN=25, Creat=0.8;  Getting home PT 3d per week;   Also BS improved to the 120-130 range...    We decided to continue current meds but decr the Diltiazem to 240mg  once daily (due to BP sl low at 102/54); and they will continue to slowly wean the MSContin...  ~  November 02, 2010:  Post Hosp visit> she was hosp 10/7 - 10/19/10 w/ abd pain/ N/ V/ D & found to have ?colitis ?presumed infectious & resolved w/ Cipro/ Flagyl; also had CP & marked incr enzymes w/ +MB & Troponin=21; EKG reported to show diffuse STseg elev (no tracings in EChart to review); no rub on exam, felt to have myopericarditis; 2DEcho showed LV cavity dilated w/ septal apical anterior HK, & incr wall thickness c/w modLVH & mild conc hypertrophy; mildMR, normal RV;  She also developed fluid overload (off her diuretic & receiving IVF w/ the colitis) w/ BNP up to 10,000 & then diuresed w/ improvement; her prev meds were changed> Diltiazem & Vasotec stopped, given Lisinopril 2.5mg , & Lasix decr to 20mg /d...    Since disch she has retained fluid w/ 3+edema in LEs & gained>5lbs on the Lasix20 in the NH;  BP= 116/68;  We rechecked studies today:  CXR shows marked elev right hemidiaph & appears similar to her baseline film 6/12;  BNP=483;  Chems= normal;  Hg=11.0, WBC=8.2, TSH=5.77 but was norm prev (?euthyroid "sick" syndrome);  We discussed incr LASIX to 40mg /d, elevate legs, ?try support hose, & f/u in 4-6 weeks... She has appt w/ Cards tomorrow.  ~  December 07, 2010:  6wk ROV & she has seen DrBensimhon at the CHF clinic in the interim (3 weekly visits w/ the PAs)> he endorsed her Lasix at 40mg /d and added Spironolactone 12.5mg /d as well (stopping the KCl); she is improved & wt is down 14# from last visit to 139# today;  He increased her Coreg to 6.25Bid & plans short term follow up w/ repeat 2DEcho...  She tells me she wants to stop the Oxygen & use it only at night> we ambulated her in the halls today on RA- started out 90% RA at rest (HR70), desat to 86% after 2 laps (HR96 in bigeminy); she is  asked to continue the O2 w/ exercise & Qhs, ok to take it off at rest when not active...  She is still on the MSContin at 30mg Bid & not requiring any of the OxyIR> we discussed pushing her down to 30-15,  the 15Bid if poss over the next interval (meds regulated by her son Katie Galvan)...  Labs today look great w/ K=3.6, TCO2=33, BUN=21, Creat=0.9, BNP=119 (her best yet).  ~  February 21, 2011:  8mo ROV & Zakyrah is stable> she saw DrBensimhon 12/12 & doing well on Lasix40, Aldactone12.5, & Coreg 6.25Bid; he repeated her 2DEcho & her LVF had recovered to 55-60% (LV cavity mildly dil, mild LVH, mild MR, PAsys=48mmHg); he felt her myopericarditis had resolved & he released her from CHF clinic follow up...    Hx Resp Fail> chr elev right hemidiaph, on O2 w/ exerc & Qhs, Advair100 Bid & Spiriva daily; stable & rec to continue same plus incr exerc as able...    HBP> controlled on the Coreg, diuretics, & Lisinopril 2.5mg /d (off prev CCB); BP= 148/88 & she is reminded to elim salt, etc; wt is up 3# to 142# & we will monitor...    Chol> with the mult hospitalizations & rehab stays w/ numerous med reconciliations done- she has stopped her prev Mevacor20; we discussed checking f/u FLP off the statin in the future...    ORTHO> she has persistent discomfort in her hip, right shoulder, back & the chr pain syndrome currently on MSContin 30mg AM & 15mg PM; we discussed trial of decr to 15mg Bid betw now 7 f/u in 3 months...    GYN> she mentioned some vag spotting & apparently has a pessary in place since her last check in [place]; we will refer to GYN for eval...  ~  May 04, 2011:  67mo ROV & add-on for recent incr SOB; she reports fall recently w/ feet swelling now & noted incr SOB; she is on her Oxygen at 1-2L/min, and had f/u Cards 3/13 for her sys & diast heart failure but they kept her diuretics the same> on Lasix 40mg /d but she is instructed to incr to 2/d if she has extra fluid/ edema/ etc...    She also had GYN appt DrFernandez  2/13 for post menopausal bleeding, pessary for uterine prolapse x yrs (it was changed regularly in [place] but not in >23yr); DrF said her vag walls & Cx were irritated & friable, he removed pessary & Rx w/ estrace cream, she was to ret in 10d but didn't go (pessary is still out & she has not experienced any descensus so far); she will sched GYN follow up at her convenience... CXR 5/13 showed marked eventration of right hemidiaph (no change), mild cardiomeg, atherosclerotic calcif of tortuous Ao, sl congestion & Atx, etc... LABS 5/13:  Chems- ok w/ BS=125 BUN=14 Creat=0.8;  CBC- ok w/ Hg=12.3;  TSH=1.71;  BNP= 368...  ~  May 23, 2011:  3wk ROV & Katie Galvan is improved- breathing better, less SOB, no chest discomfort, and ?swelling down; she notes better appetite & wt is actually up 4#; we discussed continuing the Lasix 40mg  each AM but she may incr to 80mg  on any day that swelling doesn't go down overnight; rec regular exercise etc & she is getting PT at [facility]...  ~  September 06, 2011:  67mo ROV & post hosp check>     She states it all started w/ "falls"- went to ER 07/30/11 after having been found on the floor at [facility], didn't know what happened, no apparent injury etc, ?some speech prob, no focal neuro deficits;  Labs- Hg=11.7, WBC=13.5, BUN/Cr= 30/1.3;  Urine C&S= neg;  CT Head> atrophy & sm vessel dis, no acute infarct or hem;  CXR> chr changes & elev right hemidiaph,  NAD;  EKG> SBrady, rate58, NAD;  She was ret to the NH improved...    She was Adm to [facility] 7/31 - 08/07/11 after another "fall" & by report they were occuring at night when trying to get OOB- syncopal & awakes on the floor, no recollection of the fall etc; no major trauma, no incont, not post ictal, she was mildly bradycardic on Coreg6.25Bid, no signif arrhythmia noted, felt to be prob orthostatic vs vagal;  Labs- Hg=10-11.5, WBC=10-12K, BUN/Cr= 35-21/1.03-0.86, BNP=1420, Urine+Ecoli sens Cipro;  CDopplers> tortuous vessels, no signif  stenosis, vertebrals patent w/ antegrade flow;  EKG> SBrady, rate53, NSSTTWA;  2DEcho> mild LVH, norm LVF w/ EF=65-70%, Gr1DD, calcif mitral annulus, mildly thick leaflets, mild MR, mild LA dil, mild calcif AoV leaftlets, no AS/AI;  She was disch to SNF at [facility] & meds adjusted==>They reduced Coreg3.125Bid, reduced MSContin15Bid, reduced Ambien5prn...    In the NH they f/u labs and found BUN=55, Creat=1.13 and Lasix, Aldactone, Lisinopril HELD; subseq Labs improved & restarted Aldactone25-1/2tab & Lisinopril5mg /d; final Labs 08/31/11 showed BUN=27, Creat=0.94...    Since ret to her apt she is improved- not dizzy, not postural, no recurrent falls or syncpe; Pulm & Cardiac ROS is neg & she is ambulating w/ walker, still has hip pain & c/o new right shoulder pain w/ decr ROM (may have hit it during one of her prev falls); Katie Galvan will call DrBlackman for Ortho eval;  They also mentioned decr hearing (she says OK) & I encouraged them to get Audiology eval at ENT office DrWolicki...  ~  October 09, 2011:  91mo ROV & son reports that she's continued to be weak & has lost 9# to 149# today; they feel sl better since she has cut Aldactone25 in half & stopped the Lasix40- now just using it 'prn" & none recently; we discussed need for incr exercise, daily physical activity, & she has f/u w/ her orthopedist soon...    We reviewed prob list, meds, xrays and labs> see below for updates >> she has already had the 2013 Flu vaccine...   HOSPITALIZATIONS: ~  [facility] 11/3 - 11/08/09 after fall at home w/ comminuted left wrist fx & had surg by DrKuzma ~  [facility] 3/11 - 03/17/10 w/ UTI, dehydration & renal insuffic> back to norm w/ hydration; also anemic, Fe defic & GI eval DrDBrodie w/ HH/ gastritis ~  [facility] - 04/18/10 by DrBlackman for right THR & required a revision of the acetabular component 4d after the initial surg... ~  [facility] 6/2 - 06/13/10 w/ hypoxemic acute resp failure precipitated by fluid overload & diastolic CHF  (BNP=2000) in assoc w/ her chr elev right hemidiaph, RLL atx/ scarring, & scoliosis w/ multilevel degen disc dis(w/ resultant restrictive lung disease)... ~  [facility] 10/7 - 10/19/10 w/ abd pain/ N/ V/ D & found to have ?colitis ?presumed infectious & resolved w/ Cipro/ Flagyl; also had CP & abn EKG/ Enz felt to be a myopericarditis... ~  [facility] 7/31 - 08/07/11 w/ fall/syncope ?etiology- felt to be poss orthostatic vs vagal; Neuro & CV evals were unrevealing, meds adjusted & improved...          Problem List:    ACUTE ON CHRONIC RESP INSUFFICIENCY>  Precipitated 6/12 by diastolic CHF superimposed on her chronic resp problems etc... Now on ADVAIR100 Bid, SPIRIVA daily, O2 2L/ min... DIAPHRAGMATIC DISORDER (ICD-519.4) - she has a chronically elev right hemidiaph, apparently idiopathic; and she is mostly asymptomatic w/o cough, phlegm, hemoptysis, ch in SOB, etc... ~  11/11:  CXR showed calcif Ao, elev right hemidiaph, mild scarring at bases, NAD... ~  6/12:  Presented to ER w/ hypoxemic resp failure due to vol overload assoc w/ her restrictive lung dis & chr pain syndrome requiring narcotic analgesics... ~  7/12:  O2 sats improved & OK to cut back the Oxygen to prn... ~  10/12:  Hosp w/ ?colitis and ?myopericarditis> developed fluid retention, CHF, abn EKG/ Enz & 2DEcho> seen by [facility] ==> now followed in the CHF clinic/ DrBensimhon & back on Oxygen regularly ==> weaned to exerc & Qhs. ~  5/13:  She is feeling better & wonders if she needs the O2; offered to check ambulatory O2 here & poss ONO at [facility] but she wants to wait... ~  7/13:  CXR showed chr changes & elev right hemidiaph, NAD...  Hx HYPERTENSION (ICD-401.9) VALVULAR HEART DISEASE (ICD-424.90) > prev trivial AI, mild MR... Hx of CARDIAC ARRHYTHMIA (ICD-427.9)  EPISODE of MYOPERICARDITIS w/ decr LVF> see 10/12 Hosp... ~  she was on ASA81, Diltiazem240, Vasotec20, & LASIX 40mg /d; but meds changed during the 10/12 Hosp to ASA 81mg /d,  LISINOPRIL 2.5mg /d, LASIX 20mg /d ==> freq med adjustments/ titrations from DrBensimhon CHF clinic... ~  11/11 & 6/12:  EKG showed NSR, NSSTTWA, NAD... ~  2DEcho 6/12 showed mild LVH, norm LVF w/ EF=55-60%, Gr 1 DD, trivial AI, heavily calcif mitral annulus & mild MR, PAsys est . ~  10/12:  SEE ABOVE + EChart records... Hosp w/ ?myopericarditis, elev enz, abn 2DEcho & meds adjusted> now followed freq via the CHF clinic/ DrBensimhon w/ freq med adjustments... ~  12/12:  Now much improved on COREG 6.25Bid, LISINOPRIL 2.5mg /d, LASIX 40mg /d, SPIRONOLACTONE 12.5mg /d; wt down to 139# & BNP= 119... ~  Repeat 2DEcho 12/12 showed LVF recovered to 55-60% (LV cavity mildly dil, mild LVH, mild MR, PAsys=32mmHg). ~  5/13: presents w/ incr SOB & eval shows wt=149# & BNP= 368 despite Lasix40 & Aldactone12.5; BP is also elev at 178/88; rec to increase Lasix to 80 for a few days ==> she reports improved and back to baseline...  ~  EKG 7/13 showed SBrady, rate53, minor NSSTTWA, otherw ok... ~  8/13: she was [facility] w/ fall/syncope ?etiology> 2DEcho showed mild LVH, norm LVF w/ EF=65-70%, Gr1DD, calcif mitral annulus, mildly thick leaflets, mild MR, mild LA dil, mild calcif AoV leaftlets, no AS/AI; now on ASA81, COREG6.25-1/2Bid, LISINOPRIL5, ALDACTONE25-1/2daily... ~  CDopplers 8/13 showed TDS due to anatomy & tortuosity- no signif extracranial carotid dis, vertebrals are patent & antegrade... ~  10/13:  BP= 110/80 & they are reminded to bring up-to-date list of all meds to every visit...  VENOUS INSUFFICIENCY (ICD-459.81) LEG EDEMA, CHRONIC (ICD-782.3) - she has severe venous stasis changes and chronic edema, thickened skin over LE's etc... she knows to be careful w/ hygiene, no salt, elevation, support hose, etc ~  8/12:  Stable on Lasix40, 1+chr edema, wt=137# ~  10/12:  Post hosp check on Lasix20, 3+edema, BNP=483, wt=152#; rec to incr Lasix to 40mg /d... ~  12/12:  meds adjusted as above via CHF clinic & now  K=3.6, TCO2=33, BUN=21, Creat=0.9, BNP=119 (her best yet)... ~  5/13:  We discussed adjusting Lasix to 40mg  daily & 80mg  prn for swelling that doesn't go down overnight... ~  8/13:  Post hosp meds adjusted & off Lasix...  HYPERCHOLESTEROLEMIA (ICD-272.0) - she has been on Mevacor 20mg /d from her [place] physician... ~  Perhaps we can try to get an FLP at [facility] since we cannot  get her in for Fasting labs. ~  FLP during the 10/12 hosp showed TChol 146, TG 98, HDL 53, LDL 73 ~  Due to numerous [place], Rehab stays in [place], & mult med reconciliations- her Mevacor was dropped along the way; wants to leave it off & check FLP on diet alone.  OTHER DYSPHAGIA (ICD-787.29) - she was on PRILOSEC 20mg /d & reports occas dysphagia but denies choking etc... she apparently has never had an Endoscopy or GI eval for this problem, "I have to be careful when I eat"... ~  6/12:  NOTE she had EGD 3/12 showing HH, gastritis, neg HPylori> rec increase PPI to Bid. ~  10/12:  She was discharge off her PPI therapy ==> she denies reflux symptoms. ~  8/13:  She states stable w/o abd pain, n/v, dysphagia, etc...  DIVERTICULOSIS OF COLON (ICD-562.10) Hx of COLONIC POLYPS (ICD-211.3) - she tells me that prev colonoscopy in [place] showed divertics & ?polyp but she does not recall any details...  she denies much constipation despite the narcotic analgesics- uses prunes as needed. ~  [facility] 10/12 w/ ?infectious colitis> resolved after Cipro/ Flagyl Rx ==> uses Miralax prn due to narcotics.  DEGENERATIVE JOINT DISEASE (ICD-715.90) - she has severe osteoarthritis w/ XRays 11/11 showing severe osteopenia, severe right hip degeneration & relative sparing of the left hip joint;  DJD knees w/ ?CPPD, abn patella;  left wrist fx- radial  head & ulnar styloid... she had surg left wrist by DrKKuzma... ~  4/12:  DrBlackman did right THR but required revision of acetabular component 4d later> went to [facility] for rehab... ~  DrBlackman  continues to follow w/ shots as needed for bursitis she says... ~  10/12:  She is c/o right shoulder pain & decr ROM, she wonders if this might be the cause of her chest discomfort; she will f/u w/ Ortho for XRays & shot... ~  8/13:  She is again c/o right shoulder pain & wonders if she injured it during one of her falls; she will f/u w/ DrBlackman for eval...  GYN >>  ~  2/13: pt mentioned some spotting & apparently has a pessary in place since her days in [place]; she is in need of GYN eval & check up & we will refer... ~  2/13: she saw DrFernandez for GYN> said her vag walls & Cx were irritated & friable, he removed pessary & Rx w/ estrace cream, she was to ret in 10d but didn't go (pessary is still out & she has not experienced any descensus so far); she will sched GYN follow up at her convenience...  Hx of BACK PAIN, LUMBAR (ICD-724.2) CHRONIC PAIN SYNDROME (ICD-338.4) - she has chronic pain- mostly from her arthritic condition- and was started on MSContin by her LMD in [place] in the summer of 2011> dose slowly increased & she was on 90mg  Bid (taking a 60mg  tab + 30mg  tab Bid)... she does not believe that this med has anything to do w/ her fall & wrist fx, or her complaints of decr memory etc... we discussed the need to wean off this medication & try to deal w/ her arthritis pain thru an Orthopedist or poss a Pain Clinic... ~  11/11:  she notes pain worse during the day while up & about> try to decr MSContin to 90mg AM & 60mg PM... ~  1/12 & 4/12:  she is down to 60mg Bid & we discussed weaning further ==> she was down to 45mg  (30+15) Bid by the  time of her right THR... ~  6/12:  Recommend that she try to wean further> try 45am & 30pm... ~  7/12:  She's down to 30mg  Bid & will wean further as able... ~  10/12:  She remains on the MSContin at 30mg Bid... ~  12/12:  We discussed further slow wean down to 30-15 first... ~  2/13:  We discussed trying to wean down to 15mg  Bid over the next few months,  but she was unable to do so; states that DrBlackman feels it's due to her back... ~  8/13:  She is down to MSContin 15mg Bid since her recent hosp...  OSTEOPENIA (ICD-733.90) - XRays here showed severe osteopenia... she notes that she used to be on Fosamax but ?how long? & when stopped?... she states that she was told her prev BMD was "good"... she has not been taking calcium supplements, but was prev on some OTC Vit D supplements... asked to resume Calcium, Women's MVI, Vit D 1000u daily...  Hx of DEPRESSION (ICD-311) - on CELEXA 20mg /d and she wants to continue this med... INSOMNIA >> on AMBIEN 10mg - 1/2 tab as needed...  ANEMIA, MILD (ICD-285.9) - Hg post op 11/11 wrist fx surg = 11.2 ~  6/12:  Labs showed Hg= 9.1, MCV= 77, Fe= 15 (7%sat), & she has B pos blood> we decided to incr Fe to Bid (may need IV Fe if not responding). ~  7/12:  Labs showed Hg= 10.0, MCV= 76, she reports stool checks at [facility] were NEG... ~  8/12: she reports that she had f/u Heme eval DrMohammed & "he released me" on Fe + VitC daily... ~  10/12:  Labs post hosp showed Hg= 11.0, MCV= 87 ~  Labs 5/13 showed Hg= 12.3   Past Surgical History  Procedure Date  . Appendectomy   . Breast surgery   . Right wrist surgery   . Bilateral cataracts   . Hip surgery   . Tonsillectomy   . Adenoidectomy     Outpatient Encounter Prescriptions as of 10/09/2011  Medication Sig Dispense Refill  . aspirin 81 MG EC tablet Take 81 mg by mouth daily.        . Calcium Carbonate-Vit D-Min (CALTRATE 600+D PLUS) 600-400 MG-UNIT per tablet Chew 1 tablet by mouth 2 (two) times daily.       . carvedilol (COREG) 6.25 MG tablet Take 0.5 tablets (3.125 mg total) by mouth 2 (two) times daily with a meal.  60 tablet  0  . cholecalciferol (VITAMIN D) 1000 UNITS tablet Take 2,000 Units by mouth daily.      . citalopram (CELEXA) 20 MG tablet Take 1 tablet (20 mg total) by mouth daily.  30 tablet  6  . clotrimazole-betamethasone (LOTRISONE)  cream Apply 1 application topically 2 (two) times daily as needed. For rashes on forehead.      . ferrous sulfate 325 (65 FE) MG tablet Take 325 mg by mouth 2 (two) times daily.        . Fluticasone-Salmeterol (ADVAIR DISKUS) 100-50 MCG/DOSE AEPB Inhale 1 puff into the lungs every 12 (twelve) hours.  60 each  11  . furosemide (LASIX) 40 MG tablet Take 1 tablet (40 mg total) by mouth daily.  30 tablet  6  . loratadine (CLARITIN) 10 MG tablet Take 10 mg by mouth daily.        Marland Kitchen morphine (MS CONTIN) 15 MG 12 hr tablet Take 1 tablet (15 mg total) by mouth 2 (two) times daily.  60  tablet  0  . Multiple Vitamin (MULTIVITAMIN WITH MINERALS) TABS Take 1 tablet by mouth daily.      . Naproxen Sodium (ALEVE) 220 MG CAPS Take 1 capsule by mouth 2 (two) times daily.      Marland Kitchen spironolactone (ALDACTONE) 25 MG tablet Take 12.5 mg by mouth daily.       Marland Kitchen tiotropium (SPIRIVA) 18 MCG inhalation capsule Place 1 capsule (18 mcg total) into inhaler and inhale daily.  30 capsule  11  . vitamin C (ASCORBIC ACID) 500 MG tablet Take 500 mg by mouth daily.        Marland Kitchen zolpidem (AMBIEN) 10 MG tablet Take 0.5 tablets (5 mg total) by mouth at bedtime as needed for sleep.  30 tablet  5  . lisinopril (PRINIVIL,ZESTRIL) 5 MG tablet Take 5 mg by mouth daily.        Allergies  Allergen Reactions  . Sulfonamide Derivatives Swelling    Current Medications, Allergies, Past Medical History, Past Surgical History, Family History, and Social History were reviewed in [employer] record.    Review of Systems: Constitutional:  Denies F/C/S, anorexia, unexpected weight change. HEENT:  No HA, visual changes, earache, nasal symptoms, sore throat, hoarseness. Resp:  No cough, sputum, hemoptysis; mild SOB/ DOE noted... Cardio:  No CP, palpit, orthopnea;  +edema & DOE but limited mobility. GI:  Denies N/V/D, tends toward constip due to narcotics; swallowing OK & denies reflux or abd pain.  GU:  No dysuria, freq,  urgency, hematuria, or flank pain. MS:  Severe DJD esp right hip w/ pain & decr ROM ==> s/p right THR now... Neuro:  No tremors, seizures, dizziness, syncope; +weakness & multifactorial gait abn. Skin:  No suspicious lesions or skin rash. Heme:  No adenopathy, bruising, bleeding. Psyche: Denies confusion, sleep disturbance, hallucinations; +anxiety & situational depression.    Objective:  Physical Exam:  WD, WN, 78 y/o WF chr ill appearing but in NAD... Vital Signs:  Reviewed... General:  Alert & oriented; pleasant & cooperative... HEENT:  [place]/AT, EOM-wnl, PERRLA, EACs-clear, TMs-wnl, NOSE-clear, THROAT-clear & wnl. Neck:  Supple w/ decr ROM; no JVD; normal carotid impulses w/o bruits; no thyromegaly or nodules palpated; no lymphadenopathy. Chest:  Clear to P & A; without wheezes/ rales/ or rhonchi heard... Heart:  Regular Rhythm; norm S1 & S2 without murmurs/ rubs/ or gallops detected... Abdomen:  Soft & nontender; normal bowel sounds; no organomegaly or masses palpated... Ext:  decrROM; +deformities & mod arthritic changes; no varicose veins, +venous insuffic & 1+edema;  Pulses intact w/o bruits... s/p right THR w/ revision; ambulates w/ walker... Neuro:  CNs intact;  No focal neuro deficits, in wheelchair & painful standing & walking... Derm:  No lesions noted; no rash etc... Lymph:  No cervical, supraclavicular, axillary, or inguinal adenopathy palpated...   RADIOLOGY DATA:  Reviewed in the EPIC EMR & discussed w/ the patient...  LABORATORY DATA:  Reviewed in the EPIC EMR & discussed w/ the patient...   Assessment & Plan:    Acute on Chr Resp Failure>  Multifactorial w/ diastolic CHF, elev right hemidiaph, atx, scoliosis, restrictive lung dis, narcotic pain meds- all playing a role... O2 sats are now improved & she can decr the oxygen to as needed at rest & 2L/min w/ exercise...  Elev right hemidiaph>  This is chronic & assoc w/ some mild basilar atx; encouraged to get deep  breaths & expand well...  HBP, etc>  She was felt to have orthostatic syncope & meds  adjusted down- now off Lasix, taking Aldactone 12.5mg /d + Coreg 3.125Bid & Lisinopril 5mg /d...  Hx of Myopericarditis w/ abn EKG/ Enz & subseq vol overload w/ abn 2DEcho>  She improved back on her diuretic & meds were adjusted in the CHF clinic; repeat 2DEcho 12/12 w/ recovery of LVF & back to her baseline... F/u 2DEcho 8/13= improved w/ EF=65-70%, Gr1DD...  Diastolic CHF>  She is now OFF the Lasix due to Adm w/ syncope...  VI, Edema>  REC- no salt, elevate legs, TED hose, etc...  CHOL>  ?Mevacor on her list, but not currently taking, we will try to sort this out & f/u FLP later...  GERD, Dysphagia>  She had EGD during prev hosp w/ HH, gastritis & neg HPylori;  Prev on PPI Rx but off now & she feels she is OK, we will follow.  Hx of Colitis- presumed infectious>  Resolved w/ Cipro/ Flagyl; off meds and back to baseline GI status she feels...  Divertics, Constip>  Related to her narcotic analgesics; improved w/ laxatives OTC...  DJD, s/p right THR, Chr Pain Syndrome> Trying to wean MSContin further which should be poss based on the fact that the painful right hip has been replaced...  Other medical problems as noted...    GYN> pt c/o spotting & has pessary in place since her last check in [place]; we will help her get a gyn appt ASAP for eval & check up...    Derm> she has a seborrheic dermatitis rash & we will Rx w/ Lotrisone cream..   Patient's Medications  New Prescriptions   No medications on file  Previous Medications   ASPIRIN 81 MG EC TABLET    Take 81 mg by mouth daily.     CALCIUM CARBONATE-VIT D-MIN (CALTRATE 600+D PLUS) 600-400 MG-UNIT PER TABLET    Chew 1 tablet by mouth 2 (two) times daily.    CARVEDILOL (COREG) 6.25 MG TABLET    Take 0.5 tablets (3.125 mg total) by mouth 2 (two) times daily with a meal.   CHOLECALCIFEROL (VITAMIN D) 1000 UNITS TABLET    Take 2,000 Units by mouth daily.     CITALOPRAM (CELEXA) 20 MG TABLET    Take 1 tablet (20 mg total) by mouth daily.   CLOTRIMAZOLE-BETAMETHASONE (LOTRISONE) CREAM    Apply 1 application topically 2 (two) times daily as needed. For rashes on forehead.   FERROUS SULFATE 325 (65 FE) MG TABLET    Take 325 mg by mouth 2 (two) times daily.     FLUTICASONE-SALMETEROL (ADVAIR DISKUS) 100-50 MCG/DOSE AEPB    Inhale 1 puff into the lungs every 12 (twelve) hours.   FUROSEMIDE (LASIX) 40 MG TABLET    Take 1 tablet (40 mg total) by mouth daily.   LISINOPRIL (PRINIVIL,ZESTRIL) 5 MG TABLET    Take 5 mg by mouth daily.   LORATADINE (CLARITIN) 10 MG TABLET    Take 10 mg by mouth daily.     MORPHINE (MS CONTIN) 15 MG 12 HR TABLET    Take 1 tablet (15 mg total) by mouth 2 (two) times daily.   MULTIPLE VITAMIN (MULTIVITAMIN WITH MINERALS) TABS    Take 1 tablet by mouth daily.   NAPROXEN SODIUM (ALEVE) 220 MG CAPS    Take 1 capsule by mouth 2 (two) times daily.   SPIRONOLACTONE (ALDACTONE) 25 MG TABLET    Take 12.5 mg by mouth daily.    TIOTROPIUM (SPIRIVA) 18 MCG INHALATION CAPSULE    Place 1 capsule (18 mcg total)  into inhaler and inhale daily.   VITAMIN C (ASCORBIC ACID) 500 MG TABLET    Take 500 mg by mouth daily.     ZOLPIDEM (AMBIEN) 10 MG TABLET    Take 0.5 tablets (5 mg total) by mouth at bedtime as needed for sleep.  Modified Medications   No medications on file  Discontinued Medications   No medications on file

## 2011-10-09 NOTE — Patient Instructions (Addendum)
Today we updated your med list in our EPIC system...    Continue your current medications the same...  I rec continuing the Aldactone25mg - 1/2 tab daily & leave the Lasix off as you are doing...  Clean & dress the leg wound as we discussed & call for any problems...  We gave you the 2013 Flu vaccine today...  Let's plan a follow up visit in 6-8 weeks w/ f/u blood work at that time.Marland KitchenMarland Kitchen

## 2011-10-17 ENCOUNTER — Ambulatory Visit (HOSPITAL_COMMUNITY): Payer: Medicare Other

## 2011-10-24 ENCOUNTER — Telehealth: Payer: Self-pay | Admitting: Pulmonary Disease

## 2011-10-24 MED ORDER — SPIRONOLACTONE 25 MG PO TABS: 12.5000 mg | ORAL_TABLET | Freq: Every day | ORAL | Status: DC

## 2011-10-24 NOTE — Telephone Encounter (Signed)
I spoke with Katie Galvan and he requesting for SN to refill spironolactone 25 mg 1/2 tab daily. Per [facility] requested this from Dr. Lowell Guitar and won't refill this for them. Please advise thanks

## 2011-10-24 NOTE — Telephone Encounter (Signed)
Per SN---ok to refill the spironlactone  25 mg  #30  With 5 refills. Thanks.

## 2011-10-24 NOTE — Telephone Encounter (Signed)
I spoke with [name] and is aware RX has been sent. Nothing further was needed

## 2011-10-26 ENCOUNTER — Ambulatory Visit (HOSPITAL_COMMUNITY)
Admission: RE | Admit: 2011-10-26 | Discharge: 2011-10-26 | Disposition: A | Discharge status: Home / Self Care | Payer: Medicare Other | Source: Ambulatory Visit | Attending: Internal Medicine | Admitting: Internal Medicine

## 2011-10-26 VITALS — BP 108/61 | HR 84 | Wt 143.8 lb

## 2011-10-26 DIAGNOSIS — I509 Heart failure, unspecified: Secondary | ICD-10-CM

## 2011-10-26 DIAGNOSIS — R55 Syncope and collapse: Secondary | ICD-10-CM

## 2011-10-26 DIAGNOSIS — I5032 Chronic diastolic (congestive) heart failure: Secondary | ICD-10-CM

## 2011-10-26 NOTE — Patient Instructions (Addendum)
Stop spironolactone.  Use lasix as needed for swelling.  Pick up event monitor.   Follow up 1 month.

## 2011-10-26 NOTE — Assessment & Plan Note (Addendum)
Volume status well controlled.  With continued syncope and hypotension will discontinue spironolactone.  Have discussed use of sliding scale lasix, she voices understanding.  Continue O2 use.   Patient seen and examined with Ulyess Blossom, PA-C. We discussed all aspects of the encounter. I agree with the assessment and plan as stated above.  Agree. Syncope/dizziness not clearly orthostatic but given low BP will stop spiro. Reinforced need for daily weights and reviewed use of sliding scale diuretics. Will see back soon.

## 2011-10-26 NOTE — Assessment & Plan Note (Addendum)
Orthostatics negative in clinic today although this could be etiology of syncope vs bradycardia.  Will have her pick up holter monitor next week.  She states she will pick up the monitor this time.  Patient seen and examined with Ulyess Blossom, PA-C. We discussed all aspects of the encounter. I agree with the assessment and plan as stated above.  Etiology of syncope/dizziness unclear. We checked orthostatics in clinic today and they were normal. I am not convinced this is cardiac. Will place event monitor.

## 2011-10-26 NOTE — Progress Notes (Signed)
PCP/Pulmonologist: Dr [name]  HPI:  Katie Galvan is an 78 yo with history of mixed diastolic and systolic HF, LVEF 30%, restrictive lung disease, right hemidiaphragm elevation, COPD, GERD, history of endocarditis recently admitted for colitis requiring cipro/flagyl.  Presumed myocarditis treated with colchicine.  Echo 10/10/10: Hyperdynamic basal function Septal apical anterior hypokinesis The cavity size was severely dilated.  Wall thickness was increased in a pattern of moderate LVH.  There was mild concentric hypertrophy. The estimated ejection fraction was 30%.     12/12 ECHO EF recovered  55-60%  Echo 08/03/11 EF 65-70%.  Grade 1 diastolic dysfunction.  Mild MR.  Mildly dilated LA  She returns for follow up today with her daughter.  She has had an episode of syncope since last visit.  She is a poor historian but believes it may have been when she was getting up one morning to go to the bathroom.  She also is unsure if she lost consciousness but was on the floor and had to call for help.  She has a scratch on her leg.  Her dyspnea is at baseline.  Denies dizziness.  She does c/o nausea and sometimes just feeling poorly.  She cannot relate this to time of day or meals.  She is unsure what makes it better or worse.      ROS: All other systems normal except as mentioned in HPI, past medical history and problem list.    Past Medical History  Diagnosis Date  . Disorders of diaphragm   . Unspecified essential hypertension   . Endocarditis, valve unspecified, unspecified cause   . Cardiac dysrhythmia, unspecified   . Unspecified venous (peripheral) insufficiency   . Edema   . Pure hypercholesterolemia   . Other dysphagia   . Diverticulosis of colon (without mention of hemorrhage)   . Benign neoplasm of colon   . Osteoarthrosis, unspecified whether generalized or localized, unspecified site   . Lumbago   . Chronic pain syndrome   . Disorder of bone and cartilage, unspecified   . Depressive  disorder, not elsewhere classified   . Anemia, unspecified   . Skin cancer   . Systolic heart failure     echo 10/10/10 EF 30%    Current Outpatient Prescriptions  Medication Sig Dispense Refill  . aspirin 81 MG EC tablet Take 81 mg by mouth daily.        . Calcium Carbonate-Vit D-Min (CALTRATE 600+D PLUS) 600-400 MG-UNIT per tablet Chew 1 tablet by mouth 2 (two) times daily.       . carvedilol (COREG) 6.25 MG tablet Take 0.5 tablets (3.125 mg total) by mouth 2 (two) times daily with a meal.  60 tablet  11  . cholecalciferol (VITAMIN D) 1000 UNITS tablet Take 2,000 Units by mouth daily.      . citalopram (CELEXA) 20 MG tablet Take 1 tablet (20 mg total) by mouth daily.  30 tablet  11  . clotrimazole-betamethasone (LOTRISONE) cream Apply 1 application topically 2 (two) times daily as needed. For rashes on forehead.      . ferrous sulfate 325 (65 FE) MG tablet Take 325 mg by mouth 2 (two) times daily.        . Fluticasone-Salmeterol (ADVAIR DISKUS) 100-50 MCG/DOSE AEPB Inhale 1 puff into the lungs every 12 (twelve) hours.  60 each  11  . furosemide (LASIX) 40 MG tablet Take 1 tablet (40 mg total) by mouth daily.  30 tablet  6  . lisinopril (PRINIVIL,ZESTRIL) 5 MG tablet  Take 5 mg by mouth daily.      Marland Kitchen loratadine (CLARITIN) 10 MG tablet Take 10 mg by mouth daily.        Marland Kitchen morphine (MS CONTIN) 15 MG 12 hr tablet Take 1 tablet (15 mg total) by mouth 2 (two) times daily.  60 tablet  0  . Multiple Vitamin (MULTIVITAMIN WITH MINERALS) TABS Take 1 tablet by mouth daily.      . Naproxen Sodium (ALEVE) 220 MG CAPS Take 1 capsule by mouth 2 (two) times daily.      Marland Kitchen spironolactone (ALDACTONE) 25 MG tablet Take 0.5 tablets (12.5 mg total) by mouth daily.  30 tablet  5  . tiotropium (SPIRIVA) 18 MCG inhalation capsule Place 1 capsule (18 mcg total) into inhaler and inhale daily.  30 capsule  11  . vitamin C (ASCORBIC ACID) 500 MG tablet Take 500 mg by mouth daily.        Marland Kitchen zolpidem (AMBIEN) 10 MG tablet  Take 0.5 tablets (5 mg total) by mouth at bedtime as needed for sleep.  30 tablet  5  Lasix only as needed.   Allergies  Allergen Reactions  . Sulfonamide Derivatives Swelling    PHYSICAL EXAM: Filed Vitals:   10/26/11 1539  BP: 94/60  Pulse: 80  Weight: 143 lb 12 oz (65.205 kg)  SpO2: 96%     General:  Well appearing. No respiratory difficulty, on O2. HEENT: normal Neck: supple. JVP 6- 7. Carotids 2+ bilat; no bruits. No lymphadenopathy or thryomegaly appreciated. Cor: PMI nondisplaced. Regular rate & rhythm. No rubs, gallops or murmurs. Lungs: CTA Abdomen: soft, nontender, nondistended. No hepatosplenomegaly. No bruits or masses. Good bowel sounds. Extremities: no cyanosis, clubbing, rash, edema, resolving hematoma Lt distal shin Neuro: alert & oriented x 3, cranial nerves grossly intact. moves all 4 extremities w/o difficulty. Affect pleasant.   ASSESSMENT & PLAN:

## 2011-10-30 ENCOUNTER — Telehealth: Payer: Self-pay | Admitting: Pulmonary Disease

## 2011-10-30 DIAGNOSIS — R197 Diarrhea, unspecified: Secondary | ICD-10-CM

## 2011-10-30 DIAGNOSIS — R109 Unspecified abdominal pain: Secondary | ICD-10-CM

## 2011-10-30 MED ORDER — METRONIDAZOLE 500 MG PO TABS: 500.0000 mg | ORAL_TABLET | Freq: Three times a day (TID) | ORAL | Status: DC

## 2011-10-30 NOTE — Telephone Encounter (Signed)
Called, spoke with Corrie Dandy who states pt was seen last week by Dr. Gala Romney. Per Corrie Dandy, pt "keeps falling," so cards is thinking this may be from a change in her BP and ?ing if pt needs a pacemaker.  Corrie Dandy states pt is unable to wear the heart monitor to figure this out d/t abd cramping and diarrhea.  Per Corrie Dandy, this has been going on x 1 1/2 months but pt "doesn't tell [name] how badly she feels" during her OVs.  Corrie Dandy states pt will have 1-2 good days in between but then symptoms start again.  Pt has tried imodium which helps but then she gets sick again.  Corrie Dandy thinks pt has vomited with these symptoms since they started and states she doesn't each much from being uncomfortable.  No f/c/s that Corrie Dandy is aware of.  States pt does not have a GI dr -- requesting recs from [name].  Pls advise.  Thank you.  Allergies  Allergen Reactions  . Sulfonamide Derivatives Swelling

## 2011-10-30 NOTE — Telephone Encounter (Signed)
Per SN----needs to bring in stool specimen to the lab.  Katie Galvan will come to the lab in the am and pick up the specimen cups for this.  She is aware to start the flagyl 500 mg # 30  1 po tid until gone, cont the align once daily and take the activia yogurt once daily.  We will set her up to see GI for eval of the abd pain.  Katie Galvan is aware of SN recs and nothing further is needed.

## 2011-10-31 ENCOUNTER — Telehealth: Payer: Self-pay | Admitting: Gastroenterology

## 2011-10-31 ENCOUNTER — Other Ambulatory Visit: Payer: Self-pay | Admitting: Pulmonary Disease

## 2011-10-31 ENCOUNTER — Telehealth: Payer: Self-pay | Admitting: Pulmonary Disease

## 2011-10-31 ENCOUNTER — Emergency Department (HOSPITAL_COMMUNITY): Payer: Medicare Other

## 2011-10-31 ENCOUNTER — Inpatient Hospital Stay (HOSPITAL_COMMUNITY)
Admission: EM | Admit: 2011-10-31 | Discharge: 2011-11-03 | DRG: 391 | Disposition: A | Discharge status: Home Health | Payer: Medicare Other | Attending: Internal Medicine | Admitting: Internal Medicine

## 2011-10-31 ENCOUNTER — Encounter (HOSPITAL_COMMUNITY): Payer: Self-pay | Admitting: *Deleted

## 2011-10-31 DIAGNOSIS — Z882 Allergy status to sulfonamides status: Secondary | ICD-10-CM

## 2011-10-31 DIAGNOSIS — G894 Chronic pain syndrome: Secondary | ICD-10-CM

## 2011-10-31 DIAGNOSIS — J9611 Chronic respiratory failure with hypoxia: Secondary | ICD-10-CM

## 2011-10-31 DIAGNOSIS — J4489 Other specified chronic obstructive pulmonary disease: Secondary | ICD-10-CM | POA: Diagnosis present

## 2011-10-31 DIAGNOSIS — I38 Endocarditis, valve unspecified: Secondary | ICD-10-CM

## 2011-10-31 DIAGNOSIS — I5042 Chronic combined systolic (congestive) and diastolic (congestive) heart failure: Secondary | ICD-10-CM

## 2011-10-31 DIAGNOSIS — E7800 Pure hypercholesterolemia, unspecified: Secondary | ICD-10-CM

## 2011-10-31 DIAGNOSIS — D490 Neoplasm of unspecified behavior of digestive system: Secondary | ICD-10-CM | POA: Diagnosis present

## 2011-10-31 DIAGNOSIS — R197 Diarrhea, unspecified: Secondary | ICD-10-CM

## 2011-10-31 DIAGNOSIS — M949 Disorder of cartilage, unspecified: Secondary | ICD-10-CM | POA: Diagnosis present

## 2011-10-31 DIAGNOSIS — M1990 Unspecified osteoarthritis, unspecified site: Secondary | ICD-10-CM

## 2011-10-31 DIAGNOSIS — Z9981 Dependence on supplemental oxygen: Secondary | ICD-10-CM

## 2011-10-31 DIAGNOSIS — M545 Low back pain, unspecified: Secondary | ICD-10-CM

## 2011-10-31 DIAGNOSIS — Z860100 Personal history of colon polyps, unspecified: Secondary | ICD-10-CM

## 2011-10-31 DIAGNOSIS — K5730 Diverticulosis of large intestine without perforation or abscess without bleeding: Secondary | ICD-10-CM

## 2011-10-31 DIAGNOSIS — J449 Chronic obstructive pulmonary disease, unspecified: Secondary | ICD-10-CM | POA: Diagnosis present

## 2011-10-31 DIAGNOSIS — Z79899 Other long term (current) drug therapy: Secondary | ICD-10-CM

## 2011-10-31 DIAGNOSIS — I309 Acute pericarditis, unspecified: Secondary | ICD-10-CM

## 2011-10-31 DIAGNOSIS — K449 Diaphragmatic hernia without obstruction or gangrene: Secondary | ICD-10-CM | POA: Diagnosis present

## 2011-10-31 DIAGNOSIS — K5289 Other specified noninfective gastroenteritis and colitis: Secondary | ICD-10-CM

## 2011-10-31 DIAGNOSIS — I5033 Acute on chronic diastolic (congestive) heart failure: Secondary | ICD-10-CM

## 2011-10-31 DIAGNOSIS — M899 Disorder of bone, unspecified: Secondary | ICD-10-CM

## 2011-10-31 DIAGNOSIS — R1319 Other dysphagia: Secondary | ICD-10-CM

## 2011-10-31 DIAGNOSIS — R001 Bradycardia, unspecified: Secondary | ICD-10-CM

## 2011-10-31 DIAGNOSIS — K529 Noninfective gastroenteritis and colitis, unspecified: Secondary | ICD-10-CM

## 2011-10-31 DIAGNOSIS — R609 Edema, unspecified: Secondary | ICD-10-CM

## 2011-10-31 DIAGNOSIS — Z85828 Personal history of other malignant neoplasm of skin: Secondary | ICD-10-CM

## 2011-10-31 DIAGNOSIS — I10 Essential (primary) hypertension: Secondary | ICD-10-CM

## 2011-10-31 DIAGNOSIS — J9610 Chronic respiratory failure, unspecified whether with hypoxia or hypercapnia: Secondary | ICD-10-CM | POA: Diagnosis present

## 2011-10-31 DIAGNOSIS — I5032 Chronic diastolic (congestive) heart failure: Secondary | ICD-10-CM

## 2011-10-31 DIAGNOSIS — J986 Disorders of diaphragm: Secondary | ICD-10-CM

## 2011-10-31 DIAGNOSIS — F3289 Other specified depressive episodes: Secondary | ICD-10-CM

## 2011-10-31 DIAGNOSIS — F329 Major depressive disorder, single episode, unspecified: Secondary | ICD-10-CM

## 2011-10-31 DIAGNOSIS — K8689 Other specified diseases of pancreas: Secondary | ICD-10-CM

## 2011-10-31 DIAGNOSIS — Z9089 Acquired absence of other organs: Secondary | ICD-10-CM

## 2011-10-31 DIAGNOSIS — D649 Anemia, unspecified: Secondary | ICD-10-CM

## 2011-10-31 DIAGNOSIS — K219 Gastro-esophageal reflux disease without esophagitis: Secondary | ICD-10-CM | POA: Diagnosis present

## 2011-10-31 DIAGNOSIS — Z7982 Long term (current) use of aspirin: Secondary | ICD-10-CM

## 2011-10-31 DIAGNOSIS — I509 Heart failure, unspecified: Secondary | ICD-10-CM | POA: Diagnosis present

## 2011-10-31 DIAGNOSIS — I059 Rheumatic mitral valve disease, unspecified: Secondary | ICD-10-CM | POA: Diagnosis present

## 2011-10-31 DIAGNOSIS — Z9181 History of falling: Secondary | ICD-10-CM

## 2011-10-31 DIAGNOSIS — K5732 Diverticulitis of large intestine without perforation or abscess without bleeding: Principal | ICD-10-CM

## 2011-10-31 DIAGNOSIS — R4182 Altered mental status, unspecified: Secondary | ICD-10-CM

## 2011-10-31 DIAGNOSIS — R55 Syncope and collapse: Secondary | ICD-10-CM

## 2011-10-31 DIAGNOSIS — I872 Venous insufficiency (chronic) (peripheral): Secondary | ICD-10-CM

## 2011-10-31 DIAGNOSIS — Z23 Encounter for immunization: Secondary | ICD-10-CM

## 2011-10-31 DIAGNOSIS — J9620 Acute and chronic respiratory failure, unspecified whether with hypoxia or hypercapnia: Secondary | ICD-10-CM

## 2011-10-31 DIAGNOSIS — R0600 Dyspnea, unspecified: Secondary | ICD-10-CM

## 2011-10-31 DIAGNOSIS — D126 Benign neoplasm of colon, unspecified: Secondary | ICD-10-CM

## 2011-10-31 DIAGNOSIS — N390 Urinary tract infection, site not specified: Secondary | ICD-10-CM

## 2011-10-31 DIAGNOSIS — I951 Orthostatic hypotension: Secondary | ICD-10-CM

## 2011-10-31 LAB — URINE MICROSCOPIC-ADD ON

## 2011-10-31 LAB — URINALYSIS, ROUTINE W REFLEX MICROSCOPIC
Bilirubin Urine: NEGATIVE
Ketones, ur: 15 mg/dL — AB
Nitrite: POSITIVE — AB
Protein, ur: 30 mg/dL — AB
Urobilinogen, UA: 1 mg/dL (ref 0.0–1.0)
pH: 6 (ref 5.0–8.0)

## 2011-10-31 LAB — COMPREHENSIVE METABOLIC PANEL
AST: 14 U/L (ref 0–37)
Albumin: 2.9 g/dL — ABNORMAL LOW (ref 3.5–5.2)
BUN: 15 mg/dL (ref 6–23)
Creatinine, Ser: 0.78 mg/dL (ref 0.50–1.10)
Potassium: 3.9 mEq/L (ref 3.5–5.1)
Total Protein: 7 g/dL (ref 6.0–8.3)

## 2011-10-31 LAB — CBC WITH DIFFERENTIAL/PLATELET
Basophils Absolute: 0.1 10*3/uL (ref 0.0–0.1)
Basophils Relative: 1 % (ref 0–1)
Eosinophils Absolute: 0.3 10*3/uL (ref 0.0–0.7)
Hemoglobin: 10.3 g/dL — ABNORMAL LOW (ref 12.0–15.0)
MCH: 27.3 pg (ref 26.0–34.0)
MCHC: 31.7 g/dL (ref 30.0–36.0)
Monocytes Relative: 8 % (ref 3–12)
Neutrophils Relative %: 81 % — ABNORMAL HIGH (ref 43–77)
RDW: 15 % (ref 11.5–15.5)

## 2011-10-31 MED ORDER — METRONIDAZOLE IN NACL 5-0.79 MG/ML-% IV SOLN
500.0000 mg | Freq: Once | INTRAVENOUS | Status: AC
  Administered 2011-10-31: 500 mg via INTRAVENOUS
  Filled 2011-10-31: qty 100

## 2011-10-31 MED ORDER — SODIUM CHLORIDE 0.9 % IV BOLUS (SEPSIS)
500.0000 mL | Freq: Once | INTRAVENOUS | Status: AC
  Administered 2011-10-31: 500 mL via INTRAVENOUS

## 2011-10-31 MED ORDER — IOHEXOL 300 MG/ML  SOLN
100.0000 mL | Freq: Once | INTRAMUSCULAR | Status: AC | PRN
  Administered 2011-10-31: 100 mL via INTRAVENOUS

## 2011-10-31 MED ORDER — ONDANSETRON HCL 4 MG/2ML IJ SOLN
4.0000 mg | Freq: Once | INTRAMUSCULAR | Status: AC
  Administered 2011-10-31: 4 mg via INTRAVENOUS
  Filled 2011-10-31: qty 2

## 2011-10-31 MED ORDER — ONDANSETRON HCL 4 MG/2ML IJ SOLN: 4.0000 mg | Freq: Three times a day (TID) | INTRAMUSCULAR | Status: AC | PRN

## 2011-10-31 MED ORDER — IOHEXOL 300 MG/ML  SOLN
20.0000 mL | INTRAMUSCULAR | Status: AC
  Administered 2011-10-31 (×2): 20 mL via ORAL

## 2011-10-31 MED ORDER — MORPHINE SULFATE 2 MG/ML IJ SOLN
2.0000 mg | Freq: Once | INTRAMUSCULAR | Status: AC
  Administered 2011-10-31: 2 mg via INTRAVENOUS
  Filled 2011-10-31: qty 1

## 2011-10-31 MED ORDER — SODIUM CHLORIDE 0.9 % IV SOLN
INTRAVENOUS | Status: AC
  Administered 2011-11-01: 01:00:00 via INTRAVENOUS

## 2011-10-31 MED ORDER — CIPROFLOXACIN IN D5W 400 MG/200ML IV SOLN
400.0000 mg | Freq: Once | INTRAVENOUS | Status: AC
  Administered 2011-11-01: 400 mg via INTRAVENOUS
  Filled 2011-10-31: qty 200

## 2011-10-31 NOTE — Telephone Encounter (Signed)
I spoke with [name] and he stated his wife is on their way to [facility] ED with pt. She has been c/o not feeling well, abdominal cramping, not eating. This has been getting worse x 3 days now. Per [name] the ABX SN sent to the pharmacy today they will not pick up since pt is going to the ED. I advised will forward to SN as an FYI.

## 2011-10-31 NOTE — Telephone Encounter (Signed)
This is a Dr Juanda Chance [name]

## 2011-10-31 NOTE — ED Notes (Signed)
Placed pt on bedpan for voiding, pt tolerated procedure well. Plan of care is updated with verbal understanding including diet orders of nothing by mouth. Pt verbalized understanding and will continue to monitor pt.

## 2011-10-31 NOTE — ED Notes (Addendum)
Pt states she has been feeling "lowsy" on and off for about a week. It got worse over the last two days. Denies vomiting and diarrhea. States she has abdominal cramping, rates the pain a 8/10. Pt's doctor prescribed her Metronidazole for her stomach cramping she took one around 1315.

## 2011-10-31 NOTE — ED Notes (Signed)
Pt has had no episodes of n/v/d reported or observed.

## 2011-10-31 NOTE — ED Provider Notes (Signed)
History     CSN: [id number]  Arrival date & time 10/31/11  1710   First MD Initiated Contact with Patient 10/31/11 1804      Chief Complaint  Patient presents with  . Abdominal Pain  . Nausea    (Consider location/radiation/quality/duration/timing/severity/associated sxs/prior treatment) HPI Pt with lower abd pain over last month worse since yesterday. Pain is mostly on LLQ. No N/D/C. No blood in stool. No urinary symptoms. No fever or chills. Pt has previous appendectomy.  Past Medical History  Diagnosis Date  . Disorders of diaphragm   . Unspecified essential hypertension   . Endocarditis, valve unspecified, unspecified cause   . Cardiac dysrhythmia, unspecified   . Unspecified venous (peripheral) insufficiency   . Edema   . Pure hypercholesterolemia   . Other dysphagia   . Diverticulosis of colon (without mention of hemorrhage)   . Benign neoplasm of colon   . Osteoarthrosis, unspecified whether generalized or localized, unspecified site   . Lumbago   . Chronic pain syndrome   . Disorder of bone and cartilage, unspecified   . Depressive disorder, not elsewhere classified   . Anemia, unspecified   . Skin cancer   . Systolic heart failure     echo 10/10/10 EF 30%    Past Surgical History  Procedure Date  . Appendectomy   . Breast surgery   . Right wrist surgery   . Bilateral cataracts   . Hip surgery   . Tonsillectomy   . Adenoidectomy     Family History  Problem Relation Age of Onset  . Diabetes Mother   . Heart disease Father   . Lung cancer Sister     History  Substance Use Topics  . Smoking status: Never Smoker   . Smokeless tobacco: Never Used  . Alcohol Use: No    OB History    Grav Para Term Preterm Abortions TAB SAB Ect Mult Living   2 2 1 1      2       Review of Systems  Constitutional: Negative for fever, chills and fatigue.  HENT: Negative for neck pain.   Respiratory: Negative for shortness of breath and wheezing.     Cardiovascular: Negative for chest pain and palpitations.  Gastrointestinal: Positive for abdominal pain. Negative for vomiting, diarrhea and constipation.  Genitourinary: Negative for dysuria, frequency and flank pain.  Musculoskeletal: Negative for back pain.  Skin: Negative for rash and wound.  Neurological: Negative for dizziness, weakness, light-headedness, numbness and headaches.    Allergies  Sulfonamide derivatives  Home Medications   Current Outpatient Rx  Name Route Sig Dispense Refill  . ASPIRIN 81 MG PO TBEC Oral Take 81 mg by mouth daily.      Marland Kitchen CALTRATE 600+D PLUS 600-400 MG-UNIT PO CHEW Oral Chew 1 tablet by mouth 2 (two) times daily.     Marland Kitchen CARVEDILOL 6.25 MG PO TABS Oral Take 0.5 tablets (3.125 mg total) by mouth 2 (two) times daily with a meal. 60 tablet 11  . VITAMIN D 1000 UNITS PO TABS Oral Take 2,000 Units by mouth daily.    Marland Kitchen CITALOPRAM HYDROBROMIDE 20 MG PO TABS Oral Take 1 tablet (20 mg total) by mouth daily. 30 tablet 11  . CLOTRIMAZOLE-BETAMETHASONE 1-0.05 % EX CREA Topical Apply 1 application topically 2 (two) times daily as needed. For rashes on forehead.    Marland Kitchen FERROUS SULFATE 325 (65 FE) MG PO TABS Oral Take 325 mg by mouth 2 (two) times daily.      Marland Kitchen  FLUTICASONE-SALMETEROL 100-50 MCG/DOSE IN AEPB Inhalation Inhale 1 puff into the lungs every 12 (twelve) hours. 60 each 11  . FUROSEMIDE 40 MG PO TABS Oral Take 40 mg by mouth daily as needed. For edema    . LISINOPRIL 5 MG PO TABS Oral Take 5 mg by mouth daily.    Marland Kitchen LORATADINE 10 MG PO TABS Oral Take 10 mg by mouth daily.      Marland Kitchen METRONIDAZOLE 500 MG PO TABS Oral Take 500 mg by mouth 3 (three) times daily. For 10 days; Start date 10/31/11    . MORPHINE SULFATE ER 15 MG PO TBCR Oral Take 1 tablet (15 mg total) by mouth 2 (two) times daily. 60 tablet 0  . ADULT MULTIVITAMIN W/MINERALS CH Oral Take 1 tablet by mouth daily.    Marland Kitchen NAPROXEN SODIUM 220 MG PO CAPS Oral Take 1 capsule by mouth 2 (two) times daily.    Marland Kitchen  TIOTROPIUM BROMIDE MONOHYDRATE 18 MCG IN CAPS Inhalation Place 1 capsule (18 mcg total) into inhaler and inhale daily. 30 capsule 11  . VITAMIN C 500 MG PO TABS Oral Take 500 mg by mouth daily.      Marland Kitchen ZOLPIDEM TARTRATE 10 MG PO TABS Oral Take 5 mg by mouth at bedtime as needed. For insomnia      BP 174/87  Pulse 66  Temp 98 F (36.7 C) (Oral)  Resp 18  SpO2 99%  Physical Exam  Nursing note and vitals reviewed. Constitutional: She is oriented to person, place, and time. She appears well-developed and well-nourished. No distress.  HENT:  Head: Normocephalic and atraumatic.  Mouth/Throat: Oropharynx is clear and moist.  Eyes: EOM are normal. Pupils are equal, round, and reactive to light.  Neck: Normal range of motion. Neck supple.  Cardiovascular: Normal rate and regular rhythm.   Pulmonary/Chest: Effort normal and breath sounds normal. No respiratory distress. She has no wheezes. She has no rales.  Abdominal: Soft. Bowel sounds are normal. She exhibits no distension and no mass. There is tenderness (TTP RLQ with minimal LLQ ttp. No rebound or guarding). There is no rebound and no guarding.  Musculoskeletal: Normal range of motion. She exhibits no edema and no tenderness.  Neurological: She is alert and oriented to person, place, and time.       Moves all ext without deficit  Skin: Skin is warm and dry. No rash noted. No erythema.  Psychiatric: She has a normal mood and affect. Her behavior is normal.    ED Course  Procedures (including critical care time)  Labs Reviewed  URINALYSIS, ROUTINE W REFLEX MICROSCOPIC - Abnormal; Notable for the following:    APPearance HAZY (*)     Ketones, ur 15 (*)     Protein, ur 30 (*)     Nitrite POSITIVE (*)     Leukocytes, UA MODERATE (*)     All other components within normal limits  CBC WITH DIFFERENTIAL - Abnormal; Notable for the following:    WBC 18.7 (*)     RBC 3.77 (*)     Hemoglobin 10.3 (*)     HCT 32.5 (*)     Platelets 574  (*)     Neutrophils Relative 81 (*)     Neutro Abs 15.1 (*)     Lymphocytes Relative 9 (*)     Monocytes Absolute 1.6 (*)     All other components within normal limits  COMPREHENSIVE METABOLIC PANEL - Abnormal; Notable for the following:  Glucose, Bld 121 (*)     Albumin 2.9 (*)     GFR calc non Af Amer 76 (*)     GFR calc Af Amer 88 (*)     All other components within normal limits  LIPASE, BLOOD - Abnormal; Notable for the following:    Lipase 7 (*)     All other components within normal limits  URINE MICROSCOPIC-ADD ON - Abnormal; Notable for the following:    Squamous Epithelial / LPF FEW (*)     Crystals CA OXALATE CRYSTALS (*)     All other components within normal limits   Ct Abdomen Pelvis W Contrast  10/31/2011  *RADIOLOGY REPORT*  Clinical Data: Right lower quadrant pain.  Lower abdominal pain and nausea.  CT ABDOMEN AND PELVIS WITH CONTRAST  Technique:  Multidetector CT imaging of the abdomen and pelvis was performed following the standard protocol during bolus administration of intravenous contrast.  Contrast: OMNIPAQUE IOHEXOL 300 MG/ML  SOLN  Comparison: CT abdomen and pelvis with contrast 10/09/2011.  Findings: Mild dependent atelectasis is present at the right lung base.  Elevation of the right hemidiaphragm is stable.  Heart size is normal.  No significant pleural or pericardial effusion is present.  The liver and spleen are within normal limits.  A small hiatal hernia is stable.  The stomach and duodenum are within normal limits.  A hypodense pancreatic mass at the uncinate process measures at least 3.7 x 1.4 cm, similar to the prior exam.  There is mild pancreatic duct dilation.  The adrenal glands are normal bilaterally.  Renal cysts are stable.  Atherosclerotic calcifications are noted in the aorta.  Fluid levels are present in the colon.  Free fluid is similar to the prior exam.  Extensive wall thickening and inflammatory changes present through the sigmoid colon.   Diverticular changes are evident in the descending colon with additional more mild inflammatory changes.  The more proximal transverse colon is unremarkable.  Small bowel is within normal limits.  Asymmetric wall thickening is noted along the urinary bladder, new from prior last exam.  The patient is status post right total hip arthroplasty. Degenerative changes are again noted in the lower lumbar spine.  IMPRESSION:  1.  Stable hypodense mass at the onset process of the pancreas may represent a cystic neoplasm.  This recommend non emergent ultrasound for further evaluation. 2.  Stable atrophic and cystic changes of the kidneys. 3.  Nonobstructing 5 mm stone at the lower pole of the left kidney. 4.  Changes compatible with sigmoid colitis, likely secondary to diverticular disease. 5.  Free fluid in the anatomic pelvis. 6.  Atherosclerosis.   Original Report Authenticated By: Jamesetta Orleans. MATTERN, M.D.      1. Colitis   2. UTI (urinary tract infection)       MDM   Discussed with [facility] and will admit for colitis/uti.Pt remains stable in ED       Loren Racer, MD 10/31/11 2241

## 2011-10-31 NOTE — ED Notes (Signed)
Son Katie Galvan is taking pt walker and oxygen home.

## 2011-10-31 NOTE — ED Notes (Signed)
Placed pt on bedpan for voiding, pt tolerated procedure well. 

## 2011-10-31 NOTE — Telephone Encounter (Signed)
Monitor placed 10/28

## 2011-10-31 NOTE — ED Notes (Signed)
Placed pt on bedpan for voiding, pt tolerated procedure well.

## 2011-10-31 NOTE — Telephone Encounter (Signed)
Scheduled patient with Katie Gip, PA on 11/02/11 at 10:30 AM. Called and left a message for [facility] with appointment date and time.

## 2011-10-31 NOTE — ED Notes (Signed)
Pt resting quietly with visitor at bedside. Pt reports still having abdominal pain after pain medications given from previous RN. Plan of care is updated with verbal understanding and will continue to monitor pt.

## 2011-10-31 NOTE — ED Notes (Signed)
PT is here with lower abdominal pain with nausea that has developed over the 1.5 months.  No pain with urination.  Normal BM

## 2011-10-31 NOTE — ED Notes (Signed)
Admitting MD at bedside, pt awaiting inpt beds assignment.  

## 2011-10-31 NOTE — ED Notes (Signed)
Katie Galvan, Son if any questions please contact [phone].

## 2011-11-01 ENCOUNTER — Encounter: Payer: Self-pay | Admitting: *Deleted

## 2011-11-01 ENCOUNTER — Encounter: Payer: Self-pay | Admitting: Physician Assistant

## 2011-11-01 DIAGNOSIS — I509 Heart failure, unspecified: Secondary | ICD-10-CM

## 2011-11-01 DIAGNOSIS — K8689 Other specified diseases of pancreas: Secondary | ICD-10-CM | POA: Diagnosis present

## 2011-11-01 DIAGNOSIS — G894 Chronic pain syndrome: Secondary | ICD-10-CM

## 2011-11-01 DIAGNOSIS — J9620 Acute and chronic respiratory failure, unspecified whether with hypoxia or hypercapnia: Secondary | ICD-10-CM

## 2011-11-01 DIAGNOSIS — I5032 Chronic diastolic (congestive) heart failure: Secondary | ICD-10-CM

## 2011-11-01 DIAGNOSIS — D649 Anemia, unspecified: Secondary | ICD-10-CM | POA: Diagnosis present

## 2011-11-01 LAB — BASIC METABOLIC PANEL
CO2: 25 mEq/L (ref 19–32)
Calcium: 8.7 mg/dL (ref 8.4–10.5)
Creatinine, Ser: 0.73 mg/dL (ref 0.50–1.10)
Glucose, Bld: 79 mg/dL (ref 70–99)

## 2011-11-01 LAB — CBC
HCT: 27.9 % — ABNORMAL LOW (ref 36.0–46.0)
HCT: 28.2 % — ABNORMAL LOW (ref 36.0–46.0)
Hemoglobin: 8.7 g/dL — ABNORMAL LOW (ref 12.0–15.0)
Hemoglobin: 8.7 g/dL — ABNORMAL LOW (ref 12.0–15.0)
MCH: 26.9 pg (ref 26.0–34.0)
MCH: 26.7 pg (ref 26.0–34.0)
MCHC: 31.2 g/dL (ref 30.0–36.0)
MCV: 86.5 fL (ref 78.0–100.0)
RBC: 3.26 MIL/uL — ABNORMAL LOW (ref 3.87–5.11)

## 2011-11-01 MED ORDER — SODIUM CHLORIDE 0.9 % IJ SOLN
3.0000 mL | Freq: Two times a day (BID) | INTRAMUSCULAR | Status: DC
  Administered 2011-11-01 – 2011-11-02 (×3): 3 mL via INTRAVENOUS

## 2011-11-01 MED ORDER — NAPROXEN 250 MG PO TABS
250.0000 mg | ORAL_TABLET | Freq: Two times a day (BID) | ORAL | Status: DC
  Administered 2011-11-01 – 2011-11-03 (×6): 250 mg via ORAL
  Filled 2011-11-01 (×7): qty 1

## 2011-11-01 MED ORDER — PNEUMOCOCCAL VAC POLYVALENT 25 MCG/0.5ML IJ INJ
0.5000 mL | INJECTION | INTRAMUSCULAR | Status: AC
  Filled 2011-11-01: qty 0.5

## 2011-11-01 MED ORDER — LORATADINE 10 MG PO TABS
10.0000 mg | ORAL_TABLET | Freq: Every day | ORAL | Status: DC
  Administered 2011-11-01 – 2011-11-03 (×3): 10 mg via ORAL
  Filled 2011-11-01 (×3): qty 1

## 2011-11-01 MED ORDER — CHLORHEXIDINE GLUCONATE 0.12 % MT SOLN
15.0000 mL | Freq: Two times a day (BID) | OROMUCOSAL | Status: DC
  Administered 2011-11-01 – 2011-11-02 (×3): 15 mL via OROMUCOSAL
  Filled 2011-11-01 (×5): qty 15

## 2011-11-01 MED ORDER — CALCIUM CARBONATE-VITAMIN D 500-200 MG-UNIT PO TABS
1.0000 | ORAL_TABLET | Freq: Two times a day (BID) | ORAL | Status: DC
  Administered 2011-11-01 – 2011-11-03 (×6): 1 via ORAL
  Filled 2011-11-01 (×7): qty 1

## 2011-11-01 MED ORDER — TIOTROPIUM BROMIDE MONOHYDRATE 18 MCG IN CAPS
18.0000 ug | ORAL_CAPSULE | Freq: Every day | RESPIRATORY_TRACT | Status: DC
  Administered 2011-11-01 – 2011-11-03 (×3): 18 ug via RESPIRATORY_TRACT
  Filled 2011-11-01: qty 5

## 2011-11-01 MED ORDER — FUROSEMIDE 40 MG PO TABS
40.0000 mg | ORAL_TABLET | Freq: Every day | ORAL | Status: DC | PRN
  Filled 2011-11-01: qty 1

## 2011-11-01 MED ORDER — MORPHINE SULFATE ER 15 MG PO TBCR
15.0000 mg | EXTENDED_RELEASE_TABLET | Freq: Two times a day (BID) | ORAL | Status: DC
  Administered 2011-11-01: 15 mg via ORAL
  Filled 2011-11-01: qty 1

## 2011-11-01 MED ORDER — ACETAMINOPHEN 325 MG PO TABS
650.0000 mg | ORAL_TABLET | Freq: Four times a day (QID) | ORAL | Status: DC | PRN
  Administered 2011-11-01 – 2011-11-03 (×4): 650 mg via ORAL
  Filled 2011-11-01 (×4): qty 2

## 2011-11-01 MED ORDER — NAPROXEN SODIUM 220 MG PO CAPS: 1.0000 | ORAL_CAPSULE | Freq: Two times a day (BID) | ORAL | Status: DC

## 2011-11-01 MED ORDER — BIOTENE DRY MOUTH MT LIQD
15.0000 mL | Freq: Two times a day (BID) | OROMUCOSAL | Status: DC
  Administered 2011-11-01 (×2): 15 mL via OROMUCOSAL

## 2011-11-01 MED ORDER — ZOLPIDEM TARTRATE 5 MG PO TABS: 5.0000 mg | ORAL_TABLET | Freq: Every evening | ORAL | Status: DC | PRN

## 2011-11-01 MED ORDER — CARVEDILOL 3.125 MG PO TABS
3.1250 mg | ORAL_TABLET | Freq: Two times a day (BID) | ORAL | Status: DC
  Administered 2011-11-01 – 2011-11-03 (×6): 3.125 mg via ORAL
  Filled 2011-11-01 (×7): qty 1

## 2011-11-01 MED ORDER — VITAMIN C 500 MG PO TABS
500.0000 mg | ORAL_TABLET | Freq: Every day | ORAL | Status: DC
  Administered 2011-11-01 – 2011-11-03 (×3): 500 mg via ORAL
  Filled 2011-11-01 (×3): qty 1

## 2011-11-01 MED ORDER — CLOTRIMAZOLE 1 % EX CREA
TOPICAL_CREAM | Freq: Two times a day (BID) | CUTANEOUS | Status: DC
  Administered 2011-11-01: 11:00:00 via TOPICAL
  Filled 2011-11-01: qty 15

## 2011-11-01 MED ORDER — CIPROFLOXACIN IN D5W 400 MG/200ML IV SOLN
400.0000 mg | Freq: Two times a day (BID) | INTRAVENOUS | Status: DC
  Administered 2011-11-01 – 2011-11-03 (×4): 400 mg via INTRAVENOUS
  Filled 2011-11-01 (×5): qty 200

## 2011-11-01 MED ORDER — ADULT MULTIVITAMIN W/MINERALS CH
1.0000 | ORAL_TABLET | Freq: Every day | ORAL | Status: DC
  Administered 2011-11-01 – 2011-11-03 (×3): 1 via ORAL
  Filled 2011-11-01 (×3): qty 1

## 2011-11-01 MED ORDER — CALTRATE 600+D PLUS 600-400 MG-UNIT PO CHEW: 1.0000 | CHEWABLE_TABLET | Freq: Two times a day (BID) | ORAL | Status: DC

## 2011-11-01 MED ORDER — ASPIRIN EC 81 MG PO TBEC
81.0000 mg | DELAYED_RELEASE_TABLET | Freq: Every day | ORAL | Status: DC
  Administered 2011-11-01 – 2011-11-03 (×3): 81 mg via ORAL
  Filled 2011-11-01 (×3): qty 1

## 2011-11-01 MED ORDER — CITALOPRAM HYDROBROMIDE 20 MG PO TABS
20.0000 mg | ORAL_TABLET | Freq: Every day | ORAL | Status: DC
  Administered 2011-11-01 – 2011-11-03 (×3): 20 mg via ORAL
  Filled 2011-11-01 (×3): qty 1

## 2011-11-01 MED ORDER — MOMETASONE FURO-FORMOTEROL FUM 100-5 MCG/ACT IN AERO
2.0000 | INHALATION_SPRAY | Freq: Two times a day (BID) | RESPIRATORY_TRACT | Status: DC
  Administered 2011-11-01 – 2011-11-03 (×6): 2 via RESPIRATORY_TRACT
  Filled 2011-11-01 (×3): qty 8.8

## 2011-11-01 MED ORDER — HEPARIN SODIUM (PORCINE) 5000 UNIT/ML IJ SOLN
5000.0000 [IU] | Freq: Three times a day (TID) | INTRAMUSCULAR | Status: DC
  Administered 2011-11-01 – 2011-11-03 (×8): 5000 [IU] via SUBCUTANEOUS
  Filled 2011-11-01 (×10): qty 1

## 2011-11-01 MED ORDER — VITAMIN D3 25 MCG (1000 UNIT) PO TABS
2000.0000 [IU] | ORAL_TABLET | Freq: Every day | ORAL | Status: DC
  Administered 2011-11-01 – 2011-11-03 (×3): 2000 [IU] via ORAL
  Filled 2011-11-01 (×3): qty 2

## 2011-11-01 MED ORDER — METRONIDAZOLE IN NACL 5-0.79 MG/ML-% IV SOLN
500.0000 mg | Freq: Three times a day (TID) | INTRAVENOUS | Status: DC
  Administered 2011-11-01 – 2011-11-03 (×7): 500 mg via INTRAVENOUS
  Filled 2011-11-01 (×9): qty 100

## 2011-11-01 MED ORDER — LISINOPRIL 5 MG PO TABS
5.0000 mg | ORAL_TABLET | Freq: Every day | ORAL | Status: DC
  Administered 2011-11-01 – 2011-11-03 (×3): 5 mg via ORAL
  Filled 2011-11-01 (×3): qty 1

## 2011-11-01 MED ORDER — FERROUS SULFATE 325 (65 FE) MG PO TABS
325.0000 mg | ORAL_TABLET | Freq: Two times a day (BID) | ORAL | Status: DC
  Administered 2011-11-01 – 2011-11-03 (×6): 325 mg via ORAL
  Filled 2011-11-01 (×7): qty 1

## 2011-11-01 MED ORDER — MORPHINE SULFATE 2 MG/ML IJ SOLN
2.0000 mg | INTRAMUSCULAR | Status: DC | PRN
  Administered 2011-11-02: 2 mg via INTRAVENOUS
  Filled 2011-11-01: qty 1

## 2011-11-01 MED ORDER — ASPIRIN 81 MG PO TBEC: 81.0000 mg | DELAYED_RELEASE_TABLET | Freq: Every day | ORAL | Status: DC

## 2011-11-01 MED ORDER — OXYCODONE HCL ER 10 MG PO T12A
10.0000 mg | EXTENDED_RELEASE_TABLET | Freq: Two times a day (BID) | ORAL | Status: DC
  Administered 2011-11-01 – 2011-11-03 (×4): 10 mg via ORAL
  Filled 2011-11-01 (×4): qty 1

## 2011-11-01 NOTE — Progress Notes (Signed)
Patient admitted from [place] at [place] ([facility]) and will anticipate return at d/c. Patient admitted as an OBS- await PT eval for disposition/level of care determination.  Full assessment to follow- Reece Levy, MSW, LCSWA [phone]

## 2011-11-01 NOTE — Progress Notes (Signed)
Addendum  Patient seen and examined, chart and data base reviewed.  I agree with the above assessment and plan.  For full details please see Mrs. Algis Downs PA. Note.  Sigmoid colitis, patient with Cipro and Flagyl.  Pancreatic hypodense mass, can be cystic neoplasm, followup with [facility].   Clint Lipps, MD [facility] Pager: [phone] 11/01/2011, 5:16 PM

## 2011-11-01 NOTE — Care Management Note (Signed)
    Page 1 of 2   11/03/2011     2:30:13 PM   CARE MANAGEMENT NOTE 11/03/2011  Patient:  Sessums,Jalyiah   Account Number:  [id number]  Date Initiated:  11/01/2011  Documentation initiated by:  Letha Cape  Subjective/Objective Assessment:   dx diverticulitis  admit as observation- from [place] at [facility].     Action/Plan:   pt eval- rec hhpt   Anticipated DC Date:  11/03/2011   Anticipated DC Plan:  HOME W HOME HEALTH SERVICES  In-house referral  Clinical Social Worker      DC Associate Professor  CM consult      [facility] Choice  HOME HEALTH   Choice offered to / List presented to:  C-1 Patient        HH arranged  HH-2 PT      HH agency  OTHER - SEE NOTE   Status of service:  Completed, signed off Medicare Important Message given?   (If response is "NO", the following Medicare IM given date fields will be blank) Date Medicare IM given:   Date Additional Medicare IM given:    Discharge Disposition:  HOME W HOME HEALTH SERVICES  Per UR Regulation:  Reviewed for med. necessity/level of care/duration of stay  If discussed at Long Length of Stay Meetings, dates discussed:    Comments:  11/03/11 14:28 Letha Cape RN, BSN [phone] patient for dc today, will have hhpt with [facility].  Orders faxed over to [facility] with face to face and demographics.  Soc will begin 24-48 hrs post discharge.  11/02/11 16:24 Letha Cape RN, BSN [phone] patient states she has had hhpt before at [facility] with someone name allison and she would like to continue with the same agency, I called [place] and spoke with [place] she states the patient worked with Chief Technology Officer for [employer] and they would need an order from [place] for hhpt their fax number is [phone] and phone is [phone].  11/01/11 16:18 Letha Cape RN, BSN  [phone] patient is from [place] at [facility].  Await pt eval.  NCM will continue to follow for dc needs.

## 2011-11-01 NOTE — H&P (Signed)
[facility] History and Physical  Katie Galvan:[id number] DOB: July 01, 1927 DOA: 10/31/2011  Referring physician: ED PCP: Michele Mcalpine, MD  Specialists: None  Chief Complaint: Abdominal pain  HPI: Katie Galvan is a 78 y.o. female who presents with abdominal pain, onset yesterday, located LLQ, no N/V/D, no blood in stools, no f/c.  Nothing made better nor worse.  CT scan in ED revealed the source of her problem to be very apparent diverticulitis, she also has a WBC of 18.  Given the patients age hospitalist service has been asked to admit for obs initiation on IV antibiotics for diverticulitis.  Review of Systems: 12 systems reviewed and otherwise negative.  Past Medical History  Diagnosis Date  . Disorders of diaphragm   . Unspecified essential hypertension   . Endocarditis, valve unspecified, unspecified cause   . Cardiac dysrhythmia, unspecified   . Unspecified venous (peripheral) insufficiency   . Edema   . Pure hypercholesterolemia   . Other dysphagia   . Diverticulosis of colon (without mention of hemorrhage)   . Benign neoplasm of colon   . Osteoarthrosis, unspecified whether generalized or localized, unspecified site   . Lumbago   . Chronic pain syndrome   . Disorder of bone and cartilage, unspecified   . Depressive disorder, not elsewhere classified   . Anemia, unspecified   . Skin cancer   . Systolic heart failure     echo 10/10/10 EF 30%   Past Surgical History  Procedure Date  . Appendectomy   . Breast surgery   . Right wrist surgery   . Bilateral cataracts   . Hip surgery   . Tonsillectomy   . Adenoidectomy    Social History:  reports that she has never smoked. She has never used smokeless tobacco. She reports that she does not drink alcohol or use illicit drugs.   Allergies  Allergen Reactions  . Sulfonamide Derivatives Swelling    Family History  Problem Relation Age of Onset  . Diabetes Mother   . Heart disease Father   . Lung  cancer Sister      Prior to Admission medications   Medication Sig Start Date End Date Taking? Authorizing Provider  aspirin 81 MG EC tablet Take 81 mg by mouth daily.     Yes Historical Provider, MD  Calcium Carbonate-Vit D-Min (CALTRATE 600+D PLUS) 600-400 MG-UNIT per tablet Chew 1 tablet by mouth 2 (two) times daily.    Yes Historical Provider, MD  carvedilol (COREG) 6.25 MG tablet Take 0.5 tablets (3.125 mg total) by mouth 2 (two) times daily with a meal. 10/09/11  Yes Michele Mcalpine, MD  cholecalciferol (VITAMIN D) 1000 UNITS tablet Take 2,000 Units by mouth daily.   Yes Historical Provider, MD  citalopram (CELEXA) 20 MG tablet Take 1 tablet (20 mg total) by mouth daily. 10/09/11  Yes Michele Mcalpine, MD  clotrimazole-betamethasone (LOTRISONE) cream Apply 1 application topically 2 (two) times daily as needed. For rashes on forehead.   Yes Historical Provider, MD  ferrous sulfate 325 (65 FE) MG tablet Take 325 mg by mouth 2 (two) times daily.     Yes Historical Provider, MD  Fluticasone-Salmeterol (ADVAIR DISKUS) 100-50 MCG/DOSE AEPB Inhale 1 puff into the lungs every 12 (twelve) hours. 10/09/11  Yes Michele Mcalpine, MD  furosemide (LASIX) 40 MG tablet Take 40 mg by mouth daily as needed. For edema 10/09/11  Yes Michele Mcalpine, MD  lisinopril (PRINIVIL,ZESTRIL) 5 MG tablet Take 5 mg by mouth daily.  Yes Historical Provider, MD  loratadine (CLARITIN) 10 MG tablet Take 10 mg by mouth daily.     Yes Historical Provider, MD  metroNIDAZOLE (FLAGYL) 500 MG tablet Take 500 mg by mouth 3 (three) times daily. For 10 days; Start date 10/31/11 10/30/11  Yes Michele Mcalpine, MD  morphine (MS CONTIN) 15 MG 12 hr tablet Take 1 tablet (15 mg total) by mouth 2 (two) times daily. 10/09/11  Yes Michele Mcalpine, MD  Multiple Vitamin (MULTIVITAMIN WITH MINERALS) TABS Take 1 tablet by mouth daily.   Yes Historical Provider, MD  Naproxen Sodium (ALEVE) 220 MG CAPS Take 1 capsule by mouth 2 (two) times daily.   Yes Historical  Provider, MD  tiotropium (SPIRIVA) 18 MCG inhalation capsule Place 1 capsule (18 mcg total) into inhaler and inhale daily. 10/09/11  Yes Michele Mcalpine, MD  vitamin C (ASCORBIC ACID) 500 MG tablet Take 500 mg by mouth daily.     Yes Historical Provider, MD  zolpidem (AMBIEN) 10 MG tablet Take 5 mg by mouth at bedtime as needed. For insomnia 10/09/11 02/23/12 Yes Michele Mcalpine, MD   Physical Exam: Filed Vitals:   10/31/11 2100 10/31/11 2243 10/31/11 2300 11/01/11 0008  BP: 180/82 168/82 163/68 174/77  Pulse: 73 73 71 66  Temp:    98.6 F (37 C)  TempSrc:    Oral  Resp:    20  Weight:    67.2 kg (148 lb 2.4 oz)  SpO2: 100% 99% 100% 100%    General:  NAD, resting comfortably in bed Eyes: PEERLA EOMI ENT: mucous membranes moist Neck: supple w/o JVD Cardiovascular: RRR w/o MRG Respiratory: CTA B Abdomen: soft, tenderness in the lower abdomen in LLQ and RLQ, nd, bs+ Skin: no rash nor lesion Musculoskeletal: MAE, full ROM all 4 extremities Psychiatric: normal tone and affect Neurologic: AAOx3, grossly non-focal   Labs on Admission:  Basic Metabolic Panel:  Lab 10/31/11 9629  NA 140  K 3.9  CL 102  CO2 28  GLUCOSE 121*  BUN 15  CREATININE 0.78  CALCIUM 9.6  MG --  PHOS --   Liver Function Tests:  Lab 10/31/11 1742  AST 14  ALT 10  ALKPHOS 107  BILITOT 0.4  PROT 7.0  ALBUMIN 2.9*    Lab 10/31/11 1830  LIPASE 7*  AMYLASE --   No results found for this basename: AMMONIA:5 in the last 168 hours CBC:  Lab 10/31/11 1742  WBC 18.7*  NEUTROABS 15.1*  HGB 10.3*  HCT 32.5*  MCV 86.2  PLT 574*   Cardiac Enzymes: No results found for this basename: CKTOTAL:5,CKMB:5,CKMBINDEX:5,TROPONINI:5 in the last 168 hours  BNP (last 3 results)  Basename 08/02/11 1630 05/05/11 1553 12/07/10 1657  PROBNP 1420.0* 368.0* 119.0*   CBG: No results found for this basename: GLUCAP:5 in the last 168 hours  Radiological Exams on Admission: Ct Abdomen Pelvis W  Contrast  10/31/2011  *RADIOLOGY REPORT*  Clinical Data: Right lower quadrant pain.  Lower abdominal pain and nausea.  CT ABDOMEN AND PELVIS WITH CONTRAST  Technique:  Multidetector CT imaging of the abdomen and pelvis was performed following the standard protocol during bolus administration of intravenous contrast.  Contrast: OMNIPAQUE IOHEXOL 300 MG/ML  SOLN  Comparison: CT abdomen and pelvis with contrast 10/09/2011.  Findings: Mild dependent atelectasis is present at the right lung base.  Elevation of the right hemidiaphragm is stable.  Heart size is normal.  No significant pleural or pericardial effusion is present.  The liver and spleen are within normal limits.  A small hiatal hernia is stable.  The stomach and duodenum are within normal limits.  A hypodense pancreatic mass at the uncinate process measures at least 3.7 x 1.4 cm, similar to the prior exam.  There is mild pancreatic duct dilation.  The adrenal glands are normal bilaterally.  Renal cysts are stable.  Atherosclerotic calcifications are noted in the aorta.  Fluid levels are present in the colon.  Free fluid is similar to the prior exam.  Extensive wall thickening and inflammatory changes present through the sigmoid colon.  Diverticular changes are evident in the descending colon with additional more mild inflammatory changes.  The more proximal transverse colon is unremarkable.  Small bowel is within normal limits.  Asymmetric wall thickening is noted along the urinary bladder, new from prior last exam.  The patient is status post right total hip arthroplasty. Degenerative changes are again noted in the lower lumbar spine.  IMPRESSION:  1.  Stable hypodense mass at the onset process of the pancreas may represent a cystic neoplasm.  This recommend non emergent ultrasound for further evaluation. 2.  Stable atrophic and cystic changes of the kidneys. 3.  Nonobstructing 5 mm stone at the lower pole of the left kidney. 4.  Changes compatible  with sigmoid colitis, likely secondary to diverticular disease. 5.  Free fluid in the anatomic pelvis. 6.  Atherosclerosis.   Original Report Authenticated By: Jamesetta Orleans. MATTERN, M.D.     EKG: Independently reviewed.  Assessment/Plan Principal Problem:  *Diverticulitis of sigmoid colon Active Problems:  DIVERTICULOSIS OF COLON   1. Diverticulitis of sigmoid colon - with associated leukocytosis, starting cipro/flagyl on patient, only 1/4 SIRS criteria, NPO except meds for now, fluids at 75 cc/hr for now, likely home when diverticulitis starts to calm down somewhat. 2. Leukocytosis - secondary to #1 no other SIRS criteria 3. Cystic neoplasm of pancreas - stable, ultrasound non-emergently.  Code Status: Full Code (must indicate code status--if unknown or must be presumed, indicate so) Family Communication: No family at bedside (indicate person spoken with, if applicable, with phone number if by telephone) Disposition Plan: Admit to Obs (indicate anticipated LOS)  Time spent: 30 min  Jasper Hanf M. [facility] Pager [phone]  If 7PM-7AM, please contact night-coverage [email or web address] Password [email or web address] 11/01/2011, 1:44 AM

## 2011-11-01 NOTE — Progress Notes (Signed)
[facility] PROGRESS NOTE  Katie Galvan ZOX:[id number] DOB: [birth year] DOA: 10/31/2011 PCP: Michele Mcalpine, MD  Assessment/Plan:  Sigmoid colitis Abdominal pain and diarrhea CT abd/pelvis:  Extensive wall thickening and inflammatory changes present through the sigmoid colon. Cipro / Flagyl IV started on admission Advanced to clear liquids. She has been referred to [facility].  They are reviewing her records and will see her either inpatient or in follow up.  Pancreatic Mass.   Lipase 7 CT:  Stable hypodense mass at the onset process of the pancreas may represent a cystic neoplasm. Mild pancreatic duct dilation.  Will refer to  GI for management.  Normocytic Anemia Patient has seen Heme/Onc in the past for IV iron. 5/13 Hgb was 12.   It appears baseline is approx 10 - 11. Monitor and transfuse if necessary Will check for FOB Will start Nu-iron Follow up with Hematologist at discharge.  UTI Culture Pending On cipro.  Diastolic CHF with valvular heart disease Stable.  Saline Lock IVF. 08/2011 Echo EF 65 - 70% with heavily calcified mitral valve. Check am BNP Carvedilol Aspirin 81 Lasix PRN  Falling Has had multiple falls - doesn't necessarily remember them per her son. Physical Therapy consulted Patient is on chronic narcotic therapy - which has been significantly reduced per her son.  Reduce from MS contin 15 bid -> to Oxycontin 10 bid.  Per son she was seen in Dr. Prescott Gum office and they plan to put a holter monitor on her.  I will check with his office to see if they would like move forward with the monitor or if the same goal could be accomplished by monitoring her on tele.  Chronic Respiratory Failure Stable. On O2 2 liters 24/7   Code Status: Full Code Family Communication: Spoke with son [name] 10/30. Disposition Plan: Inpatient  (currently resides in independent living at [facility])   Consultants:  [facility], cardiology    Antibiotics:  Cipro / Flagyl  HPI/Subjective: Feeling better.  Would like something to drink.    Objective: Filed Vitals:   11/01/11 0008 11/01/11 0100 11/01/11 0534 11/01/11 0944  BP: 174/77  156/72   Pulse: 66  66   Temp: 98.6 F (37 C)  97.8 F (36.6 C)   TempSrc: Oral  Oral   Resp: 20  18   Height:  5' 4.17" (1.63 m)    Weight: 67.2 kg (148 lb 2.4 oz)     SpO2: 100%  100% 97%    Intake/Output Summary (Last 24 hours) at 11/01/11 1052 Last data filed at 11/01/11 0900  Gross per 24 hour  Intake    835 ml  Output    100 ml  Net    735 ml   Filed Weights   11/01/11 0008  Weight: 67.2 kg (148 lb 2.4 oz)    Exam:   General:  A&O appears comfortable  Cardiovascular: RRR, no obvious M/R/G  Respiratory: CTA no W/C/R  Abdomen: Soft, NT, ND, +BS, No mass  Skin:  Dry.  LLE has a hematoma on the lateral side.  Annular (approximately 2" in diameter).  Healing lesion on RLE in approximately the same spot.  Data Reviewed: Basic Metabolic Panel:  Lab 11/01/11 8119 11/01/11 0153 10/31/11 1742  NA 135 -- 140  K 3.6 -- 3.9  CL 101 -- 102  CO2 25 -- 28  GLUCOSE 79 -- 121*  BUN 12 -- 15  CREATININE 0.73 0.73 0.78  CALCIUM 8.7 -- 9.6  MG -- -- --  PHOS -- -- --   Liver Function Tests:  Lab 10/31/11 1742  AST 14  ALT 10  ALKPHOS 107  BILITOT 0.4  PROT 7.0  ALBUMIN 2.9*    Lab 10/31/11 1830  LIPASE 7*  AMYLASE --   CBC:  Lab 11/01/11 0610 11/01/11 0153 10/31/11 1742  WBC 16.5* 16.4* 18.7*  NEUTROABS -- -- 15.1*  HGB 8.7* 8.7* 10.3*  HCT 28.2* 27.9* 32.5*  MCV 86.5 86.4 86.2  PLT 505* 504* 574*   BNP (last 3 results)  Basename 08/02/11 1630 05/05/11 1553 12/07/10 1657  PROBNP 1420.0* 368.0* 119.0*    Studies: Ct Abdomen Pelvis W Contrast  10/31/2011  *RADIOLOGY REPORT*  Clinical Data: Right lower quadrant pain.  Lower abdominal pain and nausea.  CT ABDOMEN AND PELVIS WITH CONTRAST  Technique:  Multidetector CT imaging of the abdomen and  pelvis was performed following the standard protocol during bolus administration of intravenous contrast.  Contrast: OMNIPAQUE IOHEXOL 300 MG/ML  SOLN  Comparison: CT abdomen and pelvis with contrast 10/09/2011.  Findings: Mild dependent atelectasis is present at the right lung base.  Elevation of the right hemidiaphragm is stable.  Heart size is normal.  No significant pleural or pericardial effusion is present.  The liver and spleen are within normal limits.  A small hiatal hernia is stable.  The stomach and duodenum are within normal limits.  A hypodense pancreatic mass at the uncinate process measures at least 3.7 x 1.4 cm, similar to the prior exam.  There is mild pancreatic duct dilation.  The adrenal glands are normal bilaterally.  Renal cysts are stable.  Atherosclerotic calcifications are noted in the aorta.  Fluid levels are present in the colon.  Free fluid is similar to the prior exam.  Extensive wall thickening and inflammatory changes present through the sigmoid colon.  Diverticular changes are evident in the descending colon with additional more mild inflammatory changes.  The more proximal transverse colon is unremarkable.  Small bowel is within normal limits.  Asymmetric wall thickening is noted along the urinary bladder, new from prior last exam.  The patient is status post right total hip arthroplasty. Degenerative changes are again noted in the lower lumbar spine.  IMPRESSION:  1.  Stable hypodense mass at the onset process of the pancreas may represent a cystic neoplasm.  This recommend non emergent ultrasound for further evaluation. 2.  Stable atrophic and cystic changes of the kidneys. 3.  Nonobstructing 5 mm stone at the lower pole of the left kidney. 4.  Changes compatible with sigmoid colitis, likely secondary to diverticular disease. 5.  Free fluid in the anatomic pelvis. 6.  Atherosclerosis.   Original Report Authenticated By: Jamesetta Orleans. MATTERN, M.D.     Scheduled Meds:     . sodium chloride   Intravenous STAT  . antiseptic oral rinse  15 mL Mouth Rinse q12n4p  . aspirin EC  81 mg Oral Daily  . calcium-vitamin D  1 tablet Oral BID  . carvedilol  3.125 mg Oral BID WC  . chlorhexidine  15 mL Mouth Rinse BID  . cholecalciferol  2,000 Units Oral Daily  . ciprofloxacin  400 mg Intravenous Once  . ciprofloxacin  400 mg Intravenous Q12H  . citalopram  20 mg Oral Daily  . clotrimazole   Topical BID  . ferrous sulfate  325 mg Oral BID WC  . heparin  5,000 Units Subcutaneous Q8H  . iohexol  20 mL Oral Q1 Hr x 2  .  lisinopril  5 mg Oral Daily  . loratadine  10 mg Oral Daily  . metronidazole  500 mg Intravenous Once  . metronidazole  500 mg Intravenous Q8H  . mometasone-formoterol  2 puff Inhalation BID  . morphine  15 mg Oral BID  .  morphine injection  2 mg Intravenous Once  .  morphine injection  2 mg Intravenous Once  . multivitamin with minerals  1 tablet Oral Daily  . naproxen  250 mg Oral BID WC  . ondansetron  4 mg Intravenous Once  . pneumococcal 23 valent vaccine  0.5 mL Intramuscular Tomorrow-1000  . sodium chloride  500 mL Intravenous Once  . sodium chloride  3 mL Intravenous Q12H  . tiotropium  18 mcg Inhalation Daily  . vitamin C  500 mg Oral Daily  . DISCONTD: aspirin  81 mg Oral Daily  . DISCONTD: CALTRATE 600+D PLUS  1 tablet Oral BID  . DISCONTD: Naproxen Sodium  1 capsule Oral BID   Continuous Infusions:   Principal Problem:  *Diverticulitis of sigmoid colon Active Problems:  Pancreatic mass  Anemia  Chronic pain syndrome  HYPERTENSION  VALVULAR HEART DISEASE  DIVERTICULOSIS OF COLON  DEGENERATIVE JOINT DISEASE  Chronic respiratory failure with hypoxia  Diastolic CHF, chronic    Time spent: 30 min    Stephani Police  [facility] Pager [phone]. If 8PM-8AM, please contact night-coverage at [email or web address], password [facility] 11/01/2011, 10:52 AM  LOS: 1 day

## 2011-11-02 ENCOUNTER — Ambulatory Visit: Payer: Medicare Other | Admitting: Physician Assistant

## 2011-11-02 DIAGNOSIS — I5033 Acute on chronic diastolic (congestive) heart failure: Secondary | ICD-10-CM

## 2011-11-02 DIAGNOSIS — R0902 Hypoxemia: Secondary | ICD-10-CM

## 2011-11-02 DIAGNOSIS — J9610 Chronic respiratory failure, unspecified whether with hypoxia or hypercapnia: Secondary | ICD-10-CM

## 2011-11-02 LAB — CBC
Hemoglobin: 9.2 g/dL — ABNORMAL LOW (ref 12.0–15.0)
MCHC: 30.4 g/dL (ref 30.0–36.0)
Platelets: 509 10*3/uL — ABNORMAL HIGH (ref 150–400)
RBC: 3.48 MIL/uL — ABNORMAL LOW (ref 3.87–5.11)

## 2011-11-02 LAB — URINE CULTURE: Culture: NO GROWTH

## 2011-11-02 LAB — PRO B NATRIURETIC PEPTIDE: Pro B Natriuretic peptide (BNP): 4539 pg/mL — ABNORMAL HIGH (ref 0–450)

## 2011-11-02 MED ORDER — FUROSEMIDE 20 MG PO TABS
20.0000 mg | ORAL_TABLET | Freq: Every day | ORAL | Status: DC
  Filled 2011-11-02: qty 1

## 2011-11-02 MED ORDER — TRAZODONE HCL 50 MG PO TABS: 50.0000 mg | ORAL_TABLET | Freq: Every evening | ORAL | Status: DC | PRN

## 2011-11-02 MED ORDER — POTASSIUM CHLORIDE CRYS ER 20 MEQ PO TBCR
40.0000 meq | EXTENDED_RELEASE_TABLET | Freq: Once | ORAL | Status: AC
  Administered 2011-11-02: 40 meq via ORAL
  Filled 2011-11-02: qty 2

## 2011-11-02 MED ORDER — FUROSEMIDE 10 MG/ML IJ SOLN
40.0000 mg | Freq: Once | INTRAMUSCULAR | Status: AC
  Administered 2011-11-02: 40 mg via INTRAVENOUS
  Filled 2011-11-02: qty 4

## 2011-11-02 MED ORDER — FUROSEMIDE 40 MG PO TABS
40.0000 mg | ORAL_TABLET | Freq: Every day | ORAL | Status: DC
  Administered 2011-11-03: 40 mg via ORAL
  Filled 2011-11-02: qty 1

## 2011-11-02 NOTE — Progress Notes (Signed)
[facility] PROGRESS NOTE  Katie Galvan MVH:[id number] DOB: April 18, 1927 DOA: 10/31/2011 PCP: Michele Mcalpine, MD  Assessment/Plan:  Sigmoid colitis No bowel movement since 10/29.  Abdominal pain improved. CT abd/pelvis:  Extensive wall thickening and inflammatory changes present through the sigmoid colon. Cipro / Flagyl IV started on admission Advanced from clears to full liquids today. She has been referred to [facility].  They have arranged an outpatient follow up appointment. 10/31 wbc rose to 18 today?  Will monitor.  Pancreatic Mass.   Lipase 7, asymptomatic CT:  Stable hypodense mass at the onset process of the pancreas may represent a cystic neoplasm. Mild pancreatic duct dilation.  Will refer to [facility] for outpatient management.  Normocytic Anemia Patient has seen Heme/Onc in the past for IV iron. 5/13 Hgb was 12.   It appears baseline is approx 10 - 11. Monitor and transfuse if necessary Will check for FOB (unfortunately no stools) Will start Nu-iron Follow up with Hematologist at discharge.  UTI Culture Pending On cipro.  Diastolic CHF with valvular heart disease Stable.  Appears asymptomatic.  Saline Lock IVF. 08/2011 Echo EF 65 - 70% with heavily calcified mitral valve. BNP elevated 4000+  Carvedilol Aspirin 81 Lasix (Change from PRN to scheduled as of 11/02/11)) Having episodes of afib on tele (ex. At 1305 on 10/31).  Will ask [place] cards to review overall cardiac picture: HF & arrthymia.  Per Patient's son, Dr. Gala Romney was planning a holter monitor as the patient has been falling frequently (she can't remember falling)  Falling Has had multiple falls - doesn't necessarily remember them per her son. Physical Therapy consulted Patient is on chronic narcotic therapy. Reduced from MS contin 15 bid -> to Oxycontin 10 bid. Patient tolerating it well.  Chronic Respiratory Failure Stable. On O2 2 liters 24/7   Code Status: Full Code Family  Communication: Spoke with son [name] 10/30. Disposition Plan: Inpatient  (currently resides in independent living at [facility])   Consultants:  [facility]   Antibiotics:  Cipro / Flagyl  HPI/Subjective: Feeling better.  Would like something to drink.    Objective: Filed Vitals:   11/01/11 2050 11/01/11 2056 11/02/11 0420 11/02/11 0908  BP: 147/71  145/56 111/56  Pulse: 62  60 75  Temp: 98.1 F (36.7 C)  98.1 F (36.7 C) 98.1 F (36.7 C)  TempSrc: Oral  Oral Oral  Resp: 18  16 18   Height:      Weight:      SpO2: 97% 97% 98% 99%    Intake/Output Summary (Last 24 hours) at 11/02/11 1356 Last data filed at 11/02/11 0900  Gross per 24 hour  Intake    950 ml  Output    850 ml  Net    100 ml   Filed Weights   11/01/11 0008  Weight: 67.2 kg (148 lb 2.4 oz)    Exam:   General:  A&O appears comfortable.  Sitting in chair.  Cardiovascular: RRR, no obvious M/R/G  Respiratory: CTA no W/C/R  Abdomen: Soft, NT, ND, +BS, No mass  Skin:  Dry.  LLE has a hematoma on the lateral side.  Annular (approximately 2" in diameter).  Healing lesion on RLE in approximately the same spot.  Data Reviewed: Basic Metabolic Panel:  Lab 11/01/11 8413 11/01/11 0153 10/31/11 1742  NA 135 -- 140  K 3.6 -- 3.9  CL 101 -- 102  CO2 25 -- 28  GLUCOSE 79 -- 121*  BUN 12 -- 15  CREATININE  0.73 0.73 0.78  CALCIUM 8.7 -- 9.6  MG -- -- --  PHOS -- -- --   Liver Function Tests:  Lab 10/31/11 1742  AST 14  ALT 10  ALKPHOS 107  BILITOT 0.4  PROT 7.0  ALBUMIN 2.9*    Lab 10/31/11 1830  LIPASE 7*  AMYLASE --   CBC:  Lab 11/02/11 0623 11/01/11 0610 11/01/11 0153 10/31/11 1742  WBC 18.7* 16.5* 16.4* 18.7*  NEUTROABS -- -- -- 15.1*  HGB 9.2* 8.7* 8.7* 10.3*  HCT 30.3* 28.2* 27.9* 32.5*  MCV 87.1 86.5 86.4 86.2  PLT 509* 505* 504* 574*   BNP (last 3 results)  Basename 11/02/11 0623 08/02/11 1630 05/05/11 1553  PROBNP 4539.0* 1420.0* 368.0*    Studies: Ct Abdomen  Pelvis W Contrast  10/31/2011  *RADIOLOGY REPORT*  Clinical Data: Right lower quadrant pain.  Lower abdominal pain and nausea.  CT ABDOMEN AND PELVIS WITH CONTRAST  Technique:  Multidetector CT imaging of the abdomen and pelvis was performed following the standard protocol during bolus administration of intravenous contrast.  Contrast: OMNIPAQUE IOHEXOL 300 MG/ML  SOLN  Comparison: CT abdomen and pelvis with contrast 10/09/2011.  Findings: Mild dependent atelectasis is present at the right lung base.  Elevation of the right hemidiaphragm is stable.  Heart size is normal.  No significant pleural or pericardial effusion is present.  The liver and spleen are within normal limits.  A small hiatal hernia is stable.  The stomach and duodenum are within normal limits.  A hypodense pancreatic mass at the uncinate process measures at least 3.7 x 1.4 cm, similar to the prior exam.  There is mild pancreatic duct dilation.  The adrenal glands are normal bilaterally.  Renal cysts are stable.  Atherosclerotic calcifications are noted in the aorta.  Fluid levels are present in the colon.  Free fluid is similar to the prior exam.  Extensive wall thickening and inflammatory changes present through the sigmoid colon.  Diverticular changes are evident in the descending colon with additional more mild inflammatory changes.  The more proximal transverse colon is unremarkable.  Small bowel is within normal limits.  Asymmetric wall thickening is noted along the urinary bladder, new from prior last exam.  The patient is status post right total hip arthroplasty. Degenerative changes are again noted in the lower lumbar spine.  IMPRESSION:  1.  Stable hypodense mass at the onset process of the pancreas may represent a cystic neoplasm.  This recommend non emergent ultrasound for further evaluation. 2.  Stable atrophic and cystic changes of the kidneys. 3.  Nonobstructing 5 mm stone at the lower pole of the left kidney. 4.  Changes  compatible with sigmoid colitis, likely secondary to diverticular disease. 5.  Free fluid in the anatomic pelvis. 6.  Atherosclerosis.   Original Report Authenticated By: Jamesetta Orleans. MATTERN, M.D.     Scheduled Meds:    . antiseptic oral rinse  15 mL Mouth Rinse q12n4p  . aspirin EC  81 mg Oral Daily  . calcium-vitamin D  1 tablet Oral BID  . carvedilol  3.125 mg Oral BID WC  . chlorhexidine  15 mL Mouth Rinse BID  . cholecalciferol  2,000 Units Oral Daily  . ciprofloxacin  400 mg Intravenous Q12H  . citalopram  20 mg Oral Daily  . clotrimazole   Topical BID  . ferrous sulfate  325 mg Oral BID WC  . heparin  5,000 Units Subcutaneous Q8H  . lisinopril  5 mg Oral  Daily  . loratadine  10 mg Oral Daily  . metronidazole  500 mg Intravenous Q8H  . mometasone-formoterol  2 puff Inhalation BID  . multivitamin with minerals  1 tablet Oral Daily  . naproxen  250 mg Oral BID WC  . OxyCODONE  10 mg Oral Q12H  . pneumococcal 23 valent vaccine  0.5 mL Intramuscular Tomorrow-1000  . sodium chloride  3 mL Intravenous Q12H  . tiotropium  18 mcg Inhalation Daily  . vitamin C  500 mg Oral Daily   Continuous Infusions:   Principal Problem:  *Diverticulitis of sigmoid colon Active Problems:  Pancreatic mass  Anemia  Chronic pain syndrome  HYPERTENSION  VALVULAR HEART DISEASE  DIVERTICULOSIS OF COLON  DEGENERATIVE JOINT DISEASE  Chronic respiratory failure with hypoxia  Diastolic CHF, chronic    Time spent: 30 min    Stephani Police  [facility] Pager [phone]. If 8PM-8AM, please contact night-coverage at [email or web address], password [facility] 11/02/2011, 1:56 PM  LOS: 2 days

## 2011-11-02 NOTE — Progress Notes (Signed)
PT recommending HH and patient will return to her Ind Living home at [place].  RNCM aware- CSW to sign off- Please reconsult if needs arise- Reece Levy, MSW, Theresia Majors [phone]

## 2011-11-02 NOTE — Plan of Care (Signed)
Problem: Phase I Progression Outcomes Goal: Initial discharge plan identified Outcome: Completed/Met Date Met:  11/02/11 To return to [place] independent living with H/H

## 2011-11-02 NOTE — Consult Note (Signed)
Advanced Heart Failure Team Consult Note  Referring Physician: [facility] Primary Physician: [name] Primary Cardiologist: Dr. Gala Romney  Reason for Consultation: HF and arrhythmias   HPI:    Katie Galvan is an 78 yo with history of mixed diastolic and systolic HF, LVEF 30% but EF has recovered to 65-70% per echo 08/03/11.  She also has restrictive lung disease, right hemidiaphragm elevation, COPD, GERD, history of endocarditis.  Last month admitted for syncope of unclear etiology. Seen in clinic and scheduled for holter monitor but has not picked up the device yet.  Volume status was stable at 143 pounds.  BP low in clinic so spironolactone was discontinued.    She was admitted with abdominal pain and CT scan showed changes compatible with sigmoid colitis felt secondary to diverticular disease.  WBC 18.  She was made NPO and started on 75 cc/hr for 12 hours and cipro/flagyl.  Her diet is currently being advanced as tolerated.  She is taking po meds.  She was restarted on her lasix 20 mg today.    HF team has been asked to see while in house as proBNP is 4539.  As noted earlier, lasix 20 mg po restarted today.  She denies dyspnea, orthopnea, or PND.  Mild lower extremity edema.  She is ready to start eating solid foods.  She denies dizziness or syncope.  Have reviewed her telemetry which shows NSR with PAC/PVCs.     Review of Systems: [y] = yes, [ ]  = no   General: Weight gain [ y]; Weight loss [ ] ; Anorexia [ ] ; Fatigue [ ] ; Fever [ ] ; Chills [ ] ; Weakness [ ]   Cardiac: Chest pain/pressure [ ] ; Resting SOB [ ] ; Exertional SOB [ ] ; Orthopnea [ ] ; Pedal Edema [ ] ; Palpitations [ ] ; Syncope [ ] ; Presyncope [ ] ; Paroxysmal nocturnal dyspnea[ ]   Pulmonary: Cough [ ] ; Wheezing[ ] ; Hemoptysis[ ] ; Sputum [ ] ; Snoring [ ]   GI: Vomiting[ ] ; Dysphagia[ ] ; Melena[ ] ; Hematochezia [ ] ; Heartburn[ ] ; Abdominal pain [place] ]; Constipation [ ] ; Diarrhea [ ] ; BRBPR [ ]   GU: Hematuria[ ] ; Dysuria [ ] ;  Nocturia[ ]   Vascular: Pain in legs with walking [ ] ; Pain in feet with lying flat [ ] ; Non-healing sores [ ] ; Stroke [ ] ; TIA [ ] ; Slurred speech [ ] ;  Neuro: Headaches[ ] ; Vertigo[ ] ; Seizures[ ] ; Paresthesias[ ] ;Blurred vision [ ] ; Diplopia [ ] ; Vision changes [ ]   Ortho/Skin: Arthritis [ ] ; Joint pain [ ] ; Muscle pain [ ] ; Joint swelling [ ] ; Back Pain [ ] ; Rash [ ]   Psych: Depression[ ] ; Anxiety[ ]   Heme: Bleeding problems [ ] ; Clotting disorders [ ] ; Anemia [ ]   Endocrine: Diabetes [ ] ; Thyroid dysfunction[ ]   Home Medications Prior to Admission medications   Medication Sig Start Date End Date Taking? Authorizing Provider  aspirin 81 MG EC tablet Take 81 mg by mouth daily.     Yes Historical Provider, MD  Calcium Carbonate-Vit D-Min (CALTRATE 600+D PLUS) 600-400 MG-UNIT per tablet Chew 1 tablet by mouth 2 (two) times daily.    Yes Historical Provider, MD  carvedilol (COREG) 6.25 MG tablet Take 0.5 tablets (3.125 mg total) by mouth 2 (two) times daily with a meal. 10/09/11  Yes Michele Mcalpine, MD  cholecalciferol (VITAMIN D) 1000 UNITS tablet Take 2,000 Units by mouth daily.   Yes Historical Provider, MD  citalopram (CELEXA) 20 MG tablet Take 1 tablet (20 mg total) by mouth daily. 10/09/11  Yes Michele Mcalpine, MD  clotrimazole-betamethasone (LOTRISONE) cream Apply 1 application topically 2 (two) times daily as needed. For rashes on forehead.   Yes Historical Provider, MD  ferrous sulfate 325 (65 FE) MG tablet Take 325 mg by mouth 2 (two) times daily.     Yes Historical Provider, MD  Fluticasone-Salmeterol (ADVAIR DISKUS) 100-50 MCG/DOSE AEPB Inhale 1 puff into the lungs every 12 (twelve) hours. 10/09/11  Yes Michele Mcalpine, MD  furosemide (LASIX) 40 MG tablet Take 40 mg by mouth daily as needed. For edema 10/09/11  Yes Michele Mcalpine, MD  lisinopril (PRINIVIL,ZESTRIL) 5 MG tablet Take 5 mg by mouth daily.   Yes Historical Provider, MD  loratadine (CLARITIN) 10 MG tablet Take 10 mg by mouth daily.      Yes Historical Provider, MD  metroNIDAZOLE (FLAGYL) 500 MG tablet Take 500 mg by mouth 3 (three) times daily. For 10 days; Start date 10/31/11 10/30/11  Yes Michele Mcalpine, MD  morphine (MS CONTIN) 15 MG 12 hr tablet Take 1 tablet (15 mg total) by mouth 2 (two) times daily. 10/09/11  Yes Michele Mcalpine, MD  Multiple Vitamin (MULTIVITAMIN WITH MINERALS) TABS Take 1 tablet by mouth daily.   Yes Historical Provider, MD  Naproxen Sodium (ALEVE) 220 MG CAPS Take 1 capsule by mouth 2 (two) times daily.   Yes Historical Provider, MD  tiotropium (SPIRIVA) 18 MCG inhalation capsule Place 1 capsule (18 mcg total) into inhaler and inhale daily. 10/09/11  Yes Michele Mcalpine, MD  vitamin C (ASCORBIC ACID) 500 MG tablet Take 500 mg by mouth daily.     Yes Historical Provider, MD  zolpidem (AMBIEN) 10 MG tablet Take 5 mg by mouth at bedtime as needed. For insomnia 10/09/11 02/23/12 Yes Michele Mcalpine, MD    Past Medical History: Past Medical History  Diagnosis Date  . Disorders of diaphragm   . Unspecified essential hypertension   . Endocarditis, valve unspecified, unspecified cause   . Cardiac dysrhythmia, unspecified   . Unspecified venous (peripheral) insufficiency   . Edema   . Pure hypercholesterolemia   . Other dysphagia   . Diverticulosis of colon (without mention of hemorrhage)   . Benign neoplasm of colon   . Osteoarthrosis, unspecified whether generalized or localized, unspecified site   . Lumbago   . Chronic pain syndrome   . Disorder of bone and cartilage, unspecified   . Depressive disorder, not elsewhere classified   . Anemia, unspecified   . Skin cancer   . Systolic heart failure     echo 10/10/10 EF 30%  . Hiatal hernia   . Gastritis     Past Surgical History: Past Surgical History  Procedure Date  . Appendectomy   . Breast surgery   . Right wrist surgery   . Bilateral cataracts   . Hip surgery   . Tonsillectomy   . Adenoidectomy     Family History: Family History  Problem  Relation Age of Onset  . Diabetes Mother   . Heart disease Father   . Lung cancer Sister     Social History: History   Social History  . Marital Status: Single    Spouse Name: N/A    Number of Children: 2  . Years of Education: N/A   Occupational History  . Retired  - worked at a [employer] sales [employer]    Social History Main Topics  . Smoking status: Never Smoker   . Smokeless tobacco: Never Used  . Alcohol  Use: No  . Drug Use: No  . Sexually Active: Not Currently   Other Topics Concern  . None   Social History Narrative   Pt has independent apartment at [facility].    Allergies:  Allergies  Allergen Reactions  . Sulfonamide Derivatives Swelling    Objective:    Vital Signs:   Temp:  [97.6 F (36.4 C)-98.1 F (36.7 C)] 97.6 F (36.4 C) (10/31 1403) Pulse Rate:  [60-75] 64  (10/31 1403) Resp:  [16-18] 18  (10/31 0908) BP: (111-147)/(50-71) 118/50 mmHg (10/31 1403) SpO2:  [97 %-100 %] 100 % (10/31 1403) Last BM Date: 10/31/11  Weight change: Filed Weights   11/01/11 0008  Weight: 67.2 kg (148 lb 2.4 oz)    Intake/Output:   Intake/Output Summary (Last 24 hours) at 11/02/11 1517 Last data filed at 11/02/11 1300  Gross per 24 hour  Intake   1108 ml  Output    850 ml  Net    258 ml    Weight 148 Physical Exam: General: Well appearing. No respiratory difficulty, on O2.  HEENT: normal  Neck: supple. JVP 11-12. Carotids 2+ bilat; no bruits. No lymphadenopathy or thryomegaly appreciated.  Cor: PMI nondisplaced. Regular rate & rhythm. No rubs, gallops or murmurs.  Lungs: CTA  Abdomen: soft, nontender, nondistended. No hepatosplenomegaly. No bruits or masses. Good bowel sounds.  Extremities: no cyanosis, clubbing, rash, +1 ankle edema, resolving hematoma Lt distal shin  Neuro: alert & oriented x 3, cranial nerves grossly intact. moves all 4 extremities w/o difficulty. Affect pleasant.  Telemetry: Wandering atrial pacemaker 60-80  Labs: Basic Metabolic Panel:  Lab  11/01/11 0610 11/01/11 0153 10/31/11 1742  NA 135 -- 140  K 3.6 -- 3.9  CL 101 -- 102  CO2 25 -- 28  GLUCOSE 79 -- 121*  BUN 12 -- 15  CREATININE 0.73 0.73 0.78  CALCIUM 8.7 -- 9.6  MG -- -- --  PHOS -- -- --    Liver Function Tests:  Lab 10/31/11 1742  AST 14  ALT 10  ALKPHOS 107  BILITOT 0.4  PROT 7.0  ALBUMIN 2.9*    Lab 10/31/11 1830  LIPASE 7*  AMYLASE --   No results found for this basename: AMMONIA:3 in the last 168 hours  CBC:  Lab 11/02/11 0623 11/01/11 0610 11/01/11 0153 10/31/11 1742  WBC 18.7* 16.5* 16.4* 18.7*  NEUTROABS -- -- -- 15.1*  HGB 9.2* 8.7* 8.7* 10.3*  HCT 30.3* 28.2* 27.9* 32.5*  MCV 87.1 86.5 86.4 86.2  PLT 509* 505* 504* 574*    Cardiac Enzymes: No results found for this basename: CKTOTAL:5,CKMB:5,CKMBINDEX:5,TROPONINI:5 in the last 168 hours  BNP: BNP (last 3 results)  Basename 11/02/11 0623 08/02/11 1630 05/05/11 1553  PROBNP 4539.0* 1420.0* 368.0*      Imaging: Ct Abdomen Pelvis W Contrast  10/31/2011  *RADIOLOGY REPORT*  Clinical Data: Right lower quadrant pain.  Lower abdominal pain and nausea.  CT ABDOMEN AND PELVIS WITH CONTRAST  Technique:  Multidetector CT imaging of the abdomen and pelvis was performed following the standard protocol during bolus administration of intravenous contrast.  Contrast: OMNIPAQUE IOHEXOL 300 MG/ML  SOLN  Comparison: CT abdomen and pelvis with contrast 10/09/2011.  Findings: Mild dependent atelectasis is present at the right lung base.  Elevation of the right hemidiaphragm is stable.  Heart size is normal.  No significant pleural or pericardial effusion is present.  The liver and spleen are within normal limits.  A small hiatal hernia is  stable.  The stomach and duodenum are within normal limits.  A hypodense pancreatic mass at the uncinate process measures at least 3.7 x 1.4 cm, similar to the prior exam.  There is mild pancreatic duct dilation.  The adrenal glands are normal bilaterally.   Renal cysts are stable.  Atherosclerotic calcifications are noted in the aorta.  Fluid levels are present in the colon.  Free fluid is similar to the prior exam.  Extensive wall thickening and inflammatory changes present through the sigmoid colon.  Diverticular changes are evident in the descending colon with additional more mild inflammatory changes.  The more proximal transverse colon is unremarkable.  Small bowel is within normal limits.  Asymmetric wall thickening is noted along the urinary bladder, new from prior last exam.  The patient is status post right total hip arthroplasty. Degenerative changes are again noted in the lower lumbar spine.  IMPRESSION:  1.  Stable hypodense mass at the onset process of the pancreas may represent a cystic neoplasm.  This recommend non emergent ultrasound for further evaluation. 2.  Stable atrophic and cystic changes of the kidneys. 3.  Nonobstructing 5 mm stone at the lower pole of the left kidney. 4.  Changes compatible with sigmoid colitis, likely secondary to diverticular disease. 5.  Free fluid in the anatomic pelvis. 6.  Atherosclerosis.   Original Report Authenticated By: Jamesetta Orleans. MATTERN, M.D.       Medications:     Current Medications:    . aspirin EC  81 mg Oral Daily  . calcium-vitamin D  1 tablet Oral BID  . carvedilol  3.125 mg Oral BID WC  . cholecalciferol  2,000 Units Oral Daily  . ciprofloxacin  400 mg Intravenous Q12H  . citalopram  20 mg Oral Daily  . clotrimazole   Topical BID  . ferrous sulfate  325 mg Oral BID WC  . furosemide  20 mg Oral Daily  . heparin  5,000 Units Subcutaneous Q8H  . lisinopril  5 mg Oral Daily  . loratadine  10 mg Oral Daily  . metronidazole  500 mg Intravenous Q8H  . mometasone-formoterol  2 puff Inhalation BID  . multivitamin with minerals  1 tablet Oral Daily  . naproxen  250 mg Oral BID WC  . OxyCODONE  10 mg Oral Q12H  . pneumococcal 23 valent vaccine  0.5 mL Intramuscular Tomorrow-1000  .  sodium chloride  3 mL Intravenous Q12H  . tiotropium  18 mcg Inhalation Daily  . vitamin C  500 mg Oral Daily  . DISCONTD: antiseptic oral rinse  15 mL Mouth Rinse q12n4p  . DISCONTD: chlorhexidine  15 mL Mouth Rinse BID     Infusions:      Assessment:   1. Acute on chronic diastolic HF in setting of volume loading 2. Sigmoid colitis     - on antibiotics and progressing diet 3. H/o syncope recently - spiro stopped     - on telemetry 4. COPD/restrictive lung disease     - chronic O2 use  Plan/Discussion:    Ms. Viernes has mild volume overload on exam, most likely due to IVF given for sigmoid colitis.  Agree with addition of lasix but would increase back to home dose 40 mg daily.  Will check daily weights and follow renal function closely.    As far as her syncope is concerned I have reviewed telemetry monitor and no bradycardia/pauses or tachy/brady syndrome noted.  Irregular rhythm appears to be NSR with PAC/PVCs.  Will continue to monitor on telemetry while in house.  She will need to complete cardiac event monitor upon discharge for further evaluation.     Length of Stay: 2  Robbi Garter, [facility] 11/02/2011, 3:17 PM  Patient seen and examined with Ulyess Blossom, PA-C. We discussed all aspects of the encounter. I agree with the assessment and plan as stated above.   Patient well known to [place] from HF clinic. She has mild volume overload in the setting of IVF resuscitation for sigmoid colitis. Will give one dose IV lasix and then start lasix 40 mg daily for a few days. We will follow.   Deunta Beneke,MD 4:31 PM

## 2011-11-02 NOTE — Evaluation (Signed)
Physical Therapy Evaluation Patient Details Name: Katie Galvan MRN: [id number] DOB: [birth year] Today's Date: 11/02/2011 Time: 4098-1191 PT Time Calculation (min): 20 min  PT Assessment / Plan / Recommendation Clinical Impression  Admitted for diverticulitis of sigmoid colon; Pt. exhibits decreased gait endurance and activity tolerance. After standing and taking a few steps pt. stumbled but was able to right herself without physical assitance. Pt. is appropriate for acute PT at this time to address transfers and gait so pt. may return home safely. Pt. stated someone had been coming to walk with her a few times week but she was unsure if it was an aid or physical therapist.    PT Assessment  Patient needs continued PT services    Follow Up Recommendations  Home health PT       Barriers to Discharge None      Equipment Recommendations  None recommended by PT       Frequency Min 3X/week    Precautions / Restrictions Precautions Precautions: None Restrictions Weight Bearing Restrictions: No   Pertinent Vitals/Pain Pt reported no pain      Mobility  Bed Mobility Bed Mobility: Supine to Sit;Sitting - Scoot to Edge of Bed Supine to Sit: 5: Supervision Sitting - Scoot to Edge of Bed: 6: Modified independent (Device/Increase time) Details for Bed Mobility Assistance: Pt. supervision for supine to sit due to pt. using rails for assistance. Transfers Transfers: Sit to Stand;Stand to Sit Sit to Stand: 5: Supervision Stand to Sit: 5: Supervision Details for Transfer Assistance: Pt. required supervision for cueing for hand placement. Ambulation/Gait Ambulation/Gait Assistance: 4: Min guard Ambulation Distance (Feet): 80 Feet Assistive device: Rolling walker Ambulation/Gait Assistance Details: Pt. required min guard for safety due to pt. stumbling after taking a few steps and cueing to stay inside walker. Gait Pattern: Step-through pattern;Decreased stride length;Trunk  flexed Gait velocity: decreased Stairs: No           PT Diagnosis: Difficulty walking  PT Problem List: Decreased activity tolerance;Decreased mobility PT Treatment Interventions: DME instruction;Gait training;Functional mobility training;Therapeutic activities;Therapeutic exercise;Balance training;Patient/family education   PT Goals Acute Rehab PT Goals PT Goal Formulation: With patient Time For Goal Achievement: 11/09/11 Potential to Achieve Goals: Good Pt will go Sit to Stand: with modified independence PT Goal: Sit to Stand - Progress: Goal set today Pt will go Stand to Sit: with modified independence PT Goal: Stand to Sit - Progress: Goal set today Pt will Ambulate: >150 feet;with modified independence;with rolling walker PT Goal: Ambulate - Progress: Goal set today  Visit Information  Last PT Received On: 11/02/11 Assistance Needed: +1    Subjective Data  Subjective: I'll be gald to get out of bed Patient Stated Goal: Return home   Prior Functioning  Home Living Lives With: Alone Available Help at Discharge: Family Type of Home: Apartment Home Access: Elevator (Pt. takes elevator to 3rd floor) Home Layout: One level Bathroom Shower/Tub: Health visitor: Standard Bathroom Accessibility: Yes Home Adaptive Equipment: Environmental consultant - four wheeled;Built-in shower seat;Grab bars in shower;Hand-held shower hose;Raised toilet seat with rails Prior Function Level of Independence: Independent with assistive device(s) (Pt. has assitance with cleaining.) Driving: No Vocation: Retired Musician: No difficulties    Cognition  Overall Cognitive Status: Appears within functional limits for tasks assessed/performed Arousal/Alertness: Awake/alert Orientation Level: Appears intact for tasks assessed Behavior During Session: [facility] for tasks performed          End of Session PT - End of Session Equipment Utilized During Treatment: Gait  belt Activity  Tolerance: Patient limited by fatigue Patient left: in chair;with call bell/phone within reach Nurse Communication: Mobility status   Army Chaco SPT 11/02/2011, 10:31 AM

## 2011-11-02 NOTE — Progress Notes (Signed)
Addendum  Patient seen and examined, chart and data base reviewed.  I agree with the above assessment and plan.  For full details please see Mrs. Algis Downs PA. Note.  Acute diverticulitis, on Cipro and Flagyl.   Clint Lipps, MD [facility] Pager: [phone] 11/02/2011, 4:54 PM

## 2011-11-02 NOTE — Evaluation (Signed)
Seen and agree with SPT note Refugio Vandevoorde Tabor Taniah Reinecke, PT [phone]  

## 2011-11-03 ENCOUNTER — Telehealth: Payer: Self-pay | Admitting: Medical Oncology

## 2011-11-03 DIAGNOSIS — K869 Disease of pancreas, unspecified: Secondary | ICD-10-CM

## 2011-11-03 LAB — BASIC METABOLIC PANEL
BUN: 15 mg/dL (ref 6–23)
Creatinine, Ser: 0.86 mg/dL (ref 0.50–1.10)
GFR calc Af Amer: 71 mL/min — ABNORMAL LOW (ref >90)
GFR calc non Af Amer: 61 mL/min — ABNORMAL LOW (ref >90)
Glucose, Bld: 124 mg/dL — ABNORMAL HIGH (ref 70–99)
Potassium: 4.4 mEq/L (ref 3.5–5.1)

## 2011-11-03 LAB — CBC
HCT: 27.9 % — ABNORMAL LOW (ref 36.0–46.0)
Hemoglobin: 8.5 g/dL — ABNORMAL LOW (ref 12.0–15.0)
MCH: 26.2 pg (ref 26.0–34.0)
MCHC: 30.5 g/dL (ref 30.0–36.0)
MCV: 86.1 fL (ref 78.0–100.0)
RDW: 15.2 % (ref 11.5–15.5)

## 2011-11-03 MED ORDER — OXYCODONE HCL ER 10 MG PO T12A: 10.0000 mg | EXTENDED_RELEASE_TABLET | Freq: Two times a day (BID) | ORAL | Status: DC

## 2011-11-03 MED ORDER — METRONIDAZOLE 500 MG PO TABS
500.0000 mg | ORAL_TABLET | Freq: Three times a day (TID) | ORAL | Status: DC
  Administered 2011-11-03 (×2): 500 mg via ORAL
  Filled 2011-11-03 (×4): qty 1

## 2011-11-03 MED ORDER — CIPROFLOXACIN HCL 750 MG PO TABS: 750.0000 mg | ORAL_TABLET | Freq: Two times a day (BID) | ORAL | Status: DC

## 2011-11-03 MED ORDER — METRONIDAZOLE 500 MG PO TABS: 500.0000 mg | ORAL_TABLET | Freq: Three times a day (TID) | ORAL | Status: DC

## 2011-11-03 MED ORDER — PNEUMOCOCCAL VAC POLYVALENT 25 MCG/0.5ML IJ INJ
0.5000 mL | INJECTION | Freq: Once | INTRAMUSCULAR | Status: DC
  Filled 2011-11-03: qty 0.5

## 2011-11-03 MED ORDER — POLYETHYLENE GLYCOL 3350 17 G PO PACK
17.0000 g | PACK | Freq: Every day | ORAL | Status: DC
  Filled 2011-11-03: qty 1

## 2011-11-03 MED ORDER — CIPROFLOXACIN HCL 500 MG PO TABS
500.0000 mg | ORAL_TABLET | Freq: Two times a day (BID) | ORAL | Status: DC
  Administered 2011-11-03: 500 mg via ORAL
  Filled 2011-11-03 (×3): qty 1

## 2011-11-03 MED ORDER — CIPROFLOXACIN HCL 500 MG PO TABS: 500.0000 mg | ORAL_TABLET | Freq: Two times a day (BID) | ORAL | Status: DC

## 2011-11-03 MED ORDER — POLYETHYLENE GLYCOL 3350 17 G PO PACK: 17.0000 g | PACK | Freq: Every day | ORAL | Status: DC

## 2011-11-03 MED ORDER — FUROSEMIDE 40 MG PO TABS: 40.0000 mg | ORAL_TABLET | Freq: Every day | ORAL | Status: DC | PRN

## 2011-11-03 NOTE — Progress Notes (Signed)
Seen and agree with SPT note Jewelianna Pancoast Tabor Yarixa Lightcap, PT [phone]  

## 2011-11-03 NOTE — Discharge Summary (Signed)
Addendum  Patient seen and examined, chart and data base reviewed.  I agree with the above assessment and discharge plan.  For full details please see Mrs. Algis Downs PA. Note.  Diverticulitis, patient able to tolerate soft diet, will be discharged on Cipro and Flagyl.  Pancreatic cystic lesion, asymptomatic patient will follow with [facility] as outpatient.   Clint Lipps, MD [facility] Pager: [phone] 11/03/2011, 1:39 PM

## 2011-11-03 NOTE — Progress Notes (Signed)
Physical Therapy Treatment Patient Details Name: Katie Galvan MRN: [id number] DOB: 1927-08-19 Today's Date: 11/03/2011 Time: 1191-4782 PT Time Calculation (min): 26 min  PT Assessment / Plan / Recommendation Comments on Treatment Session  Admitted with diverticulitis of sigmoid colon; Pt. tolerated ambulation better today and did not stumble. However, continues to need education on proper use of DME. Pt.notified that she will recieve HH once discharged from the hospital.                      Plan Discharge plan remains appropriate;Frequency remains appropriate    Precautions / Restrictions Precautions Precautions: None Restrictions Weight Bearing Restrictions: No   Pertinent Vitals/Pain No pain reported    Mobility  Transfers Sit to Stand: 5: Supervision Stand to Sit: 5: Supervision Details for Transfer Assistance: Performed sit <-> stand x2. Pt. required cueing for hand placement. Ambulation/Gait Ambulation/Gait Assistance: 4: Min guard Ambulation Distance (Feet): 130 Feet (Pt. ambulated to bathroom and then down hallway) Assistive device: Rolling walker Ambulation/Gait Assistance Details: Pt. required min guard for cueing to stand up straight and to stay inside walker. Pt. also had difficulty navigating around objects as she would catch objects in the hallway with her walker. Gait Pattern: Step-through pattern;Decreased stride length;Trunk flexed Gait velocity: decreased Stairs: No    Exercises General Exercises - Lower Extremity Long Arc Quad: AROM;Both;20 reps;Seated Hip Flexion/Marching: AROM;Both;20 reps;Seated    PT Goals Acute Rehab PT Goals PT Goal: Sit to Stand - Progress: Progressing toward goal PT Goal: Stand to Sit - Progress: Progressing toward goal PT Goal: Ambulate - Progress: Progressing toward goal  Visit Information  Last PT Received On: 11/03/11 Assistance Needed: +1    Subjective Data  Subjective: "I've been walking to the bathroom."     Cognition  Overall Cognitive Status: Appears within functional limits for tasks assessed/performed Arousal/Alertness: Awake/alert Orientation Level: Appears intact for tasks assessed Behavior During Session: [facility] for tasks performed    Balance     End of Session PT - End of Session Equipment Utilized During Treatment: Gait belt Activity Tolerance: Patient tolerated treatment well Patient left: in chair;with call bell/phone within reach Nurse Communication: Mobility status    Army Chaco SPT 11/03/2011, 1:00 PM

## 2011-11-03 NOTE — Progress Notes (Signed)
Advanced Heart Failure Team Consult Note  Referring Physician: [facility] Primary Physician: [name] Primary Cardiologist: Dr. Gala Romney  Reason for Consultation: HF and arrhythmias   HPI:    Ms. Glacken is an 78 yo with history of mixed diastolic and systolic HF, LVEF 30% but EF has recovered to 65-70% per echo 08/03/11.  She also has restrictive lung disease, right hemidiaphragm elevation, COPD, GERD, history of endocarditis.  Last month admitted for syncope of unclear etiology. Seen in clinic and scheduled for holter monitor but has not picked up the device yet.  Volume status was stable at 143 pounds.  BP low in clinic so spironolactone was discontinued.    She was admitted with abdominal pain and CT scan showed changes compatible with sigmoid colitis felt secondary to diverticular disease.  WBC 18.  She was made NPO and started on 75 cc/hr for 12 hours and cipro/flagyl.  Her diet is currently being advanced as tolerated.  She is taking po meds.  She was restarted on her lasix 20 mg today.    Got 40mg  iv lasix yesterday with good output. Breathing ok. Still feels weak. We weighed her on standing scale weight 148.5  Prior to Admission medications   Medication Sig Start Date End Date Taking? Authorizing Provider  aspirin 81 MG EC tablet Take 81 mg by mouth daily.     Yes Historical Provider, MD  Calcium Carbonate-Vit D-Min (CALTRATE 600+D PLUS) 600-400 MG-UNIT per tablet Chew 1 tablet by mouth 2 (two) times daily.    Yes Historical Provider, MD  carvedilol (COREG) 6.25 MG tablet Take 0.5 tablets (3.125 mg total) by mouth 2 (two) times daily with a meal. 10/09/11  Yes Michele Mcalpine, MD  cholecalciferol (VITAMIN D) 1000 UNITS tablet Take 2,000 Units by mouth daily.   Yes Historical Provider, MD  citalopram (CELEXA) 20 MG tablet Take 1 tablet (20 mg total) by mouth daily. 10/09/11  Yes Michele Mcalpine, MD  clotrimazole-betamethasone (LOTRISONE) cream Apply 1 application topically 2  (two) times daily as needed. For rashes on forehead.   Yes Historical Provider, MD  ferrous sulfate 325 (65 FE) MG tablet Take 325 mg by mouth 2 (two) times daily.     Yes Historical Provider, MD  Fluticasone-Salmeterol (ADVAIR DISKUS) 100-50 MCG/DOSE AEPB Inhale 1 puff into the lungs every 12 (twelve) hours. 10/09/11  Yes Michele Mcalpine, MD  furosemide (LASIX) 40 MG tablet Take 40 mg by mouth daily as needed. For edema 10/09/11  Yes Michele Mcalpine, MD  lisinopril (PRINIVIL,ZESTRIL) 5 MG tablet Take 5 mg by mouth daily.   Yes Historical Provider, MD  loratadine (CLARITIN) 10 MG tablet Take 10 mg by mouth daily.     Yes Historical Provider, MD  metroNIDAZOLE (FLAGYL) 500 MG tablet Take 500 mg by mouth 3 (three) times daily. For 10 days; Start date 10/31/11 10/30/11  Yes Michele Mcalpine, MD  morphine (MS CONTIN) 15 MG 12 hr tablet Take 1 tablet (15 mg total) by mouth 2 (two) times daily. 10/09/11  Yes Michele Mcalpine, MD  Multiple Vitamin (MULTIVITAMIN WITH MINERALS) TABS Take 1 tablet by mouth daily.   Yes Historical Provider, MD  Naproxen Sodium (ALEVE) 220 MG CAPS Take 1 capsule by mouth 2 (two) times daily.   Yes Historical Provider, MD  tiotropium (SPIRIVA) 18 MCG inhalation capsule Place 1 capsule (18 mcg total) into inhaler and inhale daily. 10/09/11  Yes Michele Mcalpine, MD  vitamin C (ASCORBIC ACID) 500 MG  tablet Take 500 mg by mouth daily.     Yes Historical Provider, MD  zolpidem (AMBIEN) 10 MG tablet Take 5 mg by mouth at bedtime as needed. For insomnia 10/09/11 02/23/12 Yes Michele Mcalpine, MD    Past Medical History: Past Medical History  Diagnosis Date  . Disorders of diaphragm   . Unspecified essential hypertension   . Endocarditis, valve unspecified, unspecified cause   . Cardiac dysrhythmia, unspecified   . Unspecified venous (peripheral) insufficiency   . Edema   . Pure hypercholesterolemia   . Other dysphagia   . Diverticulosis of colon (without mention of hemorrhage)   . Benign neoplasm  of colon   . Osteoarthrosis, unspecified whether generalized or localized, unspecified site   . Lumbago   . Chronic pain syndrome   . Disorder of bone and cartilage, unspecified   . Depressive disorder, not elsewhere classified   . Anemia, unspecified   . Skin cancer   . Systolic heart failure     echo 10/10/10 EF 30%  . Hiatal hernia   . Gastritis     Past Surgical History: Past Surgical History  Procedure Date  . Appendectomy   . Breast surgery   . Right wrist surgery   . Bilateral cataracts   . Hip surgery   . Tonsillectomy   . Adenoidectomy     Family History: Family History  Problem Relation Age of Onset  . Diabetes Mother   . Heart disease Father   . Lung cancer Sister     Social History: History   Social History  . Marital Status: Single    Spouse Name: N/A    Number of Children: 2  . Years of Education: N/A   Occupational History  . Retired  - worked at a Technical sales engineer    Social History Main Topics  . Smoking status: Never Smoker   . Smokeless tobacco: Never Used  . Alcohol Use: No  . Drug Use: No  . Sexually Active: Not Currently   Other Topics Concern  . None   Social History Narrative   Pt has independent apartment at [employer].    Allergies:  Allergies  Allergen Reactions  . Sulfonamide Derivatives Swelling    Objective:    Vital Signs:   Temp:  [97.6 F (36.4 C)-98.7 F (37.1 C)] 98.7 F (37.1 C) (11/01 0455) Pulse Rate:  [64-74] 74  (11/01 1000) Resp:  [16-18] 16  (11/01 0455) BP: (117-130)/(48-76) 117/48 mmHg (11/01 1000) SpO2:  [97 %-100 %] 98 % (11/01 0813) Weight:  [68.901 kg (151 lb 14.4 oz)-69.1 kg (152 lb 5.4 oz)] 69.1 kg (152 lb 5.4 oz) (11/01 1135) Last BM Date: 10/31/11  Weight change: Filed Weights   11/01/11 0008 11/02/11 1814 11/03/11 1135  Weight: 67.2 kg (148 lb 2.4 oz) 68.901 kg (151 lb 14.4 oz) 69.1 kg (152 lb 5.4 oz)    Intake/Output:   Intake/Output Summary (Last 24 hours) at 11/03/11 1146 Last data filed  at 11/03/11 0758  Gross per 24 hour  Intake    898 ml  Output   1975 ml  Net  -1077 ml    Weight 148 Physical Exam: General: Well appearing. No respiratory difficulty, on O2.  HEENT: normal  Neck: supple. JVP 8 Carotids 2+ bilat; no bruits. No lymphadenopathy or thryomegaly appreciated.  Cor: PMI nondisplaced. Regular rate & rhythm. No rubs, gallops or murmurs.  Lungs: CTA  Abdomen: soft, nontender, nondistended. No hepatosplenomegaly. No bruits or masses. Good bowel sounds.  Extremities: no cyanosis, clubbing, rash, tr ankle edema, resolving hematoma Lt distal shin  Neuro: alert & oriented x 3, cranial nerves grossly intact. moves all 4 extremities w/o difficulty. Affect pleasant.  Telemetry: Wandering atrial pacemaker 60-80  Labs: Basic Metabolic Panel:  Lab 11/03/11 1610 11/01/11 0610 11/01/11 0153 10/31/11 1742  NA 136 135 -- 140  K 4.4 3.6 -- 3.9  CL 101 101 -- 102  CO2 29 25 -- 28  GLUCOSE 124* 79 -- 121*  BUN 15 12 -- 15  CREATININE 0.86 0.73 0.73 0.78  CALCIUM 9.0 8.7 -- 9.6  MG -- -- -- --  PHOS -- -- -- --    Liver Function Tests:  Lab 10/31/11 1742  AST 14  ALT 10  ALKPHOS 107  BILITOT 0.4  PROT 7.0  ALBUMIN 2.9*    Lab 10/31/11 1830  LIPASE 7*  AMYLASE --   No results found for this basename: AMMONIA:3 in the last 168 hours  CBC:  Lab 11/03/11 0628 11/02/11 0623 11/01/11 0610 11/01/11 0153 10/31/11 1742  WBC 15.7* 18.7* 16.5* 16.4* 18.7*  NEUTROABS -- -- -- -- 15.1*  HGB 8.5* 9.2* 8.7* 8.7* 10.3*  HCT 27.9* 30.3* 28.2* 27.9* 32.5*  MCV 86.1 87.1 86.5 86.4 86.2  PLT 505* 509* 505* 504* 574*    Cardiac Enzymes: No results found for this basename: CKTOTAL:5,CKMB:5,CKMBINDEX:5,TROPONINI:5 in the last 168 hours  BNP: BNP (last 3 results)  Basename 11/03/11 0628 11/02/11 0623 08/02/11 1630  PROBNP 1925.0* 4539.0* 1420.0*      Imaging: No results found.   Medications:     Current Medications:    . aspirin EC  81 mg Oral Daily    . calcium-vitamin D  1 tablet Oral BID  . carvedilol  3.125 mg Oral BID WC  . cholecalciferol  2,000 Units Oral Daily  . ciprofloxacin  500 mg Oral BID  . citalopram  20 mg Oral Daily  . clotrimazole   Topical BID  . ferrous sulfate  325 mg Oral BID WC  . furosemide  40 mg Intravenous Once  . furosemide  40 mg Oral Daily  . heparin  5,000 Units Subcutaneous Q8H  . lisinopril  5 mg Oral Daily  . loratadine  10 mg Oral Daily  . metroNIDAZOLE  500 mg Oral Q8H  . mometasone-formoterol  2 puff Inhalation BID  . multivitamin with minerals  1 tablet Oral Daily  . naproxen  250 mg Oral BID WC  . OxyCODONE  10 mg Oral Q12H  . pneumococcal 23 valent vaccine  0.5 mL Intramuscular Tomorrow-1000  . pneumococcal 23 valent vaccine  0.5 mL Intramuscular Once  . polyethylene glycol  17 g Oral Daily  . potassium chloride  40 mEq Oral Once  . sodium chloride  3 mL Intravenous Q12H  . tiotropium  18 mcg Inhalation Daily  . vitamin C  500 mg Oral Daily  . DISCONTD: antiseptic oral rinse  15 mL Mouth Rinse q12n4p  . DISCONTD: chlorhexidine  15 mL Mouth Rinse BID  . DISCONTD: ciprofloxacin  400 mg Intravenous Q12H  . DISCONTD: ciprofloxacin  750 mg Oral BID  . DISCONTD: furosemide  20 mg Oral Daily  . DISCONTD: metronidazole  500 mg Intravenous Q8H    Infusions:     Assessment:   1. Acute on chronic diastolic HF in setting of volume loading 2. Sigmoid colitis     - on antibiotics and progressing diet 3. H/o syncope recently - spiro stopped     -  on telemetry 4. COPD/restrictive lung disease     - chronic O2 use  Plan/Discussion:    Volume status improved but still not back to baseline. Would give po lasix for 2 more days on d/c then 40 mg prn for weight greater than 145. We will see her next week in HF clinic.  As far as her syncope is concerned I have reviewed telemetry monitor and no bradycardia/pauses or tachy/brady syndrome noted.  Irregular rhythm appears to be NSR with PAC/PVCs.   She will need to complete cardiac event monitor upon discharge for further evaluation.    D/w [facility]. Appreciate their care.    Length of Stay: 3.   Antonae Zbikowski,MD 11:46 AM

## 2011-11-03 NOTE — Telephone Encounter (Signed)
Requested f/u with Dr Arbutus Ped to evaluate need for iron. I will notify dr Fredderick Severance

## 2011-11-03 NOTE — Progress Notes (Signed)
Pt. discharge to floor,verbalized understanding of discharged instruction,medication,restriction,diet and follow up appointment.Baseline Vitals sign stable,Pt comfortable,no sign and symptom of distress. 

## 2011-11-03 NOTE — Discharge Summary (Signed)
Physician Discharge Summary  Katie Galvan ZOX:[id number] DOB: November 05, 1927 DOA: 10/31/2011  PCP: Michele Mcalpine, MD  Admit date: 10/31/2011 Discharge date: 11/03/2011  Time spent:  40 minutes  Recommendations for Outpatient Follow-up:  1. Home health physical therapy 2. Wean off of narcotic pain medications 3. Pick up cardiac monitoring device from Dr. Prescott Gum office. 4. GI follow up for sigmoid colitis, and pancreatic mass. 5. Hematology, Dr. Asa Lente office, to call the patient with an appointment for anemia follow up.  Discharge Diagnoses:  Principal Problem:  *Diverticulitis of sigmoid colon Active Problems:  Pancreatic mass  Anemia  Chronic pain syndrome  HYPERTENSION  VALVULAR HEART DISEASE  DIVERTICULOSIS OF COLON  DEGENERATIVE JOINT DISEASE  Chronic respiratory failure with hypoxia  Diastolic CHF, chronic  Acute on chronic diastolic heart failure   Discharge Condition: Stable, Decreased abdominal pain.    Diet recommendation: low residue diet as tolerated  Filed Weights   11/01/11 0008 11/02/11 1814 11/03/11 1135  Weight: 67.2 kg (148 lb 2.4 oz) 68.901 kg (151 lb 14.4 oz) 69.1 kg (152 lb 5.4 oz)    History of present illness:  78 yo female with history of COPD, Oxygen therapy dependence, Mixed diastolic and systolic HF, and endocarditis,  presents with LLQ abdominal pain that started the previous day.  In the ED she was found to have a WBC of 18 and sigmoid colitis on CT.  Per her son she had been having falls recently but did not remember them.  She also had a recent evaluation for syncope.  Hospital Course:   Sigmoid colitis  CT abd/pelvis: Extensive wall thickening and inflammatory changes present through the sigmoid colon.  Cipro / Flagyl IV started on admission.  Diet was slowly advanced from NPO to Clears to soft solids.  She will be discharged on oral cipro and flagyl and will follow up with [facility] as scheduled.  Pancreatic Mass.  Lipase 7,  asymptomatic.  CT: Stable hypodense mass at the onset process of the pancreas may represent a cystic neoplasm. Mild pancreatic duct dilation.  Will refer to [facility] for outpatient management.   Normocytic Anemia  Patient has seen Heme/Onc in the past for IV iron.  5/13 Hgb was 12.  It appears baseline is approx 10 - 11.  Hgb at discharge is 8.5. Unfortunately she has had no stools to check for guiac.  Iron supplementation was continued.  Have requested follow up with hematology (Dr. Arbutus Ped) at discharge.   UTI  Culture negative, on cipro for colitis.  Cipro should treat any urinary tract infection.  Diastolic CHF with valvular heart disease  Patient became volume overloaded during her hospital stay and required gentle diuresis with IV lasix.  Her BNP rose to 4539.  Dr. Gala Romney was consulted and guided her heart failure care.  In addition she has a recent history of syncope and falling.  He reviewed her telemetry strips and has recommended out patient holter monitoring.  She will be seen at the HF clinic in follow up.  08/2011 Echo EF 65 - 70% with heavily calcified mitral valve.   She is currently on carvedilol, aspirin, and lasix (40 mg daily for two days, then PRN weight gain)  Falling  Has had multiple falls - doesn't necessarily remember them per her son.  Physical Therapy consulted, home health Physical Therapy was recommended and has been ordered. Patient is on chronic narcotic therapy. Reduced from MS contin 15 bid -> to Oxycontin 10 bid. Patient tolerating it well.  Recommend further weaning from narcotic pain medications as tolerated to avoid falling  Chronic Respiratory Failure  Stable.  On O2 2 liters 24/7   Consultations:  [facility], Dr. Gala Romney  Discharge Exam: Filed Vitals:   11/03/11 0455 11/03/11 0813 11/03/11 1000 11/03/11 1135  BP: 122/76  117/48   Pulse: 67  74   Temp: 98.7 F (37.1 C)     TempSrc: Oral     Resp: 16     Height:    5\' 4"  (1.626 m)    Weight:    69.1 kg (152 lb 5.4 oz)  SpO2: 97% 98%      General: A&O Pleasant, well appearing Cardiovascular: RRR, no M/R/G, Minimal Lower extremity edema Respiratory: CTA, no increased work of breathing. No W/C/R Abdomen:  Tender to palpation in lower quadrants bilaterally, soft, +BS, no distention, no masses  Discharge Instructions      Discharge Orders    Future Appointments: Provider: Department: Dept Phone: Center:   11/08/2011 2:30 PM [facility] [phone] None   11/20/2011 1:30 PM Amy Oswald Hillock, PA [facility] [phone] [facility]   11/21/2011 2:00 PM Michele Mcalpine, MD [facility] [phone] None   11/23/2011 1:45 PM [facility] [phone] None     Future Orders Please Complete By Expires   Diet - low sodium heart healthy      Increase activity slowly          Medication List     As of 11/03/2011  1:15 PM    STOP taking these medications         morphine 15 MG 12 hr tablet   Commonly known as: MS CONTIN      TAKE these medications         ALEVE 220 MG Caps   Generic drug: Naproxen Sodium   Take 1 capsule by mouth 2 (two) times daily.      aspirin 81 MG EC tablet   Take 81 mg by mouth daily.      CALTRATE 600+D PLUS 600-400 MG-UNIT per tablet   Chew 1 tablet by mouth 2 (two) times daily.      carvedilol 6.25 MG tablet   Commonly known as: COREG   Take 0.5 tablets (3.125 mg total) by mouth 2 (two) times daily with a meal.      cholecalciferol 1000 UNITS tablet   Commonly known as: VITAMIN D   Take 2,000 Units by mouth daily.      ciprofloxacin 500 MG tablet   Commonly known as: CIPRO   Take 1 tablet (500 mg total) by mouth 2 (two) times daily.      citalopram 20 MG tablet   Commonly known as: CELEXA   Take 1 tablet (20 mg total) by mouth daily.      clotrimazole-betamethasone cream   Commonly known as: LOTRISONE   Apply 1 application topically 2 (two) times daily as  needed. For rashes on forehead.      ferrous sulfate 325 (65 FE) MG tablet   Take 325 mg by mouth 2 (two) times daily.      Fluticasone-Salmeterol 100-50 MCG/DOSE Aepb   Commonly known as: ADVAIR   Inhale 1 puff into the lungs every 12 (twelve) hours.      furosemide 40 MG tablet   Commonly known as: LASIX   Take 1 tablet (40 mg total) by mouth daily as needed. Take 1 pill on November 2 and one pill  on November 3. Afterward, take 1 pill as needed for weight over 145 or leg swelling.      lisinopril 5 MG tablet   Commonly known as: PRINIVIL,ZESTRIL   Take 5 mg by mouth daily.      loratadine 10 MG tablet   Commonly known as: CLARITIN   Take 10 mg by mouth daily.      metroNIDAZOLE 500 MG tablet   Commonly known as: FLAGYL   Take 1 tablet (500 mg total) by mouth 3 (three) times daily. Take through November 7.      multivitamin with minerals Tabs   Take 1 tablet by mouth daily.      OxyCODONE 10 mg Tb12   Commonly known as: OXYCONTIN   Take 1 tablet (10 mg total) by mouth every 12 (twelve) hours.      polyethylene glycol packet   Commonly known as: MIRALAX / GLYCOLAX   Take 17 g by mouth daily. Do not take if you have diarrhea      tiotropium 18 MCG inhalation capsule   Commonly known as: SPIRIVA   Place 1 capsule (18 mcg total) into inhaler and inhale daily.      vitamin C 500 MG tablet   Commonly known as: ASCORBIC ACID   Take 500 mg by mouth daily.      zolpidem 10 MG tablet   Commonly known as: AMBIEN   Take 5 mg by mouth at bedtime as needed. For insomnia        Follow-up Information    Follow up with [name] Gip, PA. On 11/20/2011. (1:30 PM. This is follow up with GI staff. )    Contact information:   [address] [place] [address] [phone]       Follow up with Arvilla Meres, MD. On 11/08/2011. (at 2:30 Garage Code 8000)    Contact information:   [address] [place]  [address] [phone]       Follow up with NADEL,SCOTT M, MD. Schedule an appointment as soon as possible for a visit in 1 week.   Contact information:   [facility] P[facility] [address] [place] [address] [phone]       Follow up with [name]., MD. (Their office will call you with an appointment for treatment of anemia.)    Contact information:   [address] [place] [address] [phone]           The results of significant diagnostics from this hospitalization (including imaging, microbiology, ancillary and laboratory) are listed below for reference.    Significant Diagnostic Studies: Ct Abdomen Pelvis W Contrast  10/31/2011  *RADIOLOGY REPORT*  Clinical Data: Right lower quadrant pain.  Lower abdominal pain and nausea.  CT ABDOMEN AND PELVIS WITH CONTRAST  Technique:  Multidetector CT imaging of the abdomen and pelvis was performed following the standard protocol during bolus administration of intravenous contrast.  Contrast: OMNIPAQUE IOHEXOL 300 MG/ML  SOLN  Comparison: CT abdomen and pelvis with contrast 10/09/2011.  Findings: Mild dependent atelectasis is present at the right lung base.  Elevation of the right hemidiaphragm is stable.  Heart size is normal.  No significant pleural or pericardial effusion is present.  The liver and spleen are within normal limits.  A small hiatal hernia is stable.  The stomach and duodenum are within normal limits.  A hypodense pancreatic mass at the uncinate process measures at least  3.7 x 1.4 cm, similar to the prior exam.  There is mild pancreatic duct dilation.  The adrenal glands are normal bilaterally.  Renal cysts are stable.  Atherosclerotic calcifications are noted in the aorta.  Fluid levels are present in the colon.  Free fluid is similar to the prior exam.  Extensive wall thickening and inflammatory changes present through the sigmoid colon.  Diverticular changes are evident in the descending  colon with additional more mild inflammatory changes.  The more proximal transverse colon is unremarkable.  Small bowel is within normal limits.  Asymmetric wall thickening is noted along the urinary bladder, new from prior last exam.  The patient is status post right total hip arthroplasty. Degenerative changes are again noted in the lower lumbar spine.  IMPRESSION:  1.  Stable hypodense mass at the onset process of the pancreas may represent a cystic neoplasm.  This recommend non emergent ultrasound for further evaluation. 2.  Stable atrophic and cystic changes of the kidneys. 3.  Nonobstructing 5 mm stone at the lower pole of the left kidney. 4.  Changes compatible with sigmoid colitis, likely secondary to diverticular disease. 5.  Free fluid in the anatomic pelvis. 6.  Atherosclerosis.   Original Report Authenticated By: Jamesetta Orleans. MATTERN, M.D.     Microbiology: Recent Results (from the past 240 hour(s))  URINE CULTURE     Status: Normal   Collection Time   11/01/11  5:47 PM      Component Value Range Status Comment   Specimen Description URINE, RANDOM   Final    Special Requests NONE   Final    Culture  Setup Time 11/01/2011 18:12   Final    Colony Count NO GROWTH   Final    Culture NO GROWTH   Final    Report Status 11/02/2011 FINAL   Final      Labs: Basic Metabolic Panel:  Lab 11/03/11 1610 11/01/11 0610 11/01/11 0153 10/31/11 1742  NA 136 135 -- 140  K 4.4 3.6 -- 3.9  CL 101 101 -- 102  CO2 29 25 -- 28  GLUCOSE 124* 79 -- 121*  BUN 15 12 -- 15  CREATININE 0.86 0.73 0.73 0.78  CALCIUM 9.0 8.7 -- 9.6  MG -- -- -- --  PHOS -- -- -- --   Liver Function Tests:  Lab 10/31/11 1742  AST 14  ALT 10  ALKPHOS 107  BILITOT 0.4  PROT 7.0  ALBUMIN 2.9*    Lab 10/31/11 1830  LIPASE 7*  AMYLASE --   CBC:  Lab 11/03/11 0628 11/02/11 0623 11/01/11 0610 11/01/11 0153 10/31/11 1742  WBC 15.7* 18.7* 16.5* 16.4* 18.7*  NEUTROABS -- -- -- -- 15.1*  HGB 8.5* 9.2* 8.7* 8.7*  10.3*  HCT 27.9* 30.3* 28.2* 27.9* 32.5*  MCV 86.1 87.1 86.5 86.4 86.2  PLT 505* 509* 505* 504* 574*    BNP (last 3 results)  Basename 11/03/11 0628 11/02/11 0623 08/02/11 1630  PROBNP 1925.0* 4539.0* 1420.0*     Signed:  Stephani Police  [facility] [phone] 11/03/2011, 1:15 PM

## 2011-11-06 ENCOUNTER — Telehealth: Payer: Self-pay | Admitting: Internal Medicine

## 2011-11-06 ENCOUNTER — Other Ambulatory Visit: Payer: Self-pay | Admitting: Medical Oncology

## 2011-11-06 DIAGNOSIS — D649 Anemia, unspecified: Secondary | ICD-10-CM

## 2011-11-06 NOTE — Telephone Encounter (Signed)
per pof from 11/4 pt needs a f/u with the lab and dr Teola Bradley left a message for him to call for the appt

## 2011-11-06 NOTE — Telephone Encounter (Signed)
Recd pof from diane bell on 11/4 for this pt,looks like this is for a hosp f/u  Advised her to forward to HIM/Tiff

## 2011-11-08 ENCOUNTER — Ambulatory Visit (HOSPITAL_COMMUNITY)
Admit: 2011-11-08 | Discharge: 2011-11-08 | Disposition: A | Discharge status: Home / Self Care | Payer: Medicare Other | Attending: Internal Medicine | Admitting: Internal Medicine

## 2011-11-08 VITALS — BP 142/82 | HR 65 | Wt 147.5 lb

## 2011-11-08 DIAGNOSIS — R55 Syncope and collapse: Secondary | ICD-10-CM

## 2011-11-08 DIAGNOSIS — I5042 Chronic combined systolic (congestive) and diastolic (congestive) heart failure: Secondary | ICD-10-CM | POA: Insufficient documentation

## 2011-11-08 DIAGNOSIS — I5032 Chronic diastolic (congestive) heart failure: Secondary | ICD-10-CM

## 2011-11-08 DIAGNOSIS — I509 Heart failure, unspecified: Secondary | ICD-10-CM

## 2011-11-08 DIAGNOSIS — K5732 Diverticulitis of large intestine without perforation or abscess without bleeding: Secondary | ICD-10-CM

## 2011-11-08 LAB — CBC
HCT: 29.2 % — ABNORMAL LOW (ref 36.0–46.0)
MCH: 27.4 pg (ref 26.0–34.0)
MCV: 86.1 fL (ref 78.0–100.0)
Platelets: 614 10*3/uL — ABNORMAL HIGH (ref 150–400)
RBC: 3.39 MIL/uL — ABNORMAL LOW (ref 3.87–5.11)
RDW: 16.3 % — ABNORMAL HIGH (ref 11.5–15.5)
WBC: 14.8 10*3/uL — ABNORMAL HIGH (ref 4.0–10.5)

## 2011-11-08 LAB — COMPREHENSIVE METABOLIC PANEL
BUN: 22 mg/dL (ref 6–23)
CO2: 29 mEq/L (ref 19–32)
Calcium: 9.3 mg/dL (ref 8.4–10.5)
Chloride: 101 mEq/L (ref 96–112)
Creatinine, Ser: 0.81 mg/dL (ref 0.50–1.10)
GFR calc non Af Amer: 66 mL/min — ABNORMAL LOW (ref >90)
Total Bilirubin: 0.2 mg/dL — ABNORMAL LOW (ref 0.3–1.2)

## 2011-11-08 LAB — LIPASE, BLOOD: Lipase: 8 U/L — ABNORMAL LOW (ref 11–59)

## 2011-11-08 MED ORDER — FUROSEMIDE 40 MG PO TABS: 40.0000 mg | ORAL_TABLET | Freq: Every day | ORAL | Status: DC | PRN

## 2011-11-08 NOTE — Patient Instructions (Addendum)
Take lasix for weight 143 pounds or greater.    Labs today.  Pick up holter monitor today.  Follow up 3 weeks.

## 2011-11-09 NOTE — Progress Notes (Signed)
PCP/Pulmonologist: Dr [name]  HPI:  Katie Galvan is an 78 yo with history of mixed diastolic and systolic HF, LVEF 30%, restrictive lung disease, right hemidiaphragm elevation, COPD, GERD, history of endocarditis recently admitted for colitis requiring cipro/flagyl.  Presumed myocarditis treated with colchicine.  Echo 10/10/10: Hyperdynamic basal function Septal apical anterior hypokinesis The cavity size was severely dilated.  Wall thickness was increased in a pattern of moderate LVH.  There was mild concentric hypertrophy. The estimated ejection fraction was 30%.     12/12 ECHO EF recovered  55-60%  Echo 08/03/11 EF 65-70%.  Grade 1 diastolic dysfunction.  Mild MR.  Mildly dilated LA  She returns for post hospital follow up today with her daughter.  She was just discharged from [facility] last week for sigmoid colitis with WBC 18 and given cipro/flagyl with improvement but not resolution of her symptoms.  Today she continues to c/o nausea and abdominal tenderness.   She was given 2 days of lasix on discharge with plans to take lasix for weight 145 pounds or more.  She continues to feel dyspneic with minimal ambulation.  Weight is 144 pounds at home.  She denies orthopnea/PND.  No further syncope.  She has not picked up her holter monitor.       ROS: All other systems normal except as mentioned in HPI, past medical history and problem list.    Past Medical History  Diagnosis Date  . Disorders of diaphragm   . Unspecified essential hypertension   . Endocarditis, valve unspecified, unspecified cause   . Cardiac dysrhythmia, unspecified   . Unspecified venous (peripheral) insufficiency   . Edema   . Pure hypercholesterolemia   . Other dysphagia   . Diverticulosis of colon (without mention of hemorrhage)   . Benign neoplasm of colon   . Osteoarthrosis, unspecified whether generalized or localized, unspecified site   . Lumbago   . Chronic pain syndrome   . Disorder of bone and cartilage, unspecified    . Depressive disorder, not elsewhere classified   . Anemia, unspecified   . Skin cancer   . Systolic heart failure     echo 10/10/10 EF 30%  . Hiatal hernia   . Gastritis     Current Outpatient Prescriptions  Medication Sig Dispense Refill  . aspirin 81 MG EC tablet Take 81 mg by mouth daily.        . Calcium Carbonate-Vit D-Min (CALTRATE 600+D PLUS) 600-400 MG-UNIT per tablet Chew 1 tablet by mouth 2 (two) times daily.       . carvedilol (COREG) 6.25 MG tablet Take 0.5 tablets (3.125 mg total) by mouth 2 (two) times daily with a meal.  60 tablet  11  . cholecalciferol (VITAMIN D) 1000 UNITS tablet Take 2,000 Units by mouth daily.      . ciprofloxacin (CIPRO) 500 MG tablet Take 1 tablet (500 mg total) by mouth 2 (two) times daily.  14 tablet  0  . citalopram (CELEXA) 20 MG tablet Take 1 tablet (20 mg total) by mouth daily.  30 tablet  11  . clotrimazole-betamethasone (LOTRISONE) cream Apply 1 application topically 2 (two) times daily as needed. For rashes on forehead.      . ferrous sulfate 325 (65 FE) MG tablet Take 325 mg by mouth 2 (two) times daily.        . Fluticasone-Salmeterol (ADVAIR DISKUS) 100-50 MCG/DOSE AEPB Inhale 1 puff into the lungs every 12 (twelve) hours.  60 each  11  . furosemide (LASIX)  40 MG tablet Take 1 tablet (40 mg total) by mouth daily as needed. Take 1 pill as needed for weight over 143 or greater as well as leg swelling.  30 tablet  0  . lisinopril (PRINIVIL,ZESTRIL) 5 MG tablet Take 5 mg by mouth daily.      Marland Kitchen loratadine (CLARITIN) 10 MG tablet Take 10 mg by mouth daily.        . metroNIDAZOLE (FLAGYL) 500 MG tablet Take 1 tablet (500 mg total) by mouth 3 (three) times daily. Take through November 7.  21 tablet  0  . Multiple Vitamin (MULTIVITAMIN WITH MINERALS) TABS Take 1 tablet by mouth daily.      . Naproxen Sodium (ALEVE) 220 MG CAPS Take 1 capsule by mouth 2 (two) times daily.      . OxyCODONE (OXYCONTIN) 10 mg TB12 Take 1 tablet (10 mg total) by mouth  every 12 (twelve) hours.  40 tablet  0  . polyethylene glycol (MIRALAX / GLYCOLAX) packet Take 17 g by mouth daily. Do not take if you have diarrhea  14 each  0  . tiotropium (SPIRIVA) 18 MCG inhalation capsule Place 1 capsule (18 mcg total) into inhaler and inhale daily.  30 capsule  11  . vitamin C (ASCORBIC ACID) 500 MG tablet Take 500 mg by mouth daily.        Marland Kitchen zolpidem (AMBIEN) 10 MG tablet Take 5 mg by mouth at bedtime as needed. For insomnia      Lasix only as needed.   Allergies  Allergen Reactions  . Sulfonamide Derivatives Swelling    PHYSICAL EXAM: Filed Vitals:   11/08/11 1432  BP: 142/82  Pulse: 65  Weight: 147 lb 8 oz (66.906 kg)  SpO2: 97%     General:  Well appearing. No respiratory difficulty, on O2. HEENT: normal Neck: supple. JVP 7-8 . Carotids 2+ bilat; no bruits. No lymphadenopathy or thryomegaly appreciated. Cor: PMI nondisplaced. Regular rate & rhythm. No rubs, gallops or murmurs. Lungs: CTA Abdomen: soft, nontender, nondistended. No hepatosplenomegaly. No bruits or masses. Good bowel sounds. Extremities: no cyanosis, clubbing, rash, trace edema, resolving hematoma Lt distal shin Neuro: alert & oriented x 3, cranial nerves grossly intact. moves all 4 extremities w/o difficulty. Affect pleasant.   ASSESSMENT & PLAN:

## 2011-11-10 ENCOUNTER — Encounter: Payer: Self-pay | Admitting: Physician Assistant

## 2011-11-10 ENCOUNTER — Ambulatory Visit (INDEPENDENT_AMBULATORY_CARE_PROVIDER_SITE_OTHER): Payer: Medicare Other | Admitting: Physician Assistant

## 2011-11-10 ENCOUNTER — Telehealth: Payer: Self-pay | Admitting: Pulmonary Disease

## 2011-11-10 VITALS — BP 122/80 | HR 66 | Wt 149.0 lb

## 2011-11-10 DIAGNOSIS — K5792 Diverticulitis of intestine, part unspecified, without perforation or abscess without bleeding: Secondary | ICD-10-CM | POA: Insufficient documentation

## 2011-11-10 DIAGNOSIS — K5732 Diverticulitis of large intestine without perforation or abscess without bleeding: Secondary | ICD-10-CM

## 2011-11-10 DIAGNOSIS — R1030 Lower abdominal pain, unspecified: Secondary | ICD-10-CM

## 2011-11-10 DIAGNOSIS — K8689 Other specified diseases of pancreas: Secondary | ICD-10-CM

## 2011-11-10 DIAGNOSIS — K869 Disease of pancreas, unspecified: Secondary | ICD-10-CM

## 2011-11-10 DIAGNOSIS — R109 Unspecified abdominal pain: Secondary | ICD-10-CM

## 2011-11-10 MED ORDER — CIPROFLOXACIN HCL 500 MG PO TABS: 500.0000 mg | ORAL_TABLET | Freq: Two times a day (BID) | ORAL | Status: AC

## 2011-11-10 MED ORDER — CIPROFLOXACIN HCL 500 MG PO TABS: 500.0000 mg | ORAL_TABLET | Freq: Two times a day (BID) | ORAL | Status: DC

## 2011-11-10 MED ORDER — LISINOPRIL 5 MG PO TABS: 5.0000 mg | ORAL_TABLET | Freq: Every day | ORAL | Status: DC

## 2011-11-10 MED ORDER — METRONIDAZOLE 500 MG PO TABS: 500.0000 mg | ORAL_TABLET | Freq: Two times a day (BID) | ORAL | Status: AC

## 2011-11-10 NOTE — Progress Notes (Signed)
Agree with Ms. Esterwood's assessment and plan. Carl E. Gessner, MD, FACG   

## 2011-11-10 NOTE — Patient Instructions (Addendum)
Please go to the basement level to have your labs drawn.  We sent 7 more days of the Cipro and Flagyl  To [facility], [address]  We made an appointment to see Dr. Rob Bunting on 12-08-2011 at 1:45 PM.

## 2011-11-10 NOTE — Telephone Encounter (Signed)
Called and spoke with pts son mike and he stated that pt has been taking the lisinopril and wanted to make sure she was to stay on this medication and is aware that this med has been sent to her pharmacy for refills --they were told to keep on the same meds at last ov.  They were also told to start on the aldactone 25 mg 1/2 tab daily, but he stated that she has not bee taking this medcication and cardiology started the pt back on lasix 40 mg daily PRN yesterday, so mike wanted to make sure that the pt was not to be on the aldactone as well.  [name] also stated that pt saw Amy NP yesterday and was told that on her CT scan from the hospital stay and from 2012 that it showed something on her pancreas and he is very concerned about this and wanted SN to review these scans and let him know if this is something of concern and was worried since they have never been told anything about this before.  SN please advise. Thanks  Allergies  Allergen Reactions  . Sulfonamide Derivatives Swelling

## 2011-11-10 NOTE — Progress Notes (Signed)
Subjective:    Patient ID: Katie Galvan, female    DOB: Nov 08, 1927, 78 y.o.   MRN: [id number]  HPI Katie Galvan is a very pleasant 78 year old white female, primary patient of Dr. [name], and known to GI only from prior endoscopy done while she was hospitalized a year ago by Dr. Juanda Chance with finding of a mild gastritis . Patient has multiple serious comorbidities; and was just hospitalized 1028 through 11/03/2011 with lower Donald pain secondary to diverticulitis. We did not see her during this admission and she was managed by the hospitalists. She had CT scan of the abdomen and pelvis done nontender 29 2013 which showed a sigmoid diverticulitis/colitis, as well as a 3.7 x 1.4 centimeter hypodense mass in the uncinate process of the pancreas which may represent a cystic neoplasm, also a nonobstructing 5 mm stone at the lower pole of the left kidney. She was seen by cardiology while she was in the hospital for congestive heart failure, and is currently wearing a Holter monitor, and is to followup with Dr. Clarise Cruz  re guarding possible pacemaker. She has O2-dependent COPD, GERD, history of endocarditis, restrictive lung disease and a cardiomyopathy. Last echo however did show an ejection fraction of 65-70%. She is maintained on chronic narcotics for chronic musculoskeletal pain . From the diverticulitis standpoint she was given a one-week course of Cipro and Flagyl at the time of discharge and is completing that today she says she is still having some lower abdominal pain but feels probably 75% better than she did when hospitalized She is eating, has no complaints of nausea or vomiting, no diarrhea, no melena or hematochezia. Last labs were done November 6  liver function studies were normal lipase is also normal, WBC still elevated at 14.8, hemoglobin 9.3 ,hematocrit of 29.2, last BNP was done on November 1 and was elevated at 1925    Review of Systems  Constitutional: Positive for fatigue.  HENT:  Negative.   Eyes: Negative.   Respiratory: Positive for shortness of breath.   Cardiovascular: Positive for leg swelling.  Gastrointestinal: Positive for abdominal pain.  Genitourinary: Positive for frequency.  Musculoskeletal: Positive for back pain and arthralgias.  Neurological: Positive for weakness.  Hematological: Negative.   Psychiatric/Behavioral: Negative.    Outpatient Prescriptions Prior to Visit  Medication Sig Dispense Refill  . aspirin 81 MG EC tablet Take 81 mg by mouth daily.        . Calcium Carbonate-Vit D-Min (CALTRATE 600+D PLUS) 600-400 MG-UNIT per tablet Chew 1 tablet by mouth 2 (two) times daily.       . carvedilol (COREG) 6.25 MG tablet Take 0.5 tablets (3.125 mg total) by mouth 2 (two) times daily with a meal.  60 tablet  11  . cholecalciferol (VITAMIN D) 1000 UNITS tablet Take 2,000 Units by mouth daily.      . citalopram (CELEXA) 20 MG tablet Take 1 tablet (20 mg total) by mouth daily.  30 tablet  11  . clotrimazole-betamethasone (LOTRISONE) cream Apply 1 application topically 2 (two) times daily as needed. For rashes on forehead.      . ferrous sulfate 325 (65 FE) MG tablet Take 325 mg by mouth 2 (two) times daily.        . Fluticasone-Salmeterol (ADVAIR DISKUS) 100-50 MCG/DOSE AEPB Inhale 1 puff into the lungs every 12 (twelve) hours.  60 each  11  . furosemide (LASIX) 40 MG tablet Take 1 tablet (40 mg total) by mouth daily as needed. Take 1 pill as needed  for weight over 143 or greater as well as leg swelling.  30 tablet  0  . lisinopril (PRINIVIL,ZESTRIL) 5 MG tablet Take 5 mg by mouth daily.      Marland Kitchen loratadine (CLARITIN) 10 MG tablet Take 10 mg by mouth daily.        . Multiple Vitamin (MULTIVITAMIN WITH MINERALS) TABS Take 1 tablet by mouth daily.      . Naproxen Sodium (ALEVE) 220 MG CAPS Take 1 capsule by mouth 2 (two) times daily.      . OxyCODONE (OXYCONTIN) 10 mg TB12 Take 1 tablet (10 mg total) by mouth every 12 (twelve) hours.  40 tablet  0  .  polyethylene glycol (MIRALAX / GLYCOLAX) packet Take 17 g by mouth daily. Do not take if you have diarrhea  14 each  0  . tiotropium (SPIRIVA) 18 MCG inhalation capsule Place 1 capsule (18 mcg total) into inhaler and inhale daily.  30 capsule  11  . vitamin C (ASCORBIC ACID) 500 MG tablet Take 500 mg by mouth daily.        Marland Kitchen zolpidem (AMBIEN) 10 MG tablet Take 5 mg by mouth at bedtime as needed. For insomnia      . ciprofloxacin (CIPRO) 500 MG tablet Take 1 tablet (500 mg total) by mouth 2 (two) times daily.  14 tablet  0  . metroNIDAZOLE (FLAGYL) 500 MG tablet Take 1 tablet (500 mg total) by mouth 3 (three) times daily. Take through November 7.  21 tablet  0   Allergies  Allergen Reactions  . Sulfonamide Derivatives Swelling   Patient Active Problem List  Diagnosis  . COLONIC POLYPS  . HYPERCHOLESTEROLEMIA  . ANEMIA, MILD  . DEPRESSION  . Chronic pain syndrome  . HYPERTENSION  . VALVULAR HEART DISEASE  . VENOUS INSUFFICIENCY  . DIAPHRAGMATIC DISORDER  . DIVERTICULOSIS OF COLON  . DEGENERATIVE JOINT DISEASE  . BACK PAIN, LUMBAR  . OSTEOPENIA  . LEG EDEMA, CHRONIC  . OTHER DYSPHAGIA  . Acute-on-chronic respiratory failure  . Dyspnea  . Colitis - presumed infectious origin  . Acute myopericarditis  . Heart failure, systolic and diastolic, chronic  . Altered mental status  . UTI (lower urinary tract infection)  . Syncope  . Sinus bradycardia  . Chronic respiratory failure with hypoxia  . Diastolic CHF, chronic  . Orthostatic hypotension  . Diverticulitis of sigmoid colon  . Pancreatic mass  . Anemia  . Acute on chronic diastolic heart failure  . Diverticulitis   History  Substance Use Topics  . Smoking status: Never Smoker   . Smokeless tobacco: Never Used  . Alcohol Use: No       Objective:   Physical Exam well-developed chronically ill-appearing frail elderly white female using a rolling walker, and on chronic oxygen  Accompanied by her daughter-in-law. Blood  pressure 122/80 pulse 66 weight 149. HEENT; nontraumatic normocephalic EOMI PERRLA sclera anicteric conjunctiva are somewhat pale, Neck; supple no JVD, Cardiovascular;  regular rate and rhythm with S1 and S2 soft systolic murmur, Pulmonary; somewhat decreased breath sounds bilateral bases, Abdomen; soft nontender nondistended bowel sounds are active there is no palpable mass or hepatosplenomegaly, Rectal; exam not done, Extremities; trace edema in the ankles she has a wound on her left shin. Psych; mood and affect normal and appropriate        Assessment & Plan:  #65 78 year old white female seen in followup post hospital for acute sigmoid diverticulitis. She is improved, but has not completely resolved. #2  incidental finding of a pancreatic uncinate process hypodense  mass on CT scan 10/31/2011. Neither the patient or her family were told of this finding at the time of her hospital stay Rule out cystic neoplasm  #3 congestive heart failure #4 O2 dependent COPD #5 chronic narcotic dependence for chronic back pain #6 history of myopericarditis #7 normocytic anemia #8 recurrent syncope-etiology not clear patient currently undergoing Holter monitoring in may require pacemaker.  Plan; patient will need 7 more days of Cipro 500 twice a day and Flagyl 500 mg by mouth twice a day. They are asked to call back if her symptoms have not completely resolved when she finishes the antibiotics. I had  long discussion with patient and her daughter-in-law regarding the pancreatic mass. Initial workup would likely involve an endoscopic ultrasound and biopsy which would require sedation. Given her multitude of other issues she does not appear fit to undergo this procedure currently . Patient states she is not sure that she would pursue any treatment even if she did have a pancreatic tumor, and really does not seem to be having any symptoms related to this lesion currently. She will plan to recover from the  diverticulitis, and complete her Holter monitoring in cardiac followup regarding need for pacemaker then we can decide about endoscopic ultrasound. Will check CEA and CA 19-9 today Have scheduled her a followup appointment with Dr. Christella Hartigan in about 3 weeks to further discuss

## 2011-11-10 NOTE — Telephone Encounter (Signed)
I talked to son, Charlotterose Nissen, & got him up to date on the [facility] findings, Cardiac, & GI evaluations...  Hosp/ GI>> diverticulitis treated w/ Cipro/ flagyl; saw Amy today & pt improved, not resolved & given another week Cipro/ Flagyl...  Also has cystic mass in pancreas seen on CTscans & she outlined GI work up w/ poss endoscopic ultrasound & biopsy if she is fit enough for the procedure; she has f/u appt in 3 weeks w/ drJacobs...  Cards> she is followed by DrBensimon/ CHF clinic & currently on Holter monitor... meds adjusted in hosp & by cards- currently taking ASA81, Coreg3.125Bid, Lasix40 PRN only, Lisinopril5...  Hosp also changed her MScontin15mg Bid to Oxycontin10mg Bid.Marland KitchenMarland Kitchen

## 2011-11-12 NOTE — Assessment & Plan Note (Signed)
Volume status increasing.  Will change lasix administration to 40 mg daily for a weight of 143 pounds or more.  Have discussed with her son and daughter in law.

## 2011-11-12 NOTE — Assessment & Plan Note (Signed)
No further syncopal episodes.  She will pick up 2 week event monitor today.  Will review at follow up in 1 month.

## 2011-11-12 NOTE — Assessment & Plan Note (Signed)
Continues to complain of nausea and abdominal tenderness therefore have moved her GI follow up from the 19th to the 8th.

## 2011-11-14 ENCOUNTER — Encounter: Payer: Self-pay | Admitting: *Deleted

## 2011-11-15 ENCOUNTER — Ambulatory Visit (INDEPENDENT_AMBULATORY_CARE_PROVIDER_SITE_OTHER)
Admission: RE | Admit: 2011-11-15 | Discharge: 2011-11-15 | Disposition: A | Discharge status: Home / Self Care | Payer: Medicare Other | Source: Ambulatory Visit | Attending: Pulmonary Disease | Admitting: Pulmonary Disease

## 2011-11-15 ENCOUNTER — Other Ambulatory Visit: Payer: Medicare Other

## 2011-11-15 ENCOUNTER — Ambulatory Visit (INDEPENDENT_AMBULATORY_CARE_PROVIDER_SITE_OTHER): Payer: Medicare Other | Admitting: Pulmonary Disease

## 2011-11-15 ENCOUNTER — Encounter: Payer: Self-pay | Admitting: Pulmonary Disease

## 2011-11-15 VITALS — BP 114/82 | HR 89 | Temp 96.8°F | Ht 64.0 in | Wt 146.7 lb

## 2011-11-15 DIAGNOSIS — R1030 Lower abdominal pain, unspecified: Secondary | ICD-10-CM

## 2011-11-15 DIAGNOSIS — K5732 Diverticulitis of large intestine without perforation or abscess without bleeding: Secondary | ICD-10-CM

## 2011-11-15 DIAGNOSIS — G894 Chronic pain syndrome: Secondary | ICD-10-CM

## 2011-11-15 DIAGNOSIS — I872 Venous insufficiency (chronic) (peripheral): Secondary | ICD-10-CM

## 2011-11-15 DIAGNOSIS — D649 Anemia, unspecified: Secondary | ICD-10-CM

## 2011-11-15 DIAGNOSIS — I10 Essential (primary) hypertension: Secondary | ICD-10-CM

## 2011-11-15 DIAGNOSIS — I38 Endocarditis, valve unspecified: Secondary | ICD-10-CM

## 2011-11-15 DIAGNOSIS — J9611 Chronic respiratory failure with hypoxia: Secondary | ICD-10-CM

## 2011-11-15 DIAGNOSIS — R0902 Hypoxemia: Secondary | ICD-10-CM

## 2011-11-15 DIAGNOSIS — M1990 Unspecified osteoarthritis, unspecified site: Secondary | ICD-10-CM

## 2011-11-15 DIAGNOSIS — I5033 Acute on chronic diastolic (congestive) heart failure: Secondary | ICD-10-CM

## 2011-11-15 DIAGNOSIS — J9610 Chronic respiratory failure, unspecified whether with hypoxia or hypercapnia: Secondary | ICD-10-CM

## 2011-11-15 DIAGNOSIS — K5792 Diverticulitis of intestine, part unspecified, without perforation or abscess without bleeding: Secondary | ICD-10-CM

## 2011-11-15 DIAGNOSIS — R609 Edema, unspecified: Secondary | ICD-10-CM

## 2011-11-15 DIAGNOSIS — M899 Disorder of bone, unspecified: Secondary | ICD-10-CM

## 2011-11-15 DIAGNOSIS — M545 Low back pain: Secondary | ICD-10-CM

## 2011-11-15 DIAGNOSIS — F329 Major depressive disorder, single episode, unspecified: Secondary | ICD-10-CM

## 2011-11-15 NOTE — Progress Notes (Signed)
Subjective:     Patient ID: Katie Galvan, female   DOB: 06/18/1927, 78 y.o.   MRN: [id number]  HPI  Review of Systems    Physical Exam        Subjective:    Patient ID: Katie Galvan, female    DOB: 1927-09-07, 79 y.o.   MRN: [id number]  HPI: 58 y/o WF, mother of Lasha Gradwell, who moved here from [place] in 2011...  she has multiple medical problems including chronically elev right hemidiaph;  HBP;  Diastolic dysfunction & mild PulmHTN on 2DEcho;  VI & Edema;  Hx Dyspagia & gastritis;  Divertics & ?colon polyp;  Hx UTI & renal failure from dehydration 3/12;  Severe DJD- s/p R.THR/ ?CPPD/ LBP/ Chr Pain Syndrome on MS Contin;  Osteopenia;  Anemia...  ~  March 24, 2010:  She was hosp 3/11 - 03/17/10 w/ UTI, dehydration & renal insuffic> cults were neg & PCT=0.22, but active sediment & given IV Rocephin in hosp;  BUN 118 =>15 & Creat 2.7 =>0.72 w/ hydration but BNP incr to 636;  Hg 10.0 =>9.1 & Fe=11 (7%sat), B12=478;  EGD by DrDBrodie showed HH & gastritis w/ neg HPylori, Rx w/ Prilosec & Carafate transiently;  Currently she feels better & looks great> right hip surg postponed & anxious to resched due to her severe pain (on MS 30+15 Bid & still in pain, wants to add back Mobic-OK);  We wanted to check labs today but they couldn't get blood ==> reviewed all her meds, add FeSO4/ VitC, add Mobic Prn, OK for surg (check labs in hosp preop)...    HBP>  On ASA & Diltiazem 240mg  bid;  BP= 122/66 today & she denies CP, palpit, ch in SOB;  She does have incr edema since hydrated in hosp & BNP was up to 636 by disch (we discussed no salt, contin Lasix40mg /d);      Cardiac>  EKG essent WNL & 2DEcho showed mild LVH, norm sys funct w/ EF=55-65%, gr1 DD, mild MR w/ calcif annulus, Ao sclerosis w/ triv AI, mod PulmHTN w/ PAsys=46.  ~  June 30, 2010:  34mo ROV & post hosp visit>  She was hosp x2 since I saw her last >>    Hosp 4/6 - 04/18/10 by DrBlackman for right THR & required a revision  of the acetabular component 4d after the initial surg...     Hosp 6/2 - 06/13/10 w/ hypoxemic acute resp failure precipitated by fluid overload & diastolic CHF (BNP=2000) in assoc w/ her chr elev right hemidiaph, RLL atx/ scarring, & scoliosis w/ multilevel degen disc dis(w/ resultant restrictive lung disease); she was also Anemic (Hg= 9-10) w/ low Fe (=10, 6%sat); and also remains on large dose narcotic analgesics for her chr pain syndrome>  Disch back to [place] [facility] on meds as reconciled below...     She is currently getting PT & walking w/ a walker, going about 200 ft w/ her nasal O2 at 2L/min w/ some DOE but she has been very sedentary for a long time;  She denies CP, palpit, syncope, but has some mild edema on Lasix 40mg /d; We rechecked her O2 sat= 83% RA at rest & she will need to stay on the O2 for now;  Her narcotic pain med hasn't been weaned any further since her hip surg in April> on MSContin 30+15 Bid + Percocet 5mg  prn...    LABS today>  CXR stable (elev right hemidiaph, atx, NAD);  Chems normal (Creat 0.8, BNP 95);  CBC w/ anemia (Hg 9.1, MCV 77, Fe 15), she has B pos blood> NOTE she had EGD 3/12 showing HH, gastritis, neg HPylori...    We decided to keep Lasix 40mg /d, restrict sodium etc;  Increase PPI to Bid, incr Fe w/ VitC Bid, check stool for occult blood, consider IV Fe if not improving orally & consider further GI eval...  ~  July 21, 2010:  3wk ROV & last visit we decided to keep LASIX 40mg /d; try to cut the MSContin from 45mg Bid to 45mg AM & 30mg PM, incr the Fe to Bid (w/ VitC), and incr the Prilosec 20mg Bid...  She reports stable & is now back home w/ family (out of skilled care & not yet ready to be on her own in [place])> they have weaned the MSContin down to 30mg AM & 30mg PM & doing satis (no Oxycodone needed);  Labs shows improved Hg= 10.0 now;  O2 sats improved> 94% RA at rest & decr to 93% on RA walking in the room;  Her edema is gone & BUN=25, Creat=0.8;  Getting home PT 3d per week;   Also BS improved to the 120-130 range...    We decided to continue current meds but decr the Diltiazem to 240mg  once daily (due to BP sl low at 102/54); and they will continue to slowly wean the MSContin...  ~  November 02, 2010:  Post Hosp visit> she was hosp 10/7 - 10/19/10 w/ abd pain/ N/ V/ D & found to have ?colitis ?presumed infectious & resolved w/ Cipro/ Flagyl; also had CP & marked incr enzymes w/ +MB & Troponin=21; EKG reported to show diffuse STseg elev (no tracings in EChart to review); no rub on exam, felt to have myopericarditis; 2DEcho showed LV cavity dilated w/ septal apical anterior HK, & incr wall thickness c/w modLVH & mild conc hypertrophy; mildMR, normal RV;  She also developed fluid overload (off her diuretic & receiving IVF w/ the colitis) w/ BNP up to 10,000 & then diuresed w/ improvement; her prev meds were changed> Diltiazem & Vasotec stopped, given Lisinopril 2.5mg , & Lasix decr to 20mg /d...    Since disch she has retained fluid w/ 3+edema in LEs & gained>5lbs on the Lasix20 in the NH;  BP= 116/68;  We rechecked studies today:  CXR shows marked elev right hemidiaph & appears similar to her baseline film 6/12;  BNP=483;  Chems= normal;  Hg=11.0, WBC=8.2, TSH=5.77 but was norm prev (?euthyroid "sick" syndrome);  We discussed incr LASIX to 40mg /d, elevate legs, ?try support hose, & f/u in 4-6 weeks... She has appt w/ Cards tomorrow.  ~  December 07, 2010:  6wk ROV & she has seen DrBensimhon at the CHF clinic in the interim (3 weekly visits w/ the PAs)> he endorsed her Lasix at 40mg /d and added Spironolactone 12.5mg /d as well (stopping the KCl); she is improved & wt is down 14# from last visit to 139# today;  He increased her Coreg to 6.25Bid & plans short term follow up w/ repeat 2DEcho...  She tells me she wants to stop the Oxygen & use it only at night> we ambulated her in the halls today on RA- started out 90% RA at rest (HR70), desat to 86% after 2 laps (HR96 in bigeminy); she is  asked to continue the O2 w/ exercise & Qhs, ok to take it off at rest when not active...  She is still on the MSContin at 30mg Bid & not requiring any of the OxyIR> we discussed pushing her down to 30-15,  the 15Bid if poss over the next interval (meds regulated by her son [name])...  Labs today look great w/ K=3.6, TCO2=33, BUN=21, Creat=0.9, BNP=119 (her best yet).  ~  February 21, 2011:  8mo ROV & Zakyrah is stable> she saw DrBensimhon 12/12 & doing well on Lasix40, Aldactone12.5, & Coreg 6.25Bid; he repeated her 2DEcho & her LVF had recovered to 55-60% (LV cavity mildly dil, mild LVH, mild MR, PAsys=48mmHg); he felt her myopericarditis had resolved & he released her from CHF clinic follow up...    Hx Resp Fail> chr elev right hemidiaph, on O2 w/ exerc & Qhs, Advair100 Bid & Spiriva daily; stable & rec to continue same plus incr exerc as able...    HBP> controlled on the Coreg, diuretics, & Lisinopril 2.5mg /d (off prev CCB); BP= 148/88 & she is reminded to elim salt, etc; wt is up 3# to 142# & we will monitor...    Chol> with the mult hospitalizations & rehab stays w/ numerous med reconciliations done- she has stopped her prev Mevacor20; we discussed checking f/u FLP off the statin in the future...    ORTHO> she has persistent discomfort in her hip, right shoulder, back & the chr pain syndrome currently on MSContin 30mg AM & 15mg PM; we discussed trial of decr to 15mg Bid betw now 7 f/u in 3 months...    GYN> she mentioned some vag spotting & apparently has a pessary in place since her last check in [place]; we will refer to GYN for eval...  ~  May 04, 2011:  67mo ROV & add-on for recent incr SOB; she reports fall recently w/ feet swelling now & noted incr SOB; she is on her Oxygen at 1-2L/min, and had f/u Cards 3/13 for her sys & diast heart failure but they kept her diuretics the same> on Lasix 40mg /d but she is instructed to incr to 2/d if she has extra fluid/ edema/ etc...    She also had GYN appt DrFernandez  2/13 for post menopausal bleeding, pessary for uterine prolapse x yrs (it was changed regularly in [place] but not in >23yr); DrF said her vag walls & Cx were irritated & friable, he removed pessary & Rx w/ estrace cream, she was to ret in 10d but didn't go (pessary is still out & she has not experienced any descensus so far); she will sched GYN follow up at her convenience... CXR 5/13 showed marked eventration of right hemidiaph (no change), mild cardiomeg, atherosclerotic calcif of tortuous Ao, sl congestion & Atx, etc... LABS 5/13:  Chems- ok w/ BS=125 BUN=14 Creat=0.8;  CBC- ok w/ Hg=12.3;  TSH=1.71;  BNP= 368...  ~  May 23, 2011:  3wk ROV & [place] is improved- breathing better, less SOB, no chest discomfort, and ?swelling down; she notes better appetite & wt is actually up 4#; we discussed continuing the Lasix 40mg  each AM but she may incr to 80mg  on any day that swelling doesn't go down overnight; rec regular exercise etc & she is getting PT at [facility]...  ~  September 06, 2011:  67mo ROV & post hosp check>     She states it all started w/ "falls"- went to ER 07/30/11 after having been found on the floor at [facility], didn't know what happened, no apparent injury etc, ?some speech prob, no focal neuro deficits;  Labs- Hg=11.7, WBC=13.5, BUN/Cr= 30/1.3;  Urine C&S= neg;  CT Head> atrophy & sm vessel dis, no acute infarct or hem;  CXR> chr changes & elev right hemidiaph,  NAD;  EKG> SBrady, rate58, NAD;  She was ret to the NH improved...    She was Adm to [facility] 7/31 - 08/07/11 after another "fall" & by report they were occuring at night when trying to get OOB- syncopal & awakes on the floor, no recollection of the fall etc; no major trauma, no incont, not post ictal, she was mildly bradycardic on Coreg6.25Bid, no signif arrhythmia noted, felt to be prob orthostatic vs vagal;  Labs- Hg=10-11.5, WBC=10-12K, BUN/Cr= 35-21/1.03-0.86, BNP=1420, Urine+Ecoli sens Cipro;  CDopplers> tortuous vessels, no signif  stenosis, vertebrals patent w/ antegrade flow;  EKG> SBrady, rate53, NSSTTWA;  2DEcho> mild LVH, norm LVF w/ EF=65-70%, Gr1DD, calcif mitral annulus, mildly thick leaflets, mild MR, mild LA dil, mild calcif AoV leaftlets, no AS/AI;  She was disch to SNF at [facility] & meds adjusted==>They reduced Coreg3.125Bid, reduced MSContin15Bid, reduced Ambien5prn...    In the NH they f/u labs and found BUN=55, Creat=1.13 and Lasix, Aldactone, Lisinopril HELD; subseq Labs improved & restarted Aldactone25-1/2tab & Lisinopril5mg /d; final Labs 08/31/11 showed BUN=27, Creat=0.94...    Since ret to her apt she is improved- not dizzy, not postural, no recurrent falls or syncpe; Pulm & Cardiac ROS is neg & she is ambulating w/ walker, still has hip pain & c/o new right shoulder pain w/ decr ROM (may have hit it during one of her prev falls); [name] will call DrBlackman for Ortho eval;  They also mentioned decr hearing (she says OK) & I encouraged them to get Audiology eval at ENT office DrWolicki...  ~  October 09, 2011:  1037mo ROV & son reports that she's continued to be weak & has lost 9# to 149# today; they feel sl better since she has cut Aldactone25 in half & stopped the Lasix40- now just using it 'prn" & none recently; we discussed need for incr exercise, daily physical activity, & she has f/u w/ her orthopedist soon...    We reviewed prob list, meds, xrays and labs> see below for updates >> she has already had the 2013 Flu vaccine...  ~  November 15, 2011:  1037mo ROV & post hosp visit>  Hosp again 10/29- 11/03/11 this time w/ diverticulitis- presented w/ LLQ pain x1d, WBC elev at 18K, CT showed sigmoid diverticulitis (wall thickening & inflamm changes); treated w/ Cipro/ Flagyl, slowly advanced diet, etc; NOTE> CT Abd also revealed hypodense mass in pancreas at the uncinate process measuring 3.7 x 1.4cm c/w cystic neoplasm; she saw Amy in GI post hosp> and was 755 improved, they extended the cipro/ flagyl for 7d more & set her up  to see DrJacobs for poss endoscopic ultrasound & bx (Note> CEA=2.7,  Ca19-9= 7.2 w/norm<35)...    She has continued close f/u in CHF clinic w/ DrBensimhon's PAs> seen 11/09/11 & they adjusted Lasix for her vol status to 40mg /d if wt>143#; and they plan 2 wk holter monitor...     She is still feeling miserable- exhauster & even too tired to shower etc; daughter-in-law wonders about depression & we offered low dose medication to see if this helps but ultimately she declined new meds and we will observe    We reviewed prob list, meds, xrays and labs> see below for updates >>    HOSPITALIZATIONS: ~  [facility] 11/3 - 11/08/09 after fall at home w/ comminuted left wrist fx & had surg by DrKuzma ~  [facility] 3/11 - 03/17/10 w/ UTI, dehydration & renal insuffic> back to norm w/ hydration; also anemic, Fe defic & GI  eval DrDBrodie w/ HH/ gastritis ~  [facility] - 04/18/10 by DrBlackman for right THR & required a revision of the acetabular component 4d after the initial surg... ~  [facility] 6/2 - 06/13/10 w/ hypoxemic acute resp failure precipitated by fluid overload & diastolic CHF (BNP=2000) in assoc w/ her chr elev right hemidiaph, RLL atx/ scarring, & scoliosis w/ multilevel degen disc dis(w/ resultant restrictive lung disease)... ~  [facility] 10/7 - 10/19/10 w/ abd pain/ N/ V/ D & found to have ?colitis ?presumed infectious & resolved w/ Cipro/ Flagyl; also had CP & abn EKG/ Enz felt to be a myopericarditis... ~  [facility] 7/31 - 08/07/11 w/ fall/syncope ?etiology- felt to be poss orthostatic vs vagal; Neuro & CV evals were unrevealing, meds adjusted & improved... ~  [facility] 10/29- 11/03/11 w/ diverticulitis- presented w/ LLQ pain x1d, WBC elev at 18K, CT showed sigmoid diverticulitis (wall thickening & inflamm changes); treated w/ Cipro/ Flagyl, slowly advanced diet, etc; GI extended the course of antibiotics for 1 more week; also has cystic pancreatic mass & referred to [facility] for EUS, pos bx.          Problem List:    ACUTE ON CHRONIC  RESP INSUFFICIENCY>  Precipitated 6/12 by diastolic CHF superimposed on her chronic resp problems etc... Now on ADVAIR100 Bid, SPIRIVA daily, O2 2L/ min... DIAPHRAGMATIC DISORDER (ICD-519.4) - she has a chronically elev right hemidiaph, apparently idiopathic; and she is mostly asymptomatic w/o cough, phlegm, hemoptysis, ch in SOB, etc... ~  11/11:  CXR showed calcif Ao, elev right hemidiaph, mild scarring at bases, NAD... ~  6/12:  Presented to ER w/ hypoxemic resp failure due to vol overload assoc w/ her restrictive lung dis & chr pain syndrome requiring narcotic analgesics... ~  7/12:  O2 sats improved & OK to cut back the Oxygen to prn... ~  10/12:  Hosp w/ ?colitis and ?myopericarditis> developed fluid retention, CHF, abn EKG/ Enz & 2DEcho> seen by [facility] ==> now followed in the CHF clinic/ DrBensimhon & back on Oxygen regularly ==> weaned to exerc & Qhs. ~  5/13:  She is feeling better & wonders if she needs the O2; offered to check ambulatory O2 here & poss ONO at [facility] but she wants to wait... ~  7/13:  CXR showed chr changes & elev right hemidiaph, NAD... ~  11/13:  CXR showed borderline heart size & calcif Ao, elev right hemidiaph, right base atx, NAD...  Hx HYPERTENSION (ICD-401.9) VALVULAR HEART DISEASE (ICD-424.90) > prev trivial AI, mild MR... Hx of CARDIAC ARRHYTHMIA (ICD-427.9)  EPISODE of MYOPERICARDITIS w/ decr LVF> see 10/12 Hosp... ~  she was on ASA81, Diltiazem240, Vasotec20, & LASIX 40mg /d; but meds changed during the 10/12 Hosp to ASA 81mg /d, LISINOPRIL 2.5mg /d, LASIX 20mg /d ==> freq med adjustments/ titrations from DrBensimhon CHF clinic... ~  11/11 & 6/12:  EKG showed NSR, NSSTTWA, NAD... ~  2DEcho 6/12 showed mild LVH, norm LVF w/ EF=55-60%, Gr 1 DD, trivial AI, heavily calcif mitral annulus & mild MR, PAsys est . ~  10/12:  SEE ABOVE + EChart records... Hosp w/ ?myopericarditis, elev enz, abn 2DEcho & meds adjusted> now followed freq via the CHF clinic/  DrBensimhon w/ freq med adjustments... ~  12/12:  Now much improved on COREG 6.25Bid, LISINOPRIL 2.5mg /d, LASIX 40mg /d, SPIRONOLACTONE 12.5mg /d; wt down to 139# & BNP= 119... ~  Repeat 2DEcho 12/12 showed LVF recovered to 55-60% (LV cavity mildly dil, mild LVH, mild MR, PAsys=103mmHg). ~  5/13: presents w/ incr  SOB & eval shows wt=149# & BNP= 368 despite Lasix40 & Aldactone12.5; BP is also elev at 178/88; rec to increase Lasix to 80 for a few days ==> she reports improved and back to baseline...  ~  EKG 7/13 showed SBrady, rate53, minor NSSTTWA, otherw ok... ~  8/13: she was [facility] w/ fall/syncope ?etiology> 2DEcho showed mild LVH, norm LVF w/ EF=65-70%, Gr1DD, calcif mitral annulus, mildly thick leaflets, mild MR, mild LA dil, mild calcif AoV leaftlets, no AS/AI; now on ASA81, COREG6.25-1/2Bid, LISINOPRIL5, ALDACTONE25-1/2daily... ~  CDopplers 8/13 showed TDS due to anatomy & tortuosity- no signif extracranial carotid dis, vertebrals are patent & antegrade... ~  10/13:  BP= 110/80 & they are reminded to bring up-to-date list of all meds to every visit...  VENOUS INSUFFICIENCY (ICD-459.81) LEG EDEMA, CHRONIC (ICD-782.3) - she has severe venous stasis changes and chronic edema, thickened skin over LE's etc... she knows to be careful w/ hygiene, no salt, elevation, support hose, etc ~  8/12:  Stable on Lasix40, 1+chr edema, wt=137# ~  10/12:  Post hosp check on Lasix20, 3+edema, BNP=483, wt=152#; rec to incr Lasix to 40mg /d... ~  12/12:  meds adjusted as above via CHF clinic & now K=3.6, TCO2=33, BUN=21, Creat=0.9, BNP=119 (her best yet)... ~  5/13:  We discussed adjusting Lasix to 40mg  daily & 80mg  prn for swelling that doesn't go down overnight... ~  8/13:  Post hosp meds adjusted & off Lasix... ~  11/13:  Cards adjusted Lasix to 40mg  prn weight >143#  HYPERCHOLESTEROLEMIA (ICD-272.0) - she has been on Mevacor 20mg /d from her [place] physician... ~  Perhaps we can try to get an FLP at [facility]  since we cannot get her in for Fasting labs. ~  FLP during the 10/12 hosp showed TChol 146, TG 98, HDL 53, LDL 73 ~  Due to numerous [place], Rehab stays in [place], & mult med reconciliations- her Mevacor was dropped along the way; wants to leave it off & check FLP on diet alone.  OTHER DYSPHAGIA (ICD-787.29) - she was on PRILOSEC 20mg /d & reports occas dysphagia but denies choking etc... she apparently has never had an Endoscopy or GI eval for this problem, "I have to be careful when I eat"... ~  6/12:  NOTE she had EGD 3/12 showing HH, gastritis, neg HPylori> rec increase PPI to Bid. ~  10/12:  She was discharge off her PPI therapy ==> she denies reflux symptoms. ~  8/13:  She states stable w/o abd pain, n/v, dysphagia, etc...  DIVERTICULOSIS OF COLON (ICD-562.10) Hx of COLONIC POLYPS (ICD-211.3) - she tells me that prev colonoscopy in [place] showed divertics & ?polyp but she does not recall any details...  she denies much constipation despite the narcotic analgesics- uses prunes as needed. ~  [facility] 10/12 w/ ?infectious colitis> resolved after Cipro/ Flagyl Rx ==> uses Miralax prn due to narcotics.  DEGENERATIVE JOINT DISEASE (ICD-715.90) - she has severe osteoarthritis w/ XRays 11/11 showing severe osteopenia, severe right hip degeneration & relative sparing of the left hip joint;  DJD knees w/ ?CPPD, abn patella;  left wrist fx- radial  head & ulnar styloid... she had surg left wrist by DrKKuzma... ~  4/12:  DrBlackman did right THR but required revision of acetabular component 4d later> went to [facility] for rehab... ~  DrBlackman continues to follow w/ shots as needed for bursitis she says... ~  10/12:  She is c/o right shoulder pain & decr ROM, she wonders if this might be the  cause of her chest discomfort; she will f/u w/ Ortho for XRays & shot... ~  8/13:  She is again c/o right shoulder pain & wonders if she injured it during one of her falls; she will f/u w/ DrBlackman for eval...  GYN >>    ~  2/13: pt mentioned some spotting & apparently has a pessary in place since her days in [place]; she is in need of GYN eval & check up & we will refer... ~  2/13: she saw DrFernandez for GYN> said her vag walls & Cx were irritated & friable, he removed pessary & Rx w/ estrace cream, she was to ret in 10d but didn't go (pessary is still out & she has not experienced any descensus so far); she will sched GYN follow up at her convenience...  Hx of BACK PAIN, LUMBAR (ICD-724.2) CHRONIC PAIN SYNDROME (ICD-338.4) - she has chronic pain- mostly from her arthritic condition- and was started on MSContin by her LMD in [place] in the summer of 2011> dose slowly increased & she was on 90mg  Bid (taking a 60mg  tab + 30mg  tab Bid)... she does not believe that this med has anything to do w/ her fall & wrist fx, or her complaints of decr memory etc... we discussed the need to wean off this medication & try to deal w/ her arthritis pain thru an Orthopedist or poss a Pain Clinic... ~  11/11:  she notes pain worse during the day while up & about> try to decr MSContin to 90mg AM & 60mg PM... ~  1/12 & 4/12:  she is down to 60mg Bid & we discussed weaning further ==> she was down to 45mg  (30+15) Bid by the time of her right THR... ~  6/12:  Recommend that she try to wean further> try 45am & 30pm... ~  7/12:  She's down to 30mg  Bid & will wean further as able... ~  10/12:  She remains on the MSContin at 30mg Bid... ~  12/12:  We discussed further slow wean down to 30-15 first... ~  2/13:  We discussed trying to wean down to 15mg  Bid over the next few months, but she was unable to do so; states that DrBlackman feels it's due to her back... ~  8/13:  She is down to MSContin 15mg Bid since her recent hosp... ~  11/13:  Hospitalists changed her MSContin to OXYCODONE & she was discharged on this...  OSTEOPENIA (ICD-733.90) - XRays here showed severe osteopenia... she notes that she used to be on Fosamax but ?how long? & when  stopped?... she states that she was told her prev BMD was "good"... she has not been taking calcium supplements, but was prev on some OTC Vit D supplements... asked to resume Calcium, Women's MVI, Vit D 1000u daily...  Hx of DEPRESSION (ICD-311) - on CELEXA 20mg /d and she wants to continue this med... INSOMNIA >> on AMBIEN 10mg - 1/2 tab as needed...  ANEMIA, MILD (ICD-285.9) - Hg post op 11/11 wrist fx surg = 11.2 ~  6/12:  Labs showed Hg= 9.1, MCV= 77, Fe= 15 (7%sat), & she has B pos blood> we decided to incr Fe to Bid (may need IV Fe if not responding). ~  7/12:  Labs showed Hg= 10.0, MCV= 76, she reports stool checks at [facility] were NEG... ~  8/12: she reports that she had f/u Heme eval DrMohammed & "he released me" on Fe + VitC daily... ~  10/12:  Labs post hosp showed Hg= 11.0, MCV= 87 ~  Labs 5/13 showed Hg= 12.3   Past Surgical History  Procedure Date  . Appendectomy   . Breast surgery   . Right wrist surgery   . Bilateral cataracts   . Hip surgery   . Tonsillectomy   . Adenoidectomy     Outpatient Encounter Prescriptions as of 11/15/2011  Medication Sig Dispense Refill  . aspirin 81 MG EC tablet Take 81 mg by mouth daily.        . Calcium Carbonate-Vit D-Min (CALTRATE 600+D PLUS) 600-400 MG-UNIT per tablet Chew 1 tablet by mouth 2 (two) times daily.       . carvedilol (COREG) 6.25 MG tablet Take 0.5 tablets (3.125 mg total) by mouth 2 (two) times daily with a meal.  60 tablet  11  . cholecalciferol (VITAMIN D) 1000 UNITS tablet Take 2,000 Units by mouth daily.      . ciprofloxacin (CIPRO) 500 MG tablet Take 1 tablet (500 mg total) by mouth 2 (two) times daily.  14 tablet  0  . citalopram (CELEXA) 20 MG tablet Take 1 tablet (20 mg total) by mouth daily.  30 tablet  11  . clotrimazole-betamethasone (LOTRISONE) cream Apply 1 application topically 2 (two) times daily as needed. For rashes on forehead.      . ferrous sulfate 325 (65 FE) MG tablet Take 325 mg by mouth 2 (two)  times daily.        . Fluticasone-Salmeterol (ADVAIR DISKUS) 100-50 MCG/DOSE AEPB Inhale 1 puff into the lungs every 12 (twelve) hours.  60 each  11  . furosemide (LASIX) 40 MG tablet Take 1 pill as needed for weight over 143 or greater as well as leg swelling.      Marland Kitchen lisinopril (PRINIVIL,ZESTRIL) 5 MG tablet Take 1 tablet (5 mg total) by mouth daily.  30 tablet  11  . loratadine (CLARITIN) 10 MG tablet Take 10 mg by mouth daily.        . metroNIDAZOLE (FLAGYL) 500 MG tablet Take 1 tablet (500 mg total) by mouth 2 (two) times daily before a meal. Take through November 7.  14 tablet  0  . Multiple Vitamin (MULTIVITAMIN WITH MINERALS) TABS Take 1 tablet by mouth daily.      . Naproxen Sodium (ALEVE) 220 MG CAPS Take 1 capsule by mouth 2 (two) times daily.      . OxyCODONE (OXYCONTIN) 10 mg TB12 Take 1 tablet (10 mg total) by mouth every 12 (twelve) hours.  40 tablet  0  . polyethylene glycol (MIRALAX / GLYCOLAX) packet Take 17 g by mouth daily. Do not take if you have diarrhea  14 each  0  . tiotropium (SPIRIVA) 18 MCG inhalation capsule Place 1 capsule (18 mcg total) into inhaler and inhale daily.  30 capsule  11  . vitamin C (ASCORBIC ACID) 500 MG tablet Take 500 mg by mouth daily.        Marland Kitchen zolpidem (AMBIEN) 10 MG tablet Take 5 mg by mouth at bedtime as needed. For insomnia      . [DISCONTINUED] furosemide (LASIX) 40 MG tablet Take 1 tablet (40 mg total) by mouth daily as needed. Take 1 pill as needed for weight over 143 or greater as well as leg swelling.  30 tablet  0    Allergies  Allergen Reactions  . Sulfonamide Derivatives Swelling    Current Medications, Allergies, Past Medical History, Past Surgical History, Family History, and Social History were reviewed in [employer] record.  Review of Systems: Constitutional:  Denies F/C/S, anorexia, unexpected weight change. HEENT:  No HA, visual changes, earache, nasal symptoms, sore throat, hoarseness. Resp:  No  cough, sputum, hemoptysis; mild SOB/ DOE noted... Cardio:  No CP, palpit, orthopnea;  +edema & DOE but limited mobility. GI:  Denies N/V/D, tends toward constip due to narcotics; swallowing OK & denies reflux or abd pain.  GU:  No dysuria, freq, urgency, hematuria, or flank pain. MS:  Severe DJD esp right hip w/ pain & decr ROM ==> s/p right THR now... Neuro:  No tremors, seizures, dizziness, syncope; +weakness & multifactorial gait abn. Skin:  No suspicious lesions or skin rash. Heme:  No adenopathy, bruising, bleeding. Psyche: Denies confusion, sleep disturbance, hallucinations; +anxiety & situational depression.    Objective:  Physical Exam:  WD, WN, 78 y/o WF chr ill appearing but in NAD... Vital Signs:  Reviewed... General:  Alert & oriented; pleasant & cooperative... HEENT:  [place]/AT, EOM-wnl, PERRLA, EACs-clear, TMs-wnl, NOSE-clear, THROAT-clear & wnl. Neck:  Supple w/ decr ROM; no JVD; normal carotid impulses w/o bruits; no thyromegaly or nodules palpated; no lymphadenopathy. Chest:  Clear to P & A; without wheezes/ rales/ or rhonchi heard... Heart:  Regular Rhythm; norm S1 & S2 without murmurs/ rubs/ or gallops detected... Abdomen:  Soft & nontender; normal bowel sounds; no organomegaly or masses palpated... Ext:  decrROM; +deformities & mod arthritic changes; no varicose veins, +venous insuffic & 1+edema;  Pulses intact w/o bruits... s/p right THR w/ revision; ambulates w/ walker... Neuro:  CNs intact;  No focal neuro deficits, in wheelchair & painful standing & walking... Derm:  No lesions noted; no rash etc... Lymph:  No cervical, supraclavicular, axillary, or inguinal adenopathy palpated...   RADIOLOGY DATA:  Reviewed in the EPIC EMR & discussed w/ the patient...  LABORATORY DATA:  Reviewed in the EPIC EMR & discussed w/ the patient...   Assessment & Plan:    Acute on Chr Resp Failure>  Multifactorial w/ diastolic CHF, elev right hemidiaph, atx, scoliosis, restrictive  lung dis, narcotic pain meds- all playing a role... O2 sats are now improved & she can decr the oxygen to as needed at rest & 2L/min w/ exercise...  Elev right hemidiaph>  This is chronic & assoc w/ some mild basilar atx; encouraged to get deep breaths & expand well...  HBP, etc>  She was felt to have orthostatic syncope & meds adjusted down- now off Lasix (using it just prn wt>143#), off Aldactone, on Coreg 3.125Bid & Lisinopril 5mg /d...  Hx of Myopericarditis w/ abn EKG/ Enz & subseq vol overload w/ abn 2DEcho>  She improved back on her diuretic & meds were adjusted in the CHF clinic; repeat 2DEcho 12/12 w/ recovery of LVF & back to her baseline... F/u 2DEcho 8/13= improved w/ EF=65-70%, Gr1DD...  Diastolic CHF>  She is now off the Lasix due to Adm w/ syncope, but subseq restarted prn if wt>143#  VI, Edema>  REC- no salt, elevate legs, TED hose, etc...  CHOL>  ?Mevacor on her list, but not currently taking, we will try to sort this out & f/u FLP later...  GERD, Dysphagia>  She had EGD during prev hosp w/ HH, gastritis & neg HPylori;  Prev on PPI Rx but off now & she feels she is OK, we will follow.  Hx of Colitis- presumed infectious>  Resolved w/ Cipro/ Flagyl prev; then diverticulitis back on these meds & followed by GI for resolution... PANCREATIC LESION>> CT Abd showed cystic pancreatic neoplasm ?etiology;  she has appt w/ drJacobs to see about EUS & bx... Divertics, Constip>  Related to her narcotic analgesics; improved w/ laxatives OTC...  DJD, s/p right THR, Chr Pain Syndrome> Trying to wean MSContin further which should be poss based on the fact that the painful right hip has been replaced...  Other medical problems as noted...    GYN> pt c/o spotting & has pessary in place since her last check in [place]; we will help her get a gyn appt ASAP for eval & check up...    Derm> she has a seborrheic dermatitis rash & we will Rx w/ Lotrisone cream..   Patient's Medications  New  Prescriptions   No medications on file  Previous Medications   ASPIRIN 81 MG EC TABLET    Take 81 mg by mouth daily.     CALCIUM CARBONATE-VIT D-MIN (CALTRATE 600+D PLUS) 600-400 MG-UNIT PER TABLET    Chew 1 tablet by mouth 2 (two) times daily.    CARVEDILOL (COREG) 6.25 MG TABLET    Take 0.5 tablets (3.125 mg total) by mouth 2 (two) times daily with a meal.   CHOLECALCIFEROL (VITAMIN D) 1000 UNITS TABLET    Take 2,000 Units by mouth daily.   CIPROFLOXACIN (CIPRO) 500 MG TABLET    Take 1 tablet (500 mg total) by mouth 2 (two) times daily.   CITALOPRAM (CELEXA) 20 MG TABLET    Take 1 tablet (20 mg total) by mouth daily.   CLOTRIMAZOLE-BETAMETHASONE (LOTRISONE) CREAM    Apply 1 application topically 2 (two) times daily as needed. For rashes on forehead.   FERROUS SULFATE 325 (65 FE) MG TABLET    Take 325 mg by mouth 2 (two) times daily.     FLUTICASONE-SALMETEROL (ADVAIR DISKUS) 100-50 MCG/DOSE AEPB    Inhale 1 puff into the lungs every 12 (twelve) hours.   LISINOPRIL (PRINIVIL,ZESTRIL) 5 MG TABLET    Take 1 tablet (5 mg total) by mouth daily.   LORATADINE (CLARITIN) 10 MG TABLET    Take 10 mg by mouth daily.     METRONIDAZOLE (FLAGYL) 500 MG TABLET    Take 1 tablet (500 mg total) by mouth 2 (two) times daily before a meal. Take through November 7.   MULTIPLE VITAMIN (MULTIVITAMIN WITH MINERALS) TABS    Take 1 tablet by mouth daily.   NAPROXEN SODIUM (ALEVE) 220 MG CAPS    Take 1 capsule by mouth 2 (two) times daily.   OXYCODONE (OXYCONTIN) 10 MG TB12    Take 1 tablet (10 mg total) by mouth every 12 (twelve) hours.   POLYETHYLENE GLYCOL (MIRALAX / GLYCOLAX) PACKET    Take 17 g by mouth daily. Do not take if you have diarrhea   TIOTROPIUM (SPIRIVA) 18 MCG INHALATION CAPSULE    Place 1 capsule (18 mcg total) into inhaler and inhale daily.   VITAMIN C (ASCORBIC ACID) 500 MG TABLET    Take 500 mg by mouth daily.     ZOLPIDEM (AMBIEN) 10 MG TABLET    Take 5 mg by mouth at bedtime as needed. For  insomnia  Modified Medications   Modified Medication Previous Medication   FUROSEMIDE (LASIX) 40 MG TABLET furosemide (LASIX) 40 MG tablet      Take 1 pill as needed for weight over 143 or greater as well as leg swelling.    Take 1 tablet (40 mg total) by mouth daily as needed. Take 1 pill as needed for weight over 143 or greater as well as leg  swelling.  Discontinued Medications   No medications on file

## 2011-11-15 NOTE — Patient Instructions (Addendum)
Today we updated your med list in our EPIC system...    Continue your current medications the same...  We reviewed your recent med changes in the hospital & the GI & Cardiac offices...  Call for any questions...  Today we did your follow up CXR & we will call you w/ the result...  Let's plan a follow up visit in about 2months, sooner if needed for problems.Marland KitchenMarland Kitchen

## 2011-11-16 ENCOUNTER — Telehealth: Payer: Self-pay | Admitting: Internal Medicine

## 2011-11-16 LAB — CEA: CEA: 2.7 ng/mL (ref 0.0–5.0)

## 2011-11-16 LAB — CANCER ANTIGEN 19-9: CA 19-9: 7.2 U/mL (ref <35.0)

## 2011-11-16 NOTE — Telephone Encounter (Signed)
l/m with appt that was needed and lab.md visit for 11/25  per pof from 11/4     aom

## 2011-11-20 ENCOUNTER — Ambulatory Visit: Payer: Medicare Other | Admitting: Physician Assistant

## 2011-11-21 ENCOUNTER — Ambulatory Visit: Payer: Medicare Other | Admitting: Pulmonary Disease

## 2011-11-23 ENCOUNTER — Telehealth: Payer: Self-pay | Admitting: Pulmonary Disease

## 2011-11-23 ENCOUNTER — Ambulatory Visit (HOSPITAL_COMMUNITY)
Admission: RE | Admit: 2011-11-23 | Discharge: 2011-11-23 | Disposition: A | Discharge status: Home / Self Care | Payer: Medicare Other | Source: Ambulatory Visit | Attending: Internal Medicine | Admitting: Internal Medicine

## 2011-11-23 ENCOUNTER — Other Ambulatory Visit: Payer: Self-pay | Admitting: *Deleted

## 2011-11-23 VITALS — BP 116/62 | HR 84 | Wt 147.5 lb

## 2011-11-23 DIAGNOSIS — I509 Heart failure, unspecified: Secondary | ICD-10-CM

## 2011-11-23 DIAGNOSIS — S8010XA Contusion of unspecified lower leg, initial encounter: Secondary | ICD-10-CM | POA: Insufficient documentation

## 2011-11-23 DIAGNOSIS — I5032 Chronic diastolic (congestive) heart failure: Secondary | ICD-10-CM

## 2011-11-23 DIAGNOSIS — I872 Venous insufficiency (chronic) (peripheral): Secondary | ICD-10-CM | POA: Insufficient documentation

## 2011-11-23 MED ORDER — LISINOPRIL 5 MG PO TABS: 5.0000 mg | ORAL_TABLET | Freq: Every day | ORAL | Status: DC

## 2011-11-23 MED ORDER — OXYCODONE HCL ER 10 MG PO T12A: 10.0000 mg | EXTENDED_RELEASE_TABLET | Freq: Two times a day (BID) | ORAL | Status: DC

## 2011-11-23 NOTE — Patient Instructions (Addendum)
Take lasix today and every M, W, F.  Follow up 3 weeks.

## 2011-11-23 NOTE — Telephone Encounter (Signed)
rx has been printed and signed by SN.  Called and lmom for the pts son to make him aware that this rx is up front and ready to be picked up.  Nothing further is needed.

## 2011-11-23 NOTE — Assessment & Plan Note (Signed)
Hematoma of left distal shin debrided clot by Tonye Becket, NP.  Will have [facility] evaluated for further wound care needs.

## 2011-11-23 NOTE — Progress Notes (Signed)
PCP/Pulmonologist: Dr [name]  HPI:  Katie Galvan is an 78 yo with history of mixed diastolic and systolic HF, LVEF 30%, restrictive lung disease, right hemidiaphragm elevation, COPD, GERD, history of endocarditis recently admitted for colitis requiring cipro/flagyl.  Presumed myocarditis treated with colchicine.  Echo 10/10/10: Hyperdynamic basal function Septal apical anterior hypokinesis The cavity size was severely dilated.  Wall thickness was increased in a pattern of moderate LVH.  There was mild concentric hypertrophy. The estimated ejection fraction was 30%.     12/12 ECHO EF recovered  55-60%  Echo 08/03/11 EF 65-70%.  Grade 1 diastolic dysfunction.  Mild MR.  Mildly dilated LA  She returns for post hospital follow up today with her daughter.  Last visit lasix 40 mg for weight 143 pounds or more (down from 145 pounds or more).  Holter monitor reviewed and shows NSR with occasional WAP.   She has not been taking lasix over the last 5 days due to weight 142 pounds.  Her stomach has continued to improve.  She denies orthopnea/PND.  No edema.  No further syncope.      ROS: All other systems normal except as mentioned in HPI, past medical history and problem list.    Past Medical History  Diagnosis Date  . Disorders of diaphragm   . Unspecified essential hypertension   . Endocarditis, valve unspecified, unspecified cause   . Cardiac dysrhythmia, unspecified   . Unspecified venous (peripheral) insufficiency   . Edema   . Pure hypercholesterolemia   . Other dysphagia   . Diverticulosis of colon (without mention of hemorrhage)   . Benign neoplasm of colon   . Osteoarthrosis, unspecified whether generalized or localized, unspecified site   . Lumbago   . Chronic pain syndrome   . Disorder of bone and cartilage, unspecified   . Depressive disorder, not elsewhere classified   . Anemia, unspecified   . Skin cancer   . Systolic heart failure     echo 10/10/10 EF 30%  . Hiatal hernia   .  Gastritis     Current Outpatient Prescriptions  Medication Sig Dispense Refill  . aspirin 81 MG EC tablet Take 81 mg by mouth daily.        . Calcium Carbonate-Vit D-Min (CALTRATE 600+D PLUS) 600-400 MG-UNIT per tablet Chew 1 tablet by mouth 2 (two) times daily.       . carvedilol (COREG) 6.25 MG tablet Take 0.5 tablets (3.125 mg total) by mouth 2 (two) times daily with a meal.  60 tablet  11  . cholecalciferol (VITAMIN D) 1000 UNITS tablet Take 2,000 Units by mouth daily.      . citalopram (CELEXA) 20 MG tablet Take 1 tablet (20 mg total) by mouth daily.  30 tablet  11  . clotrimazole-betamethasone (LOTRISONE) cream Apply 1 application topically 2 (two) times daily as needed. For rashes on forehead.      . ferrous sulfate 325 (65 FE) MG tablet Take 325 mg by mouth 2 (two) times daily.        . Fluticasone-Salmeterol (ADVAIR DISKUS) 100-50 MCG/DOSE AEPB Inhale 1 puff into the lungs every 12 (twelve) hours.  60 each  11  . furosemide (LASIX) 40 MG tablet Take 1 pill as needed for weight over 143 or greater as well as leg swelling.      Marland Kitchen lisinopril (PRINIVIL,ZESTRIL) 5 MG tablet Take 1 tablet (5 mg total) by mouth daily.  30 tablet  11  . loratadine (CLARITIN) 10 MG tablet Take  10 mg by mouth daily.        . Multiple Vitamin (MULTIVITAMIN WITH MINERALS) TABS Take 1 tablet by mouth daily.      . Naproxen Sodium (ALEVE) 220 MG CAPS Take 1 capsule by mouth 2 (two) times daily.      . OxyCODONE (OXYCONTIN) 10 mg T12A Take 1 tablet (10 mg total) by mouth every 12 (twelve) hours.  60 tablet  0  . polyethylene glycol (MIRALAX / GLYCOLAX) packet Take 17 g by mouth daily. Do not take if you have diarrhea  14 each  0  . tiotropium (SPIRIVA) 18 MCG inhalation capsule Place 1 capsule (18 mcg total) into inhaler and inhale daily.  30 capsule  11  . vitamin C (ASCORBIC ACID) 500 MG tablet Take 500 mg by mouth daily.        Marland Kitchen zolpidem (AMBIEN) 10 MG tablet Take 5 mg by mouth at bedtime as needed. For insomnia       Lasix only as needed.   Allergies  Allergen Reactions  . Sulfonamide Derivatives Swelling    PHYSICAL EXAM: Filed Vitals:   11/23/11 1350  BP: 116/62  Pulse: 84  Weight: 147 lb 8 oz (66.906 kg)  SpO2: 95%     General:  Well appearing. Tachypneic with conversation.  on O2. HEENT: normal Neck: supple. JVP 9-10. Carotids 2+ bilat; no bruits. No lymphadenopathy or thryomegaly appreciated. Cor: PMI nondisplaced. Regular rate & rhythm. No rubs, gallops or murmurs. Lungs: CTA Abdomen: soft, nontender, nondistended. No hepatosplenomegaly. No bruits or masses. Good bowel sounds. Extremities: no cyanosis, clubbing, rash, trace edema, Lt distal shin with hematoma  Neuro: alert & oriented x 3, cranial nerves grossly intact. moves all 4 extremities w/o difficulty. Affect pleasant.   ASSESSMENT & PLAN:

## 2011-11-23 NOTE — Telephone Encounter (Signed)
rx has been printed out and will have SN sign.  Will call and let mike know when this is ready to be picked up.

## 2011-11-23 NOTE — Assessment & Plan Note (Addendum)
Volume status elevated.  Will give lasix 40 mg today and every M, W, and Friday.  Have discussed fluid restrictions, low sodium diet and when to call the clinic for symptom management.  The patient and her son voice understanding.  Will follow up in 2 weeks with labs.   Patient seen and examined with Ulyess Blossom, PA-C. We discussed all aspects of the encounter. I agree with the assessment and plan as stated above. Volume status up. Will restart lasix. Extensive HF education provided.  Reinforced need for daily weights and reviewed use of sliding scale diuretics. Watch for hypotension.

## 2011-11-27 ENCOUNTER — Other Ambulatory Visit (HOSPITAL_BASED_OUTPATIENT_CLINIC_OR_DEPARTMENT_OTHER): Payer: Medicare Other | Admitting: Lab

## 2011-11-27 ENCOUNTER — Ambulatory Visit (HOSPITAL_BASED_OUTPATIENT_CLINIC_OR_DEPARTMENT_OTHER): Payer: Medicare Other | Admitting: Internal Medicine

## 2011-11-27 VITALS — BP 156/80 | HR 75 | Temp 95.2°F | Resp 22 | Ht 64.0 in | Wt 148.1 lb

## 2011-11-27 DIAGNOSIS — D649 Anemia, unspecified: Secondary | ICD-10-CM

## 2011-11-27 DIAGNOSIS — K869 Disease of pancreas, unspecified: Secondary | ICD-10-CM

## 2011-11-27 DIAGNOSIS — D638 Anemia in other chronic diseases classified elsewhere: Secondary | ICD-10-CM

## 2011-11-27 LAB — COMPREHENSIVE METABOLIC PANEL (CC13)
ALT: 11 U/L (ref 0–55)
AST: 16 U/L (ref 5–34)
Albumin: 3.4 g/dL — ABNORMAL LOW (ref 3.5–5.0)
Alkaline Phosphatase: 73 U/L (ref 40–150)
BUN: 22 mg/dL (ref 7.0–26.0)
Chloride: 100 mEq/L (ref 98–107)
Potassium: 4.1 mEq/L (ref 3.5–5.1)
Sodium: 139 mEq/L (ref 136–145)
Total Protein: 7.1 g/dL (ref 6.4–8.3)

## 2011-11-27 LAB — CBC WITH DIFFERENTIAL/PLATELET
Basophils Absolute: 0.1 10*3/uL (ref 0.0–0.1)
EOS%: 3.7 % (ref 0.0–7.0)
HGB: 10.7 g/dL — ABNORMAL LOW (ref 11.6–15.9)
MCH: 28.3 pg (ref 25.1–34.0)
MCV: 87.8 fL (ref 79.5–101.0)
MONO%: 6.8 % (ref 0.0–14.0)
NEUT#: 8.2 10*3/uL — ABNORMAL HIGH (ref 1.5–6.5)
RBC: 3.77 10*6/uL (ref 3.70–5.45)
RDW: 17.3 % — ABNORMAL HIGH (ref 11.2–14.5)
lymph#: 1.7 10*3/uL (ref 0.9–3.3)

## 2011-11-27 NOTE — Patient Instructions (Signed)
Your hemoglobin and hematocrit are stable and similar to the reading 18 months ago. Continue oral iron tablet. Followup with me on an as-needed basis

## 2011-11-27 NOTE — Progress Notes (Signed)
[facility] [phone]   [phone]  OFFICE PROGRESS NOTE  Michele Mcalpine, MD [facility] [address] [place] [address]  DIAGNOSIS:  1) reactive leukocytosis. 2) anemia of chronic disease plus/minus iron deficiency.  PRIOR THERAPY: None.  CURRENT THERAPY: Ferrous sulfate 325 mg by mouth 3 times a day.  INTERVAL HISTORY: Katie Galvan 78 y.o. female returns to the clinic today for followup visit accompanied her daughter-in-law. The patient was last seen on July of 2012. She has been doing fine with no specific complaints until recently she was admitted to [facility] with sigmoid colitis and was found to have pancreatic mass which is currently under evaluation by Dr. Christella Hartigan. During her hospitalization she was found to have decline in her hemoglobin and hematocrit ranging between 8.5 to  9.3 g/dL. She was referred back to me for evaluation and recommendation regarding her anemia. The patient is feeling fine today with no specific complaints except for mild fatigue. She denied having any bleeding issues, specifically no rectal bleeding. She has some mild bruises on the upper extremities.  MEDICAL HISTORY: Past Medical History  Diagnosis Date  . Disorders of diaphragm   . Unspecified essential hypertension   . Endocarditis, valve unspecified, unspecified cause   . Cardiac dysrhythmia, unspecified   . Unspecified venous (peripheral) insufficiency   . Edema   . Pure hypercholesterolemia   . Other dysphagia   . Diverticulosis of colon (without mention of hemorrhage)   . Benign neoplasm of colon   . Osteoarthrosis, unspecified whether generalized or localized, unspecified site   . Lumbago   . Chronic pain syndrome   . Disorder of bone and cartilage, unspecified   . Depressive disorder, not elsewhere classified   . Anemia, unspecified   . Skin cancer   . Systolic heart failure     echo 10/10/10 EF 30%  .  Hiatal hernia   . Gastritis     ALLERGIES:  is allergic to sulfonamide derivatives.  MEDICATIONS:  Current Outpatient Prescriptions  Medication Sig Dispense Refill  . aspirin 81 MG EC tablet Take 81 mg by mouth daily.        . Calcium Carbonate-Vit D-Min (CALTRATE 600+D PLUS) 600-400 MG-UNIT per tablet Chew 1 tablet by mouth 2 (two) times daily.       . carvedilol (COREG) 6.25 MG tablet Take 0.5 tablets (3.125 mg total) by mouth 2 (two) times daily with a meal.  60 tablet  11  . cholecalciferol (VITAMIN D) 1000 UNITS tablet Take 2,000 Units by mouth daily.      . citalopram (CELEXA) 20 MG tablet Take 1 tablet (20 mg total) by mouth daily.  30 tablet  11  . clotrimazole-betamethasone (LOTRISONE) cream Apply 1 application topically 2 (two) times daily as needed. For rashes on forehead.      . ferrous sulfate 325 (65 FE) MG tablet Take 325 mg by mouth 2 (two) times daily.        . Fluticasone-Salmeterol (ADVAIR DISKUS) 100-50 MCG/DOSE AEPB Inhale 1 puff into the lungs every 12 (twelve) hours.  60 each  11  . furosemide (LASIX) 40 MG tablet Take 1 pill as needed for weight over 143 or greater as well as leg swelling.      Marland Kitchen lisinopril (PRINIVIL,ZESTRIL) 5 MG tablet Take 1 tablet (5 mg total) by mouth daily.  30 tablet  11  . loratadine (CLARITIN) 10 MG tablet Take 10 mg  by mouth daily.        . metroNIDAZOLE (FLAGYL) 500 MG tablet Take 500 mg by mouth 3 (three) times daily.      . Multiple Vitamin (MULTIVITAMIN WITH MINERALS) TABS Take 1 tablet by mouth daily.      . Naproxen Sodium (ALEVE) 220 MG CAPS Take 1 capsule by mouth 2 (two) times daily.      . OxyCODONE (OXYCONTIN) 10 mg T12A Take 1 tablet (10 mg total) by mouth every 12 (twelve) hours.  60 tablet  0  . polyethylene glycol (MIRALAX / GLYCOLAX) packet Take 17 g by mouth daily. Do not take if you have diarrhea  14 each  0  . tiotropium (SPIRIVA) 18 MCG inhalation capsule Place 1 capsule (18 mcg total) into inhaler and inhale daily.  30  capsule  11  . vitamin C (ASCORBIC ACID) 500 MG tablet Take 500 mg by mouth daily.        Marland Kitchen zolpidem (AMBIEN) 10 MG tablet Take 5 mg by mouth at bedtime as needed. For insomnia      . morphine (MS CONTIN) 15 MG 12 hr tablet 15 mg by Per post-pyloric tube route 2 (two) times daily.         SURGICAL HISTORY:  Past Surgical History  Procedure Date  . Appendectomy   . Breast surgery   . Right wrist surgery   . Bilateral cataracts   . Hip surgery   . Tonsillectomy   . Adenoidectomy     REVIEW OF SYSTEMS:  A comprehensive review of systems was negative except for: Constitutional: positive for fatigue   PHYSICAL EXAMINATION: General appearance: alert, cooperative, fatigued and no distress Head: Normocephalic, without obvious abnormality, atraumatic Neck: no adenopathy Resp: clear to auscultation bilaterally Cardio: regular rate and rhythm, S1, S2 normal, no murmur, click, rub or gallop GI: soft, non-tender; bowel sounds normal; no masses,  no organomegaly Extremities: extremities normal, atraumatic, no cyanosis or edema  ECOG PERFORMANCE STATUS: 1 - Symptomatic but completely ambulatory  Blood pressure 156/80, pulse 75, temperature 95.2 F (35.1 C), temperature source Oral, resp. rate 22, height 5\' 4"  (1.626 m), weight 148 lb 1.6 oz (67.178 kg).  LABORATORY DATA: Lab Results  Component Value Date   WBC 11.1* 11/27/2011   HGB 10.7* 11/27/2011   HCT 33.1* 11/27/2011   MCV 87.8 11/27/2011   PLT 369 11/27/2011      Chemistry      Component Value Date/Time   NA 137 11/08/2011 1539   K 4.5 11/08/2011 1539   CL 101 11/08/2011 1539   CO2 29 11/08/2011 1539   BUN 22 11/08/2011 1539   CREATININE 0.81 11/08/2011 1539      Component Value Date/Time   CALCIUM 9.3 11/08/2011 1539   ALKPHOS 76 11/08/2011 1539   AST 15 11/08/2011 1539   ALT 12 11/08/2011 1539   BILITOT 0.2* 11/08/2011 1539       RADIOGRAPHIC STUDIES: Dg Chest 2 View  11/15/2011  *RADIOLOGY REPORT*  Clinical Data:  Recent colitis, chronic respiratory failure, shortness of breath, COPD  CHEST - 2 VIEW  Comparison: 08/02/2011  Findings: Marked chronic elevation of the right diaphragm. Upper normal heart size. Calcified tortuous aorta. Pulmonary vascular congestion. Chronic right basilar atelectasis. Left lung clear. Minimal chronic peribronchial thickening. No pleural effusion or pneumothorax.  IMPRESSION: Marked chronic elevation of right diaphragm. Minimal chronic bronchitic changes and right basilar atelectasis.   Original Report Authenticated By: Ulyses Southward, M.D.    Ct Abdomen  Pelvis W Contrast  10/31/2011  *RADIOLOGY REPORT*  Clinical Data: Right lower quadrant pain.  Lower abdominal pain and nausea.  CT ABDOMEN AND PELVIS WITH CONTRAST  Technique:  Multidetector CT imaging of the abdomen and pelvis was performed following the standard protocol during bolus administration of intravenous contrast.  Contrast: OMNIPAQUE IOHEXOL 300 MG/ML  SOLN  Comparison: CT abdomen and pelvis with contrast 10/09/2011.  Findings: Mild dependent atelectasis is present at the right lung base.  Elevation of the right hemidiaphragm is stable.  Heart size is normal.  No significant pleural or pericardial effusion is present.  The liver and spleen are within normal limits.  A small hiatal hernia is stable.  The stomach and duodenum are within normal limits.  A hypodense pancreatic mass at the uncinate process measures at least 3.7 x 1.4 cm, similar to the prior exam.  There is mild pancreatic duct dilation.  The adrenal glands are normal bilaterally.  Renal cysts are stable.  Atherosclerotic calcifications are noted in the aorta.  Fluid levels are present in the colon.  Free fluid is similar to the prior exam.  Extensive wall thickening and inflammatory changes present through the sigmoid colon.  Diverticular changes are evident in the descending colon with additional more mild inflammatory changes.  The more proximal transverse colon is  unremarkable.  Small bowel is within normal limits.  Asymmetric wall thickening is noted along the urinary bladder, new from prior last exam.  The patient is status post right total hip arthroplasty. Degenerative changes are again noted in the lower lumbar spine.  IMPRESSION:  1.  Stable hypodense mass at the onset process of the pancreas may represent a cystic neoplasm.  This recommend non emergent ultrasound for further evaluation. 2.  Stable atrophic and cystic changes of the kidneys. 3.  Nonobstructing 5 mm stone at the lower pole of the left kidney. 4.  Changes compatible with sigmoid colitis, likely secondary to diverticular disease. 5.  Free fluid in the anatomic pelvis. 6.  Atherosclerosis.   Original Report Authenticated By: Jamesetta Orleans. MATTERN, M.D.     ASSESSMENT: This is a very pleasant 78 years old white female with history of anemia of chronic disease plus/minus deficiency. The patient is doing fine today in her hemoglobin and hematocrit improved on the oral iron tablets.  PLAN: I recommended for her to continue on oral ferrous sulfate 325 mg by mouth 3 times a day. The patient will continue her routine followup visit with [name]. She is currently undergoing evaluation for a pancreatic mass under the care of Dr. Christella Hartigan but her tumor markers are unremarkable. I would see the patient an as-needed basis. She knows to call me if she has any concerning symptoms regarding her anemia.  All questions were answered. The patient knows to call the clinic with any problems, questions or concerns. We can certainly see the patient much sooner if necessary.

## 2011-11-30 NOTE — Addendum Note (Signed)
Encounter addended by: Dolores Patty, MD on: 11/30/2011  9:22 PM<BR>     Documentation filed: Follow-up Section, LOS Section

## 2011-12-04 ENCOUNTER — Telehealth: Payer: Self-pay | Admitting: Pulmonary Disease

## 2011-12-04 NOTE — Telephone Encounter (Signed)
ATC was on hold x 5 minutes wcb

## 2011-12-04 NOTE — Telephone Encounter (Signed)
Please advise thanks.

## 2011-12-04 NOTE — Telephone Encounter (Signed)
Called and spoke with kim from [facility]---given verbal order to kim for the OT and PT.  She stated that she wanted to work with the pt since she is not moving around good.

## 2011-12-04 NOTE — Telephone Encounter (Signed)
Per SN---yes.  thanks 

## 2011-12-05 NOTE — Telephone Encounter (Signed)
I spoke with Katie Galvan and is aware of this. She needed nothing further was needed

## 2011-12-08 ENCOUNTER — Ambulatory Visit: Payer: Medicare Other | Admitting: Gastroenterology

## 2011-12-14 ENCOUNTER — Ambulatory Visit (HOSPITAL_COMMUNITY)
Admission: RE | Admit: 2011-12-14 | Discharge: 2011-12-14 | Disposition: A | Discharge status: Home / Self Care | Payer: Medicare Other | Source: Ambulatory Visit | Attending: Internal Medicine | Admitting: Internal Medicine

## 2011-12-14 VITALS — BP 136/70 | HR 65 | Wt 151.8 lb

## 2011-12-14 DIAGNOSIS — S81809A Unspecified open wound, unspecified lower leg, initial encounter: Secondary | ICD-10-CM | POA: Insufficient documentation

## 2011-12-14 DIAGNOSIS — X58XXXA Exposure to other specified factors, initial encounter: Secondary | ICD-10-CM | POA: Insufficient documentation

## 2011-12-14 DIAGNOSIS — I5042 Chronic combined systolic (congestive) and diastolic (congestive) heart failure: Secondary | ICD-10-CM

## 2011-12-14 DIAGNOSIS — S91009A Unspecified open wound, unspecified ankle, initial encounter: Secondary | ICD-10-CM

## 2011-12-14 DIAGNOSIS — S81009A Unspecified open wound, unspecified knee, initial encounter: Secondary | ICD-10-CM | POA: Insufficient documentation

## 2011-12-14 MED ORDER — FUROSEMIDE 40 MG PO TABS: 40.0000 mg | ORAL_TABLET | Freq: Every day | ORAL | Status: DC

## 2011-12-14 NOTE — Assessment & Plan Note (Addendum)
Volume status elevated. Weight up about 5 pounds. Increase Lasix to 40 mg daily and give an extra 40 mg tomorrow. [facility] to check BMET next week and fax results to HF clinic. Reinforced daily weights and low salt food choices.  Follow up in 2 weeks to reassess volume status.

## 2011-12-14 NOTE — Patient Instructions (Addendum)
Follow up in 2 weeks  We will refer you to the Outpatient Wound Center at [facility]   Take lasix 40 mg daily  Tomorrow take Lasix 40 mg in am and pm  Do the following things EVERYDAY: 1) Weigh yourself in the morning before breakfast. Write it down and keep it in a log. 2) Take your medicines as prescribed 3) Eat low salt foods-Limit salt (sodium) to 2000 mg per day.  4) Stay as active as you can everyday 5) Limit all fluids for the day to less than 2 liters

## 2011-12-14 NOTE — Assessment & Plan Note (Signed)
Continue conservative wound care to LLE. Will need to refer to Outpatient Wound Center at [facility]

## 2011-12-14 NOTE — Progress Notes (Signed)
Patient ID: Katie Galvan, female   DOB: [birth year], 78 y.o.   MRN: [id number] PCP/Pulmonologist: Dr [name]  HPI:  Katie Galvan is an 78 yo with history of mixed diastolic and systolic HF, LVEF 30%, restrictive lung disease, right hemidiaphragm elevation, COPD, GERD, history of endocarditis recently admitted for colitis requiring cipro/flagyl.  Presumed myocarditis treated with colchicine.    10/10/10 ECHO: Hyperdynamic basal function Septal apical anterior hypokinesis The cavity size was severely dilated.  Wall thickness was increased in a pattern of moderate LVH.  There was mild concentric hypertrophy. The estimated ejection fraction was 30%.    12/12 ECHO EF recovered  55-60%  Echo 08/03/11 EF 65-70%.  Grade 1 diastolic dysfunction.  Mild MR.  Mildly dilated LA  She returns for follow up. Complains of dyspnea with exertion. + Orthopnea.  Denies PND. She is taking lasix every M-W-F. Weight at home 144 pounds. Complaint with medications. Son prepares pill box. Lives in Assisted Living. On Chronic 2 liters [place]. Followed by [facility] for LLE wound. Chronic RLE scab noted. On chronic 2 liters [place] continuously.   ROS: All other systems normal except as mentioned in HPI, past medical history and problem list.    Past Medical History  Diagnosis Date  . Disorders of diaphragm   . Unspecified essential hypertension   . Endocarditis, valve unspecified, unspecified cause   . Cardiac dysrhythmia, unspecified   . Unspecified venous (peripheral) insufficiency   . Edema   . Pure hypercholesterolemia   . Other dysphagia   . Diverticulosis of colon (without mention of hemorrhage)   . Benign neoplasm of colon   . Osteoarthrosis, unspecified whether generalized or localized, unspecified site   . Lumbago   . Chronic pain syndrome   . Disorder of bone and cartilage, unspecified   . Depressive disorder, not elsewhere classified   . Anemia, unspecified   . Skin cancer   . Systolic heart failure     echo  10/10/10 EF 30%  . Hiatal hernia   . Gastritis     Current Outpatient Prescriptions  Medication Sig Dispense Refill  . aspirin 81 MG EC tablet Take 81 mg by mouth daily.        . Calcium Carbonate-Vit D-Min (CALTRATE 600+D PLUS) 600-400 MG-UNIT per tablet Chew 1 tablet by mouth 2 (two) times daily.       . carvedilol (COREG) 6.25 MG tablet Take 0.5 tablets (3.125 mg total) by mouth 2 (two) times daily with a meal.  60 tablet  11  . cholecalciferol (VITAMIN D) 1000 UNITS tablet Take 2,000 Units by mouth daily.      . citalopram (CELEXA) 20 MG tablet Take 1 tablet (20 mg total) by mouth daily.  30 tablet  11  . clotrimazole-betamethasone (LOTRISONE) cream Apply 1 application topically 2 (two) times daily as needed. For rashes on forehead.      . ferrous sulfate 325 (65 FE) MG tablet Take 325 mg by mouth 2 (two) times daily.        . Fluticasone-Salmeterol (ADVAIR DISKUS) 100-50 MCG/DOSE AEPB Inhale 1 puff into the lungs every 12 (twelve) hours.  60 each  11  . furosemide (LASIX) 40 MG tablet Take 1 pill as needed for weight over 143 or greater as well as leg swelling.      Marland Kitchen lisinopril (PRINIVIL,ZESTRIL) 5 MG tablet Take 1 tablet (5 mg total) by mouth daily.  30 tablet  11  . loratadine (CLARITIN) 10 MG tablet Take 10 mg by mouth  daily.        . metroNIDAZOLE (FLAGYL) 500 MG tablet Take 500 mg by mouth 3 (three) times daily.      Marland Kitchen morphine (MS CONTIN) 15 MG 12 hr tablet 15 mg by Per post-pyloric tube route 2 (two) times daily.       . Multiple Vitamin (MULTIVITAMIN WITH MINERALS) TABS Take 1 tablet by mouth daily.      . Naproxen Sodium (ALEVE) 220 MG CAPS Take 1 capsule by mouth 2 (two) times daily.      . OxyCODONE (OXYCONTIN) 10 mg T12A Take 1 tablet (10 mg total) by mouth every 12 (twelve) hours.  60 tablet  0  . polyethylene glycol (MIRALAX / GLYCOLAX) packet Take 17 g by mouth daily. Do not take if you have diarrhea  14 each  0  . tiotropium (SPIRIVA) 18 MCG inhalation capsule Place 1  capsule (18 mcg total) into inhaler and inhale daily.  30 capsule  11  . vitamin C (ASCORBIC ACID) 500 MG tablet Take 500 mg by mouth daily.        Marland Kitchen zolpidem (AMBIEN) 10 MG tablet Take 5 mg by mouth at bedtime as needed. For insomnia      Lasix only as needed.   Allergies  Allergen Reactions  . Sulfonamide Derivatives Swelling    PHYSICAL EXAM: Filed Vitals:   12/14/11 1450  BP: 136/70  Pulse: 65  Weight: 151 lb 12 oz (68.833 kg)  SpO2: 96%     General:  Chronically ill appearing. Tachypneic with conversation.  on O2 2 liters HEENT: normal Neck: supple. JVP to jaw.  Carotids 2+ bilat; no bruits. No lymphadenopathy or thryomegaly appreciated. Cor: PMI nondisplaced. Regular rate & rhythm. No rubs, gallops or murmurs. Lungs: CTA Abdomen: soft, nontender, nondistended. No hepatosplenomegaly. No bruits or masses. Good bowel sounds. Extremities: no cyanosis, clubbing, rash, trace edema, LLE 5x4x0.3cm full thickness RLE intact scab noted.  Neuro: alert & oriented x 3, cranial nerves grossly intact. moves all 4 extremities w/o difficulty. Affect pleasant.   ASSESSMENT & PLAN:

## 2011-12-15 ENCOUNTER — Encounter (HOSPITAL_COMMUNITY): Payer: Medicare Other

## 2011-12-18 ENCOUNTER — Telehealth (HOSPITAL_COMMUNITY): Payer: Self-pay | Admitting: Physician Assistant

## 2011-12-18 DIAGNOSIS — I5020 Unspecified systolic (congestive) heart failure: Secondary | ICD-10-CM

## 2011-12-18 MED ORDER — SPIRONOLACTONE 25 MG PO TABS: 12.5000 mg | ORAL_TABLET | Freq: Every day | ORAL | Status: DC

## 2011-12-18 NOTE — Telephone Encounter (Signed)
Received phone call from Mora with [facility] to review patient's meds.  Have reviewed all meds with Misty Stanley.  Patient is taking spiro 12.5 mg daily, BP ok so will continue at this time.  Patient is not taking twice daily meds until 1 or 2 pm.  Have decided to set patient up with telemonitoring.  Appreciated Lisa's call.

## 2011-12-22 ENCOUNTER — Telehealth: Payer: Self-pay | Admitting: Pulmonary Disease

## 2011-12-22 NOTE — Telephone Encounter (Signed)
Patient wanting to know if cholesterol has been checked this year.  I informed her that is has not she verbalized understanding of this and stated she would have it checked in January 2014.  Nothing further needed at this time.

## 2011-12-25 ENCOUNTER — Telehealth: Payer: Self-pay | Admitting: Pulmonary Disease

## 2011-12-25 NOTE — Telephone Encounter (Signed)
Gave VO. Nothing further was needed

## 2011-12-28 ENCOUNTER — Encounter: Payer: Self-pay | Admitting: Gastroenterology

## 2011-12-28 ENCOUNTER — Ambulatory Visit (INDEPENDENT_AMBULATORY_CARE_PROVIDER_SITE_OTHER): Payer: Medicare Other | Admitting: Gastroenterology

## 2011-12-28 VITALS — BP 162/90 | HR 80 | Ht 60.0 in | Wt 152.0 lb

## 2011-12-28 DIAGNOSIS — R933 Abnormal findings on diagnostic imaging of other parts of digestive tract: Secondary | ICD-10-CM

## 2011-12-28 NOTE — Patient Instructions (Addendum)
Repeat CT of pancreas in 6 months (IV and PO contrast) to check for interval change in known pancreas lesion. Call sooner if any issues.

## 2011-12-28 NOTE — Progress Notes (Signed)
HPI: This is a     very pleasant 78 year old woman whom I am meeting for the first time today.  Seen by [facility] about 6 weeks ago.  Staffed by [facility].  The pains are completely gone now.  She has been through 2 abx courses.  HAs been on home oxygen for at least 2 years.    SOB without oxygen.  Colonoscopy a long time ago was normal.  I reviewed  her 2013, November CT scan abdomen and pelvis which describes sigmoid inflammation suspicious for diverticulitis. There was also an area of her uncinate pancreas that looked abnormal however this was stable as compared to a scan one year prior. I also review the images from that 2012 CT scan abdomen and pelvis  Ca 19-9 level last month was normal.  Weight is up 3 pounds in past 6 weeks.  lfts have been normal.  CBC shows slightly elevated WBC.  Review of systems: Pertinent positive and negative review of systems were noted in the above HPI section. Complete review of systems was performed and was otherwise normal.    Past Medical History  Diagnosis Date  . Disorders of diaphragm   . Unspecified essential hypertension   . Endocarditis, valve unspecified, unspecified cause   . Cardiac dysrhythmia, unspecified   . Unspecified venous (peripheral) insufficiency   . Edema   . Pure hypercholesterolemia   . Other dysphagia   . Diverticulosis of colon (without mention of hemorrhage)   . Benign neoplasm of colon   . Osteoarthrosis, unspecified whether generalized or localized, unspecified site   . Lumbago   . Chronic pain syndrome   . Disorder of bone and cartilage, unspecified   . Depressive disorder, not elsewhere classified   . Anemia, unspecified   . Skin cancer   . Systolic heart failure     echo 10/10/10 EF 30%  . Hiatal hernia   . Gastritis     Past Surgical History  Procedure Date  . Appendectomy   . Breast surgery   . Right wrist surgery   . Bilateral cataracts   . Hip surgery   . Tonsillectomy   . Adenoidectomy     Current  Outpatient Prescriptions  Medication Sig Dispense Refill  . aspirin 81 MG EC tablet Take 81 mg by mouth daily.        . Calcium Carbonate-Vit D-Min (CALTRATE 600+D PLUS) 600-400 MG-UNIT per tablet Chew 1 tablet by mouth 2 (two) times daily.       . carvedilol (COREG) 6.25 MG tablet Take 0.5 tablets (3.125 mg total) by mouth 2 (two) times daily with a meal.  60 tablet  11  . cholecalciferol (VITAMIN D) 1000 UNITS tablet Take 2,000 Units by mouth daily.      . citalopram (CELEXA) 20 MG tablet Take 1 tablet (20 mg total) by mouth daily.  30 tablet  11  . clotrimazole-betamethasone (LOTRISONE) cream Apply 1 application topically 2 (two) times daily as needed. For rashes on forehead.      . ferrous sulfate 325 (65 FE) MG tablet Take 325 mg by mouth 2 (two) times daily.        . Fluticasone-Salmeterol (ADVAIR DISKUS) 100-50 MCG/DOSE AEPB Inhale 1 puff into the lungs every 12 (twelve) hours.  60 each  11  . furosemide (LASIX) 40 MG tablet Take 1 tablet (40 mg total) by mouth daily.  35 tablet  6  . lisinopril (PRINIVIL,ZESTRIL) 5 MG tablet Take 1 tablet (5 mg total) by  mouth daily.  30 tablet  11  . loratadine (CLARITIN) 10 MG tablet Take 10 mg by mouth daily.        Marland Kitchen morphine (MS CONTIN) 15 MG 12 hr tablet 15 mg by Per post-pyloric tube route. Take 15mg  in the morning and 10mg  at night      . Multiple Vitamin (MULTIVITAMIN WITH MINERALS) TABS Take 1 tablet by mouth daily.      . Naproxen Sodium (ALEVE) 220 MG CAPS Take 1 capsule by mouth 2 (two) times daily.      . OxyCODONE (OXYCONTIN) 10 mg T12A Take 1 tablet (10 mg total) by mouth every 12 (twelve) hours.  60 tablet  0  . polyethylene glycol (MIRALAX / GLYCOLAX) packet Take 17 g by mouth daily. Do not take if you have diarrhea  14 each  0  . spironolactone (ALDACTONE) 25 MG tablet Take 0.5 tablets (12.5 mg total) by mouth daily.  90 tablet  3  . tiotropium (SPIRIVA) 18 MCG inhalation capsule Place 1 capsule (18 mcg total) into inhaler and inhale  daily.  30 capsule  11  . vitamin C (ASCORBIC ACID) 500 MG tablet Take 500 mg by mouth daily.        Marland Kitchen zolpidem (AMBIEN) 10 MG tablet Take 5 mg by mouth at bedtime as needed. For insomnia        Allergies as of 12/28/2011 - Review Complete 12/28/2011  Allergen Reaction Noted  . Sulfonamide derivatives Swelling     Family History  Problem Relation Age of Onset  . Diabetes Mother   . Heart disease Father   . Lung cancer Sister     History   Social History  . Marital Status: Single    Spouse Name: N/A    Number of Children: 2  . Years of Education: N/A   Occupational History  . Retired  - worked at a [employer] sales [employer]    Social History Main Topics  . Smoking status: Never Smoker   . Smokeless tobacco: Never Used  . Alcohol Use: No  . Drug Use: No  . Sexually Active: Not Currently   Other Topics Concern  . Not on file   Social History Narrative   Pt has independent apartment at [place].       Physical Exam: BP 162/90  Pulse 80  Ht 5' (1.524 m)  Wt 152 lb (68.947 kg)  BMI 29.69 kg/m2 Constitutional: Frail, walks with a walker, and oxygen in place nasal cannula. Psychiatric: alert and oriented x3 Eyes: extraocular movements intact Mouth: oral pharynx moist, no lesions Neck: supple no lymphadenopathy Cardiovascular: heart regular rate and rhythm Lungs: clear to auscultation bilaterally Abdomen: soft, nontender, nondistended, no obvious ascites, no peritoneal signs, normal bowel sounds Extremities: no lower extremity edema bilaterally Skin: no lesions on visible extremities    Assessment and plan: 78 y.o. female with  recent clinical and CT scan supported diverticulitis, improved. Abnormal pancreas.  She is on home oxygen, walks with a walker, quite frail appearing. I think endoscopic testing should only be done if absolutely necessary for her. I think it is highly unlikely that she has a pancreatic neoplasm that is of importance given the fact that the lesion in her  uncinate pancreas has been stable for at least a year now. Her CA 19-9 was also normal. Her LFTs were also normal. She is gaining weight lately. We will reimage her pancreas in 6 months with a CT scan and if that shows no concerning interval  changes then no further surveillance is necessary. Likewise for her sigmoid diverticulitis. She feels much better after 2 courses of antibiotics.

## 2012-01-01 ENCOUNTER — Encounter (HOSPITAL_COMMUNITY): Payer: Self-pay

## 2012-01-01 ENCOUNTER — Ambulatory Visit (HOSPITAL_COMMUNITY)
Admission: RE | Admit: 2012-01-01 | Discharge: 2012-01-01 | Disposition: A | Discharge status: Home / Self Care | Payer: Medicare Other | Source: Ambulatory Visit | Attending: Internal Medicine | Admitting: Internal Medicine

## 2012-01-01 VITALS — BP 148/92 | HR 66 | Wt 151.4 lb

## 2012-01-01 DIAGNOSIS — I5042 Chronic combined systolic (congestive) and diastolic (congestive) heart failure: Secondary | ICD-10-CM

## 2012-01-01 DIAGNOSIS — I5032 Chronic diastolic (congestive) heart failure: Secondary | ICD-10-CM | POA: Insufficient documentation

## 2012-01-01 DIAGNOSIS — I509 Heart failure, unspecified: Secondary | ICD-10-CM

## 2012-01-01 DIAGNOSIS — S81802A Unspecified open wound, left lower leg, initial encounter: Secondary | ICD-10-CM

## 2012-01-01 DIAGNOSIS — I951 Orthostatic hypotension: Secondary | ICD-10-CM

## 2012-01-01 MED ORDER — SPIRONOLACTONE 25 MG PO TABS: 25.0000 mg | ORAL_TABLET | Freq: Every day | ORAL | Status: DC

## 2012-01-01 MED ORDER — BISOPROLOL FUMARATE 5 MG PO TABS: 5.0000 mg | ORAL_TABLET | Freq: Every day | ORAL | Status: DC

## 2012-01-01 NOTE — Patient Instructions (Addendum)
Stop carvedilol  Take Bisoprolol 5 mg daily  Take Sprinolactone 25 mg daily  Follow up in 2 months  Do the following things EVERYDAY: 1) Weigh yourself in the morning before breakfast. Write it down and keep it in a log. 2) Take your medicines as prescribed 3) Eat low salt foods-Limit salt (sodium) to 2000 mg per day.  4) Stay as active as you can everyday 5) Limit all fluids for the day to less than 2 liters

## 2012-01-01 NOTE — Assessment & Plan Note (Signed)
Continue topical wound care per [facility].

## 2012-01-01 NOTE — Assessment & Plan Note (Addendum)
Given COPD and ongoing dyspnea. Will stop carvedilol and switch to cardioselective beta blocker. Bisoprolol 5 mg daily. Volume status mildly elevated. Increase Spironolactone 25 mg daily. [facility] to check BMET next.  Follow up in 2 months.

## 2012-01-01 NOTE — Progress Notes (Signed)
Patient ID: Katie Galvan, female   DOB: Jan 22, 1927, 78 y.o.   MRN: [id number]  Weight Range   Baseline proBNP     HPI: Katie Galvan is an 78 yo with history of mixed diastolic and systolic HF, LVEF 30%, restrictive lung disease, right hemidiaphragm elevation, COPD, GERD, history of endocarditis recently admitted for colitis requiring cipro/flagyl. Presumed myocarditis treated with colchicine.   10/10/10 ECHO: Hyperdynamic basal function Septal apical anterior hypokinesis The cavity size was severely dilated. Wall thickness was increased in a pattern of moderate LVH. There was mild concentric hypertrophy. The estimated ejection fraction was 30%.  12/12 ECHO EF recovered 55-60%  Echo 08/03/11 EF 65-70%. Grade 1 diastolic dysfunction. Mild MR. Mildly dilated LA   She returns for follow up. Last visit lasix increased to 40 mg daily. Complains of dyspnea with exertion. + Orthopnea. Denies PND.  Weight at home 144-146 pounds. Complaint with medications. Son prepares pill box. Lives in Assisted Living. On Chronic 2 liters [place]. Followed by [facility] for LLE wound. Chronic RLE scab noted. On chronic 2 liters [place] continuously.     ROS: All systems negative except as listed in HPI, PMH and Problem List.  Past Medical History  Diagnosis Date  . Disorders of diaphragm   . Unspecified essential hypertension   . Endocarditis, valve unspecified, unspecified cause   . Cardiac dysrhythmia, unspecified   . Unspecified venous (peripheral) insufficiency   . Edema   . Pure hypercholesterolemia   . Other dysphagia   . Diverticulosis of colon (without mention of hemorrhage)   . Benign neoplasm of colon   . Osteoarthrosis, unspecified whether generalized or localized, unspecified site   . Lumbago   . Chronic pain syndrome   . Disorder of bone and cartilage, unspecified   . Depressive disorder, not elsewhere classified   . Anemia, unspecified   . Skin cancer   . Systolic heart failure     echo 10/10/10 EF 30%    . Hiatal hernia   . Gastritis     Current Outpatient Prescriptions  Medication Sig Dispense Refill  . aspirin 81 MG EC tablet Take 81 mg by mouth daily.        . Calcium Carbonate-Vit D-Min (CALTRATE 600+D PLUS) 600-400 MG-UNIT per tablet Chew 1 tablet by mouth 2 (two) times daily.       . carvedilol (COREG) 6.25 MG tablet Take 0.5 tablets (3.125 mg total) by mouth 2 (two) times daily with a meal.  60 tablet  11  . cholecalciferol (VITAMIN D) 1000 UNITS tablet Take 2,000 Units by mouth daily.      . citalopram (CELEXA) 20 MG tablet Take 1 tablet (20 mg total) by mouth daily.  30 tablet  11  . clotrimazole-betamethasone (LOTRISONE) cream Apply 1 application topically 2 (two) times daily as needed. For rashes on forehead.      . ferrous sulfate 325 (65 FE) MG tablet Take 325 mg by mouth 2 (two) times daily.        . Fluticasone-Salmeterol (ADVAIR DISKUS) 100-50 MCG/DOSE AEPB Inhale 1 puff into the lungs every 12 (twelve) hours.  60 each  11  . furosemide (LASIX) 40 MG tablet Take 1 tablet (40 mg total) by mouth daily.  35 tablet  6  . lisinopril (PRINIVIL,ZESTRIL) 5 MG tablet Take 1 tablet (5 mg total) by mouth daily.  30 tablet  11  . loratadine (CLARITIN) 10 MG tablet Take 10 mg by mouth daily.        Marland Kitchen  morphine (MS CONTIN) 15 MG 12 hr tablet 15 mg by Per post-pyloric tube route. Take 15mg  in the morning and 10mg  at night      . Multiple Vitamin (MULTIVITAMIN WITH MINERALS) TABS Take 1 tablet by mouth daily.      . Naproxen Sodium (ALEVE) 220 MG CAPS Take 1 capsule by mouth 2 (two) times daily.      . OxyCODONE (OXYCONTIN) 10 mg T12A Take 1 tablet (10 mg total) by mouth every 12 (twelve) hours.  60 tablet  0  . polyethylene glycol (MIRALAX / GLYCOLAX) packet Take 17 g by mouth daily. Do not take if you have diarrhea  14 each  0  . spironolactone (ALDACTONE) 25 MG tablet Take 0.5 tablets (12.5 mg total) by mouth daily.  90 tablet  3  . tiotropium (SPIRIVA) 18 MCG inhalation capsule Place 1  capsule (18 mcg total) into inhaler and inhale daily.  30 capsule  11  . vitamin C (ASCORBIC ACID) 500 MG tablet Take 500 mg by mouth daily.        Marland Kitchen zolpidem (AMBIEN) 10 MG tablet Take 5 mg by mouth at bedtime as needed. For insomnia         PHYSICAL EXAM: Filed Vitals:   01/01/12 1437  BP: 148/92  Pulse: 66  Weight: 151 lb 6.4 oz (68.675 kg)  SpO2: 97%    General:  Elderly frail. Walking with oxygen and rolling walker.  No resp difficulty Son present HEENT: normal Neck: supple. JVP 10-11. Carotids 2+ bilaterally; no bruits. No lymphadenopathy or thryomegaly appreciated. Cor: PMI normal. Regular rate & rhythm. No rubs, gallops or murmurs. Lungs: clear 2 liters [place] Abdomen: soft, nontender, nondistended. No hepatosplenomegaly. No bruits or masses. Good bowel sounds. Extremities: no cyanosis, clubbing, rash,  R and LLE anterior aspect. LLE 2+ edema RLE trace.  Neuro: alert & orientedx3, cranial nerves grossly intact. Moves all 4 extremities w/o difficulty. Affect pleasant.    ASSESSMENT & PLAN:

## 2012-01-01 NOTE — Assessment & Plan Note (Deleted)
Volume status mildly elevated. Increase Spironolactone 25 mg daily. Follow up in 2 months.

## 2012-01-01 NOTE — Assessment & Plan Note (Signed)
Volume status mildly elevated. Increase Spironolactone to 25 mg daily. Follow up in 2 months.

## 2012-01-09 ENCOUNTER — Ambulatory Visit (INDEPENDENT_AMBULATORY_CARE_PROVIDER_SITE_OTHER): Payer: Medicare Other | Admitting: Pulmonary Disease

## 2012-01-09 ENCOUNTER — Encounter: Payer: Self-pay | Admitting: Pulmonary Disease

## 2012-01-09 VITALS — BP 128/66 | HR 60 | Temp 97.4°F | Ht 64.0 in | Wt 151.4 lb

## 2012-01-09 DIAGNOSIS — S81802A Unspecified open wound, left lower leg, initial encounter: Secondary | ICD-10-CM

## 2012-01-09 DIAGNOSIS — G894 Chronic pain syndrome: Secondary | ICD-10-CM

## 2012-01-09 DIAGNOSIS — R0600 Dyspnea, unspecified: Secondary | ICD-10-CM

## 2012-01-09 DIAGNOSIS — J9610 Chronic respiratory failure, unspecified whether with hypoxia or hypercapnia: Secondary | ICD-10-CM

## 2012-01-09 DIAGNOSIS — J9611 Chronic respiratory failure with hypoxia: Secondary | ICD-10-CM

## 2012-01-09 DIAGNOSIS — M899 Disorder of bone, unspecified: Secondary | ICD-10-CM

## 2012-01-09 DIAGNOSIS — M1990 Unspecified osteoarthritis, unspecified site: Secondary | ICD-10-CM

## 2012-01-09 DIAGNOSIS — I872 Venous insufficiency (chronic) (peripheral): Secondary | ICD-10-CM

## 2012-01-09 DIAGNOSIS — F329 Major depressive disorder, single episode, unspecified: Secondary | ICD-10-CM

## 2012-01-09 DIAGNOSIS — I5042 Chronic combined systolic (congestive) and diastolic (congestive) heart failure: Secondary | ICD-10-CM

## 2012-01-09 DIAGNOSIS — J986 Disorders of diaphragm: Secondary | ICD-10-CM

## 2012-01-09 DIAGNOSIS — M545 Low back pain: Secondary | ICD-10-CM

## 2012-01-09 DIAGNOSIS — I10 Essential (primary) hypertension: Secondary | ICD-10-CM

## 2012-01-09 DIAGNOSIS — R0609 Other forms of dyspnea: Secondary | ICD-10-CM

## 2012-01-09 DIAGNOSIS — R0902 Hypoxemia: Secondary | ICD-10-CM

## 2012-01-09 DIAGNOSIS — I38 Endocarditis, valve unspecified: Secondary | ICD-10-CM

## 2012-01-09 DIAGNOSIS — R609 Edema, unspecified: Secondary | ICD-10-CM

## 2012-01-09 DIAGNOSIS — K8689 Other specified diseases of pancreas: Secondary | ICD-10-CM

## 2012-01-09 MED ORDER — CLONAZEPAM 0.5 MG PO TABS: 0.5000 mg | ORAL_TABLET | Freq: Two times a day (BID) | ORAL | Status: DC | PRN

## 2012-01-09 MED ORDER — ZOLPIDEM TARTRATE 10 MG PO TABS: 5.0000 mg | ORAL_TABLET | Freq: Every evening | ORAL | Status: DC | PRN

## 2012-01-09 NOTE — Progress Notes (Signed)
Subjective:     Patient ID: Katie Galvan, female   DOB: 1927-10-25, 79 y.o.   MRN: [id number]  HPI  Review of Systems    Physical Exam        Subjective:    Patient ID: Katie Galvan, female    DOB: 03/06/1927, 79 y.o.   MRN: [id number]  HPI: 62 y/o WF, mother of Katie Galvan, who moved here from [place] in 2011...  she has multiple medical problems including chronically elev right hemidiaph;  HBP;  Diastolic dysfunction & mild PulmHTN on 2DEcho;  VI & Edema;  Hx Dyspagia & gastritis;  Divertics & ?colon polyp;  Hx UTI & renal failure from dehydration 3/12;  Severe DJD- s/p R.THR/ ?CPPD/ LBP/ Chr Pain Syndrome on MS Contin;  Osteopenia;  Anemia... SEE PREV EPIC NOTES FOR EARLIER DATA>>   ~  November 02, 2010:  Post Hosp visit> she was hosp 10/7 - 10/19/10 w/ abd pain/ N/ V/ D & found to have ?colitis ?presumed infectious & resolved w/ Cipro/ Flagyl; also had CP & marked incr enzymes w/ +MB & Troponin=21; EKG reported to show diffuse STseg elev (no tracings in EChart to review); no rub on exam, felt to have myopericarditis; 2DEcho showed LV cavity dilated w/ septal apical anterior HK, & incr wall thickness c/w modLVH & mild conc hypertrophy; mildMR, normal RV;  She also developed fluid overload (off her diuretic & receiving IVF w/ the colitis) w/ BNP up to 10,000 & then diuresed w/ improvement; her prev meds were changed> Diltiazem & Vasotec stopped, given Lisinopril 2.5mg , & Lasix decr to 20mg /d...    Since disch she has retained fluid w/ 3+edema in LEs & gained>5lbs on the Lasix20 in the NH;  BP= 116/68;  We rechecked studies today:  CXR shows marked elev right hemidiaph & appears similar to her baseline film 6/12;  BNP=483;  Chems= normal;  Hg=11.0, WBC=8.2, TSH=5.77 but was norm prev (?euthyroid "sick" syndrome);  We discussed incr LASIX to 40mg /d, elevate legs, ?try support hose, & f/u in 4-6 weeks... She has appt w/ Cards tomorrow.  ~  December 07, 2010:  6wk ROV & she  has seen DrBensimhon at the CHF clinic in the interim (3 weekly visits w/ the PAs)> he endorsed her Lasix at 40mg /d and added Spironolactone 12.5mg /d as well (stopping the KCl); she is improved & wt is down 14# from last visit to 139# today;  He increased her Coreg to 6.25Bid & plans short term follow up w/ repeat 2DEcho...  She tells me she wants to stop the Oxygen & use it only at night> we ambulated her in the halls today on RA- started out 90% RA at rest (HR70), desat to 86% after 2 laps (HR96 in bigeminy); she is asked to continue the O2 w/ exercise & Qhs, ok to take it off at rest when not active...  She is still on the MSContin at 30mg Bid & not requiring any of the OxyIR> we discussed pushing her down to 30-15, the 15Bid if poss over the next interval (meds regulated by her son [name])...  Labs today look great w/ K=3.6, TCO2=33, BUN=21, Creat=0.9, BNP=119 (her best yet).  ~  February 21, 2011:  63mo ROV & Katie Galvan is stable> she saw DrBensimhon 12/12 & doing well on Lasix40, Aldactone12.5, & Coreg 6.25Bid; he repeated her 2DEcho & her LVF had recovered to 55-60% (LV cavity mildly dil, mild LVH, mild MR, PAsys=78mmHg); he felt her myopericarditis had resolved & he released her  from CHF clinic follow up...    Hx Resp Fail> chr elev right hemidiaph, on O2 w/ exerc & Qhs, Advair100 Bid & Spiriva daily; stable & rec to continue same plus incr exerc as able...    HBP> controlled on the Coreg, diuretics, & Lisinopril 2.5mg /d (off prev CCB); BP= 148/88 & she is reminded to elim salt, etc; wt is up 3# to 142# & we will monitor...    Chol> with the mult hospitalizations & rehab stays w/ numerous med reconciliations done- she has stopped her prev Mevacor20; we discussed checking f/u FLP off the statin in the future...    ORTHO> she has persistent discomfort in her hip, right shoulder, back & the chr pain syndrome currently on MSContin 30mg AM & 15mg PM; we discussed trial of decr to 15mg Bid betw now 7 f/u in 3  months...    GYN> she mentioned some vag spotting & apparently has a pessary in place since her last check in [place]; we will refer to GYN for eval...  ~  May 04, 2011:  722mo ROV & add-on for recent incr SOB; she reports fall recently w/ feet swelling now & noted incr SOB; she is on her Oxygen at 1-2L/min, and had f/u Cards 3/13 for her sys & diast heart failure but they kept her diuretics the same> on Lasix 40mg /d but she is instructed to incr to 2/d if she has extra fluid/ edema/ etc...    She also had GYN appt DrFernandez 2/13 for post menopausal bleeding, pessary for uterine prolapse x yrs (it was changed regularly in [place] but not in >37yr); DrF said her vag walls & Cx were irritated & friable, he removed pessary & Rx w/ estrace cream, she was to ret in 10d but didn't go (pessary is still out & she has not experienced any descensus so far); she will sched GYN follow up at her convenience... CXR 5/13 showed marked eventration of right hemidiaph (no change), mild cardiomeg, atherosclerotic calcif of tortuous Ao, sl congestion & Atx, etc... LABS 5/13:  Chems- ok w/ BS=125 BUN=14 Creat=0.8;  CBC- ok w/ Hg=12.3;  TSH=1.71;  BNP= 368...  ~  May 23, 2011:  3wk ROV & [name] is improved- breathing better, less SOB, no chest discomfort, and ?swelling down; she notes better appetite & wt is actually up 4#; we discussed continuing the Lasix 40mg  each AM but she may incr to 80mg  on any day that swelling doesn't go down overnight; rec regular exercise etc & she is getting PT at [facility]...  ~  September 06, 2011:  722mo ROV & post hosp check> She states it all started w/ "falls"- went to ER 07/30/11 after having been found on the floor at [facility], didn't know what happened, no apparent injury etc, ?some speech prob, no focal neuro deficits;  Labs- Hg=11.7, WBC=13.5, BUN/Cr= 30/1.3;  Urine C&S= neg;  CT Head> atrophy & sm vessel dis, no acute infarct or hem;  CXR> chr changes & elev right hemidiaph, NAD;  EKG>  SBrady, rate58, NAD;  She was ret to the NH improved...    She was Adm to [facility] 7/31 - 08/07/11 after another "fall" & by report they were occuring at night when trying to get OOB- syncopal & awakes on the floor, no recollection of the fall etc; no major trauma, no incont, not post ictal, she was mildly bradycardic on Coreg6.25Bid, no signif arrhythmia noted, felt to be prob orthostatic vs vagal;  Labs- Hg=10-11.5, WBC=10-12K, BUN/Cr= 35-21/1.03-0.86, BNP=1420, Urine+Ecoli sens  Cipro;  CDopplers> tortuous vessels, no signif stenosis, vertebrals patent w/ antegrade flow;  EKG> SBrady, rate53, NSSTTWA;  2DEcho> mild LVH, norm LVF w/ EF=65-70%, Gr1DD, calcif mitral annulus, mildly thick leaflets, mild MR, mild LA dil, mild calcif AoV leaftlets, no AS/AI;  She was disch to SNF at [facility] & meds adjusted==>They reduced Coreg3.125Bid, reduced MSContin15Bid, reduced Ambien5prn...    In the NH they f/u labs and found BUN=55, Creat=1.13 and Lasix, Aldactone, Lisinopril HELD; subseq Labs improved & restarted Aldactone25-1/2tab & Lisinopril5mg /d; final Labs 08/31/11 showed BUN=27, Creat=0.94...    Since ret to her apt she is improved- not dizzy, not postural, no recurrent falls or syncpe; Pulm & Cardiac ROS is neg & she is ambulating w/ walker, still has hip pain & c/o new right shoulder pain w/ decr ROM (may have hit it during one of her prev falls); [name] will call DrBlackman for Ortho eval;  They also mentioned decr hearing (she says OK) & I encouraged them to get Audiology eval at ENT office DrWolicki...  ~  October 09, 2011:  80mo ROV & son reports that she's continued to be weak & has lost 9# to 149# today; they feel sl better since she has cut Aldactone25 in half & stopped the Lasix40- now just using it 'prn" & none recently; we discussed need for incr exercise, daily physical activity, & she has f/u w/ her orthopedist soon...    We reviewed prob list, meds, xrays and labs> see below for updates >> she has already had  the 2013 Flu vaccine...  ~  November 15, 2011:  80mo ROV & post hosp visit>  Hosp again 10/29- 11/03/11 this time w/ diverticulitis- presented w/ LLQ pain x1d, WBC elev at 18K, CT showed sigmoid diverticulitis (wall thickening & inflamm changes); treated w/ Cipro/ Flagyl, slowly advanced diet, etc; NOTE> CT Abd also revealed hypodense mass in pancreas at the uncinate process measuring 3.7 x 1.4cm c/w cystic neoplasm; she saw Amy in GI post hosp> and was improved, they extended the cipro/ flagyl for 7d more & set her up to see DrJacobs for poss endoscopic ultrasound & bx (Note> CEA=2.7,  Ca19-9= 7.2 w/norm<35)...    She has continued close f/u in CHF clinic w/ DrBensimhon's PAs> seen 11/09/11 & they adjusted Lasix for her vol status to 40mg /d if wt>143#; and they plan 2 wk holter monitor...     She is still feeling miserable- exhausted & even too tired to shower etc; daughter-in-law wonders about depression & we offered low dose medication to see if this helps but ultimately she declined new meds and we will observe    We reviewed prob list, meds, xrays and labs> see below for updates >>   ~  January 09, 2012:  7mo ROV & Jernee has had several f/u visits w/ her specialists as noted below;  Her CC= SOB but she looks to be at baseline, comfortable & in NAD; when pressed on the symptoms she describes SOB at rest & activity, trouble getting the air "in- like I can't get a deep breath"; she specifally denies cough, sput, chest congestion, etc and has not had f/c/s; in addition she denies CP, palpit, worsening edema, PND, etc but she doesn't like to lie flat- I offered hosp bed for positioning but she declines; note that exam is clear- elev right hemidiaph w/o change but no wheezing, rales, rhonchi, & cardiac exam unchanged as well... We decided to treat her symptom w/ low dose Klonopin 0.25mg - 1/2 tab twice  daily as needed for the dyspnea...    She has had several f/u visits in the CHF clinic- last 01/01/12 for her  mixed sys/diastolic HF> last 2DEcho 8/13 showed recovery w. EF=65-70%, Gr1DD, mildMR, mild LAdil; they changed Coreg to BISOPROLOL5mg , and added ALDACTONE 25mg /d; they are checking blood work at home 7 she doesn't want additional labs from [place] today; she is rec to continue f/u in their clinic...    She has VI, stasis changes and chronic bilat LE leg wounds L>R; they are been attended by the visiting nurses & [facility] staff...    She saw DrJacobs for GI 11/13> Diverticulitis resolved, remote colonoscopy reported normal, the CT mass in the pancreas appeared to be similar to prev scan (10/12 CT Abd report "low attenuation along the uncinate process is nonspecific"- no change per DrJacobs & he felt this was likely a benign lesion & he did not favor aggressive work up or Bx- plans f/u CT in 6 months...    She had a 17mo f/u appt w/ DrMohammed due to her anemia (ACD +Fe defic)> he noted Hg= 8.5-9.3 during her diverticulitis hosp; Hg was improved to 10.7 on oral Fe supplement- he rec continued oral Iron & f/u prn... We reviewed prob list, meds, xrays and labs> see below for updates >> we signed for a handicap sticker...   HOSPITALIZATIONS: ~  [facility] 11/3 - 11/08/09 after fall at home w/ comminuted left wrist fx & had surg by DrKuzma ~  [facility] 3/11 - 03/17/10 w/ UTI, dehydration & renal insuffic> back to norm w/ hydration; also anemic, Fe defic & GI eval DrDBrodie w/ HH/ gastritis ~  [facility] - 04/18/10 by DrBlackman for right THR & required a revision of the acetabular component 4d after the initial surg... ~  [facility] 6/2 - 06/13/10 w/ hypoxemic acute resp failure precipitated by fluid overload & diastolic CHF (BNP=2000) in assoc w/ her chr elev right hemidiaph, RLL atx/ scarring, & scoliosis w/ multilevel degen disc dis(w/ resultant restrictive lung disease)... ~  [facility] 10/7 - 10/19/10 w/ abd pain/ N/ V/ D & found to have ?colitis ?presumed infectious & resolved w/ Cipro/ Flagyl; also had CP & abn EKG/ Enz felt to be a  myopericarditis... ~  [facility] 7/31 - 08/07/11 w/ fall/syncope ?etiology- felt to be poss orthostatic vs vagal; Neuro & CV evals were unrevealing, meds adjusted & improved... ~  [facility] 10/29- 11/03/11 w/ diverticulitis- presented w/ LLQ pain x1d, WBC elev at 18K, CT showed sigmoid diverticulitis (wall thickening & inflamm changes); treated w/ Cipro/ Flagyl, slowly advanced diet, etc; GI extended the course of antibiotics for 1 more week; also has cystic pancreatic mass & referred to [facility] for EUS, pos bx.          Problem List:    ACUTE ON CHRONIC RESP INSUFFICIENCY>  Precipitated 6/12 by diastolic CHF superimposed on her chronic resp problems etc... Now on ADVAIR100 Bid, SPIRIVA daily, O2 2L/ min... DIAPHRAGMATIC DISORDER (ICD-519.4) - she has a chronically elev right hemidiaph, apparently idiopathic; and she is mostly asymptomatic w/o cough, phlegm, hemoptysis, ch in SOB, etc... ~  11/11:  CXR showed calcif Ao, elev right hemidiaph, mild scarring at bases, NAD... ~  6/12:  Presented to ER w/ hypoxemic resp failure due to vol overload assoc w/ her restrictive lung dis & chr pain syndrome requiring narcotic analgesics... ~  7/12:  O2 sats improved & OK to cut back the Oxygen to prn... ~  10/12:  Hosp w/ ?colitis and ?myopericarditis>  developed fluid retention, CHF, abn EKG/ Enz & 2DEcho> seen by [facility] ==> now followed in the CHF clinic/ DrBensimhon & back on Oxygen regularly ==> weaned to exerc & Qhs. ~  5/13:  She is feeling better & wonders if she needs the O2; offered to check ambulatory O2 here & poss ONO at [facility] but she wants to wait... ~  7/13:  CXR showed chr changes & elev right hemidiaph, NAD... ~  11/13:  CXR showed borderline heart size & calcif Ao, elev right hemidiaph, right base atx, NAD...  Hx HYPERTENSION (ICD-401.9) VALVULAR HEART DISEASE (ICD-424.90) > prev trivial AI, mild MR... Hx of CARDIAC ARRHYTHMIA (ICD-427.9)  EPISODE of MYOPERICARDITIS w/ decr LVF> see 10/12  Hosp... ~  she was on ASA81, Diltiazem240, Vasotec20, & LASIX 40mg /d; but meds changed during the 10/12 Hosp to ASA 81mg /d, LISINOPRIL 2.5mg /d, LASIX 20mg /d ==> freq med adjustments/ titrations from DrBensimhon CHF clinic... ~  11/11 & 6/12:  EKG showed NSR, NSSTTWA, NAD... ~  2DEcho 6/12 showed mild LVH, norm LVF w/ EF=55-60%, Gr 1 DD, trivial AI, heavily calcif mitral annulus & mild MR, PAsys est . ~  10/12:  SEE ABOVE + EChart records... Hosp w/ ?myopericarditis, elev enz, abn 2DEcho & meds adjusted> now followed freq via the CHF clinic/ DrBensimhon w/ freq med adjustments... ~  12/12:  Now much improved on COREG 6.25Bid, LISINOPRIL 2.5mg /d, LASIX 40mg /d, SPIRONOLACTONE 12.5mg /d; wt down to 139# & BNP= 119... ~  Repeat 2DEcho 12/12 showed LVF recovered to 55-60% (LV cavity mildly dil, mild LVH, mild MR, PAsys=52mmHg). ~  5/13: presents w/ incr SOB & eval shows wt=149# & BNP= 368 despite Lasix40 & Aldactone12.5; BP is also elev at 178/88; rec to increase Lasix to 80 for a few days ==> she reports improved and back to baseline...  ~  EKG 7/13 showed SBrady, rate53, minor NSSTTWA, otherw ok... ~  8/13: she was [facility] w/ fall/syncope ?etiology> 2DEcho showed mild LVH, norm LVF w/ EF=65-70%, Gr1DD, calcif mitral annulus, mildly thick leaflets, mild MR, mild LA dil, mild calcif AoV leaftlets, no AS/AI; now on ASA81, COREG6.25-1/2Bid, LISINOPRIL5, ALDACTONE25-1/2daily... ~  CDopplers 8/13 showed TDS due to anatomy & tortuosity- no signif extracranial carotid dis, vertebrals are patent & antegrade... ~  10/13:  BP= 110/80 & they are reminded to bring up-to-date list of all meds to every visit... ~  Holter Monitor per DrBensimhon 11/13 showed NSR w/ PACs, no signif arrhythmia... ~  1/14:  BP= 128/66 & she notes some dyspnea but denies CP, palpit,, etc...  VENOUS INSUFFICIENCY >> STASIS CHANGES & LEG ULCERS >> LEG EDEMA, CHRONIC (ICD-782.3) - she has severe venous stasis changes and chronic edema,  thickened skin over LE's etc... she knows to be careful w/ hygiene, no salt, elevation, support hose, etc ~  8/12:  Stable on Lasix40, 1+chr edema, wt=137# ~  10/12:  Post hosp check on Lasix20, 3+edema, BNP=483, wt=152#; rec to incr Lasix to 40mg /d... ~  12/12:  meds adjusted as above via CHF clinic & now K=3.6, TCO2=33, BUN=21, Creat=0.9, BNP=119 (her best yet)... ~  5/13:  We discussed adjusting Lasix to 40mg  daily & 80mg  prn for swelling that doesn't go down overnight... ~  8/13:  Post hosp meds adjusted & off Lasix... ~  11/13:  Cards adjusted Lasix to 40mg  prn weight >143# ~  1/14:  Bilat LE ulcers treated by Visiting nurses & [facility] staff w/ dressing changes etc...  HYPERCHOLESTEROLEMIA (ICD-272.0) - she has been on Mevacor 20mg /d from  her [place] physician... ~  Perhaps we can try to get an FLP at [facility] since we cannot get her in for Fasting labs. ~  FLP during the 10/12 hosp showed TChol 146, TG 98, HDL 53, LDL 73 ~  Due to numerous Stallings, Rehab stays in [place], & mult med reconciliations- her Mevacor was dropped along the way; wants to leave it off & check FLP on diet alone.  OTHER DYSPHAGIA (ICD-787.29) - she was on PRILOSEC 20mg /d & reports occas dysphagia but denies choking etc... she apparently has never had an Endoscopy or GI eval for this problem, "I have to be careful when I eat"... ~  6/12:  NOTE she had EGD 3/12 showing HH, gastritis, neg HPylori> rec increase PPI to Bid. ~  10/12:  She was discharge off her PPI therapy ==> she denies reflux symptoms. ~  8/13:  She states stable w/o abd pain, n/v, dysphagia, etc...  DIVERTICULOSIS OF COLON (ICD-562.10) Hx of COLONIC POLYPS (ICD-211.3) - she tells me that prev colonoscopy in [place] showed divertics & ?polyp but she does not recall any details...  she denies much constipation despite the narcotic analgesics- uses prunes as needed. ~  [facility] 10/12 w/ ?infectious colitis> resolved after Cipro/ Flagyl Rx ==> uses Miralax prn due  to narcotics. ~  [facility] 10/13 w/ diverticulitis- presented w/ LLQ pain x1d, WBC elev at 18K, CT showed sigmoid diverticulitis (wall thickening & inflamm changes); treated w/ Cipro/ Flagyl, slowly advanced diet, etc; GI extended the course of antibiotics for 1 more week; also has cystic pancreatic mass & referred to [facility] for EUS, pos bx.  CYSTIC PANCREATIC MASS >> seen on CT Abd 10/13 when adm for diverticulitis; referred to [facility] for outpt evaluation & seen ~  CT Abd 10/13 showed hypodense mass at the uncinate process of the pancreas felt to represent a cystic neoplasm; mult other findings- sigmoid divertic colitis, atrophic kidneys w/ cysts and 5mm stone in lower pole of left kidney, atherosclerosis...  DEGENERATIVE JOINT DISEASE (ICD-715.90) - she has severe osteoarthritis w/ XRays 11/11 showing severe osteopenia, severe right hip degeneration & relative sparing of the left hip joint;  DJD knees w/ ?CPPD, abn patella;  left wrist fx- radial  head & ulnar styloid... she had surg left wrist by DrKKuzma... ~  4/12:  DrBlackman did right THR but required revision of acetabular component 4d later> went to [facility] for rehab... ~  DrBlackman continues to follow w/ shots as needed for bursitis she says... ~  10/12:  She is c/o right shoulder pain & decr ROM, she wonders if this might be the cause of her chest discomfort; she will f/u w/ Ortho for XRays & shot... ~  8/13:  She is again c/o right shoulder pain & wonders if she injured it during one of her falls; she will f/u w/ DrBlackman for eval...  GYN >>  ~  2/13: pt mentioned some spotting & apparently has a pessary in place since her days in [place]; she is in need of GYN eval & check up & we will refer... ~  2/13: she saw DrFernandez for GYN> said her vag walls & Cx were irritated & friable, he removed pessary & Rx w/ estrace cream, she was to ret in 10d but didn't go (pessary is still out & she has not experienced any descensus so far); she  will sched GYN follow up at her convenience...  Hx of BACK PAIN, LUMBAR (ICD-724.2) CHRONIC PAIN SYNDROME (ICD-338.4) - she has  chronic pain- mostly from her arthritic condition- and was started on MSContin by her LMD in [place] in the summer of 2011> dose slowly increased & she was on 90mg  Bid (taking a 60mg  tab + 30mg  tab Bid)... she does not believe that this med has anything to do w/ her fall & wrist fx, or her complaints of decr memory etc... we discussed the need to wean off this medication & try to deal w/ her arthritis pain thru an Orthopedist or poss a Pain Clinic... ~  11/11:  she notes pain worse during the day while up & about> try to decr MSContin to 90mg AM & 60mg PM... ~  1/12 & 4/12:  she is down to 60mg Bid & we discussed weaning further ==> she was down to 45mg  (30+15) Bid by the time of her right THR... ~  6/12:  Recommend that she try to wean further> try 45am & 30pm... ~  7/12:  She's down to 30mg  Bid & will wean further as able... ~  10/12:  She remains on the MSContin at 30mg Bid... ~  12/12:  We discussed further slow wean down to 30-15 first... ~  2/13:  We discussed trying to wean down to 15mg  Bid over the next few months, but she was unable to do so; states that DrBlackman feels it's due to her back... ~  8/13:  She is down to MSContin 15mg Bid since her recent hosp... ~  11/13:  Hospitalists changed her MSContin to OXYCODONE & she was discharged on this...  OSTEOPENIA (ICD-733.90) - XRays here showed severe osteopenia... she notes that she used to be on Fosamax but ?how long? & when stopped?... she states that she was told her prev BMD was "good"... she has not been taking calcium supplements, but was prev on some OTC Vit D supplements... asked to resume Calcium, Women's MVI, Vit D 1000u daily...  Hx of DEPRESSION (ICD-311) - on CELEXA 20mg /d and she wants to continue this med... INSOMNIA >> on AMBIEN 10mg - 1/2 tab as needed...  ANEMIA, MILD (ICD-285.9) - Hg post op 11/11  wrist fx surg = 11.2 ~  6/12:  Labs showed Hg= 9.1, MCV= 77, Fe= 15 (7%sat), & she has B pos blood> we decided to incr Fe to Bid (may need IV Fe if not responding). ~  7/12:  Labs showed Hg= 10.0, MCV= 76, she reports stool checks at [facility] were NEG... ~  8/12: she reports that she had f/u Heme eval DrMohammed & "he released me" on Fe + VitC daily... ~  10/12:  Labs post hosp showed Hg= 11.0, MCV= 87 ~  Labs 5/13 showed Hg= 12.3 ~  10/13:  Labs during her Hosp for Diverticulits showed Hg down to 8.5; Disch on Fe supplementation... ~  11/13: she had f/u DrMohammed & Hg up to 10.7, rec to continue Fe...   Past Surgical History  Procedure Date  . Appendectomy   . Breast surgery   . Right wrist surgery   . Bilateral cataracts   . Hip surgery   . Tonsillectomy   . Adenoidectomy     Outpatient Encounter Prescriptions as of 01/09/2012  Medication Sig Dispense Refill  . aspirin 81 MG EC tablet Take 81 mg by mouth daily.        . bisoprolol (ZEBETA) 5 MG tablet Take 1 tablet (5 mg total) by mouth daily.  30 tablet  3  . Calcium Carbonate-Vit D-Min (CALTRATE 600+D PLUS) 600-400 MG-UNIT per tablet Chew 1 tablet by mouth 2 (  two) times daily.       . cholecalciferol (VITAMIN D) 1000 UNITS tablet Take 2,000 Units by mouth daily.      . citalopram (CELEXA) 20 MG tablet Take 1 tablet (20 mg total) by mouth daily.  30 tablet  11  . clotrimazole-betamethasone (LOTRISONE) cream Apply 1 application topically 2 (two) times daily as needed. For rashes on forehead.      . ferrous sulfate 325 (65 FE) MG tablet Take 325 mg by mouth 2 (two) times daily.        . Fluticasone-Salmeterol (ADVAIR DISKUS) 100-50 MCG/DOSE AEPB Inhale 1 puff into the lungs every 12 (twelve) hours.  60 each  11  . furosemide (LASIX) 40 MG tablet Take 1 tablet (40 mg total) by mouth daily.  35 tablet  6  . lisinopril (PRINIVIL,ZESTRIL) 5 MG tablet Take 1 tablet (5 mg total) by mouth daily.  30 tablet  11  . loratadine (CLARITIN) 10  MG tablet Take 10 mg by mouth daily.        Marland Kitchen morphine (MS CONTIN) 15 MG 12 hr tablet 15 mg by Per post-pyloric tube route. Take 15mg  in the morning and 10mg  at night      . Multiple Vitamin (MULTIVITAMIN WITH MINERALS) TABS Take 1 tablet by mouth daily.      . Naproxen Sodium (ALEVE) 220 MG CAPS Take 1 capsule by mouth 2 (two) times daily.      . OxyCODONE (OXYCONTIN) 10 mg T12A Take 1 tablet (10 mg total) by mouth every 12 (twelve) hours.  60 tablet  0  . polyethylene glycol (MIRALAX / GLYCOLAX) packet Take 17 g by mouth daily. Do not take if you have diarrhea  14 each  0  . spironolactone (ALDACTONE) 25 MG tablet Take 1 tablet (25 mg total) by mouth daily.  30 tablet  3  . tiotropium (SPIRIVA) 18 MCG inhalation capsule Place 1 capsule (18 mcg total) into inhaler and inhale daily.  30 capsule  11  . vitamin C (ASCORBIC ACID) 500 MG tablet Take 500 mg by mouth daily.        Marland Kitchen zolpidem (AMBIEN) 10 MG tablet Take 5 mg by mouth at bedtime as needed. For insomnia        Allergies  Allergen Reactions  . Sulfonamide Derivatives Swelling    Current Medications, Allergies, Past Medical History, Past Surgical History, Family History, and Social History were reviewed in [employer] record.    Review of Systems: Constitutional:  Denies F/C/S, anorexia, unexpected weight change. HEENT:  No HA, visual changes, earache, nasal symptoms, sore throat, hoarseness. Resp:  No cough, sputum, hemoptysis; mild SOB/ DOE noted... Cardio:  No CP, palpit, orthopnea;  +edema & DOE but limited mobility. GI:  Denies N/V/D, tends toward constip due to narcotics; swallowing OK & denies reflux or abd pain.  GU:  No dysuria, freq, urgency, hematuria, or flank pain. MS:  Severe DJD esp right hip w/ pain & decr ROM ==> s/p right THR now... Neuro:  No tremors, seizures, dizziness, syncope; +weakness & multifactorial gait abn. Skin:  No suspicious lesions or skin rash. Heme:  No adenopathy, bruising,  bleeding. Psyche: Denies confusion, sleep disturbance, hallucinations; +anxiety & situational depression.    Objective:  Physical Exam:  WD, WN, 79 y/o WF chr ill appearing but in NAD... Vital Signs:  Reviewed... General:  Alert & oriented; pleasant & cooperative... HEENT:  [place]/AT, EOM-wnl, PERRLA, EACs-clear, TMs-wnl, NOSE-clear, THROAT-clear & wnl. Neck:  Supple  w/ decr ROM; no JVD; normal carotid impulses w/o bruits; no thyromegaly or nodules palpated; no lymphadenopathy. Chest:  Clear to P & A w/ decr BS at right base; without wheezes/ rales/ or rhonchi heard... Heart:  Regular Rhythm; norm S1 & S2, gr 1/6 SEM w/o rubs or gallops detected... Abdomen:  Soft & nontender; normal bowel sounds; no organomegaly or masses palpated... Ext:  decrROM; +deformities & mod arthritic changes; no varicose veins, +venous insuffic & 1+edema;  Pulses intact w/o bruits... s/p right THR w/ revision; ambulates w/ walker... Neuro:  CNs intact;  No focal neuro deficits, in wheelchair & painful standing & walking... Derm:  No lesions noted; no rash etc... Lymph:  No cervical, supraclavicular, axillary, or inguinal adenopathy palpated...   RADIOLOGY DATA:  Reviewed in the EPIC EMR & discussed w/ the patient...  LABORATORY DATA:  Reviewed in the EPIC EMR & discussed w/ the patient...   Assessment & Plan:    Hx Acute on Chr Resp Failure 6/12 Hosp>  Multifactorial w/ diastolic CHF, elev right hemidiaph, atx, scoliosis, restrictive lung dis, narcotic pain meds- all playing a role... O2 sats are now improved & she can decr the oxygen to as needed at rest & 2L/min w/ exercise...  Elev right hemidiaph>  This is chronic & assoc w/ some mild basilar atx; encouraged to get deep breaths & expand well...  HBP, etc>  She has had mult adjustments in her meds in & out of Hosp & thru the CHF clinic...  Hx of Myopericarditis w/ abn EKG/ Enz & subseq vol overload w/ abn 2DEcho>  She improved back on her diuretic & meds  were adjusted in the CHF clinic; repeat 2DEcho 12/12 w/ recovery of LVF & back to her baseline... F/u 2DEcho 8/13= improved w/ EF=65-70%, Gr1DD...  Diastolic CHF>  She is now off the Lasix due to Adm w/ syncope, but subseq restarted prn if wt>143#  VI, Edema>  REC- no salt, elevate legs, TED hose, etc...  CHOL>  ?Mevacor on her list, but not currently taking, we will try to sort this out & f/u FLP later...  GERD, Dysphagia>  She had EGD during prev hosp w/ HH, gastritis & neg HPylori;  Prev on PPI Rx but off now & she feels she is OK, we will follow.  Hx of Colitis- presumed infectious>  Resolved w/ Cipro/ Flagyl prev; then diverticulitis back on these meds & followed by GI for resolution... PANCREATIC LESION>> CT Abd showed cystic pancreatic neoplasm ?etiology; DrJacobs has advocated for conservative eval & Rx... Divertics, Constip>  Related to her narcotic analgesics; improved w/ laxatives OTC...  DJD, s/p right THR, Chr Pain Syndrome> Trying to wean MSContin further which should be poss based on the fact that the painful right hip has been replaced...  Other medical problems as noted...    GYN> pt c/o spotting & has pessary in place since her last check in [place]; we will help her get a gyn appt ASAP for eval & check up...    Derm> she has a seborrheic dermatitis rash & we will Rx w/ Lotrisone cream...    Anemia> on Fe supplement...   Patient's Medications  New Prescriptions   CLONAZEPAM (KLONOPIN) 0.5 MG TABLET    Take 1 tablet (0.5 mg total) by mouth 2 (two) times daily as needed (for shortness of breath).  Previous Medications   ASPIRIN 81 MG EC TABLET    Take 81 mg by mouth daily.     BISOPROLOL (ZEBETA) 5  MG TABLET    Take 1 tablet (5 mg total) by mouth daily.   CALCIUM CARBONATE-VIT D-MIN (CALTRATE 600+D PLUS) 600-400 MG-UNIT PER TABLET    Chew 1 tablet by mouth 2 (two) times daily.    CHOLECALCIFEROL (VITAMIN D) 1000 UNITS TABLET    Take 2,000 Units by mouth daily.    CITALOPRAM (CELEXA) 20 MG TABLET    Take 1 tablet (20 mg total) by mouth daily.   CLOTRIMAZOLE-BETAMETHASONE (LOTRISONE) CREAM    Apply 1 application topically 2 (two) times daily as needed. For rashes on forehead.   FERROUS SULFATE 325 (65 FE) MG TABLET    Take 325 mg by mouth 2 (two) times daily.     FLUTICASONE-SALMETEROL (ADVAIR DISKUS) 100-50 MCG/DOSE AEPB    Inhale 1 puff into the lungs every 12 (twelve) hours.   FUROSEMIDE (LASIX) 40 MG TABLET    Take 1 tablet (40 mg total) by mouth daily.   LISINOPRIL (PRINIVIL,ZESTRIL) 5 MG TABLET    Take 1 tablet (5 mg total) by mouth daily.   LORATADINE (CLARITIN) 10 MG TABLET    Take 10 mg by mouth daily.     MORPHINE (MS CONTIN) 15 MG 12 HR TABLET    15 mg by Per post-pyloric tube route. Take 15mg  in the morning and 10mg  at night   MULTIPLE VITAMIN (MULTIVITAMIN WITH MINERALS) TABS    Take 1 tablet by mouth daily.   NAPROXEN SODIUM (ALEVE) 220 MG CAPS    Take 1 capsule by mouth 2 (two) times daily.   OXYCODONE (OXYCONTIN) 10 MG T12A    Take 1 tablet (10 mg total) by mouth every 12 (twelve) hours.   POLYETHYLENE GLYCOL (MIRALAX / GLYCOLAX) PACKET    Take 17 g by mouth daily. Do not take if you have diarrhea   SPIRONOLACTONE (ALDACTONE) 25 MG TABLET    Take 1 tablet (25 mg total) by mouth daily.   TIOTROPIUM (SPIRIVA) 18 MCG INHALATION CAPSULE    Place 1 capsule (18 mcg total) into inhaler and inhale daily.   VITAMIN C (ASCORBIC ACID) 500 MG TABLET    Take 500 mg by mouth daily.    Modified Medications   Modified Medication Previous Medication   ZOLPIDEM (AMBIEN) 10 MG TABLET zolpidem (AMBIEN) 10 MG tablet      Take 0.5 tablets (5 mg total) by mouth at bedtime as needed. For insomnia    Take 5 mg by mouth at bedtime as needed. For insomnia  Discontinued Medications   No medications on file

## 2012-01-09 NOTE — Patient Instructions (Addendum)
Today we updated your med list in our EPIC system...    Continue your current medications the same...  We reviewed all aspects of your recent shortness of breath complaints & it appears most likely to be due to a chest wall muscle problem- ie can't get a deep breath in/ don't feel satisfied breathing... For this I Rec a new med to relax the chest wall muscles & make it easier to get that deep breath> try KLONOPIN 0.25mg - 1/2 to 1 tab up to twice daily as needed for that shortness of breath feeling...  Call for any questions or any interval problems...   Let's plan a follow up visit in 3 months.Marland KitchenMarland Kitchen

## 2012-01-16 ENCOUNTER — Ambulatory Visit: Payer: Medicare Other | Admitting: Pulmonary Disease

## 2012-01-16 ENCOUNTER — Telehealth (HOSPITAL_COMMUNITY): Payer: Self-pay | Admitting: *Deleted

## 2012-01-16 ENCOUNTER — Telehealth: Payer: Self-pay | Admitting: Pulmonary Disease

## 2012-01-16 DIAGNOSIS — R531 Weakness: Secondary | ICD-10-CM

## 2012-01-16 MED ORDER — COLLAGENASE 250 UNIT/GM EX OINT: TOPICAL_OINTMENT | Freq: Every day | CUTANEOUS | Status: DC

## 2012-01-16 NOTE — Telephone Encounter (Signed)
Spoke with pt's Son He reports patient feeling more fatigued than usual for the past 2 wks She fell x 2 since last ov with SN on 01/09/11 No injury other than a scraped up elbow Son asking if [facility] can check labs on her to ensure that her iron is not too low Please advise, thanks!

## 2012-01-16 NOTE — Telephone Encounter (Signed)
Returning call can be reached at [phone].Katie Galvan

## 2012-01-16 NOTE — Telephone Encounter (Signed)
Per Misty Stanley with [facility] the leg wraps are irritating pt's legs and the wound center has recommended they change to Santyl ointment, Misty Stanley just needs an order for the [facility] and ok for this, rx sent in

## 2012-01-16 NOTE — Telephone Encounter (Signed)
LMTC x 1  

## 2012-01-16 NOTE — Telephone Encounter (Signed)
Called and spoke with Katie Galvan from [facility] and he stated that the pt may benefit from assistance with dressing, she cannot find things, and she is falling more often. [facility] can offer short term help with this but Katie Galvan felt that the pt would need long term care with daily activities.  SN please advise. thanks

## 2012-01-16 NOTE — Telephone Encounter (Signed)
Per SN---ok for [facility] to check cbc, bmp, fe/ibc panle and will need to check her stool for blood.  thanks

## 2012-01-16 NOTE — Telephone Encounter (Signed)
Pt's son aware we will send an order to [facility] so the nurse can draw labs.

## 2012-01-17 NOTE — Telephone Encounter (Signed)
She is currently in [place]...Marland KitchenMarland KitchenAHC will need to speak with the pts son mike.  Whatever it takes to keep her from falling---ex.  Home PT, has a rolling walker already.   thanks

## 2012-01-17 NOTE — Telephone Encounter (Signed)
i spoke with Thayer Ohm. He stated pt Son response is he can't make his mother do anything. He states he will speak with [name] again. Nothing further was needed

## 2012-01-17 NOTE — Telephone Encounter (Signed)
We can see if the pts son can come by here to pick up stool cards.  Pt will need to do these and return them to the lab.  thanks

## 2012-01-17 NOTE — Telephone Encounter (Signed)
Katie Galvan from [facility] called to let SN know that they are no able to do occult blood. Pls advise.

## 2012-01-18 ENCOUNTER — Telehealth: Payer: Self-pay | Admitting: Pulmonary Disease

## 2012-01-18 DIAGNOSIS — I5020 Unspecified systolic (congestive) heart failure: Secondary | ICD-10-CM

## 2012-01-18 DIAGNOSIS — N390 Urinary tract infection, site not specified: Secondary | ICD-10-CM

## 2012-01-18 NOTE — Telephone Encounter (Signed)
Per SN---  Labs show some mild anemai   Hg 11.2.  Not low enough for symptoms.  Chemistries are ok, rec to start on fesol 325 mg  1 daily along with vit c 500mg .   SN feels that the pt would benefit from rehab at [facility].  thanks

## 2012-01-18 NOTE — Telephone Encounter (Signed)
Pt's son states that the pt is still very weak and has had a few episodes where she loses her balance when  Ambulating. He would like to know if the blood work showed any abnormalities and depending on those results, would she benefit from rehab at [facility]. Pls advise. Allergies  Allergen Reactions  . Sulfonamide Derivatives Swelling

## 2012-01-18 NOTE — Telephone Encounter (Signed)
Pt's son says the pt is already taking iron 325 mg BID and Vit C 500 mg QD. He wants to know if a transfusions would help because this was done once before. SN, pls advise.

## 2012-01-19 ENCOUNTER — Other Ambulatory Visit (INDEPENDENT_AMBULATORY_CARE_PROVIDER_SITE_OTHER): Payer: Medicare Other

## 2012-01-19 ENCOUNTER — Other Ambulatory Visit: Payer: Self-pay | Admitting: Pulmonary Disease

## 2012-01-19 DIAGNOSIS — N390 Urinary tract infection, site not specified: Secondary | ICD-10-CM

## 2012-01-19 LAB — URINALYSIS, ROUTINE W REFLEX MICROSCOPIC
Nitrite: POSITIVE
Specific Gravity, Urine: 1.02 (ref 1.000–1.030)
Urobilinogen, UA: 0.2 (ref 0.0–1.0)

## 2012-01-19 MED ORDER — CIPROFLOXACIN HCL 250 MG PO TABS: 250.0000 mg | ORAL_TABLET | Freq: Two times a day (BID) | ORAL | Status: DC

## 2012-01-19 NOTE — Telephone Encounter (Signed)
Accidentally closed this note.   Per SN----ok with SN to have the ua and urine culture done asap.  They will need to get these results to SN asap.    Or he could take her to the ER for a full assessment.  thanks

## 2012-01-19 NOTE — Telephone Encounter (Signed)
Called and spoke with pts son mike and he is aware that the urine test was + for UTI and per SN--start on cipro 250 mg  1 po bid  #14.  This has been sent to the pts pharmacy and mike is aware.  Nothing further is needed.

## 2012-01-19 NOTE — Addendum Note (Signed)
Addended by: Gweneth Dimitri D on: 01/19/2012 09:28 AM   Modules accepted: Orders

## 2012-01-19 NOTE — Telephone Encounter (Signed)
Tijana Walder called back this morning. He wants to speak to nurse "asap"- he says home health nurse told him that per recent labs pt's WBC was elevated- he believes this may be what's causing issues with mom - also wants to know if a UA needs to be set up today- if so, he needs this done "asap this am" as he is going out of town and isn't comfortable leaving mom until this is all settled. Also feels she may need an abx called in as well. [phone] (his #). Hazel Sams

## 2012-01-19 NOTE — Telephone Encounter (Signed)
Called, spoke with Katie Galvan.  Informed him of below per SN.  Katie Galvan states he would like to bring her into the office to have the UA an urine culture done today ASAP.  Advised I have placed orders for urine tests as STAT.  Katie Galvan verbalized understanding and states they will bring pt in before lunch today.  He would like rx called in TODAY if positive for UTI.  Will route to [facility] as a reminder to follow up on results today.  Thank you.

## 2012-01-22 LAB — URINE CULTURE: Colony Count: >=100000

## 2012-01-23 ENCOUNTER — Telehealth: Payer: Self-pay | Admitting: Pulmonary Disease

## 2012-01-23 NOTE — Telephone Encounter (Signed)
I spoke with Thayer Ohm and advised him of this. He asked that we call and speak with his son [name] and advise him of the same.  I spoke with [name]. He stated he see's pt one way and chris see's pt another way. He states he is going to see pt tonight and see how she is doing. If need be he will have someone at [facility] go and give her medication that she needs when she needs it. He is also requesting pt's urine culture results. [name] seems to think the problem is pt is not taking her ABX correctly and the infection is what is affecting her memory. Please advise thanks

## 2012-01-23 NOTE — Telephone Encounter (Signed)
I spoke with Katie Galvan. He stated pt fell again this morning. She is fine has no broken bones. Her weight is about 142 and BP 100/62. Pt admits she is afraid to get up and walk at all now. She is basically alone all the time and has very little assistance. She is in an independent living assistance but gets not help at all. Pt does not remember to take her medications, etc. She is needing assistance. Please advise SN thanks

## 2012-01-23 NOTE — Telephone Encounter (Signed)
Per SN----he agrees.   Pt will need AL vs SNF.   thanks

## 2012-01-23 NOTE — Progress Notes (Signed)
Quick Note:  ATC patient no answer [id number] ______ 

## 2012-01-24 MED ORDER — CIPROFLOXACIN HCL 250 MG PO TABS: 250.0000 mg | ORAL_TABLET | Freq: Two times a day (BID) | ORAL | Status: DC

## 2012-01-24 NOTE — Telephone Encounter (Signed)
Called spoke with patient's son [name], advised SN okayed for another round of cipro.  [name] requested this be sent to [facility].  Done.  Will sign off.  Also, [name] stated that he agrees with a rehab facility but that pt is reluctant.  Advised we will be happy to discuss with patient if need be.

## 2012-01-24 NOTE — Telephone Encounter (Signed)
Per SN---ok for another 7 days of cipro 250 mg  #14  1 po bid  If he thinks it will help.  thanks

## 2012-01-24 NOTE — Telephone Encounter (Signed)
Called spoke with patient's son [name] and advised of SN's recs below w/ the urine culture results and recs regarding her cipro rx from 1.17.14.  However, [name] is now wondering if pt needs an extension on her cipro (was given #14 tabs for 7 day rx; has 2 days left) because "her strength is not back to where is was before."  Warrick Parisian that abx can typically take the full course before a real improvement is noted, and that pt's "strength" will take longer to get back to baseline.  Asked if pt's other symptoms had improved with the abx; he asked her (she was sitting beside him) and pt responded that she does feel better.  [name] is persistent about asking SN if pt needs an extension on her abx; also wants to know if another urine cx is in order.  Dr [name] please advise, thanks.

## 2012-01-24 NOTE — Telephone Encounter (Signed)
Per SN---the urine culture showed ecoli  And this germ is sensitive to the cipro that was given to her and the cipro will clear this up.   If any other questions or concerns let us know.  thanks

## 2012-01-25 ENCOUNTER — Telehealth: Payer: Self-pay | Admitting: Pulmonary Disease

## 2012-01-25 ENCOUNTER — Encounter: Payer: Self-pay | Admitting: Internal Medicine

## 2012-01-25 NOTE — Telephone Encounter (Signed)
Would like to know if pt should be admitted to [facility] instead.  Antionette Fairy

## 2012-01-25 NOTE — Telephone Encounter (Signed)
fl2 signed  by SN and has been faxed back to [facility].    i called and spoke with mike and he is aware that this has been faxed back.  He stated that they will get her admitted tomorrow.  [name] is to call back if anything further is needed.

## 2012-01-25 NOTE — Telephone Encounter (Signed)
I spoke with the pt son and he states that they are not seeing any improvement in the pt mental or physical abilities on the Cipro. He states she is not as "sharp" as she usually is, and that she is not getting around like she usually does. He states that they do not feel like these issues are due to the UTI since she has been on cipro since 01/19/12. [name] wants to know should they continue with getting her admitted to [facility] rehab center for a few weeks or should they take her to [facility] for further tests since she is no better. Please advise. Carron Curie, CMA

## 2012-01-25 NOTE — Telephone Encounter (Signed)
Duplicate message. 

## 2012-01-25 NOTE — Telephone Encounter (Signed)
Will get the fl2 form completed and faxed over to facility

## 2012-01-25 NOTE — Telephone Encounter (Signed)
I spoke with pt and is aware of SN recs. He stated he has already contacted them and was advised they need FL2 and medication list. Pt is already admitted. Needs this faxed to [phone]. Please advise SN thanks

## 2012-01-25 NOTE — Telephone Encounter (Signed)
Per SN----there isnt much that could be done at [facility] since this is not an acute problem.  SN suggests rehab admit to [facility] for the pt. thanks

## 2012-02-05 ENCOUNTER — Emergency Department (HOSPITAL_COMMUNITY)
Admission: EM | Admit: 2012-02-05 | Discharge: 2012-02-05 | Disposition: A | Discharge status: Skilled Nursing Facility | Payer: Medicare Other | Attending: Emergency Medicine | Admitting: Emergency Medicine

## 2012-02-05 ENCOUNTER — Emergency Department (HOSPITAL_COMMUNITY): Payer: Medicare Other

## 2012-02-05 ENCOUNTER — Encounter (HOSPITAL_COMMUNITY): Payer: Self-pay | Admitting: *Deleted

## 2012-02-05 DIAGNOSIS — R609 Edema, unspecified: Secondary | ICD-10-CM | POA: Insufficient documentation

## 2012-02-05 DIAGNOSIS — E785 Hyperlipidemia, unspecified: Secondary | ICD-10-CM | POA: Insufficient documentation

## 2012-02-05 DIAGNOSIS — S0990XA Unspecified injury of head, initial encounter: Secondary | ICD-10-CM | POA: Insufficient documentation

## 2012-02-05 DIAGNOSIS — W1809XA Striking against other object with subsequent fall, initial encounter: Secondary | ICD-10-CM | POA: Insufficient documentation

## 2012-02-05 DIAGNOSIS — I5020 Unspecified systolic (congestive) heart failure: Secondary | ICD-10-CM | POA: Insufficient documentation

## 2012-02-05 DIAGNOSIS — C4490 Unspecified malignant neoplasm of skin, unspecified: Secondary | ICD-10-CM | POA: Insufficient documentation

## 2012-02-05 DIAGNOSIS — G894 Chronic pain syndrome: Secondary | ICD-10-CM | POA: Insufficient documentation

## 2012-02-05 DIAGNOSIS — Z79899 Other long term (current) drug therapy: Secondary | ICD-10-CM | POA: Insufficient documentation

## 2012-02-05 DIAGNOSIS — W19XXXA Unspecified fall, initial encounter: Secondary | ICD-10-CM

## 2012-02-05 DIAGNOSIS — I38 Endocarditis, valve unspecified: Secondary | ICD-10-CM | POA: Insufficient documentation

## 2012-02-05 DIAGNOSIS — K5730 Diverticulosis of large intestine without perforation or abscess without bleeding: Secondary | ICD-10-CM | POA: Insufficient documentation

## 2012-02-05 DIAGNOSIS — F3289 Other specified depressive episodes: Secondary | ICD-10-CM | POA: Insufficient documentation

## 2012-02-05 DIAGNOSIS — D649 Anemia, unspecified: Secondary | ICD-10-CM | POA: Insufficient documentation

## 2012-02-05 DIAGNOSIS — I872 Venous insufficiency (chronic) (peripheral): Secondary | ICD-10-CM | POA: Insufficient documentation

## 2012-02-05 DIAGNOSIS — F329 Major depressive disorder, single episode, unspecified: Secondary | ICD-10-CM | POA: Insufficient documentation

## 2012-02-05 DIAGNOSIS — I10 Essential (primary) hypertension: Secondary | ICD-10-CM | POA: Insufficient documentation

## 2012-02-05 DIAGNOSIS — I499 Cardiac arrhythmia, unspecified: Secondary | ICD-10-CM | POA: Insufficient documentation

## 2012-02-05 DIAGNOSIS — M1990 Unspecified osteoarthritis, unspecified site: Secondary | ICD-10-CM | POA: Insufficient documentation

## 2012-02-05 DIAGNOSIS — Y9301 Activity, walking, marching and hiking: Secondary | ICD-10-CM | POA: Insufficient documentation

## 2012-02-05 DIAGNOSIS — J986 Disorders of diaphragm: Secondary | ICD-10-CM | POA: Insufficient documentation

## 2012-02-05 DIAGNOSIS — Y92009 Unspecified place in unspecified non-institutional (private) residence as the place of occurrence of the external cause: Secondary | ICD-10-CM | POA: Insufficient documentation

## 2012-02-05 NOTE — ED Notes (Signed)
[id number]<[id number]> Expected date:<BR> Expected time:<BR> Means of arrival:<BR> Comments:<BR> Ems/fall

## 2012-02-05 NOTE — ED Notes (Signed)
Pt states last night she fell coming out the bathroom and hit the back of her head on hardwood floor, pt denies headache or pain, states lower part of back of head tender to touch, family states nurse from [facility] called and states pt was having tremors and was confused, family states pt is talking more now and alert then was earlier today. Pt alert and able to answers questions appropriately.

## 2012-02-05 NOTE — ED Notes (Signed)
Pt and family made aware pt being discharged back to [facility], [place] called to transport pt back.

## 2012-02-05 NOTE — ED Provider Notes (Signed)
History     CSN: [id number]  Arrival date & time 02/05/12  1338   First MD Initiated Contact with Patient 02/05/12 1342      Chief Complaint  Patient presents with  . Fall     HPI Pt fell last night and hit head. Today family noticed pt not acting her normal self. Not talking as much. Pt also currently being tx for UTI  Past Medical History  Diagnosis Date  . Disorders of diaphragm   . Unspecified essential hypertension   . Endocarditis, valve unspecified, unspecified cause   . Cardiac dysrhythmia, unspecified   . Unspecified venous (peripheral) insufficiency   . Edema   . Pure hypercholesterolemia   . Other dysphagia   . Diverticulosis of colon (without mention of hemorrhage)   . Benign neoplasm of colon   . Osteoarthrosis, unspecified whether generalized or localized, unspecified site   . Lumbago   . Chronic pain syndrome   . Disorder of bone and cartilage, unspecified   . Depressive disorder, not elsewhere classified   . Anemia, unspecified   . Skin cancer   . Systolic heart failure     echo 10/10/10 EF 30%  . Hiatal hernia   . Gastritis     Past Surgical History  Procedure Date  . Appendectomy   . Breast surgery   . Right wrist surgery   . Bilateral cataracts   . Hip surgery   . Tonsillectomy   . Adenoidectomy     Family History  Problem Relation Age of Onset  . Diabetes Mother   . Heart disease Father   . Lung cancer Sister     History  Substance Use Topics  . Smoking status: Never Smoker   . Smokeless tobacco: Never Used  . Alcohol Use: No    OB History    Grav Para Term Preterm Abortions TAB SAB Ect Mult Living   2 2 1 1      2       Review of Systems All other systems reviewed and are negative Allergies  Sulfonamide derivatives  Home Medications   Current Outpatient Rx  Name  Route  Sig  Dispense  Refill  . ACLIDINIUM BROMIDE 400 MCG/ACT IN AEPB   Inhalation   Inhale 1 puff into the lungs 2 (two) times daily.          Marland Kitchen  ALPRAZOLAM 0.25 MG PO TABS   Oral   Take 0.25 mg by mouth every 8 (eight) hours as needed. Anxiety or agitation.         Marland Kitchen AMPICILLIN 500 MG PO CAPS   Oral   Take 500 mg by mouth every 8 (eight) hours. For 7 days.         . ASPIRIN 81 MG PO TBEC   Oral   Take 81 mg by mouth daily.           Marland Kitchen BISOPROLOL FUMARATE 5 MG PO TABS   Oral   Take 2.5 mg by mouth daily.         . BUDESONIDE-FORMOTEROL FUMARATE 160-4.5 MCG/ACT IN AERO   Inhalation   Inhale 1 puff into the lungs 2 (two) times daily.         Marland Kitchen CALTRATE 600+D PLUS 600-400 MG-UNIT PO CHEW   Oral   Chew 1 tablet by mouth 2 (two) times daily.          Marland Kitchen VITAMIN D 1000 UNITS PO TABS   Oral   Take 2,000  Units by mouth daily.         Marland Kitchen CITALOPRAM HYDROBROMIDE 20 MG PO TABS   Oral   Take 1 tablet (20 mg total) by mouth daily.   30 tablet   11   . FERROUS SULFATE 325 (65 FE) MG PO TABS   Oral   Take 325 mg by mouth 2 (two) times daily.           . FUROSEMIDE 40 MG PO TABS   Oral   Take 1 tablet (40 mg total) by mouth daily.   35 tablet   6   . LISINOPRIL 5 MG PO TABS   Oral   Take 2.5 mg by mouth daily.         Marland Kitchen LORATADINE 10 MG PO TABS   Oral   Take 10 mg by mouth daily.           . MORPHINE SULFATE ER 15 MG PO TBCR   Oral   Take 15 mg by mouth every morning. Take 15mg  in the morning and 10mg  at night         . MORPHINE SULFATE 15 MG PO TABS   Oral   Take 15 mg by mouth every 8 (eight) hours as needed. Pain.         . ADULT MULTIVITAMIN W/MINERALS CH   Oral   Take 1 tablet by mouth daily.         Marland Kitchen NAPROXEN SODIUM 220 MG PO CAPS   Oral   Take 1 capsule by mouth 2 (two) times daily.         Marland Kitchen ALIGN 4 MG PO CAPS   Oral   Take 1 capsule by mouth every morning.         Marland Kitchen SPIRONOLACTONE 25 MG PO TABS   Oral   Take 1 tablet (25 mg total) by mouth daily.   30 tablet   3   . VITAMIN C 500 MG PO TABS   Oral   Take 500 mg by mouth daily.           Marland Kitchen ZINC GLUCONATE  50 MG PO TABS   Oral   Take 100 mg by mouth every morning.         Marland Kitchen CIPROFLOXACIN HCL 250 MG PO TABS   Oral   Take 1 tablet (250 mg total) by mouth 2 (two) times daily.   14 tablet   0   . OXYCODONE HCL ER 10 MG PO T12A   Oral   Take 1 tablet (10 mg total) by mouth every 12 (twelve) hours.   60 tablet   0   . ZOLPIDEM TARTRATE 10 MG PO TABS   Oral   Take 5 mg by mouth at bedtime. Sleep.           BP 110/50  Pulse 65  Temp 97.8 F (36.6 C) (Oral)  Resp 21  SpO2 97%  Physical Exam  Nursing note and vitals reviewed. Constitutional: She is oriented to person, place, and time. She appears well-developed and well-nourished. No distress.  HENT:  Head: Normocephalic and atraumatic.    Eyes: Pupils are equal, round, and reactive to light.  Neck: Normal range of motion.  Cardiovascular: Normal rate and intact distal pulses.   Pulmonary/Chest: No respiratory distress.  Abdominal: Normal appearance. She exhibits no distension. There is no tenderness. There is no rebound.  Musculoskeletal: Normal range of motion.  Neurological: She is alert and oriented to person, place,  and time. No cranial nerve deficit. She exhibits normal muscle tone.  Skin: Skin is warm and dry. No rash noted.  Psychiatric: She has a normal mood and affect. Her behavior is normal.    ED Course  Procedures (including critical care time)  Labs Reviewed - No data to display Ct Head Wo Contrast  02/05/2012  *RADIOLOGY REPORT*  Clinical Data: Fall.  Head injury.  Headache.  CT HEAD WITHOUT CONTRAST  Technique:  Contiguous axial images were obtained from the base of the skull through the vertex without contrast.  Comparison: 07/30/2011  Findings: The brain stem, cerebellum, cerebral peduncles, thalami, basal ganglia, basilar cisterns, and ventricular system appear unremarkable.  Periventricular and corona radiata white matter hypodensities are most compatible with chronic ischemic microvascular white matter  disease.  No intracranial hemorrhage, mass lesion, or acute infarction is identified.  Atherosclerotic calcification of the carotid siphons noted.  IMPRESSION:  1.  No acute intracranial findings. 2. Periventricular and corona radiata white matter hypodensities are most compatible with chronic ischemic microvascular white matter disease.   Original Report Authenticated By: Gaylyn Rong, M.D.      1. Fall   2. Head injury       MDM          Nelia Shi, MD 02/05/12 [phone]

## 2012-02-05 NOTE — ED Notes (Signed)
[facility] here and taking pt for transport, discharge paperwork given to [facility]

## 2012-02-05 NOTE — ED Notes (Signed)
Pt fell last night and hit head. Today family noticed pt not acting her normal self. Not talking as much. Pt also currently being tx for UTI

## 2012-02-07 ENCOUNTER — Telehealth: Payer: Self-pay | Admitting: Pulmonary Disease

## 2012-02-07 DIAGNOSIS — R296 Repeated falls: Secondary | ICD-10-CM

## 2012-02-07 NOTE — Telephone Encounter (Signed)
Called, spoke with pt's son.  He would like [place] to refer pt to neurology -- a dr that SN recs.  States pt fell last Sunday night.  Per [name], a CT was done and was neg.  [name] reports pt's hands have been shaky and has a new onset of twitching in her whole body.  Also, states her thought process is taking longer and pt is getting distracted easier.  [name], pls advise.  Thank you.

## 2012-02-08 NOTE — Telephone Encounter (Signed)
I spoke with pt son and is aware of referral. Nothing further was needed

## 2012-02-08 NOTE — Telephone Encounter (Signed)
Per SN---we can refer her to Dr. Anne Hahn for a neuro eval for multiple falls.    This order has been placed .  thanks

## 2012-02-09 ENCOUNTER — Telehealth: Payer: Self-pay | Admitting: Pulmonary Disease

## 2012-02-09 NOTE — Telephone Encounter (Signed)
Called and spoke with pts son mike and he was calling to check on the status of neuro appt.  He stated that she has declined and does not want to miss something.  i spoke with mike and advised him that i would call neuro and check on this appt.  i called neuro and lmom for [facility] there and made them aware that the family is calling and is concerned about the neuro appt.  Will sign off of this message. Nothing further is needed.

## 2012-02-12 ENCOUNTER — Encounter: Payer: Self-pay | Admitting: Pulmonary Disease

## 2012-02-12 ENCOUNTER — Telehealth: Payer: Self-pay | Admitting: Pulmonary Disease

## 2012-02-12 NOTE — Telephone Encounter (Signed)
Noted by triage. And will forward to [facility]

## 2012-02-13 DIAGNOSIS — R269 Unspecified abnormalities of gait and mobility: Secondary | ICD-10-CM | POA: Insufficient documentation

## 2012-02-13 DIAGNOSIS — D518 Other vitamin B12 deficiency anemias: Secondary | ICD-10-CM | POA: Insufficient documentation

## 2012-02-19 ENCOUNTER — Other Ambulatory Visit: Payer: Self-pay | Admitting: Neurology

## 2012-02-19 DIAGNOSIS — D518 Other vitamin B12 deficiency anemias: Secondary | ICD-10-CM

## 2012-02-19 DIAGNOSIS — R6889 Other general symptoms and signs: Secondary | ICD-10-CM

## 2012-02-19 DIAGNOSIS — R269 Unspecified abnormalities of gait and mobility: Secondary | ICD-10-CM

## 2012-02-22 ENCOUNTER — Encounter (HOSPITAL_COMMUNITY): Payer: Medicare Other

## 2012-02-24 ENCOUNTER — Ambulatory Visit
Admission: RE | Admit: 2012-02-24 | Discharge: 2012-02-24 | Disposition: A | Discharge status: Home / Self Care | Payer: Medicare Other | Source: Ambulatory Visit | Attending: Neurology | Admitting: Neurology

## 2012-02-24 DIAGNOSIS — R6889 Other general symptoms and signs: Secondary | ICD-10-CM

## 2012-02-29 ENCOUNTER — Emergency Department (HOSPITAL_COMMUNITY): Payer: Medicare Other

## 2012-02-29 ENCOUNTER — Inpatient Hospital Stay (HOSPITAL_COMMUNITY)
Admission: EM | Admit: 2012-02-29 | Discharge: 2012-03-27 | DRG: 329 | Disposition: A | Discharge status: Skilled Nursing Facility | Payer: Medicare Other | Attending: Internal Medicine | Admitting: Internal Medicine

## 2012-02-29 ENCOUNTER — Encounter (HOSPITAL_COMMUNITY): Payer: Self-pay | Admitting: Emergency Medicine

## 2012-02-29 DIAGNOSIS — J9610 Chronic respiratory failure, unspecified whether with hypoxia or hypercapnia: Secondary | ICD-10-CM | POA: Diagnosis present

## 2012-02-29 DIAGNOSIS — Z9981 Dependence on supplemental oxygen: Secondary | ICD-10-CM

## 2012-02-29 DIAGNOSIS — J9620 Acute and chronic respiratory failure, unspecified whether with hypoxia or hypercapnia: Secondary | ICD-10-CM

## 2012-02-29 DIAGNOSIS — K5730 Diverticulosis of large intestine without perforation or abscess without bleeding: Secondary | ICD-10-CM

## 2012-02-29 DIAGNOSIS — I319 Disease of pericardium, unspecified: Secondary | ICD-10-CM | POA: Diagnosis present

## 2012-02-29 DIAGNOSIS — E785 Hyperlipidemia, unspecified: Secondary | ICD-10-CM | POA: Diagnosis present

## 2012-02-29 DIAGNOSIS — R933 Abnormal findings on diagnostic imaging of other parts of digestive tract: Secondary | ICD-10-CM | POA: Diagnosis present

## 2012-02-29 DIAGNOSIS — I5042 Chronic combined systolic (congestive) and diastolic (congestive) heart failure: Secondary | ICD-10-CM

## 2012-02-29 DIAGNOSIS — D72829 Elevated white blood cell count, unspecified: Secondary | ICD-10-CM

## 2012-02-29 DIAGNOSIS — I509 Heart failure, unspecified: Secondary | ICD-10-CM | POA: Diagnosis present

## 2012-02-29 DIAGNOSIS — J4489 Other specified chronic obstructive pulmonary disease: Secondary | ICD-10-CM | POA: Diagnosis present

## 2012-02-29 DIAGNOSIS — F3289 Other specified depressive episodes: Secondary | ICD-10-CM | POA: Diagnosis present

## 2012-02-29 DIAGNOSIS — I2589 Other forms of chronic ischemic heart disease: Secondary | ICD-10-CM | POA: Diagnosis present

## 2012-02-29 DIAGNOSIS — N390 Urinary tract infection, site not specified: Secondary | ICD-10-CM | POA: Diagnosis present

## 2012-02-29 DIAGNOSIS — I38 Endocarditis, valve unspecified: Secondary | ICD-10-CM | POA: Diagnosis present

## 2012-02-29 DIAGNOSIS — Z01810 Encounter for preprocedural cardiovascular examination: Secondary | ICD-10-CM

## 2012-02-29 DIAGNOSIS — D75839 Thrombocytosis, unspecified: Secondary | ICD-10-CM

## 2012-02-29 DIAGNOSIS — J449 Chronic obstructive pulmonary disease, unspecified: Secondary | ICD-10-CM | POA: Diagnosis present

## 2012-02-29 DIAGNOSIS — K869 Disease of pancreas, unspecified: Secondary | ICD-10-CM | POA: Diagnosis present

## 2012-02-29 DIAGNOSIS — K560 Paralytic ileus: Secondary | ICD-10-CM | POA: Diagnosis not present

## 2012-02-29 DIAGNOSIS — Z6826 Body mass index (BMI) 26.0-26.9, adult: Secondary | ICD-10-CM

## 2012-02-29 DIAGNOSIS — E875 Hyperkalemia: Secondary | ICD-10-CM | POA: Diagnosis not present

## 2012-02-29 DIAGNOSIS — E43 Unspecified severe protein-calorie malnutrition: Secondary | ICD-10-CM | POA: Diagnosis present

## 2012-02-29 DIAGNOSIS — D509 Iron deficiency anemia, unspecified: Secondary | ICD-10-CM | POA: Diagnosis present

## 2012-02-29 DIAGNOSIS — K929 Disease of digestive system, unspecified: Secondary | ICD-10-CM | POA: Diagnosis not present

## 2012-02-29 DIAGNOSIS — Z66 Do not resuscitate: Secondary | ICD-10-CM | POA: Diagnosis present

## 2012-02-29 DIAGNOSIS — R0600 Dyspnea, unspecified: Secondary | ICD-10-CM

## 2012-02-29 DIAGNOSIS — D638 Anemia in other chronic diseases classified elsewhere: Secondary | ICD-10-CM | POA: Diagnosis present

## 2012-02-29 DIAGNOSIS — R0902 Hypoxemia: Secondary | ICD-10-CM

## 2012-02-29 DIAGNOSIS — I309 Acute pericarditis, unspecified: Secondary | ICD-10-CM

## 2012-02-29 DIAGNOSIS — I739 Peripheral vascular disease, unspecified: Secondary | ICD-10-CM | POA: Diagnosis present

## 2012-02-29 DIAGNOSIS — N200 Calculus of kidney: Secondary | ICD-10-CM | POA: Diagnosis present

## 2012-02-29 DIAGNOSIS — R7309 Other abnormal glucose: Secondary | ICD-10-CM | POA: Diagnosis not present

## 2012-02-29 DIAGNOSIS — K5792 Diverticulitis of intestine, part unspecified, without perforation or abscess without bleeding: Secondary | ICD-10-CM | POA: Diagnosis present

## 2012-02-29 DIAGNOSIS — K529 Noninfective gastroenteritis and colitis, unspecified: Secondary | ICD-10-CM

## 2012-02-29 DIAGNOSIS — I10 Essential (primary) hypertension: Secondary | ICD-10-CM | POA: Diagnosis present

## 2012-02-29 DIAGNOSIS — I340 Nonrheumatic mitral (valve) insufficiency: Secondary | ICD-10-CM

## 2012-02-29 DIAGNOSIS — K9189 Other postprocedural complications and disorders of digestive system: Secondary | ICD-10-CM

## 2012-02-29 DIAGNOSIS — E7800 Pure hypercholesterolemia, unspecified: Secondary | ICD-10-CM | POA: Diagnosis present

## 2012-02-29 DIAGNOSIS — L97909 Non-pressure chronic ulcer of unspecified part of unspecified lower leg with unspecified severity: Secondary | ICD-10-CM | POA: Diagnosis present

## 2012-02-29 DIAGNOSIS — R1032 Left lower quadrant pain: Secondary | ICD-10-CM | POA: Diagnosis present

## 2012-02-29 DIAGNOSIS — J9611 Chronic respiratory failure with hypoxia: Secondary | ICD-10-CM | POA: Diagnosis present

## 2012-02-29 DIAGNOSIS — I5032 Chronic diastolic (congestive) heart failure: Secondary | ICD-10-CM | POA: Diagnosis present

## 2012-02-29 DIAGNOSIS — G894 Chronic pain syndrome: Secondary | ICD-10-CM

## 2012-02-29 DIAGNOSIS — D473 Essential (hemorrhagic) thrombocythemia: Secondary | ICD-10-CM

## 2012-02-29 DIAGNOSIS — Z9181 History of falling: Secondary | ICD-10-CM

## 2012-02-29 DIAGNOSIS — K5289 Other specified noninfective gastroenteritis and colitis: Secondary | ICD-10-CM | POA: Diagnosis present

## 2012-02-29 DIAGNOSIS — D649 Anemia, unspecified: Secondary | ICD-10-CM | POA: Diagnosis present

## 2012-02-29 DIAGNOSIS — F329 Major depressive disorder, single episode, unspecified: Secondary | ICD-10-CM | POA: Diagnosis present

## 2012-02-29 DIAGNOSIS — I5033 Acute on chronic diastolic (congestive) heart failure: Secondary | ICD-10-CM

## 2012-02-29 DIAGNOSIS — Y836 Removal of other organ (partial) (total) as the cause of abnormal reaction of the patient, or of later complication, without mention of misadventure at the time of the procedure: Secondary | ICD-10-CM | POA: Diagnosis not present

## 2012-02-29 DIAGNOSIS — K8689 Other specified diseases of pancreas: Secondary | ICD-10-CM | POA: Diagnosis present

## 2012-02-29 DIAGNOSIS — R112 Nausea with vomiting, unspecified: Secondary | ICD-10-CM

## 2012-02-29 DIAGNOSIS — K5732 Diverticulitis of large intestine without perforation or abscess without bleeding: Secondary | ICD-10-CM

## 2012-02-29 DIAGNOSIS — A498 Other bacterial infections of unspecified site: Secondary | ICD-10-CM | POA: Diagnosis present

## 2012-02-29 DIAGNOSIS — I059 Rheumatic mitral valve disease, unspecified: Secondary | ICD-10-CM | POA: Diagnosis present

## 2012-02-29 HISTORY — DX: Non-pressure chronic ulcer of unspecified part of unspecified lower leg with unspecified severity: L97.909

## 2012-02-29 HISTORY — DX: Chronic obstructive pulmonary disease, unspecified: J44.9

## 2012-02-29 LAB — URINALYSIS, ROUTINE W REFLEX MICROSCOPIC
Bilirubin Urine: NEGATIVE
Nitrite: POSITIVE — AB
Protein, ur: NEGATIVE mg/dL
Specific Gravity, Urine: 1.012 (ref 1.005–1.030)
Urobilinogen, UA: 0.2 mg/dL (ref 0.0–1.0)

## 2012-02-29 LAB — COMPREHENSIVE METABOLIC PANEL
ALT: 12 U/L (ref 0–35)
AST: 16 U/L (ref 0–37)
CO2: 29 mEq/L (ref 19–32)
Calcium: 9.7 mg/dL (ref 8.4–10.5)
Chloride: 97 mEq/L (ref 96–112)
Creatinine, Ser: 1.01 mg/dL (ref 0.50–1.10)
GFR calc Af Amer: 58 mL/min — ABNORMAL LOW (ref >90)
GFR calc non Af Amer: 50 mL/min — ABNORMAL LOW (ref >90)
Glucose, Bld: 133 mg/dL — ABNORMAL HIGH (ref 70–99)
Sodium: 136 mEq/L (ref 135–145)
Total Bilirubin: 0.3 mg/dL (ref 0.3–1.2)

## 2012-02-29 LAB — CBC WITH DIFFERENTIAL/PLATELET
Eosinophils Absolute: 0.2 10*3/uL (ref 0.0–0.7)
Eosinophils Relative: 2 % (ref 0–5)
Hemoglobin: 11.1 g/dL — ABNORMAL LOW (ref 12.0–15.0)
Lymphocytes Relative: 11 % — ABNORMAL LOW (ref 12–46)
Lymphs Abs: 1.3 10*3/uL (ref 0.7–4.0)
MCH: 28 pg (ref 26.0–34.0)
MCV: 86.4 fL (ref 78.0–100.0)
Monocytes Relative: 15 % — ABNORMAL HIGH (ref 3–12)
RBC: 3.96 MIL/uL (ref 3.87–5.11)
WBC: 11.8 10*3/uL — ABNORMAL HIGH (ref 4.0–10.5)

## 2012-02-29 LAB — URINE MICROSCOPIC-ADD ON

## 2012-02-29 MED ORDER — METRONIDAZOLE IN NACL 5-0.79 MG/ML-% IV SOLN: 500.0000 mg | Freq: Once | INTRAVENOUS | Status: DC

## 2012-02-29 MED ORDER — DEXTROSE-NACL 5-0.45 % IV SOLN
INTRAVENOUS | Status: AC
  Administered 2012-02-29 – 2012-03-02 (×3): via INTRAVENOUS

## 2012-02-29 MED ORDER — ONDANSETRON HCL 4 MG/2ML IJ SOLN: 4.0000 mg | Freq: Four times a day (QID) | INTRAMUSCULAR | Status: DC | PRN

## 2012-02-29 MED ORDER — BUDESONIDE-FORMOTEROL FUMARATE 160-4.5 MCG/ACT IN AERO
1.0000 | INHALATION_SPRAY | Freq: Two times a day (BID) | RESPIRATORY_TRACT | Status: DC
  Administered 2012-02-29 – 2012-03-27 (×50): 1 via RESPIRATORY_TRACT
  Filled 2012-02-29 (×3): qty 6

## 2012-02-29 MED ORDER — SODIUM CHLORIDE 0.9 % IV BOLUS (SEPSIS)
1000.0000 mL | Freq: Once | INTRAVENOUS | Status: AC
  Administered 2012-02-29: 1000 mL via INTRAVENOUS

## 2012-02-29 MED ORDER — TIOTROPIUM BROMIDE MONOHYDRATE 18 MCG IN CAPS
18.0000 ug | ORAL_CAPSULE | Freq: Every day | RESPIRATORY_TRACT | Status: DC
  Administered 2012-03-01 – 2012-03-27 (×24): 18 ug via RESPIRATORY_TRACT
  Filled 2012-02-29 (×6): qty 5

## 2012-02-29 MED ORDER — IOHEXOL 300 MG/ML  SOLN
50.0000 mL | Freq: Once | INTRAMUSCULAR | Status: AC | PRN
  Administered 2012-02-29: 25 mL via ORAL

## 2012-02-29 MED ORDER — ONDANSETRON HCL 4 MG/2ML IJ SOLN
4.0000 mg | Freq: Four times a day (QID) | INTRAMUSCULAR | Status: DC | PRN
  Administered 2012-03-04: 4 mg via INTRAVENOUS
  Filled 2012-02-29: qty 2

## 2012-02-29 MED ORDER — DEXTROSE 5 % IV SOLN
1.0000 g | Freq: Once | INTRAVENOUS | Status: AC
  Administered 2012-02-29: 1 g via INTRAVENOUS
  Filled 2012-02-29: qty 10

## 2012-02-29 MED ORDER — ALBUTEROL SULFATE (5 MG/ML) 0.5% IN NEBU: 2.5000 mg | INHALATION_SOLUTION | RESPIRATORY_TRACT | Status: DC | PRN

## 2012-02-29 MED ORDER — SODIUM CHLORIDE 0.9 % IJ SOLN
3.0000 mL | Freq: Two times a day (BID) | INTRAMUSCULAR | Status: DC
  Administered 2012-02-29 – 2012-03-16 (×6): 3 mL via INTRAVENOUS

## 2012-02-29 MED ORDER — SODIUM CHLORIDE 0.9 % IV BOLUS (SEPSIS)
700.0000 mL | Freq: Once | INTRAVENOUS | Status: AC
  Administered 2012-02-29: 700 mL via INTRAVENOUS

## 2012-02-29 MED ORDER — CIPROFLOXACIN IN D5W 400 MG/200ML IV SOLN
400.0000 mg | Freq: Two times a day (BID) | INTRAVENOUS | Status: DC
  Administered 2012-02-29 – 2012-03-03 (×6): 400 mg via INTRAVENOUS
  Filled 2012-02-29 (×8): qty 200

## 2012-02-29 MED ORDER — SODIUM CHLORIDE 0.9 % IV SOLN: INTRAVENOUS | Status: DC

## 2012-02-29 MED ORDER — CIPROFLOXACIN IN D5W 400 MG/200ML IV SOLN: 400.0000 mg | Freq: Once | INTRAVENOUS | Status: DC

## 2012-02-29 MED ORDER — ONDANSETRON HCL 4 MG PO TABS: 4.0000 mg | ORAL_TABLET | Freq: Four times a day (QID) | ORAL | Status: DC | PRN

## 2012-02-29 MED ORDER — IOHEXOL 300 MG/ML  SOLN
80.0000 mL | Freq: Once | INTRAMUSCULAR | Status: AC | PRN
  Administered 2012-02-29: 80 mL via INTRAVENOUS

## 2012-02-29 MED ORDER — TIOTROPIUM BROMIDE MONOHYDRATE 18 MCG IN CAPS
18.0000 ug | ORAL_CAPSULE | Freq: Two times a day (BID) | RESPIRATORY_TRACT | Status: DC
  Filled 2012-02-29: qty 5

## 2012-02-29 MED ORDER — METRONIDAZOLE IN NACL 5-0.79 MG/ML-% IV SOLN
500.0000 mg | Freq: Three times a day (TID) | INTRAVENOUS | Status: DC
  Administered 2012-02-29 – 2012-03-06 (×17): 500 mg via INTRAVENOUS
  Filled 2012-02-29 (×20): qty 100

## 2012-02-29 MED ORDER — MORPHINE SULFATE 4 MG/ML IJ SOLN: 4.0000 mg | INTRAMUSCULAR | Status: DC | PRN

## 2012-02-29 MED ORDER — ONDANSETRON HCL 4 MG/2ML IJ SOLN
4.0000 mg | Freq: Once | INTRAMUSCULAR | Status: AC
  Administered 2012-02-29: 4 mg via INTRAVENOUS
  Filled 2012-02-29: qty 2

## 2012-02-29 MED ORDER — MORPHINE SULFATE 4 MG/ML IJ SOLN
4.0000 mg | INTRAMUSCULAR | Status: DC | PRN
  Administered 2012-03-01: 4 mg via INTRAVENOUS
  Filled 2012-02-29: qty 1

## 2012-02-29 MED ORDER — HEPARIN SODIUM (PORCINE) 5000 UNIT/ML IJ SOLN
5000.0000 [IU] | Freq: Three times a day (TID) | INTRAMUSCULAR | Status: DC
  Administered 2012-02-29 – 2012-03-15 (×42): 5000 [IU] via SUBCUTANEOUS
  Filled 2012-02-29 (×47): qty 1

## 2012-02-29 MED ORDER — MORPHINE SULFATE 4 MG/ML IJ SOLN
4.0000 mg | Freq: Once | INTRAMUSCULAR | Status: AC
  Administered 2012-02-29: 4 mg via INTRAVENOUS
  Filled 2012-02-29: qty 1

## 2012-02-29 NOTE — Progress Notes (Addendum)
ANTIBIOTIC CONSULT NOTE - INITIAL  Pharmacy Consult for Cipro  Indication:Diverticulitis/UTI   Allergies  Allergen Reactions  . Sulfonamide Derivatives Swelling    Patient Measurements: Height: 5' (152.4 cm) Weight: 143 lb 8.3 oz (65.1 kg) IBW/kg (Calculated) : 45.5   Vital Signs: Temp: 98.4 F (36.9 C) (02/27 1900) Temp src: Oral (02/27 1900) BP: 118/76 mmHg (02/27 1900) Pulse Rate: 68 (02/27 1900) Intake/Output from previous day:   Intake/Output from this shift:    Labs:  Recent Labs  02/29/12 1535  WBC 11.8*  HGB 11.1*  PLT 446*  CREATININE 1.01   Estimated Creatinine Clearance: 36.1 ml/min (by C-G formula based on Cr of 1.01). No results found for this basename: VANCOTROUGH, VANCOPEAK, VANCORANDOM, GENTTROUGH, GENTPEAK, GENTRANDOM, TOBRATROUGH, TOBRAPEAK, TOBRARND, AMIKACINPEAK, AMIKACINTROU, AMIKACIN,  in the last 72 hours   Microbiology: No results found for this or any previous visit (from the past 720 hour(s)).  Medical History: Past Medical History  Diagnosis Date  . Disorders of diaphragm     right diaphragm elevation  . Unspecified essential hypertension   . Endocarditis, valve unspecified, unspecified cause   . Cardiac dysrhythmia, unspecified   . Unspecified venous (peripheral) insufficiency   . Edema   . Pure hypercholesterolemia   . Other dysphagia   . Diverticulosis of colon (without mention of hemorrhage)   . Benign neoplasm of colon   . Osteoarthrosis, unspecified whether generalized or localized, unspecified site   . Lumbago   . Chronic pain syndrome   . Disorder of bone and cartilage, unspecified   . Depressive disorder, not elsewhere classified   . Anemia, unspecified   . Skin cancer   . Systolic heart failure     echo 10/10/10 EF 30%  . Hiatal hernia   . Gastritis   . COPD (chronic obstructive pulmonary disease)   . Lower extremity ulceration     dressing changes daily    Medications:  Scheduled:  . sodium chloride    Intravenous STAT  . budesonide-formoterol  1 puff Inhalation BID  . [COMPLETED] cefTRIAXone (ROCEPHIN)  IV  1 g Intravenous Once  . ciprofloxacin  400 mg Intravenous Q12H  . heparin  5,000 Units Subcutaneous Q8H  . metronidazole  500 mg Intravenous Q8H  . [COMPLETED]  morphine injection  4 mg Intravenous Once  . [COMPLETED] ondansetron (ZOFRAN) IV  4 mg Intravenous Once  . [COMPLETED] sodium chloride  1,000 mL Intravenous Once  . [COMPLETED] sodium chloride  700 mL Intravenous Once  . sodium chloride  3 mL Intravenous Q12H  . tiotropium  18 mcg Inhalation BID  . [DISCONTINUED] ciprofloxacin  400 mg Intravenous Once  . [DISCONTINUED] metronidazole  500 mg Intravenous Once   Infusions:  . dextrose 5 % and 0.45% NaCl    . [DISCONTINUED] sodium chloride     PRN: albuterol, [COMPLETED] iohexol, [COMPLETED] iohexol, morphine injection, ondansetron (ZOFRAN) IV, ondansetron, [DISCONTINUED]  morphine injection, [DISCONTINUED] ondansetron (ZOFRAN) IV Assessment:  79 yo F starting Cipro/flagyl for Diverticulitis and UTI.  SCr wnl, CrCl 36 ml/min.  AF.  Urine culture sent   Goal of Therapy:  Ciprofloxacin per renal function   Plan:  1.) Ciprofloxacin 400 mg IV q12h  2.) Flagyl 500 mg IV q8h  3.) Monitor renal function, adjust abx as needed   Aziyah Provencal, Loma Messing PharmD Pager #: [phone] 7:39 PM 02/29/2012

## 2012-02-29 NOTE — H&P (Signed)
[facility] History and Physical  Martrice Apt AVW:[id number] DOB: Oct 26, 1927 DOA: 02/29/2012  Referring physician:  Devoria Albe PCP:  Michele Mcalpine, MD   Chief Complaint:  Abdominal pain  HPI:  The patient is a 79 y.o. year-old female with history of likely ischemic LVEF 30%, restrictive lung disease due to elevated diaphragm, COPD, GERD, endocarditis, recurrent falls, recurrent UTIs, and hx of diverticulitis who presents with abdominal pain, N/V and fatigue.    The patient was last at their baseline health many months ago.  For the last several months, she has been more ill than well.  She was hospitalized in late early august with a fall, then in lateOctober with diverticulitis of the sigmoid colon at which time she was found to have a pancreatic mass.  She has been followed very closely by Drs. Nadel and Bensimhon and her last GI visit was with Dr. Christella Hartigan in December who recommended against endoscopy at that time.  Over the last few months, she has had 2 or 3 UTIs and several falls.  She has been in rehab for the last 5 weeks and has had a trip to the ER for fall with head trauma just a few weeks ago.   Her current symptoms started about 1.5 weeks ago with an 8/10 crampy left lower quadrant pain that is similar to her previous diverticulitis.  The pain comes and goes and does not radiate.  She has no associated symptoms.  Nothing brings it on and rubbing it seems to make it improve.  She has been becoming progressively weaker and for the last two to three days she has not been able to participate in therapies.  For the last two days she has had nausea with vomiting of nonbilious, nonbloody, non-coffee ground emesis.  Her BMs have been much softer than usual but without blood.  Denies fevers and symptoms of UTI such as urgency, frequency, dysuria, and incontinence.    In the ER, she was found to have TNTC WBC on UA and CT abd/pelvis demonstrated "Exuberant inflammatory process involving the  sigmoid colon appears chronic. This inflammation is present on comparison exam  of 10/31/2011 but is worse now."  She was given ceftriaxone, then cipro and flagyl in the ER and is being admitted for sigmoiditis and UTI.    Review of Systems:  Denies fevers, chills, weight loss or gain, changes to hearing and vision.   Hearing loss.  Denies rhinorrhea, sinus congestion, sore throat.  Denies chest pain and palpitations.  Denies SOB, wheezing, cough.  + nausea, vomiting.  Denies constipation, diarrhea.  Denies dysuria, frequency, urgency, polyuria, polydipsia.  Denies hematemesis, blood in stools, melena.  Bruises easily.  Denies lymphadenopathy.  Denies arthralgias, myalgias.  Bilateral lower extremity anterior shin ulcers.  Denies lower extremity edema.  Denies focal numbness, weakness, slurred speech.  Has generalized weakness and less strength on the left side and a left facial droop which are chronic.  Denies anxiety and depression.    Past Medical History  Diagnosis Date  . Disorders of diaphragm     right diaphragm elevation  . Unspecified essential hypertension   . Endocarditis, valve unspecified, unspecified cause   . Cardiac dysrhythmia, unspecified   . Unspecified venous (peripheral) insufficiency   . Edema   . Pure hypercholesterolemia   . Other dysphagia   . Diverticulosis of colon (without mention of hemorrhage)   . Benign neoplasm of colon   . Osteoarthrosis, unspecified whether generalized or localized, unspecified site   .  Lumbago   . Chronic pain syndrome   . Disorder of bone and cartilage, unspecified   . Depressive disorder, not elsewhere classified   . Anemia, unspecified   . Skin cancer   . Systolic heart failure     echo 10/10/10 EF 30%  . Hiatal hernia   . Gastritis   . COPD (chronic obstructive pulmonary disease)   . Lower extremity ulceration     dressing changes daily   Past Surgical History  Procedure Laterality Date  . Appendectomy    . Breast surgery       biopsy  . Left wrist surgery    . Bilateral cataracts    . Hip surgery      dr. Rayburn Ma, twice because the bones were spongy  . Tonsillectomy    . Adenoidectomy     Social History:  reports that she has never smoked. She has never used smokeless tobacco. She reports that she does not drink alcohol or use illicit drugs. Pt has independent apartment at [employer].  Rehab for last 5 weeks.  Uses a walker to get around.  She has had some falls since in rehab.    Allergies  Allergen Reactions  . Sulfonamide Derivatives Swelling    Family History  Problem Relation Age of Onset  . Diabetes Mother   . Heart disease Father   . Lung cancer Sister   . Alcohol abuse Sister   . Alcohol abuse Brother      Prior to Admission medications   Medication Sig Start Date End Date Taking? Authorizing Provider  aspirin 81 MG EC tablet Take 81 mg by mouth every morning.    Yes Historical Provider, MD  bisoprolol (ZEBETA) 5 MG tablet Take 2.5 mg by mouth every morning.  01/01/12  Yes Amy D Clegg, NP  budesonide-formoterol (SYMBICORT) 160-4.5 MCG/ACT inhaler Inhale 1 puff into the lungs 2 (two) times daily.   Yes Historical Provider, MD  Calcium Carbonate-Vit D-Min (CALTRATE 600+D PLUS) 600-400 MG-UNIT per tablet Chew 1 tablet by mouth 2 (two) times daily.    Yes Historical Provider, MD  cholecalciferol (VITAMIN D) 1000 UNITS tablet Take 2,000 Units by mouth every morning.    Yes Historical Provider, MD  citalopram (CELEXA) 20 MG tablet Take 20 mg by mouth every morning. 10/09/11  Yes Michele Mcalpine, MD  ferrous sulfate 325 (65 FE) MG tablet Take 325 mg by mouth 2 (two) times daily.     Yes Historical Provider, MD  furosemide (LASIX) 40 MG tablet Take 40 mg by mouth every morning. 12/14/11  Yes Amy D Clegg, NP  lisinopril (PRINIVIL,ZESTRIL) 5 MG tablet Take 2.5 mg by mouth every morning.  11/23/11  Yes Michele Mcalpine, MD  loratadine (CLARITIN) 10 MG tablet Take 10 mg by mouth every morning.    Yes  Historical Provider, MD  morphine (MSIR) 15 MG tablet Take 15 mg by mouth 2 (two) times daily. Pain.   Yes Historical Provider, MD  Multiple Vitamin (MULTIVITAMIN WITH MINERALS) TABS Take 1 tablet by mouth every morning.    Yes Historical Provider, MD  Naproxen Sodium (ALEVE) 220 MG CAPS Take 1 capsule by mouth 2 (two) times daily.   Yes Historical Provider, MD  Probiotic Product (ALIGN) 4 MG CAPS Take 1 capsule by mouth every morning.   Yes Historical Provider, MD  spironolactone (ALDACTONE) 25 MG tablet Take 12.5 mg by mouth every morning. 01/01/12  Yes Amy D Clegg, NP  tiotropium (SPIRIVA) 18 MCG inhalation capsule  Place 18 mcg into inhaler and inhale 2 (two) times daily.   Yes Historical Provider, MD  vitamin C (ASCORBIC ACID) 500 MG tablet Take 500 mg by mouth 2 (two) times daily.    Yes Historical Provider, MD  zinc gluconate 50 MG tablet Take 100 mg by mouth every morning.   Yes Historical Provider, MD   Physical Exam: Filed Vitals:   02/29/12 1550 02/29/12 1600 02/29/12 1630 02/29/12 1900  BP:  117/55 138/72 118/76  Pulse:  67  68  Temp:    98.4 F (36.9 C)  TempSrc:    Oral  Resp:  19 22 20   Height:    5' (1.524 m)  Weight:    65.1 kg (143 lb 8.3 oz)  SpO2: 96%   100%     General:  Thin CF, no acute distress, lying on stretcher  Eyes:  PERRL, anicteric, non-injected.  ENT:  Nares clear.  OP clear, non-erythematous without plaques or exudates.  Dry MM.  Neck:  Supple without TM or JVD.    Lymph:  No cervical, supraclavicular, or submandibular LAD.  Cardiovascular:  RRR, 3/6 murmur at the apex that obscures S2.  2+ pulses, warm extremities  Respiratory:  CTA bilaterally without increased WOB.  Abdomen:  NABS.  Soft, ND, TTP in the left lower quadrant and gasps with palpation.  No rebound or guarding.    Skin:  Bilateral shin ulcers without surrounding erythema or induration.  No odor.  Musculoskeletal:  Normal bulk and tone.  No LE edema.  Psychiatric:  A & O x 4.   Appropriate affect. Neurologic:  CN 3-12 intact except hearing loss and left facial droop.  4/5 strength right extremities, 4-/5 left extremity.  Sensation intact.  Labs on Admission:  Basic Metabolic Panel:  Recent Labs Lab 02/29/12 1535  NA 136  K 4.0  CL 97  CO2 29  GLUCOSE 133*  BUN 46*  CREATININE 1.01  CALCIUM 9.7   Liver Function Tests:  Recent Labs Lab 02/29/12 1535  AST 16  ALT 12  ALKPHOS 120*  BILITOT 0.3  PROT 7.4  ALBUMIN 2.7*   No results found for this basename: LIPASE, AMYLASE,  in the last 168 hours No results found for this basename: AMMONIA,  in the last 168 hours CBC:  Recent Labs Lab 02/29/12 1535  WBC 11.8*  NEUTROABS 8.4*  HGB 11.1*  HCT 34.2*  MCV 86.4  PLT 446*   Cardiac Enzymes: No results found for this basename: CKTOTAL, CKMB, CKMBINDEX, TROPONINI,  in the last 168 hours  BNP (last 3 results)  Recent Labs  08/02/11 1630 11/02/11 0623 11/03/11 0628  PROBNP 1420.0* 4539.0* 1925.0*   CBG: No results found for this basename: GLUCAP,  in the last 168 hours  Radiological Exams on Admission: Ct Abdomen Pelvis W Contrast  02/29/2012  *RADIOLOGY REPORT*  Clinical Data: Left lower quadrant pain, abdominal pain  CT ABDOMEN AND PELVIS WITH CONTRAST  Technique:  Multidetector CT imaging of the abdomen and pelvis was performed following the standard protocol during bolus administration of intravenous contrast.  Contrast: 25mL OMNIPAQUE IOHEXOL 300 MG/ML  SOLN, 80mL OMNIPAQUE IOHEXOL 300 MG/ML  SOLN  Comparison: CT 10/31/2011  Findings: Limited view of the lung bases are unchanged.  There is elevation of the right hemidiaphragm.  No focal hepatic lesion.  Entire liver is not imaged.  A portion of the gallbladder is imaged which appears normal.  The pancreas, spleen, adrenal glands, and kidneys are unchanged. Nonobstructing  calculus in the lower pole of the left kidney.  There is a mixed density cysts in the left and right kidney which are not  changed in size compared to prior.  The stomach, small bowel, and cecum are normal.  Moderate volume stool throughout the colon.  There is an exuberant inflammatory response associated with the proximal sigmoid colon which occurs over approximately 10 cm at the site of prior inflammation.  There is some fat within the wall of the colon suggesting chronic infection/inflammation.  The bowel wall is thickened and edematous measuring 11 mm in thickness.  There is no evidence of frank abscess or intraperitoneal free air.  There is stool in the rectum.  Abdominal aorta normal caliber.  Post hysterectomy anatomy.  No pelvic lymphadenopathy.  Review of  bone windows demonstrates no aggressive osseous lesions.  IMPRESSION:  1.  Exuberant inflammatory process involving the sigmoid colon appears chronic.  This inflammation is present on comparison exam of 10/31/2011 but is worse now.  Differential includes chronic diverticulitis or chronic segmental colitis with differential including inflammatory bowel disease.  No evidence of macroperforation or abscess.  2.  The liver and gallbladder are partially imaged due to elevation of the right hemidiaphragm.   Original Report Authenticated By: Genevive Bi, M.D.     EKG: Independently reviewed. NSR  Assessment/Plan Principal Problem:   Diverticulitis Active Problems:   HYPERTENSION   UTI (lower urinary tract infection)   Chronic respiratory failure with hypoxia   Diastolic CHF, chronic   Pancreatic mass   Anemia   Wound of left leg   Leukocytosis, unspecified   Thrombocytosis   Sigmoiditis, acute on chronic.  DDx includes infectious, recurrent bacterial infection, possible underlying malignancy or inflammatory disorder. -  CLD -  Zofran and phenergan prn -  C. Diff -  Stool culture -  O&P -  Occult and lactoferrin -  Cipro/flagyl -  Please notify GI Dr. Christella Hartigan in AM of patient's admission  UTI:   -  F/u urine culture -  cipro as  above  HYPERTENSION/HLD, chronic diastolic/systolic CHF stable -  Hold oral medications for now -  Restart as tolerated -  Appears hypovolemic  Chronic respiratory failure with hypoxia due to COPD, restrictive lung disease, stable.  -  Continue 2L [place] -  Continue symbicort and spiriva with albuterol prn  Pancreatic mass, stable and being followed by GI.   Bilateral lower extremity ulcers, appear to be healing well -  Continue dressing changes daily  Leukocytosis, unspecified, likely related to UTI and sigmoiditis -  Trend, tx as above Iron deficiency anemia, holding iron in setting of acute infection -  Tx for hemoglobin < 7 Thrombocytosis, likely reactive from infection -  Trend  Dehydration suggested by levated BUN:Cr and clinical exam -  Gentle IVF overnight -  Monitor carefully for hypervolemia  Diet:  Clear liquid Access:  PIV IVF:  NS at 7ml/h Proph:  heparin  Code Status: DNR Family Communication: spoke with patient and her son and her daughter in law Disposition Plan: admit to telemetry  Time spent: 60 min  Renae Fickle [facility] Pager [phone]  If 7PM-7AM, please contact night-coverage [email or web address] Password [facility] 02/29/2012, 8:13 PM

## 2012-02-29 NOTE — ED Notes (Signed)
Nausea and abd pain for he past week, emesis x2, llq pain,pt is from [facility] in [place].

## 2012-02-29 NOTE — Progress Notes (Signed)
CSW attempted to assess patient, however patient being transferred to inpatient unit. Per chart review patient is a resident at [facility] for short term rehab. CSW will inform unit csw. CSW will complete fl2 for Md signature and place in patient shadow chart when completed.   Katie Galvan  [phone] 02/29/2012 06:45pm

## 2012-02-29 NOTE — ED Notes (Signed)
Bed:WA13<BR> Expected date:<BR> Expected time:<BR> Means of arrival:<BR> Comments:<BR> Ems/ abd pain

## 2012-02-29 NOTE — ED Notes (Signed)
Report given to kimberly, rn on floor 

## 2012-02-29 NOTE — ED Provider Notes (Signed)
History     CSN: [id number]  Arrival date & time 02/29/12  1441   First MD Initiated Contact with Patient 02/29/12 1501      Chief Complaint  Patient presents with  . Abdominal Pain    (Consider location/radiation/quality/duration/timing/severity/associated sxs/prior treatment) HPI  Patient and her daughter-in-law report patient started having lower abdominal cramping about 12 days ago that is getting worse. She has had some loose frequent stools for the past couple of days. She has not had nausea or vomiting until today and she has vomited 3 times today. She denies having prior constipation or fever. She states she's had this pain before when she had colitis/diverticulitis. Her daughter-in-law also relates the past month she's been on three rounds of antibiotics for a urinary tract infection. She states she was on Cipro for 2 weeks then another antibiotic but she cannot recall the name of. Patient also describes feeling weak and having loss of appetite. She has not had fever.  Patient sees Dr. Christella Hartigan, [facility] for a pancreatic mass seen on prior CT scan.  They report it has not changed in one year.  PCP Dr [name]  Past Medical History  Diagnosis Date  . Disorders of diaphragm     right diaphragm elevation  . Unspecified essential hypertension   . Endocarditis, valve unspecified, unspecified cause   . Cardiac dysrhythmia, unspecified   . Unspecified venous (peripheral) insufficiency   . Edema   . Pure hypercholesterolemia   . Other dysphagia   . Diverticulosis of colon (without mention of hemorrhage)   . Benign neoplasm of colon   . Osteoarthrosis, unspecified whether generalized or localized, unspecified site   . Lumbago   . Chronic pain syndrome   . Disorder of bone and cartilage, unspecified   . Depressive disorder, not elsewhere classified   . Anemia, unspecified   . Skin cancer   . Systolic heart failure     echo 10/10/10 EF 30%  . Hiatal hernia   . Gastritis   .  COPD (chronic obstructive pulmonary disease)   . Lower extremity ulceration     dressing changes daily    Past Surgical History  Procedure Laterality Date  . Appendectomy    . Breast surgery    . Right wrist surgery    . Bilateral cataracts    . Hip surgery    . Tonsillectomy    . Adenoidectomy      Family History  Problem Relation Age of Onset  . Diabetes Mother   . Heart disease Father   . Lung cancer Sister     History  Substance Use Topics  . Smoking status: Never Smoker   . Smokeless tobacco: Never Used  . Alcohol Use: No   PT has been in Rehab for 1 month for generalized weakness, was living in an apartment by herself  OB History   Grav Para Term Preterm Abortions TAB SAB Ect Mult Living   2 2 1 1      2       Review of Systems  All other systems reviewed and are negative.    Allergies  Sulfonamide derivatives  Home Medications   Current Outpatient Rx  Name  Route  Sig  Dispense  Refill  . aspirin 81 MG EC tablet   Oral   Take 81 mg by mouth every morning.          . bisoprolol (ZEBETA) 5 MG tablet   Oral   Take 2.5 mg  by mouth every morning.          . budesonide-formoterol (SYMBICORT) 160-4.5 MCG/ACT inhaler   Inhalation   Inhale 1 puff into the lungs 2 (two) times daily.         . Calcium Carbonate-Vit D-Min (CALTRATE 600+D PLUS) 600-400 MG-UNIT per tablet   Oral   Chew 1 tablet by mouth 2 (two) times daily.          . cholecalciferol (VITAMIN D) 1000 UNITS tablet   Oral   Take 2,000 Units by mouth every morning.          . citalopram (CELEXA) 20 MG tablet   Oral   Take 20 mg by mouth every morning.         . ferrous sulfate 325 (65 FE) MG tablet   Oral   Take 325 mg by mouth 2 (two) times daily.           . furosemide (LASIX) 40 MG tablet   Oral   Take 40 mg by mouth every morning.         Marland Kitchen lisinopril (PRINIVIL,ZESTRIL) 5 MG tablet   Oral   Take 2.5 mg by mouth every morning.          . loratadine  (CLARITIN) 10 MG tablet   Oral   Take 10 mg by mouth every morning.          Marland Kitchen morphine (MSIR) 15 MG tablet   Oral   Take 15 mg by mouth 2 (two) times daily. Pain.         . Multiple Vitamin (MULTIVITAMIN WITH MINERALS) TABS   Oral   Take 1 tablet by mouth every morning.          . Naproxen Sodium (ALEVE) 220 MG CAPS   Oral   Take 1 capsule by mouth 2 (two) times daily.         . Probiotic Product (ALIGN) 4 MG CAPS   Oral   Take 1 capsule by mouth every morning.         Marland Kitchen spironolactone (ALDACTONE) 25 MG tablet   Oral   Take 12.5 mg by mouth every morning.         . tiotropium (SPIRIVA) 18 MCG inhalation capsule   Inhalation   Place 18 mcg into inhaler and inhale 2 (two) times daily.         . vitamin C (ASCORBIC ACID) 500 MG tablet   Oral   Take 500 mg by mouth 2 (two) times daily.          Marland Kitchen zinc gluconate 50 MG tablet   Oral   Take 100 mg by mouth every morning.           BP 117/55  Pulse 67  Temp(Src) 98 F (36.7 C) (Oral)  Resp 19  SpO2 96%  Vital signs normal    Physical Exam  Nursing note and vitals reviewed. Constitutional: She is oriented to person, place, and time. She appears well-developed and well-nourished.  Non-toxic appearance. She does not appear ill. No distress.  HENT:  Head: Normocephalic and atraumatic.  Right Ear: External ear normal.  Left Ear: External ear normal.  Nose: Nose normal. No mucosal edema or rhinorrhea.  Mouth/Throat: Mucous membranes are normal. No dental abscesses or edematous.  Tongue dry  Eyes: Conjunctivae and EOM are normal. Pupils are equal, round, and reactive to light.  Neck: Normal range of motion and full passive range of motion without pain.  Neck supple.  Cardiovascular: Normal rate, regular rhythm and normal heart sounds.  Exam reveals no gallop and no friction rub.   No murmur heard. Pulmonary/Chest: Effort normal and breath sounds normal. No respiratory distress. She has no wheezes. She  has no rhonchi. She has no rales. She exhibits no tenderness and no crepitus.  Abdominal: Soft. Normal appearance and bowel sounds are normal. She exhibits no distension. There is tenderness in the left lower quadrant. There is no rebound and no guarding.    Musculoskeletal: Normal range of motion. She exhibits no edema and no tenderness.  Moves all extremities well. Old deformity of her left wrist from prior fx (states it needs to be rebroken and fixed again).  Has two bandages on her lower legs from slowly healing wounds (had normal arterial flow by recent study).   Neurological: She is alert and oriented to person, place, and time. She has normal strength. No cranial nerve deficit.  Skin: Skin is warm, dry and intact. No rash noted. No erythema. No pallor.  Psychiatric: She has a normal mood and affect. Her speech is normal and behavior is normal. Her mood appears not anxious.    ED Course  Procedures (including critical care time)  Medications  0.9 %  sodium chloride infusion (not administered)  sodium chloride 0.9 % bolus 700 mL (not administered)  cefTRIAXone (ROCEPHIN) 1 g in dextrose 5 % 50 mL IVPB (not administered)  metroNIDAZOLE (FLAGYL) IVPB 500 mg (not administered)  ciprofloxacin (CIPRO) IVPB 400 mg (not administered)  sodium chloride 0.9 % bolus 1,000 mL (1,000 mLs Intravenous New Bag/Given 02/29/12 1550)  ondansetron (ZOFRAN) injection 4 mg (4 mg Intravenous Given 02/29/12 1550)  morphine 4 MG/ML injection 4 mg (4 mg Intravenous Given 02/29/12 1551)  iohexol (OMNIPAQUE) 300 MG/ML solution 50 mL (25 mLs Oral Contrast Given 02/29/12 1655)  iohexol (OMNIPAQUE) 300 MG/ML solution 80 mL (80 mLs Intravenous Contrast Given 02/29/12 1656)   Patient started on Cipro and Flagyl for her colitis/diverticulitis. I reviewed her last prior urine culture from January 17 which grown over 100,000 colonies of Escherichia coli which was sensitive to Rocephin. She was started on Rocephin for her UTI.  Per family patient was recently on 2 weeks of Cipro but still has UTI.  17:53 Dr Malachi Bonds admit to tele (hx of CHF and endocarditis), team 4  Results for orders placed during the hospital encounter of 02/29/12  CBC WITH DIFFERENTIAL      Result Value Range   WBC 11.8 (*) 4.0 - 10.5 K/uL   RBC 3.96  3.87 - 5.11 MIL/uL   Hemoglobin 11.1 (*) 12.0 - 15.0 g/dL   HCT 41.3 (*) 24.4 - 01.0 %   MCV 86.4  78.0 - 100.0 fL   MCH 28.0  26.0 - 34.0 pg   MCHC 32.5  30.0 - 36.0 g/dL   RDW 27.2 (*) 53.6 - 64.4 %   Platelets 446 (*) 150 - 400 K/uL   Neutrophils Relative 71  43 - 77 %   Neutro Abs 8.4 (*) 1.7 - 7.7 K/uL   Lymphocytes Relative 11 (*) 12 - 46 %   Lymphs Abs 1.3  0.7 - 4.0 K/uL   Monocytes Relative 15 (*) 3 - 12 %   Monocytes Absolute 1.8 (*) 0.1 - 1.0 K/uL   Eosinophils Relative 2  0 - 5 %   Eosinophils Absolute 0.2  0.0 - 0.7 K/uL   Basophils Relative 2 (*) 0 - 1 %   Basophils  Absolute 0.2 (*) 0.0 - 0.1 K/uL   WBC Morphology ATYPICAL LYMPHOCYTES    COMPREHENSIVE METABOLIC PANEL      Result Value Range   Sodium 136  135 - 145 mEq/L   Potassium 4.0  3.5 - 5.1 mEq/L   Chloride 97  96 - 112 mEq/L   CO2 29  19 - 32 mEq/L   Glucose, Bld 133 (*) 70 - 99 mg/dL   BUN 46 (*) 6 - 23 mg/dL   Creatinine, Ser 1.47  0.50 - 1.10 mg/dL   Calcium 9.7  8.4 - 82.9 mg/dL   Total Protein 7.4  6.0 - 8.3 g/dL   Albumin 2.7 (*) 3.5 - 5.2 g/dL   AST 16  0 - 37 U/L   ALT 12  0 - 35 U/L   Alkaline Phosphatase 120 (*) 39 - 117 U/L   Total Bilirubin 0.3  0.3 - 1.2 mg/dL   GFR calc non Af Amer 50 (*) >90 mL/min   GFR calc Af Amer 58 (*) >90 mL/min  URINALYSIS, ROUTINE W REFLEX MICROSCOPIC      Result Value Range   Color, Urine YELLOW  YELLOW   APPearance TURBID (*) CLEAR   Specific Gravity, Urine 1.012  1.005 - 1.030   pH 5.0  5.0 - 8.0   Glucose, UA NEGATIVE  NEGATIVE mg/dL   Hgb urine dipstick LARGE (*) NEGATIVE   Bilirubin Urine NEGATIVE  NEGATIVE   Ketones, ur NEGATIVE  NEGATIVE mg/dL    Protein, ur NEGATIVE  NEGATIVE mg/dL   Urobilinogen, UA 0.2  0.0 - 1.0 mg/dL   Nitrite POSITIVE (*) NEGATIVE   Leukocytes, UA LARGE (*) NEGATIVE  URINE MICROSCOPIC-ADD ON      Result Value Range   Squamous Epithelial / LPF RARE  RARE   WBC, UA TOO NUMEROUS TO COUNT  <3 WBC/hpf   RBC / HPF 11-20  <3 RBC/hpf   Bacteria, UA FEW (*) RARE   Laboratory interpretation all normal except UTI, leukocytosis, anemia, very elevated BUN consistent with dehydration  January 19, 2012  Value   Culture ESCHERICHIA COLI    Colony Count >=100,000 COLONIES/ML    Organism ID, Bacteria ESCHERICHIA COLI    Resulting Agency [facility]   Culture & Susceptibility    Antibiotic  Organism Organism Organism     ESCHERICHIA COLI     AMPICILLIN  >=32 R Final       AMPICILLIN/SULBACTAM  >=32 R Final       CEFAZOLIN  <=4 S Final       CEFEPIME  <=1 S Final       CEFOXITIN  32 R Final       CEFTAZIDIME  <=1 S Final       CEFTRIAXONE  <=1 S Final       CIPROFLOXACIN  1 S Final       GENTAMICIN  <=1 S Final       IMIPENEM  <=0.25 S Final       LEVOFLOXACIN  1 S Final       NITROFURANTOIN  32 S Final       PIP/TAZO  8 S Final       TOBRAMYCIN  <=1 S Final       TRIMETH/SULFA  >=320 R Final                    Ct Abdomen Pelvis W Contrast  02/29/2012  *RADIOLOGY REPORT*  Clinical Data: Left lower quadrant  pain, abdominal pain  CT ABDOMEN AND PELVIS WITH CONTRAST  Technique:  Multidetector CT imaging of the abdomen and pelvis was performed following the standard protocol during bolus administration of intravenous contrast.  Contrast: 25mL OMNIPAQUE IOHEXOL 300 MG/ML  SOLN, 80mL OMNIPAQUE IOHEXOL 300 MG/ML  SOLN  Comparison: CT 10/31/2011  Findings: Limited view of the lung bases are unchanged.  There is elevation of the right hemidiaphragm.  No focal hepatic lesion.  Entire liver is not imaged.  A portion of the gallbladder is imaged which appears normal.  The pancreas, spleen, adrenal glands, and kidneys are  unchanged. Nonobstructing calculus in the lower pole of the left kidney.  There is a mixed density cysts in the left and right kidney which are not changed in size compared to prior.  The stomach, small bowel, and cecum are normal.  Moderate volume stool throughout the colon.  There is an exuberant inflammatory response associated with the proximal sigmoid colon which occurs over approximately 10 cm at the site of prior inflammation.  There is some fat within the wall of the colon suggesting chronic infection/inflammation.  The bowel wall is thickened and edematous measuring 11 mm in thickness.  There is no evidence of frank abscess or intraperitoneal free air.  There is stool in the rectum.  Abdominal aorta normal caliber.  Post hysterectomy anatomy.  No pelvic lymphadenopathy.  Review of  bone windows demonstrates no aggressive osseous lesions.  IMPRESSION:  1.  Exuberant inflammatory process involving the sigmoid colon appears chronic.  This inflammation is present on comparison exam of 10/31/2011 but is worse now.  Differential includes chronic diverticulitis or chronic segmental colitis with differential including inflammatory bowel disease.  No evidence of macroperforation or abscess.  2.  The liver and gallbladder are partially imaged due to elevation of the right hemidiaphragm.   Original Report Authenticated By: Genevive Bi, M.D.      Ct Head Wo Contrast  02/05/2012    IMPRESSION:  1.  No acute intracranial findings. 2. Periventricular and corona radiata white matter hypodensities are most compatible with chronic ischemic microvascular white matter disease.   Original Report Authenticated By: Gaylyn Rong, M.D.    Mr Brain Wo Contrast  02/24/2012  This examination was performed at [facility]. The interpretation will be provided by [facility].   Original Report Authenticated By: Paulina Fusi, M.D.    Mr Cervical Spine Wo  Contrast  02/24/2012  This examination was performed at [facility]. The interpretation will be provided by [facility].   Original Report Authenticated By: Paulina Fusi, M.D.       1. Colitis   2. Diverticulitis   3. UTI (lower urinary tract infection)   4. Anemia   5. Nausea and vomiting    Plan admission   Devoria Albe, MD, FACEP    MDM          Ward Givens, MD 02/29/12 Rickey Primus

## 2012-02-29 NOTE — ED Notes (Signed)
Pt to CT

## 2012-03-01 DIAGNOSIS — K5732 Diverticulitis of large intestine without perforation or abscess without bleeding: Principal | ICD-10-CM

## 2012-03-01 DIAGNOSIS — R933 Abnormal findings on diagnostic imaging of other parts of digestive tract: Secondary | ICD-10-CM

## 2012-03-01 LAB — COMPREHENSIVE METABOLIC PANEL
ALT: 9 U/L (ref 0–35)
AST: 9 U/L (ref 0–37)
Albumin: 2.2 g/dL — ABNORMAL LOW (ref 3.5–5.2)
Alkaline Phosphatase: 104 U/L (ref 39–117)
Chloride: 101 mEq/L (ref 96–112)
Potassium: 3.8 mEq/L (ref 3.5–5.1)
Sodium: 139 mEq/L (ref 135–145)
Total Bilirubin: 0.2 mg/dL — ABNORMAL LOW (ref 0.3–1.2)

## 2012-03-01 LAB — CBC
MCH: 27.9 pg (ref 26.0–34.0)
MCHC: 31.9 g/dL (ref 30.0–36.0)
MCV: 87.4 fL (ref 78.0–100.0)
Platelets: 363 10*3/uL (ref 150–400)
RBC: 3.26 MIL/uL — ABNORMAL LOW (ref 3.87–5.11)
RDW: 15.5 % (ref 11.5–15.5)

## 2012-03-01 LAB — FERRITIN: Ferritin: 1032 ng/mL — ABNORMAL HIGH (ref 10–291)

## 2012-03-01 LAB — IRON AND TIBC: Iron: 32 ug/dL — ABNORMAL LOW (ref 42–135)

## 2012-03-01 MED ORDER — LABETALOL HCL 5 MG/ML IV SOLN
10.0000 mg | INTRAVENOUS | Status: DC | PRN
  Filled 2012-03-01: qty 4

## 2012-03-01 NOTE — Evaluation (Signed)
Physical Therapy Evaluation Patient Details Name: Katie Galvan MRN: [id number] DOB: [birth year] Today's Date: 03/01/2012 Time: 4098-1191 PT Time Calculation (min): 21 min  PT Assessment / Plan / Recommendation Clinical Impression  79 y.o. female with h/o O2 dependent COPD admitted from [facility] with falls, UTI, pancreatic mass, sigmoiditis. Pt transferred bed to chair with min A with RW, she fatigues quite quickly. She would benefit from acute PT to maximize safety and independence with mobility. SNF recommended 2* pt lives alone, no 24* assist available.     PT Assessment  Patient needs continued PT services    Follow Up Recommendations  SNF    Does the patient have the potential to tolerate intense rehabilitation      Barriers to Discharge Decreased caregiver support      Equipment Recommendations  None recommended by PT    Recommendations for Other Services OT consult   Frequency Min 3X/week    Precautions / Restrictions Precautions Precaution Comments: O2 dependent Restrictions Weight Bearing Restrictions: No   Pertinent Vitals/Pain *SaO2 94% on 2L O2 with activity Pain 7/10 abdominal, RN aware**      Mobility  Bed Mobility Bed Mobility: Supine to Sit Supine to Sit: HOB elevated;3: Mod assist Details for Bed Mobility Assistance: assist to raise trunk and scoot to EOB Transfers Transfers: Sit to Stand;Stand to Sit;Stand Pivot Transfers Sit to Stand: 4: Min assist;From bed;From chair/3-in-1;With upper extremity assist Stand to Sit: 4: Min assist;To chair/3-in-1;With upper extremity assist;With armrests Stand Pivot Transfers: 4: Min assist Details for Transfer Assistance: Good safety awareness, pushes up with UE assist using armrests, min A to initiate movement; SPT x 2 bed to 3-in-1 to recliner with RW, min A balance Ambulation/Gait Ambulation/Gait Assistance: Not tested (comment)    Exercises     PT Diagnosis: Difficulty walking;Generalized weakness  PT  Problem List: Decreased activity tolerance;Decreased mobility;Pain PT Treatment Interventions: Gait training;Functional mobility training;Therapeutic exercise;Therapeutic activities;Patient/family education   PT Goals Acute Rehab PT Goals PT Goal Formulation: With patient Time For Goal Achievement: 03/08/12 Potential to Achieve Goals: Fair Pt will go Supine/Side to Sit: with supervision PT Goal: Supine/Side to Sit - Progress: Goal set today Pt will go Sit to Stand: with supervision PT Goal: Sit to Stand - Progress: Goal set today Pt will Ambulate: 16 - 50 feet;with rolling walker PT Goal: Ambulate - Progress: Goal set today Pt will Perform Home Exercise Program: Independently PT Goal: Perform Home Exercise Program - Progress: Goal set today  Visit Information  Last PT Received On: 03/01/12 Assistance Needed: +1    Subjective Data  Subjective: I'd rather go to my home (than back to [facility]).  Patient Stated Goal: to get more strength   Prior Functioning  Home Living Lives With: Alone Available Help at Discharge: Skilled Nursing Facility Type of Home: Apartment Home Access: Level entry Home Layout: One level Bathroom Shower/Tub: Health visitor: Handicapped height Home Adaptive Equipment: Shower chair without back;Grab bars in shower;Raised toilet seat with rails;Walker - four wheeled Prior Function Level of Independence: Independent with assistive device(s) Communication Communication: No difficulties    Cognition  Cognition Overall Cognitive Status: Appears within functional limits for tasks assessed/performed Arousal/Alertness: Awake/alert Orientation Level: Appears intact for tasks assessed Behavior During Session: [facility] for tasks performed    Extremity/Trunk Assessment Right Upper Extremity Assessment RUE ROM/Strength/Tone: [facility] for tasks assessed Left Upper Extremity Assessment LUE ROM/Strength/Tone: WFL for tasks assessed Right Lower Extremity  Assessment RLE ROM/Strength/Tone: Within functional levels RLE Sensation: WFL - Light  Touch RLE Coordination: WFL - gross/fine motor Left Lower Extremity Assessment LLE ROM/Strength/Tone: Within functional levels LLE Sensation: WFL - Light Touch LLE Coordination: WFL - gross/fine motor   Balance Balance Balance Assessed: Yes Static Sitting Balance Static Sitting - Balance Support: Feet supported;No upper extremity supported Static Sitting - Level of Assistance: 6: Modified independent (Device/Increase time) Static Sitting - Comment/# of Minutes: 2  End of Session PT - End of Session Activity Tolerance: Patient tolerated treatment well;Patient limited by fatigue Patient left: in chair;with call bell/phone within reach Nurse Communication: Mobility status  GP     Ralene Bathe Kistler 03/01/2012, 10:11 AM [phone]

## 2012-03-01 NOTE — Care Management Note (Addendum)
    Page 1 of 2   03/26/2012     12:31:24 PM   CARE MANAGEMENT NOTE 03/26/2012  Patient:  Katie Galvan,Katie Galvan   Account Number:  [id number]  Date Initiated:  03/01/2012  Documentation initiated by:  Lanier Clam  Subjective/Objective Assessment:   ADMITTED W/ABD PAIN.NW:[id number],ONGE,XBMW.     Action/Plan:   FROM [facility]-SNF   Anticipated DC Date:  03/27/2012   Anticipated DC Plan:  SKILLED NURSING FACILITY      DC Planning Services  CM consult      Choice offered to / List presented to:             Status of service:  In process, will continue to follow Medicare Important Message given?   (If response is "NO", the following Medicare IM given date fields will be blank) Date Medicare IM given:   Date Additional Medicare IM given:    Discharge Disposition:    Per UR Regulation:  Reviewed for med. necessity/level of care/duration of stay  If discussed at Long Length of Stay Meetings, dates discussed:   03/07/2012  03/12/2012  03/14/2012  03/19/2012  03/21/2012  03/26/2012    Comments:  03/26/12 Katie Dreese RN,BSN NCM [id number] POD#12,TNA TAPERING,THEN D/C.D/C SNF.  03/25/12 Katie Zetino RN,BSN NCM [id number] POD#11,SEMI SOLID STOOL FROM OSTOMY-SMALL AMT.ADVANCED DIET TO SOFT.TNA.D/C PLAN SNF.  03/21/12 Katie Eckley RN,BSN NCM [id number] POD#7,OUTPUT FROM OSTOMY,ADVANCED DIET FULL LIQ.MD TO TALK TO FAMILY ABOUT TREATMENT PLANS,& OPTIONS.D/C PLAN SNF.  03/19/12 Katie Erck RN,BSN NCM [id number] POD#5-COLECTOMY/COLOSTOMY-NO OUTPUT.POST OP ILEUS.TNA,IV PAIN CONTROL.PT-AMBULATING.D/C PLAN SNF. 03/18/12 Katie Gilden RN,BSN NCM [id number] S/P SX,COLECTOMY,COLOSTOMY.PROLONGED ILEUS,NPO,TNA,NOT APPROPRIATE FOR LTAC-NOT IN INSURANCE PLAN.D/C PLAN SNF.  03/14/12 Katie Sigmund RN,BSN NCM [phone] FOR SX-COLECTOMY/COLOSTOMY TODAY.CURRENT D/C PLAN RETURN SNF.  03/12/12 Katie Economos RN,BSN NCM [phone] STILL LLQ PAIN.SX/GI FOLLOWING.NOTED IF CONDITION WORSENS FOR SX.TNA ONGOING,IV  ABX.D/C PLAN SNF.  03/11/12 Katie Coil RN,BSN NCM [phone] NOT LTAC APPROPRIATE-NOT IN INSURANCE PLAN,?SX.DIVERTICULITIS.LIQUID STOOLS,TNA,RESP FAILURE,UTI,IV ABX.D/C PLAN RETURN SNF.  03/07/12 Katie Reiber RN,BSN NCM [id number] DIVERTICULITIS,UTI.SX-CONSERVATIVE TREATMENT,TPN,BOWEL REST,IV ABX.D/C PLAN RETURN SNF.  03/05/12 Katie Ralls RN,BSN NCM [id number] ?SX.TPN.  03/04/12 Katie Petersheim RN,BSN NCM [phone] ABD PAIN,CT ABD/ PELVIS.GI/GEN SX FOLLOWING.IV ABX.PT/OT-SNF.  03/01/12 Katie Atlas RN,BSN NCM [id number] GI FOLLOWING.PT-SNF.

## 2012-03-01 NOTE — Progress Notes (Signed)
[facility] PROGRESS NOTE  Royann Wildasin ZOX:[id number] DOB: 1927-12-07 DOA: 02/29/2012 PCP: Michele Mcalpine, MD  Assessment/Plan  Sigmoiditis, acute on chronic.  DDx includes infectious, recurrent bacterial infection, possible underlying malignancy or inflammatory disorder.  - continue clear liquid diet - Zofran and phenergan prn  - C. Diff not drawn - Stool culture not drawn - O&P not drawn - Occult and lactoferrin not drawn - Continue Cipro/flagyl day 2 - Appreciate GI recommendations -  Repeat CT early next week and continue trending WBC  UTI:  - urine culture pending - cipro as above   HYPERTENSION/HLD, chronic diastolic/systolic CHF stable  - hold oral medications for now  - Restart as tolerated  - Appears less hypovolemic  - Add prn labetalol for SBP > 160   Chronic respiratory failure with hypoxia due to COPD, restrictive lung disease, stable.  - Continue 2L [place]  - Continue symbicort and spiriva with albuterol prn   Pancreatic mass, stable and being followed by GI.   Bilateral lower extremity ulcers, appear to be healing well  - Continue dressing changes daily   Leukocytosis, unspecified, likely related to UTI and sigmoiditis and rising today - Trend, tx as above   Iron deficiency anemia,  hgb down by 2mg /dl since yesterday.  May be dilutional - Tx for hemoglobin < 7  -  Iron studies sent by GI -  F/u occult stool  Thrombocytosis, likely reactive from infection and resolved  Dehydration suggested by levated BUN:Cr and clinical exam, improving with IVF - Continue IVF overnight  - Monitor carefully for hypervolemia   Diet: Clear liquid  Access: PIV  IVF: D5 1/2 NS at 50ml/h  Proph: heparin   Code Status: DNR  Family Communication: spoke with patient alone today Disposition Plan: likely to SNF pending medical improvement.  PT/OT recs in chart.  Consultants:  GI  Procedures:  CT abd/pelvis  Antibiotics:  cipro 2/27 >>  Flagyl 2/27 >>    HPI/Subjective:  Denies fevers, chills, headache.  Denies chest pain and shortness of breath.  Has some nausea but no vomiting.  Had one BM and continues to have very crampy LLQ pain.  Drinking liquids with difficulty.      Objective: Filed Vitals:   03/01/12 8119 03/01/12 0826 03/01/12 0952 03/01/12 1414  BP: 134/58   141/61  Pulse: 71   72  Temp: 97.7 F (36.5 C)   98.4 F (36.9 C)  TempSrc: Oral   Oral  Resp: 18   16  Height:      Weight:      SpO2: 98% 97% 94% 94%    Intake/Output Summary (Last 24 hours) at 03/01/12 1637 Last data filed at 03/01/12 1414  Gross per 24 hour  Intake  732.5 ml  Output    230 ml  Net  502.5 ml   Filed Weights   02/29/12 1900 03/01/12 0110  Weight: 65.1 kg (143 lb 8.3 oz) 64 kg (141 lb 1.5 oz)    Exam:   General:  CF, No acute distress  HEENT:  NCAT, MMM  Cardiovascular:   RRR, nl S1, S2 no mrg, 2+ pulses, warm extremities  Respiratory:  CTAB, no increased WOB  Abdomen:  Absent bowel sounds today, tender in the LLQ with some voluntary guarding, no rebound  MSK:   Normal tone and bulk, no LEE  Neuro:  Grossly intact  Skin:  Persistent mild skin tenting  Data Reviewed: Basic Metabolic Panel:  Recent Labs Lab 02/29/12 1535 03/01/12 0536  NA 136 139  K 4.0 3.8  CL 97 101  CO2 29 31  GLUCOSE 133* 130*  BUN 46* 33*  CREATININE 1.01 1.04  CALCIUM 9.7 8.8   Liver Function Tests:  Recent Labs Lab 02/29/12 1535 03/01/12 0536  AST 16 9  ALT 12 9  ALKPHOS 120* 104  BILITOT 0.3 0.2*  PROT 7.4 6.0  ALBUMIN 2.7* 2.2*   No results found for this basename: LIPASE, AMYLASE,  in the last 168 hours No results found for this basename: AMMONIA,  in the last 168 hours CBC:  Recent Labs Lab 02/29/12 1535 03/01/12 0536  WBC 11.8* 12.5*  NEUTROABS 8.4*  --   HGB 11.1* 9.1*  HCT 34.2* 28.5*  MCV 86.4 87.4  PLT 446* 363   Cardiac Enzymes: No results found for this basename: CKTOTAL, CKMB, CKMBINDEX, TROPONINI,  in  the last 168 hours BNP (last 3 results)  Recent Labs  08/02/11 1630 11/02/11 0623 11/03/11 0628  PROBNP 1420.0* 4539.0* 1925.0*   CBG: No results found for this basename: GLUCAP,  in the last 168 hours  No results found for this or any previous visit (from the past 240 hour(s)).   Studies: Ct Abdomen Pelvis W Contrast  02/29/2012  *RADIOLOGY REPORT*  Clinical Data: Left lower quadrant pain, abdominal pain  CT ABDOMEN AND PELVIS WITH CONTRAST  Technique:  Multidetector CT imaging of the abdomen and pelvis was performed following the standard protocol during bolus administration of intravenous contrast.  Contrast: 25mL OMNIPAQUE IOHEXOL 300 MG/ML  SOLN, 80mL OMNIPAQUE IOHEXOL 300 MG/ML  SOLN  Comparison: CT 10/31/2011  Findings: Limited view of the lung bases are unchanged.  There is elevation of the right hemidiaphragm.  No focal hepatic lesion.  Entire liver is not imaged.  A portion of the gallbladder is imaged which appears normal.  The pancreas, spleen, adrenal glands, and kidneys are unchanged. Nonobstructing calculus in the lower pole of the left kidney.  There is a mixed density cysts in the left and right kidney which are not changed in size compared to prior.  The stomach, small bowel, and cecum are normal.  Moderate volume stool throughout the colon.  There is an exuberant inflammatory response associated with the proximal sigmoid colon which occurs over approximately 10 cm at the site of prior inflammation.  There is some fat within the wall of the colon suggesting chronic infection/inflammation.  The bowel wall is thickened and edematous measuring 11 mm in thickness.  There is no evidence of frank abscess or intraperitoneal free air.  There is stool in the rectum.  Abdominal aorta normal caliber.  Post hysterectomy anatomy.  No pelvic lymphadenopathy.  Review of  bone windows demonstrates no aggressive osseous lesions.  IMPRESSION:  1.  Exuberant inflammatory process involving the sigmoid  colon appears chronic.  This inflammation is present on comparison exam of 10/31/2011 but is worse now.  Differential includes chronic diverticulitis or chronic segmental colitis with differential including inflammatory bowel disease.  No evidence of macroperforation or abscess.  2.  The liver and gallbladder are partially imaged due to elevation of the right hemidiaphragm.   Original Report Authenticated By: Genevive Bi, M.D.     Scheduled Meds: . budesonide-formoterol  1 puff Inhalation BID  . ciprofloxacin  400 mg Intravenous Q12H  . heparin  5,000 Units Subcutaneous Q8H  . metronidazole  500 mg Intravenous Q8H  . sodium chloride  3 mL Intravenous Q12H  . tiotropium  18 mcg Inhalation Daily  Continuous Infusions: . dextrose 5 % and 0.45% NaCl 75 mL/hr at 03/01/12 1242    Principal Problem:   Diverticulitis Active Problems:   HYPERTENSION   UTI (lower urinary tract infection)   Chronic respiratory failure with hypoxia   Diastolic CHF, chronic   Pancreatic mass   Anemia   Wound of left leg   Leukocytosis, unspecified   Thrombocytosis   Nonspecific (abnormal) findings on radiological and other examination of gastrointestinal tract    Time spent: 30 min    Dilynn Munroe, [facility]  [facility] Pager [phone]. If 7PM-7AM, please contact night-coverage at [email or web address], password [facility] 03/01/2012, 4:37 PM  LOS: 1 day

## 2012-03-01 NOTE — Evaluation (Signed)
Occupational Therapy Evaluation Patient Details Name: Katie Galvan MRN: [id number] DOB: August 14, 1927 Today's Date: 03/01/2012 Time: 4098-1191 OT Time Calculation (min): 13 min  OT Assessment / Plan / Recommendation Clinical Impression  This 80 year old female was admitted for diverticulitis.  She has been at [facility] for rehab and has had several falls including one for head trauma several weeks prior to admission.  Pt would benefit from skilled OT to increase activity tolerance and safety with adls.  Goals in acute are min guard    OT Assessment  Patient needs continued OT Services    Follow Up Recommendations  SNF    Barriers to Discharge      Equipment Recommendations  None recommended by OT    Recommendations for Other Services    Frequency  Min 2X/week    Precautions / Restrictions Precautions Precautions: Fall Precaution Comments: O2 dependent Restrictions Weight Bearing Restrictions: No   Pertinent Vitals/Pain No pain.  Pt on 2 liters 02, no distress    ADL  Grooming: Wash/dry hands;Set up Where Assessed - Grooming: Unsupported sitting Upper Body Bathing: Set up Where Assessed - Upper Body Bathing: Unsupported sitting Lower Body Bathing: Minimal assistance Where Assessed - Lower Body Bathing: Supported sit to stand Upper Body Dressing: Minimal assistance (iv) Where Assessed - Upper Body Dressing: Unsupported sitting Lower Body Dressing: Minimal assistance Where Assessed - Lower Body Dressing: Supported sit to stand Toilet Transfer: Minimal assistance Toilet Transfer Method: Surveyor, minerals: Materials engineer and Hygiene: Min guard Where Assessed - Engineer, mining and Hygiene: Sit to stand from 3-in-1 or toilet Equipment Used: Rolling walker Transfers/Ambulation Related to ADLs: pt has good safety with walker  fatiques quickly    OT Diagnosis: Generalized weakness  OT Problem List:  Decreased strength;Decreased activity tolerance;Impaired balance (sitting and/or standing) OT Treatment Interventions: Self-care/ADL training;DME and/or AE instruction;Patient/family education;Balance training   OT Goals Acute Rehab OT Goals OT Goal Formulation: With patient Time For Goal Achievement: 03/15/12 Potential to Achieve Goals: Good ADL Goals Pt Will Perform Grooming: with min assist;Standing at sink (min guard) ADL Goal: Grooming - Progress: Goal set today Pt Will Transfer to Toilet: with min assist;Ambulation;3-in-1 (min guard) ADL Goal: Toilet Transfer - Progress: Goal set today Pt Will Perform Toileting - Hygiene: with min assist;Sit to stand from 3-in-1/toilet (min guard) ADL Goal: Toileting - Hygiene - Progress: Goal set today Miscellaneous OT Goals Miscellaneous OT Goal #1: Pt will complete LB adls with set up, min guard for sit to stand taking rest breaks for energy conservation OT Goal: Miscellaneous Goal #1 - Progress: Goal set today  Visit Information  Last OT Received On: 03/01/12 Assistance Needed: +1    Subjective Data  Subjective: im just so tired Patient Stated Goal: agreeable to ot  none stated   Prior Functioning     Home Living Lives With: Alone Available Help at Discharge: Skilled Nursing Facility Type of Home: Apartment Home Access: Level entry Home Layout: One level Bathroom Shower/Tub: Health visitor: Handicapped height Home Adaptive Equipment: Shower chair without back;Grab bars in shower;Raised toilet seat with rails;Walker - four wheeled Additional Comments: has been at [facility] for rehab Prior Function Level of Independence: Independent with assistive device(s) Communication Communication: No difficulties         Vision/Perception     Cognition  Cognition Overall Cognitive Status: Appears within functional limits for tasks assessed/performed Arousal/Alertness: Awake/alert Orientation Level: Appears intact  for tasks assessed Behavior During Session:  WFL for tasks performed    Extremity/Trunk Assessment Right Upper Extremity Assessment RUE ROM/Strength/Tone: [facility] for tasks assessed Left Upper Extremity Assessment LUE ROM/Strength/Tone: WFL for tasks assessed      Mobility Bed Mobility Sit to Supine: 4: Min assist Details for Bed Mobility Assistance: assist for LEs Transfers Sit to Stand: 4: Min assist;From chair/3-in-1;With upper extremity assist Stand to Sit: 4: Min assist;To chair/3-in-1;With upper extremity assist;With armrests     Exercise     Balance Balance Balance Assessed: Yes Static Sitting Balance Static Sitting - Balance Support: Feet supported;No upper extremity supported Static Sitting - Level of Assistance: 6: Modified independent (Device/Increase time) Static Sitting - Comment/# of Minutes: 2   End of Session OT - End of Session Activity Tolerance: Patient limited by fatigue Patient left: in bed;with call bell/phone within reach;with bed alarm set Nurse Communication: Other (comment) (wants a do not disturb sign for one hour)  GO     Katie Galvan 03/01/2012, 12:49 PM [name], OTR/L [phone] 03/01/2012

## 2012-03-01 NOTE — Consult Note (Signed)
Referring Provider: No ref. provider found Primary Care Physician:  Michele Mcalpine, MD Primary Gastroenterologist:  Dr. Juanda Chance  Reason for Consultation:  Diverticulitis; abnormal CT scan  HPI: Katie Galvan is a 79 y.o. female with history of likely ischemic cardiomyopathy with LVEF 30%, restrictive lung disease due to elevated diaphragm on on O2 at home, COPD, GERD, endocarditis, recurrent falls, recurrent UTIs, and hx of diverticulitis who presented to [facility] ED on 2/27 with LLQ abdominal pain, N/V, and fatigue.   The patient was last at their baseline health many months ago. For the last several months, she has been more ill than well. She was hospitalized in late early august with a fall, then in late October with diverticulitis of the sigmoid colon at which time she was found to have a pancreatic mass. She has been followed very closely by Drs. Nadel and Bensimhon, and her last GI visit was with Dr. Christella Hartigan in December who recommended against endoscopy/colonoscopy at that time.  He said that mass in pancreas was likely benign for several reasons, including normal CA19-9, but recommended follow-up with repeat imagining in 6 months.  Over the last few months, she has had 2 or 3 UTIs and several falls. She has been in rehab for the last 5 weeks and has had a trip to the ER for fall with head trauma just a few weeks ago.   These current symptoms started several days ago with an 8/10 crampy left lower quadrant pain that is similar to her previous diverticulitis. The pain comes and goes and does not radiate.  Nothing brings it on and rubbing it seems to make it improve. She has been becoming progressively weaker and for the last two to three days she has not been able to participate in therapies. For the last two days she has had nausea with vomiting of nonbilious, nonbloody, non-coffee ground emesis. She reports that her BMs have been normal without blood. Denies fevers and symptoms of UTI such as urgency,  frequency, dysuria, and incontinence.   In the ER, she was found to have an elevated WBC at 11.8 and CT abd/pelvis demonstrated "Exuberant inflammatory process involving the sigmoid colon appears chronic. This inflammation is present on comparison exam  of 10/31/2011 but is worse now."  She was given ceftriaxone, then cipro and flagyl in the ER and was admitted for diverticulitis.  She is still on cipro and flagyl.  Looking back at previous CT scans dating back to 2012 showed "nonspecific colitis with infectious, inflammatory, and ischemic etiologies in the differential."  According to Dr. Christella Hartigan note in 12/2011, her diverticulitis was improved at that time after 2 rounds of abx.  When speaking with the patient today, she does not remember being in the hospital for diverticulitis back in the fall.  CEA in 11/2011 was normal at 2.7.  Just of note, she is anemic as well with Hgb of 11.1 grams on admission, down to 9.1 grams today.  Normal MCV.    Past Medical History  Diagnosis Date  . Disorders of diaphragm     right diaphragm elevation  . Unspecified essential hypertension   . Endocarditis, valve unspecified, unspecified cause   . Cardiac dysrhythmia, unspecified   . Unspecified venous (peripheral) insufficiency   . Edema   . Pure hypercholesterolemia   . Other dysphagia   . Diverticulosis of colon (without mention of hemorrhage)   . Benign neoplasm of colon   . Osteoarthrosis, unspecified whether generalized or localized, unspecified site   .  Lumbago   . Chronic pain syndrome   . Disorder of bone and cartilage, unspecified   . Depressive disorder, not elsewhere classified   . Anemia, unspecified   . Skin cancer   . Systolic heart failure     echo 10/10/10 EF 30%  . Hiatal hernia   . Gastritis   . COPD (chronic obstructive pulmonary disease)   . Lower extremity ulceration     dressing changes daily    Past Surgical History  Procedure Laterality Date  . Appendectomy    .  Breast surgery      biopsy  . Left wrist surgery    . Bilateral cataracts    . Hip surgery      dr. Rayburn Ma, twice because the bones were spongy  . Tonsillectomy    . Adenoidectomy      Prior to Admission medications   Medication Sig Start Date End Date Taking? Authorizing Provider  aspirin 81 MG EC tablet Take 81 mg by mouth every morning.    Yes Historical Provider, MD  bisoprolol (ZEBETA) 5 MG tablet Take 2.5 mg by mouth every morning.  01/01/12  Yes Amy D Clegg, NP  budesonide-formoterol (SYMBICORT) 160-4.5 MCG/ACT inhaler Inhale 1 puff into the lungs 2 (two) times daily.   Yes Historical Provider, MD  Calcium Carbonate-Vit D-Min (CALTRATE 600+D PLUS) 600-400 MG-UNIT per tablet Chew 1 tablet by mouth 2 (two) times daily.    Yes Historical Provider, MD  cholecalciferol (VITAMIN D) 1000 UNITS tablet Take 2,000 Units by mouth every morning.    Yes Historical Provider, MD  citalopram (CELEXA) 20 MG tablet Take 20 mg by mouth every morning. 10/09/11  Yes Michele Mcalpine, MD  ferrous sulfate 325 (65 FE) MG tablet Take 325 mg by mouth 2 (two) times daily.     Yes Historical Provider, MD  furosemide (LASIX) 40 MG tablet Take 40 mg by mouth every morning. 12/14/11  Yes Amy D Clegg, NP  lisinopril (PRINIVIL,ZESTRIL) 5 MG tablet Take 2.5 mg by mouth every morning.  11/23/11  Yes Michele Mcalpine, MD  loratadine (CLARITIN) 10 MG tablet Take 10 mg by mouth every morning.    Yes Historical Provider, MD  morphine (MS CONTIN) 15 MG 12 hr tablet Take 15 mg by mouth 2 (two) times daily.   Yes Historical Provider, MD  morphine (MSIR) 15 MG tablet Take 15 mg by mouth every 8 (eight) hours as needed for pain. Pain.   Yes Historical Provider, MD  Multiple Vitamin (MULTIVITAMIN WITH MINERALS) TABS Take 1 tablet by mouth every morning.    Yes Historical Provider, MD  Naproxen Sodium (ALEVE) 220 MG CAPS Take 1 capsule by mouth 2 (two) times daily.   Yes Historical Provider, MD  ondansetron (ZOFRAN-ODT) 4 MG  disintegrating tablet Take 4 mg by mouth every 8 (eight) hours as needed for nausea.   Yes Historical Provider, MD  Probiotic Product (ALIGN) 4 MG CAPS Take 1 capsule by mouth every morning.   Yes Historical Provider, MD  spironolactone (ALDACTONE) 25 MG tablet Take 12.5 mg by mouth every morning. 01/01/12  Yes Amy D Clegg, NP  tiotropium (SPIRIVA) 18 MCG inhalation capsule Place 18 mcg into inhaler and inhale 2 (two) times daily.   Yes Historical Provider, MD  vitamin C (ASCORBIC ACID) 500 MG tablet Take 500 mg by mouth 2 (two) times daily.    Yes Historical Provider, MD  zinc gluconate 50 MG tablet Take 100 mg by mouth every morning.  Yes Historical Provider, MD    Current Facility-Administered Medications  Medication Dose Route Frequency Provider Last Rate Last Dose  . albuterol (PROVENTIL) (5 MG/ML) 0.5% nebulizer solution 2.5 mg  2.5 mg Nebulization Q2H PRN Renae Fickle, MD      . budesonide-formoterol (SYMBICORT) 160-4.5 MCG/ACT inhaler 1 puff  1 puff Inhalation BID Renae Fickle, MD   1 puff at 03/01/12 [phone]  . ciprofloxacin (CIPRO) IVPB 400 mg  400 mg Intravenous Q12H Loma Messing [place], PHARMD   400 mg at 03/01/12 2952  . dextrose 5 %-0.45 % sodium chloride infusion   Intravenous Continuous Renae Fickle, MD 75 mL/hr at 02/29/12 1950    . heparin injection 5,000 Units  5,000 Units Subcutaneous Q8H Renae Fickle, MD   5,000 Units at 03/01/12 0515  . metroNIDAZOLE (FLAGYL) IVPB 500 mg  500 mg Intravenous Q8H Renae Fickle, MD   500 mg at 03/01/12 0333  . morphine 4 MG/ML injection 4 mg  4 mg Intravenous Q4H PRN Renae Fickle, MD      . ondansetron [facility]) tablet 4 mg  4 mg Oral Q6H PRN Renae Fickle, MD       Or  . ondansetron (ZOFRAN) injection 4 mg  4 mg Intravenous Q6H PRN Renae Fickle, MD      . sodium chloride 0.9 % injection 3 mL  3 mL Intravenous Q12H Renae Fickle, MD   3 mL at 02/29/12 2151  . tiotropium (SPIRIVA) inhalation capsule 18 mcg  18 mcg  Inhalation Daily Renae Fickle, MD   18 mcg at 03/01/12 8413    Allergies as of 02/29/2012 - Review Complete 02/29/2012  Allergen Reaction Noted  . Sulfonamide derivatives Swelling     Family History  Problem Relation Age of Onset  . Diabetes Mother   . Heart disease Father   . Lung cancer Sister   . Alcohol abuse Sister   . Alcohol abuse Brother     History   Social History  . Marital Status: Single    Spouse Name: N/A    Number of Children: 2  . Years of Education: N/A   Occupational History  . Retired  - worked at a [employer] sales [employer]    Social History Main Topics  . Smoking status: Never Smoker   . Smokeless tobacco: Never Used  . Alcohol Use: No  . Drug Use: No  . Sexually Active: Not Currently   Other Topics Concern  . Not on file   Social History Narrative   Pt has independent apartment at [place].  Rehab for last 5 weeks.  Uses a walker to get around.  She has had some falls since in rehab.      Review of Systems: Ten point ROS is O/W negative except as mentioned in HPI.  Physical Exam: Vital signs in last 24 hours: Temp:  [97.7 F (36.5 C)-98.4 F (36.9 C)] 97.7 F (36.5 C) (02/28 0624) Pulse Rate:  [65-71] 71 (02/28 0624) Resp:  [14-24] 18 (02/28 0624) BP: (109-138)/(50-76) 134/58 mmHg (02/28 0624) SpO2:  [94 %-100 %] 94 % (02/28 0952) Weight:  [143 lb 8.3 oz (65.1 kg)] 143 lb 8.3 oz (65.1 kg) (02/27 1900) Last BM Date: 02/29/12 General:   Alert, Well-developed, well-nourished, pleasant and cooperative in NAD Head:  Normocephalic and atraumatic. Eyes:  Sclera clear, no icterus.  Conjunctiva pink. Ears:  Normal auditory acuity. Mouth:  No deformity or lesions.   Lungs:  Clear throughout to auscultation.  No wheezes, crackles, or rhonchi.  Decreased air  movement B/L. Heart:  Regular rate and rhythm; 3/6 murmur noted Abdomen:  Soft, BS quiet/hypoactive, fullness and moderate TTP in LLQ extending along entire left side.  No rebound noted. Rectal:  Deferred   Msk:  Symmetrical without gross deformities. Pulses:  Normal pulses noted. Extremities:  Without clubbing or edema.  Some venous stasis changes noted. Neurologic:  Alert and  oriented x4;  grossly normal neurologically. Skin:  Intact without significant lesions or rashes. Psych:  Alert and cooperative. Normal mood and affect.  Slightly confused at times.  Intake/Output from previous day: 02/27 0701 - 02/28 0700 In: 612.5 [I.V.:412.5; IV Piggyback:200] Out: 130 [Urine:130] Intake/Output this shift: Total I/O In: -  Out: 100 [Urine:100]  Lab Results:  Recent Labs  02/29/12 1535 03/01/12 0536  WBC 11.8* 12.5*  HGB 11.1* 9.1*  HCT 34.2* 28.5*  PLT 446* 363   BMET  Recent Labs  02/29/12 1535 03/01/12 0536  NA 136 139  K 4.0 3.8  CL 97 101  CO2 29 31  GLUCOSE 133* 130*  BUN 46* 33*  CREATININE 1.01 1.04  CALCIUM 9.7 8.8   LFT  Recent Labs  03/01/12 0536  PROT 6.0  ALBUMIN 2.2*  AST 9  ALT 9  ALKPHOS 104  BILITOT 0.2*    Studies/Results: Ct Abdomen Pelvis W Contrast  02/29/2012  *RADIOLOGY REPORT*  Clinical Data: Left lower quadrant pain, abdominal pain  CT ABDOMEN AND PELVIS WITH CONTRAST  Technique:  Multidetector CT imaging of the abdomen and pelvis was performed following the standard protocol during bolus administration of intravenous contrast.  Contrast: 25mL OMNIPAQUE IOHEXOL 300 MG/ML  SOLN, 80mL OMNIPAQUE IOHEXOL 300 MG/ML  SOLN  Comparison: CT 10/31/2011  Findings: Limited view of the lung bases are unchanged.  There is elevation of the right hemidiaphragm.  No focal hepatic lesion.  Entire liver is not imaged.  A portion of the gallbladder is imaged which appears normal.  The pancreas, spleen, adrenal glands, and kidneys are unchanged. Nonobstructing calculus in the lower pole of the left kidney.  There is a mixed density cysts in the left and right kidney which are not changed in size compared to prior.  The stomach, small bowel, and cecum are normal.   Moderate volume stool throughout the colon.  There is an exuberant inflammatory response associated with the proximal sigmoid colon which occurs over approximately 10 cm at the site of prior inflammation.  There is some fat within the wall of the colon suggesting chronic infection/inflammation.  The bowel wall is thickened and edematous measuring 11 mm in thickness.  There is no evidence of frank abscess or intraperitoneal free air.  There is stool in the rectum.  Abdominal aorta normal caliber.  Post hysterectomy anatomy.  No pelvic lymphadenopathy.  Review of  bone windows demonstrates no aggressive osseous lesions.  IMPRESSION:  1.  Exuberant inflammatory process involving the sigmoid colon appears chronic.  This inflammation is present on comparison exam of 10/31/2011 but is worse now.  Differential includes chronic diverticulitis or chronic segmental colitis with differential including inflammatory bowel disease.  No evidence of macroperforation or abscess.  2.  The liver and gallbladder are partially imaged due to elevation of the right hemidiaphragm.   Original Report Authenticated By: Genevive Bi, M.D.     IMPRESSION:  -Diverticulitis:  ? Acute on chronic vs some type of underlying colitis.  Findings of colitis seen on CT scan dating back to 2012 -Normocytic anemia -Pulmonary HTN with O2 dependence -CHF  PLAN: -Continue with IV abx (cipro and flagyl).  IVF's.  Clears ok for now, but no further advancement in diet. -May need repeat CT scan at the beginning of next week.   -? Flex sig vs BE once there is some improvement. -Consider surgical consult. -Will check iron studies. I have reviewed the above note, examined the patient and agree with plan of treatment. CT scan shows an on going inflammatory process which has never cleared  in between the episodes of acute  pain, and it likely represents thickened sigmoid colon . How severe the acute inflammation is will have to be assessed day-by-day  by repeating the CT scan  and watching WBC etc.. I would eventually obtain a Barium enema to assess for obstruction  . She is an extremety high risk for moderate sedation to consider colonoscopy or sigmoid resection. We will follow with you.  Willa [facility] Pager # [phone], JESSICA D.  03/01/2012, 11:04 AM  Pager number [phone]

## 2012-03-01 NOTE — Clinical Social Work Psychosocial (Unsigned)
     Clinical Social Work Department BRIEF PSYCHOSOCIAL ASSESSMENT 03/01/2012  Patient:  Stmartin,Alizandra     Account Number:  [id number]     Admit date:  02/29/2012  Clinical Social Worker:  Hattie Perch  Date/Time:  03/01/2012 12:00 M  Referred by:  Physician  Date Referred:  03/01/2012 Referred for  SNF Placement  SNF Placement   Other Referral:   Interview type:  Family Other interview type:    PSYCHOSOCIAL DATA Living Status:  FACILITY Admitted from facility:  [facility] Level of care:  Skilled Nursing Facility Primary support name:  mike Dimond Primary support relationship to patient:  CHILD, ADULT Degree of support available:   good    CURRENT CONCERNS Current Concerns  Post-Acute Placement   Other Concerns:    SOCIAL WORK ASSESSMENT / PLAN CSW spoke with patient's son, [name], he confirms that patient is a resident at [facility] independent living but has been in the snf portion for the past five weeks. he confirms that patient will return to the snf portion of [facility].   Assessment/plan status:   Other assessment/ plan:   Information/referral to community resources:    PATIENTS/FAMILYS RESPONSE TO PLAN OF CARE: agreeable to return to [facility].

## 2012-03-02 ENCOUNTER — Inpatient Hospital Stay (HOSPITAL_COMMUNITY): Payer: Medicare Other

## 2012-03-02 DIAGNOSIS — K529 Noninfective gastroenteritis and colitis, unspecified: Secondary | ICD-10-CM

## 2012-03-02 DIAGNOSIS — I5042 Chronic combined systolic (congestive) and diastolic (congestive) heart failure: Secondary | ICD-10-CM

## 2012-03-02 LAB — COMPREHENSIVE METABOLIC PANEL
ALT: 8 U/L (ref 0–35)
AST: 12 U/L (ref 0–37)
CO2: 29 mEq/L (ref 19–32)
Chloride: 105 mEq/L (ref 96–112)
Creatinine, Ser: 0.96 mg/dL (ref 0.50–1.10)
GFR calc non Af Amer: 54 mL/min — ABNORMAL LOW (ref >90)
Total Bilirubin: 0.3 mg/dL (ref 0.3–1.2)

## 2012-03-02 LAB — URINE CULTURE

## 2012-03-02 LAB — CBC
MCH: 26.8 pg (ref 26.0–34.0)
MCHC: 30.7 g/dL (ref 30.0–36.0)
Platelets: 354 10*3/uL (ref 150–400)
RBC: 3.58 MIL/uL — ABNORMAL LOW (ref 3.87–5.11)

## 2012-03-02 MED ORDER — ONDANSETRON HCL 4 MG/2ML IJ SOLN
4.0000 mg | Freq: Four times a day (QID) | INTRAMUSCULAR | Status: DC
  Administered 2012-03-02 – 2012-03-04 (×9): 4 mg via INTRAVENOUS
  Filled 2012-03-02 (×9): qty 2

## 2012-03-02 MED ORDER — MORPHINE SULFATE 2 MG/ML IJ SOLN: 0.5000 mg | INTRAMUSCULAR | Status: DC | PRN

## 2012-03-02 MED ORDER — PHENAZOPYRIDINE HCL 100 MG PO TABS
100.0000 mg | ORAL_TABLET | Freq: Every day | ORAL | Status: AC
  Administered 2012-03-02 – 2012-03-03 (×2): 100 mg via ORAL
  Filled 2012-03-02 (×3): qty 1

## 2012-03-02 MED ORDER — MORPHINE SULFATE 2 MG/ML IJ SOLN
2.0000 mg | INTRAMUSCULAR | Status: DC | PRN
  Administered 2012-03-02 – 2012-03-03 (×4): 2 mg via INTRAVENOUS
  Administered 2012-03-03: 4 mg via INTRAVENOUS
  Administered 2012-03-03 – 2012-03-04 (×5): 2 mg via INTRAVENOUS
  Administered 2012-03-05 (×2): 4 mg via INTRAVENOUS
  Filled 2012-03-02 (×4): qty 1
  Filled 2012-03-02 (×2): qty 2
  Filled 2012-03-02 (×3): qty 1
  Filled 2012-03-02: qty 2
  Filled 2012-03-02 (×2): qty 1

## 2012-03-02 NOTE — Progress Notes (Signed)
Subjective Very weak  Objective:Diverticulitis, pt is extremely weak, unable to sit up to eat her clear liquid diet, VS's stable, no fever or diarrhea Vital signs in last 24 hours: Temp:  [97.4 F (36.3 C)-98.4 F (36.9 C)] 97.4 F (36.3 C) (03/01 0612) Pulse Rate:  [72-77] 77 (03/01 0612) Resp:  [16-20] 20 (03/01 0612) BP: (139-152)/(61-76) 139/76 mmHg (03/01 0612) SpO2:  [94 %-100 %] 99 % (03/01 0830) Last BM Date: 03/01/12 General:   Alert,  pleasant, ill appearing Head:  Normocephalic and atraumatic. Eyes:  Sclera clear, no icterus.   Conjunctiva pink. Mouth:  No deformity or lesions, dentition normal. Neck:  Supple; no masses or thyromegaly. Heart:  Regular rapid rate and rhythm; no murmurs, clicks, rubs,  or gallops. Lungs: decreased breath sounds, )2 nasal prongs Abdomen:  Very tender diffusely, mainly in LLQ, no rebound, unchanged from yesterday  Msk:  Symmetrical without gross deformities. Normal posture. Pulses:  Normal pulses noted. Extremities:  Without clubbing or edema. Neurologic:  Alert and  oriented x4;  grossly normal neurologically. Skin: eschymoses  Intake/Output from previous day: 02/28 0701 - 03/01 0700 In: 985 [P.O.:120; I.V.:865] Out: 155 [Urine:155] Intake/Output this shift: Total I/O In: -  Out: 100 [Urine:100]  Lab Results:  Recent Labs  02/29/12 1535 03/01/12 0536 03/02/12 0542  WBC 11.8* 12.5* 11.4*  HGB 11.1* 9.1* 9.6*  HCT 34.2* 28.5* 31.3*  PLT 446* 363 354   BMET  Recent Labs  02/29/12 1535 03/01/12 0536 03/02/12 0542  NA 136 139 142  K 4.0 3.8 3.9  CL 97 101 105  CO2 29 31 29   GLUCOSE 133* 130* 95  BUN 46* 33* 21  CREATININE 1.01 1.04 0.96  CALCIUM 9.7 8.8 8.8   LFT  Recent Labs  03/02/12 0542  PROT 6.2  ALBUMIN 2.3*  AST 12  ALT 8  ALKPHOS 91  BILITOT 0.3   PT/INR No results found for this basename: LABPROT, INR,  in the last 72 hours Hepatitis Panel No results found for this basename: HEPBSAG, HCVAB,  HEPAIGM, HEPBIGM,  in the last 72 hours  Studies/Results: Ct Abdomen Pelvis W Contrast  02/29/2012  *RADIOLOGY REPORT*  Clinical Data: Left lower quadrant pain, abdominal pain  CT ABDOMEN AND PELVIS WITH CONTRAST  Technique:  Multidetector CT imaging of the abdomen and pelvis was performed following the standard protocol during bolus administration of intravenous contrast.  Contrast: 25mL OMNIPAQUE IOHEXOL 300 MG/ML  SOLN, 80mL OMNIPAQUE IOHEXOL 300 MG/ML  SOLN  Comparison: CT 10/31/2011  Findings: Limited view of the lung bases are unchanged.  There is elevation of the right hemidiaphragm.  No focal hepatic lesion.  Entire liver is not imaged.  A portion of the gallbladder is imaged which appears normal.  The pancreas, spleen, adrenal glands, and kidneys are unchanged. Nonobstructing calculus in the lower pole of the left kidney.  There is a mixed density cysts in the left and right kidney which are not changed in size compared to prior.  The stomach, small bowel, and cecum are normal.  Moderate volume stool throughout the colon.  There is an exuberant inflammatory response associated with the proximal sigmoid colon which occurs over approximately 10 cm at the site of prior inflammation.  There is some fat within the wall of the colon suggesting chronic infection/inflammation.  The bowel wall is thickened and edematous measuring 11 mm in thickness.  There is no evidence of frank abscess or intraperitoneal free air.  There is stool in the rectum.  Abdominal aorta normal caliber.  Post hysterectomy anatomy.  No pelvic lymphadenopathy.  Review of  bone windows demonstrates no aggressive osseous lesions.  IMPRESSION:  1.  Exuberant inflammatory process involving the sigmoid colon appears chronic.  This inflammation is present on comparison exam of 10/31/2011 but is worse now.  Differential includes chronic diverticulitis or chronic segmental colitis with differential including inflammatory bowel disease.  No  evidence of macroperforation or abscess.  2.  The liver and gallbladder are partially imaged due to elevation of the right hemidiaphragm.   Original Report Authenticated By: Genevive Bi, M.D.    Dg Abd Portable 2v  03/02/2012  *RADIOLOGY REPORT*  Clinical Data: Abdominal pain and distention  PORTABLE ABDOMEN - 2 VIEW  Comparison: 02/29/2012  Findings: Enteric contrast material was identified within the ascending colon and proximal transverse colon.  There are no dilated loops of small bowel or fluid levels identified.  Scoliosis deformity is noted within the lumbar spine.  There is multilevel degenerative disc disease within the lumbar spine. Previous right hip arthroplasty.  IMPRESSION:  1.  Nonobstructive bowel gas pattern. 2.  Enteric contrast material is present within the colon.   Original Report Authenticated By: Signa Kell, M.D.      ASSESSMENT:   Principal Problem:   Diverticulitis Active Problems:   HYPERTENSION   UTI (lower urinary tract infection)   Chronic respiratory failure with hypoxia   Diastolic CHF, chronic   Pancreatic mass   Anemia   Wound of left leg   Leukocytosis, unspecified   Nonspecific (abnormal) findings on radiological and other examination of gastrointestinal tract     PLAN:   Active inflammatory process in the LLQ, unchanged clinically on Cipro and Flagyl. Will plan to repeat CT scan on Mon 03/04/2012 to look for resolution  Of the inflammatory process. She is not a surgical candidate at this time.     LOS: 2 days   Lina Sar  03/02/2012, 10:53 AM

## 2012-03-02 NOTE — Progress Notes (Signed)
[facility] PROGRESS NOTE  Katie Galvan WUJ:[id number] DOB: 1927/12/10 DOA: 02/29/2012 PCP: Michele Mcalpine, MD  Assessment/Plan  Sigmoiditis, acute on chronic.  DDx includes infectious, recurrent bacterial infection, possible underlying malignancy or inflammatory disorder.  Increased abdominal pain and nausea today.   - NPO and KUB to check for free air:  Neg.  Will restart clears. - Schedule Zofran and continue prn zofran  - C. Diff not drawn - Stool culture not drawn - O&P not drawn - Occult and lactoferrin not drawn - Continue Cipro/flagyl day 3 - Appreciate GI recommendations -  Repeat CT on 3/3 and continue trending WBC  UTI:  UA with lg LE, TNTC WBC.   - urine culture with > 100K GNR.   - cipro as above   HYPERTENSION/HLD, chronic diastolic/systolic CHF stable.  - hold oral medications for now  - Restart as tolerated  - Appears euvolemic - continue prn labetalol for SBP > 160  - Daily weights stable and strict I/O  Chronic respiratory failure with hypoxia due to COPD, restrictive lung disease, stable.  - Continue 2L [place]  - Continue symbicort and spiriva with albuterol prn   Pancreatic mass, stable and being followed by GI.   Bilateral lower extremity ulcers, appear to be healing well  - Continue dressing changes daily   Leukocytosis, unspecified, likely related to UTI and sigmoiditis and rising today - Trend, tx as above   Iron deficiency anemia,  hgb stable.  l - Tx for hemoglobin < 7  -  Iron studies sent by GI:  Ferritin elevated likely due to inflammation, but iron level low. -  F/u occult stool  Dehydration suggested by levated BUN:Cr and clinical exam, improving with IVF - Continue IVF at current rate  - Monitor carefully for hypervolemia   Diet: Clear liquid  Access: PIV  IVF: D5 1/2 NS at 74ml/h  Proph: heparin   Code Status: DNR  Family Communication: spoke with patient alone today Disposition Plan: likely to SNF pending medical  improvement.  PT/OT recs in chart.  Consultants:  GI  Procedures:  CT abd/pelvis  Antibiotics:  cipro 2/27 >>  Flagyl 2/27 >>   HPI/Subjective:  Denies fevers, chills, headache.  Denies chest pain and shortness of breath.  Had nausea with retching this morning.  More diffuse but stable level of abdominal pain.  Drinking liquids with difficulty.  Very weak.       Objective: Filed Vitals:   03/01/12 2054 03/01/12 2100 03/02/12 0612 03/02/12 0830  BP: 152/65  139/76   Pulse: 76  77   Temp: 98.4 F (36.9 C)  97.4 F (36.3 C)   TempSrc: Oral  Oral   Resp: 18  20   Height:      Weight:      SpO2: 100% 100% 98% 99%    Intake/Output Summary (Last 24 hours) at 03/02/12 1200 Last data filed at 03/02/12 1004  Gross per 24 hour  Intake    985 ml  Output    155 ml  Net    830 ml   Filed Weights   02/29/12 1900 03/01/12 0110  Weight: 65.1 kg (143 lb 8.3 oz) 64 kg (141 lb 1.5 oz)    Exam:   General:  CF, No acute distress  HEENT:  NCAT, MMM  Cardiovascular:   RRR, nl S1, S2 no mrg, 2+ pulses, warm extremities  Respiratory:  CTAB, no increased WOB  Abdomen:  Absent bowel sounds today, tender in the RUQ, epigastric  area, LUQ, and LLQ with some voluntary guarding, no rebound  MSK:   Normal tone and bulk, no LEE  Neuro:  Grossly intact  Skin:  Less skin tenting.  Data Reviewed: Basic Metabolic Panel:  Recent Labs Lab 02/29/12 1535 03/01/12 0536 03/02/12 0542  NA 136 139 142  K 4.0 3.8 3.9  CL 97 101 105  CO2 29 31 29   GLUCOSE 133* 130* 95  BUN 46* 33* 21  CREATININE 1.01 1.04 0.96  CALCIUM 9.7 8.8 8.8   Liver Function Tests:  Recent Labs Lab 02/29/12 1535 03/01/12 0536 03/02/12 0542  AST 16 9 12   ALT 12 9 8   ALKPHOS 120* 104 91  BILITOT 0.3 0.2* 0.3  PROT 7.4 6.0 6.2  ALBUMIN 2.7* 2.2* 2.3*   No results found for this basename: LIPASE, AMYLASE,  in the last 168 hours No results found for this basename: AMMONIA,  in the last 168  hours CBC:  Recent Labs Lab 02/29/12 1535 03/01/12 0536 03/02/12 0542  WBC 11.8* 12.5* 11.4*  NEUTROABS 8.4*  --   --   HGB 11.1* 9.1* 9.6*  HCT 34.2* 28.5* 31.3*  MCV 86.4 87.4 87.4  PLT 446* 363 354   Cardiac Enzymes: No results found for this basename: CKTOTAL, CKMB, CKMBINDEX, TROPONINI,  in the last 168 hours BNP (last 3 results)  Recent Labs  08/02/11 1630 11/02/11 0623 11/03/11 0628  PROBNP 1420.0* 4539.0* 1925.0*   CBG: No results found for this basename: GLUCAP,  in the last 168 hours  Recent Results (from the past 240 hour(s))  URINE CULTURE     Status: None   Collection Time    02/29/12  3:51 PM      Result Value Range Status   Specimen Description URINE, CLEAN CATCH   Final   Special Requests NONE   Final   Culture  Setup Time 03/01/2012 01:39   Final   Colony Count >=100,000 COLONIES/ML   Final   Culture GRAM NEGATIVE RODS   Final   Report Status PENDING   Incomplete     Studies: Ct Abdomen Pelvis W Contrast  02/29/2012  *RADIOLOGY REPORT*  Clinical Data: Left lower quadrant pain, abdominal pain  CT ABDOMEN AND PELVIS WITH CONTRAST  Technique:  Multidetector CT imaging of the abdomen and pelvis was performed following the standard protocol during bolus administration of intravenous contrast.  Contrast: 25mL OMNIPAQUE IOHEXOL 300 MG/ML  SOLN, 80mL OMNIPAQUE IOHEXOL 300 MG/ML  SOLN  Comparison: CT 10/31/2011  Findings: Limited view of the lung bases are unchanged.  There is elevation of the right hemidiaphragm.  No focal hepatic lesion.  Entire liver is not imaged.  A portion of the gallbladder is imaged which appears normal.  The pancreas, spleen, adrenal glands, and kidneys are unchanged. Nonobstructing calculus in the lower pole of the left kidney.  There is a mixed density cysts in the left and right kidney which are not changed in size compared to prior.  The stomach, small bowel, and cecum are normal.  Moderate volume stool throughout the colon.  There is  an exuberant inflammatory response associated with the proximal sigmoid colon which occurs over approximately 10 cm at the site of prior inflammation.  There is some fat within the wall of the colon suggesting chronic infection/inflammation.  The bowel wall is thickened and edematous measuring 11 mm in thickness.  There is no evidence of frank abscess or intraperitoneal free air.  There is stool in the rectum.  Abdominal  aorta normal caliber.  Post hysterectomy anatomy.  No pelvic lymphadenopathy.  Review of  bone windows demonstrates no aggressive osseous lesions.  IMPRESSION:  1.  Exuberant inflammatory process involving the sigmoid colon appears chronic.  This inflammation is present on comparison exam of 10/31/2011 but is worse now.  Differential includes chronic diverticulitis or chronic segmental colitis with differential including inflammatory bowel disease.  No evidence of macroperforation or abscess.  2.  The liver and gallbladder are partially imaged due to elevation of the right hemidiaphragm.   Original Report Authenticated By: Genevive Bi, M.D.    Dg Abd Portable 2v  03/02/2012  *RADIOLOGY REPORT*  Clinical Data: Abdominal pain and distention  PORTABLE ABDOMEN - 2 VIEW  Comparison: 02/29/2012  Findings: Enteric contrast material was identified within the ascending colon and proximal transverse colon.  There are no dilated loops of small bowel or fluid levels identified.  Scoliosis deformity is noted within the lumbar spine.  There is multilevel degenerative disc disease within the lumbar spine. Previous right hip arthroplasty.  IMPRESSION:  1.  Nonobstructive bowel gas pattern. 2.  Enteric contrast material is present within the colon.   Original Report Authenticated By: Signa Kell, M.D.     Scheduled Meds: . budesonide-formoterol  1 puff Inhalation BID  . ciprofloxacin  400 mg Intravenous Q12H  . heparin  5,000 Units Subcutaneous Q8H  . metronidazole  500 mg Intravenous Q8H  .  ondansetron (ZOFRAN) IV  4 mg Intravenous Q6H  . sodium chloride  3 mL Intravenous Q12H  . tiotropium  18 mcg Inhalation Daily   Continuous Infusions: . dextrose 5 % and 0.45% NaCl 75 mL/hr at 03/01/12 1242    Principal Problem:   Diverticulitis Active Problems:   HYPERTENSION   UTI (lower urinary tract infection)   Chronic respiratory failure with hypoxia   Diastolic CHF, chronic   Pancreatic mass   Anemia   Wound of left leg   Leukocytosis, unspecified   Nonspecific (abnormal) findings on radiological and other examination of gastrointestinal tract    Time spent: 30 min    Julien Oscar, [facility]  [facility] Pager [phone]. If 7PM-7AM, please contact night-coverage at [email or web address], password [facility] 03/02/2012, 12:00 PM  LOS: 2 days

## 2012-03-03 LAB — COMPREHENSIVE METABOLIC PANEL
ALT: 7 U/L (ref 0–35)
AST: 11 U/L (ref 0–37)
Albumin: 2.1 g/dL — ABNORMAL LOW (ref 3.5–5.2)
Calcium: 8.5 mg/dL (ref 8.4–10.5)
Sodium: 137 mEq/L (ref 135–145)
Total Protein: 5.4 g/dL — ABNORMAL LOW (ref 6.0–8.3)

## 2012-03-03 LAB — CBC
HCT: 28.7 % — ABNORMAL LOW (ref 36.0–46.0)
Hemoglobin: 8.9 g/dL — ABNORMAL LOW (ref 12.0–15.0)
MCH: 27.1 pg (ref 26.0–34.0)
MCHC: 31 g/dL (ref 30.0–36.0)

## 2012-03-03 MED ORDER — PROMETHAZINE HCL 25 MG/ML IJ SOLN
12.5000 mg | Freq: Four times a day (QID) | INTRAMUSCULAR | Status: DC | PRN
  Administered 2012-03-06: 12.5 mg via INTRAVENOUS
  Filled 2012-03-03: qty 1

## 2012-03-03 MED ORDER — DEXTROSE 5 % IV SOLN
1.0000 g | INTRAVENOUS | Status: DC
  Administered 2012-03-03 – 2012-03-05 (×3): 1 g via INTRAVENOUS
  Filled 2012-03-03 (×4): qty 10

## 2012-03-03 NOTE — Progress Notes (Signed)
[facility] Progress Note  Subjective:  Still with nausea and just overall weakness, but says that pain is a little better.  Looks better to me clinically.  Objective:  Vital signs in last 24 hours: Temp:  [97.9 F (36.6 C)-98 F (36.7 C)] 97.9 F (36.6 C) (03/02 0528) Pulse Rate:  [70-82] 82 (03/02 0528) Resp:  [20-22] 22 (03/02 0528) BP: (147-164)/(68-83) 149/69 mmHg (03/02 0626) SpO2:  [97 %-99 %] 97 % (03/02 0528) Weight:  [145 lb 4.5 oz (65.9 kg)] 145 lb 4.5 oz (65.9 kg) (03/02 0500) Last BM Date: 03/03/12 General:   Alert, Well-developed, in NAD Heart:  Regular rate and rhythm; 3/6 murmur noted Pulm:  CTAB.  Decreased air movement. Abdomen:  Soft, non-distended.  BS present.  RLQ TTP. Extremities:  Without edema.  Venous stasis changes noted. Neurologic:  Alert and  oriented x4;  grossly normal neurologically. Psych:  Alert and cooperative. Normal mood and affect.  Intake/Output from previous day: 03/01 0701 - 03/02 0700 In: 740 [P.O.:40; IV Piggyback:700] Out: 975 [Urine:975]  Lab Results:  Recent Labs  03/01/12 0536 03/02/12 0542 03/03/12 0505  WBC 12.5* 11.4* 15.2*  HGB 9.1* 9.6* 8.9*  HCT 28.5* 31.3* 28.7*  PLT 363 354 358   BMET  Recent Labs  03/01/12 0536 03/02/12 0542 03/03/12 0505  NA 139 142 137  K 3.8 3.9 3.6  CL 101 105 102  CO2 31 29 28   GLUCOSE 130* 95 162*  BUN 33* 21 14  CREATININE 1.04 0.96 0.87  CALCIUM 8.8 8.8 8.5   LFT  Recent Labs  03/03/12 0505  PROT 5.4*  ALBUMIN 2.1*  AST 11  ALT 7  ALKPHOS 79  BILITOT 0.2*   Dg Abd Portable 2v  03/02/2012  *RADIOLOGY REPORT*  Clinical Data: Abdominal pain and distention  PORTABLE ABDOMEN - 2 VIEW  Comparison: 02/29/2012  Findings: Enteric contrast material was identified within the ascending colon and proximal transverse colon.  There are no dilated loops of small bowel or fluid levels identified.  Scoliosis deformity is noted within the lumbar spine.  There is multilevel  degenerative disc disease within the lumbar spine. Previous right hip arthroplasty.  IMPRESSION:  1.  Nonobstructive bowel gas pattern. 2.  Enteric contrast material is present within the colon.   Original Report Authenticated By: Signa Kell, M.D.     Assessment / Plan: -Active inflammatory process in the LLQ; ? Acute on chronic diverticulitis vs colitis (some findings on CT date back to 2012).  Unchanged clinically on Cipro and Flagyl and WBC count trending up, however, patient looks better clinically.  *Continue IV abx (cipro and flagyl) *Repeat CT scan tomorrow 3/3 *May need BE for assessment once improved; too high risk for sedation, ie flex sig or colonoscopy *? Surgical consult. I have reviewed the above note, examined the patient and agree with plan of treatment.In better spirits today but ab domen still very tender in LLQ,  For CT scan tomorrow. Remain on clear liquids.  [facility] Pager # [phone]    LOS: 3 days   Doug Sou D.  03/03/2012, 8:39 AM  Pager number [phone]

## 2012-03-03 NOTE — Progress Notes (Signed)
[facility] PROGRESS NOTE  Katie Galvan WUJ:[id number] DOB: 05/14/1927 DOA: 02/29/2012 PCP: Michele Mcalpine, MD  Assessment/Plan  Sigmoiditis, acute on chronic.  DDx includes infectious, recurrent bacterial infection, possible underlying malignancy or inflammatory disorder.  Persistent but mildly improved abdominal pain and nausea today.  WBC rising.  Question developing abscess.   -  Continue CLD - Continue Zofran and add phenergan prn - C. Diff neg - Stool culture not drawn - O&P not drawn - Occult positive -  Lactoferrin pending - Continue antibiotics (now ceftriaxone/flagyl) day 4 - Appreciate GI recommendations -  Repeat CT abd/pelvis tomorrow  UTI, E. coli intermediate resistance to cipro.  - Change cipro to ceftriaxone   HYPERTENSION/HLD, chronic diastolic/systolic CHF stable.  BP mildly elevated - hold oral medications for now  - Restart as tolerated  - Appears euvolemic - continue prn labetalol for SBP > 160  - Daily weights stable and strict I/O  Chronic respiratory failure with hypoxia due to COPD, restrictive lung disease, stable.  - Continue 2L [place]  - Continue symbicort and spiriva with albuterol prn   Pancreatic mass, stable and being followed by GI.   Bilateral lower extremity ulcers, appear to be healing well  - Continue dressing changes daily   Leukocytosis, unspecified, likely related to UTI and sigmoiditis and rising today - Trend, tx as above   Iron deficiency anemia,  hgb stable.   - Tx for hemoglobin < 7  -  Iron studies sent by GI:  Ferritin elevated likely due to inflammation, but iron level low. -  occult stool positive  Dehydration resolved - Continue MIVF - Monitor carefully for hypervolemia   Diet: Clear liquid  Access: PIV  IVF: D5 1/2 NS at 68ml/h  Proph: heparin   Code Status: DNR  Family Communication: spoke with patient alone today Disposition Plan: likely to SNF pending medical improvement.  PT/OT recs in  chart.  Consultants:  GI  Procedures:  CT abd/pelvis  Antibiotics:  cipro 2/27 >> 3/2  Flagyl 2/27 >>   Ceftriaxone 3/2 >>  HPI/Subjective:  Denies fevers, chills, headache, chest pain and shortness of breath.  Had nausea with retching again this morning.  Persistent LLQ abdominal pain.  Drinking liquids with difficulty.  Very weak.       Objective: Filed Vitals:   03/03/12 0528 03/03/12 0626 03/03/12 1112 03/03/12 1435  BP:  149/69  149/88  Pulse: 82   76  Temp: 97.9 F (36.6 C)   98.3 F (36.8 C)  TempSrc: Oral   Oral  Resp: 22   20  Height:      Weight:      SpO2: 97%  98% 98%    Intake/Output Summary (Last 24 hours) at 03/03/12 1507 Last data filed at 03/03/12 1340  Gross per 24 hour  Intake    540 ml  Output   1275 ml  Net   -735 ml   Filed Weights   02/29/12 1900 03/01/12 0110 03/03/12 0500  Weight: 65.1 kg (143 lb 8.3 oz) 64 kg (141 lb 1.5 oz) 65.9 kg (145 lb 4.5 oz)    Exam:   General:  CF, No acute distress  HEENT:  NCAT, MMM  Cardiovascular:   RRR, nl S1, S2 no mrg, 2+ pulses, warm extremities  Respiratory:  CTAB, no increased WOB  Abdomen:  Rare bowel sounds today, tender in the LLQ with some voluntary guarding, no rebound  MSK:   Normal tone and bulk, no LEE  Neuro:  Grossly intact  Skin:  No skin tenting.  Data Reviewed: Basic Metabolic Panel:  Recent Labs Lab 02/29/12 1535 03/01/12 0536 03/02/12 0542 03/03/12 0505  NA 136 139 142 137  K 4.0 3.8 3.9 3.6  CL 97 101 105 102  CO2 29 31 29 28   GLUCOSE 133* 130* 95 162*  BUN 46* 33* 21 14  CREATININE 1.01 1.04 0.96 0.87  CALCIUM 9.7 8.8 8.8 8.5   Liver Function Tests:  Recent Labs Lab 02/29/12 1535 03/01/12 0536 03/02/12 0542 03/03/12 0505  AST 16 9 12 11   ALT 12 9 8 7   ALKPHOS 120* 104 91 79  BILITOT 0.3 0.2* 0.3 0.2*  PROT 7.4 6.0 6.2 5.4*  ALBUMIN 2.7* 2.2* 2.3* 2.1*   No results found for this basename: LIPASE, AMYLASE,  in the last 168 hours No results  found for this basename: AMMONIA,  in the last 168 hours CBC:  Recent Labs Lab 02/29/12 1535 03/01/12 0536 03/02/12 0542 03/03/12 0505  WBC 11.8* 12.5* 11.4* 15.2*  NEUTROABS 8.4*  --   --   --   HGB 11.1* 9.1* 9.6* 8.9*  HCT 34.2* 28.5* 31.3* 28.7*  MCV 86.4 87.4 87.4 87.2  PLT 446* 363 354 358   Cardiac Enzymes: No results found for this basename: CKTOTAL, CKMB, CKMBINDEX, TROPONINI,  in the last 168 hours BNP (last 3 results)  Recent Labs  08/02/11 1630 11/02/11 0623 11/03/11 0628  PROBNP 1420.0* 4539.0* 1925.0*   CBG: No results found for this basename: GLUCAP,  in the last 168 hours  Recent Results (from the past 240 hour(s))  URINE CULTURE     Status: None   Collection Time    02/29/12  3:51 PM      Result Value Range Status   Specimen Description URINE, CLEAN CATCH   Final   Special Requests NONE   Final   Culture  Setup Time 03/01/2012 01:39   Final   Colony Count >=100,000 COLONIES/ML   Final   Culture ESCHERICHIA COLI   Final   Report Status 03/02/2012 FINAL   Final   Organism ID, Bacteria ESCHERICHIA COLI   Final  CLOSTRIDIUM DIFFICILE BY PCR     Status: None   Collection Time    03/03/12  1:27 AM      Result Value Range Status   C difficile by pcr NEGATIVE  NEGATIVE Final     Studies: Dg Abd Portable 2v  03/02/2012  *RADIOLOGY REPORT*  Clinical Data: Abdominal pain and distention  PORTABLE ABDOMEN - 2 VIEW  Comparison: 02/29/2012  Findings: Enteric contrast material was identified within the ascending colon and proximal transverse colon.  There are no dilated loops of small bowel or fluid levels identified.  Scoliosis deformity is noted within the lumbar spine.  There is multilevel degenerative disc disease within the lumbar spine. Previous right hip arthroplasty.  IMPRESSION:  1.  Nonobstructive bowel gas pattern. 2.  Enteric contrast material is present within the colon.   Original Report Authenticated By: Signa Kell, M.D.     Scheduled Meds: .  budesonide-formoterol  1 puff Inhalation BID  . cefTRIAXone (ROCEPHIN)  IV  1 g Intravenous Q24H  . heparin  5,000 Units Subcutaneous Q8H  . metronidazole  500 mg Intravenous Q8H  . ondansetron (ZOFRAN) IV  4 mg Intravenous Q6H  . sodium chloride  3 mL Intravenous Q12H  . tiotropium  18 mcg Inhalation Daily   Continuous Infusions:    Principal Problem:   Diverticulitis  Active Problems:   HYPERTENSION   UTI (lower urinary tract infection)   Chronic respiratory failure with hypoxia   Diastolic CHF, chronic   Pancreatic mass   Anemia   Wound of left leg   Leukocytosis, unspecified   Nonspecific (abnormal) findings on radiological and other examination of gastrointestinal tract    Time spent: 30 min    SHORT, [facility]  [facility] Pager [phone]. If 7PM-7AM, please contact night-coverage at [email or web address], password [facility] 03/03/2012, 3:07 PM  LOS: 3 days

## 2012-03-03 NOTE — Progress Notes (Signed)
  Pharmacy Note (Brief) - Abx Change  2/27 Urine culture now growing >100K E.coli, only intermediately sensitive to cipro, but sensitive to ceftriaxone. Pt received 1 dose of ceftriaxone on 2/27 (looks like it was given after urine sample was collected). Discussed with Dr. Malachi Bonds, who gave verbal order to switch Cipro to Ceftriaxone.  Plan: 1) D/C Cipro 2) Start ceftriaxone 1g IV q24h 3) Continue flagyl per MD. 4) Pharmacy to sign off (no dose adjustment needed for ceftriaxone).  Darrol Angel, PharmD Pager: [phone] 03/03/2012 9:56 AM

## 2012-03-04 ENCOUNTER — Inpatient Hospital Stay (HOSPITAL_COMMUNITY): Payer: Medicare Other

## 2012-03-04 DIAGNOSIS — K5732 Diverticulitis of large intestine without perforation or abscess without bleeding: Secondary | ICD-10-CM

## 2012-03-04 LAB — COMPREHENSIVE METABOLIC PANEL
Alkaline Phosphatase: 78 U/L (ref 39–117)
BUN: 12 mg/dL (ref 6–23)
GFR calc Af Amer: 64 mL/min — ABNORMAL LOW (ref >90)
Glucose, Bld: 92 mg/dL (ref 70–99)
Potassium: 4.1 mEq/L (ref 3.5–5.1)
Total Protein: 5.3 g/dL — ABNORMAL LOW (ref 6.0–8.3)

## 2012-03-04 LAB — CBC
HCT: 29.8 % — ABNORMAL LOW (ref 36.0–46.0)
Platelets: 328 10*3/uL (ref 150–400)
RDW: 15.6 % — ABNORMAL HIGH (ref 11.5–15.5)
WBC: 15.5 10*3/uL — ABNORMAL HIGH (ref 4.0–10.5)

## 2012-03-04 MED ORDER — IOHEXOL 300 MG/ML  SOLN
25.0000 mL | INTRAMUSCULAR | Status: AC
  Administered 2012-03-04 (×2): 25 mL via ORAL

## 2012-03-04 MED ORDER — ONDANSETRON 8 MG/NS 50 ML IVPB
8.0000 mg | Freq: Four times a day (QID) | INTRAVENOUS | Status: DC
  Administered 2012-03-04 – 2012-03-26 (×86): 8 mg via INTRAVENOUS
  Filled 2012-03-04 (×90): qty 8

## 2012-03-04 MED ORDER — KCL IN DEXTROSE-NACL 20-5-0.45 MEQ/L-%-% IV SOLN
INTRAVENOUS | Status: DC
  Administered 2012-03-04: 13:00:00 via INTRAVENOUS
  Filled 2012-03-04 (×2): qty 1000

## 2012-03-04 MED ORDER — IOHEXOL 300 MG/ML  SOLN
80.0000 mL | Freq: Once | INTRAMUSCULAR | Status: AC | PRN
  Administered 2012-03-04: 80 mL via INTRAVENOUS

## 2012-03-04 NOTE — Progress Notes (Addendum)
[facility] PROGRESS NOTE  Katie Galvan ZOX:[id number] DOB: [birth year] DOA: 02/29/2012 PCP: Michele Mcalpine, MD  Assessment/Plan  Sigmoiditis, acute on chronic.  DDx includes infectious, recurrent bacterial infection, possible underlying malignancy or inflammatory disorder.  No improvement on antibiotics and worsening LLQ pain. -  NPO with IVF - Increase scheduled Zofran -  Continue phenergan prn - C. Diff neg - Stool culture not drawn - O&P not drawn - Occult positive -  Lactoferrin pending - Continue antibiotics (now ceftriaxone/flagyl) day 5 - Appreciate GI recommendations - Await surgical evaluation -  Repeat CT abd/pelvis pending  UTI, E. coli intermediate resistance to cipro.  - Continue ceftriaxone day 2   HYPERTENSION/HLD, chronic diastolic/systolic CHF stable.  BP mildly elevated.  Weight is up 2kg despite lack of oral intake and IVF for the last day.   - hold oral medications for now  - Restart as tolerated  - Not clinically hypervolemic - continue prn labetalol for SBP > 160  - Daily weights stable and strict I/O  Chronic respiratory failure with hypoxia due to COPD, restrictive lung disease, stable.  - Continue 2L [place]  - Continue symbicort and spiriva with albuterol prn   Pancreatic mass, stable and being followed by GI.    Bilateral lower extremity ulcers, appear to be healing well  - Continue dressing changes daily   Leukocytosis, unspecified, likely related to UTI and sigmoiditis.  Persistently elevated - Trend, tx as above   Iron deficiency anemia,  hgb stable.   - Tx for hemoglobin < 7  -  Iron studies sent by GI:  Ferritin elevated likely due to inflammation, but iron level low. -  occult stool positive  Diet: NPO  Access: PIV  IVF: D5 1/2 NS at 50 ml/h  Proph: heparin   Code Status: DNR  Family Communication: spoke with patient alone today Disposition Plan:  Not medically stable.  likely to SNF pending medical improvement.  PT/OT recs in  chart.  Consultants:  GI, Dr. Arlyce Dice  General surgery  Procedures:  CT abd/pelvis  Antibiotics:  cipro 2/27 >> 3/2  Flagyl 2/27 >>   Ceftriaxone 3/2 >>  HPI/Subjective:  Denies fevers, chills, headache, chest pain and shortness of breath.  Had nausea with retching.  Increased LLQ abdominal pain.  Drinking liquids with difficulty.  Very weak.       Objective: Filed Vitals:   03/03/12 2006 03/03/12 2122 03/04/12 0448 03/04/12 0500  BP:  147/76 153/93   Pulse:  83 70   Temp:  97.6 F (36.4 C) 98.2 F (36.8 C)   TempSrc:  Oral Oral   Resp:  20 20   Height:      Weight:    66 kg (145 lb 8.1 oz)  SpO2: 97% 96% 99%     Intake/Output Summary (Last 24 hours) at 03/04/12 1226 Last data filed at 03/04/12 0859  Gross per 24 hour  Intake    320 ml  Output    675 ml  Net   -355 ml   Filed Weights   03/01/12 0110 03/03/12 0500 03/04/12 0500  Weight: 64 kg (141 lb 1.5 oz) 65.9 kg (145 lb 4.5 oz) 66 kg (145 lb 8.1 oz)    Exam:   General:  CF, Retching  HEENT:  NCAT, MMM  Cardiovascular:   RRR, nl S1, S2 no mrg, 2+ pulses, warm extremities  Respiratory:  CTAB, no increased WOB  Abdomen:  Rare bowel sounds, tender in the LLQ with increased voluntary guarding,  no rebound  MSK:   Normal tone and bulk, no LEE  Neuro:  Grossly intact  Skin:  No skin tenting.  Data Reviewed: Basic Metabolic Panel:  Recent Labs Lab 02/29/12 1535 03/01/12 0536 03/02/12 0542 03/03/12 0505 03/04/12 0435  NA 136 139 142 137 138  K 4.0 3.8 3.9 3.6 4.1  CL 97 101 105 102 103  CO2 29 31 29 28 28   GLUCOSE 133* 130* 95 162* 92  BUN 46* 33* 21 14 12   CREATININE 1.01 1.04 0.96 0.87 0.94  CALCIUM 9.7 8.8 8.8 8.5 8.4   Liver Function Tests:  Recent Labs Lab 02/29/12 1535 03/01/12 0536 03/02/12 0542 03/03/12 0505 03/04/12 0435  AST 16 9 12 11 24   ALT 12 9 8 7 11   ALKPHOS 120* 104 91 79 78  BILITOT 0.3 0.2* 0.3 0.2* 0.3  PROT 7.4 6.0 6.2 5.4* 5.3*  ALBUMIN 2.7* 2.2* 2.3*  2.1* 2.2*   No results found for this basename: LIPASE, AMYLASE,  in the last 168 hours No results found for this basename: AMMONIA,  in the last 168 hours CBC:  Recent Labs Lab 02/29/12 1535 03/01/12 0536 03/02/12 0542 03/03/12 0505 03/04/12 0435  WBC 11.8* 12.5* 11.4* 15.2* 15.5*  NEUTROABS 8.4*  --   --   --   --   HGB 11.1* 9.1* 9.6* 8.9* 9.3*  HCT 34.2* 28.5* 31.3* 28.7* 29.8*  MCV 86.4 87.4 87.4 87.2 87.4  PLT 446* 363 354 358 328   Cardiac Enzymes: No results found for this basename: CKTOTAL, CKMB, CKMBINDEX, TROPONINI,  in the last 168 hours BNP (last 3 results)  Recent Labs  08/02/11 1630 11/02/11 0623 11/03/11 0628  PROBNP 1420.0* 4539.0* 1925.0*   CBG: No results found for this basename: GLUCAP,  in the last 168 hours  Recent Results (from the past 240 hour(s))  URINE CULTURE     Status: None   Collection Time    02/29/12  3:51 PM      Result Value Range Status   Specimen Description URINE, CLEAN CATCH   Final   Special Requests NONE   Final   Culture  Setup Time 03/01/2012 01:39   Final   Colony Count >=100,000 COLONIES/ML   Final   Culture ESCHERICHIA COLI   Final   Report Status 03/02/2012 FINAL   Final   Organism ID, Bacteria ESCHERICHIA COLI   Final  CLOSTRIDIUM DIFFICILE BY PCR     Status: None   Collection Time    03/03/12  1:27 AM      Result Value Range Status   C difficile by pcr NEGATIVE  NEGATIVE Final     Studies: No results found.  Scheduled Meds: . budesonide-formoterol  1 puff Inhalation BID  . cefTRIAXone (ROCEPHIN)  IV  1 g Intravenous Q24H  . heparin  5,000 Units Subcutaneous Q8H  . metronidazole  500 mg Intravenous Q8H  . ondansetron (ZOFRAN) IV  4 mg Intravenous Q6H  . sodium chloride  3 mL Intravenous Q12H  . tiotropium  18 mcg Inhalation Daily   Continuous Infusions:    Principal Problem:   Diverticulitis Active Problems:   HYPERTENSION   UTI (lower urinary tract infection)   Chronic respiratory failure with  hypoxia   Diastolic CHF, chronic   Pancreatic mass   Anemia   Wound of left leg   Leukocytosis, unspecified   Nonspecific (abnormal) findings on radiological and other examination of gastrointestinal tract    Time spent: 30 min  Renae Fickle  [facility] Pager [phone]. If 7PM-7AM, please contact night-coverage at [email or web address], password [facility] 03/04/2012, 12:26 PM  LOS: 4 days

## 2012-03-04 NOTE — Progress Notes (Signed)
[facility] Progress Note  SUBJECTIVE: doesn't feel well. She is still having LLQ pain. Tolerated clear liquids last night, has taken any today. Having nausea and dry heaves  OBJECTIVE:  Vital signs in last 24 hours: Temp:  [97.6 F (36.4 C)-98.3 F (36.8 C)] 98.2 F (36.8 C) (03/03 0448) Pulse Rate:  [70-83] 70 (03/03 0448) Resp:  [20] 20 (03/03 0448) BP: (147-153)/(76-93) 153/93 mmHg (03/03 0448) SpO2:  [96 %-99 %] 99 % (03/03 0448) Weight:  [145 lb 8.1 oz (66 kg)] 145 lb 8.1 oz (66 kg) (03/03 0500) Last BM Date: 03/03/12 General:    white female in NAD Heart:  Regular rate and rhythm Lungs: Respirations even and unlabored, lungs CTA bilaterally Abdomen:  Soft, nondistended, moderate LLQ tenderness. Normal bowel sounds. Neurologic:  Alert and oriented,  grossly normal neurologically. Psych:  Cooperative. Normal mood and affect.   Lab Results:  Recent Labs  03/02/12 0542 03/03/12 0505 03/04/12 0435  WBC 11.4* 15.2* 15.5*  HGB 9.6* 8.9* 9.3*  HCT 31.3* 28.7* 29.8*  PLT 354 358 328   BMET  Recent Labs  03/02/12 0542 03/03/12 0505 03/04/12 0435  NA 142 137 138  K 3.9 3.6 4.1  CL 105 102 103  CO2 29 28 28   GLUCOSE 95 162* 92  BUN 21 14 12   CREATININE 0.96 0.87 0.94  CALCIUM 8.8 8.5 8.4   LFT  Recent Labs  03/04/12 0435  PROT 5.3*  ALBUMIN 2.2*  AST 24  ALT 11  ALKPHOS 78  BILITOT 0.3   Studies/Results: Dg Abd Portable 2v  03/02/2012  *RADIOLOGY REPORT*  Clinical Data: Abdominal pain and distention  PORTABLE ABDOMEN - 2 VIEW  Comparison: 02/29/2012  Findings: Enteric contrast material was identified within the ascending colon and proximal transverse colon.  There are no dilated loops of small bowel or fluid levels identified.  Scoliosis deformity is noted within the lumbar spine.  There is multilevel degenerative disc disease within the lumbar spine. Previous right hip arthroplasty.  IMPRESSION:  1.  Nonobstructive bowel gas pattern. 2.   Enteric contrast material is present within the colon.   Original Report Authenticated By: Signa Kell, M.D.      ASSESSMENT / PLAN:  1. Lower abdominal pain, LLQ inflammatory process on recent CTScan which may be recurrent vs unresolved diverticulitis. No improvement on antibiotics, for repeat CTscan today. Recommend surgical consultation, will place consult. Make NPO for now.  2.  Multiple medical problems as listed in PMH.   3.  Uncinate lesion on CTscan a few months ago, felt to be benign by Dr. Christella Hartigan.     LOS: 4 days   Willette Cluster  03/04/2012, 9:00 AM  I have personally taken an interval history, reviewed the chart, and examined the patient.  I agree with the extender's note, impression and recommendations.  Barbette Hair. Arlyce Dice, MD, [facility] [phone]

## 2012-03-04 NOTE — Progress Notes (Signed)
Pt in bed resting w/o any s/s of distress will continue to monitor.

## 2012-03-04 NOTE — Progress Notes (Signed)
PT Cancellation Note  Patient Details Name: Katie Galvan MRN: [id number] DOB: 05/01/1927   Cancelled Treatment:    Reason Eval/Treat Not Completed: Pain limiting ability to participate  Pt reports not feeling well today and unable to participate in therapy.  Also reported lower abdominal pain and RN notified and states pt not due for pain meds yet.   LEMYRE,[name] 03/04/2012, 11:37 AM Pager: [phone]

## 2012-03-04 NOTE — Consult Note (Addendum)
Re:   Katie Galvan DOB:   1927/11/25 MRN:   [id number]  ASSESSMENT AND PLAN: 1.  Diverticulitis vs colitis of sigmoid colon  CT scan - 03/04/2012 - chronic sigmoid colon thickening over about 10 cm (similar to CT scan 03/01/2011 and may be a little worse than 10/31/2011)  On Rocephin and Flagyl  With chronicity of the inflamed sigmoid colon, she would probably be best served with surgery.  But I am not sure that she would tolerate this well.  Dr. Donell Beers is our surgeon of the week and I will discuss the case with her.  It would also be good to have Dr. Jodelle Green input.  I have not spoken with her son, who seems to be her main care taker.  2.  UTI  E. Coli 3.  Hypertension 4.  COPD - on home O2 x 2 years.  Elevated right diaphragm 5.  Pancreatic mass - uncinate process  Stable - apparent benign process. 6.  Bilateral lower extremity ulcers.  Ran into steps getting on the bus. 7.  Malnourishment - Albumin - 2.2 on 03/04/2012 8.  Anemia - Hgb - 9.3 - 03/04/2012 9.  History of CHF  Has murmur.  Has seen Dr. Gala Romney in the past 10.  Per Epic record, she has a history of multiple recent falls and declining memory. 11.  DVT prophylaxis - SQ Heparin  Chief Complaint  Patient presents with  . Abdominal Pain   REFERRING PHYSICIAN: Dr. Darrel Hoover  HISTORY OF PRESENT ILLNESS: Katie Galvan is a 79 y.o. (DOB: 1927-06-13)  white  female whose primary care physician is NADEL,SCOTT M, MD and is in [facility] for sigmoid colitis/diverticulitis.  She has been a resident of [facility].  She was in the hospital in Nov 2013 with diverticulitis.  She appeared to be getting better.  But was admitted to [facility] on 02/29/2012 with abdominal pain, N/V, and fatigue.  She was diagnosed with sigmoid colitis, acute on chronic.  She was seen by Dr. Juanda Chance on admission. Because of persistent symptoms, general surgery was consulted.    Her past GI history is a appendectomy in 4th grade.  She had a colonoscopy, but a very  long time ago.  She had prior admissions last year for colitis/diverticulitis.  Note there have been 36 telephone entries in Epic in Jan/Feb 2014.   Past Medical History  Diagnosis Date  . Disorders of diaphragm     right diaphragm elevation  . Unspecified essential hypertension   . Endocarditis, valve unspecified, unspecified cause   . Cardiac dysrhythmia, unspecified   . Unspecified venous (peripheral) insufficiency   . Edema   . Pure hypercholesterolemia   . Other dysphagia   . Diverticulosis of colon (without mention of hemorrhage)   . Benign neoplasm of colon   . Osteoarthrosis, unspecified whether generalized or localized, unspecified site   . Lumbago   . Chronic pain syndrome   . Disorder of bone and cartilage, unspecified   . Depressive disorder, not elsewhere classified   . Anemia, unspecified   . Skin cancer   . Systolic heart failure     echo 10/10/10 EF 30%  . Hiatal hernia   . Gastritis   . COPD (chronic obstructive pulmonary disease)   . Lower extremity ulceration     dressing changes daily      Past Surgical History  Procedure Laterality Date  . Appendectomy    . Breast surgery      biopsy  . Left  wrist surgery    . Bilateral cataracts    . Hip surgery      dr. Rayburn Ma, twice because the bones were spongy  . Tonsillectomy    . Adenoidectomy        Current Facility-Administered Medications  Medication Dose Route Frequency Provider Last Rate Last Dose  . albuterol (PROVENTIL) (5 MG/ML) 0.5% nebulizer solution 2.5 mg  2.5 mg Nebulization Q2H PRN Renae Fickle, MD      . budesonide-formoterol (SYMBICORT) 160-4.5 MCG/ACT inhaler 1 puff  1 puff Inhalation BID Renae Fickle, MD   1 puff at 03/04/12 2006  . cefTRIAXone (ROCEPHIN) 1 g in dextrose 5 % 50 mL IVPB  1 g Intravenous Q24H Renae Fickle, MD   1 g at 03/04/12 1110  . dextrose 5 % and 0.45 % NaCl with KCl 20 mEq/L infusion   Intravenous Continuous Renae Fickle, MD 50 mL/hr at 03/04/12 1245     . heparin injection 5,000 Units  5,000 Units Subcutaneous Q8H Renae Fickle, MD   5,000 Units at 03/04/12 2032  . labetalol (NORMODYNE,TRANDATE) injection 10 mg  10 mg Intravenous Q2H PRN Renae Fickle, MD      . metroNIDAZOLE (FLAGYL) IVPB 500 mg  500 mg Intravenous Q8H Renae Fickle, MD   500 mg at 03/04/12 2032  . morphine 2 MG/ML injection 2-4 mg  2-4 mg Intravenous Q4H PRN Renae Fickle, MD   2 mg at 03/04/12 2032  . ondansetron (ZOFRAN) 8 mg/NS 50 ml IVPB  8 mg Intravenous Q6H Renae Fickle, MD   8 mg at 03/04/12 1738  . promethazine (PHENERGAN) injection 12.5 mg  12.5 mg Intravenous Q6H PRN Renae Fickle, MD      . sodium chloride 0.9 % injection 3 mL  3 mL Intravenous Q12H Renae Fickle, MD   3 mL at 03/03/12 0900  . tiotropium (SPIRIVA) inhalation capsule 18 mcg  18 mcg Inhalation Daily Renae Fickle, MD   18 mcg at 03/03/12 1110      Allergies  Allergen Reactions  . Sulfonamide Derivatives Swelling    REVIEW OF SYSTEMS: Skin:  No history of rash.  No history of abnormal moles. Infection:  No history of hepatitis or HIV.  No history of MRSA. Neurologic:  No history of stroke.  No history of seizure.  No history of headaches. Cardiac:  History of CHF.  Seen by Dr. Jones Broom.  EF of 65% on echo 08/03/2011.  Syncopal episodes in fall 2013 - no clear diagnosis. Pulmonary:  COPD.  On home O2 x 2 years.  Followed by Dr. Lennon Alstrom.  Endocrine:  No diabetes. No thyroid disease. Gastrointestinal:  No history of stomach disease.  No history of liver disease.  No history of gall bladder disease.  See HPI.  She was noted to have a pancreatic mass which has been stable. Urologic:  No history of kidney stones.  No history of bladder infections. Musculoskeletal:  She ahd been on chronic pain meds (MS Contin) for shoulder and back (Feb. 2013) -  But appears off of this now.  She had a right hip by Dr. Magnus Ivan around 2011. Hematologic:  No bleeding disorder.  No history of anemia.   Not anticoagulated. Psycho-social:  The patient is oriented.   But has a poor memory for her medical history.  SOCIAL and FAMILY HISTORY: Widowed about 2 years ago. Son, Casimiro Needle, lives in [place]. Daughter, Noreene Larsson, lives in [place].  PHYSICAL EXAM: BP 149/64  Pulse 79  Temp(Src) 98.1 F (36.7 C) (Oral)  Resp 20  Ht 5' (1.524 m)  Wt 145 lb 8.1 oz (66 kg)  BMI 28.42 kg/m2  SpO2 97%  General: Older WF who is alert. HEENT: Normal. Pupils equal. Neck: Supple. No mass.  No thyroid mass. Lymph Nodes:  No supraclavicular or cervical nodes. Lungs: Clear to auscultation and symmetric breath sounds. Heart:  RRR. 3/6 systolic murmur.  Abdomen: Soft.  Normal bowel sounds.  Tender mass LLQ (I think this is the sigmoid colon seen on CT).  No peritoneal signs.  Right mid abdominal scar from childhood appendectomy Rectal: Not done. Extremities:  Deformity left wrist (from remote fx).  Bandaids over lower extremities, pretibial area. Neurologic:  Grossly intact to motor and sensory function. Psychiatric: Has normal mood and affect.   DATA REVIEWED: Epic notes and labs.  Ovidio Kin, MD,  [facility] [address] [place]  [address]   [place], [place]    [address] Phone:  [phone] FAX:  [phone]

## 2012-03-04 NOTE — Progress Notes (Signed)
CSW continues to follow for return to [facility].  Bethany C. Corcoran MSW, LCSW [phone]  

## 2012-03-04 NOTE — Progress Notes (Signed)
Occupational Therapy Treatment Patient Details Name: Katie Galvan MRN: [id number] DOB: 1927/02/09 Today's Date: 03/04/2012 Time: 7829-5621 OT Time Calculation (min): 16 min  OT Assessment / Plan / Recommendation Comments on Treatment Session Pt not feeling well today secondary to abdominal pain.  Only wanted to attempt toilet transfer at bedside and simple grooming tasks at bedside.  Overall min assist level for toileting task this session.     Follow Up Recommendations  SNF       Equipment Recommendations  None recommended by OT       Frequency Min 2X/week   Plan Discharge plan remains appropriate    Precautions / Restrictions Precautions Precautions: Fall Precaution Comments: O2 dependent Restrictions Weight Bearing Restrictions: No   Pertinent Vitals/Pain O2 sats 90% on room air with activity, abdominal pain 4/10 on the faces scale    ADL  Grooming: Performed;Wash/dry hands;Set up Where Assessed - Grooming: Unsupported sitting Toilet Transfer: Performed;Minimal assistance Toilet Transfer Method: Stand pivot Toilet Transfer Equipment: Bedside commode Toileting - Clothing Manipulation and Hygiene: Performed;Minimal assistance Where Assessed - Engineer, mining and Hygiene: Sit to stand from 3-in-1 or toilet Transfers/Ambulation Related to ADLs: Pt able to peform stand pivot transfer from bed to bedside toilet with min assist. ADL Comments: Pt not feeling well this pm.  Only wanted to transfer to the bedside commode, would not agree to any further activity.        OT Goals ADL Goals Pt Will Transfer to Toilet: with supervision;with DME;3-in-1 ADL Goal: Toilet Transfer - Progress: Updated due to goal met Pt Will Perform Toileting - Hygiene: with supervision;Sit to stand from 3-in-1/toilet ADL Goal: Toileting - Hygiene - Progress: Updated due to goal met  Visit Information  Last OT Received On: 03/04/12 Assistance Needed: +1    Subjective Data  Subjective: I need to go to the bathroom Patient Stated Goal: Pt requested to go to the bathroom.      Cognition  Cognition Overall Cognitive Status: Appears within functional limits for tasks assessed/performed Arousal/Alertness: Awake/alert Orientation Level: Appears intact for tasks assessed Behavior During Session: [facility] for tasks performed    Mobility  Bed Mobility Bed Mobility: Supine to Sit Supine to Sit: 3: Mod assist;HOB elevated;With rails Sit to Supine: 4: Min assist Details for Bed Mobility Assistance: Pt needed assistance for sidelying to sit (mod assist) with initial sit to stand. Transfers Transfers: Sit to Stand Sit to Stand: 4: Min assist;With upper extremity assist;From chair/3-in-1 Stand to Sit: To chair/3-in-1;With upper extremity assist;4: Min assist       Balance Balance Balance Assessed: Yes Dynamic Standing Balance Dynamic Standing - Balance Support: Right upper extremity supported;Left upper extremity supported Dynamic Standing - Level of Assistance: 4: Min assist   End of Session OT - End of Session Activity Tolerance: Patient limited by pain;Patient limited by fatigue Patient left: in bed;with bed alarm set;with call bell/phone within reach Nurse Communication: Mobility status;Patient requests pain meds     MCGUIRE,JAMES OTR/L Pager number [phone] 03/04/2012, 1:59 PM

## 2012-03-05 ENCOUNTER — Inpatient Hospital Stay (HOSPITAL_COMMUNITY): Payer: Medicare Other

## 2012-03-05 ENCOUNTER — Telehealth: Payer: Self-pay | Admitting: Pulmonary Disease

## 2012-03-05 DIAGNOSIS — R0609 Other forms of dyspnea: Secondary | ICD-10-CM

## 2012-03-05 DIAGNOSIS — Z01810 Encounter for preprocedural cardiovascular examination: Secondary | ICD-10-CM

## 2012-03-05 LAB — COMPREHENSIVE METABOLIC PANEL
ALT: 14 U/L (ref 0–35)
AST: 25 U/L (ref 0–37)
Albumin: 2.3 g/dL — ABNORMAL LOW (ref 3.5–5.2)
Alkaline Phosphatase: 82 U/L (ref 39–117)
BUN: 10 mg/dL (ref 6–23)
Chloride: 100 mEq/L (ref 96–112)
Potassium: 4 mEq/L (ref 3.5–5.1)
Sodium: 134 mEq/L — ABNORMAL LOW (ref 135–145)
Total Bilirubin: 0.3 mg/dL (ref 0.3–1.2)

## 2012-03-05 LAB — CBC
MCH: 27 pg (ref 26.0–34.0)
MCHC: 31.4 g/dL (ref 30.0–36.0)
MCV: 85.9 fL (ref 78.0–100.0)
Platelets: 310 10*3/uL (ref 150–400)
RDW: 15.9 % — ABNORMAL HIGH (ref 11.5–15.5)

## 2012-03-05 LAB — FECAL LACTOFERRIN, QUANT: Fecal Lactoferrin: POSITIVE

## 2012-03-05 MED ORDER — LIDOCAINE HCL 1 % IJ SOLN
INTRAMUSCULAR | Status: AC
  Filled 2012-03-05: qty 20

## 2012-03-05 MED ORDER — MORPHINE SULFATE 4 MG/ML IJ SOLN
4.0000 mg | INTRAMUSCULAR | Status: DC | PRN
  Administered 2012-03-05 – 2012-03-14 (×30): 4 mg via INTRAVENOUS
  Filled 2012-03-05 (×31): qty 1

## 2012-03-05 MED ORDER — CLINIMIX E/DEXTROSE (5/20) 5 % IV SOLN
INTRAVENOUS | Status: AC
  Administered 2012-03-05: 18:00:00 via INTRAVENOUS
  Filled 2012-03-05: qty 1000

## 2012-03-05 MED ORDER — HYDROMORPHONE HCL PF 1 MG/ML IJ SOLN
0.5000 mg | Freq: Once | INTRAMUSCULAR | Status: AC
  Administered 2012-03-05: 0.5 mg via INTRAVENOUS
  Filled 2012-03-05: qty 1

## 2012-03-05 MED ORDER — SODIUM CHLORIDE 0.9 % IJ SOLN
10.0000 mL | INTRAMUSCULAR | Status: DC | PRN
  Administered 2012-03-06 – 2012-03-23 (×7): 10 mL

## 2012-03-05 MED ORDER — INSULIN ASPART 100 UNIT/ML ~~LOC~~ SOLN
0.0000 [IU] | SUBCUTANEOUS | Status: DC
  Administered 2012-03-05 – 2012-03-06 (×2): 1 [IU] via SUBCUTANEOUS
  Administered 2012-03-06: 2 [IU] via SUBCUTANEOUS
  Administered 2012-03-06 (×3): 1 [IU] via SUBCUTANEOUS
  Administered 2012-03-07: 2 [IU] via SUBCUTANEOUS
  Administered 2012-03-07 – 2012-03-08 (×6): 1 [IU] via SUBCUTANEOUS
  Administered 2012-03-08: 2 [IU] via SUBCUTANEOUS
  Administered 2012-03-08 – 2012-03-09 (×4): 1 [IU] via SUBCUTANEOUS
  Administered 2012-03-09: 2 [IU] via SUBCUTANEOUS
  Administered 2012-03-09 – 2012-03-10 (×7): 1 [IU] via SUBCUTANEOUS
  Administered 2012-03-10: 2 [IU] via SUBCUTANEOUS
  Administered 2012-03-11 – 2012-03-13 (×15): 1 [IU] via SUBCUTANEOUS
  Administered 2012-03-14: 7 [IU] via SUBCUTANEOUS
  Administered 2012-03-14 (×3): 1 [IU] via SUBCUTANEOUS
  Administered 2012-03-15: 5 [IU] via SUBCUTANEOUS
  Administered 2012-03-15: 3 [IU] via SUBCUTANEOUS

## 2012-03-05 NOTE — Consult Note (Signed)
CARDIOLOGY CONSULT NOTE  Patient ID: Katie Galvan, MRN: [id number], DOB/AGE: [id number] 77 y.o. Admit date: 02/29/2012 Date of Consult: 03/05/2012  Primary Physician: Katie Mcalpine, MD Primary Cardiologist: Dr. Hosie Galvan  Chief Complaint: abd pain Reason for Consultation: Preop clearance  HPI: 79 y.o. female w/ PMHx significant for Combined Diastolic & Systolic CHF (EF 69-62%), myopericarditis, endocarditis, HTN, HLD, PV insuf w/ chronic LE wounds/edema, anemia, restrictive lung dz from chronically elevated right hemidiaphragm, and COPD (chronic O2) who was admitted to [facility] on 02/29/2012 with acute on chronic sigmoid diverticulitis and UTI and is being considered for abdominal surgery. Cardiology is asked to provide perioperative risk assessment from a cardiac standpoint.   She has no known history of coronary disease. She was hospitalized in 10/2010 for colitis and felt to have myopericarditis that was treated with colchicine. Echo at that time showed EF 30%. Repeat echo in 12/2010 showed improved EF 55-60%. Last echo 08/2011 showed EF 65-70%, grade 1 diastolic dysfunction, mild MR, and mild LAE. She is followed by Dr. Gala Galvan at the advanced heart failure clinic and was last evaluated in clinic on 01/01/12. During this visit it was felt she was mildly volume overloaded for which her spironolactone was increased. In addition her Coreg was switched to Bisoprolol given COPD and ongoing dyspnea. Weight was 151lbs. She reports significant improvement in her LE edema and breathing since her last clinic visit. She has been able to ambulate the halls of her facility (~1/4 mile) a couple days a week without chest pain or significant sob (on supplemental O2). Denies orthopnea, PND, early satiety, or syncope.   She was hospitalized in august with a fall, then in late October with diverticulitis of the sigmoid colon at which time she was found to have a pancreatic mass. She has  been followed very closely by Drs. Katie Galvan and Katie Galvan, and her last GI visit was with Dr. Christella Galvan in December who recommended against endoscopy/colonoscopy at that time. He said that mass in pancreas was likely benign for several reasons, including normal CA19-9, but recommended follow-up with repeat imagining in 6 months. Over the last few months, she has had 2 or 3 UTIs and several falls. She has been in rehab for the last 5 weeks and has had a trip to the ER for fall with head trauma just a few weeks ago. She presented to [facility] on 2/27 with complaints of abdominal pain, nausea, and vomiting.   In the ED, CT abd/pelvis showed "exuberant inflammatory process involving the sigmoid colon appears chronic. This inflammation is present on comparison exam of 10/31/2011 but is worse now". Labs were significant for pBNP 8790 (previously 4539), Hgb 11.1-->8.8, (+) FOBT, WBC 11.8-->17.7. Weight was recorded as 143lbs. EKG showed NSR 69bpm, no acute ST/T changes. Repeat CTscan on 3/3 reveals persistent sigmoid wall thickening / pericolic inflammatory changes, question chronic diverticulitis vs inflammatory bowel disease,vs neoplasm. Focal gas collection adjacent to the diseased segment of sigmoid colon may be large sigmoid diverticula but a small contained perforation not complete. Her WBC has continued to rise despite antibiotic therapy. She has been evaluated by GI and surgery. She is being considered for surgical resection pending surgical risk assessment by Dr. Kriste Galvan and Cardiology. Weight is up 4lbs from admission. I/Os (+) . She is not sure if she wants surgery.     Past Medical History  Diagnosis Date  . Disorders of diaphragm     right diaphragm elevation  . Unspecified essential hypertension   .  Endocarditis, valve unspecified, unspecified cause   . Cardiac dysrhythmia, unspecified   . Unspecified venous (peripheral) insufficiency   . Edema   . Pure hypercholesterolemia   . Other dysphagia    . Diverticulosis of colon (without mention of hemorrhage)   . Benign neoplasm of colon   . Osteoarthrosis, unspecified whether generalized or localized, unspecified site   . Lumbago   . Chronic pain syndrome   . Disorder of bone and cartilage, unspecified   . Depressive disorder, not elsewhere classified   . Anemia, unspecified   . Skin cancer   . Systolic heart failure     echo 10/10/10 EF 30%  . Hiatal hernia   . Gastritis   . COPD (chronic obstructive pulmonary disease)   . Lower extremity ulceration     dressing changes daily     08/2011 - Echo Study Conclusions: - Left ventricle: The cavity size was normal. Wall thickness was increased in a pattern of mild LVH. Systolic function was vigorous. The estimated ejection fraction was in the range of 65% to 70%. Doppler parameters are consistent with abnormal left ventricular relaxation (grade 1 diastolic dysfunction). - Mitral valve: Significant calcification of posterior mitral annulus. Cannot completely exclude old infection in this region.Mildly thickened leaflets . Mild regurgitation. - Left atrium: The atrium was mildly dilated.   Surgical History:  Past Surgical History  Procedure Laterality Date  . Appendectomy    . Breast surgery      biopsy  . Left wrist surgery    . Bilateral cataracts    . Hip surgery      dr. Rayburn Ma, twice because the bones were spongy  . Tonsillectomy    . Adenoidectomy       Home Meds: Medication Sig  aspirin 81 MG EC tablet Take 81 mg by mouth every morning.   bisoprolol (ZEBETA) 5 MG tablet Take 2.5 mg by mouth every morning.   budesonide-formoterol (SYMBICORT) 160-4.5 MCG/ACT inhaler Inhale 1 puff into the lungs 2 (two) times daily.  Calcium Carbonate-Vit D-Min (CALTRATE 600+D PLUS) 600-400 MG-UNIT per tablet Chew 1 tablet by mouth 2 (two) times daily.   cholecalciferol (VITAMIN D) 1000 UNITS tablet Take 2,000 Units by mouth every morning.   citalopram (CELEXA) 20 MG tablet Take 20 mg by  mouth every morning.  ferrous sulfate 325 (65 FE) MG tablet Take 325 mg by mouth 2 (two) times daily.    furosemide (LASIX) 40 MG tablet Take 40 mg by mouth every morning.  lisinopril (PRINIVIL,ZESTRIL) 5 MG tablet Take 2.5 mg by mouth every morning.   loratadine (CLARITIN) 10 MG tablet Take 10 mg by mouth every morning.   morphine (MS CONTIN) 15 MG 12 hr tablet Take 15 mg by mouth 2 (two) times daily.  morphine (MSIR) 15 MG tablet Take 15 mg by mouth every 8 (eight) hours as needed for pain. Pain.  Multiple Vitamin (MULTIVITAMIN WITH MINERALS) TABS Take 1 tablet by mouth every morning.   Naproxen Sodium (ALEVE) 220 MG CAPS Take 1 capsule by mouth 2 (two) times daily.  ondansetron (ZOFRAN-ODT) 4 MG disintegrating tablet Take 4 mg by mouth every 8 (eight) hours as needed for nausea.  Probiotic Product (ALIGN) 4 MG CAPS Take 1 capsule by mouth every morning.  spironolactone (ALDACTONE) 25 MG tablet Take 12.5 mg by mouth every morning.  tiotropium (SPIRIVA) 18 MCG inhalation capsule Place 18 mcg into inhaler and inhale 2 (two) times daily.  vitamin C (ASCORBIC ACID) 500 MG tablet Take 500  mg by mouth 2 (two) times daily.   zinc gluconate 50 MG tablet Take 100 mg by mouth every morning.    Inpatient Medications:  . budesonide-formoterol  1 puff Inhalation BID  . cefTRIAXone (ROCEPHIN)  IV  1 g Intravenous Q24H  . heparin  5,000 Units Subcutaneous Q8H  . metronidazole  500 mg Intravenous Q8H  . ondansetron (ZOFRAN) IV  8 mg Intravenous Q6H  . sodium chloride  3 mL Intravenous Q12H  . tiotropium  18 mcg Inhalation Daily      Allergies:  Allergies  Allergen Reactions  . Sulfonamide Derivatives Swelling    History   Social History  . Marital Status: Single    Spouse Name: N/A    Number of Children: 2  . Years of Education: N/A   Occupational History  . Retired  - worked at a [employer] sales [employer]    Social History Main Topics  . Smoking status: Never Smoker   . Smokeless tobacco: Never Used  .  Alcohol Use: No  . Drug Use: No  . Sexually Active: Not Currently   Other Topics Concern  . Not on file   Social History Narrative   Pt has independent apartment at [place].  Rehab for last 5 weeks.  Uses a walker to get around.  She has had some falls since in rehab.       Family History  Problem Relation Age of Onset  . Diabetes Mother   . Heart disease Father   . Lung cancer Sister   . Alcohol abuse Sister   . Alcohol abuse Brother      Review of Systems: General: negative for chills, fever, night sweats or weight changes.  Cardiovascular: As per HPI Dermatological: negative for rash Respiratory: negative for cough or wheezing Urologic: negative for hematuria Abdominal: As per HPI Neurologic: negative for visual changes, syncope, or dizziness All other systems reviewed and are otherwise negative except as noted above.  Labs:  Component Value Date   WBC 17.7* 03/05/2012   HGB 8.8* 03/05/2012   HCT 28.0* 03/05/2012   MCV 85.9 03/05/2012   PLT 310 03/05/2012   Lab 03/05/12 0450  NA 134*  K 4.0  CL 100  CO2 27  BUN 10  CREATININE 0.88  CALCIUM 8.3*  PROT 5.4*  BILITOT 0.3  ALKPHOS 82  ALT 14  AST 25  GLUCOSE 111*    Radiology/Studies:   03/04/2012 - CT ABDOMEN AND PELVIS WITH CONTRAST  Findings: Examination limited by patient motion artifacts as well as by beam hardening artifacts in pelvis from a right hip prosthesis. Right pleural effusion and basilar atelectasis. Free intraperitoneal fluid perihepatic and in pelvis. High position of gallbladder between liver and diaphragm. Colon interposition between liver and diaphragm.  Mild fatty infiltration of liver. Bilateral renal cortical atrophy with nonobstructing calculus lower pole left kidney image 35. Small bilateral renal cysts. Liver, spleen, atrophic pancreas, kidneys, and adrenal glands otherwise unremarkable.  Chronic sigmoid colon thickening over approximately 10 cm length, little changed from previous study,  with associated infiltration of pericolic fat into sigmoid mesocolon. Numerous sigmoid diverticula noted. A focal gas collection 2 cm diameter adjacent to the posterior wall of the thickened sigmoid segment may represent a large diverticulum or a small contained perforation and is unchanged, axial image 62.  Air within urinary bladder question catheterization. Dilated right inguinal canal containing fat. Stomach remaining bowel loops grossly unremarkable. Scattered atherosclerotic calcifications. No mass, adenopathy, hernia, or free intraperitoneal air. Numerous pelvic phleboliths.  No free intraperitoneal air. Degenerative changes and scoliosis lumbar spine.  IMPRESSION: Persistent sigmoid wall thickening with pericolic inflammatory changes, question chronic diverticulitis versus inflammatory bowel disease in patient with diverticulosis. Underlying tumor not excluded; follow up colonoscopic assessment recommended.  Focal gas collection adjacent to the diseased segment of sigmoid colon may represent a large sigmoid diverticula though a small contained perforation not completely excluded, unchanged. Atrophic kidneys with bilateral cysts and nonobstructing left renal stone. Small amount of nonspecific free intraperitoneal fluid. Right inguinal hernia containing fat.    03/05/2012 -  PORTABLE CHEST - 1 VIEW  Findings: Cardiomediastinal silhouette is stable.  Significant elevation of the right hemidiaphragm again noted.  Stable right basilar atelectasis.  No focal infiltrate or convincing pulmonary edema.  IMPRESSION: Significant elevation of the right hemidiaphragm again noted. Stable right basilar atelectasis.  No focal infiltrate or convincing pulmonary edema.     EKG: 02/29/12 - NSR 69bpm, no acute ST/T changes.   Physical Exam: Blood pressure 148/77, pulse 74, temperature 98.3 F (36.8 C), temperature source Oral, resp. rate 20, height 5' (1.524 m), weight 147 lb 0.8 oz (66.7 kg), SpO2 97.00%. General: Frail  appearing, elderly white female, in no acute distress. Head: Normocephalic, atraumatic, sclera non-icteric, no xanthomas, nares are without discharge.  Neck: Supple. No JVD Lungs: Diminished throughout without wheezes, rales, or rhonchi. Breathing is unlabored on supplemental O2 Heart: RRR with S1 S2. 3/6 systolic apical murmur. No rubs or gallops appreciated. Abdomen: Soft, LLQ tenderness, hypoactive bowel sounds.No rebound. (+) voluntary guarding.  Msk:  Strength and tone appear normal for age. Extremities: PV insuff w/ dry dressings to bilat shins. No clubbing or cyanosis. No edema.  Distal pedal pulses are intact and equal bilaterally. Neuro: Alert and oriented X 3. Moves all extremities spontaneously. Psych:  Responds to questions appropriately with a normal affect.   Assessment and Plan:  79 y.o. female w/ PMHx significant for Combined Diastolic & Systolic CHF (EF 45-40%), myopericarditis, endocarditis, HTN, HLD, PV insuf w/ chronic LE wounds/edema, anemia, restrictive lung dz from chronically elevated right hemidiaphragm, and COPD (chronic O2) who was admitted to [facility] on 02/29/2012 with acute on chronic sigmoid diverticulitis and UTI and is being considered for abdominal surgery. Cardiology is asked to provide perioperative risk assessment from a cardiac standpoint.   1. Acute on Chronic Sigmoid Diverticulitis  2. E. Coli UTI 3. H/o LV dysfunction in 2012 w/ improved EF 65-70%, Chronic Diastolic CHF 4. H/o myopericarditis/endocarditis 5. Hypertension 6. Hyperlipidemia 7. Chronic Respiratory Failure due to COPD & restrictive lung disease, on chronic O2 8. Fe def Anemia 9. PV insufficiency w/ chronic LE wounds/edema  Ms. Furth appears to be clinically stable without anginal symptoms or signs of decompensated heart failure. Her LV systolic function has been normal since 12/2010. She was noted to have some mild volume overload in December, but per her report has had  significant improvement in her breathing and edema. She has no known CAD and reports being able to walk ~1/4 mile a couple days a week without anginal symptoms. She has no signs of volume overload on exam, but her weight is up 4lbs.  Her biggest risk regarding surgery is chronic lung disease on home oxgyen, age and malnutrition.  Note she is a DNR. There are no contraindications from a cardiac perspective regarding abdominal surgery.  She has a loud split first heart sound and SEM on exam and would recheck echo.    Charlton Haws 4:40 PM 03/05/2012

## 2012-03-05 NOTE — Telephone Encounter (Signed)
Spoke with Leigh.  She will take care of this.  Will route msg to Leigh.

## 2012-03-05 NOTE — Procedures (Signed)
R arm DL PICC No complication No blood loss. See complete dictation in [facility] PACS.  

## 2012-03-05 NOTE — Progress Notes (Signed)
Patient ID: Camika Marsico, female   DOB: 02-Feb-1927, 79 y.o.   MRN: [id number]    Subjective: No major complaints, does report poor po intake recently, abd less painful today  Objective: Vital signs in last 24 hours: Temp:  [98.1 F (36.7 C)-98.4 F (36.9 C)] 98.3 F (36.8 C) (03/04 0523) Pulse Rate:  [74-79] 74 (03/04 0523) Resp:  [18-20] 20 (03/04 0523) BP: (145-149)/(64-77) 148/77 mmHg (03/04 0523) SpO2:  [90 %-99 %] 97 % (03/04 0845) Weight:  [147 lb 0.8 oz (66.7 kg)] 147 lb 0.8 oz (66.7 kg) (03/04 0500) Last BM Date: 03/04/12  Intake/Output from previous day: 03/03 0701 - 03/04 0700 In: 750 [I.V.:450; IV Piggyback:300] Out: 1075 [Urine:1075] Intake/Output this shift: Total I/O In: -  Out: 175 [Urine:175]  PE: Abd: soft x for palp mass in LLQ/Left flank, +bs, tender over Left abdomen General: awake, alert, On O2, appears chronically ill but not acute.  Lab Results:   Recent Labs  03/04/12 0435 03/05/12 0450  WBC 15.5* 17.7*  HGB 9.3* 8.8*  HCT 29.8* 28.0*  PLT 328 310   BMET  Recent Labs  03/04/12 0435 03/05/12 0450  NA 138 134*  K 4.1 4.0  CL 103 100  CO2 28 27  GLUCOSE 92 111*  BUN 12 10  CREATININE 0.94 0.88  CALCIUM 8.4 8.3*   PT/INR No results found for this basename: LABPROT, INR,  in the last 72 hours CMP     Component Value Date/Time   NA 134* 03/05/2012 0450   NA 139 11/27/2011 1533   K 4.0 03/05/2012 0450   K 4.1 11/27/2011 1533   CL 100 03/05/2012 0450   CL 100 11/27/2011 1533   CO2 27 03/05/2012 0450   CO2 31* 11/27/2011 1533   GLUCOSE 111* 03/05/2012 0450   GLUCOSE 97 11/27/2011 1533   BUN 10 03/05/2012 0450   BUN 22.0 11/27/2011 1533   CREATININE 0.88 03/05/2012 0450   CREATININE 0.9 11/27/2011 1533   CALCIUM 8.3* 03/05/2012 0450   CALCIUM 10.0 11/27/2011 1533   PROT 5.4* 03/05/2012 0450   PROT 7.1 11/27/2011 1533   ALBUMIN 2.3* 03/05/2012 0450   ALBUMIN 3.4* 11/27/2011 1533   AST 25 03/05/2012 0450   AST 16 11/27/2011 1533   ALT 14  03/05/2012 0450   ALT 11 11/27/2011 1533   ALKPHOS 82 03/05/2012 0450   ALKPHOS 73 11/27/2011 1533   BILITOT 0.3 03/05/2012 0450   BILITOT 0.34 11/27/2011 1533   GFRNONAA 60* 03/05/2012 0450   GFRAA 69* 03/05/2012 0450   Lipase     Component Value Date/Time   LIPASE 8* 11/08/2011 1539       Studies/Results: Ct Abdomen Pelvis W Contrast  03/04/2012  *RADIOLOGY REPORT*  Clinical Data: Left lower quadrant abdominal pain, inflammatory process  CT ABDOMEN AND PELVIS WITH CONTRAST  Technique:  Multidetector CT imaging of the abdomen and pelvis was performed following the standard protocol during bolus administration of intravenous contrast. Sagittal and coronal MPR images reconstructed from axial data set.  Contrast: 80mL OMNIPAQUE IOHEXOL 300 MG/ML  SOLN Dilute oral contrast.  Comparison: 02/29/2012, 10/31/2011  Findings: Examination limited by patient motion artifacts as well as by beam hardening artifacts in pelvis from a right hip prosthesis. Right pleural effusion and basilar atelectasis. Free intraperitoneal fluid perihepatic and in pelvis. High position of gallbladder between liver and diaphragm. Colon interposition between liver and diaphragm.  Mild fatty infiltration of liver. Bilateral renal cortical atrophy with nonobstructing calculus lower pole left  kidney image 35. Small bilateral renal cysts. Liver, spleen, atrophic pancreas, kidneys, and adrenal glands otherwise unremarkable.  Chronic sigmoid colon thickening over approximately 10 cm length, little changed from previous study, with associated infiltration of pericolic fat into sigmoid mesocolon. Numerous sigmoid diverticula noted. A focal gas collection 2 cm diameter adjacent to the posterior wall of the thickened sigmoid segment may represent a large diverticulum or a small contained perforation and is unchanged, axial image 62.  Air within urinary bladder question catheterization. Dilated right inguinal canal containing fat. Stomach remaining  bowel loops grossly unremarkable. Scattered atherosclerotic calcifications. No mass, adenopathy, hernia, or free intraperitoneal air. Numerous pelvic phleboliths. No free intraperitoneal air. Degenerative changes and scoliosis lumbar spine.  IMPRESSION: Persistent sigmoid wall thickening with pericolic inflammatory changes, question chronic diverticulitis versus inflammatory bowel disease in patient with diverticulosis. Underlying tumor not excluded; follow up colonoscopic assessment recommended.  Focal gas collection adjacent to the diseased segment of sigmoid colon may represent a large sigmoid diverticula though a small contained perforation not completely excluded, unchanged. Atrophic kidneys with bilateral cysts and nonobstructing left renal stone. Small amount of nonspecific free intraperitoneal fluid. Right inguinal hernia containing fat.   Original Report Authenticated By: Ulyses Southward, M.D.     Anti-infectives: Anti-infectives   Start     Dose/Rate Route Frequency Ordered Stop   03/03/12 1200  cefTRIAXone (ROCEPHIN) 1 g in dextrose 5 % 50 mL IVPB     1 g 100 mL/hr over 30 Minutes Intravenous Every 24 hours 03/03/12 0958     02/29/12 2000  metroNIDAZOLE (FLAGYL) IVPB 500 mg     500 mg 100 mL/hr over 60 Minutes Intravenous Every 8 hours 02/29/12 1919     02/29/12 2000  ciprofloxacin (CIPRO) IVPB 400 mg  Status:  Discontinued     400 mg 200 mL/hr over 60 Minutes Intravenous Every 12 hours 02/29/12 1925 03/03/12 0958   02/29/12 1745  cefTRIAXone (ROCEPHIN) 1 g in dextrose 5 % 50 mL IVPB     1 g 100 mL/hr over 30 Minutes Intravenous  Once 02/29/12 1744 02/29/12 1859   02/29/12 1745  metroNIDAZOLE (FLAGYL) IVPB 500 mg  Status:  Discontinued     500 mg 100 mL/hr over 60 Minutes Intravenous  Once 02/29/12 1744 02/29/12 1929   02/29/12 1745  ciprofloxacin (CIPRO) IVPB 400 mg  Status:  Discontinued     400 mg 200 mL/hr over 60 Minutes Intravenous  Once 02/29/12 1744 02/29/12 1928        Assessment/Plan 1. Diverticulitis vs colitis of sigmoid colon: CT scan - 03/04/2012 - chronic sigmoid colon thickening over about 10 cm (similar to CT scan 03/01/2011 and may be a little worse than 10/31/2011)   --Per Dr. Ezzard Standing: With chronicity of the inflamed sigmoid colon, she would probably be best served with surgery, but unsure she would be able to tolerate this medically  --I spoke with Dr. Malachi Bonds, she will ask cardiology to see the patient for risk assessment, also [name]  --recommend NPO for right now and start TNA  --PICC ordered  --Dr. Donell Beers will see the patient later today to help with surgical risk assessment  LOS: 5 days    WHITE, ELIZABETH 03/05/2012

## 2012-03-05 NOTE — Progress Notes (Signed)
[facility] Progress Note  SUBJECTIVE: she feels a little better today than yesterday  OBJECTIVE:  Vital signs in last 24 hours: Temp:  [98.1 F (36.7 C)-98.4 F (36.9 C)] 98.3 F (36.8 C) (03/04 0523) Pulse Rate:  [74-79] 74 (03/04 0523) Resp:  [18-20] 20 (03/04 0523) BP: (145-149)/(64-77) 148/77 mmHg (03/04 0523) SpO2:  [90 %-99 %] 98 % (03/04 0523) Weight:  [147 lb 0.8 oz (66.7 kg)] 147 lb 0.8 oz (66.7 kg) (03/04 0500) Last BM Date: 03/04/12 General:   pleasant white female in NAD Abdomen:  Soft, nondistended. Still with moderate tenderness to LLQ. Normal bowel sounds. Extremities:  Without edema. Neurologic:  Alert and oriented,  grossly normal neurologically. Psych:  Cooperative. Normal mood and affect.   Lab Results:  Recent Labs  03/03/12 0505 03/04/12 0435 03/05/12 0450  WBC 15.2* 15.5* 17.7*  HGB 8.9* 9.3* 8.8*  HCT 28.7* 29.8* 28.0*  PLT 358 328 310   BMET  Recent Labs  03/03/12 0505 03/04/12 0435 03/05/12 0450  NA 137 138 134*  K 3.6 4.1 4.0  CL 102 103 100  CO2 28 28 27   GLUCOSE 162* 92 111*  BUN 14 12 10   CREATININE 0.87 0.94 0.88  CALCIUM 8.5 8.4 8.3*   LFT  Recent Labs  03/05/12 0450  PROT 5.4*  ALBUMIN 2.3*  AST 25  ALT 14  ALKPHOS 82  BILITOT 0.3    Studies/Results: Ct Abdomen Pelvis W Contrast  03/04/2012  *RADIOLOGY REPORT*  Clinical Data: Left lower quadrant abdominal pain, inflammatory process  CT ABDOMEN AND PELVIS WITH CONTRAST  Technique:  Multidetector CT imaging of the abdomen and pelvis was performed following the standard protocol during bolus administration of intravenous contrast. Sagittal and coronal MPR images reconstructed from axial data set.  Contrast: 80mL OMNIPAQUE IOHEXOL 300 MG/ML  SOLN Dilute oral contrast.  Comparison: 02/29/2012, 10/31/2011  Findings: Examination limited by patient motion artifacts as well as by beam hardening artifacts in pelvis from a right hip prosthesis. Right pleural effusion  and basilar atelectasis. Free intraperitoneal fluid perihepatic and in pelvis. High position of gallbladder between liver and diaphragm. Colon interposition between liver and diaphragm.  Mild fatty infiltration of liver. Bilateral renal cortical atrophy with nonobstructing calculus lower pole left kidney image 35. Small bilateral renal cysts. Liver, spleen, atrophic pancreas, kidneys, and adrenal glands otherwise unremarkable.  Chronic sigmoid colon thickening over approximately 10 cm length, little changed from previous study, with associated infiltration of pericolic fat into sigmoid mesocolon. Numerous sigmoid diverticula noted. A focal gas collection 2 cm diameter adjacent to the posterior wall of the thickened sigmoid segment may represent a large diverticulum or a small contained perforation and is unchanged, axial image 62.  Air within urinary bladder question catheterization. Dilated right inguinal canal containing fat. Stomach remaining bowel loops grossly unremarkable. Scattered atherosclerotic calcifications. No mass, adenopathy, hernia, or free intraperitoneal air. Numerous pelvic phleboliths. No free intraperitoneal air. Degenerative changes and scoliosis lumbar spine.  IMPRESSION: Persistent sigmoid wall thickening with pericolic inflammatory changes, question chronic diverticulitis versus inflammatory bowel disease in patient with diverticulosis. Underlying tumor not excluded; follow up colonoscopic assessment recommended.  Focal gas collection adjacent to the diseased segment of sigmoid colon may represent a large sigmoid diverticula though a small contained perforation not completely excluded, unchanged. Atrophic kidneys with bilateral cysts and nonobstructing left renal stone. Small amount of nonspecific free intraperitoneal fluid. Right inguinal hernia containing fat.   Original Report Authenticated By: Ulyses Southward, M.D.  ASSESSMENT / PLAN:   1. LLQ pain, presumably secondary to sigmoid  diverticulitis. Repeat CTscan yesterday reveals persistent sigmoid wall thickening / pericolic inflammatory changes, question chronic diverticulitis vs inflammatory bowel disease,vs neoplasm. Focal gas collection adjacent to the diseased segment of sigmoid colon may be large sigmoid diverticula but a small contained perforation not complete. Appreciate surgery's input. Cardiology, as well as patient's PCP [name], to help with patient's surgical risk assessment. For now, patient NPO. PICC line and TNA were ordered. On day #6 of IV Flagyl. Cipro d/ced yesterday, patient switched to Rocephin based on urine c&s  2. UTI, on Rocephin  3. Multiple medical problems as listed in PMH      LOS: 5 days   Willette Cluster  03/05/2012, 8:34 AM  I have personally taken an interval history, reviewed the chart, and examined the patient.  I agree with the extender's note, impression and recommendations. She is slightly improved.  There is still some question of etiology for colitis.  Should she improve then at a later date it may be safe to do a very limited sigmoidoscopy.  For right now I would avoid this.  Barbette Hair. Arlyce Dice, MD, [facility] [phone]

## 2012-03-05 NOTE — Telephone Encounter (Signed)
i called and spoke with SN and he has the number to call Dr. Malachi Bonds. Nothing further is needed.

## 2012-03-05 NOTE — Progress Notes (Signed)
PARENTERAL NUTRITION CONSULT NOTE - INITIAL  Pharmacy Consult for TNA Indication: Intolerance to enteral feedings, Diverticulitis vs colitis of sigmoid colon  Allergies  Allergen Reactions  . Sulfonamide Derivatives Swelling    Patient Measurements: Height: 5' (152.4 cm) Weight: 147 lb 0.8 oz (66.7 kg) IBW/kg (Calculated) : 45.5   Vital Signs: Temp: 98.3 F (36.8 C) (03/04 0523) Temp src: Oral (03/04 0523) BP: 148/77 mmHg (03/04 0523) Pulse Rate: 74 (03/04 0523) Intake/Output from previous day: 03/03 0701 - 03/04 0700 In: 750 [I.V.:450; IV Piggyback:300] Out: 1075 [Urine:1075] Intake/Output from this shift: Total I/O In: 316.7 [I.V.:316.7] Out: 175 [Urine:175]  Labs:  Recent Labs  03/03/12 0505 03/04/12 0435 03/05/12 0450  WBC 15.2* 15.5* 17.7*  HGB 8.9* 9.3* 8.8*  HCT 28.7* 29.8* 28.0*  PLT 358 328 310     Recent Labs  03/03/12 0505 03/04/12 0435 03/05/12 0450  NA 137 138 134*  K 3.6 4.1 4.0  CL 102 103 100  CO2 28 28 27   GLUCOSE 162* 92 111*  BUN 14 12 10   CREATININE 0.87 0.94 0.88  CALCIUM 8.5 8.4 8.3*  PROT 5.4* 5.3* 5.4*  ALBUMIN 2.1* 2.2* 2.3*  AST 11 24 25   ALT 7 11 14   ALKPHOS 79 78 82  BILITOT 0.2* 0.3 0.3   Estimated Creatinine Clearance: 42 ml/min (by C-G formula based on Cr of 0.88).   No results found for this basename: GLUCAP,  in the last 72 hours  Medical History: Past Medical History  Diagnosis Date  . Disorders of diaphragm     right diaphragm elevation  . Unspecified essential hypertension   . Endocarditis, valve unspecified, unspecified cause   . Cardiac dysrhythmia, unspecified   . Unspecified venous (peripheral) insufficiency   . Edema   . Pure hypercholesterolemia   . Other dysphagia   . Diverticulosis of colon (without mention of hemorrhage)   . Benign neoplasm of colon   . Osteoarthrosis, unspecified whether generalized or localized, unspecified site   . Lumbago   . Chronic pain syndrome   . Disorder of  bone and cartilage, unspecified   . Depressive disorder, not elsewhere classified   . Anemia, unspecified   . Skin cancer   . Systolic heart failure     echo 10/10/10 EF 30%  . Hiatal hernia   . Gastritis   . COPD (chronic obstructive pulmonary disease)   . Lower extremity ulceration     dressing changes daily    Insulin Requirements in the past 24 hours:  None - No history of diabetes  Nutritional Goals:  RD recs: Kcal: 1420-1660, Protein: 80-94 grams, Fluid: 2 L  Clinimix E5/20  at a goal rate of 14ml/hr + IVFE 20% at 61ml/hr on MWF to provide: 84g/day protein, 1684Kcal/day avg.(1478Kcal/day STTHS, 1958Kcal/day MWF).  Current Nutrition:  NPO  IVF:  None   Assessment:   71 yoF with acute on chronic sigmoiditis with possible chronic diverticulitis vs underlying malignancy, also with hx perimyocarditis now resolved.  CT 3/3 showed small area of air either contained within diverticulum or escaped from bowel.  Surgery and cardiolgoy consulted and investigating perioperative risk.  Pt also on antibiotics for E.Coli UTI.  PICC placement planned for today.    Pt reports usual body weight is 144 lbs and that she had a good appetite and was eating well PTA. Pt states that she usually eats 2 meals daily and drinks 1 Ensure supplement in the middle of the day.     Glucose -  At goal 92-162  Electrolytes -Na 134, other lytes WNL, Corr Ca: 9.66  LFTs -WNL  TGs - Will check 3/5, last value 98 from 2012  Prealbumin - Will check 3/5  Plan:  At 1800 today:  Start Clinimix E5/20 at 63ml/hr.  Fat emulsion at 33ml/hr(MWF only due to ongoing shortage).  Plan to advance as tolerated to the goal rate.  TNA to contain standard multivitamins and trace elements(MWF only due to ongoing shortage).  Add SSI and CBG checks q 4 hours   TNA lab panels on Mondays & Thursdays.  F/u daily.   Haynes Hoehn, PharmD 03/05/2012 10:33 AM  Pager: [phone]

## 2012-03-05 NOTE — Progress Notes (Signed)
INITIAL NUTRITION ASSESSMENT  DOCUMENTATION CODES Per approved criteria  -Not Applicable   INTERVENTION: TPN per PharmD  NUTRITION DIAGNOSIS: Inadequate oral intake related to inability to eat as evidenced by NPO status.   Goal: Pt to meet >/= 90% of their estimated nutrition needs  Monitor:  TPN initiation  Wt  Reason for Assessment: Consult for TPN/TNA  79 y.o. female  Admitting Dx: Diverticulitis  ASSESSMENT: 79 y.o. year-old female with history of likely ischemic LVEF 30%, restrictive lung disease due to elevated diaphragm, COPD, GERD, endocarditis, recurrent falls, recurrent UTIs, and hx of diverticulitis who presents with abdominal pain, N/V and fatigue. CT abd/pelvis demonstrated "Exuberant inflammatory process involving the sigmoid colon appears chronic. This inflammation is present on comparison exam of 10/31/2011 but is worse now." Pt reports that her usual body weight is 144 lbs and that she had a good appetite and was eating well PTA. Pt states that she usually eats 2 meals daily and drinks 1 Ensure supplement in the middle of the day. Pt lives alone, denies food insecurity but reports decreased interest in preparing foods which is why she drinks Ensure for lunch. Pt reports having a good appetite now; no nausea    Height: Ht Readings from Last 1 Encounters:  02/29/12 5' (1.524 m)    Weight: Wt Readings from Last 1 Encounters:  03/05/12 147 lb 0.8 oz (66.7 kg)    Ideal Body Weight: 100 lbs  % Ideal Body Weight: 147%  Wt Readings from Last 10 Encounters:  03/05/12 147 lb 0.8 oz (66.7 kg)  01/09/12 151 lb 6.4 oz (68.675 kg)  01/01/12 151 lb 6.4 oz (68.675 kg)  12/28/11 152 lb (68.947 kg)  12/14/11 151 lb 12 oz (68.833 kg)  11/27/11 148 lb 1.6 oz (67.178 kg)  11/23/11 147 lb 8 oz (66.906 kg)  11/15/11 146 lb 11.2 oz (66.543 kg)  11/10/11 149 lb (67.586 kg)  11/08/11 147 lb 8 oz (66.906 kg)    Usual Body Weight: 144 lb  % Usual Body Weight:  102%  BMI:  Body mass index is 28.72 kg/(m^2).  Estimated Nutritional Needs: Kcal: 1420-1660 Protein: 80-94 grams Fluid: 2 L  Skin: Ecchymosis on arm, hand, leg; hematoma on leg; abrasion on right lower leg; skin tear on elbow  Diet Order: NPO  EDUCATION NEEDS: -No education needs identified at this time   Intake/Output Summary (Last 24 hours) at 03/05/12 1431 Last data filed at 03/05/12 1020  Gross per 24 hour  Intake 1066.67 ml  Output   1000 ml  Net  66.67 ml    Last BM: 3/3  Labs:   Recent Labs Lab 03/03/12 0505 03/04/12 0435 03/05/12 0450  NA 137 138 134*  K 3.6 4.1 4.0  CL 102 103 100  CO2 28 28 27   BUN 14 12 10   CREATININE 0.87 0.94 0.88  CALCIUM 8.5 8.4 8.3*  GLUCOSE 162* 92 111*    CBG (last 3)  No results found for this basename: GLUCAP,  in the last 72 hours  Scheduled Meds: . budesonide-formoterol  1 puff Inhalation BID  . cefTRIAXone (ROCEPHIN)  IV  1 g Intravenous Q24H  . heparin  5,000 Units Subcutaneous Q8H  . metronidazole  500 mg Intravenous Q8H  . ondansetron (ZOFRAN) IV  8 mg Intravenous Q6H  . sodium chloride  3 mL Intravenous Q12H  . tiotropium  18 mcg Inhalation Daily    Continuous Infusions:   Past Medical History  Diagnosis Date  . Disorders of  diaphragm     right diaphragm elevation  . Unspecified essential hypertension   . Endocarditis, valve unspecified, unspecified cause   . Cardiac dysrhythmia, unspecified   . Unspecified venous (peripheral) insufficiency   . Edema   . Pure hypercholesterolemia   . Other dysphagia   . Diverticulosis of colon (without mention of hemorrhage)   . Benign neoplasm of colon   . Osteoarthrosis, unspecified whether generalized or localized, unspecified site   . Lumbago   . Chronic pain syndrome   . Disorder of bone and cartilage, unspecified   . Depressive disorder, not elsewhere classified   . Anemia, unspecified   . Skin cancer   . Systolic heart failure     echo 10/10/10 EF 30%   . Hiatal hernia   . Gastritis   . COPD (chronic obstructive pulmonary disease)   . Lower extremity ulceration     dressing changes daily    Past Surgical History  Procedure Laterality Date  . Appendectomy    . Breast surgery      biopsy  . Left wrist surgery    . Bilateral cataracts    . Hip surgery      dr. Rayburn Ma, twice because the bones were spongy  . Tonsillectomy    . Adenoidectomy      Ian Malkin RD, LDN Inpatient Clinical Dietitian Pager: [phone] After Hours Pager: [phone]

## 2012-03-05 NOTE — Progress Notes (Addendum)
[facility] PROGRESS NOTE  Katie Galvan ZOX:[id number] DOB: 05-13-1927 DOA: 02/29/2012 PCP: Michele Mcalpine, MD  Assessment/Plan  Sigmoiditis, acute on chronic.   Had diverticulitis in same area in 2012, then 11/2011.  She may have chronic divericulitis but is it also possible that she has an underlying malignancy.  Her repeat CT from 3/3 demonstrated either a small area of air either contained within a diverticulum or escaped from the bowel.  WBC continues to rise.  She was seen by surgery on 3/3 and we are investigating her perioperative risk.  I have called [facility] for who follows her for hx of perimyocarditis (now resolved) and now diastolic heart failure.  Able to walk 1-2 blocks before dyspneic.  Spoke with [name] who agrees that the patient would like be moderate or high risk for surgery.    -  NPO and TNA -  Continue scheduled Zofran with phenergan prn - Stool culture & O&P not drawn -  C. Diff neg -  Occult positive -  Lactoferrin positive - Continue antibiotics (now ceftriaxone/flagyl) day 5 - Appreciate GI and surgery recommendations -  Increase morphine dose and frequency (pt has previously been narcotic tolerant and dependent) -  Cardiology consult for perioperative risk assessment  UTI, E. coli intermediate resistance to cipro.  - Continue ceftriaxone day 3  HYPERTENSION/HLD, chronic diastolic grade 1 /systolic (EF now 81-19%) CHF stable.  BP mildly elevated.  Weight is up >2kg.   - hold oral medications for now  - CXR and BNP today - continue prn labetalol for SBP > 160  - Daily weights stable and strict I/O  Chronic respiratory failure with hypoxia due to COPD, restrictive lung disease from elevated right hemidiaphragm, stable.  - Continue 2L [place]  - Continue symbicort and spiriva with albuterol prn  -  Incentive spirometry  Pancreatic mass, stable and being followed by GI.    Bilateral lower extremity ulcers, appear to be healing well  - Continue  dressing changes daily   Leukocytosis, unspecified, likely related to UTI and sigmoiditis.  Rising despite antibiotics possibly due to microperforation or underlying malignancy - Trend, tx as above   Iron deficiency anemia,  hgb stable.   - Tx for hemoglobin < 7  -  Iron studies sent by GI:  Ferritin elevated likely due to inflammation, but iron level low. -  occult stool positive  Diet: NPO.  TNA pending PICC Access: PIV >> PICC ordered IVF:  OFF  Proph: heparin   Code Status: DNR  Family Communication: spoke with patient alone today.  She would like to know her estimated mortality risk for surgery so that she may decide between hospice care and surgery.  She will discuss with her son.  [name] will call her son to give him updates also.   Disposition Plan:  Not medically stable.  If she undergoes surgery, possibly to SNF when medically stable.  If she declines surgery, possibly to SNF with palliative care or hospice.  PT/OT recs in chart.  Consultants:  GI, Dr. Arlyce Dice  General surgery  Procedures:  CT abd/pelvis  Antibiotics:  cipro 2/27 >> 3/2  Flagyl 2/27 >>   Ceftriaxone 3/2 >>  HPI/Subjective:  Denies fevers, chills, headache, chest pain and shortness of breath. Nausea improved somewhat.  Stable LLQ abdominal pain.   Objective: Filed Vitals:   03/04/12 2106 03/05/12 0500 03/05/12 0523 03/05/12 0845  BP: 149/64  148/77   Pulse: 79  74   Temp: 98.1 F (  36.7 C)  98.3 F (36.8 C)   TempSrc: Oral  Oral   Resp: 20  20   Height:      Weight:  66.7 kg (147 lb 0.8 oz)    SpO2: 97%  98% 97%    Intake/Output Summary (Last 24 hours) at 03/05/12 1013 Last data filed at 03/05/12 0746  Gross per 24 hour  Intake    750 ml  Output   1150 ml  Net   -400 ml   Filed Weights   03/03/12 0500 03/04/12 0500 03/05/12 0500  Weight: 65.9 kg (145 lb 4.5 oz) 66 kg (145 lb 8.1 oz) 66.7 kg (147 lb 0.8 oz)    Exam:   General:  CF, mild respiratory distress with SCM  restractions and mild tachypnea  HEENT:  NCAT, MMM  Cardiovascular:   RRR, nl S1, S2 no mrg, 2+ pulses, warm extremities  Respiratory:   Diminished breath sounds on right chest and back, faint rales at left base.  No rhonchi or wheeze.    Abdomen:  Rare bowel sounds, tender in the LLQ with voluntary guarding, no rebound  MSK:   Normal tone and bulk, trace LEE  Neuro:  Grossly intact  Skin:  No skin tenting.  Data Reviewed: Basic Metabolic Panel:  Recent Labs Lab 03/01/12 0536 03/02/12 0542 03/03/12 0505 03/04/12 0435 03/05/12 0450  NA 139 142 137 138 134*  K 3.8 3.9 3.6 4.1 4.0  CL 101 105 102 103 100  CO2 31 29 28 28 27   GLUCOSE 130* 95 162* 92 111*  BUN 33* 21 14 12 10   CREATININE 1.04 0.96 0.87 0.94 0.88  CALCIUM 8.8 8.8 8.5 8.4 8.3*   Liver Function Tests:  Recent Labs Lab 03/01/12 0536 03/02/12 0542 03/03/12 0505 03/04/12 0435 03/05/12 0450  AST 9 12 11 24 25   ALT 9 8 7 11 14   ALKPHOS 104 91 79 78 82  BILITOT 0.2* 0.3 0.2* 0.3 0.3  PROT 6.0 6.2 5.4* 5.3* 5.4*  ALBUMIN 2.2* 2.3* 2.1* 2.2* 2.3*   No results found for this basename: LIPASE, AMYLASE,  in the last 168 hours No results found for this basename: AMMONIA,  in the last 168 hours CBC:  Recent Labs Lab 02/29/12 1535 03/01/12 0536 03/02/12 0542 03/03/12 0505 03/04/12 0435 03/05/12 0450  WBC 11.8* 12.5* 11.4* 15.2* 15.5* 17.7*  NEUTROABS 8.4*  --   --   --   --   --   HGB 11.1* 9.1* 9.6* 8.9* 9.3* 8.8*  HCT 34.2* 28.5* 31.3* 28.7* 29.8* 28.0*  MCV 86.4 87.4 87.4 87.2 87.4 85.9  PLT 446* 363 354 358 328 310   Cardiac Enzymes: No results found for this basename: CKTOTAL, CKMB, CKMBINDEX, TROPONINI,  in the last 168 hours BNP (last 3 results)  Recent Labs  08/02/11 1630 11/02/11 0623 11/03/11 0628  PROBNP 1420.0* 4539.0* 1925.0*   CBG: No results found for this basename: GLUCAP,  in the last 168 hours  Recent Results (from the past 240 hour(s))  URINE CULTURE     Status: None    Collection Time    02/29/12  3:51 PM      Result Value Range Status   Specimen Description URINE, CLEAN CATCH   Final   Special Requests NONE   Final   Culture  Setup Time 03/01/2012 01:39   Final   Colony Count >=100,000 COLONIES/ML   Final   Culture ESCHERICHIA COLI   Final   Report Status 03/02/2012 FINAL  Final   Organism ID, Bacteria ESCHERICHIA COLI   Final  CLOSTRIDIUM DIFFICILE BY PCR     Status: None   Collection Time    03/03/12  1:27 AM      Result Value Range Status   C difficile by pcr NEGATIVE  NEGATIVE Final     Studies: Ct Abdomen Pelvis W Contrast  03/04/2012  *RADIOLOGY REPORT*  Clinical Data: Left lower quadrant abdominal pain, inflammatory process  CT ABDOMEN AND PELVIS WITH CONTRAST  Technique:  Multidetector CT imaging of the abdomen and pelvis was performed following the standard protocol during bolus administration of intravenous contrast. Sagittal and coronal MPR images reconstructed from axial data set.  Contrast: 80mL OMNIPAQUE IOHEXOL 300 MG/ML  SOLN Dilute oral contrast.  Comparison: 02/29/2012, 10/31/2011  Findings: Examination limited by patient motion artifacts as well as by beam hardening artifacts in pelvis from a right hip prosthesis. Right pleural effusion and basilar atelectasis. Free intraperitoneal fluid perihepatic and in pelvis. High position of gallbladder between liver and diaphragm. Colon interposition between liver and diaphragm.  Mild fatty infiltration of liver. Bilateral renal cortical atrophy with nonobstructing calculus lower pole left kidney image 35. Small bilateral renal cysts. Liver, spleen, atrophic pancreas, kidneys, and adrenal glands otherwise unremarkable.  Chronic sigmoid colon thickening over approximately 10 cm length, little changed from previous study, with associated infiltration of pericolic fat into sigmoid mesocolon. Numerous sigmoid diverticula noted. A focal gas collection 2 cm diameter adjacent to the posterior wall of the  thickened sigmoid segment may represent a large diverticulum or a small contained perforation and is unchanged, axial image 62.  Air within urinary bladder question catheterization. Dilated right inguinal canal containing fat. Stomach remaining bowel loops grossly unremarkable. Scattered atherosclerotic calcifications. No mass, adenopathy, hernia, or free intraperitoneal air. Numerous pelvic phleboliths. No free intraperitoneal air. Degenerative changes and scoliosis lumbar spine.  IMPRESSION: Persistent sigmoid wall thickening with pericolic inflammatory changes, question chronic diverticulitis versus inflammatory bowel disease in patient with diverticulosis. Underlying tumor not excluded; follow up colonoscopic assessment recommended.  Focal gas collection adjacent to the diseased segment of sigmoid colon may represent a large sigmoid diverticula though a small contained perforation not completely excluded, unchanged. Atrophic kidneys with bilateral cysts and nonobstructing left renal stone. Small amount of nonspecific free intraperitoneal fluid. Right inguinal hernia containing fat.   Original Report Authenticated By: Ulyses Southward, M.D.     Scheduled Meds: . budesonide-formoterol  1 puff Inhalation BID  . cefTRIAXone (ROCEPHIN)  IV  1 g Intravenous Q24H  . heparin  5,000 Units Subcutaneous Q8H  . metronidazole  500 mg Intravenous Q8H  . ondansetron (ZOFRAN) IV  8 mg Intravenous Q6H  . sodium chloride  3 mL Intravenous Q12H  . tiotropium  18 mcg Inhalation Daily   Continuous Infusions: . dextrose 5 % and 0.45 % NaCl with KCl 20 mEq/L 50 mL/hr at 03/04/12 1245    Principal Problem:   Diverticulitis Active Problems:   HYPERTENSION   UTI (lower urinary tract infection)   Chronic respiratory failure with hypoxia   Diastolic CHF, chronic   Pancreatic mass   Anemia   Wound of left leg   Leukocytosis, unspecified   Nonspecific (abnormal) findings on radiological and other examination of  gastrointestinal tract    Time spent: 30 min    SHORT, [facility]  [facility] Pager [phone]. If 7PM-7AM, please contact night-coverage at [email or web address], password [facility] 03/05/2012, 10:13 AM  LOS: 5 days

## 2012-03-05 NOTE — Progress Notes (Signed)
Physical Therapy Treatment Patient Details Name: Katie Galvan MRN: [id number] DOB: [birth year] Today's Date: 03/05/2012 Time: 1884-1660 PT Time Calculation (min): 12 min  PT Assessment / Plan / Recommendation Comments on Treatment Session  Pt assisted to [facility] and then took a couple steps over to recliner.  Pt declined any further therapy due to fatigue.    Follow Up Recommendations  SNF     Does the patient have the potential to tolerate intense rehabilitation     Barriers to Discharge        Equipment Recommendations  None recommended by PT    Recommendations for Other Services    Frequency     Plan Discharge plan remains appropriate;Frequency remains appropriate    Precautions / Restrictions Precautions Precautions: Fall Precaution Comments: O2 dependent   Pertinent Vitals/Pain N/a    Mobility  Bed Mobility Bed Mobility: Supine to Sit Supine to Sit: 3: Mod assist;HOB elevated;With rails Details for Bed Mobility Assistance: assist for trunk, verbal cues for technique Transfers Transfers: Stand to Sit;Sit to Stand;Stand Pivot Transfers Sit to Stand: 4: Min assist Stand to Sit: 4: Min assist Stand Pivot Transfers: 4: Min assist Details for Transfer Assistance: pt declined RW for stand pivot to [facility], assist to steady, pt uses UEs well to assist with bed rail and BSC armrests Ambulation/Gait Ambulation/Gait Assistance: 4: Min guard Ambulation Distance (Feet): 3 Feet Assistive device: Rolling walker Ambulation/Gait Assistance Details: pt given RW to take a few steps to recliner from [facility] and much appeared much steadier with assistive device, pt declined any further ambulation due to fatigue, pt with heavy breathing however states her SOB is her usual for activity, maintained O2 [place] throughout session Gait Pattern: Step-to pattern;Trunk flexed    Exercises     PT Diagnosis:    PT Problem List:   PT Treatment Interventions:     PT Goals Acute Rehab PT Goals PT Goal:  Supine/Side to Sit - Progress: Progressing toward goal PT Goal: Sit to Stand - Progress: Progressing toward goal PT Goal: Ambulate - Progress: Progressing toward goal  Visit Information  Last PT Received On: 03/05/12 Assistance Needed: +1    Subjective Data  Subjective: I can't walk today.   Cognition  Cognition Overall Cognitive Status: Appears within functional limits for tasks assessed/performed Arousal/Alertness: Awake/alert Orientation Level: Appears intact for tasks assessed Behavior During Session: [facility] for tasks performed    Balance     End of Session PT - End of Session Activity Tolerance: Patient limited by fatigue Patient left: in chair;with call bell/phone within reach   GP     Katie Galvan,Katie Galvan 03/05/2012, 1:41 PM Zenovia Jarred, PT, DPT 03/05/2012 Pager: [phone]

## 2012-03-06 DIAGNOSIS — J9620 Acute and chronic respiratory failure, unspecified whether with hypoxia or hypercapnia: Secondary | ICD-10-CM

## 2012-03-06 DIAGNOSIS — I309 Acute pericarditis, unspecified: Secondary | ICD-10-CM

## 2012-03-06 DIAGNOSIS — I059 Rheumatic mitral valve disease, unspecified: Secondary | ICD-10-CM

## 2012-03-06 DIAGNOSIS — K5730 Diverticulosis of large intestine without perforation or abscess without bleeding: Secondary | ICD-10-CM

## 2012-03-06 LAB — GLUCOSE, CAPILLARY
Glucose-Capillary: 142 mg/dL — ABNORMAL HIGH (ref 70–99)
Glucose-Capillary: 112 mg/dL — ABNORMAL HIGH (ref 70–99)
Glucose-Capillary: 139 mg/dL — ABNORMAL HIGH (ref 70–99)

## 2012-03-06 LAB — DIFFERENTIAL
Basophils Relative: 1 % (ref 0–1)
Eosinophils Relative: 2 % (ref 0–5)
Lymphs Abs: 1.4 10*3/uL (ref 0.7–4.0)
Monocytes Absolute: 1.2 10*3/uL — ABNORMAL HIGH (ref 0.1–1.0)

## 2012-03-06 LAB — MAGNESIUM: Magnesium: 1.7 mg/dL (ref 1.5–2.5)

## 2012-03-06 LAB — COMPREHENSIVE METABOLIC PANEL
ALT: 11 U/L (ref 0–35)
BUN: 11 mg/dL (ref 6–23)
CO2: 30 mEq/L (ref 19–32)
Calcium: 8.3 mg/dL — ABNORMAL LOW (ref 8.4–10.5)
Creatinine, Ser: 0.84 mg/dL (ref 0.50–1.10)
GFR calc Af Amer: 73 mL/min — ABNORMAL LOW (ref >90)
GFR calc non Af Amer: 63 mL/min — ABNORMAL LOW (ref >90)
Glucose, Bld: 152 mg/dL — ABNORMAL HIGH (ref 70–99)
Sodium: 138 mEq/L (ref 135–145)

## 2012-03-06 LAB — CBC
Hemoglobin: 8.7 g/dL — ABNORMAL LOW (ref 12.0–15.0)
MCHC: 31.1 g/dL (ref 30.0–36.0)

## 2012-03-06 LAB — TRIGLYCERIDES: Triglycerides: 80 mg/dL (ref <150)

## 2012-03-06 LAB — PREALBUMIN: Prealbumin: 7.9 mg/dL — ABNORMAL LOW (ref 17.0–34.0)

## 2012-03-06 MED ORDER — POTASSIUM PHOSPHATE DIBASIC 3 MMOLE/ML IV SOLN
10.0000 mmol | Freq: Once | INTRAVENOUS | Status: AC
  Administered 2012-03-06: 10 mmol via INTRAVENOUS
  Filled 2012-03-06: qty 3.33

## 2012-03-06 MED ORDER — SODIUM CHLORIDE 0.9 % IV SOLN
INTRAVENOUS | Status: DC
  Administered 2012-03-06 – 2012-03-16 (×6): via INTRAVENOUS
  Administered 2012-03-17: 20 mL/h via INTRAVENOUS
  Administered 2012-03-20 – 2012-03-24 (×4): via INTRAVENOUS
  Administered 2012-03-26: 20 mL/h via INTRAVENOUS

## 2012-03-06 MED ORDER — SODIUM CHLORIDE 0.9 % IV SOLN
1.0000 g | INTRAVENOUS | Status: DC
  Administered 2012-03-06 – 2012-03-09 (×4): 1 g via INTRAVENOUS
  Filled 2012-03-06 (×5): qty 1

## 2012-03-06 MED ORDER — SODIUM CHLORIDE 0.9 % IV SOLN
INTRAVENOUS | Status: DC
Start: 2012-03-06 — End: 2012-03-15
  Administered 2012-03-06 – 2012-03-14 (×5): via INTRAVENOUS

## 2012-03-06 MED ORDER — TRACE MINERALS CR-CU-F-FE-I-MN-MO-SE-ZN IV SOLN
INTRAVENOUS | Status: AC
  Administered 2012-03-06: 18:00:00 via INTRAVENOUS
  Filled 2012-03-06: qty 2000

## 2012-03-06 MED ORDER — FAT EMULSION 20 % IV EMUL
250.0000 mL | INTRAVENOUS | Status: AC
  Administered 2012-03-06: 250 mL via INTRAVENOUS
  Filled 2012-03-06: qty 250

## 2012-03-06 NOTE — Progress Notes (Signed)
Pt feels worse today. Had discussion with patient and son regarding possible options and consequences/ complications. Advised them that surgery would lead to ostomy and open wound in order to minimize risk of leak/wound infection/hernia.  Reviewed post op course best/worse case scenarios.    Advised patient that decision does not have to be made today, but if she continues to worsen tomorrow, decision may be more urgent.    Have changed antibiotics to see if any difference.    Will re-evaluate tomorrow.

## 2012-03-06 NOTE — Progress Notes (Signed)
    SUBJECTIVE: Only c/o abdominal pain today. No chest pain or SOB.   BP 133/66  Pulse 79  Temp(Src) 98.1 F (36.7 C) (Oral)  Resp 20  Ht 5' (1.524 m)  Wt 147 lb 11.3 oz (67 kg)  BMI 28.85 kg/m2  SpO2 95%  Intake/Output Summary (Last 24 hours) at 03/06/12 0715 Last data filed at 03/06/12 0300  Gross per 24 hour  Intake 776.67 ml  Output   1025 ml  Net -248.33 ml    PHYSICAL EXAM General: Well developed, well nourished, in no acute distress. Alert and oriented x 3.  Psych:  Good affect, responds appropriately Neck: No JVD. No masses noted.  Lungs: Clear bilaterally with no wheezes or rhonci noted.  Heart: RRR with no murmurs noted. Abdomen: Bowel sounds are present. Soft, non-tender.  Extremities: Trace bilateral lower extremity edema.   LABS: Basic Metabolic Panel:  Recent Labs  96/04/54 0450 03/06/12 0451  NA 134* 138  K 4.0 3.8  CL 100 101  CO2 27 30  GLUCOSE 111* 152*  BUN 10 11  CREATININE 0.88 0.84  CALCIUM 8.3* 8.3*  MG  --  1.7  PHOS  --  2.1*   CBC:  Recent Labs  03/05/12 0450 03/06/12 0451  WBC 17.7* 15.5*  NEUTROABS  --  12.4*  HGB 8.8* 8.7*  HCT 28.0* 28.0*  MCV 85.9 85.6  PLT 310 318   Current Meds: . budesonide-formoterol  1 puff Inhalation BID  . cefTRIAXone (ROCEPHIN)  IV  1 g Intravenous Q24H  . heparin  5,000 Units Subcutaneous Q8H  . insulin aspart  0-9 Units Subcutaneous Q4H  . metronidazole  500 mg Intravenous Q8H  . ondansetron (ZOFRAN) IV  8 mg Intravenous Q6H  . sodium chloride  3 mL Intravenous Q12H  . tiotropium  18 mcg Inhalation Daily   Principal Problem:   Diverticulitis Active Problems:   HYPERTENSION   UTI (lower urinary tract infection)   Chronic respiratory failure with hypoxia   Diastolic CHF, chronic   Pancreatic mass   Anemia   Wound of left leg   Leukocytosis, unspecified   Nonspecific (abnormal) findings on radiological and other examination of gastrointestinal tract   Preop cardiovascular  exam   ASSESSMENT AND PLAN: 79 yo female admitted to [facility] on 02/29/2012 with acute on chronic sigmoid diverticulitis and UTI and is being considered for abdominal surgery. Cardiology was asked to provide perioperative risk assessment from a cardiac standpoint. Full cardiology consult on 03/05/12.  1. History of myopericarditis with normal LV function by echo 2013: Repeat echo pending today.   2. Pre-operative cardiovascular examination: See full consult note from yesterday by Dr. Eden Emms. No contraindications from a cardiac perspective in regards to abdominal surgery. We will place an order for repeat echo as suggested yesterday but this should not delay her surgery and can be done today or prior to discharge.      Katie Galvan  3/5/20147:15 AM

## 2012-03-06 NOTE — Progress Notes (Signed)
PT Cancellation Note  Patient Details Name: Katie Galvan MRN: [id number] DOB: 1927-09-13   Cancelled Treatment:    Reason Eval/Treat Not Completed: Pain limiting ability to participate Pt reports not feeling well enough to perform mobility or participate today.  Encouraged pt to try to get to recliner later this afternoon with nursing.   LEMYRE,[name] 03/06/2012, 9:30 AM Zenovia Jarred, PT, DPT 03/06/2012 Pager: [phone]

## 2012-03-06 NOTE — Progress Notes (Signed)
Occupational Therapy Treatment Patient Details Name: Katie Galvan MRN: [id number] DOB: 1927/05/01 Today's Date: 03/06/2012 Time: 4098-1191 OT Time Calculation (min): 20 min  OT Assessment / Plan / Recommendation Comments on Treatment Session      Follow Up Recommendations   SNF    Barriers to Discharge       Equipment Recommendations   None recommended by OT    Recommendations for Other Services    Frequency  2x/week   Plan      Precautions / Restrictions Precautions Precautions: Fall Precaution Comments: O2 dependent Restrictions Weight Bearing Restrictions: No   Pertinent Vitals/Pain No pain but pt fatiqued    ADL  Grooming: Performed;Wash/dry hands;Wash/dry face;Brushing hair Where Assessed - Grooming: Unsupported sitting Upper Body Dressing: Performed;Minimal assistance (for lines) Where Assessed - Upper Body Dressing: Unsupported sitting Toilet Transfer: Performed;Minimal assistance Toilet Transfer Method: Stand pivot Acupuncturist: Bedside commode Toileting - Clothing Manipulation and Hygiene: Performed;Minimal assistance Where Assessed - Engineer, mining and Hygiene: Sit to stand from 3-in-1 or toilet ADL Comments: fatiques easily.  Did not want to attempt standing at sink    OT Diagnosis:    OT Problem List:   OT Treatment Interventions:     OT Goals Acute Rehab OT Goals Time For Goal Achievement: 03/15/12 ADL Goals Pt Will Perform Grooming: with min assist;Standing at sink ADL Goal: Grooming - Progress: Other (comment) (not toleratings) Pt Will Transfer to Toilet: with supervision;with DME;3-in-1 ADL Goal: Toilet Transfer - Progress: Progressing toward goals Pt Will Perform Toileting - Hygiene: with supervision;Sit to stand from 3-in-1/toilet ADL Goal: Toileting - Hygiene - Progress: Progressing toward goals Miscellaneous OT Goals Miscellaneous OT Goal #1: Pt will complete UB/LB adls with set up, min guard for sit to  stand taking rest breaks for energy conservation OT Goal: Miscellaneous Goal #1 - Progress:  (revised due to decreased activity tolerance)  Visit Information  Last OT Received On: 03/06/12 Assistance Needed: +1    Subjective Data      Prior Functioning       Cognition  Cognition Overall Cognitive Status: Appears within functional limits for tasks assessed/performed Arousal/Alertness: Awake/alert Orientation Level: Appears intact for tasks assessed Behavior During Session: [facility] for tasks performed    Mobility  Bed Mobility Supine to Sit: 3: Mod assist;HOB elevated;With rails Details for Bed Mobility Assistance: assist for trunk, verbal cues for technique Transfers Sit to Stand: 4: Min assist Details for Transfer Assistance: used rails from 3:1 to complete transfer    Exercises      Balance     End of Session OT - End of Session Activity Tolerance: Patient limited by fatigue Patient left: in bed;with bed alarm set;with call bell/phone within reach Nurse Communication:  (pt requests ice chips)  GO     SPENCER,MARYELLEN 03/06/2012, 8:47 AM [name], OTR/L [phone] 03/06/2012

## 2012-03-06 NOTE — Progress Notes (Signed)
PARENTERAL NUTRITION CONSULT NOTE - INITIAL  Pharmacy Consult for TNA Indication: Intolerance to enteral feedings, Diverticulitis vs colitis of sigmoid colon  Allergies  Allergen Reactions  . Sulfonamide Derivatives Swelling    Patient Measurements: Height: 5' (152.4 cm) Weight: 147 lb 11.3 oz (67 kg) IBW/kg (Calculated) : 45.5   Vital Signs: Temp: 98.1 F (36.7 C) (03/05 0540) Temp src: Oral (03/05 0540) BP: 133/66 mmHg (03/05 0540) Pulse Rate: 79 (03/05 0540) Intake/Output from previous day: 03/04 0701 - 03/05 0700 In: 776.7 [I.V.:316.7; IV Piggyback:100; TPN:360] Out: 1025 [Urine:1025] Intake/Output from this shift:    Labs:  Recent Labs  03/04/12 0435 03/05/12 0450 03/06/12 0451  WBC 15.5* 17.7* 15.5*  HGB 9.3* 8.8* 8.7*  HCT 29.8* 28.0* 28.0*  PLT 328 310 318     Recent Labs  03/04/12 0435 03/05/12 0450 03/06/12 0451  NA 138 134* 138  K 4.1 4.0 3.8  CL 103 100 101  CO2 28 27 30   GLUCOSE 92 111* 152*  BUN 12 10 11   CREATININE 0.94 0.88 0.84  CALCIUM 8.4 8.3* 8.3*  MG  --   --  1.7  PHOS  --   --  2.1*  PROT 5.3* 5.4* 5.6*  ALBUMIN 2.2* 2.3* 2.2*  AST 24 25 17   ALT 11 14 11   ALKPHOS 78 82 70  BILITOT 0.3 0.3 0.4   Estimated Creatinine Clearance: 44.1 ml/min (by C-G formula based on Cr of 0.84).    Recent Labs  03/05/12 2023 03/06/12 0033 03/06/12 0434  GLUCAP 137* 148* 146*    Medical History: Past Medical History  Diagnosis Date  . Disorders of diaphragm     right diaphragm elevation  . Unspecified essential hypertension   . Endocarditis, valve unspecified, unspecified cause   . Cardiac dysrhythmia, unspecified   . Unspecified venous (peripheral) insufficiency   . Edema   . Pure hypercholesterolemia   . Other dysphagia   . Diverticulosis of colon (without mention of hemorrhage)   . Benign neoplasm of colon   . Osteoarthrosis, unspecified whether generalized or localized, unspecified site   . Lumbago   . Chronic pain  syndrome   . Disorder of bone and cartilage, unspecified   . Depressive disorder, not elsewhere classified   . Anemia, unspecified   . Skin cancer   . Systolic heart failure     echo 10/10/10 EF 30%  . Hiatal hernia   . Gastritis   . COPD (chronic obstructive pulmonary disease)   . Lower extremity ulceration     dressing changes daily    Assessment:   33 yoF with acute on chronic sigmoiditis with possible chronic diverticulitis vs underlying malignancy, also with hx perimyocarditis now resolved.  CT 3/3 showed small area of air either contained within diverticulum or escaped from bowel.  Pt also on antibiotics for E.Coli UTI.  Surgery and cardiolgoy consulted for perioperative risk assessment.  No contraindications from cardiac perspective, ordered repeat echo.  PICC placed by IR and TNA initiated 3/4.  Pt states she is toleratingTNA fine although she would rather eat.  Awaiting surgery recommendations.     Pt reports usual body weight is 144 lbs and that she had a good appetite and was eating well PTA. Pt states that she usually eats 2 meals daily and drinks 1 Ensure supplement in the middle of the day.   Insulin Requirements in the past 24 hours:  3 units, CBGs 137-148  Nutritional Goals:  RD recs: Kcal: 1420-1660, Protein: 80-94  grams, Fluid: 2 L  Clinimix E5/20  at a goal rate of 46ml/hr + IVFE 20% at 7ml/hr on MWF to provide: 84g/day protein, 1684Kcal/day avg.(1478Kcal/day STTHS, 1958Kcal/day MWF).  Current Nutrition:  NPO Clinimix E5/20 @ 40 ml/hr  IVF:  None   Labs:  Glucose - slightly elevated  Electrolytes:  Phos 2.1, other lytes WNL, Corr Ca: 9.74  LFTs -WNL  TGs - 80 -ok  Prealbumin - Pending  Plan:  At 1800 today:  Increase Clinimix E5/20 to 64ml/hr.  Fat emulsion at 58ml/hr(MWF only due to ongoing shortage).  Plan to advance as tolerated to the goal rate.  Replete phosphorus with potassium phosphate (will also provide ~15 mEq  potassium).  TNA to contain standard multivitamins and trace elements(MWF only due to ongoing shortage).  Continue SSI and CBG checks q 4 hours   TNA lab panels on Mondays & Thursdays.  F/u daily.   Haynes Hoehn, PharmD 03/06/2012 7:16 AM  Pager: [phone]

## 2012-03-06 NOTE — Progress Notes (Signed)
[facility] PROGRESS NOTE  Katie Galvan ZOX:[id number] DOB: 05-24-1927 DOA: 02/29/2012 PCP: Michele Mcalpine, MD  Assessment/Plan:  From prior records: Sigmoiditis, acute on chronic. Had diverticulitis in same area in 2012, then 11/2011. She may have chronic divericulitis but is it also possible that she has an underlying malignancy. Her repeat CT from 3/3 demonstrated either a small area of air either contained within a diverticulum or escaped from the bowel. WBC continues to rise. She was seen by surgery on 3/3 and we are investigating her perioperative risk. I have called [facility] for who follows her for hx of perimyocarditis (now resolved) and now diastolic heart failure. Able to walk 1-2 blocks before dyspneic. Spoke with [name] who agrees that the patient would be moderate or high risk for surgery.  - NPO and TNA  - Seen by surgical team who has requested pulmonology evaluation for preop clearance. - Continue antiemetics - C. Diff neg  - Occult positive  - Lactoferrin positive  - Continue antibiotics currently on invanz - Appreciate GI and surgery recommendations  - Increase morphine dose and frequency (pt has previously been narcotic tolerant and dependent)  - Cardiology consult for perioperative risk assessment performed and deemed to have no contraindications from a cardiac perspective.  UTI, E. coli intermediate resistance to cipro.  - Patient completed 4 days of ceftriaxone - Urine culture grew out E coli sensitive to ceftriaxone  HYPERTENSION/HLD, chronic diastolic grade 1 /systolic (EF now 81-19%) CHF stable. BP mildly elevated. Weight is up >2kg.   - CXR shows no convincing pulmonary edema - BNP 9790 - continue prn labetalol for SBP > 160  - continue daily weights and strict I/O   Chronic respiratory failure with hypoxia due to COPD, restrictive lung disease from elevated right hemidiaphragm, stable.  - Continue 2L   - Continue symbicort and spiriva with  albuterol prn  - Incentive spirometry   Pancreatic mass -stable and being followed by GI.   Bilateral lower extremity ulcers, appear to be healing well  - Continue dressing changes daily   Leukocytosis, unspecified, likely related to UTI and sigmoiditis. Rising despite antibiotics possibly due to microperforation or underlying malignancy  - Trend, tx as above  - continue invanze.  Will discuss specialist recommendations with patient and family.  Iron deficiency anemia, hgb stable.  - Tx for hemoglobin < 7  - Iron studies sent by GI: Ferritin elevated likely due to inflammation, but iron level low.  - occult stool positive - GI on board   Code Status: DNR Family Communication: no family at bedside Disposition Plan: Pending further recommendations from surgeons on board.   Consultants:  Dr. Arlyce Dice ( GI)  Dr. Ezzard Standing (surgery)  Dr. Molli Knock (Pulm)  Procedures:  none  Antibiotics:  invanze  HPI/Subjective: No new complaints.  Patient is currently wanting to know what her risks are of complications from surgery should she require surgery.  Discussed with surgical team if surgery not option patient will require palliative care  Objective: Filed Vitals:   03/06/12 0500 03/06/12 0540 03/06/12 0813 03/06/12 1400  BP:  133/66  139/60  Pulse:  79  79  Temp:  98.1 F (36.7 C)  97.9 F (36.6 C)  TempSrc:  Oral  Oral  Resp:  20  20  Height:      Weight: 67 kg (147 lb 11.3 oz)     SpO2:  95% 96% 95%    Intake/Output Summary (Last 24 hours) at 03/06/12 1619 Last data filed  at 03/06/12 1532  Gross per 24 hour  Intake    820 ml  Output   1150 ml  Net   -330 ml   Filed Weights   03/04/12 0500 03/05/12 0500 03/06/12 0500  Weight: 66 kg (145 lb 8.1 oz) 66.7 kg (147 lb 0.8 oz) 67 kg (147 lb 11.3 oz)    Exam:   General:  Pt in NAD, Alert and Awake  Cardiovascular: RRR, No rubs  Respiratory: CTA BL, no wheezes, decreased Breath sounds at right lung base  Abdomen:  tenderness on palpation generalized, ND  Musculoskeletal: no cyanosis or clubbing   Data Reviewed: Basic Metabolic Panel:  Recent Labs Lab 03/02/12 0542 03/03/12 0505 03/04/12 0435 03/05/12 0450 03/06/12 0451  NA 142 137 138 134* 138  K 3.9 3.6 4.1 4.0 3.8  CL 105 102 103 100 101  CO2 29 28 28 27 30   GLUCOSE 95 162* 92 111* 152*  BUN 21 14 12 10 11   CREATININE 0.96 0.87 0.94 0.88 0.84  CALCIUM 8.8 8.5 8.4 8.3* 8.3*  MG  --   --   --   --  1.7  PHOS  --   --   --   --  2.1*   Liver Function Tests:  Recent Labs Lab 03/02/12 0542 03/03/12 0505 03/04/12 0435 03/05/12 0450 03/06/12 0451  AST 12 11 24 25 17   ALT 8 7 11 14 11   ALKPHOS 91 79 78 82 70  BILITOT 0.3 0.2* 0.3 0.3 0.4  PROT 6.2 5.4* 5.3* 5.4* 5.6*  ALBUMIN 2.3* 2.1* 2.2* 2.3* 2.2*   No results found for this basename: LIPASE, AMYLASE,  in the last 168 hours No results found for this basename: AMMONIA,  in the last 168 hours CBC:  Recent Labs Lab 02/29/12 1535  03/02/12 0542 03/03/12 0505 03/04/12 0435 03/05/12 0450 03/06/12 0451  WBC 11.8*  < > 11.4* 15.2* 15.5* 17.7* 15.5*  NEUTROABS 8.4*  --   --   --   --   --  12.4*  HGB 11.1*  < > 9.6* 8.9* 9.3* 8.8* 8.7*  HCT 34.2*  < > 31.3* 28.7* 29.8* 28.0* 28.0*  MCV 86.4  < > 87.4 87.2 87.4 85.9 85.6  PLT 446*  < > 354 358 328 310 318  < > = values in this interval not displayed. Cardiac Enzymes: No results found for this basename: CKTOTAL, CKMB, CKMBINDEX, TROPONINI,  in the last 168 hours BNP (last 3 results)  Recent Labs  11/02/11 0623 11/03/11 0628 03/05/12 0450  PROBNP 4539.0* 1925.0* 8790.0*   CBG:  Recent Labs Lab 03/05/12 2023 03/06/12 0033 03/06/12 0434 03/06/12 0759 03/06/12 1220  GLUCAP 137* 148* 146* 142* 177*    Recent Results (from the past 240 hour(s))  URINE CULTURE     Status: None   Collection Time    02/29/12  3:51 PM      Result Value Range Status   Specimen Description URINE, CLEAN CATCH   Final   Special  Requests NONE   Final   Culture  Setup Time 03/01/2012 01:39   Final   Colony Count >=100,000 COLONIES/ML   Final   Culture ESCHERICHIA COLI   Final   Report Status 03/02/2012 FINAL   Final   Organism ID, Bacteria ESCHERICHIA COLI   Final  CLOSTRIDIUM DIFFICILE BY PCR     Status: None   Collection Time    03/03/12  1:27 AM      Result Value Range  Status   C difficile by pcr NEGATIVE  NEGATIVE Final     Studies: Ct Abdomen Pelvis W Contrast  03/04/2012  *RADIOLOGY REPORT*  Clinical Data: Left lower quadrant abdominal pain, inflammatory process  CT ABDOMEN AND PELVIS WITH CONTRAST  Technique:  Multidetector CT imaging of the abdomen and pelvis was performed following the standard protocol during bolus administration of intravenous contrast. Sagittal and coronal MPR images reconstructed from axial data set.  Contrast: 80mL OMNIPAQUE IOHEXOL 300 MG/ML  SOLN Dilute oral contrast.  Comparison: 02/29/2012, 10/31/2011  Findings: Examination limited by patient motion artifacts as well as by beam hardening artifacts in pelvis from a right hip prosthesis. Right pleural effusion and basilar atelectasis. Free intraperitoneal fluid perihepatic and in pelvis. High position of gallbladder between liver and diaphragm. Colon interposition between liver and diaphragm.  Mild fatty infiltration of liver. Bilateral renal cortical atrophy with nonobstructing calculus lower pole left kidney image 35. Small bilateral renal cysts. Liver, spleen, atrophic pancreas, kidneys, and adrenal glands otherwise unremarkable.  Chronic sigmoid colon thickening over approximately 10 cm length, little changed from previous study, with associated infiltration of pericolic fat into sigmoid mesocolon. Numerous sigmoid diverticula noted. A focal gas collection 2 cm diameter adjacent to the posterior wall of the thickened sigmoid segment may represent a large diverticulum or a small contained perforation and is unchanged, axial image 62.  Air  within urinary bladder question catheterization. Dilated right inguinal canal containing fat. Stomach remaining bowel loops grossly unremarkable. Scattered atherosclerotic calcifications. No mass, adenopathy, hernia, or free intraperitoneal air. Numerous pelvic phleboliths. No free intraperitoneal air. Degenerative changes and scoliosis lumbar spine.  IMPRESSION: Persistent sigmoid wall thickening with pericolic inflammatory changes, question chronic diverticulitis versus inflammatory bowel disease in patient with diverticulosis. Underlying tumor not excluded; follow up colonoscopic assessment recommended.  Focal gas collection adjacent to the diseased segment of sigmoid colon may represent a large sigmoid diverticula though a small contained perforation not completely excluded, unchanged. Atrophic kidneys with bilateral cysts and nonobstructing left renal stone. Small amount of nonspecific free intraperitoneal fluid. Right inguinal hernia containing fat.   Original Report Authenticated By: Ulyses Southward, M.D.    Ir Fluoro Guide Cv Line Right  03/06/2012  * RADIOLOGY REPORT *  Clinical data: Pancreatic lesion  PICC PLACEMENT WITH ULTRASOUND AND FLUOROSCOPY  Technique: After written informed consent was obtained, patient was placed in the supine position on angiographic table. Patency of the right brachial vein was confirmed with ultrasound with image documentation. An appropriate skin site was determined. Skin site was marked. Region was prepped using maximum barrier technique including cap and mask, sterile gown, sterile gloves, large sterile sheet, and Chlorhexidine   as cutaneous antisepsis.  The region was infiltrated locally with 1% lidocaine.   Under real-time ultrasound guidance, the right brachial vein was accessed with a 21 gauge micropuncture needle; the needle tip within the vein was confirmed with ultrasound image documentation.   Needle exchanged over a 018 guidewire for a peel-away sheath, through which  a 5-French double- lumen  power injectable PICC trimmed to 34cm was advanced, positioned with its tip near the cavoatrial junction.  Spot chest radiograph confirms appropriate catheter position.  Catheter was flushed per protocol and secured externally with 0-Prolene sutures. The patient tolerated procedure well, with no immediate complication.  IMPRESSION: Technically successful five [place] double lumen power injectable PICC placement   Original Report Authenticated By: D. Andria Rhein, MD    Ir US Guide Vasc Access Right  03/06/2012  *  RADIOLOGY REPORT *  Clinical data: Pancreatic lesion  PICC PLACEMENT WITH ULTRASOUND AND FLUOROSCOPY  Technique: After written informed consent was obtained, patient was placed in the supine position on angiographic table. Patency of the right brachial vein was confirmed with ultrasound with image documentation. An appropriate skin site was determined. Skin site was marked. Region was prepped using maximum barrier technique including cap and mask, sterile gown, sterile gloves, large sterile sheet, and Chlorhexidine   as cutaneous antisepsis.  The region was infiltrated locally with 1% lidocaine.   Under real-time ultrasound guidance, the right brachial vein was accessed with a 21 gauge micropuncture needle; the needle tip within the vein was confirmed with ultrasound image documentation.   Needle exchanged over a 018 guidewire for a peel-away sheath, through which a 5-French double- lumen  power injectable PICC trimmed to 34cm was advanced, positioned with its tip near the cavoatrial junction.  Spot chest radiograph confirms appropriate catheter position.  Catheter was flushed per protocol and secured externally with 0-Prolene sutures. The patient tolerated procedure well, with no immediate complication.  IMPRESSION: Technically successful five [place] double lumen power injectable PICC placement   Original Report Authenticated By: D. Andria Rhein, MD    Dg Chest Port 1  View  03/05/2012  *RADIOLOGY REPORT*  Clinical Data: Shortness of breath  PORTABLE CHEST - 1 VIEW  Comparison: 11/15/11  Findings: Cardiomediastinal silhouette is stable.  Significant elevation of the right hemidiaphragm again noted.  Stable right basilar atelectasis.  No focal infiltrate or convincing pulmonary edema.  IMPRESSION: Significant elevation of the right hemidiaphragm again noted. Stable right basilar atelectasis.  No focal infiltrate or convincing pulmonary edema.   Original Report Authenticated By: Natasha Mead, M.D.     Scheduled Meds: . budesonide-formoterol  1 puff Inhalation BID  . ertapenem  1 g Intravenous Q24H  . heparin  5,000 Units Subcutaneous Q8H  . insulin aspart  0-9 Units Subcutaneous Q4H  . ondansetron (ZOFRAN) IV  8 mg Intravenous Q6H  . potassium phosphate IVPB (mmol)  10 mmol Intravenous Once  . sodium chloride  3 mL Intravenous Q12H  . tiotropium  18 mcg Inhalation Daily   Continuous Infusions: . fat emulsion    . TPN (CLINIMIX) +/- additives 40 mL/hr at 03/05/12 1828  . TPN (CLINIMIX) +/- additives      Principal Problem:   Diverticulitis Active Problems:   HYPERTENSION   UTI (lower urinary tract infection)   Chronic respiratory failure with hypoxia   Diastolic CHF, chronic   Pancreatic mass   Anemia   Wound of left leg   Leukocytosis, unspecified   Nonspecific (abnormal) findings on radiological and other examination of gastrointestinal tract   Preop cardiovascular exam    Time spent: > 35 minutes    Penny Pia  [facility] Pager [phone]. If 7PM-7AM, please contact night-coverage at [email or web address], password [facility] 03/06/2012, 4:19 PM  LOS: 6 days

## 2012-03-06 NOTE — Progress Notes (Signed)
Patient ID: Katie Galvan, female   DOB: [birth year], 79 y.o.   MRN: [id number]    Subjective: Unfortunately abd pain worse today, denies nausea or vomiting but definitely in more pain, pt would like to have a meeting with her son and MDs to discuss her options and risk, briefly discussed palliative care   Objective: Vital signs in last 24 hours: Temp:  [98.1 F (36.7 C)-98.7 F (37.1 C)] 98.1 F (36.7 C) (03/05 0540) Pulse Rate:  [77-81] 79 (03/05 0540) Resp:  [17-20] 20 (03/05 0540) BP: (133-153)/(62-81) 133/66 mmHg (03/05 0540) SpO2:  [95 %-98 %] 96 % (03/05 0813) Weight:  [147 lb 11.3 oz (67 kg)] 147 lb 11.3 oz (67 kg) (03/05 0500) Last BM Date: 03/04/12  Intake/Output from previous day: 03/04 0701 - 03/05 0700 In: 1136.7 [I.V.:316.7; IV Piggyback:300; TPN:520] Out: 1025 [Urine:1025] Intake/Output this shift: Total I/O In: -  Out: 150 [Urine:150]  PE: Abd: soft but much more tender today esp over left flank/LLQ, +bs General: awake, alert, On O2, appears chronically ill but not acute.  Lab Results:   Recent Labs  03/05/12 0450 03/06/12 0451  WBC 17.7* 15.5*  HGB 8.8* 8.7*  HCT 28.0* 28.0*  PLT 310 318   BMET  Recent Labs  03/05/12 0450 03/06/12 0451  NA 134* 138  K 4.0 3.8  CL 100 101  CO2 27 30  GLUCOSE 111* 152*  BUN 10 11  CREATININE 0.88 0.84  CALCIUM 8.3* 8.3*   PT/INR No results found for this basename: LABPROT, INR,  in the last 72 hours CMP     Component Value Date/Time   NA 138 03/06/2012 0451   NA 139 11/27/2011 1533   K 3.8 03/06/2012 0451   K 4.1 11/27/2011 1533   CL 101 03/06/2012 0451   CL 100 11/27/2011 1533   CO2 30 03/06/2012 0451   CO2 31* 11/27/2011 1533   GLUCOSE 152* 03/06/2012 0451   GLUCOSE 97 11/27/2011 1533   BUN 11 03/06/2012 0451   BUN 22.0 11/27/2011 1533   CREATININE 0.84 03/06/2012 0451   CREATININE 0.9 11/27/2011 1533   CALCIUM 8.3* 03/06/2012 0451   CALCIUM 10.0 11/27/2011 1533   PROT 5.6* 03/06/2012 0451   PROT 7.1  11/27/2011 1533   ALBUMIN 2.2* 03/06/2012 0451   ALBUMIN 3.4* 11/27/2011 1533   AST 17 03/06/2012 0451   AST 16 11/27/2011 1533   ALT 11 03/06/2012 0451   ALT 11 11/27/2011 1533   ALKPHOS 70 03/06/2012 0451   ALKPHOS 73 11/27/2011 1533   BILITOT 0.4 03/06/2012 0451   BILITOT 0.34 11/27/2011 1533   GFRNONAA 63* 03/06/2012 0451   GFRAA 73* 03/06/2012 0451   Lipase     Component Value Date/Time   LIPASE 8* 11/08/2011 1539       Studies/Results: Ct Abdomen Pelvis W Contrast  03/04/2012  *RADIOLOGY REPORT*  Clinical Data: Left lower quadrant abdominal pain, inflammatory process  CT ABDOMEN AND PELVIS WITH CONTRAST  Technique:  Multidetector CT imaging of the abdomen and pelvis was performed following the standard protocol during bolus administration of intravenous contrast. Sagittal and coronal MPR images reconstructed from axial data set.  Contrast: 80mL OMNIPAQUE IOHEXOL 300 MG/ML  SOLN Dilute oral contrast.  Comparison: 02/29/2012, 10/31/2011  Findings: Examination limited by patient motion artifacts as well as by beam hardening artifacts in pelvis from a right hip prosthesis. Right pleural effusion and basilar atelectasis. Free intraperitoneal fluid perihepatic and in pelvis. High position of gallbladder between liver and  diaphragm. Colon interposition between liver and diaphragm.  Mild fatty infiltration of liver. Bilateral renal cortical atrophy with nonobstructing calculus lower pole left kidney image 35. Small bilateral renal cysts. Liver, spleen, atrophic pancreas, kidneys, and adrenal glands otherwise unremarkable.  Chronic sigmoid colon thickening over approximately 10 cm length, little changed from previous study, with associated infiltration of pericolic fat into sigmoid mesocolon. Numerous sigmoid diverticula noted. A focal gas collection 2 cm diameter adjacent to the posterior wall of the thickened sigmoid segment may represent a large diverticulum or a small contained perforation and is  unchanged, axial image 62.  Air within urinary bladder question catheterization. Dilated right inguinal canal containing fat. Stomach remaining bowel loops grossly unremarkable. Scattered atherosclerotic calcifications. No mass, adenopathy, hernia, or free intraperitoneal air. Numerous pelvic phleboliths. No free intraperitoneal air. Degenerative changes and scoliosis lumbar spine.  IMPRESSION: Persistent sigmoid wall thickening with pericolic inflammatory changes, question chronic diverticulitis versus inflammatory bowel disease in patient with diverticulosis. Underlying tumor not excluded; follow up colonoscopic assessment recommended.  Focal gas collection adjacent to the diseased segment of sigmoid colon may represent a large sigmoid diverticula though a small contained perforation not completely excluded, unchanged. Atrophic kidneys with bilateral cysts and nonobstructing left renal stone. Small amount of nonspecific free intraperitoneal fluid. Right inguinal hernia containing fat.   Original Report Authenticated By: Ulyses Southward, M.D.    Dg Chest Port 1 View  03/05/2012  *RADIOLOGY REPORT*  Clinical Data: Shortness of breath  PORTABLE CHEST - 1 VIEW  Comparison: 11/15/11  Findings: Cardiomediastinal silhouette is stable.  Significant elevation of the right hemidiaphragm again noted.  Stable right basilar atelectasis.  No focal infiltrate or convincing pulmonary edema.  IMPRESSION: Significant elevation of the right hemidiaphragm again noted. Stable right basilar atelectasis.  No focal infiltrate or convincing pulmonary edema.   Original Report Authenticated By: Natasha Mead, M.D.     Anti-infectives: Anti-infectives   Start     Dose/Rate Route Frequency Ordered Stop   03/03/12 1200  cefTRIAXone (ROCEPHIN) 1 g in dextrose 5 % 50 mL IVPB     1 g 100 mL/hr over 30 Minutes Intravenous Every 24 hours 03/03/12 0958     02/29/12 2000  metroNIDAZOLE (FLAGYL) IVPB 500 mg     500 mg 100 mL/hr over 60 Minutes  Intravenous Every 8 hours 02/29/12 1919     02/29/12 2000  ciprofloxacin (CIPRO) IVPB 400 mg  Status:  Discontinued     400 mg 200 mL/hr over 60 Minutes Intravenous Every 12 hours 02/29/12 1925 03/03/12 0958   02/29/12 1745  cefTRIAXone (ROCEPHIN) 1 g in dextrose 5 % 50 mL IVPB     1 g 100 mL/hr over 30 Minutes Intravenous  Once 02/29/12 1744 02/29/12 1859   02/29/12 1745  metroNIDAZOLE (FLAGYL) IVPB 500 mg  Status:  Discontinued     500 mg 100 mL/hr over 60 Minutes Intravenous  Once 02/29/12 1744 02/29/12 1929   02/29/12 1745  ciprofloxacin (CIPRO) IVPB 400 mg  Status:  Discontinued     400 mg 200 mL/hr over 60 Minutes Intravenous  Once 02/29/12 1744 02/29/12 1928       Assessment/Plan 1. Diverticulitis vs colitis of sigmoid colon: CT scan - 03/04/2012 - chronic sigmoid colon thickening over about 10 cm (similar to CT scan 03/01/2011 and may be a little worse than 10/31/2011), unfortunately abd pain worsening now, WBC elevated but stable overall, cards eval noted and no contraindication from their standpoint to surgery, still need pulmonary evaluation and  likely would benefit from palliative evaluation to help with options for patient if she is not a candidate for surgery  --cont NPO for right now and start TNA  --await pulmonary evaluation for relative risk for surgery  --will likely need a family/patient/physician meeting to discuss options.    LOS: 6 days    WHITE, ELIZABETH 03/06/2012

## 2012-03-06 NOTE — Progress Notes (Signed)
  Echocardiogram 2D Echocardiogram has been performed.  MORFORD, MELISSA 03/06/2012, 12:15 PM

## 2012-03-06 NOTE — Progress Notes (Signed)
[facility] Progress Note  SUBJECTIVE: She felt better yesterday. Her abdominal pain is worse today  OBJECTIVE:  Vital signs in last 24 hours: Temp:  [98.1 F (36.7 C)-98.7 F (37.1 C)] 98.1 F (36.7 C) (03/05 0540) Pulse Rate:  [77-81] 79 (03/05 0540) Resp:  [17-20] 20 (03/05 0540) BP: (133-153)/(62-81) 133/66 mmHg (03/05 0540) SpO2:  [95 %-98 %] 96 % (03/05 0813) Weight:  [147 lb 11.3 oz (67 kg)] 147 lb 11.3 oz (67 kg) (03/05 0500) Last BM Date: 03/04/12 General:    white female in NAD Heart:  Regular rate and rhythm; no murmurs Lungs: Respirations even and unlabored, lungs CTA bilaterally Abdomen:  Soft, and nondistended. Normal bowel sounds. There is moderate tenderness in the left lower quadrant Extremities:  Without edema. Neurologic:  Alert and oriented,  grossly normal neurologically. Psych:  Cooperative. Normal mood and affect.  ILab Results:  Recent Labs  03/04/12 0435 03/05/12 0450 03/06/12 0451  WBC 15.5* 17.7* 15.5*  HGB 9.3* 8.8* 8.7*  HCT 29.8* 28.0* 28.0*  PLT 328 310 318   BMET  Recent Labs  03/04/12 0435 03/05/12 0450 03/06/12 0451  NA 138 134* 138  K 4.1 4.0 3.8  CL 103 100 101  CO2 28 27 30   GLUCOSE 92 111* 152*  BUN 12 10 11   CREATININE 0.94 0.88 0.84  CALCIUM 8.4 8.3* 8.3*   LFT  Recent Labs  03/06/12 0451  PROT 5.6*  ALBUMIN 2.2*  AST 17  ALT 11  ALKPHOS 70  BILITOT 0.4   Studies/Results: Ct Abdomen Pelvis W Contrast  03/04/2012  *RADIOLOGY REPORT*  Clinical Data: Left lower quadrant abdominal pain, inflammatory process  CT ABDOMEN AND PELVIS WITH CONTRAST  Technique:  Multidetector CT imaging of the abdomen and pelvis was performed following the standard protocol during bolus administration of intravenous contrast. Sagittal and coronal MPR images reconstructed from axial data set.  Contrast: 80mL OMNIPAQUE IOHEXOL 300 MG/ML  SOLN Dilute oral contrast.  Comparison: 02/29/2012, 10/31/2011  Findings: Examination  limited by patient motion artifacts as well as by beam hardening artifacts in pelvis from a right hip prosthesis. Right pleural effusion and basilar atelectasis. Free intraperitoneal fluid perihepatic and in pelvis. High position of gallbladder between liver and diaphragm. Colon interposition between liver and diaphragm.  Mild fatty infiltration of liver. Bilateral renal cortical atrophy with nonobstructing calculus lower pole left kidney image 35. Small bilateral renal cysts. Liver, spleen, atrophic pancreas, kidneys, and adrenal glands otherwise unremarkable.  Chronic sigmoid colon thickening over approximately 10 cm length, little changed from previous study, with associated infiltration of pericolic fat into sigmoid mesocolon. Numerous sigmoid diverticula noted. A focal gas collection 2 cm diameter adjacent to the posterior wall of the thickened sigmoid segment may represent a large diverticulum or a small contained perforation and is unchanged, axial image 62.  Air within urinary bladder question catheterization. Dilated right inguinal canal containing fat. Stomach remaining bowel loops grossly unremarkable. Scattered atherosclerotic calcifications. No mass, adenopathy, hernia, or free intraperitoneal air. Numerous pelvic phleboliths. No free intraperitoneal air. Degenerative changes and scoliosis lumbar spine.  IMPRESSION: Persistent sigmoid wall thickening with pericolic inflammatory changes, question chronic diverticulitis versus inflammatory bowel disease in patient with diverticulosis. Underlying tumor not excluded; follow up colonoscopic assessment recommended.  Focal gas collection adjacent to the diseased segment of sigmoid colon may represent a large sigmoid diverticula though a small contained perforation not completely excluded, unchanged. Atrophic kidneys with bilateral cysts and nonobstructing left renal stone. Small amount of nonspecific free  intraperitoneal fluid. Right inguinal hernia containing  fat.   Original Report Authenticated By: Ulyses Southward, M.D.    Dg Chest Port 1 View  03/05/2012  *RADIOLOGY REPORT*  Clinical Data: Shortness of breath  PORTABLE CHEST - 1 VIEW  Comparison: 11/15/11  Findings: Cardiomediastinal silhouette is stable.  Significant elevation of the right hemidiaphragm again noted.  Stable right basilar atelectasis.  No focal infiltrate or convincing pulmonary edema.  IMPRESSION: Significant elevation of the right hemidiaphragm again noted. Stable right basilar atelectasis.  No focal infiltrate or convincing pulmonary edema.   Original Report Authenticated By: Natasha Mead, M.D.      ASSESSMENT / PLAN: 1. LLQ pain, presumably secondary to sigmoid diverticulitis. Repeat CTscan two days ago reveals persistent sigmoid wall thickening / pericolic inflammatory changes, question chronic diverticulitis vs inflammatory bowel disease,vs neoplasm. Focal gas collection adjacent to the diseased segment of sigmoid colon may be large sigmoid diverticula but a small contained perforation not complete. Patient still having abdominal pain. She is on day #6 of IV Flagyl. Cipro changed to Rocephin yesterday. Cardiology has weighed in on surgical risks. Her chronic lung disease seems to be the main concern. Patient on PICC, getting TNA. Surgery to discuss options with patient and family.   2. UTI, on Rocephin    3. Multiple medical problems as listed in PMH     LOS: 6 days   Willette Cluster  03/06/2012, 9:22 AM  I have personally taken an interval history, reviewed the chart, and examined the patient.  I agree with the extender's note, impression and recommendations.  Barbette Hair. Arlyce Dice, MD, [facility] [phone]

## 2012-03-06 NOTE — Consult Note (Signed)
PULMONARY  / CRITICAL CARE MEDICINE  Name: Katie Galvan MRN: [id number] DOB: May 31, 1927    ADMISSION DATE:  02/29/2012 CONSULTATION DATE:  3/5  REFERRING MD :  Surgery  CHIEF COMPLAINT:  Pulmonary clearance for surgery.  BRIEF PATIENT DESCRIPTION:   79 yo WF never smoker, O2 dependent x 3 years, [name] PCP,  Who needs general surgery for suspected perforated bowel from diverticulitis  with bowel resection and /or colostomy. PCCM asked to evaluate for pulmonary clearance.  SIGNIFICANT EVENTS / STUDIES:    LINES / TUBES:   CULTURES:   ANTIBIOTICS: 3/5 invanz>>  HISTORY OF PRESENT ILLNESS:    78 yo WF never smoker, O2 dependent x 3 years, [name] PCP,  Who needs general surgery for suspected perforated bowel from diverticulitis  with bowel resection and /or colostomy. Despite being 79 yo and on O2 she is ablw to ambulate >1/4 mile on a good day. No nocturnal dyspnea or orthopnea.   PAST MEDICAL HISTORY :  Past Medical History  Diagnosis Date  . Disorders of diaphragm     right diaphragm elevation  . Unspecified essential hypertension   . Endocarditis, valve unspecified, unspecified cause   . Cardiac dysrhythmia, unspecified   . Unspecified venous (peripheral) insufficiency   . Edema   . Pure hypercholesterolemia   . Other dysphagia   . Diverticulosis of colon (without mention of hemorrhage)   . Benign neoplasm of colon   . Osteoarthrosis, unspecified whether generalized or localized, unspecified site   . Lumbago   . Chronic pain syndrome   . Disorder of bone and cartilage, unspecified   . Depressive disorder, not elsewhere classified   . Anemia, unspecified   . Skin cancer   . Systolic heart failure     echo 10/10/10 EF 30%  . Hiatal hernia   . Gastritis   . COPD (chronic obstructive pulmonary disease)   . Lower extremity ulceration     dressing changes daily   Past Surgical History  Procedure Laterality Date  . Appendectomy    . Breast surgery       biopsy  . Left wrist surgery    . Bilateral cataracts    . Hip surgery      dr. Rayburn Ma, twice because the bones were spongy  . Tonsillectomy    . Adenoidectomy     Prior to Admission medications   Medication Sig Start Date End Date Taking? Authorizing Alline Pio  aspirin 81 MG EC tablet Take 81 mg by mouth every morning.    Yes Historical Diontre Harps, MD  bisoprolol (ZEBETA) 5 MG tablet Take 2.5 mg by mouth every morning.  01/01/12  Yes Amy D Clegg, NP  budesonide-formoterol (SYMBICORT) 160-4.5 MCG/ACT inhaler Inhale 1 puff into the lungs 2 (two) times daily.   Yes Historical Fawnda Vitullo, MD  Calcium Carbonate-Vit D-Min (CALTRATE 600+D PLUS) 600-400 MG-UNIT per tablet Chew 1 tablet by mouth 2 (two) times daily.    Yes Historical Orel Hord, MD  cholecalciferol (VITAMIN D) 1000 UNITS tablet Take 2,000 Units by mouth every morning.    Yes Historical Chinaza Rooke, MD  citalopram (CELEXA) 20 MG tablet Take 20 mg by mouth every morning. 10/09/11  Yes Michele Mcalpine, MD  ferrous sulfate 325 (65 FE) MG tablet Take 325 mg by mouth 2 (two) times daily.     Yes Historical Julane Crock, MD  furosemide (LASIX) 40 MG tablet Take 40 mg by mouth every morning. 12/14/11  Yes Amy D Clegg, NP  lisinopril (PRINIVIL,ZESTRIL) 5 MG  tablet Take 2.5 mg by mouth every morning.  11/23/11  Yes Michele Mcalpine, MD  loratadine (CLARITIN) 10 MG tablet Take 10 mg by mouth every morning.    Yes Historical Casimiro Lienhard, MD  morphine (MS CONTIN) 15 MG 12 hr tablet Take 15 mg by mouth 2 (two) times daily.   Yes Historical Markitta Ausburn, MD  morphine (MSIR) 15 MG tablet Take 15 mg by mouth every 8 (eight) hours as needed for pain. Pain.   Yes Historical Emaan Gary, MD  Multiple Vitamin (MULTIVITAMIN WITH MINERALS) TABS Take 1 tablet by mouth every morning.    Yes Historical Mariusz Jubb, MD  Naproxen Sodium (ALEVE) 220 MG CAPS Take 1 capsule by mouth 2 (two) times daily.   Yes Historical Rilan Eiland, MD  ondansetron (ZOFRAN-ODT) 4 MG disintegrating tablet Take  4 mg by mouth every 8 (eight) hours as needed for nausea.   Yes Historical Gwendoline Judy, MD  Probiotic Product (ALIGN) 4 MG CAPS Take 1 capsule by mouth every morning.   Yes Historical Burkley Dech, MD  spironolactone (ALDACTONE) 25 MG tablet Take 12.5 mg by mouth every morning. 01/01/12  Yes Amy D Clegg, NP  tiotropium (SPIRIVA) 18 MCG inhalation capsule Place 18 mcg into inhaler and inhale 2 (two) times daily.   Yes Historical Icelyn Navarrete, MD  vitamin C (ASCORBIC ACID) 500 MG tablet Take 500 mg by mouth 2 (two) times daily.    Yes Historical Budd Freiermuth, MD  zinc gluconate 50 MG tablet Take 100 mg by mouth every morning.   Yes Historical Janaisha Tolsma, MD   Allergies  Allergen Reactions  . Sulfonamide Derivatives Swelling    FAMILY HISTORY:  Family History  Problem Relation Age of Onset  . Diabetes Mother   . Heart disease Father   . Lung cancer Sister   . Alcohol abuse Sister   . Alcohol abuse Brother    SOCIAL HISTORY:  reports that she has never smoked. She has never used smokeless tobacco. She reports that she does not drink alcohol or use illicit drugs.  REVIEW OF SYSTEMS:   10 point review of system taken, please see HPI for positives and negatives.   SUBJECTIVE:  NAD @ rest VITAL SIGNS: Temp:  [98.1 F (36.7 C)-98.7 F (37.1 C)] 98.1 F (36.7 C) (03/05 0540) Pulse Rate:  [77-81] 79 (03/05 0540) Resp:  [17-20] 20 (03/05 0540) BP: (133-153)/(62-81) 133/66 mmHg (03/05 0540) SpO2:  [95 %-98 %] 96 % (03/05 0813) Weight:  [67 kg (147 lb 11.3 oz)] 67 kg (147 lb 11.3 oz) (03/05 0500)  PHYSICAL EXAMINATION: General:  Frail but mentally sharp Neuro:  Intact HEENT:  No LAN Neck:  No JVD Cardiovascular:  HSR RRR Lungs:  Decreased bs bases Abdomen:  Tender , on tna Musculoskeletal:  Lt wrist deformed from surgery Skin:  Warm with mild le edema   Recent Labs Lab 03/04/12 0435 03/05/12 0450 03/06/12 0451  NA 138 134* 138  K 4.1 4.0 3.8  CL 103 100 101  CO2 28 27 30   BUN 12 10 11    CREATININE 0.94 0.88 0.84  GLUCOSE 92 111* 152*    Recent Labs Lab 03/04/12 0435 03/05/12 0450 03/06/12 0451  HGB 9.3* 8.8* 8.7*  HCT 29.8* 28.0* 28.0*  WBC 15.5* 17.7* 15.5*  PLT 328 310 318   Ct Abdomen Pelvis W Contrast  03/04/2012  *RADIOLOGY REPORT*  Clinical Data: Left lower quadrant abdominal pain, inflammatory process  CT ABDOMEN AND PELVIS WITH CONTRAST  Technique:  Multidetector CT imaging of the abdomen and  pelvis was performed following the standard protocol during bolus administration of intravenous contrast. Sagittal and coronal MPR images reconstructed from axial data set.  Contrast: 80mL OMNIPAQUE IOHEXOL 300 MG/ML  SOLN Dilute oral contrast.  Comparison: 02/29/2012, 10/31/2011  Findings: Examination limited by patient motion artifacts as well as by beam hardening artifacts in pelvis from a right hip prosthesis. Right pleural effusion and basilar atelectasis. Free intraperitoneal fluid perihepatic and in pelvis. High position of gallbladder between liver and diaphragm. Colon interposition between liver and diaphragm.  Mild fatty infiltration of liver. Bilateral renal cortical atrophy with nonobstructing calculus lower pole left kidney image 35. Small bilateral renal cysts. Liver, spleen, atrophic pancreas, kidneys, and adrenal glands otherwise unremarkable.  Chronic sigmoid colon thickening over approximately 10 cm length, little changed from previous study, with associated infiltration of pericolic fat into sigmoid mesocolon. Numerous sigmoid diverticula noted. A focal gas collection 2 cm diameter adjacent to the posterior wall of the thickened sigmoid segment may represent a large diverticulum or a small contained perforation and is unchanged, axial image 62.  Air within urinary bladder question catheterization. Dilated right inguinal canal containing fat. Stomach remaining bowel loops grossly unremarkable. Scattered atherosclerotic calcifications. No mass, adenopathy, hernia, or  free intraperitoneal air. Numerous pelvic phleboliths. No free intraperitoneal air. Degenerative changes and scoliosis lumbar spine.  IMPRESSION: Persistent sigmoid wall thickening with pericolic inflammatory changes, question chronic diverticulitis versus inflammatory bowel disease in patient with diverticulosis. Underlying tumor not excluded; follow up colonoscopic assessment recommended.  Focal gas collection adjacent to the diseased segment of sigmoid colon may represent a large sigmoid diverticula though a small contained perforation not completely excluded, unchanged. Atrophic kidneys with bilateral cysts and nonobstructing left renal stone. Small amount of nonspecific free intraperitoneal fluid. Right inguinal hernia containing fat.   Original Report Authenticated By: Ulyses Southward, M.D.    Dg Chest Port 1 View  03/05/2012  *RADIOLOGY REPORT*  Clinical Data: Shortness of breath  PORTABLE CHEST - 1 VIEW  Comparison: 11/15/11  Findings: Cardiomediastinal silhouette is stable.  Significant elevation of the right hemidiaphragm again noted.  Stable right basilar atelectasis.  No focal infiltrate or convincing pulmonary edema.  IMPRESSION: Significant elevation of the right hemidiaphragm again noted. Stable right basilar atelectasis.  No focal infiltrate or convincing pulmonary edema.   Original Report Authenticated By: Natasha Mead, M.D.     ASSESSMENT / PLAN: Pulmonary clearance for abdominal surgery for presumed perforation. Her pulmonary status does not preclude surgery but does place her at higher risk for post operative pulmonary complications that includes inability to liberate from ventilator. She appears willing to take this risk but does not want prolonged vent support and tracheostomy. Vent LTAC is not an option per her request. PCCM will follow along if she has the surgery.  Thank you.  Brett Canales Minor ACNP Adolph Pollack PCCM Pager [phone] till 3 pm If no answer page [phone] 03/06/2012, 11:57  AM  Patient is a high risk surgical patient from a pulmonary standpoint, however, given the surgical need I would recommend proceeding with the surgery if the patient so well inclines.  We spoke extensively however about life support.  The patient is currently a full NCB.  We discussed that there is a chance that with major abdominal surgeries there is a chance that patient might not be able to get of the ventilator.  When that was discussed, she informed us that she has a living will and would not want to be on life support for a prolonged  period and not trach/peg.  Given that, we recommended that she speaks with her children concerning that but in my opinion, best chance at survival here would be to proceed with surgery and tackle the idea of liberation from life support once patient is out of the OR.  Patient seen and examined, agree with above note.  I dictated the care and orders written for this patient under my direction.  Alyson Reedy, MD [phone]

## 2012-03-07 DIAGNOSIS — I10 Essential (primary) hypertension: Secondary | ICD-10-CM

## 2012-03-07 DIAGNOSIS — G894 Chronic pain syndrome: Secondary | ICD-10-CM

## 2012-03-07 LAB — GLUCOSE, CAPILLARY
Glucose-Capillary: 142 mg/dL — ABNORMAL HIGH (ref 70–99)
Glucose-Capillary: 145 mg/dL — ABNORMAL HIGH (ref 70–99)
Glucose-Capillary: 112 mg/dL — ABNORMAL HIGH (ref 70–99)

## 2012-03-07 LAB — COMPREHENSIVE METABOLIC PANEL
Albumin: 2.3 g/dL — ABNORMAL LOW (ref 3.5–5.2)
BUN: 11 mg/dL (ref 6–23)
Chloride: 99 mEq/L (ref 96–112)
Creatinine, Ser: 0.78 mg/dL (ref 0.50–1.10)
GFR calc non Af Amer: 76 mL/min — ABNORMAL LOW (ref >90)
Total Bilirubin: 0.3 mg/dL (ref 0.3–1.2)

## 2012-03-07 LAB — CBC
MCV: 85.5 fL (ref 78.0–100.0)
Platelets: 318 10*3/uL (ref 150–400)
RBC: 3.24 MIL/uL — ABNORMAL LOW (ref 3.87–5.11)
WBC: 15.3 10*3/uL — ABNORMAL HIGH (ref 4.0–10.5)

## 2012-03-07 LAB — MAGNESIUM: Magnesium: 1.9 mg/dL (ref 1.5–2.5)

## 2012-03-07 MED ORDER — CLINIMIX E/DEXTROSE (5/20) 5 % IV SOLN
INTRAVENOUS | Status: AC
  Administered 2012-03-07: 17:00:00 via INTRAVENOUS
  Filled 2012-03-07: qty 2000

## 2012-03-07 MED ORDER — BIOTENE DRY MOUTH MT LIQD
15.0000 mL | Freq: Two times a day (BID) | OROMUCOSAL | Status: DC
  Administered 2012-03-07 – 2012-03-27 (×35): 15 mL via OROMUCOSAL

## 2012-03-07 MED ORDER — CHLORHEXIDINE GLUCONATE 0.12 % MT SOLN
15.0000 mL | Freq: Two times a day (BID) | OROMUCOSAL | Status: DC
  Administered 2012-03-08 – 2012-03-26 (×32): 15 mL via OROMUCOSAL
  Filled 2012-03-07 (×44): qty 15

## 2012-03-07 NOTE — Progress Notes (Signed)
Patient ID: Katie Galvan, female   DOB: [birth year], 79 y.o.   MRN: [id number]   Echo reviewed, normal EF with moderate to severe MR.  I do not think that the MR prohibits surgery.  She will need long-term followup with cardiology to address this.   [name] 03/07/2012 7:21 AM

## 2012-03-07 NOTE — Progress Notes (Signed)
CSW continues to follow for snf at [facility].  Bethany C. Corcoran MSW, LCSW [phone]

## 2012-03-07 NOTE — Progress Notes (Signed)
Physical Therapy Treatment Patient Details Name: Katie Galvan MRN: [id number] DOB: 05/21/1927 Today's Date: 03/07/2012 Time: 4098-1191 PT Time Calculation (min): 30 min  PT Assessment / Plan / Recommendation Comments on Treatment Session  Pt ambulated 25 feet in hallway today!  However limited due to fatigue and BM in hallway so assisted back to room in recliner and used BSC before returning to supine.    Follow Up Recommendations  SNF     Does the patient have the potential to tolerate intense rehabilitation     Barriers to Discharge        Equipment Recommendations  None recommended by PT    Recommendations for Other Services    Frequency     Plan Discharge plan remains appropriate;Frequency remains appropriate    Precautions / Restrictions Precautions Precautions: Fall Precaution Comments: O2 dependent   Pertinent Vitals/Pain n/a    Mobility  Bed Mobility Bed Mobility: Supine to Sit Supine to Sit: 3: Mod assist;HOB elevated;With rails Sit to Supine: 4: Min guard;Other (comment) (LEs onto bed) Details for Bed Mobility Assistance: assist for trunk, verbal cues for technique Transfers Transfers: Stand to Sit;Sit to Stand;Stand Pivot Transfers Sit to Stand: 4: Min assist;From bed;From chair/3-in-1 Stand to Sit: 4: Min assist;To chair/3-in-1;To bed Stand Pivot Transfers: 4: Min assist Details for Transfer Assistance: pt declined RW for stand pivot to [facility] after BM in hallway with ambulation, assist to steady and used 2 HHA for pivot to [facility], otherwise verbal cues for hand placement and assist to rise and control descent Ambulation/Gait Ambulation/Gait Assistance: 4: Min assist Ambulation Distance (Feet): 25 Feet Assistive device: Rolling walker Ambulation/Gait Assistance Details: verbal cues for use of RW, ambulated on 2L O2 [place], pt requested recliner due to fatigue, so pt rested in recliner however upon standing to ambulate back to room pt had to sit back down due to  BM Gait Pattern: Step-to pattern;Trunk flexed General Gait Details: SaO2 98% on 2L O2, HR 82    Exercises     PT Diagnosis:    PT Problem List:   PT Treatment Interventions:     PT Goals Acute Rehab PT Goals PT Goal: Supine/Side to Sit - Progress: Partly met PT Goal: Sit to Stand - Progress: Progressing toward goal PT Goal: Ambulate - Progress: Progressing toward goal  Visit Information  Last PT Received On: 03/07/12 Assistance Needed: +2 (if ambulating)    Subjective Data  Subjective: Well if you say I did good then I guess I did (with ambulation)   Cognition  Cognition Overall Cognitive Status: Appears within functional limits for tasks assessed/performed Arousal/Alertness: Awake/alert Orientation Level: Appears intact for tasks assessed Behavior During Session: [facility] for tasks performed    Balance     End of Session PT - End of Session Equipment Utilized During Treatment: Oxygen Activity Tolerance: Patient limited by fatigue Patient left: in bed;with call bell/phone within reach   GP     Katie Galvan 03/07/2012, 3:30 PM Zenovia Jarred, PT, DPT 03/07/2012 Pager: [phone]

## 2012-03-07 NOTE — Progress Notes (Signed)
Patient ID: Katie Galvan, female   DOB: [birth year], 79 y.o.   MRN: [id number] [facility] Progress Note  Subjective:feels better overall and definitely better than yesterday. No nausea, haivng some bm;s. Breathing is at baseline. Antibiotic changed to Invanz yesterday  Objective:  Vital signs in last 24 hours: Temp:  [97.9 F (36.6 C)-98.3 F (36.8 C)] 98.3 F (36.8 C) (03/06 0429) Pulse Rate:  [73-79] 73 (03/06 0429) Resp:  [18-20] 18 (03/06 0429) BP: (137-140)/(55-60) 137/55 mmHg (03/06 0429) SpO2:  [91 %-99 %] 97 % (03/06 0743) Weight:  [145 lb 4.5 oz (65.9 kg)] 145 lb 4.5 oz (65.9 kg) (03/06 0429) Last BM Date: 03/07/12 General:   Alert,  Well-developed, elderly wf    in NAD Heart:  Regular rate and rhythm;4/6 systolic murmur Pulm;clear ant  Abdomen:  Soft,tender LLQ and LMG with guarding, no rebound, Bs+.   Extremities:  Without edema. Neurologic:  Alert and  oriented x4;  grossly normal neurologically. Psych:  Alert and cooperative. Normal mood and affect.  Intake/Output from previous day: 03/05 0701 - 03/06 0700 In: 1774 [I.V.:386.3; IV Piggyback:150; TPN:1237.7] Out: 2376 [Urine:2375; Stool:1] Intake/Output this shift: Total I/O In: -  Out: 200 [Urine:200]  Lab Results:  Recent Labs  03/05/12 0450 03/06/12 0451 03/07/12 0354  WBC 17.7* 15.5* 15.3*  HGB 8.8* 8.7* 8.9*  HCT 28.0* 28.0* 27.7*  PLT 310 318 318   BMET  Recent Labs  03/05/12 0450 03/06/12 0451 03/07/12 0354  NA 134* 138 136  K 4.0 3.8 3.8  CL 100 101 99  CO2 27 30 32  GLUCOSE 111* 152* 116*  BUN 10 11 11   CREATININE 0.88 0.84 0.78  CALCIUM 8.3* 8.3* 8.6   LFT  Recent Labs  03/07/12 0354  PROT 5.4*  ALBUMIN 2.3*  AST 15  ALT 10  ALKPHOS 62  BILITOT 0.3      Assessment / Plan: #1  79 yo female with acute severe diverticulitis with probable contained perforation. Day #7 hospital stay. She is stable, and now making some progress. Poor operative  candidate Plan is to continue IV Invanz-day #2, continue bowel rest and TNA.    Cbc in am Probably repeat imaging in a few days #2 Chronic resp failure #3 CHF Principal Problem:   Diverticulitis Active Problems:   HYPERTENSION   UTI (lower urinary tract infection)   Chronic respiratory failure with hypoxia   Diastolic CHF, chronic   Pancreatic mass   Anemia   Wound of left leg   Leukocytosis, unspecified   Nonspecific (abnormal) findings on radiological and other examination of gastrointestinal tract   Preop cardiovascular exam     LOS: 7 days   Amy Esterwood  03/07/2012, 10:34 AM  I have personally taken an interval history, reviewed the chart, and examined the patient.  I agree with the extender's note, impression and recommendations.  Barbette Hair. Arlyce Dice, MD, [facility] [phone]

## 2012-03-07 NOTE — Progress Notes (Signed)
[facility] PROGRESS NOTE  Katie Galvan ZOX:[id number] DOB: Apr 26, 1927 DOA: 02/29/2012 PCP: Michele Mcalpine, MD  Assessment/Plan:  From prior records: Sigmoiditis, acute on chronic. Had diverticulitis in same area in 2012, then 11/2011. She may have chronic divericulitis but is it also possible that she has an underlying malignancy. Her repeat CT from 3/3 demonstrated either a small area of air either contained within a diverticulum or escaped from the bowel. WBC continues to rise. She was seen by surgery on 3/3 and we are investigating her perioperative risk. I have called [facility] for who follows her for hx of perimyocarditis (now resolved) and now diastolic heart failure. Able to walk 1-2 blocks before dyspneic. Spoke with [name] who agrees that the patient would be moderate or high risk for surgery.  - NPO and TNA  - Seen by surgical team who has requested pulmonology evaluation for preop clearance. - Pulmonary team has cleared as high risk - Continue antiemetics - C. Diff neg  - Occult positive  - Lactoferrin positive  - Continue antibiotics currently on invanz GI on board and managing  - Continue pain control - Cardiology consult for perioperative risk assessment performed and deemed to have no contraindications from a cardiac perspective. - Patient wishes to wait and see if antibiotics and current medical management will improve her condition although she has no ruled out an operation.  UTI, E. coli intermediate resistance to cipro.  - Patient completed 4 days of ceftriaxone - Urine culture grew out E coli sensitive to ceftriaxone  HYPERTENSION/HLD, chronic diastolic grade 1 /systolic (EF now 81-19%) CHF stable. - CXR shows no convincing pulmonary edema - BNP 9790 - continue prn labetalol for SBP > 160  - continue daily weights and strict I/O   Chronic respiratory failure with hypoxia due to COPD, restrictive lung disease from elevated right hemidiaphragm, stable.   - Continue 2L [place]  - Continue symbicort and spiriva with albuterol prn  - Incentive spirometry   Pancreatic mass -stable and being followed by GI.   Bilateral lower extremity ulcers, appear to be healing well  - Continue dressing changes daily   Leukocytosis, unspecified, likely related to UTI and sigmoiditis. Rising despite antibiotics possibly due to microperforation or underlying malignancy  - Trend, tx as above  - continue invanze.  Will discuss specialist recommendations with patient and family.  Iron deficiency anemia, hgb stable.  - Tx for hemoglobin < 7  - Iron studies sent by GI: Ferritin elevated likely due to inflammation, but iron level low.  - occult stool positive - GI on board   Code Status: DNR Family Communication: no family at bedside Disposition Plan: Pending further recommendations from surgeons on board.   Consultants:  Dr. Arlyce Dice ( GI)  Dr. Ezzard Standing (surgery)  Dr. Molli Knock (Pulm)  Procedures:  none  Antibiotics:  invanze  HPI/Subjective: No new complaints at this point.  No acute issues reported overnight.  Objective: Filed Vitals:   03/06/12 2304 03/07/12 0429 03/07/12 0743 03/07/12 1405  BP: 140/60 137/55  135/73  Pulse: 75 73  72  Temp: 98.2 F (36.8 C) 98.3 F (36.8 C)  97.2 F (36.2 C)  TempSrc: Oral Axillary  Axillary  Resp: 20 18  20   Height:      Weight:  65.9 kg (145 lb 4.5 oz)    SpO2: 99% 99% 97% 98%    Intake/Output Summary (Last 24 hours) at 03/07/12 1457 Last data filed at 03/07/12 1407  Gross per 24 hour  Intake   1774 ml  Output   2276 ml  Net   -502 ml   Filed Weights   03/05/12 0500 03/06/12 0500 03/07/12 0429  Weight: 66.7 kg (147 lb 0.8 oz) 67 kg (147 lb 11.3 oz) 65.9 kg (145 lb 4.5 oz)    Exam:   General:  Pt in NAD, Alert and Awake  Cardiovascular: RRR, No rubs  Respiratory: CTA BL, no wheezes, decreased Breath sounds at right lung base  Abdomen: tenderness on palpation generalized,  ND  Musculoskeletal: no cyanosis or clubbing   Data Reviewed: Basic Metabolic Panel:  Recent Labs Lab 03/03/12 0505 03/04/12 0435 03/05/12 0450 03/06/12 0451 03/07/12 0354  NA 137 138 134* 138 136  K 3.6 4.1 4.0 3.8 3.8  CL 102 103 100 101 99  CO2 28 28 27 30  32  GLUCOSE 162* 92 111* 152* 116*  BUN 14 12 10 11 11   CREATININE 0.87 0.94 0.88 0.84 0.78  CALCIUM 8.5 8.4 8.3* 8.3* 8.6  MG  --   --   --  1.7 1.9  PHOS  --   --   --  2.1* 2.7   Liver Function Tests:  Recent Labs Lab 03/03/12 0505 03/04/12 0435 03/05/12 0450 03/06/12 0451 03/07/12 0354  AST 11 24 25 17 15   ALT 7 11 14 11 10   ALKPHOS 79 78 82 70 62  BILITOT 0.2* 0.3 0.3 0.4 0.3  PROT 5.4* 5.3* 5.4* 5.6* 5.4*  ALBUMIN 2.1* 2.2* 2.3* 2.2* 2.3*   No results found for this basename: LIPASE, AMYLASE,  in the last 168 hours No results found for this basename: AMMONIA,  in the last 168 hours CBC:  Recent Labs Lab 02/29/12 1535  03/03/12 0505 03/04/12 0435 03/05/12 0450 03/06/12 0451 03/07/12 0354  WBC 11.8*  < > 15.2* 15.5* 17.7* 15.5* 15.3*  NEUTROABS 8.4*  --   --   --   --  12.4*  --   HGB 11.1*  < > 8.9* 9.3* 8.8* 8.7* 8.9*  HCT 34.2*  < > 28.7* 29.8* 28.0* 28.0* 27.7*  MCV 86.4  < > 87.2 87.4 85.9 85.6 85.5  PLT 446*  < > 358 328 310 318 318  < > = values in this interval not displayed. Cardiac Enzymes: No results found for this basename: CKTOTAL, CKMB, CKMBINDEX, TROPONINI,  in the last 168 hours BNP (last 3 results)  Recent Labs  11/02/11 0623 11/03/11 0628 03/05/12 0450  PROBNP 4539.0* 1925.0* 8790.0*   CBG:  Recent Labs Lab 03/06/12 2009 03/07/12 0023 03/07/12 0405 03/07/12 0725 03/07/12 1203  GLUCAP 139* 142* 116* 142* 145*    Recent Results (from the past 240 hour(s))  URINE CULTURE     Status: None   Collection Time    02/29/12  3:51 PM      Result Value Range Status   Specimen Description URINE, CLEAN CATCH   Final   Special Requests NONE   Final   Culture  Setup  Time 03/01/2012 01:39   Final   Colony Count >=100,000 COLONIES/ML   Final   Culture ESCHERICHIA COLI   Final   Report Status 03/02/2012 FINAL   Final   Organism ID, Bacteria ESCHERICHIA COLI   Final  CLOSTRIDIUM DIFFICILE BY PCR     Status: None   Collection Time    03/03/12  1:27 AM      Result Value Range Status   C difficile by pcr NEGATIVE  NEGATIVE Final  Studies: Ir Fluoro Guide Cv Line Right  03/06/2012  * RADIOLOGY REPORT *  Clinical data: Pancreatic lesion  PICC PLACEMENT WITH ULTRASOUND AND FLUOROSCOPY  Technique: After written informed consent was obtained, patient was placed in the supine position on angiographic table. Patency of the right brachial vein was confirmed with ultrasound with image documentation. An appropriate skin site was determined. Skin site was marked. Region was prepped using maximum barrier technique including cap and mask, sterile gown, sterile gloves, large sterile sheet, and Chlorhexidine   as cutaneous antisepsis.  The region was infiltrated locally with 1% lidocaine.   Under real-time ultrasound guidance, the right brachial vein was accessed with a 21 gauge micropuncture needle; the needle tip within the vein was confirmed with ultrasound image documentation.   Needle exchanged over a 018 guidewire for a peel-away sheath, through which a 5-French double- lumen  power injectable PICC trimmed to 34cm was advanced, positioned with its tip near the cavoatrial junction.  Spot chest radiograph confirms appropriate catheter position.  Catheter was flushed per protocol and secured externally with 0-Prolene sutures. The patient tolerated procedure well, with no immediate complication.  IMPRESSION: Technically successful five [place] double lumen power injectable PICC placement   Original Report Authenticated By: D. Andria Rhein, MD    Ir US Guide Vasc Access Right  03/06/2012  * RADIOLOGY REPORT *  Clinical data: Pancreatic lesion  PICC PLACEMENT WITH ULTRASOUND AND  FLUOROSCOPY  Technique: After written informed consent was obtained, patient was placed in the supine position on angiographic table. Patency of the right brachial vein was confirmed with ultrasound with image documentation. An appropriate skin site was determined. Skin site was marked. Region was prepped using maximum barrier technique including cap and mask, sterile gown, sterile gloves, large sterile sheet, and Chlorhexidine   as cutaneous antisepsis.  The region was infiltrated locally with 1% lidocaine.   Under real-time ultrasound guidance, the right brachial vein was accessed with a 21 gauge micropuncture needle; the needle tip within the vein was confirmed with ultrasound image documentation.   Needle exchanged over a 018 guidewire for a peel-away sheath, through which a 5-French double- lumen  power injectable PICC trimmed to 34cm was advanced, positioned with its tip near the cavoatrial junction.  Spot chest radiograph confirms appropriate catheter position.  Catheter was flushed per protocol and secured externally with 0-Prolene sutures. The patient tolerated procedure well, with no immediate complication.  IMPRESSION: Technically successful five [place] double lumen power injectable PICC placement   Original Report Authenticated By: D. Andria Rhein, MD     Scheduled Meds: . antiseptic oral rinse  15 mL Mouth Rinse q12n4p  . budesonide-formoterol  1 puff Inhalation BID  . chlorhexidine  15 mL Mouth Rinse BID  . ertapenem  1 g Intravenous Q24H  . heparin  5,000 Units Subcutaneous Q8H  . insulin aspart  0-9 Units Subcutaneous Q4H  . ondansetron (ZOFRAN) IV  8 mg Intravenous Q6H  . sodium chloride  3 mL Intravenous Q12H  . tiotropium  18 mcg Inhalation Daily   Continuous Infusions: . sodium chloride 20 mL/hr at 03/06/12 1955  . sodium chloride 20 mL/hr at 03/06/12 2122  . fat emulsion 250 mL (03/06/12 1805)  . TPN (CLINIMIX) +/- additives 55 mL/hr at 03/06/12 1804  . TPN (CLINIMIX) +/-  additives      Principal Problem:   Diverticulitis Active Problems:   HYPERTENSION   UTI (lower urinary tract infection)   Chronic respiratory failure with hypoxia  Diastolic CHF, chronic   Pancreatic mass   Anemia   Wound of left leg   Leukocytosis, unspecified   Nonspecific (abnormal) findings on radiological and other examination of gastrointestinal tract   Preop cardiovascular exam    Time spent: > 35 minutes    Penny Pia  [facility] Pager [phone]. If 7PM-7AM, please contact night-coverage at [email or web address], password [facility] 03/07/2012, 2:57 PM  LOS: 7 days

## 2012-03-07 NOTE — Progress Notes (Signed)
PARENTERAL NUTRITION CONSULT NOTE - FOLLOW UP  Pharmacy Consult for TNA Indication: intolerance to enteral feeding (Diverticulitis vs colitis of sigmoid colon, awaiting surgery)   Allergies  Allergen Reactions  . Sulfonamide Derivatives Swelling    Patient Measurements: Height: 5' (152.4 cm) Weight: 145 lb 4.5 oz (65.9 kg) IBW/kg (Calculated) : 45.5 Usual Weight: 65 kg  Vital Signs: Temp: 98.3 F (36.8 C) (03/06 0429) Temp src: Axillary (03/06 0429) BP: 137/55 mmHg (03/06 0429) Pulse Rate: 73 (03/06 0429) Intake/Output from previous day: 03/05 0701 - 03/06 0700 In: 1774 [I.V.:386.3; IV Piggyback:150; TPN:1237.7] Out: 2376 [Urine:2375; Stool:1] Intake/Output from this shift:    Labs:  Recent Labs  03/05/12 0450 03/06/12 0451 03/07/12 0354  WBC 17.7* 15.5* 15.3*  HGB 8.8* 8.7* 8.9*  HCT 28.0* 28.0* 27.7*  PLT 310 318 318     Recent Labs  03/05/12 0450 03/06/12 0451 03/07/12 0354  NA 134* 138 136  K 4.0 3.8 3.8  CL 100 101 99  CO2 27 30 32  GLUCOSE 111* 152* 116*  BUN 10 11 11   CREATININE 0.88 0.84 0.78  CALCIUM 8.3* 8.3* 8.6  MG  --  1.7 1.9  PHOS  --  2.1* 2.7  PROT 5.4* 5.6* 5.4*  ALBUMIN 2.3* 2.2* 2.3*  AST 25 17 15   ALT 14 11 10   ALKPHOS 82 70 62  BILITOT 0.3 0.4 0.3  PREALBUMIN  --  7.9*  --   TRIG  --  80  --   CHOL  --  91  --    Estimated Creatinine Clearance: 46 ml/min (by C-G formula based on Cr of 0.78).    Recent Labs  03/07/12 0023 03/07/12 0405 03/07/12 0725  GLUCAP 142* 116* 142*   Assessment:  84 yom with h/o diveriticulitis presented 2/27 with abd pain, N/V and fatigue. CT abd/pelvis in ED with worsened chronic inflammatory process involving the sigmoid colon. Pt admitted for acute on chronic sigmoiditis, diverticulitis, and UTI. GI on board, pt did not improve with Cipro/Flagyl. Repeat CT 3/3 with chronic sigmoid colon thickening, small area of air either contained within diverticulum or escaped from bowel. Surgery,  cardiology and pulmonary consulted and investigating perioperative risk.  TNA ordered to start 3/4 given intolerance to enteral feeding.  - 3/6: Today is TNA day#3.  Cardiology and pulmonary cleared for surgery if needed.  Abx changed to Ertapenem. Awaiting final decision for surgery.    Nutritional Goals:  - RD recs per day: 1420-1660 Kcal, 80-94 g protein, 2L fluid.  -Clinimix E5/20 at 40ml/hr + IVFE 20% at 3ml/hr on MWF to provide: 84g/day protein, 1684Kcal/day (Avg 1478Kcal/day STTHS, 1958Kcal/day MWF).  Current nutrition:  - Diet: NPO since 3/3 - TNA: Clinimix E 5/20 @ 55 ml/hr - mIVF: NS at 20 ml/hr  CBGs & Insulin requirements past 24 hours:  - CBGs < 150, required 6 units of SSI   Labs: Electrolytes: K+, Mag, Phos wnl Renal Function: Scr wnl, I/O 1774/2374cc (net -602cc) Hepatic Function: wnl  Pre-Albumin: 7.9 (3/5)  Triglycerides: 80 (3/5) CBGs: controlled on SSI q6h   Plan:    Advance Clinimix E 5/20 to goal rate of 70 ml/hr.   TNA to contain IV fat emulsion 20% at 10 ml/hr on MWF only due to ongoing shortage  Standard multivitamins and trace elements on MWF only due to ongoing shortage  TNA labs Monday/Thursdays  Pharmacy will follow up daily  Geoffry Paradise, PharmD, BCPS Pager: [phone] 9:06 AM Pharmacy #: [id number]

## 2012-03-07 NOTE — Progress Notes (Signed)
Patient ID: Katie Galvan, female   DOB: 1927-08-29, 79 y.o.   MRN: [id number]    Subjective: Better today, less pain, denies n/v  Objective: Vital signs in last 24 hours: Temp:  [97.9 F (36.6 C)-98.3 F (36.8 C)] 98.3 F (36.8 C) (03/06 0429) Pulse Rate:  [73-79] 73 (03/06 0429) Resp:  [18-20] 18 (03/06 0429) BP: (137-140)/(55-60) 137/55 mmHg (03/06 0429) SpO2:  [91 %-99 %] 97 % (03/06 0743) Weight:  [145 lb 4.5 oz (65.9 kg)] 145 lb 4.5 oz (65.9 kg) (03/06 0429) Last BM Date: 03/07/12  Intake/Output from previous day: 03/05 0701 - 03/06 0700 In: 1774 [I.V.:386.3; IV Piggyback:150; TPN:1237.7] Out: 2376 [Urine:2375; Stool:1] Intake/Output this shift:    PE: Abd: soft and less tender today +bs General: awake, alert, On O2, appears chronically ill but not acute.  Lab Results:   Recent Labs  03/06/12 0451 03/07/12 0354  WBC 15.5* 15.3*  HGB 8.7* 8.9*  HCT 28.0* 27.7*  PLT 318 318   BMET  Recent Labs  03/06/12 0451 03/07/12 0354  NA 138 136  K 3.8 3.8  CL 101 99  CO2 30 32  GLUCOSE 152* 116*  BUN 11 11  CREATININE 0.84 0.78  CALCIUM 8.3* 8.6   PT/INR No results found for this basename: LABPROT, INR,  in the last 72 hours CMP     Component Value Date/Time   NA 136 03/07/2012 0354   NA 139 11/27/2011 1533   K 3.8 03/07/2012 0354   K 4.1 11/27/2011 1533   CL 99 03/07/2012 0354   CL 100 11/27/2011 1533   CO2 32 03/07/2012 0354   CO2 31* 11/27/2011 1533   GLUCOSE 116* 03/07/2012 0354   GLUCOSE 97 11/27/2011 1533   BUN 11 03/07/2012 0354   BUN 22.0 11/27/2011 1533   CREATININE 0.78 03/07/2012 0354   CREATININE 0.9 11/27/2011 1533   CALCIUM 8.6 03/07/2012 0354   CALCIUM 10.0 11/27/2011 1533   PROT 5.4* 03/07/2012 0354   PROT 7.1 11/27/2011 1533   ALBUMIN 2.3* 03/07/2012 0354   ALBUMIN 3.4* 11/27/2011 1533   AST 15 03/07/2012 0354   AST 16 11/27/2011 1533   ALT 10 03/07/2012 0354   ALT 11 11/27/2011 1533   ALKPHOS 62 03/07/2012 0354   ALKPHOS 73 11/27/2011 1533   BILITOT 0.3 03/07/2012 0354   BILITOT 0.34 11/27/2011 1533   GFRNONAA 76* 03/07/2012 0354   GFRAA 88* 03/07/2012 0354   Lipase     Component Value Date/Time   LIPASE 8* 11/08/2011 1539       Studies/Results: Ir Fluoro Guide Cv Line Right  03/06/2012  * RADIOLOGY REPORT *  Clinical data: Pancreatic lesion  PICC PLACEMENT WITH ULTRASOUND AND FLUOROSCOPY  Technique: After written informed consent was obtained, patient was placed in the supine position on angiographic table. Patency of the right brachial vein was confirmed with ultrasound with image documentation. An appropriate skin site was determined. Skin site was marked. Region was prepped using maximum barrier technique including cap and mask, sterile gown, sterile gloves, large sterile sheet, and Chlorhexidine   as cutaneous antisepsis.  The region was infiltrated locally with 1% lidocaine.   Under real-time ultrasound guidance, the right brachial vein was accessed with a 21 gauge micropuncture needle; the needle tip within the vein was confirmed with ultrasound image documentation.   Needle exchanged over a 018 guidewire for a peel-away sheath, through which a 5-French double- lumen  power injectable PICC trimmed to 34cm was advanced, positioned with its  tip near the cavoatrial junction.  Spot chest radiograph confirms appropriate catheter position.  Catheter was flushed per protocol and secured externally with 0-Prolene sutures. The patient tolerated procedure well, with no immediate complication.  IMPRESSION: Technically successful five [place] double lumen power injectable PICC placement   Original Report Authenticated By: D. Andria Rhein, MD    Ir US Guide Vasc Access Right  03/06/2012  * RADIOLOGY REPORT *  Clinical data: Pancreatic lesion  PICC PLACEMENT WITH ULTRASOUND AND FLUOROSCOPY  Technique: After written informed consent was obtained, patient was placed in the supine position on angiographic table. Patency of the right brachial vein was  confirmed with ultrasound with image documentation. An appropriate skin site was determined. Skin site was marked. Region was prepped using maximum barrier technique including cap and mask, sterile gown, sterile gloves, large sterile sheet, and Chlorhexidine   as cutaneous antisepsis.  The region was infiltrated locally with 1% lidocaine.   Under real-time ultrasound guidance, the right brachial vein was accessed with a 21 gauge micropuncture needle; the needle tip within the vein was confirmed with ultrasound image documentation.   Needle exchanged over a 018 guidewire for a peel-away sheath, through which a 5-French double- lumen  power injectable PICC trimmed to 34cm was advanced, positioned with its tip near the cavoatrial junction.  Spot chest radiograph confirms appropriate catheter position.  Catheter was flushed per protocol and secured externally with 0-Prolene sutures. The patient tolerated procedure well, with no immediate complication.  IMPRESSION: Technically successful five [place] double lumen power injectable PICC placement   Original Report Authenticated By: D. Andria Rhein, MD    Dg Chest Port 1 View  03/05/2012  *RADIOLOGY REPORT*  Clinical Data: Shortness of breath  PORTABLE CHEST - 1 VIEW  Comparison: 11/15/11  Findings: Cardiomediastinal silhouette is stable.  Significant elevation of the right hemidiaphragm again noted.  Stable right basilar atelectasis.  No focal infiltrate or convincing pulmonary edema.  IMPRESSION: Significant elevation of the right hemidiaphragm again noted. Stable right basilar atelectasis.  No focal infiltrate or convincing pulmonary edema.   Original Report Authenticated By: Natasha Mead, M.D.     Anti-infectives: Anti-infectives   Start     Dose/Rate Route Frequency Ordered Stop   03/06/12 1200  ertapenem (INVANZ) 1 g in sodium chloride 0.9 % 50 mL IVPB     1 g 100 mL/hr over 30 Minutes Intravenous Every 24 hours 03/06/12 1059     03/03/12 1200  cefTRIAXone  (ROCEPHIN) 1 g in dextrose 5 % 50 mL IVPB  Status:  Discontinued     1 g 100 mL/hr over 30 Minutes Intravenous Every 24 hours 03/03/12 0958 03/06/12 1059   02/29/12 2000  metroNIDAZOLE (FLAGYL) IVPB 500 mg  Status:  Discontinued     500 mg 100 mL/hr over 60 Minutes Intravenous Every 8 hours 02/29/12 1919 03/06/12 1059   02/29/12 2000  ciprofloxacin (CIPRO) IVPB 400 mg  Status:  Discontinued     400 mg 200 mL/hr over 60 Minutes Intravenous Every 12 hours 02/29/12 1925 03/03/12 0958   02/29/12 1745  cefTRIAXone (ROCEPHIN) 1 g in dextrose 5 % 50 mL IVPB     1 g 100 mL/hr over 30 Minutes Intravenous  Once 02/29/12 1744 02/29/12 1859   02/29/12 1745  metroNIDAZOLE (FLAGYL) IVPB 500 mg  Status:  Discontinued     500 mg 100 mL/hr over 60 Minutes Intravenous  Once 02/29/12 1744 02/29/12 1929   02/29/12 1745  ciprofloxacin (CIPRO) IVPB 400 mg  Status:  Discontinued     400 mg 200 mL/hr over 60 Minutes Intravenous  Once 02/29/12 1744 02/29/12 1928       Assessment/Plan 1. Diverticulitis vs colitis of sigmoid colon: better today, Dr. Donell Beers spoke with patient and her son yesterday, please see her note, no changes from surgery standpoint today, continue conservative management with bowel rest and iv abx and tna, pulm and cards have cleared for surgery if needed and patient will outline her wishes for after surgery.  --cont NPO for right now and start TNA  --IV abx    LOS: 7 days    WHITE, ELIZABETH 03/07/2012

## 2012-03-08 DIAGNOSIS — N390 Urinary tract infection, site not specified: Secondary | ICD-10-CM

## 2012-03-08 DIAGNOSIS — K869 Disease of pancreas, unspecified: Secondary | ICD-10-CM

## 2012-03-08 LAB — COMPREHENSIVE METABOLIC PANEL
ALT: 8 U/L (ref 0–35)
AST: 11 U/L (ref 0–37)
Calcium: 8.6 mg/dL (ref 8.4–10.5)
GFR calc Af Amer: 89 mL/min — ABNORMAL LOW (ref >90)
Sodium: 134 mEq/L — ABNORMAL LOW (ref 135–145)
Total Protein: 5 g/dL — ABNORMAL LOW (ref 6.0–8.3)

## 2012-03-08 LAB — CBC
HCT: 25.7 % — ABNORMAL LOW (ref 36.0–46.0)
Hemoglobin: 8.2 g/dL — ABNORMAL LOW (ref 12.0–15.0)
MCH: 27.3 pg (ref 26.0–34.0)
MCHC: 31.9 g/dL (ref 30.0–36.0)

## 2012-03-08 LAB — GLUCOSE, CAPILLARY
Glucose-Capillary: 147 mg/dL — ABNORMAL HIGH (ref 70–99)
Glucose-Capillary: 127 mg/dL — ABNORMAL HIGH (ref 70–99)

## 2012-03-08 MED ORDER — FAT EMULSION 20 % IV EMUL
240.0000 mL | INTRAVENOUS | Status: AC
  Administered 2012-03-08: 240 mL via INTRAVENOUS
  Filled 2012-03-08: qty 250

## 2012-03-08 MED ORDER — TRACE MINERALS CR-CU-F-FE-I-MN-MO-SE-ZN IV SOLN
INTRAVENOUS | Status: AC
  Administered 2012-03-08: 17:00:00 via INTRAVENOUS
  Filled 2012-03-08: qty 2000

## 2012-03-08 NOTE — Progress Notes (Signed)
[facility] Progress Note  Subjective: *In the last 2 hours she is complaining of increased abdominal pain.**  Objective:  Vital signs in last 24 hours: Temp:  [97.2 F (36.2 C)-98.1 F (36.7 C)] 97.6 F (36.4 C) (03/07 0547) Pulse Rate:  [72-89] 87 (03/07 0547) Resp:  [18-22] 22 (03/07 0547) BP: (134-149)/(60-82) 149/82 mmHg (03/07 0547) SpO2:  [93 %-100 %] 94 % (03/07 0848) Weight:  [143 lb 8.3 oz (65.1 kg)] 143 lb 8.3 oz (65.1 kg) (03/07 0500) Last BM Date: 03/07/12 General:   Alert,  Well-developed,  white female in NAD Heart:  Regular rate and rhythm; no murmurs Abdomen:  Soft, nondistended. Normal bowel sounds, without guarding, and without rebound.  Mild tenderness in the left lower quadrant Extremities:  Without edema. Neurologic:  Alert and  oriented x4;  grossly normal neurologically. Psych:  Alert and cooperative. Normal mood and affect.  Intake/Output from previous day: 03/06 0701 - 03/07 0700 In: 2652.5 [I.V.:948; IV Piggyback:100; TPN:1604.5] Out: 2130 [Urine:2130] Intake/Output this shift:    Lab Results:  Recent Labs  03/06/12 0451 03/07/12 0354 03/08/12 0535  WBC 15.5* 15.3* 13.9*  HGB 8.7* 8.9* 8.2*  HCT 28.0* 27.7* 25.7*  PLT 318 318 348   BMET  Recent Labs  03/06/12 0451 03/07/12 0354 03/08/12 0535  NA 138 136 134*  K 3.8 3.8 4.5  CL 101 99 98  CO2 30 32 33*  GLUCOSE 152* 116* 129*  BUN 11 11 16   CREATININE 0.84 0.78 0.75  CALCIUM 8.3* 8.6 8.6   LFT  Recent Labs  03/08/12 0535  PROT 5.0*  ALBUMIN 2.0*  AST 11  ALT 8  ALKPHOS 53  BILITOT 0.3   PT/INR No results found for this basename: LABPROT, INR,  in the last 72 hours Hepatitis Panel No results found for this basename: HEPBSAG, HCVAB, HEPAIGM, HEPBIGM,  in the last 72 hours  Studies/Results: No results found.   Assessment / Plan: *Acute diverticulitis.  Patient has made very slow progress, if any, on conservative management including bowel rest and  antibiotics. Surgery is actively following.   Principal Problem:   Diverticulitis Active Problems:   HYPERTENSION   UTI (lower urinary tract infection)   Chronic respiratory failure with hypoxia   Diastolic CHF, chronic   Pancreatic mass   Anemia   Wound of left leg   Leukocytosis, unspecified   Nonspecific (abnormal) findings on radiological and other examination of gastrointestinal tract   Preop cardiovascular exam     LOS: 8 days   Katie Galvan  03/08/2012, 12:27 PM

## 2012-03-08 NOTE — Progress Notes (Signed)
[facility] PROGRESS NOTE  Katie Galvan WUJ:[id number] DOB: 1927-01-19 DOA: 02/29/2012 PCP: Katie Mcalpine, MD  Assessment/Plan:  From prior records: Sigmoiditis, acute on chronic. Had diverticulitis in same area in 2012, then 11/2011. She may have chronic divericulitis but is it also possible that she has an underlying malignancy. Her repeat CT from 3/3 demonstrated either a small area of air either contained within a diverticulum or escaped from the bowel. WBC continues to rise. She was seen by surgery on 3/3 and we are investigating her perioperative risk. I have called [facility] for who follows her for hx of perimyocarditis (now resolved) and now diastolic heart failure. Able to walk 1-2 blocks before dyspneic. Spoke with [name] who agrees that the patient would be moderate or high risk for surgery.  - NPO and TNA  - Seen by surgical team who has requested pulmonology evaluation for preop clearance. - Pulmonary team has cleared as high risk - Continue antiemetics - C. Diff neg  - Occult positive  - Lactoferrin positive  - Continue antibiotics currently on invanz GI on board and managing  - Continue pain control - Cardiology consult for perioperative risk assessment performed and deemed to have no contraindications from a cardiac perspective. - Patient wishes to wait and see if antibiotics and current medical management will improve her condition although she has not ruled out an operation. - Surgery on board and awaiting final decision from patient.  UTI, E. coli intermediate resistance to cipro.  - Patient completed 4 days of ceftriaxone - Urine culture grew out E coli sensitive to ceftriaxone  HYPERTENSION/HLD, chronic diastolic grade 1 /systolic (EF now 95-62%) CHF stable. 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  - Continue 2L [place]  - Continue symbicort and spiriva with albuterol prn  - Incentive spirometry   Pancreatic mass -stable and being followed by GI.   Bilateral lower extremity ulcers, appear to be healing well  - Continue dressing changes daily   Leukocytosis, unspecified, likely related to UTI and sigmoiditis. Rising despite antibiotics possibly due to microperforation or underlying malignancy  - Trend, tx as above  - continue invanze (today is day 3).   Iron deficiency anemia, hgb stable.  - Tx for hemoglobin < 7  - Iron studies sent by GI: Ferritin elevated likely due to inflammation, but iron level low.  - occult stool positive - GI on board   Code Status: DNR Family Communication: no family at bedside Disposition Plan: Pending further recommendations from surgeons on board.   Consultants:  Dr. Arlyce Dice ( GI)  Dr. Ezzard Standing (surgery)  Dr. Molli Knock (Pulm)  Procedures:  none  Antibiotics:  invanze  HPI/Subjective: Patient has had some more abdominal discomfort today.  Would like to avoid surgery but is open to it should her condition worsen.  Objective: Filed Vitals:   03/08/12 0500 03/08/12 0547 03/08/12 0848 03/08/12 1419  BP: 142/74 149/82  131/66  Pulse: 89 87  80  Temp: 98.1 F (36.7 C) 97.6 F (36.4 C)  98.2 F (36.8 C)  TempSrc: Oral Oral  Axillary  Resp: 18 22  19   Height:      Weight: 65.1 kg (143 lb 8.3 oz)     SpO2: 96% 93% 94% 96%    Intake/Output Summary (Last  24 hours) at 03/08/12 1545 Last data filed at 03/08/12 0700  Gross per 24 hour  Intake   1739 ml  Output   1680 ml  Net     59 ml   Filed Weights   03/06/12 0500 03/07/12 0429 03/08/12 0500  Weight: 67 kg (147 lb 11.3 oz) 65.9 kg (145 lb 4.5 oz) 65.1 kg (143 lb 8.3 oz)    Exam:   General:  Pt in NAD, Alert and Awake  Cardiovascular: RRR, No rubs  Respiratory: CTA BL, no wheezes, decreased Breath sounds at  right lung base  Abdomen: tenderness on palpation generalized, ND  Musculoskeletal: no cyanosis or clubbing   Data Reviewed: Basic Metabolic Panel:  Recent Labs Lab 03/04/12 0435 03/05/12 0450 03/06/12 0451 03/07/12 0354 03/08/12 0535  NA 138 134* 138 136 134*  K 4.1 4.0 3.8 3.8 4.5  CL 103 100 101 99 98  CO2 28 27 30  32 33*  GLUCOSE 92 111* 152* 116* 129*  BUN 12 10 11 11 16   CREATININE 0.94 0.88 0.84 0.78 0.75  CALCIUM 8.4 8.3* 8.3* 8.6 8.6  MG  --   --  1.7 1.9  --   PHOS  --   --  2.1* 2.7  --    Liver Function Tests:  Recent Labs Lab 03/04/12 0435 03/05/12 0450 03/06/12 0451 03/07/12 0354 03/08/12 0535  AST 24 25 17 15 11   ALT 11 14 11 10 8   ALKPHOS 78 82 70 62 53  BILITOT 0.3 0.3 0.4 0.3 0.3  PROT 5.3* 5.4* 5.6* 5.4* 5.0*  ALBUMIN 2.2* 2.3* 2.2* 2.3* 2.0*   No results found for this basename: LIPASE, AMYLASE,  in the last 168 hours No results found for this basename: AMMONIA,  in the last 168 hours CBC:  Recent Labs Lab 03/04/12 0435 03/05/12 0450 03/06/12 0451 03/07/12 0354 03/08/12 0535  WBC 15.5* 17.7* 15.5* 15.3* 13.9*  NEUTROABS  --   --  12.4*  --   --   HGB 9.3* 8.8* 8.7* 8.9* 8.2*  HCT 29.8* 28.0* 28.0* 27.7* 25.7*  MCV 87.4 85.9 85.6 85.5 85.7  PLT 328 310 318 318 348   Cardiac Enzymes: No results found for this basename: CKTOTAL, CKMB, CKMBINDEX, TROPONINI,  in the last 168 hours BNP (last 3 results)  Recent Labs  11/02/11 0623 11/03/11 0628 03/05/12 0450  PROBNP 4539.0* 1925.0* 8790.0*   CBG:  Recent Labs Lab 03/07/12 2000 03/08/12 03/08/12 0419 03/08/12 0813 03/08/12 1139  GLUCAP 112* 140* 130* 147* 144*    Recent Results (from the past 240 hour(s))  URINE CULTURE     Status: None   Collection Time    02/29/12  3:51 PM      Result Value Range Status   Specimen Description URINE, CLEAN CATCH   Final   Special Requests NONE   Final   Culture  Setup Time 03/01/2012 01:39   Final   Colony Count >=100,000  COLONIES/ML   Final   Culture ESCHERICHIA COLI   Final   Report Status 03/02/2012 FINAL   Final   Organism ID, Bacteria ESCHERICHIA COLI   Final  CLOSTRIDIUM DIFFICILE BY PCR     Status: None   Collection Time    03/03/12  1:27 AM      Result Value Range Status   C difficile by pcr NEGATIVE  NEGATIVE Final     Studies: No results found.  Scheduled Meds: . antiseptic oral rinse  15 mL Mouth Rinse  [id number]  . budesonide-formoterol  1 puff Inhalation BID  . chlorhexidine  15 mL Mouth Rinse BID  . ertapenem  1 g Intravenous Q24H  . heparin  5,000 Units Subcutaneous Q8H  . insulin aspart  0-9 Units Subcutaneous Q4H  . ondansetron (ZOFRAN) IV  8 mg Intravenous Q6H  . sodium chloride  3 mL Intravenous Q12H  . tiotropium  18 mcg Inhalation Daily   Continuous Infusions: . sodium chloride 20 mL/hr at 03/08/12 0554  . sodium chloride 20 mL/hr at 03/08/12 0554  . TPN (CLINIMIX) +/- additives     And  . fat emulsion    . TPN (CLINIMIX) +/- additives 70 mL/hr at 03/07/12 1712    Principal Problem:   Diverticulitis Active Problems:   HYPERTENSION   UTI (lower urinary tract infection)   Chronic respiratory failure with hypoxia   Diastolic CHF, chronic   Pancreatic mass   Anemia   Wound of left leg   Leukocytosis, unspecified   Nonspecific (abnormal) findings on radiological and other examination of gastrointestinal tract   Preop cardiovascular exam    Time spent: > 35 minutes    Penny Pia  [facility] Pager [phone]. If 7PM-7AM, please contact night-coverage at [email or web address], password [facility] 03/08/2012, 3:45 PM  LOS: 8 days

## 2012-03-08 NOTE — Progress Notes (Signed)
Patient ID: Katie Galvan, female   DOB: Sep 29, 1927, 79 y.o.   MRN: [id number]    Subjective: Pt reports that she feels fine today, denies abd pain/n/v  Objective: Vital signs in last 24 hours: Temp:  [97.2 F (36.2 C)-98.1 F (36.7 C)] 97.6 F (36.4 C) (03/07 0547) Pulse Rate:  [72-89] 87 (03/07 0547) Resp:  [18-22] 22 (03/07 0547) BP: (134-149)/(60-82) 149/82 mmHg (03/07 0547) SpO2:  [93 %-100 %] 94 % (03/07 0848) Weight:  [143 lb 8.3 oz (65.1 kg)] 143 lb 8.3 oz (65.1 kg) (03/07 0500) Last BM Date: 03/07/12  Intake/Output from previous day: 03/06 0701 - 03/07 0700 In: 2652.5 [I.V.:948; IV Piggyback:100; TPN:1604.5] Out: 2130 [Urine:2130] Intake/Output this shift:    PE: Abd: soft, mildly tender, +bs General: awake, alert, On O2, appears chronically ill but not acute.  Lab Results:   Recent Labs  03/07/12 0354 03/08/12 0535  WBC 15.3* 13.9*  HGB 8.9* 8.2*  HCT 27.7* 25.7*  PLT 318 348   BMET  Recent Labs  03/07/12 0354 03/08/12 0535  NA 136 134*  K 3.8 4.5  CL 99 98  CO2 32 33*  GLUCOSE 116* 129*  BUN 11 16  CREATININE 0.78 0.75  CALCIUM 8.6 8.6   PT/INR No results found for this basename: LABPROT, INR,  in the last 72 hours CMP     Component Value Date/Time   NA 134* 03/08/2012 0535   NA 139 11/27/2011 1533   K 4.5 03/08/2012 0535   K 4.1 11/27/2011 1533   CL 98 03/08/2012 0535   CL 100 11/27/2011 1533   CO2 33* 03/08/2012 0535   CO2 31* 11/27/2011 1533   GLUCOSE 129* 03/08/2012 0535   GLUCOSE 97 11/27/2011 1533   BUN 16 03/08/2012 0535   BUN 22.0 11/27/2011 1533   CREATININE 0.75 03/08/2012 0535   CREATININE 0.9 11/27/2011 1533   CALCIUM 8.6 03/08/2012 0535   CALCIUM 10.0 11/27/2011 1533   PROT 5.0* 03/08/2012 0535   PROT 7.1 11/27/2011 1533   ALBUMIN 2.0* 03/08/2012 0535   ALBUMIN 3.4* 11/27/2011 1533   AST 11 03/08/2012 0535   AST 16 11/27/2011 1533   ALT 8 03/08/2012 0535   ALT 11 11/27/2011 1533   ALKPHOS 53 03/08/2012 0535   ALKPHOS 73 11/27/2011  1533   BILITOT 0.3 03/08/2012 0535   BILITOT 0.34 11/27/2011 1533   GFRNONAA 77* 03/08/2012 0535   GFRAA 89* 03/08/2012 0535   Lipase     Component Value Date/Time   LIPASE 8* 11/08/2011 1539       Studies/Results: No results found.  Anti-infectives: Anti-infectives   Start     Dose/Rate Route Frequency Ordered Stop   03/06/12 1200  ertapenem (INVANZ) 1 g in sodium chloride 0.9 % 50 mL IVPB     1 g 100 mL/hr over 30 Minutes Intravenous Every 24 hours 03/06/12 1059     03/03/12 1200  cefTRIAXone (ROCEPHIN) 1 g in dextrose 5 % 50 mL IVPB  Status:  Discontinued     1 g 100 mL/hr over 30 Minutes Intravenous Every 24 hours 03/03/12 0958 03/06/12 1059   02/29/12 2000  metroNIDAZOLE (FLAGYL) IVPB 500 mg  Status:  Discontinued     500 mg 100 mL/hr over 60 Minutes Intravenous Every 8 hours 02/29/12 1919 03/06/12 1059   02/29/12 2000  ciprofloxacin (CIPRO) IVPB 400 mg  Status:  Discontinued     400 mg 200 mL/hr over 60 Minutes Intravenous Every 12 hours 02/29/12 1925 03/03/12  1610   02/29/12 1745  cefTRIAXone (ROCEPHIN) 1 g in dextrose 5 % 50 mL IVPB     1 g 100 mL/hr over 30 Minutes Intravenous  Once 02/29/12 1744 02/29/12 1859   02/29/12 1745  metroNIDAZOLE (FLAGYL) IVPB 500 mg  Status:  Discontinued     500 mg 100 mL/hr over 60 Minutes Intravenous  Once 02/29/12 1744 02/29/12 1929   02/29/12 1745  ciprofloxacin (CIPRO) IVPB 400 mg  Status:  Discontinued     400 mg 200 mL/hr over 60 Minutes Intravenous  Once 02/29/12 1744 02/29/12 1928       Assessment/Plan 1. Diverticulitis vs colitis of sigmoid colon: CT scan - 03/04/2012 - chronic sigmoid colon thickening over about 10 cm (similar to CT scan 03/01/2011 and may be a little worse than 10/31/2011), seems to be improving, WBC improving, no acute indications for surgical intervention, continue TNA, ?try clears-will discuss with Dr. Donell Beers       LOS: 8 days    Avital Dancy, [facility] 03/08/2012

## 2012-03-08 NOTE — Progress Notes (Signed)
PARENTERAL NUTRITION CONSULT NOTE - FOLLOW UP  Pharmacy Consult for TNA Indication: intolerance to enteral feeding (Diverticulitis vs colitis of sigmoid colon, awaiting surgery)   Allergies  Allergen Reactions  . Sulfonamide Derivatives Swelling    Patient Measurements: Height: 5' (152.4 cm) Weight: 143 lb 8.3 oz (65.1 kg) (Simultaneous filing. User may not have seen previous data.) IBW/kg (Calculated) : 45.5 Usual Weight: 65 kg  Vital Signs: Temp: 97.6 F (36.4 C) (03/07 0547) Temp src: Oral (03/07 0547) BP: 149/82 mmHg (03/07 0547) Pulse Rate: 87 (03/07 0547) Intake/Output from previous day: 03/06 0701 - 03/07 0700 In: 2652.5 [I.V.:948; IV Piggyback:100; TPN:1604.5] Out: 2130 [Urine:2130] Intake/Output from this shift:    Labs:  Recent Labs  03/06/12 0451 03/07/12 0354 03/08/12 0535  WBC 15.5* 15.3* 13.9*  HGB 8.7* 8.9* 8.2*  HCT 28.0* 27.7* 25.7*  PLT 318 318 348     Recent Labs  03/06/12 0451 03/07/12 0354 03/08/12 0535  NA 138 136 134*  K 3.8 3.8 4.5  CL 101 99 98  CO2 30 32 33*  GLUCOSE 152* 116* 129*  BUN 11 11 16   CREATININE 0.84 0.78 0.75  CALCIUM 8.3* 8.6 8.6  MG 1.7 1.9  --   PHOS 2.1* 2.7  --   PROT 5.6* 5.4* 5.0*  ALBUMIN 2.2* 2.3* 2.0*  AST 17 15 11   ALT 11 10 8   ALKPHOS 70 62 53  BILITOT 0.4 0.3 0.3  PREALBUMIN 7.9*  --   --   TRIG 80  --   --   CHOL 91  --   --    Estimated Creatinine Clearance: 45.6 ml/min (by C-G formula based on Cr of 0.75).    Recent Labs  03/07/12 2000 03/08/12 03/08/12 0419  GLUCAP 112* 140* 130*   Assessment:  84 yom with h/o diveriticulitis presented 2/27 with abd pain, N/V and fatigue. CT abd/pelvis in ED with worsened chronic inflammatory process involving the sigmoid colon. Pt admitted for acute on chronic sigmoiditis, diverticulitis, and UTI. GI on board, pt did not improve with Cipro/Flagyl. Repeat CT 3/3 with chronic sigmoid colon thickening, small area of air either contained within  diverticulum or escaped from bowel. Surgery, cardiology and pulmonary consulted and investigating perioperative risk.  TNA ordered to start 3/4 given intolerance to enteral feeding.  - 3/7: Today is TNA day#4.  Cardiology and pulmonary cleared for surgery if needed but pt is a poor candidate for surgery.  Plan continue bowel rest, TNA, abx, repeat imaging in a few days hoping for improvement. Surgery is still a possibility. Pt is on D3 ertapenem.   Nutritional Goals:  - RD recs per day: 1420-1660 Kcal, 80-94 g protein, 2L fluid.  -Clinimix E5/20 at 57ml/hr + IVFE 20% at 40ml/hr on MWF to provide: 84g/day protein, 1684Kcal/day (Avg 1478Kcal/day STTHS, 1958Kcal/day MWF).  Current nutrition:  - Diet: NPO since 3/3 - TNA: Clinimix E 5/20 @ goal rate of 70 ml/hr - mIVF: NS at 20 ml/hr  CBGs & Insulin requirements past 24 hours:  - CBGs < 150, required 6 units of sensitive SSI   Labs: Electrolytes: K+, Mag, Phos wnl.  Na+ down to 134 Renal Function: Scr wnl, I/O 2652/2130cc (net +522cc).  UOP = 1.36 ml/kg/hr. Hepatic Function: wnl  Pre-Albumin: 7.9 (3/5)  Triglycerides: 80 (3/5) CBGs: controlled on SSI q6h   Plan:    Continue Clinimix E 5/20 at goal rate of 70 ml/hr.   TNA to contain IV fat emulsion 20% at 10 ml/hr on  MWF only due to ongoing shortage  Standard multivitamins and trace elements on MWF only due to ongoing shortage.  Patient not taking anything by mouth so unable to convert MV to PO at this time.  TNA labs Monday/Thursdays  Pharmacy will follow up daily  Geoffry Paradise, PharmD, BCPS Pager: [phone] 8:38 AM Pharmacy #: [phone]

## 2012-03-08 NOTE — Progress Notes (Signed)
Seen and evaluated patient.  She told me she felt a little better, but she remains tender on exam.  Advised patient again that I think she needs surgery, but not emergent.  She would like to think about it and talk to her son today.  She is hoping for more improvement on antibiotics.

## 2012-03-09 LAB — COMPREHENSIVE METABOLIC PANEL
AST: 14 U/L (ref 0–37)
Alkaline Phosphatase: 62 U/L (ref 39–117)
BUN: 20 mg/dL (ref 6–23)
CO2: 32 mEq/L (ref 19–32)
Chloride: 100 mEq/L (ref 96–112)
Creatinine, Ser: 0.79 mg/dL (ref 0.50–1.10)
GFR calc non Af Amer: 75 mL/min — ABNORMAL LOW (ref >90)
Potassium: 4.6 mEq/L (ref 3.5–5.1)
Total Bilirubin: 0.2 mg/dL — ABNORMAL LOW (ref 0.3–1.2)

## 2012-03-09 LAB — GLUCOSE, CAPILLARY
Glucose-Capillary: 127 mg/dL — ABNORMAL HIGH (ref 70–99)
Glucose-Capillary: 142 mg/dL — ABNORMAL HIGH (ref 70–99)

## 2012-03-09 LAB — CBC
MCH: 26.9 pg (ref 26.0–34.0)
MCV: 85.9 fL (ref 78.0–100.0)
Platelets: 432 10*3/uL — ABNORMAL HIGH (ref 150–400)
RBC: 3.12 MIL/uL — ABNORMAL LOW (ref 3.87–5.11)
RDW: 16.7 % — ABNORMAL HIGH (ref 11.5–15.5)
WBC: 14.6 10*3/uL — ABNORMAL HIGH (ref 4.0–10.5)

## 2012-03-09 MED ORDER — CLINIMIX E/DEXTROSE (5/20) 5 % IV SOLN
INTRAVENOUS | Status: AC
  Administered 2012-03-09: 17:00:00 via INTRAVENOUS
  Filled 2012-03-09: qty 2000

## 2012-03-09 NOTE — Progress Notes (Signed)
Patient ID: Katie Galvan, female   DOB: 01/30/1927, 79 y.o.   MRN: [id number]    Subjective: Pt reports that she feels better today.  She is having L sided abd pain only, denies flatus or BM's  Objective: Vital signs in last 24 hours: Temp:  [97.5 F (36.4 C)-98.9 F (37.2 C)] 98.9 F (37.2 C) (03/08 0537) Pulse Rate:  [80-86] 83 (03/08 0537) Resp:  [18-20] 18 (03/08 0537) BP: (128-131)/(66-84) 130/84 mmHg (03/08 0537) SpO2:  [94 %-99 %] 99 % (03/08 0923) Weight:  [142 lb 13.7 oz (64.8 kg)] 142 lb 13.7 oz (64.8 kg) (03/08 0500) Last BM Date: 03/08/12  Intake/Output from previous day: 03/07 0701 - 03/08 0700 In: 1360 [I.V.:240; IV Piggyback:200; TPN:920] Out: 2475 [Urine:2475] Intake/Output this shift:    PE: Abd: soft, mildly tender in LLQ General: awake, alert, On O2, appears chronically ill but not acute.  Lab Results:   Recent Labs  03/08/12 0535 03/09/12 0445  WBC 13.9* 14.6*  HGB 8.2* 8.4*  HCT 25.7* 26.8*  PLT 348 432*   BMET  Recent Labs  03/08/12 0535 03/09/12 0445  NA 134* 135  K 4.5 4.6  CL 98 100  CO2 33* 32  GLUCOSE 129* 149*  BUN 16 20  CREATININE 0.75 0.79  CALCIUM 8.6 8.7   PT/INR No results found for this basename: LABPROT, INR,  in the last 72 hours CMP     Component Value Date/Time   NA 135 03/09/2012 0445   NA 139 11/27/2011 1533   K 4.6 03/09/2012 0445   K 4.1 11/27/2011 1533   CL 100 03/09/2012 0445   CL 100 11/27/2011 1533   CO2 32 03/09/2012 0445   CO2 31* 11/27/2011 1533   GLUCOSE 149* 03/09/2012 0445   GLUCOSE 97 11/27/2011 1533   BUN 20 03/09/2012 0445   BUN 22.0 11/27/2011 1533   CREATININE 0.79 03/09/2012 0445   CREATININE 0.9 11/27/2011 1533   CALCIUM 8.7 03/09/2012 0445   CALCIUM 10.0 11/27/2011 1533   PROT 5.6* 03/09/2012 0445   PROT 7.1 11/27/2011 1533   ALBUMIN 2.1* 03/09/2012 0445   ALBUMIN 3.4* 11/27/2011 1533   AST 14 03/09/2012 0445   AST 16 11/27/2011 1533   ALT 7 03/09/2012 0445   ALT 11 11/27/2011 1533   ALKPHOS 62  03/09/2012 0445   ALKPHOS 73 11/27/2011 1533   BILITOT 0.2* 03/09/2012 0445   BILITOT 0.34 11/27/2011 1533   GFRNONAA 75* 03/09/2012 0445   GFRAA 87* 03/09/2012 0445   Lipase     Component Value Date/Time   LIPASE 8* 11/08/2011 1539       Studies/Results: No results found.  Anti-infectives: Anti-infectives   Start     Dose/Rate Route Frequency Ordered Stop   03/06/12 1200  ertapenem (INVANZ) 1 g in sodium chloride 0.9 % 50 mL IVPB     1 g 100 mL/hr over 30 Minutes Intravenous Every 24 hours 03/06/12 1059     03/03/12 1200  cefTRIAXone (ROCEPHIN) 1 g in dextrose 5 % 50 mL IVPB  Status:  Discontinued     1 g 100 mL/hr over 30 Minutes Intravenous Every 24 hours 03/03/12 0958 03/06/12 1059   02/29/12 2000  metroNIDAZOLE (FLAGYL) IVPB 500 mg  Status:  Discontinued     500 mg 100 mL/hr over 60 Minutes Intravenous Every 8 hours 02/29/12 1919 03/06/12 1059   02/29/12 2000  ciprofloxacin (CIPRO) IVPB 400 mg  Status:  Discontinued     400 mg 200  mL/hr over 60 Minutes Intravenous Every 12 hours 02/29/12 1925 03/03/12 0958   02/29/12 1745  cefTRIAXone (ROCEPHIN) 1 g in dextrose 5 % 50 mL IVPB     1 g 100 mL/hr over 30 Minutes Intravenous  Once 02/29/12 1744 02/29/12 1859   02/29/12 1745  metroNIDAZOLE (FLAGYL) IVPB 500 mg  Status:  Discontinued     500 mg 100 mL/hr over 60 Minutes Intravenous  Once 02/29/12 1744 02/29/12 1929   02/29/12 1745  ciprofloxacin (CIPRO) IVPB 400 mg  Status:  Discontinued     400 mg 200 mL/hr over 60 Minutes Intravenous  Once 02/29/12 1744 02/29/12 1928       Assessment/Plan 1. Diverticulitis vs colitis of sigmoid colon: CT scan - 03/04/2012 - chronic sigmoid colon thickening over about 10 cm (similar to CT scan 03/01/2011 and may be a little worse than 10/31/2011),  WBC up a little today, no acute indications for surgical intervention, continue TNA, cont NPO.  Will recheck cbc tom      LOS: 9 days    THOMAS, ALICIA C. 03/09/2012

## 2012-03-09 NOTE — Progress Notes (Signed)
We will be available if needed - defer to surgery re: management of diverticulits  Thanks  Iva Boop, MD, [facility] [phone] (pager) 03/09/2012 2:58 PM

## 2012-03-09 NOTE — Progress Notes (Signed)
Dr. Kirby Crigler note reviewed. Dr. Alford Highland note reviewed. No further comments.

## 2012-03-09 NOTE — Progress Notes (Signed)
OT Cancellation Note  Patient Details Name: Katie Galvan MRN: [id number] DOB: [birth year]   Cancelled Treatment:    Reason Eval/Treat Not Completed: Fatigue/lethargy limiting ability to participate  Robet Leu 03/09/2012, 11:08 AM

## 2012-03-09 NOTE — Progress Notes (Signed)
PARENTERAL NUTRITION CONSULT NOTE - FOLLOW UP  Pharmacy Consult for TNA Indication: intolerance to enteral feeding (Diverticulitis vs colitis of sigmoid colon, awaiting surgery)   Allergies  Allergen Reactions  . Sulfonamide Derivatives Swelling    Patient Measurements: Height: 5' (152.4 cm) Weight: 142 lb 13.7 oz (64.8 kg) IBW/kg (Calculated) : 45.5 Usual Weight: 65 kg  Vital Signs: Temp: 98.9 F (37.2 C) (03/08 0537) Temp src: Oral (03/08 0537) BP: 130/84 mmHg (03/08 0537) Pulse Rate: 83 (03/08 0537) Intake/Output from previous day: 03/07 0701 - 03/08 0700 In: 1360 [I.V.:240; IV Piggyback:200; TPN:920] Out: 2475 [Urine:2475] Intake/Output from this shift:    Labs:  Recent Labs  03/07/12 0354 03/08/12 0535 03/09/12 0445  WBC 15.3* 13.9* 14.6*  HGB 8.9* 8.2* 8.4*  HCT 27.7* 25.7* 26.8*  PLT 318 348 432*     Recent Labs  03/07/12 0354 03/08/12 0535 03/09/12 0445  NA 136 134* 135  K 3.8 4.5 4.6  CL 99 98 100  CO2 32 33* 32  GLUCOSE 116* 129* 149*  BUN 11 16 20   CREATININE 0.78 0.75 0.79  CALCIUM 8.6 8.6 8.7  MG 1.9  --   --   PHOS 2.7  --   --   PROT 5.4* 5.0* 5.6*  ALBUMIN 2.3* 2.0* 2.1*  AST 15 11 14   ALT 10 8 7   ALKPHOS 62 53 62  BILITOT 0.3 0.3 0.2*   Estimated Creatinine Clearance: 45.5 ml/min (by C-G formula based on Cr of 0.79).    Recent Labs  03/09/12 0009 03/09/12 0352 03/09/12 0752  GLUCAP 147* 161* 127*   Assessment:  84 yom with h/o diveriticulitis presented 2/27 with abd pain, N/V and fatigue. CT abd/pelvis in ED with worsened chronic inflammatory process involving the sigmoid colon. Pt admitted for acute on chronic sigmoiditis, diverticulitis, and UTI. GI on board, pt did not improve with Cipro/Flagyl. Repeat CT 3/3 with chronic sigmoid colon thickening, small area of air either contained within diverticulum or escaped from bowel. Surgery, cardiology and pulmonary consulted and investigating perioperative risk.  TNA ordered to  start 3/4 given intolerance to enteral feeding.  - 3/8: Today is TNA day#5.  Cardiology and pulmonary cleared for surgery if needed and Dr. Donell Beers has advised patient that surgery is needed but it is not emergent so patient to think about it further per notes.  Plan continue bowel rest, TNA, abx, repeat imaging in a few days hoping for improvement. Surgery is still a possibility. Pt is on D4 ertapenem.   Nutritional Goals:  - RD recs per day: 1420-1660 Kcal, 80-94 g protein, 2L fluid.  -Clinimix E5/20 at 20ml/hr + IVFE 20% at 67ml/hr on MWF to provide: 84g/day protein, 1684Kcal/day (Avg 1478Kcal/day STTHS, 1958Kcal/day MWF).  Current nutrition:  - Diet: NPO since 3/3 - TNA: Clinimix E 5/20 @ goal rate of 70 ml/hr - mIVF: NS at 20 ml/hr  CBGs & Insulin requirements past 24 hours:  - CBGs stable with one value > 150, required 8 units of sensitive SSI   Labs: Electrolytes: stable Renal Function: Scr wnl, I/O not correct.  UOP = 1.4 ml/kg/hr. Hepatic Function: wnl  Pre-Albumin: 7.9 (3/5)  Triglycerides: 80 (3/5) CBGs: 127-156 on SSI q6   Plan:    Continue Clinimix E 5/20 at goal rate of 70 ml/hr.   TNA to contain IV fat emulsion 20% at 10 ml/hr on MWF only due to ongoing shortage  Standard multivitamins and trace elements on MWF only due to ongoing shortage.  Patient not taking anything by mouth so unable to convert MVI to PO at this time.  TNA labs Monday/Thursdays  Pharmacy will follow up daily   Hessie Knows, PharmD, BCPS Pager [phone] 03/09/2012 9:52 AM

## 2012-03-09 NOTE — Progress Notes (Signed)
[facility] PROGRESS NOTE  Katie Galvan WGN:[id number] DOB: 11/16/1927 DOA: 02/29/2012 PCP: Michele Mcalpine, MD  Assessment/Plan:  From prior records: Sigmoiditis, acute on chronic. Had diverticulitis in same area in 2012, then 11/2011. She may have chronic divericulitis but is it also possible that she has an underlying malignancy. Her repeat CT from 3/3 demonstrated either a small area of air either contained within a diverticulum or escaped from the bowel. WBC continues to rise. She was seen by surgery on 3/3 and we are investigating her perioperative risk. I have called [facility] for who follows her for hx of perimyocarditis (now resolved) and now diastolic heart failure. Able to walk 1-2 blocks before dyspneic. Spoke with [name] who agrees that the patient would be moderate or high risk for surgery.  - NPO and TNA  - Seen by surgical team who has requested pulmonology evaluation for preop clearance. - Pulmonary team has cleared as high risk - Cardiology has cleared as well - Continue antiemetics - C. Diff neg  - Occult positive  - Surgery on board and awaiting final decision from patient. - Will defer further recommendations to surgical team and GI specialist  UTI, E. coli intermediate resistance to cipro.  - Patient completed 4 days of ceftriaxone - Urine culture grew out E coli sensitive to ceftriaxone  HYPERTENSION/HLD, chronic diastolic grade 1 /systolic (EF now 78-46%) CHF stable. - CXR shows no convincing pulmonary edema - BNP 9790 - continue prn labetalol for SBP > 160  - continue daily weights and strict I/O   Chronic respiratory failure with hypoxia due to COPD, restrictive lung disease from elevated right hemidiaphragm, stable.  - Continue 2L [place]  - Continue symbicort and spiriva with albuterol prn  - Incentive spirometry   Pancreatic mass -stable and being followed by GI.   Bilateral lower extremity ulcers, appear to be healing well  - Continue  dressing changes daily  - stable  Leukocytosis, unspecified, likely related to UTI and sigmoiditis. Rising despite antibiotics possibly due to microperforation or underlying malignancy  - Trend, tx as above  - continue invanze (today is day 4).   Iron deficiency anemia, hgb stable.  - Tx for hemoglobin < 7  - Iron studies sent by GI: Ferritin elevated likely due to inflammation, but iron level low.  - occult stool positive - GI on board   Code Status: DNR Family Communication: no family at bedside Disposition Plan: Pending further recommendations from surgeons on board.   Consultants:  Dr. Arlyce Dice ( GI)  Dr. Ezzard Standing (surgery)  Dr. Molli Knock (Pulm)  Procedures:  none  Antibiotics:  invanze  HPI/Subjective: Patient has had some more abdominal discomfort today.  Would like to avoid surgery but is open to it should her condition worsen. No new complaints  Objective: Filed Vitals:   03/09/12 0500 03/09/12 0537 03/09/12 0923 03/09/12 1300  BP:  130/84  124/68  Pulse:  83  86  Temp:  98.9 F (37.2 C)  98.7 F (37.1 C)  TempSrc:  Oral  Oral  Resp:  18  17  Height:      Weight: 64.8 kg (142 lb 13.7 oz)     SpO2:  98% 99% 99%    Intake/Output Summary (Last 24 hours) at 03/09/12 1417 Last data filed at 03/09/12 1300  Gross per 24 hour  Intake   1360 ml  Output   3000 ml  Net  -1640 ml   Filed Weights   03/07/12 0429 03/08/12 0500 03/09/12  0500  Weight: 65.9 kg (145 lb 4.5 oz) 65.1 kg (143 lb 8.3 oz) 64.8 kg (142 lb 13.7 oz)    Exam:   General:  Pt in NAD, Alert and Awake  Cardiovascular: RRR, No rubs  Respiratory: CTA BL, no wheezes, decreased Breath sounds at right lung base  Abdomen: tenderness on palpation generalized, ND  Musculoskeletal: no cyanosis or clubbing   Data Reviewed: Basic Metabolic Panel:  Recent Labs Lab 03/05/12 0450 03/06/12 0451 03/07/12 0354 03/08/12 0535 03/09/12 0445  NA 134* 138 136 134* 135  K 4.0 3.8 3.8 4.5 4.6  CL  100 101 99 98 100  CO2 27 30 32 33* 32  GLUCOSE 111* 152* 116* 129* 149*  BUN 10 11 11 16 20   CREATININE 0.88 0.84 0.78 0.75 0.79  CALCIUM 8.3* 8.3* 8.6 8.6 8.7  MG  --  1.7 1.9  --   --   PHOS  --  2.1* 2.7  --   --    Liver Function Tests:  Recent Labs Lab 03/05/12 0450 03/06/12 0451 03/07/12 0354 03/08/12 0535 03/09/12 0445  AST 25 17 15 11 14   ALT 14 11 10 8 7   ALKPHOS 82 70 62 53 62  BILITOT 0.3 0.4 0.3 0.3 0.2*  PROT 5.4* 5.6* 5.4* 5.0* 5.6*  ALBUMIN 2.3* 2.2* 2.3* 2.0* 2.1*   No results found for this basename: LIPASE, AMYLASE,  in the last 168 hours No results found for this basename: AMMONIA,  in the last 168 hours CBC:  Recent Labs Lab 03/05/12 0450 03/06/12 0451 03/07/12 0354 03/08/12 0535 03/09/12 0445  WBC 17.7* 15.5* 15.3* 13.9* 14.6*  NEUTROABS  --  12.4*  --   --   --   HGB 8.8* 8.7* 8.9* 8.2* 8.4*  HCT 28.0* 28.0* 27.7* 25.7* 26.8*  MCV 85.9 85.6 85.5 85.7 85.9  PLT 310 318 318 348 432*   Cardiac Enzymes: No results found for this basename: CKTOTAL, CKMB, CKMBINDEX, TROPONINI,  in the last 168 hours BNP (last 3 results)  Recent Labs  11/02/11 0623 11/03/11 0628 03/05/12 0450  PROBNP 4539.0* 1925.0* 8790.0*   CBG:  Recent Labs Lab 03/08/12 1645 03/08/12 1951 03/09/12 0009 03/09/12 0352 03/09/12 0752  GLUCAP 156* 127* 147* 161* 127*    Recent Results (from the past 240 hour(s))  URINE CULTURE     Status: None   Collection Time    02/29/12  3:51 PM      Result Value Range Status   Specimen Description URINE, CLEAN CATCH   Final   Special Requests NONE   Final   Culture  Setup Time 03/01/2012 01:39   Final   Colony Count >=100,000 COLONIES/ML   Final   Culture ESCHERICHIA COLI   Final   Report Status 03/02/2012 FINAL   Final   Organism ID, Bacteria ESCHERICHIA COLI   Final  CLOSTRIDIUM DIFFICILE BY PCR     Status: None   Collection Time    03/03/12  1:27 AM      Result Value Range Status   C difficile by pcr NEGATIVE   NEGATIVE Final     Studies: No results found.  Scheduled Meds: . antiseptic oral rinse  15 mL Mouth Rinse q12n4p  . budesonide-formoterol  1 puff Inhalation BID  . chlorhexidine  15 mL Mouth Rinse BID  . ertapenem  1 g Intravenous Q24H  . heparin  5,000 Units Subcutaneous Q8H  . insulin aspart  0-9 Units Subcutaneous Q4H  . ondansetron (  ZOFRAN) IV  8 mg Intravenous Q6H  . sodium chloride  3 mL Intravenous Q12H  . tiotropium  18 mcg Inhalation Daily   Continuous Infusions: . sodium chloride 20 mL/hr at 03/09/12 0017  . sodium chloride 20 mL/hr at 03/09/12 0016  . TPN (CLINIMIX) +/- additives 70 mL/hr at 03/08/12 1723   And  . fat emulsion 240 mL (03/08/12 1723)  . TPN (CLINIMIX) +/- additives      Principal Problem:   Diverticulitis Active Problems:   HYPERTENSION   UTI (lower urinary tract infection)   Chronic respiratory failure with hypoxia   Diastolic CHF, chronic   Pancreatic mass   Anemia   Wound of left leg   Leukocytosis, unspecified   Nonspecific (abnormal) findings on radiological and other examination of gastrointestinal tract   Preop cardiovascular exam    Time spent: > 35 minutes    Penny Pia  [facility] Pager [phone]. If 7PM-7AM, please contact night-coverage at [email or web address], password [facility] 03/09/2012, 2:17 PM  LOS: 9 days

## 2012-03-10 ENCOUNTER — Inpatient Hospital Stay (HOSPITAL_COMMUNITY): Payer: Medicare Other

## 2012-03-10 ENCOUNTER — Encounter (HOSPITAL_COMMUNITY): Payer: Self-pay | Admitting: Radiology

## 2012-03-10 LAB — GLUCOSE, CAPILLARY
Glucose-Capillary: 155 mg/dL — ABNORMAL HIGH (ref 70–99)
Glucose-Capillary: 131 mg/dL — ABNORMAL HIGH (ref 70–99)
Glucose-Capillary: 134 mg/dL — ABNORMAL HIGH (ref 70–99)
Glucose-Capillary: 113 mg/dL — ABNORMAL HIGH (ref 70–99)

## 2012-03-10 LAB — CBC
MCH: 27.3 pg (ref 26.0–34.0)
MCHC: 31.8 g/dL (ref 30.0–36.0)
MCV: 86 fL (ref 78.0–100.0)
Platelets: 530 10*3/uL — ABNORMAL HIGH (ref 150–400)
RDW: 17.1 % — ABNORMAL HIGH (ref 11.5–15.5)

## 2012-03-10 LAB — COMPREHENSIVE METABOLIC PANEL
ALT: 14 U/L (ref 0–35)
AST: 22 U/L (ref 0–37)
Albumin: 2.2 g/dL — ABNORMAL LOW (ref 3.5–5.2)
Alkaline Phosphatase: 73 U/L (ref 39–117)
CO2: 29 mEq/L (ref 19–32)
Chloride: 97 mEq/L (ref 96–112)
Creatinine, Ser: 0.78 mg/dL (ref 0.50–1.10)
GFR calc non Af Amer: 76 mL/min — ABNORMAL LOW (ref >90)
Potassium: 4.5 mEq/L (ref 3.5–5.1)
Total Bilirubin: 0.4 mg/dL (ref 0.3–1.2)

## 2012-03-10 LAB — PRO B NATRIURETIC PEPTIDE: Pro B Natriuretic peptide (BNP): 278.1 pg/mL (ref 0–450)

## 2012-03-10 LAB — MAGNESIUM: Magnesium: 2.2 mg/dL (ref 1.5–2.5)

## 2012-03-10 MED ORDER — PIPERACILLIN-TAZOBACTAM 3.375 G IVPB
3.3750 g | Freq: Three times a day (TID) | INTRAVENOUS | Status: DC
  Filled 2012-03-10: qty 50

## 2012-03-10 MED ORDER — FLUCONAZOLE IN SODIUM CHLORIDE 200-0.9 MG/100ML-% IV SOLN
200.0000 mg | INTRAVENOUS | Status: DC
  Administered 2012-03-10 – 2012-03-15 (×6): 200 mg via INTRAVENOUS
  Filled 2012-03-10 (×7): qty 100

## 2012-03-10 MED ORDER — CLINIMIX E/DEXTROSE (5/20) 5 % IV SOLN
INTRAVENOUS | Status: AC
  Administered 2012-03-10: 17:00:00 via INTRAVENOUS
  Filled 2012-03-10: qty 2000

## 2012-03-10 MED ORDER — PIPERACILLIN-TAZOBACTAM 3.375 G IVPB
3.3750 g | Freq: Three times a day (TID) | INTRAVENOUS | Status: DC
  Administered 2012-03-10 – 2012-03-16 (×18): 3.375 g via INTRAVENOUS
  Filled 2012-03-10 (×20): qty 50

## 2012-03-10 MED ORDER — IOHEXOL 300 MG/ML  SOLN
100.0000 mL | Freq: Once | INTRAMUSCULAR | Status: AC | PRN
  Administered 2012-03-10: 100 mL via INTRAVENOUS

## 2012-03-10 MED ORDER — IOHEXOL 300 MG/ML  SOLN
50.0000 mL | Freq: Once | INTRAMUSCULAR | Status: AC | PRN
  Administered 2012-03-10: 50 mL via ORAL

## 2012-03-10 NOTE — Progress Notes (Signed)
PARENTERAL NUTRITION CONSULT NOTE - FOLLOW UP  Pharmacy Consult for TNA Indication: intolerance to enteral feeding (Diverticulitis vs colitis of sigmoid colon)   Allergies  Allergen Reactions  . Sulfonamide Derivatives Swelling    Patient Measurements: Height: 5' (152.4 cm) Weight: 139 lb 5.3 oz (63.2 kg) IBW/kg (Calculated) : 45.5 Usual Weight: 65 kg  Vital Signs: Temp: 98 F (36.7 C) (03/09 0617) Temp src: Oral (03/09 0617) BP: 128/66 mmHg (03/09 0617) Pulse Rate: 82 (03/09 0617) Intake/Output from previous day: 03/08 0701 - 03/09 0700 In: 622.3 [I.V.:472.3; IV Piggyback:150] Out: 2826 [Urine:2825; Stool:1] Intake/Output from this shift:    Labs:  Recent Labs  03/08/12 0535 03/09/12 0445 03/10/12 0505  WBC 13.9* 14.6* 14.5*  HGB 8.2* 8.4* 8.8*  HCT 25.7* 26.8* 27.7*  PLT 348 432* 530*     Recent Labs  03/08/12 0535 03/09/12 0445 03/10/12 0505  NA 134* 135 132*  K 4.5 4.6 4.5  CL 98 100 97  CO2 33* 32 29  GLUCOSE 129* 149* 138*  BUN 16 20 23   CREATININE 0.75 0.79 0.78  CALCIUM 8.6 8.7 9.0  MG  --   --  2.2  PHOS  --   --  3.2  PROT 5.0* 5.6* 5.9*  ALBUMIN 2.0* 2.1* 2.2*  AST 11 14 22   ALT 8 7 14   ALKPHOS 53 62 73  BILITOT 0.3 0.2* 0.4   Estimated Creatinine Clearance: 45 ml/min (by C-G formula based on Cr of 0.78).    Recent Labs  03/10/12 0026 03/10/12 0327 03/10/12 0852  GLUCAP 140* 155* 131*   Assessment:  84 yom with h/o diveriticulitis presented 2/27 with abd pain, N/V and fatigue. CT abd/pelvis in ED with worsened chronic inflammatory process involving the sigmoid colon. Pt admitted for acute on chronic sigmoiditis, diverticulitis, and UTI. GI on board, pt did not improve with Cipro/Flagyl. Repeat CT 3/3 with chronic sigmoid colon thickening, small area of air either contained within diverticulum or escaped from bowel. Surgery, cardiology and pulmonary consulted and investigating perioperative risk.  TNA ordered to start 3/4 given  intolerance to enteral feeding.  - 3/9: Today is TNA day#6.  Cardiology and pulmonary cleared for surgery if needed and Dr. Donell Beers has advised patient that surgery is needed but it is not emergent so patient to think about it further per notes.  Plan continue bowel rest, TNA, abx, repeat imaging in a few days hoping for improvement. Surgery is still a possibility.    Nutritional Goals:  - RD recs per day: 1420-1660 Kcal, 80-94 g protein, 2L fluid.  -Clinimix E5/20 at 11ml/hr + IVFE 20% at 71ml/hr on MWF to provide: 84g/day protein, 1684Kcal/day (Avg 1478Kcal/day STTHS, 1958Kcal/day MWF).  Current nutrition:  - Diet: NPO since 3/3 - TNA: Clinimix E 5/20 @ goal rate of 70 ml/hr - mIVF: NS at 20 ml/hr  CBGs & Insulin requirements past 24 hours:  - CBGs stable with one value > 150, required 8 units of sensitive SSI   Labs: Electrolytes: stable except Na 132 - management per Md Renal Function: Scr wnl, I/O not correct.  UOP = 2 ml/kg/hr. Hepatic Function: wnl  Pre-Albumin: 7.9 (3/5)  Triglycerides: 80 (3/5) CBGs: 127-155 on SSI q6   Plan:    Continue Clinimix E 5/20 at goal rate of 70 ml/hr.   TNA to contain IV fat emulsion 20% at 10 ml/hr on MWF only due to ongoing shortage  Standard multivitamins and trace elements on MWF only due to ongoing shortage.  Patient not taking anything by mouth so unable to convert MVI to PO at this time.  TNA labs Monday/Thursdays  Note patient still refusing surgical options   Hessie Knows, PharmD, BCPS Pager [phone] 03/10/2012 10:12 AM

## 2012-03-10 NOTE — Progress Notes (Signed)
Patient ID: Katie Galvan, female   DOB: December 29, 1927, 79 y.o.   MRN: [id number]    Subjective: Pt reports that she feels better today but states her pain and fatigue are the same.  She is having L sided abd pain only, having liquid BM's  Objective: Vital signs in last 24 hours: Temp:  [97.2 F (36.2 C)-98.7 F (37.1 C)] 98 F (36.7 C) (03/09 0617) Pulse Rate:  [82-88] 82 (03/09 0617) Resp:  [17-20] 20 (03/09 0617) BP: (114-128)/(56-68) 128/66 mmHg (03/09 0617) SpO2:  [99 %] 99 % (03/09 0617) Weight:  [139 lb 5.3 oz (63.2 kg)] 139 lb 5.3 oz (63.2 kg) (03/09 0617) Last BM Date: 03/09/12  Intake/Output from previous day: 03/08 0701 - 03/09 0700 In: 622.3 [I.V.:472.3; IV Piggyback:150] Out: 2826 [Urine:2825; Stool:1] Intake/Output this shift:    PE: Abd: soft, tender in LLQ General: awake, alert, On O2, appears chronically ill but not acute.  Lab Results:   Recent Labs  03/09/12 0445 03/10/12 0505  WBC 14.6* 14.5*  HGB 8.4* 8.8*  HCT 26.8* 27.7*  PLT 432* 530*   BMET  Recent Labs  03/09/12 0445 03/10/12 0505  NA 135 132*  K 4.6 4.5  CL 100 97  CO2 32 29  GLUCOSE 149* 138*  BUN 20 23  CREATININE 0.79 0.78  CALCIUM 8.7 9.0   PT/INR No results found for this basename: LABPROT, INR,  in the last 72 hours CMP     Component Value Date/Time   NA 132* 03/10/2012 0505   NA 139 11/27/2011 1533   K 4.5 03/10/2012 0505   K 4.1 11/27/2011 1533   CL 97 03/10/2012 0505   CL 100 11/27/2011 1533   CO2 29 03/10/2012 0505   CO2 31* 11/27/2011 1533   GLUCOSE 138* 03/10/2012 0505   GLUCOSE 97 11/27/2011 1533   BUN 23 03/10/2012 0505   BUN 22.0 11/27/2011 1533   CREATININE 0.78 03/10/2012 0505   CREATININE 0.9 11/27/2011 1533   CALCIUM 9.0 03/10/2012 0505   CALCIUM 10.0 11/27/2011 1533   PROT 5.9* 03/10/2012 0505   PROT 7.1 11/27/2011 1533   ALBUMIN 2.2* 03/10/2012 0505   ALBUMIN 3.4* 11/27/2011 1533   AST 22 03/10/2012 0505   AST 16 11/27/2011 1533   ALT 14 03/10/2012 0505   ALT 11  11/27/2011 1533   ALKPHOS 73 03/10/2012 0505   ALKPHOS 73 11/27/2011 1533   BILITOT 0.4 03/10/2012 0505   BILITOT 0.34 11/27/2011 1533   GFRNONAA 76* 03/10/2012 0505   GFRAA 88* 03/10/2012 0505   Lipase     Component Value Date/Time   LIPASE 8* 11/08/2011 1539       Studies/Results: No results found.  Anti-infectives: Anti-infectives   Start     Dose/Rate Route Frequency Ordered Stop   03/10/12 1000  piperacillin-tazobactam (ZOSYN) IVPB 3.375 g     3.375 g 12.5 mL/hr over 240 Minutes Intravenous Every 8 hours 03/10/12 0851     03/10/12 0815  piperacillin-tazobactam (ZOSYN) IVPB 3.375 g  Status:  Discontinued     3.375 g 12.5 mL/hr over 240 Minutes Intravenous Every 8 hours 03/10/12 0807 03/10/12 0851   03/10/12 0815  fluconazole (DIFLUCAN) IVPB 200 mg     200 mg 100 mL/hr over 60 Minutes Intravenous Every 24 hours 03/10/12 0807     03/06/12 1200  ertapenem (INVANZ) 1 g in sodium chloride 0.9 % 50 mL IVPB  Status:  Discontinued     1 g 100 mL/hr over 30 Minutes  Intravenous Every 24 hours 03/06/12 1059 03/10/12 0807   03/03/12 1200  cefTRIAXone (ROCEPHIN) 1 g in dextrose 5 % 50 mL IVPB  Status:  Discontinued     1 g 100 mL/hr over 30 Minutes Intravenous Every 24 hours 03/03/12 0958 03/06/12 1059   02/29/12 2000  metroNIDAZOLE (FLAGYL) IVPB 500 mg  Status:  Discontinued     500 mg 100 mL/hr over 60 Minutes Intravenous Every 8 hours 02/29/12 1919 03/06/12 1059   02/29/12 2000  ciprofloxacin (CIPRO) IVPB 400 mg  Status:  Discontinued     400 mg 200 mL/hr over 60 Minutes Intravenous Every 12 hours 02/29/12 1925 03/03/12 0958   02/29/12 1745  cefTRIAXone (ROCEPHIN) 1 g in dextrose 5 % 50 mL IVPB     1 g 100 mL/hr over 30 Minutes Intravenous  Once 02/29/12 1744 02/29/12 1859   02/29/12 1745  metroNIDAZOLE (FLAGYL) IVPB 500 mg  Status:  Discontinued     500 mg 100 mL/hr over 60 Minutes Intravenous  Once 02/29/12 1744 02/29/12 1929   02/29/12 1745  ciprofloxacin (CIPRO) IVPB 400 mg   Status:  Discontinued     400 mg 200 mL/hr over 60 Minutes Intravenous  Once 02/29/12 1744 02/29/12 1928       Assessment/Plan 1. Diverticulitis vs colitis of sigmoid colon: CT scan - 03/04/2012 - chronic sigmoid colon thickening over about 10 cm (similar to CT scan 03/01/2011 and may be a little worse than 10/31/2011),  WBC up a little today, no acute indications for surgical intervention, continue TNA, cont NPO.  Will recheck cbc tom.  Will switch antibiotics to Zosyn and Flagyl.  Hopefully this will help resolve her inflammation.  Patient still refusing any surgical options.      LOS: 10 days    THOMAS, ALICIA C. 03/10/2012

## 2012-03-10 NOTE — Progress Notes (Signed)
ANTIBIOTIC CONSULT NOTE - INITIAL  Pharmacy Consult for Zosyn Indication: diverticulitis  Allergies  Allergen Reactions  . Sulfonamide Derivatives Swelling    Patient Measurements: Height: 5' (152.4 cm) Weight: 139 lb 5.3 oz (63.2 kg) IBW/kg (Calculated) : 45.5  Vital Signs: Temp: 98 F (36.7 C) (03/09 0617) Temp src: Oral (03/09 0617) BP: 128/66 mmHg (03/09 0617) Pulse Rate: 82 (03/09 0617) Intake/Output from previous day: 03/08 0701 - 03/09 0700 In: 622.3 [I.V.:472.3; IV Piggyback:150] Out: 2826 [Urine:2825; Stool:1] Intake/Output from this shift:    Labs:  Recent Labs  03/08/12 0535 03/09/12 0445 03/10/12 0505  WBC 13.9* 14.6* 14.5*  HGB 8.2* 8.4* 8.8*  PLT 348 432* 530*  CREATININE 0.75 0.79 0.78   Estimated Creatinine Clearance: 45 ml/min (by C-G formula based on Cr of 0.78). No results found for this basename: VANCOTROUGH, VANCOPEAK, VANCORANDOM, GENTTROUGH, GENTPEAK, GENTRANDOM, TOBRATROUGH, TOBRAPEAK, TOBRARND, AMIKACINPEAK, AMIKACINTROU, AMIKACIN,  in the last 72 hours   Microbiology: Recent Results (from the past 720 hour(s))  URINE CULTURE     Status: None   Collection Time    02/29/12  3:51 PM      Result Value Range Status   Specimen Description URINE, CLEAN CATCH   Final   Special Requests NONE   Final   Culture  Setup Time 03/01/2012 01:39   Final   Colony Count >=100,000 COLONIES/ML   Final   Culture ESCHERICHIA COLI   Final   Report Status 03/02/2012 FINAL   Final   Organism ID, Bacteria ESCHERICHIA COLI   Final  CLOSTRIDIUM DIFFICILE BY PCR     Status: None   Collection Time    03/03/12  1:27 AM      Result Value Range Status   C difficile by pcr NEGATIVE  NEGATIVE Final  STOOL CULTURE     Status: None   Collection Time    03/08/12  6:46 AM      Result Value Range Status   Specimen Description STOOL   Final   Special Requests Normal   Final   Culture NO SUSPICIOUS COLONIES, CONTINUING TO HOLD   Final   Report Status PENDING    Incomplete    Medical History: Past Medical History  Diagnosis Date  . Disorders of diaphragm     right diaphragm elevation  . Unspecified essential hypertension   . Endocarditis, valve unspecified, unspecified cause   . Cardiac dysrhythmia, unspecified   . Unspecified venous (peripheral) insufficiency   . Edema   . Pure hypercholesterolemia   . Other dysphagia   . Diverticulosis of colon (without mention of hemorrhage)   . Benign neoplasm of colon   . Osteoarthrosis, unspecified whether generalized or localized, unspecified site   . Lumbago   . Chronic pain syndrome   . Disorder of bone and cartilage, unspecified   . Depressive disorder, not elsewhere classified   . Anemia, unspecified   . Skin cancer   . Systolic heart failure     echo 10/10/10 EF 30%  . Hiatal hernia   . Gastritis   . COPD (chronic obstructive pulmonary disease)   . Lower extremity ulceration     dressing changes daily    Assessment:  79 yo female patient known to pharmacy for TPN and other abx dosing this admission   Start Zosyn per pharmacy dosing per Md order for treatment of diverticulitis  Stable labs  Goal of Therapy:  eradication of infection/adjustment per renal function  Plan:  Zosyn 3.375g IV q8 (extended  interval infusion) for CrCl > 20 ml/min   Hessie Knows, PharmD, BCPS Pager [phone] 03/10/2012 8:50 AM

## 2012-03-10 NOTE — Progress Notes (Addendum)
[facility] PROGRESS NOTE  Katie Galvan ZOX:[id number] DOB: 02-17-1927 DOA: 02/29/2012 PCP: Michele Mcalpine, MD  Brief narrative: 79 year old female with past medical history significant for diverticulitis, ischemic cardiomyopathy (EF 30%), perimyocarditis, COPD, endocarditis, recurrent falls who presented to [facility] ED 02/29/2012 due to abdominal pain, nausea and vomiting. In ED, based on CT abd/pelvis patient was found to have sigmoid diverticulitis worse compared to previous imaging studies. Surgery and GI are assisting management. Per GI and surgery recommendations, patient has had slow improvement with conservative management involving NPO and antibiotics so even with high level of risk patient may benefit from surgery. Patient is still refusing surgical intervention at this time.  Assessment and Plan:  Principal Problem:   *Acute Sigmoid Diverticulitis  Still with abdominal pain on physical exam. Patient is on TNA and antibiotics: fluconazole and zosyn.  Analgesia with morphine 4 mg Q 3 hours IV PRN severe pain  Zofran 8 mg Q 6 hours IV scheduled and Phenergan 12.5 mg Q 6 hours IV PRN nausea or vomiting  Appreciate surgery following. Awaiting final decision by patient in regards to further interventions Active Problems:   UTI (lower urinary tract infection)  Secondary to E.Coli  Patient has completed 4 days of ceftriaxone Chronic respiratory failure with hypoxia  Secondary to COPD  Stable at this time  Continue symbicort  Albuterol Q 2 hours PRN Chronic Systolic and Diastolic CHF  2 D ECHO on this admission with EF 55%  Follow up BNP on this admission  Apprecaite cardiology following Pancreatic mass   stable and being followed by GI.  Bilateral lower extremity ulcers  Stable, daily dressing changes Leukocytosis  Related to sigmoid diverticulitis  Management as above Iron deficiency anemia  Hemoglobin stable at 8.8  GI following Moderate protein calorie  malnutrition  On TNA  Code Status: DNR/DNI Family Communication: no family at bedside Disposition Plan: home when stable  Consultants:  Dr. Arlyce Dice ( GI)  Dr. Ezzard Standing (surgery)  Dr. Molli Knock (Pulm) Procedures:  none Antibiotics:  Fluconazole Yetta Barre, MD  [facility] Pager [phone]  If 7PM-7AM, please contact night-coverage [email or web address] Password [email or web address] 03/10/2012, 12:11 PM   LOS: 10 days    HPI/Subjective: No acute overnight events.  Objective: Filed Vitals:   03/09/12 2132 03/09/12 2147 03/10/12 0617 03/10/12 0912  BP:  114/56 128/66   Pulse:  88 82   Temp:  97.2 F (36.2 C) 98 F (36.7 C)   TempSrc:  Oral Oral   Resp: 20 18 20    Height:      Weight:   63.2 kg (139 lb 5.3 oz)   SpO2:  99% 99% 99%    Intake/Output Summary (Last 24 hours) at 03/10/12 1211 Last data filed at 03/10/12 1100  Gross per 24 hour  Intake 522.33 ml  Output   2926 ml  Net -2403.67 ml    Exam:   General:  Pt is alert, follows commands appropriately, not in acute distress  Cardiovascular: Regular rate and rhythm, S1/S2, no murmurs, no rubs, no gallops  Respiratory: Clear to auscultation bilaterally, no wheezing, no crackles, no rhonchi  Abdomen: Soft, tender in left lower quadrant, no rebound, bowel sounds present, no guarding  Extremities: No edema, pulses DP and PT palpable bilaterally  Neuro: Grossly nonfocal  Data Reviewed: Basic Metabolic Panel:  Recent Labs Lab 03/06/12 0451 03/07/12 0354 03/08/12 0535 03/09/12 0445 03/10/12 0505  NA 138 136 134* 135 132*  K 3.8 3.8 4.5 4.6 4.5  CL 101  99 98 100 97  CO2 30 32 33* 32 29  GLUCOSE 152* 116* 129* 149* 138*  BUN 11 11 16 20 23   CREATININE 0.84 0.78 0.75 0.79 0.78  CALCIUM 8.3* 8.6 8.6 8.7 9.0  MG 1.7 1.9  --   --  2.2  PHOS 2.1* 2.7  --   --  3.2   Liver Function Tests:  Recent Labs Lab 03/06/12 0451 03/07/12 0354 03/08/12 0535 03/09/12 0445 03/10/12 0505  AST 17 15 11 14 22   ALT 11 10 8 7 14    ALKPHOS 70 62 53 62 73  BILITOT 0.4 0.3 0.3 0.2* 0.4  PROT 5.6* 5.4* 5.0* 5.6* 5.9*  ALBUMIN 2.2* 2.3* 2.0* 2.1* 2.2*   No results found for this basename: LIPASE, AMYLASE,  in the last 168 hours No results found for this basename: AMMONIA,  in the last 168 hours CBC:  Recent Labs Lab 03/06/12 0451 03/07/12 0354 03/08/12 0535 03/09/12 0445 03/10/12 0505  WBC 15.5* 15.3* 13.9* 14.6* 14.5*  NEUTROABS 12.4*  --   --   --   --   HGB 8.7* 8.9* 8.2* 8.4* 8.8*  HCT 28.0* 27.7* 25.7* 26.8* 27.7*  MCV 85.6 85.5 85.7 85.9 86.0  PLT 318 318 348 432* 530*   Cardiac Enzymes: No results found for this basename: CKTOTAL, CKMB, CKMBINDEX, TROPONINI,  in the last 168 hours BNP: No components found with this basename: POCBNP,  CBG:  Recent Labs Lab 03/09/12 1707 03/09/12 2027 03/10/12 0026 03/10/12 0327 03/10/12 0852  GLUCAP 138* 142* 140* 155* 131*    URINE CULTURE     Status: None   Collection Time    02/29/12  3:51 PM      Result Value Range Status   Specimen Description URINE, CLEAN CATCH   Final   Special Requests NONE   Final   Culture  Setup Time 03/01/2012 01:39   Final   Colony Count >=100,000 COLONIES/ML   Final   Culture ESCHERICHIA COLI   Final   Report Status 03/02/2012 FINAL   Final   Organism ID, Bacteria ESCHERICHIA COLI   Final  CLOSTRIDIUM DIFFICILE BY PCR     Status: None   Collection Time    03/03/12  1:27 AM      Result Value Range Status   C difficile by pcr NEGATIVE  NEGATIVE Final  STOOL CULTURE     Status: None   Collection Time    03/08/12  6:46 AM      Result Value Range Status   Specimen Description STOOL   Final   Special Requests Normal   Final   Culture NO SUSPICIOUS COLONIES, CONTINUING TO HOLD   Final   Report Status PENDING   Incomplete     Studies: No results found.  Scheduled Meds: . budesonide-formoterol  1 puff Inhalation BID  . chlorhexidine  15 mL Mouth Rinse BID  . fluconazole (DIFLUCAN)   200 mg Intravenous Q24H  .  heparin  5,000 Units Subcutaneous Q8H  . insulin aspart  0-9 Units Subcutaneous Q4H  . ondansetron (ZOFRAN)   8 mg Intravenous Q6H  . piperacillin-tazobactam   3.375 g Intravenous Q8H  . tiotropium  18 mcg Inhalation Daily   Continuous Infusions: . sodium chloride 20 mL/hr at 03/09/12 0017  . sodium chloride 20 mL/hr at 03/09/12 1705  . TPN (CLINIMIX) +/- additives 70 mL/hr at 03/09/12 1724  . TPN (CLINIMIX) +/- additives

## 2012-03-11 DIAGNOSIS — R1032 Left lower quadrant pain: Secondary | ICD-10-CM

## 2012-03-11 DIAGNOSIS — I340 Nonrheumatic mitral (valve) insufficiency: Secondary | ICD-10-CM | POA: Diagnosis present

## 2012-03-11 DIAGNOSIS — I059 Rheumatic mitral valve disease, unspecified: Secondary | ICD-10-CM

## 2012-03-11 DIAGNOSIS — R933 Abnormal findings on diagnostic imaging of other parts of digestive tract: Secondary | ICD-10-CM | POA: Diagnosis present

## 2012-03-11 LAB — CBC
MCH: 27.2 pg (ref 26.0–34.0)
MCHC: 31.2 g/dL (ref 30.0–36.0)
MCV: 87.4 fL (ref 78.0–100.0)
Platelets: 528 10*3/uL — ABNORMAL HIGH (ref 150–400)
RDW: 17.6 % — ABNORMAL HIGH (ref 11.5–15.5)

## 2012-03-11 LAB — COMPREHENSIVE METABOLIC PANEL
ALT: 13 U/L (ref 0–35)
AST: 15 U/L (ref 0–37)
Albumin: 2.1 g/dL — ABNORMAL LOW (ref 3.5–5.2)
Alkaline Phosphatase: 70 U/L (ref 39–117)
BUN: 26 mg/dL — ABNORMAL HIGH (ref 6–23)
Chloride: 99 mEq/L (ref 96–112)
Potassium: 4.8 mEq/L (ref 3.5–5.1)
Total Bilirubin: 0.4 mg/dL (ref 0.3–1.2)

## 2012-03-11 LAB — DIFFERENTIAL
Basophils Relative: 1 % (ref 0–1)
Eosinophils Absolute: 0.5 10*3/uL (ref 0.0–0.7)
Monocytes Absolute: 1 10*3/uL (ref 0.1–1.0)
Monocytes Relative: 9 % (ref 3–12)

## 2012-03-11 LAB — GLUCOSE, CAPILLARY
Glucose-Capillary: 114 mg/dL — ABNORMAL HIGH (ref 70–99)
Glucose-Capillary: 125 mg/dL — ABNORMAL HIGH (ref 70–99)
Glucose-Capillary: 141 mg/dL — ABNORMAL HIGH (ref 70–99)

## 2012-03-11 MED ORDER — FAT EMULSION 20 % IV EMUL
240.0000 mL | INTRAVENOUS | Status: AC
  Administered 2012-03-11: 240 mL via INTRAVENOUS
  Filled 2012-03-11: qty 250

## 2012-03-11 MED ORDER — TRACE MINERALS CR-CU-F-FE-I-MN-MO-SE-ZN IV SOLN
INTRAVENOUS | Status: AC
  Administered 2012-03-11: 17:00:00 via INTRAVENOUS
  Filled 2012-03-11: qty 2000

## 2012-03-11 MED ORDER — ALBUTEROL SULFATE (5 MG/ML) 0.5% IN NEBU
2.5000 mg | INHALATION_SOLUTION | Freq: Four times a day (QID) | RESPIRATORY_TRACT | Status: DC | PRN
  Administered 2012-03-15: 2.5 mg via RESPIRATORY_TRACT
  Filled 2012-03-11: qty 0.5
  Filled 2012-03-11: qty 1

## 2012-03-11 NOTE — Progress Notes (Signed)
    Subjective:  Complains of abdominal discomfort this morning. No chest pain or dyspnea.  Objective:  Vital Signs in the last 24 hours: Temp:  [98.4 F (36.9 C)-98.6 F (37 C)] 98.4 F (36.9 C) (03/10 0525) Pulse Rate:  [80-84] 84 (03/10 0525) Resp:  [15-20] 18 (03/10 0525) BP: (106-126)/(53-67) 126/60 mmHg (03/10 0525) SpO2:  [95 %-100 %] 95 % (03/10 0746) Weight:  [62.6 kg (138 lb 0.1 oz)] 62.6 kg (138 lb 0.1 oz) (03/10 0525)  Intake/Output from previous day: 03/09 0701 - 03/10 0700 In: 2545.2 [I.V.:367.7; IV Piggyback:550; TPN:1627.5] Out: 2500 [Urine:2500]  Physical Exam: Pt is alert and oriented, nice elderly woman in NAD HEENT: normal Neck: JVP - normal Lungs: CTA bilaterally CV: RRR with grade 2/6 systolic murmur best heard at the left sternal border Abd: soft, mild diffuse tenderness Ext: no edema Skin: warm/dry no rash  Lab Results:  Recent Labs  03/10/12 0505 03/11/12 0700  WBC 14.5* 11.1*  HGB 8.8* 8.2*  PLT 530* 528*    Recent Labs  03/09/12 0445 03/10/12 0505  NA 135 132*  K 4.6 4.5  CL 100 97  CO2 32 29  GLUCOSE 149* 138*  BUN 20 23  CREATININE 0.79 0.78   No results found for this basename: TROPONINI, CK, MB,  in the last 72 hours  2-D echo: Study Conclusions  - Left ventricle: The cavity size was normal. Wall thickness was increased in a pattern of mild LVH. Systolic function was normal. The estimated ejection fraction was in the range of 55% to 60%. - Mitral valve: Severe posterior MAC. Likely prolapse Eccentric MR likely moderate to severe - Left atrium: The atrium was moderately dilated. - Atrial septum: No defect or patent foramen ovale was identified. - Pulmonary arteries: PA peak pressure: 58mm Hg (S).  Assessment/Plan:  1. Moderate to severe mitral regurgitation. The patient remained stable from a cardiac perspective. She has no signs of congestive heart failure. This does not preclude surgery as needed. She should be  followed as an outpatient at some point by cardiology. However, considering her wishes and reluctance to proceed with surgery, I highly doubt she would ever be considered for cardiac surgery if she became symptomatic from her mitral regurgitation.  2. History of myopericarditis. No current symptoms.  Please feel free to call back if any cardiac issues arise. I will arrange outpatient cardiology followup.   Tonny Bollman, M.D. 03/11/2012, 8:02 AM

## 2012-03-11 NOTE — Progress Notes (Addendum)
[facility] PROGRESS NOTE  Katie Galvan JXB:[id number] DOB: 1927-01-08 DOA: 02/29/2012 PCP: Michele Mcalpine, MD  Brief narrative: 79 year old female with past medical history significant for diverticulitis, ischemic cardiomyopathy (EF 30%), perimyocarditis, COPD, endocarditis, recurrent falls who presented to [facility] ED 02/29/2012 due to abdominal pain, nausea and vomiting. In ED, evaluation included CT abd/pelvis with findings significant for sigmoid diverticulitis worse compared to previous imaging studies. Surgery and GI are assisting management. Per GI and surgery recommendations, patient has had slow improvement with conservative management involving NPO and antibiotics so even with high level of risk patient may benefit from surgery. Patient will talk to her son and decide on if they want to proceed with surgical intervention which would include colectomy and colostomy.  Assessment and Plan:   Principal Problem:  *Acute Sigmoid Diverticulitis  Abdominal pain is improving. Continue TNA and antibiotics, Fluconazole and Zosyn. White blood cell count trending down so it would be reasonable to continue the same antibiotic regimen. Appreciate surgery following  Analgesia with morphine 4 mg Q 3 hours IV PRN severe pain  Zofran 8 mg Q 6 hours IV scheduled and Phenergan 12.5 mg Q 6 hours IV PRN nausea or vomiting   Active Problems:  UTI (lower urinary tract infection)  Secondary to E.Coli  Patient has completed 4 days of ceftriaxone Chronic respiratory failure with hypoxia  Secondary to COPD  Respiratory status stable Continue symbicort  Albuterol Q 6 hours PRN Chronic Systolic and Diastolic CHF  2 D ECHO on this admission with EF 55% Follow up BNP on this admission - slightly elevated at 278 Apprecaite cardiology following Pancreatic mass  Stable and being followed by GI.  Bilateral lower extremity ulcers  Stable, daily dressing changes Leukocytosis  Related to sigmoid  diverticulitis. White blood cell count trending down with current antibiotics. Management as above Iron deficiency anemia  Hemoglobin stable at 8.2 GI following Moderate protein calorie malnutrition  On TNA   Code Status: DNR/DNI  Family Communication: no family at bedside  Disposition Plan: home when stable   Consultants:  Dr. Arlyce Dice ( GI)  Dr. Ezzard Standing (Surgery)  Dr. Molli Knock (Pulm) Dr. Excell Seltzer (Cardiology) Procedures:  none Antibiotics:  Fluconazole  Yetta Barre, MD  [facility]  Pager [phone]   If 7PM-7AM, please contact night-coverage [email or web address] Password [email or web address] 03/11/2012, 11:23 AM   LOS: 11 days    HPI/Subjective: No acute overnight events.  Objective: Filed Vitals:   03/10/12 2118 03/10/12 2132 03/11/12 0525 03/11/12 0746  BP:  106/53 126/60   Pulse: 82 82 84   Temp:  98.6 F (37 C) 98.4 F (36.9 C)   TempSrc:  Axillary Oral   Resp: 20 18 18    Height:      Weight:   62.6 kg (138 lb 0.1 oz)   SpO2: 95% 100% 98% 95%    Intake/Output Summary (Last 24 hours) at 03/11/12 1123 Last data filed at 03/11/12 0700  Gross per 24 hour  Intake 2395.17 ml  Output   2050 ml  Net 345.17 ml    Exam:   General:  Pt is alert, follows commands appropriately, not in acute distress  Cardiovascular: Regular rate and rhythm, S1/S2, no murmurs, no rubs, no gallops  Respiratory: Clear to auscultation bilaterally, no wheezing, no crackles, no rhonchi  Abdomen: Soft, tender in left lower quadrant, non distended, bowel sounds present, no guarding  Extremities: No edema, pulses DP and PT palpable bilaterally  Neuro: Grossly nonfocal  Data Reviewed: Basic Metabolic  Panel:  Recent Labs Lab 03/06/12 0451 03/07/12 0354 03/08/12 0535 03/09/12 0445 03/10/12 0505 03/11/12 0700  NA 138 136 134* 135 132* 136  K 3.8 3.8 4.5 4.6 4.5 4.8  CL 101 99 98 100 97 99  CO2 30 32 33* 32 29 30  GLUCOSE 152* 116* 129* 149* 138* 113*  BUN 11 11 16 20 23  26*  CREATININE  0.84 0.78 0.75 0.79 0.78 0.90  CALCIUM 8.3* 8.6 8.6 8.7 9.0 8.9  MG 1.7 1.9  --   --  2.2 2.3  PHOS 2.1* 2.7  --   --  3.2 3.6   Liver Function Tests:  Recent Labs Lab 03/07/12 0354 03/08/12 0535 03/09/12 0445 03/10/12 0505 03/11/12 0700  AST 15 11 14 22 15   ALT 10 8 7 14 13   ALKPHOS 62 53 62 73 70  BILITOT 0.3 0.3 0.2* 0.4 0.4  PROT 5.4* 5.0* 5.6* 5.9* 5.8*  ALBUMIN 2.3* 2.0* 2.1* 2.2* 2.1*   CBC:  Recent Labs Lab 03/06/12 0451 03/07/12 0354 03/08/12 0535 03/09/12 0445 03/10/12 0505 03/11/12 0700  WBC 15.5* 15.3* 13.9* 14.6* 14.5* 11.1*  NEUTROABS 12.4*  --   --   --   --  7.8*  HGB 8.7* 8.9* 8.2* 8.4* 8.8* 8.2*  HCT 28.0* 27.7* 25.7* 26.8* 27.7* 26.3*  MCV 85.6 85.5 85.7 85.9 86.0 87.4  PLT 318 318 348 432* 530* 528*   CBG:  Recent Labs Lab 03/10/12 1737 03/10/12 2023 03/11/12 0004 03/11/12 0347 03/11/12 0736  GLUCAP 125* 113* 141* 125* 121*    CLOSTRIDIUM DIFFICILE BY PCR     Status: None   Collection Time    03/03/12  1:27 AM      Result Value Range Status   C difficile by pcr NEGATIVE  NEGATIVE Final  STOOL CULTURE     Status: None   Collection Time    03/08/12  6:46 AM      Result Value Range Status   Specimen Description STOOL   Final   Special Requests Normal   Final   Culture     Final   Value: NO SUSPICIOUS COLONIES, CONTINUING TO HOLD     Note: REDUCED NORMAL FLORA PRESENT   Report Status PENDING   Incomplete     Studies: Ct Abdomen Pelvis W Contrast 03/11/2012    IMPRESSION:  1. Chronic bowel wall thickening and inflammation through the proximal sigmoid colon  which may be slightly improved compared to most recent exam.  Differential includes chronic diverticulitis, inflammatory bowel disease and cannot exclude neoplasm.  Recommend follow up imaging or colonoscopy. 2.  Small amount free fluid the pelvis is slightly increased. 3.  Slight improvement in right pleural effusion.  .     Scheduled Meds: . budesonide-formoterol  1 puff  Inhalation BID  . fluconazole (DIFLUCAN)   200 mg Intravenous Q24H  . heparin  5,000 Units Subcutaneous Q8H  . insulin aspart  0-9 Units Subcutaneous Q4H  . ondansetron (ZOFRAN)   8 mg Intravenous Q6H  . piperacillin-tazobactam  3.375 g Intravenous Q8H  . tiotropium  18 mcg Inhalation Daily   Continuous Infusions: . sodium chloride 20 mL/hr at 03/09/12 0017  . sodium chloride 20 mL/hr at 03/10/12 2142  . fat emulsion    . TPN (CLINIMIX) +/- additives 70 mL/hr at 03/10/12 1720  . TPN (CLINIMIX) +/- additives

## 2012-03-11 NOTE — Progress Notes (Signed)
Patient ID: Katie Galvan, female   DOB: [birth year], 79 y.o.   MRN: [id number]   Subjective: Pt reports symptoms same today unfortunately, is more open to surgery at this point, pain still moderate to severe with palp.  Denies n/v  Objective: Vital signs in last 24 hours: Temp:  [98.4 F (36.9 C)-98.6 F (37 C)] 98.4 F (36.9 C) (03/10 0525) Pulse Rate:  [80-84] 84 (03/10 0525) Resp:  [15-20] 18 (03/10 0525) BP: (106-126)/(53-67) 126/60 mmHg (03/10 0525) SpO2:  [95 %-100 %] 95 % (03/10 0746) Weight:  [138 lb 0.1 oz (62.6 kg)] 138 lb 0.1 oz (62.6 kg) (03/10 0525) Last BM Date: 03/10/12  Intake/Output from previous day: 03/09 0701 - 03/10 0700 In: 2545.2 [I.V.:367.7; IV Piggyback:550; TPN:1627.5] Out: 2500 [Urine:2500] Intake/Output this shift:    PE: Abd: soft, tender in LLQ General: awake, alert, On O2, appears chronically ill but not acute.  Lab Results:   Recent Labs  03/10/12 0505 03/11/12 0700  WBC 14.5* 11.1*  HGB 8.8* 8.2*  HCT 27.7* 26.3*  PLT 530* 528*   BMET  Recent Labs  03/10/12 0505 03/11/12 0700  NA 132* 136  K 4.5 4.8  CL 97 99  CO2 29 30  GLUCOSE 138* 113*  BUN 23 26*  CREATININE 0.78 0.90  CALCIUM 9.0 8.9   PT/INR No results found for this basename: LABPROT, INR,  in the last 72 hours CMP     Component Value Date/Time   NA 136 03/11/2012 0700   NA 139 11/27/2011 1533   K 4.8 03/11/2012 0700   K 4.1 11/27/2011 1533   CL 99 03/11/2012 0700   CL 100 11/27/2011 1533   CO2 30 03/11/2012 0700   CO2 31* 11/27/2011 1533   GLUCOSE 113* 03/11/2012 0700   GLUCOSE 97 11/27/2011 1533   BUN 26* 03/11/2012 0700   BUN 22.0 11/27/2011 1533   CREATININE 0.90 03/11/2012 0700   CREATININE 0.9 11/27/2011 1533   CALCIUM 8.9 03/11/2012 0700   CALCIUM 10.0 11/27/2011 1533   PROT 5.8* 03/11/2012 0700   PROT 7.1 11/27/2011 1533   ALBUMIN 2.1* 03/11/2012 0700   ALBUMIN 3.4* 11/27/2011 1533   AST 15 03/11/2012 0700   AST 16 11/27/2011 1533   ALT 13  03/11/2012 0700   ALT 11 11/27/2011 1533   ALKPHOS 70 03/11/2012 0700   ALKPHOS 73 11/27/2011 1533   BILITOT 0.4 03/11/2012 0700   BILITOT 0.34 11/27/2011 1533   GFRNONAA 58* 03/11/2012 0700   GFRAA 67* 03/11/2012 0700   Lipase     Component Value Date/Time   LIPASE 8* 11/08/2011 1539       Studies/Results: Ct Abdomen Pelvis W Contrast  03/11/2012  *RADIOLOGY REPORT*  Clinical Data: Persistent diverticulitis  CT ABDOMEN AND PELVIS WITH CONTRAST  Technique:  Multidetector CT imaging of the abdomen and pelvis was performed following the standard protocol during bolus administration of intravenous contrast.  Contrast: 50mL OMNIPAQUE IOHEXOL 300 MG/ML  SOLN, OMNIPAQUE IOHEXOL 300 MG/ML  SOLN  Comparison: CT 03/04/2012, 02/27 1014, 10/30/2028  Findings: Seen there is small right effusion with basilar atelectasis which is slightly improved compared to prior.  No focal hepatic lesion entire liver is not imaged.  Gallbladder, pancreas, spleen, adrenal glands, and kidneys are unchanged.  There is a nonobstructing calculus in the left kidney.  Low density cysts within the left and right kidney.  The stomach and small bowel are normal.  No evidence of bowel obstruction.  There is persistent  bowel wall thickening and inflammation in the proximal sigmoid colon over approximately 10 cm segment. There may be slight improvement in the inflammation in the fat surrounding the sigmoid colon.  There is still persistent significant inflammation and bowel wall thickening.  There is small amount fat within the wall which suggest a chronic component.  A small gas collection to the superior the colon immediately adjacent to the serosal surface either represents  a diverticulum or contained perforation, favor diverticulum.  This is seen on image 56 and is not changed.  There is a small amount fluid surrounding the rectum which is slightly increased compared to prior.  Abdominal aorta normal caliber.  No retroperitoneal  periportal lymphadenopathy.  There is small amount free fluid in the pelvis slightly increased. Post hysterectomy anatomy.  The bladder is normal.  No pelvic lymphadenopathy.  IMPRESSION:  1. Chronic bowel wall thickening and inflammation through the proximal sigmoid colon  which may be slightly improved compared to most recent exam.  Differential includes chronic diverticulitis, inflammatory bowel disease and cannot exclude neoplasm.  Recommend follow up imaging or colonoscopy. 2.  Small amount free fluid the pelvis is slightly increased. 3.  Slight improvement in right pleural effusion.   Original Report Authenticated By: Genevive Bi, M.D.     Anti-infectives: Anti-infectives   Start     Dose/Rate Route Frequency Ordered Stop   03/10/12 1000  piperacillin-tazobactam (ZOSYN) IVPB 3.375 g     3.375 g 12.5 mL/hr over 240 Minutes Intravenous Every 8 hours 03/10/12 0851     03/10/12 0815  piperacillin-tazobactam (ZOSYN) IVPB 3.375 g  Status:  Discontinued     3.375 g 12.5 mL/hr over 240 Minutes Intravenous Every 8 hours 03/10/12 0807 03/10/12 0851   03/10/12 0815  fluconazole (DIFLUCAN) IVPB 200 mg     200 mg 100 mL/hr over 60 Minutes Intravenous Every 24 hours 03/10/12 0807     03/06/12 1200  ertapenem (INVANZ) 1 g in sodium chloride 0.9 % 50 mL IVPB  Status:  Discontinued     1 g 100 mL/hr over 30 Minutes Intravenous Every 24 hours 03/06/12 1059 03/10/12 0807   03/03/12 1200  cefTRIAXone (ROCEPHIN) 1 g in dextrose 5 % 50 mL IVPB  Status:  Discontinued     1 g 100 mL/hr over 30 Minutes Intravenous Every 24 hours 03/03/12 0958 03/06/12 1059   02/29/12 2000  metroNIDAZOLE (FLAGYL) IVPB 500 mg  Status:  Discontinued     500 mg 100 mL/hr over 60 Minutes Intravenous Every 8 hours 02/29/12 1919 03/06/12 1059   02/29/12 2000  ciprofloxacin (CIPRO) IVPB 400 mg  Status:  Discontinued     400 mg 200 mL/hr over 60 Minutes Intravenous Every 12 hours 02/29/12 1925 03/03/12 0958   02/29/12 1745   cefTRIAXone (ROCEPHIN) 1 g in dextrose 5 % 50 mL IVPB     1 g 100 mL/hr over 30 Minutes Intravenous  Once 02/29/12 1744 02/29/12 1859   02/29/12 1745  metroNIDAZOLE (FLAGYL) IVPB 500 mg  Status:  Discontinued     500 mg 100 mL/hr over 60 Minutes Intravenous  Once 02/29/12 1744 02/29/12 1929   02/29/12 1745  ciprofloxacin (CIPRO) IVPB 400 mg  Status:  Discontinued     400 mg 200 mL/hr over 60 Minutes Intravenous  Once 02/29/12 1744 02/29/12 1928       Assessment/Plan 1. Diverticulitis vs colitis of sigmoid colon: CT scan - 03/04/2012 - chronic sigmoid colon thickening over about 10 cm (similar to CT  scan 03/01/2011 and may be a little worse than 10/31/2011),  WBC a little better today, continue TNA, cont NPO.  Will recheck cbc tom.  Unfortunately the patient is really not improving clinically, she is more open to surgical intervention which seems to be the way we heading, will have Dr. Carolynne Edouard speak to her later today about having surgery      LOS: 11 days    Annsleigh Dragoo 03/11/2012

## 2012-03-11 NOTE — Consult Note (Signed)
PULMONARY  / CRITICAL CARE MEDICINE  Name: Katie Galvan MRN: [id number] DOB: 16-Dec-1927    ADMISSION DATE:  02/29/2012 CONSULTATION DATE:  3/5  REFERRING MD :  Surgery  CHIEF COMPLAINT:  Pulmonary clearance for surgery.  BRIEF PATIENT DESCRIPTION:  79 yo WF never smoker, O2 dependent x 3 years, Katie Galvan PCP,  Who needs general surgery for suspected perforated bowel from diverticulitis with bowel resection and /or colostomy. PCCM asked to evaluate for pulmonary clearance.  SIGNIFICANT EVENTS / STUDIES:    LINES / TUBES:   CULTURES:   ANTIBIOTICS: Invanz 3/5 >>>3/9 Flagyl 2/27>>>3/5  Zosyn 3/9>>>  SUBJECTIVE: Patient and family continue to discuss if she should have abdominal surgery.  No decisions made at this time.  Continues to complain of abdominal pain.    VITAL SIGNS: Temp:  [98.4 F (36.9 C)-98.7 F (37.1 C)] 98.7 F (37.1 C) (03/10 1315) Pulse Rate:  [82-87] 87 (03/10 1315) Resp:  [17-20] 17 (03/10 1315) BP: (106-126)/(53-71) 121/71 mmHg (03/10 1315) SpO2:  [95 %-100 %] 98 % (03/10 1315) Weight:  [138 lb 0.1 oz (62.6 kg)] 138 lb 0.1 oz (62.6 kg) (03/10 0525)  PHYSICAL EXAMINATION: General:  Frail but mentally sharp Neuro:  Intact HEENT:  No LAN Neck:  No JVD Cardiovascular:  HSR RRR Lungs:  Decreased bs bases Abdomen:  Tender , on tna Musculoskeletal:  Lt wrist deformed from remote surgery Skin:  Warm with mild le edema   Recent Labs Lab 03/09/12 0445 03/10/12 0505 03/11/12 0700  NA 135 132* 136  K 4.6 4.5 4.8  CL 100 97 99  CO2 32 29 30  BUN 20 23 26*  CREATININE 0.79 0.78 0.90  GLUCOSE 149* 138* 113*    Recent Labs Lab 03/09/12 0445 03/10/12 0505 03/11/12 0700  HGB 8.4* 8.8* 8.2*  HCT 26.8* 27.7* 26.3*  WBC 14.6* 14.5* 11.1*  PLT 432* 530* 528*   Ct Abdomen Pelvis W Contrast  03/11/2012  *RADIOLOGY REPORT*  Clinical Data: Persistent diverticulitis  CT ABDOMEN AND PELVIS WITH CONTRAST  Technique:  Multidetector CT imaging of  the abdomen and pelvis was performed following the standard protocol during bolus administration of intravenous contrast.  Contrast: 50mL OMNIPAQUE IOHEXOL 300 MG/ML  SOLN, OMNIPAQUE IOHEXOL 300 MG/ML  SOLN  Comparison: CT 03/04/2012, 02/27 1014, 10/30/2028  Findings: Seen there is small right effusion with basilar atelectasis which is slightly improved compared to prior.  No focal hepatic lesion entire liver is not imaged.  Gallbladder, pancreas, spleen, adrenal glands, and kidneys are unchanged.  There is a nonobstructing calculus in the left kidney.  Low density cysts within the left and right kidney.  The stomach and small bowel are normal.  No evidence of bowel obstruction.  There is persistent bowel wall thickening and inflammation in the proximal sigmoid colon over approximately 10 cm segment. There may be slight improvement in the inflammation in the fat surrounding the sigmoid colon.  There is still persistent significant inflammation and bowel wall thickening.  There is small amount fat within the wall which suggest a chronic component.  A small gas collection to the superior the colon immediately adjacent to the serosal surface either represents  a diverticulum or contained perforation, favor diverticulum.  This is seen on image 56 and is not changed.  There is a small amount fluid surrounding the rectum which is slightly increased compared to prior.  Abdominal aorta normal caliber.  No retroperitoneal periportal lymphadenopathy.  There is small amount free fluid in  the pelvis slightly increased. Post hysterectomy anatomy.  The bladder is normal.  No pelvic lymphadenopathy.  IMPRESSION:  1. Chronic bowel wall thickening and inflammation through the proximal sigmoid colon  which may be slightly improved compared to most recent exam.  Differential includes chronic diverticulitis, inflammatory bowel disease and cannot exclude neoplasm.  Recommend follow up imaging or colonoscopy. 2.  Small amount free  fluid the pelvis is slightly increased. 3.  Slight improvement in right pleural effusion.   Original Report Authenticated By: Genevive Bi, M.D.     ASSESSMENT / PLAN: Pulmonary clearance for abdominal surgery for presumed perforation. Her pulmonary status does not preclude surgery but does place her at higher risk for post operative pulmonary complications that includes inability to liberate from ventilator.  She appears willing to take this risk but does not want prolonged vent support and tracheostomy. Vent LTAC / trach / PEG is not an option per her request.     Plan: -continue baseline oxygen at 2L  -pulmonary hygiene -mobilize as able -abx per primary SVC -pain control -please notify PCCM if decision is made for surgery.  We will continue to follow prn .    Thank you.    Shan Levans MD Beeper  [phone]  Cell  [phone]  If no response or cell goes to voicemail, call beeper [phone]

## 2012-03-11 NOTE — Progress Notes (Signed)
OT Cancellation Note  Patient Details Name: Aalani Aikens MRN: [id number] DOB: 1927-10-25   Cancelled Treatment:    Reason Eval/Treat Not Completed: Other (comment)  Pt up in chair and adls already done this am.  Will check another day  SPENCER,MARYELLEN 03/11/2012, 10:54 AM [name], OTR/L [phone] 03/11/2012

## 2012-03-11 NOTE — Progress Notes (Signed)
Physical Therapy Treatment Patient Details Name: Katie Galvan MRN: [id number] DOB: 06/11/1927 Today's Date: 03/11/2012 Time: 4098-1191 PT Time Calculation (min): 25 min  PT Assessment / Plan / Recommendation Comments on Treatment Session  Pt ambulated in hallway today and continues to require min-mod assist for mobility.    Follow Up Recommendations  SNF     Does the patient have the potential to tolerate intense rehabilitation     Barriers to Discharge        Equipment Recommendations  None recommended by PT    Recommendations for Other Services    Frequency     Plan Discharge plan remains appropriate;Frequency remains appropriate    Precautions / Restrictions Precautions Precautions: Fall Precaution Comments: O2 dependent   Pertinent Vitals/Pain Pt reports "same" abdominal pain.    Mobility  Bed Mobility Bed Mobility: Supine to Sit Supine to Sit: 3: Mod assist;HOB elevated;With rails Sit to Supine:  (LEs onto bed) Details for Bed Mobility Assistance: assist for trunk, verbal cues for technique Transfers Transfers: Stand to Sit;Sit to Stand Sit to Stand: 4: Min assist;From bed;With upper extremity assist Stand to Sit: 4: Min assist;To chair/3-in-1;With upper extremity assist Stand Pivot Transfers: 4: Min assist Details for Transfer Assistance:  verbal cues for hand placement and assist to rise and control descent, pt assisted to [facility] with RW prior to ambulation today Ambulation/Gait Ambulation/Gait Assistance: 4: Min assist Ambulation Distance (Feet): 40 Feet Assistive device: Rolling walker Ambulation/Gait Assistance Details: verbal cues for safe use of RW, ambulated on 2L [place], pt reports weakness and fatigue increased today since she remained in bed over weekend. Gait Pattern: Step-to pattern;Trunk flexed Gait velocity: decreased, very slow pace today    Exercises     PT Diagnosis:    PT Problem List:   PT Treatment Interventions:     PT Goals Acute  Rehab PT Goals PT Goal: Supine/Side to Sit - Progress: Progressing toward goal PT Goal: Sit to Stand - Progress: Progressing toward goal Pt will Ambulate: 16 - 50 feet;with rolling walker;with supervision PT Goal: Ambulate - Progress: Progressing toward goal  Visit Information  Last PT Received On: 03/11/12 Assistance Needed: +2    Subjective Data  Subjective: I haven't been out of the bed this weekend.   Cognition  Cognition Overall Cognitive Status: Appears within functional limits for tasks assessed/performed Arousal/Alertness: Awake/alert Orientation Level: Appears intact for tasks assessed Behavior During Session: [facility] for tasks performed    Balance     End of Session PT - End of Session Equipment Utilized During Treatment: Oxygen Activity Tolerance: Patient limited by fatigue Patient left: in chair;with chair alarm set;with call bell/phone within reach   GP     LEMYRE,[name] 03/11/2012, 11:07 AM Zenovia Jarred, PT, DPT 03/11/2012 Pager: [phone]

## 2012-03-11 NOTE — Progress Notes (Signed)
PARENTERAL NUTRITION CONSULT NOTE - FOLLOW UP  Pharmacy Consult for TNA Indication: Intolerance to enteral feeding (Diverticulitis vs colitis of sigmoid colon)  Allergies  Allergen Reactions  . Sulfonamide Derivatives Swelling    Patient Measurements: Height: 5' (152.4 cm) Weight: 138 lb 0.1 oz (62.6 kg) IBW/kg (Calculated) : 45.5 Usual Weight: 65 kg  Vital Signs: Temp: 98.4 F (36.9 C) (03/10 0525) Temp src: Oral (03/10 0525) BP: 126/60 mmHg (03/10 0525) Pulse Rate: 84 (03/10 0525) Intake/Output from previous day: 03/09 0701 - 03/10 0700 In: 2545.2 [I.V.:367.7; IV Piggyback:550; TPN:1627.5] Out: 2500 [Urine:2500] Intake/Output from this shift:    Labs:  Recent Labs  03/09/12 0445 03/10/12 0505 03/11/12 0700  WBC 14.6* 14.5* 11.1*  HGB 8.4* 8.8* 8.2*  HCT 26.8* 27.7* 26.3*  PLT 432* 530* 528*     Recent Labs  03/09/12 0445 03/10/12 0505 03/11/12 0700  NA 135 132* 136  K 4.6 4.5 4.8  CL 100 97 99  CO2 32 29 30  GLUCOSE 149* 138* 113*  BUN 20 23 26*  CREATININE 0.79 0.78 0.90  CALCIUM 8.7 9.0 8.9  MG  --  2.2 2.3  PHOS  --  3.2 3.6  PROT 5.6* 5.9* 5.8*  ALBUMIN 2.1* 2.2* 2.1*  AST 14 22 15   ALT 7 14 13   ALKPHOS 62 73 70  BILITOT 0.2* 0.4 0.4   Estimated Creatinine Clearance: 39.8 ml/min (by C-G formula based on Cr of 0.9).    Recent Labs  03/11/12 0004 03/11/12 0347 03/11/12 0736  GLUCAP 141* 125* 121*    Nutritional Goals:  - RD recs per day: 1420-1660 Kcal, 80-94 g protein, 2L fluid.  -Clinimix E5/20 at 26ml/hr + IVFE 20% at 60ml/hr on MWF to provide: 84g/day protein, 1684Kcal/day (Avg 1478Kcal/day STTHS, 1958Kcal/day MWF).  Current nutrition:  - Diet: NPO since 3/3 - TNA: Clinimix E 5/20 @ goal rate of 70 ml/hr - mIVF: NS at 20 ml/hr  CBGs & Insulin requirements past 24 hours:  - CBGs in goal range of <150 - 6 units SSI  Labs: Electrolytes: stable, wnl Renal Function: Scr wnl Hepatic Function: wnl  Pre-Albumin: 7.9 (3/5)   Triglycerides: 80 (3/5)   Assessment:  79 yo M with h/o diveriticulitis presented 2/27 with abd pain, N/V and fatigue. CT abd/pelvis in ED with worsened chronic inflammatory process involving the sigmoid colon. Pt admitted for acute on chronic sigmoiditis, diverticulitis, and UTI. GI on board, pt did not improve with Cipro/Flagyl. Repeat CT 3/3 with chronic sigmoid colon thickening, small area of air either contained within diverticulum or escaped from bowel. Surgery, cardiology and pulmonary consulted and investigating perioperative risk.  TNA ordered to start 3/4 given intolerance to enteral feeding.   - 3/10: Today is TNA Day#7.  TNA is currently at goal rate of 54ml/hr. Cardiology and pulmonary cleared for surgery if needed and Dr. Donell Beers has advised patient that surgery is needed but it is not emergent so patient to think about it further per notes.  Plan continue bowel rest, TNA, abx, repeat imaging in a few days hoping for improvement. Surgery is still a possibility, although looks like patient is refusing. Patient appears to be tolerating TNA at goal rate. Waiting for decision on long-term plans vs TNA.   Plan:    Continue Clinimix E 5/20 at goal rate of 70 ml/hr.   TNA to contain IV fat emulsion 20% at 10 ml/hr on MWF only due to ongoing shortage  Standard multivitamins and trace elements on MWF  only due to ongoing shortage.  Patient not taking anything by mouth so unable to convert MVI to PO at this time.  TNA labs Monday/Thursdays  Awaiting decision on long-term nutrition plan vs surgery.   Darrol Angel, PharmD Pager: [phone] 03/11/2012 8:51 AM

## 2012-03-11 NOTE — Progress Notes (Signed)
CSW continues to follow for d/c to [facility].  Bethany C. Corcoran MSW, LCSW [phone]

## 2012-03-11 NOTE — Progress Notes (Signed)
[facility] Progress Note  Patient Name: Katie Galvan Date of Encounter: 03/11/2012, 12:24 PM    Subjective  Says she is worse today. Stools loose.   Objective    Physical Exam: Filed Vitals:   03/11/12 0525  BP: 126/60  Pulse: 84  Temp: 98.4 F (36.9 C)  Resp: 18   General: elderly NAD Abdomen: soft, moderately tender LLQ Psych:  appropriate     Labs:  Recent Labs  03/10/12 0505 03/11/12 0700  NA 132* 136  K 4.5 4.8  CL 97 99  CO2 29 30  GLUCOSE 138* 113*  BUN 23 26*  CREATININE 0.78 0.90  CALCIUM 9.0 8.9  MG 2.2 2.3  PHOS 3.2 3.6    Recent Labs  03/10/12 0505 03/11/12 0700  AST 22 15  ALT 14 13  ALKPHOS 73 70  BILITOT 0.4 0.4  PROT 5.9* 5.8*  ALBUMIN 2.2* 2.1*     Recent Labs  03/10/12 0505 03/11/12 0700  WBC 14.5* 11.1*  NEUTROABS  --  7.8*  HGB 8.8* 8.2*  HCT 27.7* 26.3*  MCV 86.0 87.4  PLT 530* 528*      Radiology/Studies:   Ct Abdomen Pelvis W Contrast  03/11/2012  *RADIOLOGY REPORT*  Clinical Data: Persistent diverticulitis  CT ABDOMEN AND PELVIS WITH CONTRAST IMPRESSION:  1. Chronic bowel wall thickening and inflammation through the proximal sigmoid colon  which may be slightly improved compared to most recent exam.  Differential includes chronic diverticulitis, inflammatory bowel disease and cannot exclude neoplasm.  Recommend follow up imaging or colonoscopy. 2.  Small amount free fluid the pelvis is slightly increased. 3.  Slight improvement in right pleural effusion.   Original Report Authenticated By: Genevive Bi, M.D.    Ct Abdomen Pelvis W Contrast  03/04/2012  *RADIOLOGY REPORT*  Clinical Data: Left lower quadrant abdominal pain, inflammatory process  CT ABDOMEN AND PELVIS WITH CONTRAST   IMPRESSION: Persistent sigmoid wall thickening with pericolic inflammatory changes, question chronic diverticulitis versus inflammatory bowel disease in patient with diverticulosis. Underlying tumor not excluded; follow  up colonoscopic assessment recommended.  Focal gas collection adjacent to the diseased segment of sigmoid colon may represent a large sigmoid diverticula though a small contained perforation not completely excluded, unchanged. Atrophic kidneys with bilateral cysts and nonobstructing left renal stone. Small amount of nonspecific free intraperitoneal fluid. Right inguinal hernia containing fat.   Original Report Authenticated By: Ulyses Southward, M.D.    Ct Abdomen Pelvis W Contrast  02/29/2012  *RADIOLOGY REPORT*  Clinical Data: Left lower quadrant pain, abdominal pain  CT ABDOMEN AND PELVIS WITH CONTRAST    IMPRESSION:  1.  Exuberant inflammatory process involving the sigmoid colon appears chronic.  This inflammation is present on comparison exam of 10/31/2011 but is worse now.  Differential includes chronic diverticulitis or chronic segmental colitis with differential including inflammatory bowel disease.  No evidence of macroperforation or abscess.  2.  The liver and gallbladder are partially imaged due to elevation of the right hemidiaphragm.   Original Report Authenticated By: Genevive Bi, M.D.       Assessment and Plan  1) Thickened Left colon on CT with LLQ pain and tenderness - diverticulitis vs. Colitis - I do think she could have chronic ischemic injury 2) elderly/comorbid conditions - higher surgical risk 3) has declined surgery thus far 4) on TPN  I am available to do sigmoidoscopy if needed to better understand this process. I did explain to her some. Would most likely need to be prepared  to have surgery or suffer the consequences of perforation without if that is her desire.  Iva Boop, MD, [facility] [phone] (pager) 03/11/2012 12:29 PM

## 2012-03-12 LAB — PREALBUMIN: Prealbumin: 12.7 mg/dL — ABNORMAL LOW (ref 17.0–34.0)

## 2012-03-12 LAB — GLUCOSE, CAPILLARY
Glucose-Capillary: 110 mg/dL — ABNORMAL HIGH (ref 70–99)
Glucose-Capillary: 121 mg/dL — ABNORMAL HIGH (ref 70–99)
Glucose-Capillary: 130 mg/dL — ABNORMAL HIGH (ref 70–99)
Glucose-Capillary: 131 mg/dL — ABNORMAL HIGH (ref 70–99)
Glucose-Capillary: 139 mg/dL — ABNORMAL HIGH (ref 70–99)

## 2012-03-12 LAB — CBC
Hemoglobin: 8.2 g/dL — ABNORMAL LOW (ref 12.0–15.0)
MCH: 27.6 pg (ref 26.0–34.0)
MCHC: 31.7 g/dL (ref 30.0–36.0)
Platelets: 561 10*3/uL — ABNORMAL HIGH (ref 150–400)
RDW: 18 % — ABNORMAL HIGH (ref 11.5–15.5)

## 2012-03-12 LAB — COMPREHENSIVE METABOLIC PANEL
ALT: 23 U/L (ref 0–35)
CO2: 30 mEq/L (ref 19–32)
Calcium: 8.8 mg/dL (ref 8.4–10.5)
Chloride: 99 mEq/L (ref 96–112)
GFR calc Af Amer: 56 mL/min — ABNORMAL LOW (ref >90)
GFR calc non Af Amer: 49 mL/min — ABNORMAL LOW (ref >90)
Glucose, Bld: 129 mg/dL — ABNORMAL HIGH (ref 70–99)
Sodium: 134 mEq/L — ABNORMAL LOW (ref 135–145)
Total Bilirubin: 0.3 mg/dL (ref 0.3–1.2)

## 2012-03-12 LAB — STOOL CULTURE

## 2012-03-12 MED ORDER — CLINIMIX E/DEXTROSE (5/20) 5 % IV SOLN
INTRAVENOUS | Status: AC
  Administered 2012-03-12: 17:00:00 via INTRAVENOUS
  Filled 2012-03-12: qty 2000

## 2012-03-12 MED ORDER — METRONIDAZOLE IN NACL 5-0.79 MG/ML-% IV SOLN
500.0000 mg | Freq: Three times a day (TID) | INTRAVENOUS | Status: DC
  Administered 2012-03-12 – 2012-03-15 (×8): 500 mg via INTRAVENOUS
  Filled 2012-03-12 (×10): qty 100

## 2012-03-12 NOTE — Progress Notes (Signed)
PARENTERAL NUTRITION CONSULT NOTE - FOLLOW UP  Pharmacy Consult for TNA Indication: Intolerance to enteral feeding (Diverticulitis vs colitis of sigmoid colon)  Allergies  Allergen Reactions  . Sulfonamide Derivatives Swelling    Patient Measurements: Height: 5' (152.4 cm) Weight: 138 lb 0.1 oz (62.6 kg) IBW/kg (Calculated) : 45.5 Usual Weight: 65 kg  Vital Signs: Temp: 97.6 F (36.4 C) (03/11 0517) Temp src: Oral (03/11 0517) BP: 105/64 mmHg (03/11 0517) Pulse Rate: 88 (03/11 0517) Intake/Output from previous day: 03/10 0701 - 03/11 0700 In: 1570 [I.V.:450; IV Piggyback:400; TPN:720] Out: 2210 [Urine:2210] Intake/Output from this shift:    Labs:  Recent Labs  03/10/12 0505 03/11/12 0700 03/12/12 0347  WBC 14.5* 11.1* 13.5*  HGB 8.8* 8.2* 8.2*  HCT 27.7* 26.3* 25.9*  PLT 530* 528* 561*     Recent Labs  03/10/12 0505 03/11/12 0700 03/12/12 0347  NA 132* 136 134*  K 4.5 4.8 4.8  CL 97 99 99  CO2 29 30 30   GLUCOSE 138* 113* 129*  BUN 23 26* 31*  CREATININE 0.78 0.90 1.04  CALCIUM 9.0 8.9 8.8  MG 2.2 2.3  --   PHOS 3.2 3.6  --   PROT 5.9* 5.8* 5.9*  ALBUMIN 2.2* 2.1* 2.2*  AST 22 15 31   ALT 14 13 23   ALKPHOS 73 70 84  BILITOT 0.4 0.4 0.3  TRIG  --  79  --   CHOL  --  101  --    Estimated Creatinine Clearance: 34.4 ml/min (by C-G formula based on Cr of 1.04).    Recent Labs  03/12/12 0015 03/12/12 0420 03/12/12 0747  GLUCAP 133* 131* 115*    Nutritional Goals:  - RD recs per day: 1420-1660 Kcal, 80-94 g protein, 2L fluid.  -Clinimix E5/20 at 9ml/hr + IVFE 20% at 69ml/hr on MWF to provide: 84g/day protein, 1684Kcal/day (Avg 1478Kcal/day STTHS, 1958Kcal/day MWF).  Current nutrition:  - Diet: NPO since 3/3 - TNA: Clinimix E 5/20 @ goal rate of 70 ml/hr - mIVF: NS at 20 ml/hr  CBGs & Insulin requirements past 24 hours:  - CBGs in goal range of <150 - 5 units SSI  Labs: Electrolytes: stable Renal Function: Scr wnl, although slowly  trending up Hepatic Function: wnl  Pre-Albumin: 7.9 (3/5)  Triglycerides: 80 (3/5), 79 (3/10)   Assessment:  79 yo M with h/o diveriticulitis presented 2/27 with abd pain, N/V and fatigue. CT abd/pelvis in ED with worsened chronic inflammatory process involving the sigmoid colon. Pt admitted for acute on chronic sigmoiditis, diverticulitis, and UTI. GI on board, pt did not improve with Cipro/Flagyl. Repeat CT 3/3 with chronic sigmoid colon thickening, small area of air either contained within diverticulum or escaped from bowel. Surgery, cardiology and pulmonary consulted and investigating perioperative risk.  TNA ordered to start 3/4 given intolerance to enteral feeding.   - 3/11: Today is TNA Day#8, currently at goal rate of 32ml/hr. Cardiology and pulmonary cleared for surgery if needed and Dr. Donell Beers has advised patient that surgery is needed but it is not emergent so patient to think about it further per notes.  Plan continue bowel rest, TNA, abx, repeat imaging in a few days hoping for improvement. 3/9 abd CT: "Chronic bowel wall thickening and inflammation through the proximal sigmoid colon which may be slightly improved compared to most recent exam. Differential includes chronic diverticulitis, inflammatory bowel disease and cannot exclude neoplasm."  Surgery is still a possibility, although looks like patient is refusing. Patient appears to be  tolerating TNA at goal rate. Waiting for decision on long-term plans vs TNA vs surgery.   Plan:    Continue Clinimix E 5/20 at goal rate of 70 ml/hr.   TNA to contain IV fat emulsion 20% at 10 ml/hr on MWF only due to ongoing shortage  Standard multivitamins and trace elements on MWF only due to ongoing shortage.  Patient not taking anything by mouth so unable to convert MVI to PO at this time.  TNA labs Monday/Thursdays  Awaiting decision on long-term nutrition plan vs surgery.   Darrol Angel, PharmD Pager: [phone] 03/12/2012 8:31  AM

## 2012-03-12 NOTE — Progress Notes (Signed)
PT Cancellation Note  Patient Details Name: Katie Galvan MRN: [id number] DOB: 02/05/1927   Cancelled Treatment:    Reason Eval/Treat Not Completed: Fatigue/lethargy limiting ability to participate;Other (comment) (pt reports not feeling well today)   LEMYRE,[name] 03/12/2012, 2:50 PM Pager: [phone]

## 2012-03-12 NOTE — Progress Notes (Signed)
Patient ID: Katie Galvan, female   DOB: [birth year], 79 y.o.   MRN: [id number] P   Subjective: Not really any different today although abd seems more diffusely tender, denies n/v, fevers, chills.  +bm this am  Objective: Vital signs in last 24 hours: Temp:  [97.4 F (36.3 C)-98.7 F (37.1 C)] 97.6 F (36.4 C) (03/11 0517) Pulse Rate:  [87-92] 88 (03/11 0517) Resp:  [16-18] 16 (03/11 0517) BP: (100-121)/(43-71) 105/64 mmHg (03/11 0517) SpO2:  [98 %-99 %] 98 % (03/11 0748) Weight:  [138 lb 0.1 oz (62.6 kg)] 138 lb 0.1 oz (62.6 kg) (03/11 0517) Last BM Date: 03/11/12  Intake/Output from previous day: 03/10 0701 - 03/11 0700 In: 1570 [I.V.:450; IV Piggyback:400; TPN:720] Out: 2210 [Urine:2210] Intake/Output this shift:    PE: Abd: soft, more diffusely tender then yesterday General: awake, alert, On O2, appears more ill today.  Lab Results:   Recent Labs  03/11/12 0700 03/12/12 0347  WBC 11.1* 13.5*  HGB 8.2* 8.2*  HCT 26.3* 25.9*  PLT 528* 561*   BMET  Recent Labs  03/11/12 0700 03/12/12 0347  NA 136 134*  K 4.8 4.8  CL 99 99  CO2 30 30  GLUCOSE 113* 129*  BUN 26* 31*  CREATININE 0.90 1.04  CALCIUM 8.9 8.8   PT/INR No results found for this basename: LABPROT, INR,  in the last 72 hours CMP     Component Value Date/Time   NA 134* 03/12/2012 0347   NA 139 11/27/2011 1533   K 4.8 03/12/2012 0347   K 4.1 11/27/2011 1533   CL 99 03/12/2012 0347   CL 100 11/27/2011 1533   CO2 30 03/12/2012 0347   CO2 31* 11/27/2011 1533   GLUCOSE 129* 03/12/2012 0347   GLUCOSE 97 11/27/2011 1533   BUN 31* 03/12/2012 0347   BUN 22.0 11/27/2011 1533   CREATININE 1.04 03/12/2012 0347   CREATININE 0.9 11/27/2011 1533   CALCIUM 8.8 03/12/2012 0347   CALCIUM 10.0 11/27/2011 1533   PROT 5.9* 03/12/2012 0347   PROT 7.1 11/27/2011 1533   ALBUMIN 2.2* 03/12/2012 0347   ALBUMIN 3.4* 11/27/2011 1533   AST 31 03/12/2012 0347   AST 16 11/27/2011 1533   ALT 23 03/12/2012 0347   ALT 11  11/27/2011 1533   ALKPHOS 84 03/12/2012 0347   ALKPHOS 73 11/27/2011 1533   BILITOT 0.3 03/12/2012 0347   BILITOT 0.34 11/27/2011 1533   GFRNONAA 49* 03/12/2012 0347   GFRAA 56* 03/12/2012 0347   Lipase     Component Value Date/Time   LIPASE 8* 11/08/2011 1539       Studies/Results: Ct Abdomen Pelvis W Contrast  03/11/2012  *RADIOLOGY REPORT*  Clinical Data: Persistent diverticulitis  CT ABDOMEN AND PELVIS WITH CONTRAST  Technique:  Multidetector CT imaging of the abdomen and pelvis was performed following the standard protocol during bolus administration of intravenous contrast.  Contrast: 50mL OMNIPAQUE IOHEXOL 300 MG/ML  SOLN, OMNIPAQUE IOHEXOL 300 MG/ML  SOLN  Comparison: CT 03/04/2012, 02/27 1014, 10/30/2028  Findings: Seen there is small right effusion with basilar atelectasis which is slightly improved compared to prior.  No focal hepatic lesion entire liver is not imaged.  Gallbladder, pancreas, spleen, adrenal glands, and kidneys are unchanged.  There is a nonobstructing calculus in the left kidney.  Low density cysts within the left and right kidney.  The stomach and small bowel are normal.  No evidence of bowel obstruction.  There is persistent bowel wall thickening and inflammation  in the proximal sigmoid colon over approximately 10 cm segment. There may be slight improvement in the inflammation in the fat surrounding the sigmoid colon.  There is still persistent significant inflammation and bowel wall thickening.  There is small amount fat within the wall which suggest a chronic component.  A small gas collection to the superior the colon immediately adjacent to the serosal surface either represents  a diverticulum or contained perforation, favor diverticulum.  This is seen on image 56 and is not changed.  There is a small amount fluid surrounding the rectum which is slightly increased compared to prior.  Abdominal aorta normal caliber.  No retroperitoneal periportal lymphadenopathy.   There is small amount free fluid in the pelvis slightly increased. Post hysterectomy anatomy.  The bladder is normal.  No pelvic lymphadenopathy.  IMPRESSION:  1. Chronic bowel wall thickening and inflammation through the proximal sigmoid colon  which may be slightly improved compared to most recent exam.  Differential includes chronic diverticulitis, inflammatory bowel disease and cannot exclude neoplasm.  Recommend follow up imaging or colonoscopy. 2.  Small amount free fluid the pelvis is slightly increased. 3.  Slight improvement in right pleural effusion.   Original Report Authenticated By: Genevive Bi, M.D.     Anti-infectives: Anti-infectives   Start     Dose/Rate Route Frequency Ordered Stop   03/10/12 1000  piperacillin-tazobactam (ZOSYN) IVPB 3.375 g     3.375 g 12.5 mL/hr over 240 Minutes Intravenous Every 8 hours 03/10/12 0851     03/10/12 0815  piperacillin-tazobactam (ZOSYN) IVPB 3.375 g  Status:  Discontinued     3.375 g 12.5 mL/hr over 240 Minutes Intravenous Every 8 hours 03/10/12 0807 03/10/12 0851   03/10/12 0815  fluconazole (DIFLUCAN) IVPB 200 mg     200 mg 100 mL/hr over 60 Minutes Intravenous Every 24 hours 03/10/12 0807     03/06/12 1200  ertapenem (INVANZ) 1 g in sodium chloride 0.9 % 50 mL IVPB  Status:  Discontinued     1 g 100 mL/hr over 30 Minutes Intravenous Every 24 hours 03/06/12 1059 03/10/12 0807   03/03/12 1200  cefTRIAXone (ROCEPHIN) 1 g in dextrose 5 % 50 mL IVPB  Status:  Discontinued     1 g 100 mL/hr over 30 Minutes Intravenous Every 24 hours 03/03/12 0958 03/06/12 1059   02/29/12 2000  metroNIDAZOLE (FLAGYL) IVPB 500 mg  Status:  Discontinued     500 mg 100 mL/hr over 60 Minutes Intravenous Every 8 hours 02/29/12 1919 03/06/12 1059   02/29/12 2000  ciprofloxacin (CIPRO) IVPB 400 mg  Status:  Discontinued     400 mg 200 mL/hr over 60 Minutes Intravenous Every 12 hours 02/29/12 1925 03/03/12 0958   02/29/12 1745  cefTRIAXone (ROCEPHIN) 1 g in  dextrose 5 % 50 mL IVPB     1 g 100 mL/hr over 30 Minutes Intravenous  Once 02/29/12 1744 02/29/12 1859   02/29/12 1745  metroNIDAZOLE (FLAGYL) IVPB 500 mg  Status:  Discontinued     500 mg 100 mL/hr over 60 Minutes Intravenous  Once 02/29/12 1744 02/29/12 1929   02/29/12 1745  ciprofloxacin (CIPRO) IVPB 400 mg  Status:  Discontinued     400 mg 200 mL/hr over 60 Minutes Intravenous  Once 02/29/12 1744 02/29/12 1928       Assessment/Plan 1. Diverticulitis vs colitis of sigmoid colon: CT scan - 03/04/2012 - chronic sigmoid colon thickening over about 10 cm (similar to CT scan 03/01/2011 and may be  a little worse than 10/31/2011),  WBC a little better today, continue TNA, cont NPO.  WBC trending back up and the patient's abd is more diffusely tender and she appears much more ill.  She likely needs to go ahead with surgery and she is now ready to pursue that, Dr. Carolynne Edouard will be by to see the patient shortly.      LOS: 12 days    WHITE, ELIZABETH 03/12/2012

## 2012-03-12 NOTE — Progress Notes (Signed)
CRITICAL VALUE ALERT  Critical value received:  (+) blood culture - anaerobic model showing gram (+) cocci in pairs  Date of notification:  03/12/12  Time of notification:  21:25  Critical value read back:yes  Nurse who received alert:  Christell Faith, RN  MD notified (1st page):  Kirtland Bouchard. Schorr  Time of first page:  21:34  MD notified (2nd page):  Time of second page:  Responding MD:  Merdis Delay  Time MD responded:  21:37   No new orders from MD.

## 2012-03-12 NOTE — Progress Notes (Signed)
NUTRITION FOLLOW UP  Intervention:   TPN per PharmD: Clinimix E5/20 at 52ml/hr + IVFE 20% at 12ml/hr on MWF to provide: 84g/day protein, 1684Kcal/day (Avg 1478Kcal/day STTHS, 1958Kcal/day MWF). TPN meets 105% of estimated calorie needs and 100% of estimated protein needs.  Nutrition Dx:   Inadequate oral intake related to inability to eat as evidenced by NPO status.   Goal:   Pt to meet >/= 90% of their estimated nutrition needs; being met  Monitor:   TPN; at goal Wt; 9 lb wt loss since 3/4 (possibly related to fluid loss)   Assessment:   79 y.o. year-old female with history of likely ischemic LVEF 30%, restrictive lung disease due to elevated diaphragm, COPD, GERD, endocarditis, recurrent falls, recurrent UTIs, and hx of diverticulitis who presents with abdominal pain, N/V and fatigue.  Per nursing rounds this morning pt has chosen to have surgery if possible. Pt states that she doesn't feel well but, she still feels hungry. TPN is at goal rate. Will follow-up with pt after potential surgery.   Height: Ht Readings from Last 1 Encounters:  02/29/12 5' (1.524 m)    Weight Status:   Wt Readings from Last 1 Encounters:  03/12/12 138 lb 0.1 oz (62.6 kg)    Re-estimated needs:  Kcal: 2956-2130 Protein: 75-88 grams  Fluid: 2 L  Skin: Generalized edema; ecchymosis on arm, hand, leg, hip  Diet Order: NPO   Intake/Output Summary (Last 24 hours) at 03/12/12 1240 Last data filed at 03/12/12 1200  Gross per 24 hour  Intake   1370 ml  Output   2210 ml  Net   -840 ml    Last BM: 3/10   Labs:   Recent Labs Lab 03/07/12 0354  03/10/12 0505 03/11/12 0700 03/12/12 0347  NA 136  < > 132* 136 134*  K 3.8  < > 4.5 4.8 4.8  CL 99  < > 97 99 99  CO2 32  < > 29 30 30   BUN 11  < > 23 26* 31*  CREATININE 0.78  < > 0.78 0.90 1.04  CALCIUM 8.6  < > 9.0 8.9 8.8  MG 1.9  --  2.2 2.3  --   PHOS 2.7  --  3.2 3.6  --   GLUCOSE 116*  < > 138* 113* 129*  < > = values in this  interval not displayed.  CBG (last 3)   Recent Labs  03/12/12 0420 03/12/12 0747 03/12/12 1218  GLUCAP 131* 115* 130*    Scheduled Meds: . antiseptic oral rinse  15 mL Mouth Rinse q12n4p  . budesonide-formoterol  1 puff Inhalation BID  . chlorhexidine  15 mL Mouth Rinse BID  . fluconazole (DIFLUCAN) IV  200 mg Intravenous Q24H  . heparin  5,000 Units Subcutaneous Q8H  . insulin aspart  0-9 Units Subcutaneous Q4H  . metronidazole  500 mg Intravenous Q8H  . ondansetron (ZOFRAN) IV  8 mg Intravenous Q6H  . piperacillin-tazobactam (ZOSYN)  IV  3.375 g Intravenous Q8H  . sodium chloride  3 mL Intravenous Q12H  . tiotropium  18 mcg Inhalation Daily    Continuous Infusions: . sodium chloride 20 mL/hr at 03/12/12 0248  . sodium chloride 20 mL/hr at 03/10/12 2142  . fat emulsion 240 mL (03/11/12 1717)  . TPN (CLINIMIX) +/- additives 70 mL/hr at 03/11/12 1717  . TPN [facility]) +/- additives      Ian Malkin RD, LDN Inpatient Clinical Dietitian Pager: [phone] After Hours Pager: [phone]

## 2012-03-12 NOTE — Progress Notes (Signed)
[facility] PROGRESS NOTE  Katie Galvan NWG:[id number] DOB: 1927/11/18 DOA: 02/29/2012 PCP: Michele Mcalpine, MD  Brief narrative: 79 year old female with past medical history significant for diverticulitis, ischemic cardiomyopathy (EF 30%), perimyocarditis, COPD, endocarditis, recurrent falls who presented to [facility] ED 02/29/2012 due to abdominal pain, nausea and vomiting. In ED, evaluation included CT abd/pelvis with findings significant for sigmoid diverticulitis worse compared to previous imaging studies. Surgery and GI are assisting management. Per GI and surgery recommendations, patient has had slow improvement with conservative management involving NPO and antibiotics so even with high level of risk patient may benefit from surgery. CT abdomen repeated 03/11/2012 with findings of chronic bowel wall thickening and inflammation through the proximal sigmoid colon  which may be slightly improved compared to most recent exam. Per surgery, flagyl added to current antibiotic regimen and if no significant improvement in next 24 hours plan is for colectomy and colostomy.  Assessment and Plan:   Principal Problem:  *Acute Sigmoid Diverticulitis  Abdominal pain slowly improving.CT abdomen repeated 03/11/2012 with findings of chronic bowel wall thickening and inflammation through the proximal sigmoid colon  which may be slightly improved compared to most recent exam Continue TNA and antibiotics, Fluconazole and Zosyn. Added flagyl today. Appreciate surgery following  Analgesia with morphine 4 mg Q 3 hours IV PRN severe pain  Zofran 8 mg Q 6 hours IV scheduled and Phenergan 12.5 mg Q 6 hours IV PRN nausea or vomiting  Active Problems:  UTI (lower urinary tract infection)  Secondary to E.Coli  Patient has already completed 4 days of ceftriaxone. Chronic respiratory failure with hypoxia  Secondary to COPD  Respiratory status stable  Continue symbicort  Albuterol Q 6 hours PRN Chronic Systolic and  Diastolic CHF  2 D ECHO on this admission with EF 55%  BNP on this admission - slightly elevated at 278  Apprecaite cardiology following Pancreatic mass  Stable and being followed by GI.  Bilateral lower extremity ulcers  Stable, daily dressing changes Leukocytosis  Related to sigmoid diverticulitis.  Management as above Iron deficiency anemia  Hemoglobin stable at 8.2  GI following Moderate protein calorie malnutrition  On TNA   Code Status: DNR/DNI  Family Communication: no family at bedside  Disposition Plan: home when stable    Consultants:  Dr. Arlyce Dice ( GI)  Dr. Ezzard Standing (Surgery)  Dr. Molli Knock (Pulm)  Dr. Excell Seltzer (Cardiology) Procedures:  none Antibiotics:  Fluconazole 03/10/2012 --> Zosyn 03/10/2012 --> Flagyl 03/12/2012 -->   Manson Passey, MD  [facility]  Pager [phone]   If 7PM-7AM, please contact night-coverage [email or web address] Password [email or web address] 03/12/2012, 7:23 AM   LOS: 12 days    HPI/Subjective: No acute overnight events.  Objective: Filed Vitals:   03/11/12 1315 03/11/12 2107 03/11/12 2111 03/12/12 0517  BP: 121/71 100/43  105/64  Pulse: 87 92  88  Temp: 98.7 F (37.1 C) 97.4 F (36.3 C)  97.6 F (36.4 C)  TempSrc: Oral Oral  Oral  Resp: 17 18 18 16   Height:      Weight:    62.6 kg (138 lb 0.1 oz)  SpO2: 98% 99%  99%    Intake/Output Summary (Last 24 hours) at 03/12/12 0723 Last data filed at 03/12/12 0400  Gross per 24 hour  Intake   1570 ml  Output   2210 ml  Net   -640 ml    Exam:   General:  Pt is alert, follows commands appropriately, not in acute distress  Cardiovascular: Regular rate and rhythm, S1/S2,  no murmurs, no rubs, no gallops  Respiratory: Clear to auscultation bilaterally, no wheezing, no crackles, no rhonchi  Abdomen: Soft, tender in lower abdomen, non distended, bowel sounds present, no guarding  Extremities: No edema, pulses DP and PT palpable bilaterally  Neuro: Grossly nonfocal  Data Reviewed: Basic Metabolic  Panel:  Recent Labs Lab 03/06/12 0451 03/07/12 0354 03/08/12 0535 03/09/12 0445 03/10/12 0505 03/11/12 0700 03/12/12 0347  NA 138 136 134* 135 132* 136 134*  K 3.8 3.8 4.5 4.6 4.5 4.8 4.8  CL 101 99 98 100 97 99 99  CO2 30 32 33* 32 29 30 30   GLUCOSE 152* 116* 129* 149* 138* 113* 129*  BUN 11 11 16 20 23  26* 31*  CREATININE 0.84 0.78 0.75 0.79 0.78 0.90 1.04  CALCIUM 8.3* 8.6 8.6 8.7 9.0 8.9 8.8  MG 1.7 1.9  --   --  2.2 2.3  --   PHOS 2.1* 2.7  --   --  3.2 3.6  --    Liver Function Tests:  Recent Labs Lab 03/08/12 0535 03/09/12 0445 03/10/12 0505 03/11/12 0700 03/12/12 0347  AST 11 14 22 15 31   ALT 8 7 14 13 23   ALKPHOS 53 62 73 70 84  BILITOT 0.3 0.2* 0.4 0.4 0.3  PROT 5.0* 5.6* 5.9* 5.8* 5.9*  ALBUMIN 2.0* 2.1* 2.2* 2.1* 2.2*   No results found for this basename: LIPASE, AMYLASE,  in the last 168 hours No results found for this basename: AMMONIA,  in the last 168 hours CBC:  Recent Labs Lab 03/06/12 0451  03/08/12 0535 03/09/12 0445 03/10/12 0505 03/11/12 0700 03/12/12 0347  WBC 15.5*  < > 13.9* 14.6* 14.5* 11.1* 13.5*  NEUTROABS 12.4*  --   --   --   --  7.8*  --   HGB 8.7*  < > 8.2* 8.4* 8.8* 8.2* 8.2*  HCT 28.0*  < > 25.7* 26.8* 27.7* 26.3* 25.9*  MCV 85.6  < > 85.7 85.9 86.0 87.4 87.2  PLT 318  < > 348 432* 530* 528* 561*  < > = values in this interval not displayed. Cardiac Enzymes: No results found for this basename: CKTOTAL, CKMB, CKMBINDEX, TROPONINI,  in the last 168 hours BNP: No components found with this basename: POCBNP,  CBG:  Recent Labs Lab 03/11/12 1204 03/11/12 1644 03/11/12 1943 03/12/12 0015 03/12/12 0420  GLUCAP 130* 125* 114* 133* 131*    Recent Results (from the past 240 hour(s))  CLOSTRIDIUM DIFFICILE BY PCR     Status: None   Collection Time    03/03/12  1:27 AM      Result Value Range Status   C difficile by pcr NEGATIVE  NEGATIVE Final  STOOL CULTURE     Status: None   Collection Time    03/08/12  6:46 AM       Result Value Range Status   Specimen Description STOOL   Final   Special Requests Normal   Final   Culture     Final   Value: NO SUSPICIOUS COLONIES, CONTINUING TO HOLD     Note: REDUCED NORMAL FLORA PRESENT   Report Status PENDING   Incomplete     Studies: Ct Abdomen Pelvis W Contrast 03/11/2012  * IMPRESSION:  1. Chronic bowel wall thickening and inflammation through the proximal sigmoid colon  which may be slightly improved compared to most recent exam.  Differential includes chronic diverticulitis, inflammatory bowel disease and cannot exclude neoplasm.  Recommend follow up imaging  or colonoscopy. 2.  Small amount free fluid the pelvis is slightly increased. 3.  Slight improvement in right pleural effusion.      Scheduled Meds: . antiseptic oral rinse  15 mL Mouth Rinse q12n4p  . budesonide-formoterol  1 puff Inhalation BID  . chlorhexidine  15 mL Mouth Rinse BID  . fluconazole (DIFLUCAN) IV  200 mg Intravenous Q24H  . heparin  5,000 Units Subcutaneous Q8H  . insulin aspart  0-9 Units Subcutaneous Q4H  . ondansetron (ZOFRAN) IV  8 mg Intravenous Q6H  . piperacillin-tazobactam (ZOSYN)  IV  3.375 g Intravenous Q8H  . sodium chloride  3 mL Intravenous Q12H  . tiotropium  18 mcg Inhalation Daily   Continuous Infusions: . sodium chloride 20 mL/hr at 03/12/12 0248  . sodium chloride 20 mL/hr at 03/10/12 2142  . fat emulsion 240 mL (03/11/12 1717)  . TPN (CLINIMIX) +/- additives 70 mL/hr at 03/11/12 1717

## 2012-03-12 NOTE — Progress Notes (Signed)
Subjective: She says she feels about the same today. No fevers or nausea. Still tender in LLQ. She is starting to think that she would accept surgery if needed  Objective: Vital signs in last 24 hours: Temp:  [97.4 F (36.3 C)-98.7 F (37.1 C)] 97.6 F (36.4 C) (03/11 0517) Pulse Rate:  [87-92] 88 (03/11 0517) Resp:  [16-18] 16 (03/11 0517) BP: (100-121)/(43-71) 105/64 mmHg (03/11 0517) SpO2:  [98 %-99 %] 98 % (03/11 0748) Weight:  [138 lb 0.1 oz (62.6 kg)] 138 lb 0.1 oz (62.6 kg) (03/11 0517) Last BM Date: 03/11/12  Intake/Output from previous day: 03/10 0701 - 03/11 0700 In: 1570 [I.V.:450; IV Piggyback:400; TPN:720] Out: 2210 [Urine:2210] Intake/Output this shift:    GI: soft on right but still tender on left  Lab Results:   Recent Labs  03/11/12 0700 03/12/12 0347  WBC 11.1* 13.5*  HGB 8.2* 8.2*  HCT 26.3* 25.9*  PLT 528* 561*   BMET  Recent Labs  03/11/12 0700 03/12/12 0347  NA 136 134*  K 4.8 4.8  CL 99 99  CO2 30 30  GLUCOSE 113* 129*  BUN 26* 31*  CREATININE 0.90 1.04  CALCIUM 8.9 8.8   PT/INR No results found for this basename: LABPROT, INR,  in the last 72 hours ABG No results found for this basename: PHART, PCO2, PO2, HCO3,  in the last 72 hours  Studies/Results: Ct Abdomen Pelvis W Contrast  03/11/2012  *RADIOLOGY REPORT*  Clinical Data: Persistent diverticulitis  CT ABDOMEN AND PELVIS WITH CONTRAST  Technique:  Multidetector CT imaging of the abdomen and pelvis was performed following the standard protocol during bolus administration of intravenous contrast.  Contrast: 50mL OMNIPAQUE IOHEXOL 300 MG/ML  SOLN, OMNIPAQUE IOHEXOL 300 MG/ML  SOLN  Comparison: CT 03/04/2012, 02/27 1014, 10/30/2028  Findings: Seen there is small right effusion with basilar atelectasis which is slightly improved compared to prior.  No focal hepatic lesion entire liver is not imaged.  Gallbladder, pancreas, spleen, adrenal glands, and kidneys are unchanged.   There is a nonobstructing calculus in the left kidney.  Low density cysts within the left and right kidney.  The stomach and small bowel are normal.  No evidence of bowel obstruction.  There is persistent bowel wall thickening and inflammation in the proximal sigmoid colon over approximately 10 cm segment. There may be slight improvement in the inflammation in the fat surrounding the sigmoid colon.  There is still persistent significant inflammation and bowel wall thickening.  There is small amount fat within the wall which suggest a chronic component.  A small gas collection to the superior the colon immediately adjacent to the serosal surface either represents  a diverticulum or contained perforation, favor diverticulum.  This is seen on image 56 and is not changed.  There is a small amount fluid surrounding the rectum which is slightly increased compared to prior.  Abdominal aorta normal caliber.  No retroperitoneal periportal lymphadenopathy.  There is small amount free fluid in the pelvis slightly increased. Post hysterectomy anatomy.  The bladder is normal.  No pelvic lymphadenopathy.  IMPRESSION:  1. Chronic bowel wall thickening and inflammation through the proximal sigmoid colon  which may be slightly improved compared to most recent exam.  Differential includes chronic diverticulitis, inflammatory bowel disease and cannot exclude neoplasm.  Recommend follow up imaging or colonoscopy. 2.  Small amount free fluid the pelvis is slightly increased. 3.  Slight improvement in right pleural effusion.   Original Report Authenticated By: Roseanne Reno  Amil Amen, M.D.     Anti-infectives: Anti-infectives   Start     Dose/Rate Route Frequency Ordered Stop   03/12/12 0915  metroNIDAZOLE (FLAGYL) IVPB 500 mg     500 mg 100 mL/hr over 60 Minutes Intravenous Every 8 hours 03/12/12 0908     03/10/12 1000  piperacillin-tazobactam (ZOSYN) IVPB 3.375 g     3.375 g 12.5 mL/hr over 240 Minutes Intravenous Every 8 hours  03/10/12 0851     03/10/12 0815  piperacillin-tazobactam (ZOSYN) IVPB 3.375 g  Status:  Discontinued     3.375 g 12.5 mL/hr over 240 Minutes Intravenous Every 8 hours 03/10/12 0807 03/10/12 0851   03/10/12 0815  fluconazole (DIFLUCAN) IVPB 200 mg     200 mg 100 mL/hr over 60 Minutes Intravenous Every 24 hours 03/10/12 0807     03/06/12 1200  ertapenem (INVANZ) 1 g in sodium chloride 0.9 % 50 mL IVPB  Status:  Discontinued     1 g 100 mL/hr over 30 Minutes Intravenous Every 24 hours 03/06/12 1059 03/10/12 0807   03/03/12 1200  cefTRIAXone (ROCEPHIN) 1 g in dextrose 5 % 50 mL IVPB  Status:  Discontinued     1 g 100 mL/hr over 30 Minutes Intravenous Every 24 hours 03/03/12 0958 03/06/12 1059   02/29/12 2000  metroNIDAZOLE (FLAGYL) IVPB 500 mg  Status:  Discontinued     500 mg 100 mL/hr over 60 Minutes Intravenous Every 8 hours 02/29/12 1919 03/06/12 1059   02/29/12 2000  ciprofloxacin (CIPRO) IVPB 400 mg  Status:  Discontinued     400 mg 200 mL/hr over 60 Minutes Intravenous Every 12 hours 02/29/12 1925 03/03/12 0958   02/29/12 1745  cefTRIAXone (ROCEPHIN) 1 g in dextrose 5 % 50 mL IVPB     1 g 100 mL/hr over 30 Minutes Intravenous  Once 02/29/12 1744 02/29/12 1859   02/29/12 1745  metroNIDAZOLE (FLAGYL) IVPB 500 mg  Status:  Discontinued     500 mg 100 mL/hr over 60 Minutes Intravenous  Once 02/29/12 1744 02/29/12 1929   02/29/12 1745  ciprofloxacin (CIPRO) IVPB 400 mg  Status:  Discontinued     400 mg 200 mL/hr over 60 Minutes Intravenous  Once 02/29/12 1744 02/29/12 1928      Assessment/Plan: s/p * No surgery found * At this point i do think she is at high risk for surgery but she is not significantly worse than she has been. I would add Flagyl back to her abx regimen. If she does not show any improvement over the next 24 hrs then we will talk about colectomy and colostomy for tomorrow  LOS: 12 days    TOTH III,PAUL S 03/12/2012

## 2012-03-13 LAB — CBC
Hemoglobin: 7.9 g/dL — ABNORMAL LOW (ref 12.0–15.0)
RBC: 2.9 MIL/uL — ABNORMAL LOW (ref 3.87–5.11)

## 2012-03-13 LAB — COMPREHENSIVE METABOLIC PANEL
AST: 19 U/L (ref 0–37)
CO2: 29 mEq/L (ref 19–32)
Calcium: 9 mg/dL (ref 8.4–10.5)
Creatinine, Ser: 1.02 mg/dL (ref 0.50–1.10)
GFR calc Af Amer: 58 mL/min — ABNORMAL LOW (ref >90)
GFR calc non Af Amer: 50 mL/min — ABNORMAL LOW (ref >90)
Glucose, Bld: 118 mg/dL — ABNORMAL HIGH (ref 70–99)

## 2012-03-13 LAB — GLUCOSE, CAPILLARY
Glucose-Capillary: 123 mg/dL — ABNORMAL HIGH (ref 70–99)
Glucose-Capillary: 145 mg/dL — ABNORMAL HIGH (ref 70–99)

## 2012-03-13 MED ORDER — ZOLPIDEM TARTRATE 5 MG PO TABS
5.0000 mg | ORAL_TABLET | Freq: Once | ORAL | Status: AC
  Administered 2012-03-13: 5 mg via ORAL
  Filled 2012-03-13: qty 1

## 2012-03-13 MED ORDER — TRACE MINERALS CR-CU-F-FE-I-MN-MO-SE-ZN IV SOLN
INTRAVENOUS | Status: AC
  Administered 2012-03-13: 18:00:00 via INTRAVENOUS
  Filled 2012-03-13: qty 2000

## 2012-03-13 MED ORDER — FAT EMULSION 20 % IV EMUL
240.0000 mL | INTRAVENOUS | Status: AC
  Administered 2012-03-13: 240 mL via INTRAVENOUS
  Administered 2012-03-14 (×2): 10 mL via INTRAVENOUS
  Filled 2012-03-13: qty 250

## 2012-03-13 NOTE — Progress Notes (Signed)
Note entered by night shift RN regarding blood cultures was entered in error. +blood cultures were on a different patient. Julio Sicks RN

## 2012-03-13 NOTE — Progress Notes (Signed)
ANTIBIOTIC CONSULT NOTE - Follow Up  Pharmacy Consult for Zosyn Indication: diverticulitis  Allergies  Allergen Reactions  . Sulfonamide Derivatives Swelling    Patient Measurements: Height: 5' (152.4 cm) Weight: 138 lb 9.6 oz (62.869 kg) IBW/kg (Calculated) : 45.5  Vital Signs: Temp: 98.7 F (37.1 C) (03/12 0516) Temp src: Oral (03/12 0516) BP: 110/51 mmHg (03/12 0516) Pulse Rate: 84 (03/12 0516)  Labs:  Recent Labs  03/11/12 0700 03/12/12 0347 03/13/12 0430  WBC 11.1* 13.5* 11.2*  HGB 8.2* 8.2* 7.9*  PLT 528* 561* 596*  CREATININE 0.90 1.04 1.02   Estimated Creatinine Clearance: 35.2 ml/min (by C-G formula based on Cr of 1.02).   Microbiology: Recent Results (from the past 720 hour(s))  URINE CULTURE     Status: None   Collection Time    02/29/12  3:51 PM      Result Value Range Status   Specimen Description URINE, CLEAN CATCH   Final   Special Requests NONE   Final   Culture  Setup Time 03/01/2012 01:39   Final   Colony Count >=100,000 COLONIES/ML   Final   Culture ESCHERICHIA COLI   Final   Report Status 03/02/2012 FINAL   Final   Organism ID, Bacteria ESCHERICHIA COLI   Final  CLOSTRIDIUM DIFFICILE BY PCR     Status: None   Collection Time    03/03/12  1:27 AM      Result Value Range Status   C difficile by pcr NEGATIVE  NEGATIVE Final  STOOL CULTURE     Status: None   Collection Time    03/08/12  6:46 AM      Result Value Range Status   Specimen Description STOOL   Final   Special Requests Normal   Final   Culture     Final   Value: NO SALMONELLA, SHIGELLA, CAMPYLOBACTER, YERSINIA, OR E.COLI 0157:H7 ISOLATED     Note: REDUCED NORMAL FLORA PRESENT   Report Status 03/12/2012 FINAL   Final    Assessment:  79 yo female on Day #4 Zosyn 3.375g IV q8h (4-hr infusion). Dose remains appropriate for CrCl ~ 46ml/min.  Today is also Day #4 Fluconazole 200mg  IV q24h (started by MD for possible perforation?)   Flagyl 500mg  IV q8h started by MD also.  Given negative C.diff test on 3/2, no need for both Zosyn and Flagyl (similar anaerobic coverage)  Plan:  1) No changes to Zosyn at this time 2) No need for both Zosyn and Flagyl (similar anaerobic coverage) - suggest d/c'ing one of these.  Darrol Angel, PharmD Pager: [phone] 03/13/2012 8:27 AM

## 2012-03-13 NOTE — Progress Notes (Signed)
PARENTERAL NUTRITION CONSULT NOTE - FOLLOW UP  Pharmacy Consult for TNA Indication: Intolerance to enteral feeding (Diverticulitis vs colitis of sigmoid colon)  Allergies  Allergen Reactions  . Sulfonamide Derivatives Swelling    Patient Measurements: Height: 5' (152.4 cm) Weight: 138 lb 9.6 oz (62.869 kg) IBW/kg (Calculated) : 45.5 Usual Weight: 65 kg  Vital Signs: Temp: 98.7 F (37.1 C) (03/12 0516) Temp src: Oral (03/12 0516) BP: 110/51 mmHg (03/12 0516) Pulse Rate: 84 (03/12 0516) Intake/Output from previous day: 03/11 0701 - 03/12 0700 In: 1766.3 [I.V.:593.3; TPN:1173] Out: 2311 [Urine:2310; Stool:1] Intake/Output from this shift: Total I/O In: -  Out: 100 [Urine:100]  Labs:  Recent Labs  03/11/12 0700 03/12/12 0347 03/13/12 0430  WBC 11.1* 13.5* 11.2*  HGB 8.2* 8.2* 7.9*  HCT 26.3* 25.9* 25.5*  PLT 528* 561* 596*     Recent Labs  03/11/12 0700 03/12/12 0347 03/13/12 0430  NA 136 134* 133*  K 4.8 4.8 4.8  CL 99 99 98  CO2 30 30 29   GLUCOSE 113* 129* 118*  BUN 26* 31* 32*  CREATININE 0.90 1.04 1.02  CALCIUM 8.9 8.8 9.0  MG 2.3  --   --   PHOS 3.6  --   --   PROT 5.8* 5.9* 6.0  ALBUMIN 2.1* 2.2* 2.2*  AST 15 31 19   ALT 13 23 19   ALKPHOS 70 84 95  BILITOT 0.4 0.3 0.4  PREALBUMIN 12.7*  --   --   TRIG 79  --   --   CHOL 101  --   --    Estimated Creatinine Clearance: 35.2 ml/min (by C-G formula based on Cr of 1.02).    Recent Labs  03/12/12 2341 03/13/12 0333 03/13/12 0750  GLUCAP 139* 127* 139*    Nutritional Goals:  - RD recs per day: 1420-1660 Kcal, 80-94 g protein, 2L fluid.  - Clinimix E5/20 at 60ml/hr + IVFE 20% at 52ml/hr on MWF to provide: 84g/day protein, 1684Kcal/day (Avg 1478Kcal/day STTHS, 1958Kcal/day MWF).  Current nutrition:  - Diet: NPO since 3/3 - TNA: Clinimix E 5/20 @ goal rate of 70 ml/hr - mIVF: NS at 20 ml/hr  CBGs & Insulin requirements past 24 hours:  - CBGs in goal range of <150 - 5 units  SSI  Labs: Electrolytes: wnl except Na+ slightly low (unable to change TNA content). Corrected Ca2+ at upper limit of normal (10.4). Check Phos tomorrow. Renal Function: Scr wnl, although slowly trending up Hepatic Function: wnl  Pre-Albumin: 7.9 (3/5), 12.7 (3/10) Triglycerides: 80 (3/5), 79 (3/10)   Assessment:  79 yo M with h/o diveriticulitis presented 2/27 with abd pain, N/V and fatigue. CT abd/pelvis in ED with worsened chronic inflammatory process involving the sigmoid colon. Pt admitted for acute on chronic sigmoiditis, diverticulitis, and UTI. GI on board, pt did not improve with Cipro/Flagyl. Repeat CT 3/3 with chronic sigmoid colon thickening, small area of air either contained within diverticulum or escaped from bowel. Surgery, cardiology and pulmonary consulted and investigating perioperative risk.  TNA ordered to start 3/4 given intolerance to enteral feeding.   - 3/12: Today is TNA Day#9, currently at goal rate of 87ml/hr. Cardiology and pulmonary cleared for surgery if needed and Dr. Donell Beers has advised patient that surgery is needed but it is not emergent so patient to think about it further per notes.  Plan continue bowel rest, TNA, abx, repeat imaging in a few days hoping for improvement. 3/9 abd CT: "Chronic bowel wall thickening and inflammation through the  proximal sigmoid colon which may be slightly improved compared to most recent exam. Differential includes chronic diverticulitis, inflammatory bowel disease and cannot exclude neoplasm."  Surgery is still a possibility, patient might be more willing? Patient appears to be tolerating TNA at goal rate. Waiting for decision on long-term plans with TNA vs surgery.   Plan:    Continue Clinimix E 5/20 at goal rate of 70 ml/hr.   TNA to contain IV fat emulsion 20% at 10 ml/hr on MWF only due to ongoing shortage  Standard multivitamins and trace elements on MWF only due to ongoing shortage.  Patient not taking anything by mouth  so unable to convert MVI to PO at this time.  TNA labs Monday/Thursdays  Tomorrow, watch Phos and corrected Ca2+  Awaiting decision on long-term nutrition plan vs surgery.   Darrol Angel, PharmD Pager: [phone] 03/13/2012 8:16 AM

## 2012-03-13 NOTE — Progress Notes (Signed)
CRITICAL VALUE ALERT  Critical value received:  CO2 of 41  Date of notification:  03/13/12  Time of notification:  06:01  Critical value read back:yes  Nurse who received alert:  Christell Faith, RN  MD notified (1st page):  Kirtland Bouchard. Schorr  Time of first page:  06:05  MD notified (2nd page):  Time of second page:  Responding MD:  Merdis Delay  Time MD responded:  06:15   No new orders from MD.

## 2012-03-13 NOTE — Progress Notes (Signed)
[facility] PROGRESS NOTE  Katie Galvan MVH:[id number] DOB: 1927-05-27 DOA: 02/29/2012 PCP: Michele Mcalpine, MD  Brief narrative: 79 year old female with past medical history significant for diverticulitis, ischemic cardiomyopathy (EF 30%), perimyocarditis, COPD, endocarditis, recurrent falls who presented to [facility] ED 02/29/2012 due to abdominal pain, nausea and vomiting. In ED, evaluation included CT abd/pelvis with findings significant for sigmoid diverticulitis worse compared to previous imaging studies. Surgery and GI are assisting management. Per GI and surgery recommendations, patient has had slow improvement with conservative management involving NPO and antibiotics so even with high level of risk patient may benefit from surgery. CT abdomen repeated 03/11/2012 with findings of chronic bowel wall thickening and inflammation through the proximal sigmoid colon  which may be slightly improved compared to most recent exam. Per surgery, flagyl added to current antibiotic regimen and if no significant improvement in next 24 hours plan is for colectomy and colostomy.  Assessment and Plan:   Principal Problem:  *Acute Sigmoid Diverticulitis  Abdominal pain slowly improving.CT abdomen repeated 03/11/2012 with findings of chronic bowel wall thickening and inflammation through the proximal sigmoid colon  which may be slightly improved compared to most recent exam Day 13 of Hospitalization Continue TNA and antibiotics, IV Fluconazole/ Zosyn, IV flagyl for double anaerobic coverage added per [facility] on 3/11 [facility] following, surgical option being considered Supportive care with anti-emetics, pain control  Active Problems:  UTI (lower urinary tract infection)  Secondary to E.Coli  Patient has already completed 5 days of ceftriaxone. Chronic respiratory failure with hypoxia /COPD stable  Continue symbicort  Albuterol Q 6 hours PRN Chronic Systolic and Diastolic CHF  2 D ECHO on this admission with EF  55%  BNP on this admission - slightly elevated at 278  S/p cards eval for clearance  Pancreatic mass  Stable and being followed by GI.   Bilateral lower extremity ulcers  Stable, daily dressing changes  Moderate protein calorie malnutrition  On TNA   Code Status: DNR/DNI  Family Communication: no family at bedside  Disposition Plan: home when stable    Consultants:  Dr. Arlyce Dice ( GI)  Dr. Ezzard Standing (Surgery)  Dr. Molli Knock (Pulm)  Dr. Excell Seltzer (Cardiology) Procedures:  none Antibiotics:  Fluconazole 03/10/2012 --> Zosyn 03/10/2012 --> Flagyl 03/12/2012 -->   [name], MD  [facility]  Pager [phone]   If 7PM-7AM, please contact night-coverage [email or web address] Password [email or web address] 03/13/2012, 3:28 PM   LOS: 13 days    HPI/Subjective: No acute overnight events, still with abd pain, no N/V  Objective: Filed Vitals:   03/13/12 0500 03/13/12 0516 03/13/12 0912 03/13/12 1438  BP:  110/51  116/57  Pulse:  84  87  Temp:  98.7 F (37.1 C)  98.1 F (36.7 C)  TempSrc:  Oral  Oral  Resp:  16  16  Height:      Weight: 62.869 kg (138 lb 9.6 oz)     SpO2:  100% 94% 98%    Intake/Output Summary (Last 24 hours) at 03/13/12 1528 Last data filed at 03/13/12 1500  Gross per 24 hour  Intake 1166.34 ml  Output   1901 ml  Net -734.66 ml    Exam:   General:  Pt is alert, follows commands appropriately, not in acute distress  Cardiovascular: Regular rate and rhythm, S1/S2, no murmurs, no rubs, no gallops  Respiratory: Clear to auscultation bilaterally, no wheezing, no crackles, no rhonchi  Abdomen: Soft, tender in lower abdomen, non distended, bowel sounds present, no guarding  Extremities: No edema,  pulses DP and PT palpable bilaterally  Neuro: Grossly nonfocal  Data Reviewed: Basic Metabolic Panel:  Recent Labs Lab 03/07/12 0354  03/09/12 0445 03/10/12 0505 03/11/12 0700 03/12/12 0347 03/13/12 0430  NA 136  < > 135 132* 136 134* 133*  K 3.8  < > 4.6 4.5 4.8 4.8 4.8   CL 99  < > 100 97 99 99 98  CO2 32  < > 32 29 30 30 29   GLUCOSE 116*  < > 149* 138* 113* 129* 118*  BUN 11  < > 20 23 26* 31* 32*  CREATININE 0.78  < > 0.79 0.78 0.90 1.04 1.02  CALCIUM 8.6  < > 8.7 9.0 8.9 8.8 9.0  MG 1.9  --   --  2.2 2.3  --   --   PHOS 2.7  --   --  3.2 3.6  --   --   < > = values in this interval not displayed. Liver Function Tests:  Recent Labs Lab 03/09/12 0445 03/10/12 0505 03/11/12 0700 03/12/12 0347 03/13/12 0430  AST 14 22 15 31 19   ALT 7 14 13 23 19   ALKPHOS 62 73 70 84 95  BILITOT 0.2* 0.4 0.4 0.3 0.4  PROT 5.6* 5.9* 5.8* 5.9* 6.0  ALBUMIN 2.1* 2.2* 2.1* 2.2* 2.2*   No results found for this basename: LIPASE, AMYLASE,  in the last 168 hours No results found for this basename: AMMONIA,  in the last 168 hours CBC:  Recent Labs Lab 03/09/12 0445 03/10/12 0505 03/11/12 0700 03/12/12 0347 03/13/12 0430  WBC 14.6* 14.5* 11.1* 13.5* 11.2*  NEUTROABS  --   --  7.8*  --   --   HGB 8.4* 8.8* 8.2* 8.2* 7.9*  HCT 26.8* 27.7* 26.3* 25.9* 25.5*  MCV 85.9 86.0 87.4 87.2 87.9  PLT 432* 530* 528* 561* 596*   Cardiac Enzymes: No results found for this basename: CKTOTAL, CKMB, CKMBINDEX, TROPONINI,  in the last 168 hours BNP: No components found with this basename: POCBNP,  CBG:  Recent Labs Lab 03/12/12 1954 03/12/12 2341 03/13/12 0333 03/13/12 0750 03/13/12 1151  GLUCAP 110* 139* 127* 139* 131*    Recent Results (from the past 240 hour(s))  STOOL CULTURE     Status: None   Collection Time    03/08/12  6:46 AM      Result Value Range Status   Specimen Description STOOL   Final   Special Requests Normal   Final   Culture     Final   Value: NO SALMONELLA, SHIGELLA, CAMPYLOBACTER, YERSINIA, OR E.COLI 0157:H7 ISOLATED     Note: REDUCED NORMAL FLORA PRESENT   Report Status 03/12/2012 FINAL   Final     Studies: Ct Abdomen Pelvis W Contrast 03/11/2012  * IMPRESSION:  1. Chronic bowel wall thickening and inflammation through the proximal  sigmoid colon  which may be slightly improved compared to most recent exam.  Differential includes chronic diverticulitis, inflammatory bowel disease and cannot exclude neoplasm.  Recommend follow up imaging or colonoscopy. 2.  Small amount free fluid the pelvis is slightly increased. 3.  Slight improvement in right pleural effusion.      Scheduled Meds: . antiseptic oral rinse  15 mL Mouth Rinse q12n4p  . budesonide-formoterol  1 puff Inhalation BID  . chlorhexidine  15 mL Mouth Rinse BID  . fluconazole (DIFLUCAN) IV  200 mg Intravenous Q24H  . heparin  5,000 Units Subcutaneous Q8H  . insulin aspart  0-9 Units Subcutaneous  Q4H  . metronidazole  500 mg Intravenous Q8H  . ondansetron (ZOFRAN) IV  8 mg Intravenous Q6H  . piperacillin-tazobactam (ZOSYN)  IV  3.375 g Intravenous Q8H  . sodium chloride  3 mL Intravenous Q12H  . tiotropium  18 mcg Inhalation Daily   Continuous Infusions: . sodium chloride 20 mL/hr at 03/12/12 0248  . sodium chloride 20 mL/hr at 03/10/12 2142  . fat emulsion    . TPN (CLINIMIX) +/- additives 70 mL/hr at 03/12/12 1728  . TPN (CLINIMIX) +/- additives

## 2012-03-13 NOTE — Progress Notes (Signed)
PT Cancellation Note  Patient Details Name: Katie Galvan MRN: [id number] DOB: Mar 22, 1927   Cancelled Treatment:    Reason Eval/Treat Not Completed: Fatigue/lethargy limiting ability to participate Pt reports she didn't get any sleep last night and is too tired today.  Also reports she would like to rest for surgery tomorrow.  Will likely check back on Friday.   LEMYRE,[name] 03/13/2012, 1:44 PM Pager: [phone]

## 2012-03-13 NOTE — Progress Notes (Signed)
The pt's exam is relatively unchanged. She continues to have diffuse tenderness that is worse in LLQ. She has no fever and her WBC is trending down. She is now considering surgery but will not agree to it until tomorrow. We will continue to see how she does on abx and plan for sigmoid colectomy when she is ready unless she makes some dramatic improvement

## 2012-03-13 NOTE — Progress Notes (Signed)
Patient ID: Katie Galvan, female   DOB: 28-Dec-1927, 79 y.o.   MRN: [id number] P   Subjective: Abd much sorer last night and this am, denies v/n/fevers/chills  Objective: Vital signs in last 24 hours: Temp:  [97.3 F (36.3 C)-99.2 F (37.3 C)] 98.7 F (37.1 C) (03/12 0516) Pulse Rate:  [82-87] 84 (03/12 0516) Resp:  [16-18] 16 (03/12 0516) BP: (92-110)/(45-70) 110/51 mmHg (03/12 0516) SpO2:  [94 %-100 %] 94 % (03/12 0912) Weight:  [138 lb 9.6 oz (62.869 kg)] 138 lb 9.6 oz (62.869 kg) (03/12 0500) Last BM Date: 03/11/12  Intake/Output from previous day: 03/11 0701 - 03/12 0700 In: 1766.3 [I.V.:593.3; TPN:1173] Out: 2311 [Urine:2310; Stool:1] Intake/Output this shift: Total I/O In: -  Out: 200 [Urine:200]  PE: Abd: soft, more diffusely tender then yesterday General: awake, alert, On O2, appears weak.  Lab Results:   Recent Labs  03/12/12 0347 03/13/12 0430  WBC 13.5* 11.2*  HGB 8.2* 7.9*  HCT 25.9* 25.5*  PLT 561* 596*   BMET  Recent Labs  03/12/12 0347 03/13/12 0430  NA 134* 133*  K 4.8 4.8  CL 99 98  CO2 30 29  GLUCOSE 129* 118*  BUN 31* 32*  CREATININE 1.04 1.02  CALCIUM 8.8 9.0   PT/INR No results found for this basename: LABPROT, INR,  in the last 72 hours CMP     Component Value Date/Time   NA 133* 03/13/2012 0430   NA 139 11/27/2011 1533   K 4.8 03/13/2012 0430   K 4.1 11/27/2011 1533   CL 98 03/13/2012 0430   CL 100 11/27/2011 1533   CO2 29 03/13/2012 0430   CO2 31* 11/27/2011 1533   GLUCOSE 118* 03/13/2012 0430   GLUCOSE 97 11/27/2011 1533   BUN 32* 03/13/2012 0430   BUN 22.0 11/27/2011 1533   CREATININE 1.02 03/13/2012 0430   CREATININE 0.9 11/27/2011 1533   CALCIUM 9.0 03/13/2012 0430   CALCIUM 10.0 11/27/2011 1533   PROT 6.0 03/13/2012 0430   PROT 7.1 11/27/2011 1533   ALBUMIN 2.2* 03/13/2012 0430   ALBUMIN 3.4* 11/27/2011 1533   AST 19 03/13/2012 0430   AST 16 11/27/2011 1533   ALT 19 03/13/2012 0430   ALT 11 11/27/2011 1533   ALKPHOS 95 03/13/2012 0430   ALKPHOS 73 11/27/2011 1533   BILITOT 0.4 03/13/2012 0430   BILITOT 0.34 11/27/2011 1533   GFRNONAA 50* 03/13/2012 0430   GFRAA 58* 03/13/2012 0430   Lipase     Component Value Date/Time   LIPASE 8* 11/08/2011 1539       Studies/Results: No results found.  Anti-infectives: Anti-infectives   Start     Dose/Rate Route Frequency Ordered Stop   03/12/12 1000  metroNIDAZOLE (FLAGYL) IVPB 500 mg     500 mg 100 mL/hr over 60 Minutes Intravenous 3 times per day 03/12/12 0908     03/10/12 1000  piperacillin-tazobactam (ZOSYN) IVPB 3.375 g     3.375 g 12.5 mL/hr over 240 Minutes Intravenous Every 8 hours 03/10/12 0851     03/10/12 0815  piperacillin-tazobactam (ZOSYN) IVPB 3.375 g  Status:  Discontinued     3.375 g 12.5 mL/hr over 240 Minutes Intravenous Every 8 hours 03/10/12 0807 03/10/12 0851   03/10/12 0815  fluconazole (DIFLUCAN) IVPB 200 mg     200 mg 100 mL/hr over 60 Minutes Intravenous Every 24 hours 03/10/12 0807     03/06/12 1200  ertapenem (INVANZ) 1 g in sodium chloride 0.9 % 50 mL IVPB  Status:  Discontinued     1 g 100 mL/hr over 30 Minutes Intravenous Every 24 hours 03/06/12 1059 03/10/12 0807   03/03/12 1200  cefTRIAXone (ROCEPHIN) 1 g in dextrose 5 % 50 mL IVPB  Status:  Discontinued     1 g 100 mL/hr over 30 Minutes Intravenous Every 24 hours 03/03/12 0958 03/06/12 1059   02/29/12 2000  metroNIDAZOLE (FLAGYL) IVPB 500 mg  Status:  Discontinued     500 mg 100 mL/hr over 60 Minutes Intravenous Every 8 hours 02/29/12 1919 03/06/12 1059   02/29/12 2000  ciprofloxacin (CIPRO) IVPB 400 mg  Status:  Discontinued     400 mg 200 mL/hr over 60 Minutes Intravenous Every 12 hours 02/29/12 1925 03/03/12 0958   02/29/12 1745  cefTRIAXone (ROCEPHIN) 1 g in dextrose 5 % 50 mL IVPB     1 g 100 mL/hr over 30 Minutes Intravenous  Once 02/29/12 1744 02/29/12 1859   02/29/12 1745  metroNIDAZOLE (FLAGYL) IVPB 500 mg  Status:  Discontinued     500 mg 100  mL/hr over 60 Minutes Intravenous  Once 02/29/12 1744 02/29/12 1929   02/29/12 1745  ciprofloxacin (CIPRO) IVPB 400 mg  Status:  Discontinued     400 mg 200 mL/hr over 60 Minutes Intravenous  Once 02/29/12 1744 02/29/12 1928       Assessment/Plan 1. Diverticulitis vs colitis of sigmoid colon: CT scan - 03/04/2012 - chronic sigmoid colon thickening over about 10 cm (similar to CT scan 03/01/2011 and may be a little worse than 10/31/2011),  WBC stable.  More diffusely tender and she appears very weak.  She likely needs to go ahead with surgery and she is now ready to pursue that, Dr. Carolynne Edouard will be by to see the patient shortly.      LOS: 13 days    WHITE, ELIZABETH 03/13/2012

## 2012-03-13 NOTE — Progress Notes (Signed)
Pt initial am assess unchanged. Cont with current plan of care. Condition stable.

## 2012-03-14 ENCOUNTER — Encounter (HOSPITAL_COMMUNITY)
Admission: EM | Disposition: A | Discharge status: Skilled Nursing Facility | Payer: Self-pay | Source: Home / Self Care | Attending: Internal Medicine

## 2012-03-14 ENCOUNTER — Telehealth: Payer: Self-pay | Admitting: Pulmonary Disease

## 2012-03-14 ENCOUNTER — Inpatient Hospital Stay (HOSPITAL_COMMUNITY): Payer: Medicare Other | Admitting: *Deleted

## 2012-03-14 ENCOUNTER — Encounter (HOSPITAL_COMMUNITY): Payer: Self-pay | Admitting: *Deleted

## 2012-03-14 HISTORY — PX: COLOSTOMY REVISION: SHX5232

## 2012-03-14 HISTORY — PX: COLOSTOMY: SHX63

## 2012-03-14 LAB — GLUCOSE, CAPILLARY

## 2012-03-14 LAB — COMPREHENSIVE METABOLIC PANEL
ALT: 13 U/L (ref 0–35)
AST: 12 U/L (ref 0–37)
Calcium: 9.4 mg/dL (ref 8.4–10.5)
GFR calc Af Amer: 67 mL/min — ABNORMAL LOW (ref >90)
Glucose, Bld: 131 mg/dL — ABNORMAL HIGH (ref 70–99)
Sodium: 136 mEq/L (ref 135–145)
Total Protein: 6.6 g/dL (ref 6.0–8.3)

## 2012-03-14 LAB — CBC
HCT: 28 % — ABNORMAL LOW (ref 36.0–46.0)
Hemoglobin: 8.7 g/dL — ABNORMAL LOW (ref 12.0–15.0)
MCHC: 31.1 g/dL (ref 30.0–36.0)

## 2012-03-14 LAB — PHOSPHORUS: Phosphorus: 2.9 mg/dL (ref 2.3–4.6)

## 2012-03-14 LAB — MRSA PCR SCREENING: MRSA by PCR: NEGATIVE

## 2012-03-14 SURGERY — COLECTOMY, SIGMOID, OPEN
Anesthesia: General | Site: Abdomen | Wound class: Contaminated

## 2012-03-14 MED ORDER — CLINIMIX E/DEXTROSE (5/20) 5 % IV SOLN
INTRAVENOUS | Status: AC
  Administered 2012-03-14: 17:00:00 via INTRAVENOUS
  Administered 2012-03-14: 70 mL/h via INTRAVENOUS
  Filled 2012-03-14: qty 2000

## 2012-03-14 MED ORDER — SUCCINYLCHOLINE CHLORIDE 20 MG/ML IJ SOLN
INTRAMUSCULAR | Status: DC | PRN
  Administered 2012-03-14: 100 mg via INTRAVENOUS

## 2012-03-14 MED ORDER — LABETALOL HCL 5 MG/ML IV SOLN
INTRAVENOUS | Status: DC | PRN
  Administered 2012-03-14: 5 mg via INTRAVENOUS
  Administered 2012-03-14: 2.5 mg via INTRAVENOUS

## 2012-03-14 MED ORDER — DEXAMETHASONE SODIUM PHOSPHATE 10 MG/ML IJ SOLN
INTRAMUSCULAR | Status: DC | PRN
  Administered 2012-03-14: 10 mg via INTRAVENOUS

## 2012-03-14 MED ORDER — 0.9 % SODIUM CHLORIDE (POUR BTL) OPTIME
TOPICAL | Status: DC | PRN
  Administered 2012-03-14 (×2): 2000 mL

## 2012-03-14 MED ORDER — NEOSTIGMINE METHYLSULFATE 1 MG/ML IJ SOLN
INTRAMUSCULAR | Status: DC | PRN
  Administered 2012-03-14: 3 mg via INTRAVENOUS

## 2012-03-14 MED ORDER — CISATRACURIUM BESYLATE (PF) 10 MG/5ML IV SOLN
INTRAVENOUS | Status: DC | PRN
  Administered 2012-03-14: 2 mg via INTRAVENOUS
  Administered 2012-03-14: 8 mg via INTRAVENOUS
  Administered 2012-03-14: 2 mg via INTRAVENOUS

## 2012-03-14 MED ORDER — HYDROMORPHONE HCL PF 1 MG/ML IJ SOLN: 0.5000 mg | INTRAMUSCULAR | Status: DC | PRN

## 2012-03-14 MED ORDER — ACETAMINOPHEN 10 MG/ML IV SOLN
1000.0000 mg | Freq: Four times a day (QID) | INTRAVENOUS | Status: DC
  Administered 2012-03-14 – 2012-03-15 (×2): 1000 mg via INTRAVENOUS
  Filled 2012-03-14 (×3): qty 100

## 2012-03-14 MED ORDER — LACTATED RINGERS IV SOLN: INTRAVENOUS | Status: DC

## 2012-03-14 MED ORDER — HYDROMORPHONE HCL PF 1 MG/ML IJ SOLN
0.2500 mg | INTRAMUSCULAR | Status: DC | PRN
  Administered 2012-03-14 (×4): 0.5 mg via INTRAVENOUS

## 2012-03-14 MED ORDER — LACTATED RINGERS IV SOLN
INTRAVENOUS | Status: DC | PRN
  Administered 2012-03-14: 13:00:00 via INTRAVENOUS

## 2012-03-14 MED ORDER — LIDOCAINE HCL (CARDIAC) 20 MG/ML IV SOLN
INTRAVENOUS | Status: DC | PRN
  Administered 2012-03-14: 50 mg via INTRAVENOUS

## 2012-03-14 MED ORDER — ONDANSETRON HCL 4 MG/2ML IJ SOLN
INTRAMUSCULAR | Status: DC | PRN
  Administered 2012-03-14 (×2): 2 mg via INTRAVENOUS

## 2012-03-14 MED ORDER — FENTANYL CITRATE 0.05 MG/ML IJ SOLN
25.0000 ug | INTRAMUSCULAR | Status: DC | PRN
  Administered 2012-03-14 (×5): 50 ug via INTRAVENOUS
  Filled 2012-03-14 (×5): qty 2

## 2012-03-14 MED ORDER — FENTANYL CITRATE 0.05 MG/ML IJ SOLN: 25.0000 ug | INTRAMUSCULAR | Status: DC | PRN

## 2012-03-14 MED ORDER — FENTANYL CITRATE 0.05 MG/ML IJ SOLN
INTRAMUSCULAR | Status: DC | PRN
  Administered 2012-03-14 (×2): 25 ug via INTRAVENOUS
  Administered 2012-03-14: 50 ug via INTRAVENOUS
  Administered 2012-03-14 (×2): 25 ug via INTRAVENOUS
  Administered 2012-03-14: 50 ug via INTRAVENOUS
  Administered 2012-03-14 (×2): 25 ug via INTRAVENOUS

## 2012-03-14 MED ORDER — PHENYLEPHRINE HCL 10 MG/ML IJ SOLN
INTRAMUSCULAR | Status: DC | PRN
  Administered 2012-03-14: 40 ug via INTRAVENOUS
  Administered 2012-03-14 (×2): 20 ug via INTRAVENOUS

## 2012-03-14 MED ORDER — HYDROMORPHONE HCL PF 1 MG/ML IJ SOLN
0.5000 mg | Freq: Once | INTRAMUSCULAR | Status: AC
  Administered 2012-03-15: 0.5 mg via INTRAVENOUS
  Filled 2012-03-14: qty 1

## 2012-03-14 MED ORDER — PROPOFOL 10 MG/ML IV EMUL
INTRAVENOUS | Status: DC | PRN
  Administered 2012-03-14: 90 mg via INTRAVENOUS
  Administered 2012-03-14: 30 mg via INTRAVENOUS

## 2012-03-14 MED ORDER — GLYCOPYRROLATE 0.2 MG/ML IJ SOLN
INTRAMUSCULAR | Status: DC | PRN
  Administered 2012-03-14: .3 mg via INTRAVENOUS

## 2012-03-14 MED ORDER — SODIUM CHLORIDE 0.9 % IV SOLN
INTRAVENOUS | Status: DC | PRN
  Administered 2012-03-14: 15:00:00 via INTRAVENOUS

## 2012-03-14 SURGICAL SUPPLY — 53 items
APPLICATOR COTTON TIP 6IN STRL (MISCELLANEOUS) ×4 IMPLANT
BLADE EXTENDED COATED 6.5IN (ELECTRODE) ×2 IMPLANT
BLADE HEX COATED 2.75 (ELECTRODE) ×2 IMPLANT
BLADE SURG SZ10 CARB STEEL (BLADE) ×2 IMPLANT
CANISTER SUCTION 2500CC (MISCELLANEOUS) ×2 IMPLANT
CLIP TI LARGE 6 (CLIP) IMPLANT
CLOTH BEACON ORANGE TIMEOUT ST (SAFETY) ×2 IMPLANT
COUNTER NEEDLE 20 DBL MAG RED (NEEDLE) ×2 IMPLANT
COVER MAYO STAND STRL (DRAPES) ×2 IMPLANT
DRAPE LAPAROSCOPIC ABDOMINAL (DRAPES) ×2 IMPLANT
DRAPE LG THREE QUARTER DISP (DRAPES) IMPLANT
DRAPE WARM FLUID 44X44 (DRAPE) ×2 IMPLANT
ELECT REM PT RETURN 9FT ADLT (ELECTROSURGICAL) ×2
ELECTRODE REM PT RTRN 9FT ADLT (ELECTROSURGICAL) ×1 IMPLANT
GLOVE BIO SURGEON STRL SZ7.5 (GLOVE) ×4 IMPLANT
GLOVE BIOGEL PI IND STRL 7.0 (GLOVE) ×1 IMPLANT
GLOVE BIOGEL PI INDICATOR 7.0 (GLOVE) ×1
GOWN PREVENTION PLUS XLARGE (GOWN DISPOSABLE) ×2 IMPLANT
GOWN STRL NON-REIN LRG LVL3 (GOWN DISPOSABLE) ×6 IMPLANT
GOWN STRL REIN XL XLG (GOWN DISPOSABLE) ×4 IMPLANT
HAND ACTIVATED (MISCELLANEOUS) IMPLANT
KIT BASIN OR (CUSTOM PROCEDURE TRAY) ×2 IMPLANT
LEGGING LITHOTOMY PAIR STRL (DRAPES) IMPLANT
LIGASURE IMPACT 36 18CM CVD LR (INSTRUMENTS) ×2 IMPLANT
NS IRRIG 1000ML POUR BTL (IV SOLUTION) ×4 IMPLANT
PACK GENERAL/GYN (CUSTOM PROCEDURE TRAY) ×2 IMPLANT
RELOAD PROXIMATE 75MM BLUE (ENDOMECHANICALS) ×2 IMPLANT
SPONGE GAUZE 4X4 12PLY (GAUZE/BANDAGES/DRESSINGS) ×2 IMPLANT
SPONGE LAP 18X18 X RAY DECT (DISPOSABLE) ×2 IMPLANT
STAPLER CUT CVD 40MM GREEN (STAPLE) ×2 IMPLANT
STAPLER PROXIMATE 75MM BLUE (STAPLE) ×2 IMPLANT
STAPLER VISISTAT 35W (STAPLE) ×2 IMPLANT
SUCTION POOLE TIP (SUCTIONS) ×2 IMPLANT
SUT NOV 1 T60/GS (SUTURE) IMPLANT
SUT NOVA T20/GS 25 (SUTURE) IMPLANT
SUT PDS AB 1 CTX 36 (SUTURE) IMPLANT
SUT PDS AB 1 TP1 54 (SUTURE) IMPLANT
SUT PDS AB 1 TP1 96 (SUTURE) ×4 IMPLANT
SUT PROLENE 2 0 BLUE (SUTURE) ×2 IMPLANT
SUT PROLENE 2 0 KS (SUTURE) IMPLANT
SUT SILK 2 0 (SUTURE) ×1
SUT SILK 2 0 SH CR/8 (SUTURE) ×2 IMPLANT
SUT SILK 2 0SH CR/8 30 (SUTURE) IMPLANT
SUT SILK 2-0 18XBRD TIE 12 (SUTURE) ×1 IMPLANT
SUT SILK 2-0 30XBRD TIE 12 (SUTURE) IMPLANT
SUT SILK 3 0 (SUTURE) ×1
SUT SILK 3 0 SH CR/8 (SUTURE) ×2 IMPLANT
SUT SILK 3-0 18XBRD TIE 12 (SUTURE) ×1 IMPLANT
SUT VIC AB 3-0 SH 8-18 (SUTURE) ×4 IMPLANT
TOWEL OR 17X26 10 PK STRL BLUE (TOWEL DISPOSABLE) ×4 IMPLANT
TRAY FOLEY CATH 14FRSI W/METER (CATHETERS) ×2 IMPLANT
WATER STERILE IRR 1500ML POUR (IV SOLUTION) IMPLANT
YANKAUER SUCT BULB TIP NO VENT (SUCTIONS) ×2 IMPLANT

## 2012-03-14 NOTE — Telephone Encounter (Signed)
Will forward to SN to make him aware that the pt made it through the surgery ok per [name].

## 2012-03-14 NOTE — Progress Notes (Signed)
[facility] ([facility]) notified of patient's pain 9/10 despite 2 dosages of fentanyl. SBP 180s. RR 20s-30s. Continue q1hr pushes and start tylenol IV q6h.

## 2012-03-14 NOTE — Progress Notes (Signed)
PARENTERAL NUTRITION CONSULT NOTE - FOLLOW UP  Pharmacy Consult for TNA Indication: Intolerance to enteral feeding (Diverticulitis vs colitis of sigmoid colon)  Allergies  Allergen Reactions  . Sulfonamide Derivatives Swelling    Patient Measurements: Height: 5' (152.4 cm) Weight: 139 lb (63.05 kg) IBW/kg (Calculated) : 45.5 Usual Weight: 65 kg  Vital Signs: Temp: 98.7 F (37.1 C) (03/13 0500) Temp src: Oral (03/13 0500) BP: 136/60 mmHg (03/13 0500) Pulse Rate: 88 (03/13 0500) Intake/Output from previous day: 03/12 0701 - 03/13 0700 In: 1640 [I.V.:160; IV Piggyback:200; TPN:1280] Out: 550 [Urine:550] Intake/Output from this shift:    Labs:  Recent Labs  03/12/12 0347 03/13/12 0430 03/14/12 0600  WBC 13.5* 11.2* 13.2*  HGB 8.2* 7.9* 8.7*  HCT 25.9* 25.5* 28.0*  PLT 561* 596* 624*     Recent Labs  03/12/12 0347 03/13/12 0430 03/14/12 0600  NA 134* 133* 136  K 4.8 4.8 4.4  CL 99 98 103  CO2 30 29 28   GLUCOSE 129* 118* 131*  BUN 31* 32* 28*  CREATININE 1.04 1.02 0.90  CALCIUM 8.8 9.0 9.4  MG  --   --  2.3  PHOS  --   --  2.9  PROT 5.9* 6.0 6.6  ALBUMIN 2.2* 2.2* 2.3*  AST 31 19 12   ALT 23 19 13   ALKPHOS 84 95 99  BILITOT 0.3 0.4 0.3   Estimated Creatinine Clearance: 39.9 ml/min (by C-G formula based on Cr of 0.9).    Recent Labs  03/13/12 2338 03/14/12 0408 03/14/12 0736  GLUCAP 145* 128* 132*    Nutritional Goals:  - RD recs per day: 1420-1660 Kcal, 80-94 g protein, 2L fluid.  - Clinimix E5/20 at 86ml/hr + IVFE 20% at 67ml/hr on MWF to provide: 84g/day protein, 1684Kcal/day (Avg 1478Kcal/day STTHS, 1958Kcal/day MWF).  Current nutrition:  - Diet: NPO since 3/3 - TNA: Clinimix E 5/20 @ goal rate of 70 ml/hr - mIVF: NS at 20 ml/hr  CBGs & Insulin requirements past 24 hours:  - CBGs in goal range of <150 - 7 units SSI  Labs: Electrolytes: labs stable Renal Function: Scr wnl, although slowly trending up Hepatic Function: wnl   Pre-Albumin: 7.9 (3/5), 12.7 (3/10) Triglycerides: 80 (3/5), 79 (3/10)   Assessment:  79 yo M with h/o diveriticulitis presented 2/27 with abd pain, N/V and fatigue. CT abd/pelvis in ED with worsened chronic inflammatory process involving the sigmoid colon. Pt admitted for acute on chronic sigmoiditis, diverticulitis, and UTI. GI on board, pt did not improve with Cipro/Flagyl. Repeat CT 3/3 with chronic sigmoid colon thickening, small area of air either contained within diverticulum or escaped from bowel. Surgery, cardiology and pulmonary consulted and investigating perioperative risk.  TNA ordered to start 3/4 given intolerance to enteral feeding.   - 3/13: Today is TNA Day#10, currently at goal rate of 65ml/hr. Cardiology and pulmonary cleared for surgery if needed and Dr. Donell Beers has advised patient that surgery is needed and per Dr. Carolynne Edouard today 3/13 patient has elected to proceed with sigmoid colectomy and colostomy.  Patient appears to be tolerating TNA at goal rate.   Plan:    Continue Clinimix E 5/20 at goal rate of 70 ml/hr.   TNA to contain IV fat emulsion 20% at 10 ml/hr on MWF only due to ongoing shortage  Standard multivitamins and trace elements on MWF only due to ongoing shortage.  Patient not taking anything by mouth so unable to convert MVI to PO at this time.  TNA labs  Monday/Thursdays  Follow up s/p surgery   Hessie Knows, PharmD, BCPS Pager [phone] 03/14/2012 10:29 AM

## 2012-03-14 NOTE — Op Note (Signed)
02/29/2012 - 03/14/2012  3:37 PM  PATIENT:  Katie Galvan  79 y.o. female  PRE-OPERATIVE DIAGNOSIS:  diverticulitis  POST-OPERATIVE DIAGNOSIS:  diverticulitis  PROCEDURE:  Procedure(s) with comments: COLON RESECTION SIGMOID (N/A) - left colectomy with mobilization of splenic flexure COLOSTOMY (N/A)  SURGEON:  Surgeon(s) and Role:    * Robyne Askew, MD - Primary    * Adolph Pollack, MD - Assisting  PHYSICIAN ASSISTANT:   ASSISTANTS: Dr. Abbey Chatters   ANESTHESIA:   general  EBL:  Total I/O In: 1350 [I.V.:1000; Blood:350] Out: 505 [Urine:180; Blood:325]  BLOOD ADMINISTERED:250 CC PRBC  DRAINS: none   LOCAL MEDICATIONS USED:  NONE  SPECIMEN:  Source of Specimen:  sigmoid colon and left colon  DISPOSITION OF SPECIMEN:  PATHOLOGY  COUNTS:  YES  TOURNIQUET:  * No tourniquets in log *  DICTATION: .Dragon Dictation After informed consent was obtained patient was brought to the operating room and placed in the supine position on the operating table. After adequate induction of general anesthesia the patient's abdomen was prepped with ChloraPrep, allowed to dry, and draped in usual sterile manner. A midline incision was made with a 10 blade knife. This incision was carried through the skin and subcutaneous tissue sharply with electrocautery until the linea alba was identified. The linea alba was incised with electrocautery. The preperitoneal space was probed with hemostat until the peritoneum was opened and access was gained to the abdominal cavity. The rest of the incision was then opened under direct vision. A Balfour retractor was deployed and the small bowel was tucked in the upper abdomen. The abdomen was generally inspected. There was a long segment of inflamed sigmoid colon and the left lower quadrant. Much of the sigmoid colon was mobilized by blunt finger fracture dissection. Once we were able to identify the upper rectum this segment of rectum appeared soft and viable.  The mesentery at this point was opened sharply with the electrocautery and blunt finger dissection. A Contour green load stapler was then placed across the upper rectum clamped and fired thereby dividing the rectum between staple lines. The mesentery to the sigmoid colon was then taken down by blunt right angle dissection and some sharp dissection with the LigaSure. Once this was accomplished the sigmoid colon was removed from the patient. It was marked with a stitch on the distal aspect. The rectal stump was marked with 2 2-0 Prolene stitches. We then examined the left colon the left colon did not appear to be healthy secondary to inspissated stool. We then mobilized the left colon with the splenic flexure by incising its retroperitoneal attachment along the white line of Toldt. Once the splenic flexure was up in the air we examined the transverse colon which did appear to be healthy. We chose a spot along the midportion of the transverse colon to divide the colon. The mesentery at this point was opened sharply with the electrocautery. A GI 75 stapler was placed across the colon at this point clamped and fired thereby dividing the colon between staple lines. The mesentery to the splenic flexure and left colon were then taken down sharply with the LigaSure. The left colon was then removed. A site was chosen to bring out the ostomy in the left upper quadrant. A circular portion of skin with some subcutaneous fat was excised sharply with a 10 blade knife and electrocautery. A small incision was made in the fascia of the abdominal wall so that 2 fingers could come through easily. A  Tanja Port was placed through this opening and used to grasp the staple line of the transverse colon. The colon was brought through this opening without difficulty. The colon was anchored to the fascia of the abdominal wall from the inside of the abdomen with 3-0 Vicryl stitches. Next the abdomen was irrigated with copious amounts of saline.  The operative bed was examined and found to be hemostatic. The fascia of the abdominal wall was then closed with 2 running #1 double-stranded looped PDS sutures. The subcutaneous tissue was irrigated with copious amounts of saline. The skin was then closed with staples. The ostomy was then matured with 3-0 Vicryl stitches. The patient tolerated procedure well. Sterile dressings were applied. At the end of the case all needle sponge and instrument counts were correct. The patient was then awakened and taken to recovery in stable condition.  PLAN OF CARE: Admit to inpatient   PATIENT DISPOSITION:  PACU - hemodynamically stable.   Delay start of Pharmacological VTE agent (>24hrs) due to surgical blood loss or risk of bleeding: yes

## 2012-03-14 NOTE — Anesthesia Preprocedure Evaluation (Addendum)
Anesthesia Evaluation  Patient identified by MRN, date of birth, ID band Patient awake    Reviewed: Allergy & Precautions, H&P , NPO status , Patient's Chart, lab work & pertinent test results  Airway Mallampati: II TM Distance: >3 FB Neck ROM: full    Dental no notable dental hx. (+) Teeth Intact and Dental Advisory Given   Pulmonary COPD Right hemidiaphragm elevation. COPD not severe breath sounds clear to auscultation  Pulmonary exam normal       Cardiovascular Exercise Tolerance: Poor hypertension, Pt. on medications and Pt. on home beta blockers +CHF Rhythm:regular Rate:Normal  CHF systolic and diastolic.  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 MR   Neuro/Psych Depression negative neurological ROS  negative psych ROS   GI/Hepatic negative GI ROS, Neg liver ROS, hiatal hernia,   Endo/Other  negative endocrine ROS  Renal/GU negative Renal ROS  negative genitourinary   Musculoskeletal   Abdominal   Peds  Hematology negative hematology ROS (+) Hgb 8.7   Anesthesia Other Findings   Reproductive/Obstetrics negative OB ROS                          Anesthesia Physical Anesthesia Plan  ASA: IV  Anesthesia Plan: General   Post-op Pain Management:    Induction: Intravenous  Airway Management Planned: Oral ETT  Additional Equipment:   Intra-op Plan:   Post-operative Plan: Possible Post-op intubation/ventilation  Informed Consent: I have reviewed the patients History and Physical, chart, labs and discussed the procedure including the risks, benefits and alternatives for the proposed anesthesia with the patient or authorized representative who has indicated his/her understanding and acceptance.   Dental Advisory Given  Plan Discussed with: CRNA and Surgeon  Anesthesia Plan Comments:         Anesthesia Quick Evaluation

## 2012-03-14 NOTE — Progress Notes (Signed)
Subjective: Pt says her pain is unchanged. She says she is ready for surgery now  Objective: Vital signs in last 24 hours: Temp:  [98.1 F (36.7 C)-98.7 F (37.1 C)] 98.7 F (37.1 C) (03/13 0500) Pulse Rate:  [83-88] 88 (03/13 0500) Resp:  [16] 16 (03/13 0500) BP: (116-136)/(57-62) 136/60 mmHg (03/13 0500) SpO2:  [94 %-100 %] 100 % (03/13 0500) Weight:  [139 lb (63.05 kg)] 139 lb (63.05 kg) (03/13 0500) Last BM Date: 03/13/12 (smear)  Intake/Output from previous day: 03/12 0701 - 03/13 0700 In: 1640 [I.V.:160; IV Piggyback:200; TPN:1280] Out: 550 [Urine:550] Intake/Output this shift:    GI: diffuse tenderness, worse in LLQ  Lab Results:   Recent Labs  03/13/12 0430 03/14/12 0600  WBC 11.2* 13.2*  HGB 7.9* 8.7*  HCT 25.5* 28.0*  PLT 596* 624*   BMET  Recent Labs  03/13/12 0430 03/14/12 0600  NA 133* 136  K 4.8 4.4  CL 98 103  CO2 29 28  GLUCOSE 118* 131*  BUN 32* 28*  CREATININE 1.02 0.90  CALCIUM 9.0 9.4   PT/INR No results found for this basename: LABPROT, INR,  in the last 72 hours ABG No results found for this basename: PHART, PCO2, PO2, HCO3,  in the last 72 hours  Studies/Results: No results found.  Anti-infectives: Anti-infectives   Start     Dose/Rate Route Frequency Ordered Stop   03/12/12 1000  metroNIDAZOLE (FLAGYL) IVPB 500 mg     500 mg 100 mL/hr over 60 Minutes Intravenous 3 times per day 03/12/12 0908     03/10/12 1000  piperacillin-tazobactam (ZOSYN) IVPB 3.375 g     3.375 g 12.5 mL/hr over 240 Minutes Intravenous Every 8 hours 03/10/12 0851     03/10/12 0815  piperacillin-tazobactam (ZOSYN) IVPB 3.375 g  Status:  Discontinued     3.375 g 12.5 mL/hr over 240 Minutes Intravenous Every 8 hours 03/10/12 0807 03/10/12 0851   03/10/12 0815  fluconazole (DIFLUCAN) IVPB 200 mg     200 mg 100 mL/hr over 60 Minutes Intravenous Every 24 hours 03/10/12 0807     03/06/12 1200  ertapenem (INVANZ) 1 g in sodium chloride 0.9 % 50 mL IVPB   Status:  Discontinued     1 g 100 mL/hr over 30 Minutes Intravenous Every 24 hours 03/06/12 1059 03/10/12 0807   03/03/12 1200  cefTRIAXone (ROCEPHIN) 1 g in dextrose 5 % 50 mL IVPB  Status:  Discontinued     1 g 100 mL/hr over 30 Minutes Intravenous Every 24 hours 03/03/12 0958 03/06/12 1059   02/29/12 2000  metroNIDAZOLE (FLAGYL) IVPB 500 mg  Status:  Discontinued     500 mg 100 mL/hr over 60 Minutes Intravenous Every 8 hours 02/29/12 1919 03/06/12 1059   02/29/12 2000  ciprofloxacin (CIPRO) IVPB 400 mg  Status:  Discontinued     400 mg 200 mL/hr over 60 Minutes Intravenous Every 12 hours 02/29/12 1925 03/03/12 0958   02/29/12 1745  cefTRIAXone (ROCEPHIN) 1 g in dextrose 5 % 50 mL IVPB     1 g 100 mL/hr over 30 Minutes Intravenous  Once 02/29/12 1744 02/29/12 1859   02/29/12 1745  metroNIDAZOLE (FLAGYL) IVPB 500 mg  Status:  Discontinued     500 mg 100 mL/hr over 60 Minutes Intravenous  Once 02/29/12 1744 02/29/12 1929   02/29/12 1745  ciprofloxacin (CIPRO) IVPB 400 mg  Status:  Discontinued     400 mg 200 mL/hr over 60 Minutes Intravenous  Once  02/29/12 1744 02/29/12 1928      Assessment/Plan: s/p * No surgery found * Continue abx Since she has not improved I think she would benefit from sigmoid colectomy and colostomy. I have discussed with her the risks and benefits of surgery as well as some of the technical aspects and she understands and wishes to proceed.  LOS: 14 days    TOTH III,PAUL S 03/14/2012

## 2012-03-14 NOTE — Anesthesia Postprocedure Evaluation (Signed)
  Anesthesia Post-op Note  Patient: Katie Galvan  Procedure(s) Performed: Procedure(s) (LRB): COLON RESECTION SIGMOID (N/A) COLOSTOMY (N/A)  Patient Location: PACU  Anesthesia Type: General  Level of Consciousness: awake and alert   Airway and Oxygen Therapy: Patient Spontanous Breathing  Post-op Pain: mild  Post-op Assessment: Post-op Vital signs reviewed, Patient's Cardiovascular Status Stable, Respiratory Function Stable, Patent Airway and No signs of Nausea or vomiting  Last Vitals:  Filed Vitals:   03/14/12 1635  BP: 158/79  Pulse: 77  Temp:   Resp: 25    Post-op Vital Signs: stable   Complications: No apparent anesthesia complications

## 2012-03-14 NOTE — Preoperative (Signed)
Beta Blockers   Reason not to administer Beta Blockers:Not Applicable 

## 2012-03-14 NOTE — Progress Notes (Signed)
CSW continues to follow for return to [facility].  Bethany C. Corcoran MSW, LCSW [phone]

## 2012-03-14 NOTE — Transfer of Care (Signed)
Immediate Anesthesia Transfer of Care Note  Patient: Katie Galvan  Procedure(s) Performed: Procedure(s) with comments: COLON RESECTION SIGMOID (N/A) - left colectomy with mobilization of splenic flexure COLOSTOMY (N/A)  Patient Location: PACU  Anesthesia Type:General  Level of Consciousness: awake, alert , oriented, patient cooperative and responds to stimulation  Airway & Oxygen Therapy: Patient Spontanous Breathing and Patient connected to face mask oxygen  Post-op Assessment: Report given to PACU RN, Post -op Vital signs reviewed and stable and Patient moving all extremities  Post vital signs: Reviewed and stable  Complications: No apparent anesthesia complications

## 2012-03-15 ENCOUNTER — Encounter (HOSPITAL_COMMUNITY): Payer: Self-pay | Admitting: General Surgery

## 2012-03-15 LAB — CBC
HCT: 33.6 % — ABNORMAL LOW (ref 36.0–46.0)
Hemoglobin: 10.8 g/dL — ABNORMAL LOW (ref 12.0–15.0)
MCH: 28.6 pg (ref 26.0–34.0)
MCV: 88.9 fL (ref 78.0–100.0)
RBC: 3.78 MIL/uL — ABNORMAL LOW (ref 3.87–5.11)

## 2012-03-15 LAB — COMPREHENSIVE METABOLIC PANEL
AST: 24 U/L (ref 0–37)
Albumin: 2.1 g/dL — ABNORMAL LOW (ref 3.5–5.2)
Calcium: 8.9 mg/dL (ref 8.4–10.5)
Chloride: 104 mEq/L (ref 96–112)
Creatinine, Ser: 0.88 mg/dL (ref 0.50–1.10)
GFR calc non Af Amer: 60 mL/min — ABNORMAL LOW (ref >90)
Potassium: 5.3 mEq/L — ABNORMAL HIGH (ref 3.5–5.1)
Sodium: 133 mEq/L — ABNORMAL LOW (ref 135–145)
Total Protein: 6.3 g/dL (ref 6.0–8.3)

## 2012-03-15 LAB — GLUCOSE, CAPILLARY
Glucose-Capillary: 235 mg/dL — ABNORMAL HIGH (ref 70–99)
Glucose-Capillary: 207 mg/dL — ABNORMAL HIGH (ref 70–99)
Glucose-Capillary: 163 mg/dL — ABNORMAL HIGH (ref 70–99)

## 2012-03-15 LAB — BASIC METABOLIC PANEL
CO2: 26 mEq/L (ref 19–32)
Calcium: 9.2 mg/dL (ref 8.4–10.5)
Sodium: 132 mEq/L — ABNORMAL LOW (ref 135–145)

## 2012-03-15 MED ORDER — INSULIN ASPART 100 UNIT/ML ~~LOC~~ SOLN
0.0000 [IU] | SUBCUTANEOUS | Status: DC
  Administered 2012-03-15: 3 [IU] via SUBCUTANEOUS
  Administered 2012-03-15: 5 [IU] via SUBCUTANEOUS
  Administered 2012-03-15 (×3): 3 [IU] via SUBCUTANEOUS
  Administered 2012-03-16: 2 [IU] via SUBCUTANEOUS
  Administered 2012-03-16: 3 [IU] via SUBCUTANEOUS
  Administered 2012-03-16: 2 [IU] via SUBCUTANEOUS
  Administered 2012-03-16 (×2): 3 [IU] via SUBCUTANEOUS
  Administered 2012-03-17 – 2012-03-18 (×6): 2 [IU] via SUBCUTANEOUS

## 2012-03-15 MED ORDER — HYDROMORPHONE HCL PF 1 MG/ML IJ SOLN
1.0000 mg | Freq: Once | INTRAMUSCULAR | Status: AC
  Administered 2012-03-15: 1 mg via INTRAVENOUS
  Filled 2012-03-15: qty 1

## 2012-03-15 MED ORDER — TRACE MINERALS CR-CU-F-FE-I-MN-MO-SE-ZN IV SOLN
INTRAVENOUS | Status: AC
  Administered 2012-03-15: 18:00:00 via INTRAVENOUS
  Filled 2012-03-15: qty 2000

## 2012-03-15 MED ORDER — HYDROMORPHONE HCL PF 2 MG/ML IJ SOLN
2.0000 mg | INTRAMUSCULAR | Status: DC | PRN
  Administered 2012-03-15: 2 mg via INTRAVENOUS
  Filled 2012-03-15: qty 1

## 2012-03-15 MED ORDER — INSULIN ASPART 100 UNIT/ML ~~LOC~~ SOLN
10.0000 [IU] | Freq: Once | SUBCUTANEOUS | Status: AC
  Administered 2012-03-15: 10 [IU] via INTRAVENOUS

## 2012-03-15 MED ORDER — KETOROLAC TROMETHAMINE 30 MG/ML IJ SOLN
30.0000 mg | Freq: Three times a day (TID) | INTRAMUSCULAR | Status: AC
  Administered 2012-03-15 – 2012-03-16 (×3): 30 mg via INTRAVENOUS
  Filled 2012-03-15 (×3): qty 1

## 2012-03-15 MED ORDER — LORAZEPAM 0.5 MG PO TABS
0.5000 mg | ORAL_TABLET | Freq: Once | ORAL | Status: AC
  Administered 2012-03-15: 0.5 mg via ORAL
  Filled 2012-03-15: qty 1

## 2012-03-15 MED ORDER — HYDROMORPHONE HCL PF 1 MG/ML IJ SOLN
0.5000 mg | INTRAMUSCULAR | Status: DC | PRN
  Administered 2012-03-15 – 2012-03-19 (×16): 1 mg via INTRAVENOUS
  Administered 2012-03-20: 0.5 mg via INTRAVENOUS
  Administered 2012-03-20 – 2012-03-26 (×23): 1 mg via INTRAVENOUS
  Filled 2012-03-15 (×41): qty 1

## 2012-03-15 MED ORDER — DEXTROSE 250 MG/ML IV SOLN
25.0000 g | Freq: Once | INTRAVENOUS | Status: DC
  Filled 2012-03-15: qty 100

## 2012-03-15 MED ORDER — FAT EMULSION 20 % IV EMUL
250.0000 mL | INTRAVENOUS | Status: AC
  Administered 2012-03-15: 250 mL via INTRAVENOUS
  Filled 2012-03-15: qty 250

## 2012-03-15 MED ORDER — ALTEPLASE 2 MG IJ SOLR
2.0000 mg | Freq: Once | INTRAMUSCULAR | Status: AC
  Administered 2012-03-15: 2 mg
  Filled 2012-03-15: qty 2

## 2012-03-15 MED ORDER — HYDROMORPHONE HCL PF 1 MG/ML IJ SOLN
0.5000 mg | INTRAMUSCULAR | Status: DC | PRN
  Administered 2012-03-15: 1 mg via INTRAVENOUS
  Filled 2012-03-15: qty 1

## 2012-03-15 MED ORDER — ALTEPLASE 100 MG IV SOLR: 2.0000 mg | Freq: Once | INTRAVENOUS | Status: DC

## 2012-03-15 MED ORDER — DEXTROSE 50 % IV SOLN
25.0000 g | Freq: Once | INTRAVENOUS | Status: AC
  Administered 2012-03-15: 25 g via INTRAVENOUS
  Filled 2012-03-15: qty 50

## 2012-03-15 MED ORDER — ACETAMINOPHEN 10 MG/ML IV SOLN
1000.0000 mg | Freq: Four times a day (QID) | INTRAVENOUS | Status: DC
  Administered 2012-03-15 (×3): 1000 mg via INTRAVENOUS
  Filled 2012-03-15 (×4): qty 100

## 2012-03-15 MED FILL — Povidone-Iodine Oint 10%: CUTANEOUS | Qty: 28.35 | Status: AC

## 2012-03-15 NOTE — Progress Notes (Signed)
PT Cancellation Note  Patient Details Name: Katie Galvan MRN: [id number] DOB: 11/06/1927   Cancelled Treatment:    Reason Eval/Treat Not Completed: Patient not medically ready (new order for PT 3/15) Pt s/p colectomy yesterday and order to start PT tomorrow.   Mareli Antunes,[name] 03/15/2012, 9:30 AM Pager: [phone]

## 2012-03-15 NOTE — Clinical Social Work Note (Signed)
CSW continues to follow for return to [facility]. CSW updated FL2, added pasaar number, updated facility and placed DNR and FL2 forms in chart for signing.   Doreen Salvage, LCSW ICU/Stepdown Clinical Social Worker [facility] Cell [phone] Hours 8am-1200pm M-F

## 2012-03-15 NOTE — Progress Notes (Addendum)
PARENTERAL NUTRITION CONSULT NOTE - FOLLOW UP  Pharmacy Consult for TNA Indication: Intolerance to enteral feeding (Diverticulitis vs colitis of sigmoid colon)  Allergies  Allergen Reactions  . Sulfonamide Derivatives Swelling    Patient Measurements: Height: 5' (152.4 cm) Weight: 139 lb 5.3 oz (63.2 kg) IBW/kg (Calculated) : 45.5 Usual Weight: 65 kg  Vital Signs: Temp: 97.8 F (36.6 C) (03/14 0000) Temp src: Oral (03/14 0000) BP: 151/84 mmHg (03/14 0400) Pulse Rate: 87 (03/14 0400) Intake/Output from previous day: 03/13 0701 - 03/14 0700 In: 3715 [I.V.:2195; Blood:350; IV Piggyback:400; TPN:770] Out: 980 [Urine:655; Blood:325] Intake/Output from this shift:    Labs:  Recent Labs  03/13/12 0430 03/14/12 0600 03/14/12 1625 03/15/12 0340  WBC 11.2* 13.2*  --  18.4*  HGB 7.9* 8.7* 11.8* 10.8*  HCT 25.5* 28.0* 36.1 33.6*  PLT 596* 624*  --  564*     Recent Labs  03/13/12 0430 03/14/12 0600 03/15/12 0340  NA 133* 136 133*  K 4.8 4.4 5.3*  CL 98 103 104  CO2 29 28 22   GLUCOSE 118* 131* 269*  BUN 32* 28* 35*  CREATININE 1.02 0.90 0.88  CALCIUM 9.0 9.4 8.9  MG  --  2.3  --   PHOS  --  2.9  --   PROT 6.0 6.6 6.3  ALBUMIN 2.2* 2.3* 2.1*  AST 19 12 24   ALT 19 13 14   ALKPHOS 95 99 87  BILITOT 0.4 0.3 0.4   Estimated Creatinine Clearance: 40.9 ml/min (by C-G formula based on Cr of 0.88).    Recent Labs  03/14/12 1619 03/14/12 1944 03/14/12 2348  GLUCAP 309* 335* 283*    Nutritional Goals:  - RD recs per day: 1420-1660 Kcal, 80-94 g protein, 2L fluid.  - Clinimix E5/20 at 85ml/hr + IVFE 20% at 104ml/hr on MWF to provide: 84g/day protein, 1684Kcal/day (Avg 1478Kcal/day STTHS, 1958Kcal/day MWF).  Current nutrition:  - Diet: NPO since 3/3 - TNA: Clinimix E 5/20 @ goal rate of 70 ml/hr - mIVF: NS at [facility] x 2 sites  CBGs & Insulin requirements past 24 hours:  - CBGs elevated post op, 283-335 - 16 units SSI  Labs: Electrolytes: K+ now elevated, Na  slightly low, all others WNL Renal Function: Scr wnl Hepatic Function: wnl  Pre-Albumin: 7.9 (3/5), 12.7 (3/10) Triglycerides: 80 (3/5), 79 (3/10)   Assessment:  79 yo M with h/o diveriticulitis presented 2/27 with abd pain, N/V and fatigue. CT abd/pelvis in ED with worsened chronic inflammatory process involving the sigmoid colon. Pt admitted for acute on chronic sigmoiditis, diverticulitis, and UTI. GI on board, pt did not improve with Cipro/Flagyl. Repeat CT 3/3 with chronic sigmoid colon thickening, small area of air either contained within diverticulum or escaped from bowel. Surgery, cardiology and pulmonary consulted and investigating perioperative risk.  TNA ordered to start 3/4 given intolerance to enteral feeding.   - 3/14: Today is TNA Day#11, currently at goal rate of 17ml/hr. POD#1 sigmoid colectomy and colostomy.  Patient appears to be tolerating TNA at goal rate.   Plan:    Continue Clinimix E 5/20 at goal rate of 70 ml/hr.   TNA to contain IV fat emulsion 20% at 10 ml/hr on MWF only due to ongoing shortage  May need to change to electrolyte-free formula if K+ continues to rise.  Increase SSI coverage to moderate scale, continue CBGs q4h   Standard multivitamins and trace elements on MWF only due to ongoing shortage.  Patient not taking anything by mouth  so unable to convert MVI to PO at this time.  TNA labs Monday/Thursdays  Follow up ability to advance diet, wean/dc TNA    Loralee Pacas, PharmD, BCPS Pager: [phone]  03/15/2012 7:20 AM

## 2012-03-15 NOTE — Progress Notes (Signed)
1 Day Post-Op  Subjective: Complaining of allot of pain, nothing has relieved her so far.  Alert and looks fine, very frail elderly 79 y/o.  Objective: Vital signs in last 24 hours: Temp:  [97.6 F (36.4 C)-98.1 F (36.7 C)] 97.8 F (36.6 C) (03/14 0000) Pulse Rate:  [50-94] 84 (03/14 0600) Resp:  [13-34] 13 (03/14 0600) BP: (145-185)/(65-93) 148/76 mmHg (03/14 0600) SpO2:  [94 %-100 %] 99 % (03/14 0600) Weight:  [139 lb 5.3 oz (63.2 kg)] 139 lb 5.3 oz (63.2 kg) (03/13 1714) Last BM Date: 03/13/12  Intake/Output from previous day: 03/13 0701 - 03/14 0700 In: 3895 [I.V.:2235; Blood:350; IV Piggyback:400; TPN:910] Out: 1040 [Urine:715; Blood:325] Intake/Output this shift:    General appearance: alert, cooperative and frail, and complains of pain. Resp: clear to auscultation bilaterally Cardio: she has a loud MR murmr. Telem show SR GI: no bowel sounds, nothing in the ostomy, no nausea, no distension.  Drssing is intact, she has had some bleeding visible thru dressing. Pain is her major complaint. Extremities: She has ulcers both lower legs where she has ran into things. Ther is some skin loss and the one on the left is a bit purulent.  Lab Results:   Recent Labs  03/14/12 0600 03/14/12 1625 03/15/12 0340  WBC 13.2*  --  18.4*  HGB 8.7* 11.8* 10.8*  HCT 28.0* 36.1 33.6*  PLT 624*  --  564*    BMET  Recent Labs  03/14/12 0600 03/15/12 0340  NA 136 133*  K 4.4 5.3*  CL 103 104  CO2 28 22  GLUCOSE 131* 269*  BUN 28* 35*  CREATININE 0.90 0.88  CALCIUM 9.4 8.9   PT/INR No results found for this basename: LABPROT, INR,  in the last 72 hours   Recent Labs Lab 03/11/12 0700 03/12/12 0347 03/13/12 0430 03/14/12 0600 03/15/12 0340  AST 15 31 19 12 24   ALT 13 23 19 13 14   ALKPHOS 70 84 95 99 87  BILITOT 0.4 0.3 0.4 0.3 0.4  PROT 5.8* 5.9* 6.0 6.6 6.3  ALBUMIN 2.1* 2.2* 2.2* 2.3* 2.1*     Lipase     Component Value Date/Time   LIPASE 8* 11/08/2011 1539      Studies/Results: No results found.  Medications: . acetaminophen  1,000 mg Intravenous Q6H  . antiseptic oral rinse  15 mL Mouth Rinse q12n4p  . budesonide-formoterol  1 puff Inhalation BID  . chlorhexidine  15 mL Mouth Rinse BID  . dextrose  25 g Intravenous Once  . fluconazole (DIFLUCAN) IV  200 mg Intravenous Q24H  . insulin aspart  0-15 Units Subcutaneous Q4H  . insulin aspart  10 Units Intravenous Once  . ondansetron (ZOFRAN) IV  8 mg Intravenous Q6H  . piperacillin-tazobactam (ZOSYN)  IV  3.375 g Intravenous Q8H  . sodium chloride  3 mL Intravenous Q12H  . tiotropium  18 mcg Inhalation Daily    Assessment/Plan Diverticulitis; s/p left colectomy with mobilization of splenic flexure, COLOSTOMY, 03/14/2012, Caleen Essex III, MD. This AM: leukocytosis, K+ 5.3, glucose 269 Hx of endocarditis, hx of dysrhythmia,  COPD HX of recurrent falls Hx of recurrent UTI GERD/gastritis/hiatial hernia HYpertension with post op elevated BP Dysphagia Hypercholesterolemia Chronic pain Hx of depression Anemia, on FE at admission. LLE wound PCM on TNA Left nephrolithiasis, non obstructing.   Plan:  I would keep her in the ICU, give her more frequent pain med, and continue tylenol  Her sugars are high, and is on  TNA, I don't see a prior hx of AODM, will defer to medicine.  OOB to chair today, IS, her K+ will come down with the insulin she's getting for her glucose.  Continue NPO. Xeroform to LLE ulcer.  We can keep the other dressing on the right.  LOS: 15 days    JENNINGS,WILLARD 03/15/2012

## 2012-03-15 NOTE — Progress Notes (Signed)
PULMONARY  / CRITICAL CARE MEDICINE  Name: Katie Galvan MRN: [id number] DOB: October 14, 1927    ADMISSION DATE:  02/29/2012 CONSULTATION DATE:  3/5  REFERRING MD :  Surgery  CHIEF COMPLAINT:  Pulmonary clearance for surgery.  BRIEF PATIENT DESCRIPTION:  79 y/o WF never smoker, O2 dependent x 3 years, [name] PCP,  Who needs general surgery for suspected perforated bowel from diverticulitis with bowel resection and /or colostomy. PCCM asked to evaluate for pulmonary clearance.  SIGNIFICANT EVENTS / STUDIES:  2/27 - Admit with suspected perforated bowel from diverticulitis 3/13 - to OR for left colectomy with mobilization of splenic flexure  LINES / TUBES:   CULTURES:   ANTIBIOTICS: Invanz 3/5 >>>3/9 Flagyl 2/27>>>3/5  Zosyn 3/9>>>  SUBJECTIVE:   RN reports no acute events.  Pt reports abd pain  VITAL SIGNS: Temp:  [97.6 F (36.4 C)-98.1 F (36.7 C)] 98.1 F (36.7 C) (03/14 0800) Pulse Rate:  [50-94] 82 (03/14 0800) Resp:  [13-34] 15 (03/14 0800) BP: (136-185)/(65-93) 136/82 mmHg (03/14 0800) SpO2:  [94 %-100 %] 98 % (03/14 0800) Weight:  [139 lb 5.3 oz (63.2 kg)] 139 lb 5.3 oz (63.2 kg) (03/13 1714)  PHYSICAL EXAMINATION: General:  Frail but mentally sharp Neuro:  Intact HEENT:  No LAN Neck:  No JVD Cardiovascular:  HSR RRR, SEM Lungs:  Decreased bs bases Abdomen:  Midline dressing c/d/i, L colostomy, mild tenderness to palpation Musculoskeletal:  Lt wrist deformed from remote surgery Skin:  Warm with mild le edema   Recent Labs Lab 03/13/12 0430 03/14/12 0600 03/15/12 0340  NA 133* 136 133*  K 4.8 4.4 5.3*  CL 98 103 104  CO2 29 28 22   BUN 32* 28* 35*  CREATININE 1.02 0.90 0.88  GLUCOSE 118* 131* 269*    Recent Labs Lab 03/13/12 0430 03/14/12 0600 03/14/12 1625 03/15/12 0340  HGB 7.9* 8.7* 11.8* 10.8*  HCT 25.5* 28.0* 36.1 33.6*  WBC 11.2* 13.2*  --  18.4*  PLT 596* 624*  --  564*   No results found.  ASSESSMENT / PLAN: Pulmonary  clearance for abdominal surgery for presumed perforation. Her pulmonary status does not preclude surgery but does place her at higher risk for post operative pulmonary complications that includes inability to liberate from ventilator.  She appears willing to take this risk but does not want prolonged vent support and tracheostomy. Vent LTAC / trach / PEG is not an option per her request.  Extubated post op and doing well.    Plan: -continue baseline oxygen at 2L  -pulmonary hygiene -mobilize as able -abx per primary SVC -pain control -monitor for delirium given advanced age, and narcotics   PCCM will be available PRN.  Appears ready for transfer out of SDU in next 24 hours- will defer to Primary SVC.   Canary Brim, NP-C [facility] Pgr: [phone] or [phone]   I have interviewed and examined the patient and reviewed the database. I have formulated the assessment and plan as reflected in the note above with amendments made by me.   Billy Fischer, MD;  PCCM service; Mobile [phone]

## 2012-03-15 NOTE — Progress Notes (Addendum)
[facility] PROGRESS NOTE  Katie Galvan OZH:[id number] DOB: Mar 08, 1927 DOA: 02/29/2012 PCP: Michele Mcalpine, MD  Brief narrative: 79 year old female with past medical history significant for diverticulitis, ischemic cardiomyopathy (EF 30%), perimyocarditis, COPD, endocarditis, recurrent falls who presented to [facility] ED 02/29/2012 due to abdominal pain, nausea and vomiting. In ED, evaluation included CT abd/pelvis with findings significant for sigmoid diverticulitis worse compared to previous imaging studies. Surgery and GI are assisting management. Per GI and surgery recommendations, patient has had slow improvement with conservative management involving NPO and antibiotics so even with high level of risk patient may benefit from surgery. CT abdomen repeated 03/11/2012 with findings of chronic bowel wall thickening and inflammation through the proximal sigmoid colon  which may be slightly improved compared to most recent exam. Per surgery, flagyl added to current antibiotic regimen and if no significant improvement in next 24 hours plan is for colectomy and colostomy.  Assessment and Plan:   Principal Problem:  *Acute Sigmoid Diverticulitis  Without improvement with medical therapy Finally underwent L colectomy per Dr.Toth 3/13 Continue TNA and antibiotics, IV Fluconazole/ Zosyn, DC IV flagyl supportive care with anti-emetics, pain control, increase dilaudid dose   Hyperkalemia: - possibly related to blood transfusion: -give Novolog and dextrose X1, repeat bmet in pm -unable to give kayexalate PO or rectally   UTI (lower urinary tract infection)  Secondary to E.Coli  Patient has already completed 5 days of ceftriaxone.  Chronic respiratory failure with hypoxia /COPD stable  Continue symbicort  Albuterol Q 6 hours PRN  Chronic Diastolic CHF : stable 2 D ECHO on this admission with EF 55%  S/p cards eval for clearance  Pancreatic mass  Stable and being followed by GI.    Bilateral lower extremity ulcers  Stable, daily dressing changes  Moderate protein calorie malnutrition  On TNA   DVt proph: hold heparin, resume tomorrow   Code Status: DNR/DNI  Family Communication: no family at bedside  Disposition Plan: may need placement   Consultants:  Dr. Arlyce Dice ( GI)  Dr. Ezzard Standing (Surgery)  Dr. Molli Knock (Pulm)  Dr. Excell Seltzer (Cardiology) Procedures:  none Antibiotics:  Fluconazole 03/10/2012 --> Zosyn 03/10/2012 --> Flagyl 03/12/2012 -->   [name], MD  [facility]  Pager [phone]   If 7PM-7AM, please contact night-coverage [email or web address] Password [email or web address] 03/15/2012, 8:00 AM   LOS: 15 days    HPI/Subjective: Still with severe abd pain, no N/V  Objective: Filed Vitals:   03/15/12 0000 03/15/12 0200 03/15/12 0400 03/15/12 0700  BP: 164/84 145/71 151/84   Pulse: 92 87 87   Temp: 97.8 F (36.6 C)     TempSrc: Oral     Resp: 21 14 13    Height:    5' (1.524 m)  Weight:      SpO2: 98% 98% 99%     Intake/Output Summary (Last 24 hours) at 03/15/12 0800 Last data filed at 03/15/12 0400  Gross per 24 hour  Intake   3715 ml  Output    980 ml  Net   2735 ml    Exam:   General:  Pt is alert, follows commands appropriately, not in acute distress  Cardiovascular: Regular rate and rhythm, S1/S2, no murmurs, no rubs, no gallops  Respiratory: Clear to auscultation bilaterally, no wheezing, no crackles, no rhonchi  Abdomen: Soft, tender in lower abdomen, non distended, bowel sounds present, no guarding  Extremities: No edema, pulses DP and PT palpable bilaterally  Neuro: Grossly nonfocal  Data Reviewed: Basic Metabolic Panel:  Recent  Labs Lab 03/10/12 0505 03/11/12 0700 03/12/12 0347 03/13/12 0430 03/14/12 0600 03/15/12 0340  NA 132* 136 134* 133* 136 133*  K 4.5 4.8 4.8 4.8 4.4 5.3*  CL 97 99 99 98 103 104  CO2 29 30 30 29 28 22   GLUCOSE 138* 113* 129* 118* 131* 269*  BUN 23 26* 31* 32* 28* 35*  CREATININE 0.78 0.90 1.04 1.02  0.90 0.88  CALCIUM 9.0 8.9 8.8 9.0 9.4 8.9  MG 2.2 2.3  --   --  2.3  --   PHOS 3.2 3.6  --   --  2.9  --    Liver Function Tests:  Recent Labs Lab 03/11/12 0700 03/12/12 0347 03/13/12 0430 03/14/12 0600 03/15/12 0340  AST 15 31 19 12 24   ALT 13 23 19 13 14   ALKPHOS 70 84 95 99 87  BILITOT 0.4 0.3 0.4 0.3 0.4  PROT 5.8* 5.9* 6.0 6.6 6.3  ALBUMIN 2.1* 2.2* 2.2* 2.3* 2.1*   No results found for this basename: LIPASE, AMYLASE,  in the last 168 hours No results found for this basename: AMMONIA,  in the last 168 hours CBC:  Recent Labs Lab 03/11/12 0700 03/12/12 0347 03/13/12 0430 03/14/12 0600 03/14/12 1625 03/15/12 0340  WBC 11.1* 13.5* 11.2* 13.2*  --  18.4*  NEUTROABS 7.8*  --   --   --   --   --   HGB 8.2* 8.2* 7.9* 8.7* 11.8* 10.8*  HCT 26.3* 25.9* 25.5* 28.0* 36.1 33.6*  MCV 87.4 87.2 87.9 88.1  --  88.9  PLT 528* 561* 596* 624*  --  564*   Cardiac Enzymes: No results found for this basename: CKTOTAL, CKMB, CKMBINDEX, TROPONINI,  in the last 168 hours BNP: No components found with this basename: POCBNP,  CBG:  Recent Labs Lab 03/14/12 0408 03/14/12 0736 03/14/12 1619 03/14/12 1944 03/14/12 2348  GLUCAP 128* 132* 309* 335* 283*    Recent Results (from the past 240 hour(s))  STOOL CULTURE     Status: None   Collection Time    03/08/12  6:46 AM      Result Value Range Status   Specimen Description STOOL   Final   Special Requests Normal   Final   Culture     Final   Value: NO SALMONELLA, SHIGELLA, CAMPYLOBACTER, YERSINIA, OR E.COLI 0157:H7 ISOLATED     Note: REDUCED NORMAL FLORA PRESENT   Report Status 03/12/2012 FINAL   Final  MRSA PCR SCREENING     Status: None   Collection Time    03/14/12  5:55 PM      Result Value Range Status   MRSA by PCR NEGATIVE  NEGATIVE Final   Comment:            The GeneXpert MRSA Assay (FDA     approved for NASAL specimens     only), is one component of a     comprehensive MRSA colonization     surveillance  program. It is not     intended to diagnose MRSA     infection nor to guide or     monitor treatment for     MRSA infections.     Studies: Ct Abdomen Pelvis W Contrast 03/11/2012  * IMPRESSION:  1. Chronic bowel wall thickening and inflammation through the proximal sigmoid colon  which may be slightly improved compared to most recent exam.  Differential includes chronic diverticulitis, inflammatory bowel disease and cannot exclude neoplasm.  Recommend follow up imaging  or colonoscopy. 2.  Small amount free fluid the pelvis is slightly increased. 3.  Slight improvement in right pleural effusion.      Scheduled Meds: . acetaminophen  1,000 mg Intravenous Q6H  . antiseptic oral rinse  15 mL Mouth Rinse q12n4p  . budesonide-formoterol  1 puff Inhalation BID  . chlorhexidine  15 mL Mouth Rinse BID  . dextrose  25 g Intravenous Once  . fluconazole (DIFLUCAN) IV  200 mg Intravenous Q24H  . heparin  5,000 Units Subcutaneous Q8H  . insulin aspart  0-15 Units Subcutaneous Q4H  . insulin aspart  10 Units Intravenous Once  . metronidazole  500 mg Intravenous Q8H  . ondansetron (ZOFRAN) IV  8 mg Intravenous Q6H  . piperacillin-tazobactam (ZOSYN)  IV  3.375 g Intravenous Q8H  . sodium chloride  3 mL Intravenous Q12H  . tiotropium  18 mcg Inhalation Daily   Continuous Infusions: . sodium chloride 20 mL/hr at 03/14/12 1742  . sodium chloride 20 mL/hr at 03/10/12 2142  . fat emulsion    . lactated ringers    . TPN (CLINIMIX) +/- additives 70 mL/hr at 03/14/12 1722  . TPN (CLINIMIX) +/- additives

## 2012-03-16 LAB — COMPREHENSIVE METABOLIC PANEL
AST: 17 U/L (ref 0–37)
Albumin: 1.9 g/dL — ABNORMAL LOW (ref 3.5–5.2)
Alkaline Phosphatase: 69 U/L (ref 39–117)
BUN: 48 mg/dL — ABNORMAL HIGH (ref 6–23)
Chloride: 102 mEq/L (ref 96–112)
Potassium: 4.8 mEq/L (ref 3.5–5.1)
Sodium: 132 mEq/L — ABNORMAL LOW (ref 135–145)
Total Bilirubin: 0.2 mg/dL — ABNORMAL LOW (ref 0.3–1.2)
Total Protein: 5.4 g/dL — ABNORMAL LOW (ref 6.0–8.3)

## 2012-03-16 LAB — GLUCOSE, CAPILLARY: Glucose-Capillary: 160 mg/dL — ABNORMAL HIGH (ref 70–99)

## 2012-03-16 LAB — CBC
HCT: 26.8 % — ABNORMAL LOW (ref 36.0–46.0)
MCH: 28.9 pg (ref 26.0–34.0)
MCV: 89.9 fL (ref 78.0–100.0)
Platelets: 474 10*3/uL — ABNORMAL HIGH (ref 150–400)
RDW: 17.8 % — ABNORMAL HIGH (ref 11.5–15.5)
WBC: 18.9 10*3/uL — ABNORMAL HIGH (ref 4.0–10.5)

## 2012-03-16 LAB — HEMOGLOBIN A1C: Mean Plasma Glucose: 108 mg/dL (ref <117)

## 2012-03-16 MED ORDER — ENOXAPARIN SODIUM 30 MG/0.3ML ~~LOC~~ SOLN
30.0000 mg | SUBCUTANEOUS | Status: DC
  Administered 2012-03-16 – 2012-03-19 (×4): 30 mg via SUBCUTANEOUS
  Filled 2012-03-16 (×6): qty 0.3

## 2012-03-16 MED ORDER — KETOROLAC TROMETHAMINE 15 MG/ML IJ SOLN
15.0000 mg | Freq: Three times a day (TID) | INTRAMUSCULAR | Status: DC
  Administered 2012-03-16 – 2012-03-17 (×3): 15 mg via INTRAVENOUS
  Filled 2012-03-16 (×5): qty 1

## 2012-03-16 MED ORDER — METOPROLOL TARTRATE 1 MG/ML IV SOLN
5.0000 mg | Freq: Four times a day (QID) | INTRAVENOUS | Status: DC
  Administered 2012-03-16 – 2012-03-18 (×9): 5 mg via INTRAVENOUS
  Filled 2012-03-16 (×13): qty 5

## 2012-03-16 MED ORDER — ACETAMINOPHEN 10 MG/ML IV SOLN
1000.0000 mg | Freq: Four times a day (QID) | INTRAVENOUS | Status: AC
  Administered 2012-03-16 – 2012-03-17 (×4): 1000 mg via INTRAVENOUS
  Filled 2012-03-16 (×5): qty 100

## 2012-03-16 MED ORDER — PANTOPRAZOLE SODIUM 40 MG IV SOLR
40.0000 mg | INTRAVENOUS | Status: DC
  Administered 2012-03-16 – 2012-03-25 (×10): 40 mg via INTRAVENOUS
  Filled 2012-03-16 (×11): qty 40

## 2012-03-16 MED ORDER — CLINIMIX E/DEXTROSE (5/20) 5 % IV SOLN
INTRAVENOUS | Status: AC
  Administered 2012-03-16: 17:00:00 via INTRAVENOUS
  Filled 2012-03-16: qty 2000

## 2012-03-16 NOTE — Progress Notes (Signed)
PARENTERAL NUTRITION CONSULT NOTE - FOLLOW UP  Pharmacy Consult for TNA Indication: Intolerance to enteral feeding (Diverticulitis vs colitis of sigmoid colon)  Allergies  Allergen Reactions  . Sulfonamide Derivatives Swelling    Patient Measurements: Height: 5' (152.4 cm) Weight: 139 lb 5.3 oz (63.2 kg) IBW/kg (Calculated) : 45.5 Usual Weight: 65 kg  Vital Signs: Temp: 97.9 F (36.6 C) (03/15 0400) Temp src: Oral (03/15 0400) BP: 125/60 mmHg (03/15 0400) Pulse Rate: 67 (03/15 0400) Intake/Output from previous day: 03/14 0701 - 03/15 0700 In: 2990 [I.V.:420; IV Piggyback:1100; TPN:1470] Out: 1000 [Urine:1000] Intake/Output from this shift:    Labs:  Recent Labs  03/14/12 0600 03/14/12 1625 03/15/12 0340 03/16/12 0350  WBC 13.2*  --  18.4* 18.9*  HGB 8.7* 11.8* 10.8* 8.5*  HCT 28.0* 36.1 33.6* 26.8*  PLT 624*  --  564* 474*     Recent Labs  03/14/12 0600 03/15/12 0340 03/15/12 1815 03/16/12 0350  NA 136 133* 132* 132*  K 4.4 5.3* 4.9 4.8  CL 103 104 102 102  CO2 28 22 26 26   GLUCOSE 131* 269* 186* 166*  BUN 28* 35* 42* 48*  CREATININE 0.90 0.88 1.01 1.12*  CALCIUM 9.4 8.9 9.2 9.1  MG 2.3  --   --   --   PHOS 2.9  --   --   --   PROT 6.6 6.3  --  5.4*  ALBUMIN 2.3* 2.1*  --  1.9*  AST 12 24  --  17  ALT 13 14  --  13  ALKPHOS 99 87  --  69  BILITOT 0.3 0.4  --  0.2*   Estimated Creatinine Clearance: 32.2 ml/min (by C-G formula based on Cr of 1.12).    Recent Labs  03/15/12 1123 03/15/12 1628 03/15/12 1957  GLUCAP 151* 163* 178*    Nutritional Goals:  - RD recs per day: 1420-1660 Kcal, 80-94 g protein, 2L fluid.  - Clinimix E5/20 at 19ml/hr + IVFE 20% at 75ml/hr on MWF to provide: 84g/day protein, 1684Kcal/day (Avg 1478Kcal/day STTHS, 1958Kcal/day MWF).  Current nutrition:  - Diet: NPO since 3/3 - TNA: Clinimix E 5/20 @ goal rate of 70 ml/hr - mIVF: NS at [facility] x 2 sites  CBGs & Insulin requirements past 24 hours:  - CBGs elevated but  improved, range 151-207 - 23 units SSI + 10 units IV insulin for hyperK+ on 3/14 at ~08:30  Labs: Electrolytes: K+ now WNL after insulin/dextrose administration 3/14, Na slightly low, all others WNL Renal Function: Scr now rising Hepatic Function: wnl  Pre-Albumin: 7.9 (3/5), 12.7 (3/10) Triglycerides: 80 (3/5), 79 (3/10) CBGs: remain above goal of 150   Assessment:  79 yo M with h/o diveriticulitis presented 2/27 with abd pain, N/V and fatigue. CT abd/pelvis in ED with worsened chronic inflammatory process involving the sigmoid colon. Pt admitted for acute on chronic sigmoiditis, diverticulitis, and UTI. GI on board, pt did not improve with Cipro/Flagyl. Repeat CT 3/3 with chronic sigmoid colon thickening, small area of air either contained within diverticulum or escaped from bowel. Surgery, cardiology and pulmonary consulted and investigating perioperative risk.  TNA ordered to start 3/4 given intolerance to enteral feeding.   - 3/15: Today is TNA Day#12, currently at goal rate of 49ml/hr. POD#2 sigmoid colectomy and colostomy.  Patient appears to be tolerating TNA at goal rate.   Plan:    Continue Clinimix E 5/20 at goal rate of 70 ml/hr.   TNA to contain IV fat emulsion  20% at 10 ml/hr on MWF only due to ongoing shortage  Add insulin 8 units/L to TNA (will receive ~13 units/24hr), continue SSI coverage moderate scale q4h   Standard multivitamins and trace elements on MWF only due to ongoing shortage.  Patient not taking anything by mouth so unable to convert MVI to PO at this time.  TNA labs Monday/Thursdays  Follow up ability to advance diet, wean/dc TNA    Loralee Pacas, PharmD, BCPS Pager: [phone]  03/16/2012 7:22 AM

## 2012-03-16 NOTE — Evaluation (Signed)
Physical Therapy Evaluation Patient Details Name: Katie Galvan MRN: [id number] DOB: November 23, 1927 Today's Date: 03/16/2012 Time: 4098-1191 PT Time Calculation (min): 31 min  PT Assessment / Plan / Recommendation Clinical Impression  Pt presents with diverticulitis s/p colon resection with colostomy placement.  Note that pt has been on caseload, but not seen since 3/10 and now with new colostomy, therefore this is re-evaluation.  Tolerated OOB to chair/3in1 well, however requires +2 assist at this time for safety and note that pt gets somewhat anxious with mobility.  Pt will benefit from skilled PT in acute venue to address deficits.  PT recommends returning to SNF for follow up at D/C to maximize pts safety and function.       PT Assessment  Patient needs continued PT services    Follow Up Recommendations  SNF;Supervision/Assistance - 24 hour    Does the patient have the potential to tolerate intense rehabilitation      Barriers to Discharge None      Equipment Recommendations  Rolling walker with 5" wheels    Recommendations for Other Services OT consult   Frequency Min 3X/week    Precautions / Restrictions Precautions Precautions: Fall Precaution Comments: O2 dependent, colostomy  Restrictions Weight Bearing Restrictions: No   Pertinent Vitals/Pain 5/10 pain in abdomen, RN premedicated.       Mobility  Bed Mobility Bed Mobility: Rolling Right;Right Sidelying to Sit Rolling Right: 3: Mod assist;With rail Right Sidelying to Sit: 1: +2 Total assist;With rails Right Sidelying to Sit: Patient Percentage: 50% Details for Bed Mobility Assistance: Assist for LEs/trunk with rolling and for trunk when sitting up.  provided max cues for log rolling technique in order to protect stomach.  Transfers Transfers: Sit to Stand;Stand to Sit;Stand Pivot Transfers Sit to Stand: 1: +2 Total assist;With upper extremity assist;From bed Sit to Stand: Patient Percentage: 60% Stand to Sit:  1: +2 Total assist;With upper extremity assist;With armrests;To chair/3-in-1 Stand to Sit: Patient Percentage: 60% Stand Pivot Transfers: 1: +2 Total assist Stand Pivot Transfers: Patient Percentage: 60% Details for Transfer Assistance: Performed transfers x 2 in order to use 3in1 in room due to noted fecal incontinence during session.  Assist to rise, steady and ensure controlled descent with mod cues for hand placement and technique with RW.  Noted that pt somewhat anxious with mobility and requires cues for continued movement.  Ambulation/Gait Ambulation/Gait Assistance: Not tested (comment) Assistive device: Rolling walker Stairs: No Wheelchair Mobility Wheelchair Mobility: No    Exercises     PT Diagnosis: Difficulty walking;Generalized weakness;Acute pain  PT Problem List: Decreased strength;Decreased range of motion;Decreased activity tolerance;Decreased balance;Decreased mobility;Decreased coordination;Decreased knowledge of use of DME;Decreased safety awareness;Decreased knowledge of precautions;Cardiopulmonary status limiting activity;Pain PT Treatment Interventions: DME instruction;Gait training;Functional mobility training;Therapeutic activities;Therapeutic exercise;Balance training;Patient/family education   PT Goals Acute Rehab PT Goals PT Goal Formulation: With patient Time For Goal Achievement: 03/23/12 Potential to Achieve Goals: Good Pt will go Supine/Side to Sit: with min assist PT Goal: Supine/Side to Sit - Progress: Goal set today Pt will go Sit to Supine/Side: with min assist PT Goal: Sit to Supine/Side - Progress: Goal set today Pt will go Sit to Stand: with supervision PT Goal: Sit to Stand - Progress: Goal set today Pt will go Stand to Sit: with supervision PT Goal: Stand to Sit - Progress: Goal set today Pt will Ambulate: 16 - 50 feet;with min assist;with least restrictive assistive device PT Goal: Ambulate - Progress: Goal set today Pt will Perform Home  Exercise  Program: with supervision, verbal cues required/provided PT Goal: Perform Home Exercise Program - Progress: Goal set today  Visit Information  Last PT Received On: 03/16/12 Assistance Needed: +2    Subjective Data  Subjective: I'm hear you are going to get me out of bed.  Patient Stated Goal: to return to [facility]   Prior Functioning  Home Living Lives With: Alone Available Help at Discharge: Skilled Nursing Facility Type of Home: Apartment Home Access: Level entry Home Layout: One level Bathroom Shower/Tub: [employer] manager Toilet: Handicapped height Home Adaptive Equipment: Shower chair without back;Grab bars in shower;Raised toilet seat with rails;Walker - four wheeled Prior Function Level of Independence: Independent with assistive device(s) Communication Communication: No difficulties    Cognition  Cognition Overall Cognitive Status: Appears within functional limits for tasks assessed/performed Arousal/Alertness: Awake/alert Orientation Level: Appears intact for tasks assessed Behavior During Session: [facility] for tasks performed    Extremity/Trunk Assessment Right Lower Extremity Assessment RLE ROM/Strength/Tone: Deficits RLE ROM/Strength/Tone Deficits: Noted gross weakness, however at least 3+/5 per functional assessment RLE Sensation: WFL - Light Touch Left Lower Extremity Assessment LLE ROM/Strength/Tone: Deficits LLE ROM/Strength/Tone Deficits: Noted gross weakness, however at least 3+/5 per functional assessment.  LLE Sensation: WFL - Light Touch Trunk Assessment Trunk Assessment: Kyphotic   Balance    End of Session PT - End of Session Equipment Utilized During Treatment: Oxygen Activity Tolerance: Patient limited by fatigue;Patient limited by pain Patient left: in chair;with call bell/phone within reach Nurse Communication: Mobility status  GP     Vista Deck 03/16/2012, 12:08 PM

## 2012-03-16 NOTE — Progress Notes (Signed)
2 Days Post-Op  Subjective: No n/v. Pain better today. Hasn't gotten OOB. Up to 750 on IS  Objective: Vital signs in last 24 hours: Temp:  [97.3 F (36.3 C)-97.9 F (36.6 C)] 97.3 F (36.3 C) (03/15 0800) Pulse Rate:  [33-89] 67 (03/15 0400) Resp:  [9-19] 14 (03/15 0400) BP: (111-149)/(56-126) 125/60 mmHg (03/15 0400) SpO2:  [95 %-100 %] 100 % (03/15 0400) Last BM Date:  (smear per rectum on am shift.)  Intake/Output from previous day: 03/14 0701 - 03/15 0700 In: 2990 [I.V.:420; IV Piggyback:1100; TPN:1470] Out: 1000 [Urine:1000] Intake/Output this shift:    Alert, nad cta ant Mild tachy Soft, nd. Expected incisional TTP. Incision c/d/i. Ostomy - viable, edematous. No air in bag No edema  Lab Results:   Recent Labs  03/15/12 0340 03/16/12 0350  WBC 18.4* 18.9*  HGB 10.8* 8.5*  HCT 33.6* 26.8*  PLT 564* 474*   BMET  Recent Labs  03/15/12 1815 03/16/12 0350  NA 132* 132*  K 4.9 4.8  CL 102 102  CO2 26 26  GLUCOSE 186* 166*  BUN 42* 48*  CREATININE 1.01 1.12*  CALCIUM 9.2 9.1   PT/INR No results found for this basename: LABPROT, INR,  in the last 72 hours ABG No results found for this basename: PHART, PCO2, PO2, HCO3,  in the last 72 hours  Studies/Results: No results found.  Anti-infectives: Anti-infectives   Start     Dose/Rate Route Frequency Ordered Stop   03/12/12 1000  metroNIDAZOLE (FLAGYL) IVPB 500 mg  Status:  Discontinued     500 mg 100 mL/hr over 60 Minutes Intravenous 3 times per day 03/12/12 0908 03/15/12 0807   03/10/12 1000  piperacillin-tazobactam (ZOSYN) IVPB 3.375 g  Status:  Discontinued     3.375 g 12.5 mL/hr over 240 Minutes Intravenous Every 8 hours 03/10/12 0851 03/16/12 0823   03/10/12 0815  piperacillin-tazobactam (ZOSYN) IVPB 3.375 g  Status:  Discontinued     3.375 g 12.5 mL/hr over 240 Minutes Intravenous Every 8 hours 03/10/12 0807 03/10/12 0851   03/10/12 0815  fluconazole (DIFLUCAN) IVPB 200 mg  Status:   Discontinued     200 mg 100 mL/hr over 60 Minutes Intravenous Every 24 hours 03/10/12 0807 03/16/12 0823   03/06/12 1200  ertapenem (INVANZ) 1 g in sodium chloride 0.9 % 50 mL IVPB  Status:  Discontinued     1 g 100 mL/hr over 30 Minutes Intravenous Every 24 hours 03/06/12 1059 03/10/12 0807   03/03/12 1200  cefTRIAXone (ROCEPHIN) 1 g in dextrose 5 % 50 mL IVPB  Status:  Discontinued     1 g 100 mL/hr over 30 Minutes Intravenous Every 24 hours 03/03/12 0958 03/06/12 1059   02/29/12 2000  metroNIDAZOLE (FLAGYL) IVPB 500 mg  Status:  Discontinued     500 mg 100 mL/hr over 60 Minutes Intravenous Every 8 hours 02/29/12 1919 03/06/12 1059   02/29/12 2000  ciprofloxacin (CIPRO) IVPB 400 mg  Status:  Discontinued     400 mg 200 mL/hr over 60 Minutes Intravenous Every 12 hours 02/29/12 1925 03/03/12 0958   02/29/12 1745  cefTRIAXone (ROCEPHIN) 1 g in dextrose 5 % 50 mL IVPB     1 g 100 mL/hr over 30 Minutes Intravenous  Once 02/29/12 1744 02/29/12 1859   02/29/12 1745  metroNIDAZOLE (FLAGYL) IVPB 500 mg  Status:  Discontinued     500 mg 100 mL/hr over 60 Minutes Intravenous  Once 02/29/12 1744 02/29/12 1929   02/29/12  1745  ciprofloxacin (CIPRO) IVPB 400 mg  Status:  Discontinued     400 mg 200 mL/hr over 60 Minutes Intravenous  Once 02/29/12 1744 02/29/12 1928      Assessment/Plan: s/p Procedure(s) with comments: COLON RESECTION SIGMOID (N/A) - left colectomy with mobilization of splenic flexure COLOSTOMY (N/A)  Sips of clears OOB, IS Renew Toradol for pain - add GI prophylaxis with protonix Anemia - hgb down slightly today, repeat CBC in am. Monitor Cont VTE prophylaxis Schedule IV lopressor for BP If tolerates sips today, will gradually restart oral home meds PT/OT Continue foley for strict i&o D/c zosyn/flagyl - not sure why still on? No mention of perforation during case in OR  Katie Galvan M. Katie Campanile, MD, FACS General, Bariatric, & Minimally Invasive Surgery [facility]  Surgery, [place]   LOS: 16 days    Katie Galvan 03/16/2012

## 2012-03-16 NOTE — Progress Notes (Addendum)
[facility] PROGRESS NOTE  Katie Galvan JYN:[id number] DOB: [birth year] DOA: 02/29/2012 PCP: Michele Mcalpine, MD  Brief narrative: 79 year old female with past medical history significant for diverticulitis, ischemic cardiomyopathy (EF 30%), perimyocarditis, COPD, endocarditis, recurrent falls who presented to [facility] ED 02/29/2012 due to abdominal pain, nausea and vomiting. In ED, evaluation included CT abd/pelvis with findings significant for sigmoid diverticulitis worse compared to previous imaging studies. Surgery and GI are assisting management. Per GI and surgery recommendations, patient has had slow improvement with conservative management involving NPO and antibiotics so even with high level of risk patient may benefit from surgery. CT abdomen repeated 03/11/2012 with findings of chronic bowel wall thickening and inflammation through the proximal sigmoid colon  which may be slightly improved compared to most recent exam. FInally s/p L colectomy and colostomy.  Assessment and Plan:   Principal Problem:  *Acute Sigmoid Diverticulitis  Without improvement with medical therapy Finally underwent L colectomy per Dr.Toth 3/13 Continue TNA and antibiotics, IV Fluconazole/ Zosyn, DC IV flagyl supportive care with anti-emetics, pain control, re-order IV tylenol   Hyperkalemia: corrected - possibly related to blood transfusion: -gived Novolog and dextrose X1 3/14  Hyperglycemia: continue SSI, check HBAIC   UTI (lower urinary tract infection)  Secondary to E.Coli  Patient has already completed 5 days of ceftriaxone.  Chronic respiratory failure with hypoxia /COPD stable  Continue symbicort  Albuterol Q 6 hours PRN Incentive spirometry Q1H  Chronic Diastolic CHF : stable 2 D ECHO on this admission with EF 55%  S/p cards eval for clearance Restart BB once PO intake resumed  Pancreatic mass  Stable and being followed by GI.   Bilateral lower extremity ulcers  Stable, daily  dressing changes  Moderate protein calorie malnutrition  On TNA   DVt proph: resume lovenox  TX to Tele if ok with CCS  Code Status: DNR/DNI  Family Communication: no family at bedside  Disposition Plan: back to SNF   Consultants:  Dr. Arlyce Dice ( GI)  Dr. Ezzard Standing (Surgery)  Dr. Molli Knock (Pulm)  Dr. Excell Seltzer (Cardiology) Procedures:  none Antibiotics:  Fluconazole 03/10/2012 --> Zosyn 03/10/2012 --> Flagyl 03/12/2012 -->   [name], MD  [facility]  Pager [phone]   If 7PM-7AM, please contact night-coverage [email or web address] Password [email or web address] 03/16/2012, 7:39 AM   LOS: 16 days    HPI/Subjective: Abd pain better today, no N/V, no CP or SoB  Objective: Filed Vitals:   03/15/12 2011 03/15/12 2200 03/16/12 0000 03/16/12 0400  BP: 147/60 149/126 111/56 125/60  Pulse: 70 72 33 67  Temp:   97.6 F (36.4 C) 97.9 F (36.6 C)  TempSrc:   Oral Oral  Resp: 19 14 13 14   Height:      Weight:      SpO2: 100% 99% 100% 100%    Intake/Output Summary (Last 24 hours) at 03/16/12 0739 Last data filed at 03/16/12 0600  Gross per 24 hour  Intake   2990 ml  Output   1000 ml  Net   1990 ml    Exam:   General:  Pt is alert, follows commands appropriately, not in acute distress  Cardiovascular: Regular rate and rhythm, S1/S2, no murmurs, no rubs, no gallops  Respiratory: Clear to auscultation bilaterally, no wheezing, no crackles, no rhonchi  Abdomen: Soft, tender in lower abdomen, non distended, bowel sounds present, no guarding  Extremities: No edema, pulses DP and PT palpable bilaterally  Neuro: Grossly nonfocal  Data Reviewed: Basic Metabolic Panel:  Recent Labs Lab 03/10/12  0505 03/11/12 0700  03/13/12 0430 03/14/12 0600 03/15/12 0340 03/15/12 1815 03/16/12 0350  NA 132* 136  < > 133* 136 133* 132* 132*  K 4.5 4.8  < > 4.8 4.4 5.3* 4.9 4.8  CL 97 99  < > 98 103 104 102 102  CO2 29 30  < > 29 28 22 26 26   GLUCOSE 138* 113*  < > 118* 131* 269* 186* 166*  BUN 23 26*   < > 32* 28* 35* 42* 48*  CREATININE 0.78 0.90  < > 1.02 0.90 0.88 1.01 1.12*  CALCIUM 9.0 8.9  < > 9.0 9.4 8.9 9.2 9.1  MG 2.2 2.3  --   --  2.3  --   --   --   PHOS 3.2 3.6  --   --  2.9  --   --   --   < > = values in this interval not displayed. Liver Function Tests:  Recent Labs Lab 03/12/12 0347 03/13/12 0430 03/14/12 0600 03/15/12 0340 03/16/12 0350  AST 31 19 12 24 17   ALT 23 19 13 14 13   ALKPHOS 84 95 99 87 69  BILITOT 0.3 0.4 0.3 0.4 0.2*  PROT 5.9* 6.0 6.6 6.3 5.4*  ALBUMIN 2.2* 2.2* 2.3* 2.1* 1.9*   No results found for this basename: LIPASE, AMYLASE,  in the last 168 hours No results found for this basename: AMMONIA,  in the last 168 hours CBC:  Recent Labs Lab 03/11/12 0700 03/12/12 0347 03/13/12 0430 03/14/12 0600 03/14/12 1625 03/15/12 0340 03/16/12 0350  WBC 11.1* 13.5* 11.2* 13.2*  --  18.4* 18.9*  NEUTROABS 7.8*  --   --   --   --   --   --   HGB 8.2* 8.2* 7.9* 8.7* 11.8* 10.8* 8.5*  HCT 26.3* 25.9* 25.5* 28.0* 36.1 33.6* 26.8*  MCV 87.4 87.2 87.9 88.1  --  88.9 89.9  PLT 528* 561* 596* 624*  --  564* 474*   Cardiac Enzymes: No results found for this basename: CKTOTAL, CKMB, CKMBINDEX, TROPONINI,  in the last 168 hours BNP: No components found with this basename: POCBNP,  CBG:  Recent Labs Lab 03/15/12 0404 03/15/12 0746 03/15/12 1123 03/15/12 1628 03/15/12 1957  GLUCAP 235* 207* 151* 163* 178*    Recent Results (from the past 240 hour(s))  STOOL CULTURE     Status: None   Collection Time    03/08/12  6:46 AM      Result Value Range Status   Specimen Description STOOL   Final   Special Requests Normal   Final   Culture     Final   Value: NO SALMONELLA, SHIGELLA, CAMPYLOBACTER, YERSINIA, OR E.COLI 0157:H7 ISOLATED     Note: REDUCED NORMAL FLORA PRESENT   Report Status 03/12/2012 FINAL   Final  MRSA PCR SCREENING     Status: None   Collection Time    03/14/12  5:55 PM      Result Value Range Status   MRSA by PCR NEGATIVE   NEGATIVE Final   Comment:            The GeneXpert MRSA Assay (FDA     approved for NASAL specimens     only), is one component of a     comprehensive MRSA colonization     surveillance program. It is not     intended to diagnose MRSA     infection nor to guide or     monitor treatment  for     MRSA infections.     Studies: Ct Abdomen Pelvis W Contrast 03/11/2012  * IMPRESSION:  1. Chronic bowel wall thickening and inflammation through the proximal sigmoid colon  which may be slightly improved compared to most recent exam.  Differential includes chronic diverticulitis, inflammatory bowel disease and cannot exclude neoplasm.  Recommend follow up imaging or colonoscopy. 2.  Small amount free fluid the pelvis is slightly increased. 3.  Slight improvement in right pleural effusion.      Scheduled Meds: . acetaminophen  1,000 mg Intravenous Q6H  . antiseptic oral rinse  15 mL Mouth Rinse q12n4p  . budesonide-formoterol  1 puff Inhalation BID  . chlorhexidine  15 mL Mouth Rinse BID  . fluconazole (DIFLUCAN) IV  200 mg Intravenous Q24H  . insulin aspart  0-15 Units Subcutaneous Q4H  . ondansetron (ZOFRAN) IV  8 mg Intravenous Q6H  . piperacillin-tazobactam (ZOSYN)  IV  3.375 g Intravenous Q8H  . sodium chloride  3 mL Intravenous Q12H  . tiotropium  18 mcg Inhalation Daily   Continuous Infusions: . sodium chloride 20 mL/hr at 03/10/12 2142  . fat emulsion 250 mL (03/16/12 0600)  . lactated ringers    . TPN (CLINIMIX) +/- additives 70 mL/hr at 03/15/12 1809  . TPN (CLINIMIX) +/- additives

## 2012-03-16 NOTE — Progress Notes (Signed)
ANTIBIOTIC CONSULT NOTE - FOLLOW UP  Pharmacy Consult for Zosyn Indication: Diverticulitis  Allergies  Allergen Reactions  . Sulfonamide Derivatives Swelling    Patient Measurements: Height: 5' (152.4 cm) Weight: 139 lb 5.3 oz (63.2 kg) IBW/kg (Calculated) : 45.5  Vital Signs: Temp: 97.9 F (36.6 C) (03/15 0400) Temp src: Oral (03/15 0400) BP: 125/60 mmHg (03/15 0400) Pulse Rate: 67 (03/15 0400) Intake/Output from previous day: 03/14 0701 - 03/15 0700 In: 2990 [I.V.:420; IV Piggyback:1100; TPN:1470] Out: 1000 [Urine:1000] Intake/Output from this shift:    Labs:  Recent Labs  03/14/12 0600 03/14/12 1625 03/15/12 0340 03/15/12 1815 03/16/12 0350  WBC 13.2*  --  18.4*  --  18.9*  HGB 8.7* 11.8* 10.8*  --  8.5*  PLT 624*  --  564*  --  474*  CREATININE 0.90  --  0.88 1.01 1.12*   Estimated Creatinine Clearance: 32.2 ml/min (by C-G formula based on Cr of 1.12).  Microbiology: Recent Results (from the past 720 hour(s))  URINE CULTURE     Status: None   Collection Time    02/29/12  3:51 PM      Result Value Range Status   Specimen Description URINE, CLEAN CATCH   Final   Special Requests NONE   Final   Culture  Setup Time 03/01/2012 01:39   Final   Colony Count >=100,000 COLONIES/ML   Final   Culture ESCHERICHIA COLI   Final   Report Status 03/02/2012 FINAL   Final   Organism ID, Bacteria ESCHERICHIA COLI   Final  CLOSTRIDIUM DIFFICILE BY PCR     Status: None   Collection Time    03/03/12  1:27 AM      Result Value Range Status   C difficile by pcr NEGATIVE  NEGATIVE Final  STOOL CULTURE     Status: None   Collection Time    03/08/12  6:46 AM      Result Value Range Status   Specimen Description STOOL   Final   Special Requests Normal   Final   Culture     Final   Value: NO SALMONELLA, SHIGELLA, CAMPYLOBACTER, YERSINIA, OR E.COLI 0157:H7 ISOLATED     Note: REDUCED NORMAL FLORA PRESENT   Report Status 03/12/2012 FINAL   Final  MRSA PCR SCREENING      Status: None   Collection Time    03/14/12  5:55 PM      Result Value Range Status   MRSA by PCR NEGATIVE  NEGATIVE Final   Comment:            The GeneXpert MRSA Assay (FDA     approved for NASAL specimens     only), is one component of a     comprehensive MRSA colonization     surveillance program. It is not     intended to diagnose MRSA     infection nor to guide or     monitor treatment for     MRSA infections.   Antibiotics: 2/27 >> Flagyl >> 3/2 2/27 >> Cipro >> 3/2 3/2 >> Rocephin >> 3/5 3/5 >> Invanz >> 3/9 3/9 >> Zosyn >> 3/9 >> Diflucan (MD) >> 3/11 >> Flagyl (MD) >> 3/14  Assessment:  79 yo female on Zosyn 3.375g IV q8h (4-hr infusion). Dose remains appropriate for CrCl ~ 18ml/min.  Today is also Day #7 Fluconazole 200mg  IV q24h (started by MD for possible perforation)  Afebrile, WBC remain elevated (18.9k), SCr trending up (0.88 > 1.01 >  1.12)   Goal of Therapy:  Dosing per renal function  Plan:   Continue Zosyn 3.375gm IV q8h (4hr extended infusions)  Continue to monitor renal function and adjust as needed  Follow up planned duration of therapy  Loralee Pacas, PharmD, BCPS Pager: [phone] 03/16/2012,7:32 AM

## 2012-03-17 LAB — CBC
MCHC: 31 g/dL (ref 30.0–36.0)
MCV: 91.8 fL (ref 78.0–100.0)
Platelets: 472 10*3/uL — ABNORMAL HIGH (ref 150–400)
RDW: 18.5 % — ABNORMAL HIGH (ref 11.5–15.5)
WBC: 10.9 10*3/uL — ABNORMAL HIGH (ref 4.0–10.5)

## 2012-03-17 LAB — GLUCOSE, CAPILLARY
Glucose-Capillary: 106 mg/dL — ABNORMAL HIGH (ref 70–99)
Glucose-Capillary: 141 mg/dL — ABNORMAL HIGH (ref 70–99)
Glucose-Capillary: 144 mg/dL — ABNORMAL HIGH (ref 70–99)
Glucose-Capillary: 149 mg/dL — ABNORMAL HIGH (ref 70–99)

## 2012-03-17 LAB — COMPREHENSIVE METABOLIC PANEL
ALT: 13 U/L (ref 0–35)
Alkaline Phosphatase: 75 U/L (ref 39–117)
BUN: 53 mg/dL — ABNORMAL HIGH (ref 6–23)
Chloride: 105 mEq/L (ref 96–112)
GFR calc Af Amer: 48 mL/min — ABNORMAL LOW (ref >90)
Glucose, Bld: 126 mg/dL — ABNORMAL HIGH (ref 70–99)
Potassium: 5 mEq/L (ref 3.5–5.1)
Total Bilirubin: 0.2 mg/dL — ABNORMAL LOW (ref 0.3–1.2)

## 2012-03-17 MED ORDER — CLINIMIX E/DEXTROSE (5/20) 5 % IV SOLN
INTRAVENOUS | Status: AC
  Administered 2012-03-17: 18:00:00 via INTRAVENOUS
  Filled 2012-03-17: qty 2000

## 2012-03-17 MED ORDER — FUROSEMIDE 10 MG/ML IJ SOLN
20.0000 mg | Freq: Two times a day (BID) | INTRAMUSCULAR | Status: DC
  Administered 2012-03-17 – 2012-03-18 (×3): 20 mg via INTRAVENOUS
  Filled 2012-03-17 (×4): qty 2

## 2012-03-17 NOTE — Progress Notes (Signed)
PARENTERAL NUTRITION CONSULT NOTE - FOLLOW UP  Pharmacy Consult for TNA Indication: Intolerance to enteral feeding (Diverticulitis vs colitis of sigmoid colon)  Allergies  Allergen Reactions  . Sulfonamide Derivatives Swelling    Patient Measurements: Height: 5' (152.4 cm) Weight: 150 lb 9.2 oz (68.3 kg) IBW/kg (Calculated) : 45.5 Usual Weight: 65 kg  Vital Signs: Temp: 98.2 F (36.8 C) (03/16 0505) Temp src: Oral (03/16 0505) BP: 146/67 mmHg (03/16 0505) Pulse Rate: 64 (03/16 0505) Intake/Output from previous day: 03/15 0701 - 03/16 0700 In: 2502.5 [I.V.:282; IV Piggyback:600; TPN:1620.5] Out: 930 [Urine:930] Intake/Output from this shift:    Labs:  Recent Labs  03/15/12 0340 03/16/12 0350 03/17/12 0435  WBC 18.4* 18.9* 10.9*  HGB 10.8* 8.5* 8.0*  HCT 33.6* 26.8* 25.8*  PLT 564* 474* 472*     Recent Labs  03/15/12 0340 03/15/12 1815 03/16/12 0350 03/17/12 0435  NA 133* 132* 132* 135  K 5.3* 4.9 4.8 5.0  CL 104 102 102 105  CO2 22 26 26 26   GLUCOSE 269* 186* 166* 126*  BUN 35* 42* 48* 53*  CREATININE 0.88 1.01 1.12* 1.18*  CALCIUM 8.9 9.2 9.1 8.9  PROT 6.3  --  5.4* 5.2*  ALBUMIN 2.1*  --  1.9* 1.9*  AST 24  --  17 14  ALT 14  --  13 13  ALKPHOS 87  --  69 75  BILITOT 0.4  --  0.2* 0.2*   Estimated Creatinine Clearance: 31.7 ml/min (by C-G formula based on Cr of 1.18).    Recent Labs  03/16/12 2056 03/17/12 0017 03/17/12 0449  GLUCAP 143* 144* 149*    Nutritional Goals:  - RD recs per day: 1420-1660 Kcal, 80-94 g protein, 2L fluid.  - Clinimix E5/20 at 25ml/hr + IVFE 20% at 67ml/hr on MWF to provide: 84g/day protein, 1684Kcal/day (Avg 1478Kcal/day STTHS, 1958Kcal/day MWF).  Current nutrition:  - Diet: NPO since 3/3 - TNA: Clinimix E 5/20 @ goal rate of 70 ml/hr - mIVF: NS at [facility] x 2 sites  CBGs & Insulin requirements past 24 hours:  - CBGs improving with addition of insulin to TNA, range 130-156 - 14 units SSI moderate scale (6  units since addition of insulin to TNA)  Labs: Electrolytes: K+ upper end of normal, Na now WNL Renal Function: Scr now rising (0.88 > 1.01 > 1.12 > 1.18), CrCl 30 ml/min Hepatic Function: wnl  Pre-Albumin: 7.9 (3/5), 12.7 (3/10), check tomorrow Triglycerides: 80 (3/5), 79 (3/10), check tomorrow CBGs: improved to ~goal of <150   Assessment:  79 yo M with h/o diveriticulitis presented 2/27 with abd pain, N/V and fatigue. CT abd/pelvis in ED with worsened chronic inflammatory process involving the sigmoid colon. Pt admitted for acute on chronic sigmoiditis, diverticulitis, and UTI. GI on board, pt did not improve with Cipro/Flagyl. Repeat CT 3/3 with chronic sigmoid colon thickening, small area of air either contained within diverticulum or escaped from bowel. Surgery, cardiology and pulmonary consulted and investigating perioperative risk.  TNA ordered to start 3/4 given intolerance to enteral feeding.   - 3/16: Today is TNA Day#13, currently at goal rate of 47ml/hr. POD#3 sigmoid colectomy and colostomy.  Patient appears to be tolerating TNA at goal rate. I/O have been ~+2L, lasix 20mg  IV q12h x 4 doses added today (may assist with lowering K+ as well). Still c/o severe abdominal pain but no N/V.  Plan:    Continue Clinimix E 5/20 at goal rate of 70 ml/hr.   TNA  to contain IV fat emulsion 20% at 10 ml/hr on MWF only due to ongoing shortage  Continue insulin 8 units/L in TNA (will receive ~13 units/24hr), continue SSI coverage moderate scale q4h   Standard multivitamins and trace elements on MWF only due to ongoing shortage.  Patient not taking anything by mouth so unable to convert MVI to PO at this time.  TNA labs Monday/Thursdays  Follow up ability to advance diet, wean/dc TNA    Loralee Pacas, PharmD, BCPS Pager: [phone]  03/17/2012 7:32 AM

## 2012-03-17 NOTE — Progress Notes (Addendum)
[facility] PROGRESS NOTE  Katie Galvan JYN:[id number] DOB: [birth year] DOA: 02/29/2012 PCP: Michele Mcalpine, MD  Brief narrative: 79 year old female with past medical history significant for diverticulitis, ischemic cardiomyopathy (EF 30%), perimyocarditis, COPD, endocarditis, recurrent falls who presented to [facility] ED 02/29/2012 due to abdominal pain, nausea and vomiting. In ED, evaluation included CT abd/pelvis with findings significant for sigmoid diverticulitis worse compared to previous imaging studies. Surgery and GI are assisting management. Per GI and surgery recommendations, patient has had slow improvement with conservative management involving NPO and antibiotics so even with high level of risk patient may benefit from surgery. CT abdomen repeated 03/11/2012 with findings of chronic bowel wall thickening and inflammation through the proximal sigmoid colon  which may be slightly improved compared to most recent exam. FInally s/p L colectomy and colostomy.  Assessment and Plan:   Principal Problem:  *Acute Sigmoid Diverticulitis  Without improvement with medical therapy Finally underwent L colectomy per Dr.Toth 3/13 Continue TNA,  was a prolonged course of IV Fluconazole/ Zosyn stopped 3/15 supportive care with anti-emetics, pain control Stopped Toradol due to hyperkalemia  Hyperkalemia: corrected - possibly related to blood transfusion: - gived Novolog and dextrose X1 3/14  Hyperglycemia:  - continue SSI, check HBAIC   UTI (lower urinary tract infection)  Secondary to E.Coli  Patient has already completed 5 days of ceftriaxone.  Chronic respiratory failure with hypoxia /COPD stable  Continue symbicort  Albuterol Q 6 hours PRN Incentive spirometry Q1H  Chronic Diastolic CHF : volume overloaded yetserday,  2 D ECHO on this admission with EF 55%  S/p cards eval for clearance Restart BB once PO intake resumed, for now continue IV Lasix 20mg  IV yesterday and today, stop  IVF  Pancreatic mass  Stable and being followed by GI.   Bilateral lower extremity ulcers  Stable, daily dressing changes  Moderate protein calorie malnutrition  On TNA   DVt proph: resume lovenox   Code Status: DNR/DNI  Family Communication: no family at bedside  Disposition Plan: back to SNF   Consultants:  Dr. Arlyce Dice ( GI)  Dr. Ezzard Standing (Surgery)  Dr. Molli Knock (Pulm)  Dr. Excell Seltzer (Cardiology) Procedures:  none Antibiotics:  Fluconazole 03/10/2012 -->3/15 Zosyn 03/10/2012 -->3/15 Flagyl 03/12/2012 -->3/14   [name], MD  [facility]  Pager [phone]   If 7PM-7AM, please contact night-coverage [email or web address] Password [email or web address] 03/17/2012, 7:30 AM   LOS: 17 days    HPI/Subjective: Still with constant pretty severe abd pain, no N/V, no CP or SoB  Objective: Filed Vitals:   03/16/12 1600 03/16/12 2045 03/16/12 2123 03/17/12 0505  BP: 96/50 103/77  146/67  Pulse: 115 64  64  Temp: 97.6 F (36.4 C) 98.4 F (36.9 C)  98.2 F (36.8 C)  TempSrc: Oral Oral  Oral  Resp: 16 16  18   Height:      Weight:    68.3 kg (150 lb 9.2 oz)  SpO2: 92% 100% 98% 100%    Intake/Output Summary (Last 24 hours) at 03/17/12 0730 Last data filed at 03/17/12 9629  Gross per 24 hour  Intake 2502.5 ml  Output    930 ml  Net 1572.5 ml    Exam:   General:  Pt is alert, follows commands appropriately, not in acute distress  Cardiovascular: Regular rate and rhythm, S1/S2, no murmurs, no rubs, no gallops  Respiratory: decreased BS at bases, no wheezing, no crackles, no rhonchi  Abdomen: Soft, tender in lower abdomen, colostomy pink, no stool, no bowel sounds present, no  guarding  Extremities: No edema, pulses DP and PT palpable bilaterally  Neuro: Grossly nonfocal  Data Reviewed: Basic Metabolic Panel:  Recent Labs Lab 03/11/12 0700  03/14/12 0600 03/15/12 0340 03/15/12 1815 03/16/12 0350 03/17/12 0435  NA 136  < > 136 133* 132* 132* 135  K 4.8  < > 4.4 5.3* 4.9 4.8 5.0  CL  99  < > 103 104 102 102 105  CO2 30  < > 28 22 26 26 26   GLUCOSE 113*  < > 131* 269* 186* 166* 126*  BUN 26*  < > 28* 35* 42* 48* 53*  CREATININE 0.90  < > 0.90 0.88 1.01 1.12* 1.18*  CALCIUM 8.9  < > 9.4 8.9 9.2 9.1 8.9  MG 2.3  --  2.3  --   --   --   --   PHOS 3.6  --  2.9  --   --   --   --   < > = values in this interval not displayed. Liver Function Tests:  Recent Labs Lab 03/13/12 0430 03/14/12 0600 03/15/12 0340 03/16/12 0350 03/17/12 0435  AST 19 12 24 17 14   ALT 19 13 14 13 13   ALKPHOS 95 99 87 69 75  BILITOT 0.4 0.3 0.4 0.2* 0.2*  PROT 6.0 6.6 6.3 5.4* 5.2*  ALBUMIN 2.2* 2.3* 2.1* 1.9* 1.9*   No results found for this basename: LIPASE, AMYLASE,  in the last 168 hours No results found for this basename: AMMONIA,  in the last 168 hours CBC:  Recent Labs Lab 03/11/12 0700  03/13/12 0430 03/14/12 0600 03/14/12 1625 03/15/12 0340 03/16/12 0350 03/17/12 0435  WBC 11.1*  < > 11.2* 13.2*  --  18.4* 18.9* 10.9*  NEUTROABS 7.8*  --   --   --   --   --   --   --   HGB 8.2*  < > 7.9* 8.7* 11.8* 10.8* 8.5* 8.0*  HCT 26.3*  < > 25.5* 28.0* 36.1 33.6* 26.8* 25.8*  MCV 87.4  < > 87.9 88.1  --  88.9 89.9 91.8  PLT 528*  < > 596* 624*  --  564* 474* 472*  < > = values in this interval not displayed. Cardiac Enzymes: No results found for this basename: CKTOTAL, CKMB, CKMBINDEX, TROPONINI,  in the last 168 hours BNP: No components found with this basename: POCBNP,  CBG:  Recent Labs Lab 03/16/12 1215 03/16/12 1633 03/16/12 2056 03/17/12 0017 03/17/12 0449  GLUCAP 130* 156* 143* 144* 149*    Recent Results (from the past 240 hour(s))  STOOL CULTURE     Status: None   Collection Time    03/08/12  6:46 AM      Result Value Range Status   Specimen Description STOOL   Final   Special Requests Normal   Final   Culture     Final   Value: NO SALMONELLA, SHIGELLA, CAMPYLOBACTER, YERSINIA, OR E.COLI 0157:H7 ISOLATED     Note: REDUCED NORMAL FLORA PRESENT   Report  Status 03/12/2012 FINAL   Final  MRSA PCR SCREENING     Status: None   Collection Time    03/14/12  5:55 PM      Result Value Range Status   MRSA by PCR NEGATIVE  NEGATIVE Final   Comment:            The GeneXpert MRSA Assay (FDA     approved for NASAL specimens     only), is one component  of a     comprehensive MRSA colonization     surveillance program. It is not     intended to diagnose MRSA     infection nor to guide or     monitor treatment for     MRSA infections.     Studies: Ct Abdomen Pelvis W Contrast 03/11/2012  * IMPRESSION:  1. Chronic bowel wall thickening and inflammation through the proximal sigmoid colon  which may be slightly improved compared to most recent exam.  Differential includes chronic diverticulitis, inflammatory bowel disease and cannot exclude neoplasm.  Recommend follow up imaging or colonoscopy. 2.  Small amount free fluid the pelvis is slightly increased. 3.  Slight improvement in right pleural effusion.      Scheduled Meds: . antiseptic oral rinse  15 mL Mouth Rinse q12n4p  . budesonide-formoterol  1 puff Inhalation BID  . chlorhexidine  15 mL Mouth Rinse BID  . enoxaparin (LOVENOX) injection  30 mg Subcutaneous Q24H  . furosemide  20 mg Intravenous BID  . insulin aspart  0-15 Units Subcutaneous Q4H  . metoprolol  5 mg Intravenous Q6H  . ondansetron (ZOFRAN) IV  8 mg Intravenous Q6H  . pantoprazole (PROTONIX) IV  40 mg Intravenous Q24H  . sodium chloride  3 mL Intravenous Q12H  . tiotropium  18 mcg Inhalation Daily   Continuous Infusions: . sodium chloride 20 mL/hr at 03/16/12 2300  . TPN (CLINIMIX) +/- additives 70 mL/hr at 03/16/12 1710

## 2012-03-17 NOTE — Progress Notes (Signed)
General Surgery Note  LOS: 17 days  POD -    3 Days Post-Op  Assessment/Plan: 1.  COLON RESECTION SIGMOID, COLOSTOMY - 03/14/2012 - P. Toth  WBC better  Continue NPO for now.  But can have sips from the floor.  2.  DVT prophylaxis - Lovenox 3.  Hypertension  4. COPD - on home O2 x 2 years.   Elevated right diaphragm  5. Pancreatic mass - uncinate process   Stable - apparent benign process.  6. Bilateral lower extremity ulcers.   Ran into steps getting on the bus.  7. Malnourishment - Prealbumin - 12.7 - 03/11/2012.  On TPN.  For labs tomorrow AM. 8. Anemia - Hgb - 8.0 - 03/17/2012  9. History of CHF   Has murmur.   Has seen Dr. Gala Romney in the past 10.  Will try foley out  Subjective:  Doing okay.  Weak.  No specific complaint.  She's not sure that she wants her foley out. Objective:   Filed Vitals:   03/17/12 0505  BP: 146/67  Pulse: 64  Temp: 98.2 F (36.8 C)  Resp: 18     Intake/Output from previous day:  03/15 0701 - 03/16 0700 In: 2502.5 [I.V.:282; IV Piggyback:600; TPN:1620.5] Out: 930 [Urine:930]  Intake/Output this shift:      Physical Exam:   General: Older WF who is alert and oriented.    HEENT: Normal. Pupils equal. .   Lungs: Mild wheezes.   Abdomen: Soft.  BS present.   Wound: Clean.  Ostomy looks pink.   Lab Results:    Recent Labs  03/16/12 0350 03/17/12 0435  WBC 18.9* 10.9*  HGB 8.5* 8.0*  HCT 26.8* 25.8*  PLT 474* 472*    BMET   Recent Labs  03/16/12 0350 03/17/12 0435  NA 132* 135  K 4.8 5.0  CL 102 105  CO2 26 26  GLUCOSE 166* 126*  BUN 48* 53*  CREATININE 1.12* 1.18*  CALCIUM 9.1 8.9    PT/INR  No results found for this basename: LABPROT, INR,  in the last 72 hours  ABG  No results found for this basename: PHART, PCO2, PO2, HCO3,  in the last 72 hours   Studies/Results:  No results found.   Anti-infectives:   Anti-infectives   Start     Dose/Rate Route Frequency Ordered Stop   03/12/12 1000  metroNIDAZOLE  (FLAGYL) IVPB 500 mg  Status:  Discontinued     500 mg 100 mL/hr over 60 Minutes Intravenous 3 times per day 03/12/12 0908 03/15/12 0807   03/10/12 1000  piperacillin-tazobactam (ZOSYN) IVPB 3.375 g  Status:  Discontinued     3.375 g 12.5 mL/hr over 240 Minutes Intravenous Every 8 hours 03/10/12 0851 03/16/12 0823   03/10/12 0815  piperacillin-tazobactam (ZOSYN) IVPB 3.375 g  Status:  Discontinued     3.375 g 12.5 mL/hr over 240 Minutes Intravenous Every 8 hours 03/10/12 0807 03/10/12 0851   03/10/12 0815  fluconazole (DIFLUCAN) IVPB 200 mg  Status:  Discontinued     200 mg 100 mL/hr over 60 Minutes Intravenous Every 24 hours 03/10/12 0807 03/16/12 0823   03/06/12 1200  ertapenem (INVANZ) 1 g in sodium chloride 0.9 % 50 mL IVPB  Status:  Discontinued     1 g 100 mL/hr over 30 Minutes Intravenous Every 24 hours 03/06/12 1059 03/10/12 0807   03/03/12 1200  cefTRIAXone (ROCEPHIN) 1 g in dextrose 5 % 50 mL IVPB  Status:  Discontinued  1 g 100 mL/hr over 30 Minutes Intravenous Every 24 hours 03/03/12 0958 03/06/12 1059   02/29/12 2000  metroNIDAZOLE (FLAGYL) IVPB 500 mg  Status:  Discontinued     500 mg 100 mL/hr over 60 Minutes Intravenous Every 8 hours 02/29/12 1919 03/06/12 1059   02/29/12 2000  ciprofloxacin (CIPRO) IVPB 400 mg  Status:  Discontinued     400 mg 200 mL/hr over 60 Minutes Intravenous Every 12 hours 02/29/12 1925 03/03/12 0958   02/29/12 1745  cefTRIAXone (ROCEPHIN) 1 g in dextrose 5 % 50 mL IVPB     1 g 100 mL/hr over 30 Minutes Intravenous  Once 02/29/12 1744 02/29/12 1859   02/29/12 1745  metroNIDAZOLE (FLAGYL) IVPB 500 mg  Status:  Discontinued     500 mg 100 mL/hr over 60 Minutes Intravenous  Once 02/29/12 1744 02/29/12 1929   02/29/12 1745  ciprofloxacin (CIPRO) IVPB 400 mg  Status:  Discontinued     400 mg 200 mL/hr over 60 Minutes Intravenous  Once 02/29/12 1744 02/29/12 1928      Ovidio Kin, MD, FACS Pager: [phone],   [facility] Office:  [phone] 03/17/2012

## 2012-03-18 LAB — DIFFERENTIAL
Eosinophils Absolute: 0.5 10*3/uL (ref 0.0–0.7)
Eosinophils Relative: 5 % (ref 0–5)
Lymphocytes Relative: 14 % (ref 12–46)
Lymphs Abs: 1.4 10*3/uL (ref 0.7–4.0)
Monocytes Absolute: 0.9 10*3/uL (ref 0.1–1.0)
Monocytes Relative: 9 % (ref 3–12)

## 2012-03-18 LAB — CBC
HCT: 26.6 % — ABNORMAL LOW (ref 36.0–46.0)
Hemoglobin: 8.4 g/dL — ABNORMAL LOW (ref 12.0–15.0)
MCH: 29 pg (ref 26.0–34.0)
MCHC: 31.6 g/dL (ref 30.0–36.0)
MCV: 91.7 fL (ref 78.0–100.0)
RDW: 18.4 % — ABNORMAL HIGH (ref 11.5–15.5)

## 2012-03-18 LAB — GLUCOSE, CAPILLARY
Glucose-Capillary: 132 mg/dL — ABNORMAL HIGH (ref 70–99)
Glucose-Capillary: 117 mg/dL — ABNORMAL HIGH (ref 70–99)

## 2012-03-18 LAB — COMPREHENSIVE METABOLIC PANEL
ALT: 15 U/L (ref 0–35)
AST: 14 U/L (ref 0–37)
Alkaline Phosphatase: 75 U/L (ref 39–117)
Calcium: 9.3 mg/dL (ref 8.4–10.5)
GFR calc Af Amer: 64 mL/min — ABNORMAL LOW (ref >90)
Glucose, Bld: 137 mg/dL — ABNORMAL HIGH (ref 70–99)
Potassium: 4.7 mEq/L (ref 3.5–5.1)
Sodium: 135 mEq/L (ref 135–145)
Total Protein: 5.4 g/dL — ABNORMAL LOW (ref 6.0–8.3)

## 2012-03-18 LAB — TYPE AND SCREEN
ABO/RH(D): B POS
Antibody Screen: NEGATIVE
Unit division: 0

## 2012-03-18 LAB — PHOSPHORUS: Phosphorus: 2.9 mg/dL (ref 2.3–4.6)

## 2012-03-18 MED ORDER — METOPROLOL TARTRATE 1 MG/ML IV SOLN
10.0000 mg | Freq: Four times a day (QID) | INTRAVENOUS | Status: DC
  Administered 2012-03-18 – 2012-03-22 (×15): 10 mg via INTRAVENOUS
  Administered 2012-03-22: 5 mg via INTRAVENOUS
  Administered 2012-03-23 – 2012-03-26 (×13): 10 mg via INTRAVENOUS
  Filled 2012-03-18 (×3): qty 10
  Filled 2012-03-18: qty 5
  Filled 2012-03-18 (×5): qty 10
  Filled 2012-03-18: qty 5
  Filled 2012-03-18 (×27): qty 10

## 2012-03-18 MED ORDER — FUROSEMIDE 10 MG/ML IJ SOLN
20.0000 mg | Freq: Every day | INTRAMUSCULAR | Status: DC
  Administered 2012-03-19 – 2012-03-25 (×7): 20 mg via INTRAVENOUS
  Filled 2012-03-18 (×8): qty 2

## 2012-03-18 MED ORDER — FAT EMULSION 20 % IV EMUL
250.0000 mL | INTRAVENOUS | Status: AC
  Administered 2012-03-18: 250 mL via INTRAVENOUS
  Filled 2012-03-18: qty 250

## 2012-03-18 MED ORDER — TRACE MINERALS CR-CU-F-FE-I-MN-MO-SE-ZN IV SOLN
INTRAVENOUS | Status: AC
  Administered 2012-03-18: 17:00:00 via INTRAVENOUS
  Filled 2012-03-18: qty 2000

## 2012-03-18 MED ORDER — FUROSEMIDE 10 MG/ML IJ SOLN
20.0000 mg | Freq: Every day | INTRAMUSCULAR | Status: DC
  Filled 2012-03-18: qty 2

## 2012-03-18 NOTE — Progress Notes (Signed)
Physical Therapy Treatment Patient Details Name: Katie Galvan MRN: [id number] DOB: September 10, 1927 Today's Date: 03/18/2012 Time: 0865-7846 PT Time Calculation (min): 27 min  PT Assessment / Plan / Recommendation Comments on Treatment Session  POD #4 exp lap with colostomy.  Assisted OOB with + 2 assist and increaased time.  Amb limited distance on 3 lts nasal with sats in mid 80's.  Instructed on purse lip breathing.  Pt progressing slowly.    Follow Up Recommendations  SNF;Supervision/Assistance - 24 hour     Does the patient have the potential to tolerate intense rehabilitation     Barriers to Discharge        Equipment Recommendations  Rolling walker with 5" wheels    Recommendations for Other Services    Frequency Min 3X/week   Plan Discharge plan remains appropriate;Frequency remains appropriate    Precautions / Restrictions Precautions Precautions: Fall Precaution Comments: O2 dependent, colostomy  Restrictions Weight Bearing Restrictions: No   Pertinent Vitals/Pain C/o 8/10 ABD pain with bed mobility.    Mobility  Bed Mobility Bed Mobility: Supine to Sit;Sit to Supine Supine to Sit: 1: +2 Total assist Supine to Sit: Patient Percentage: 30% Sit to Supine: 1: +2 Total assist Sit to Supine: Patient Percentage: 30% Details for Bed Mobility Assistance: Provid3ed extra assist 2nd POD Exp Lap and painful ABD.   Transfers Transfers: Sit to Stand;Stand to Sit Sit to Stand: 1: +2 Total assist;From bed;From toilet Sit to Stand: Patient Percentage: 60% Stand to Sit: 1: +2 Total assist;To toilet;To bed Stand to Sit: Patient Percentage: 60% Details for Transfer Assistance: 75% VC's on proper tech and hand placement plus trun completion prior to sit.  Pt required increased time.  Assisted from bed to [facility] to void then to amb. Ambulation/Gait Ambulation/Gait Assistance: 1: +2 Total assist Ambulation/Gait: Patient Percentage: 60% Ambulation Distance (Feet): 10 Feet Assistive  device: Rolling walker Ambulation/Gait Assistance Details: 75% VC's on proper walker to self distance and upright posture as well as purse lip breathing as O2 sats decreased to mid 80"s even on 2 lts O2.  Very limited amb distance and ability.  Very unsteady gait. Gait Pattern: Step-to pattern;Trunk flexed Gait velocity: decreased, very slow pace today General Gait Details: SaO2 98% on 3L O2, HR 96    PT Goals                                                             progressing    Visit Information  Last PT Received On: 03/18/12 Assistance Needed: +2    Subjective Data      Cognition       Balance   poor  End of Session PT - End of Session Equipment Utilized During Treatment: Oxygen Activity Tolerance: Patient limited by fatigue;Patient limited by pain Patient left: in bed;with call bell/phone within reach   Felecia Shelling  PTA [facility]  Acute  Rehab Pager      [phone]

## 2012-03-18 NOTE — Progress Notes (Signed)
Patient ID: Katie Galvan, female   DOB: 31-Jul-1927, 79 y.o.   MRN: [id number] 4 Days Post-Op  Subjective: Still with lots of pain, not sleeping well, pain around ostomy site  Objective: Vital signs in last 24 hours: Temp:  [98.2 F (36.8 C)-98.6 F (37 C)] 98.6 F (37 C) (03/17 0627) Pulse Rate:  [66-84] 66 (03/17 0627) Resp:  [16-18] 18 (03/17 0627) BP: (123-138)/(53-68) 123/53 mmHg (03/17 0627) SpO2:  [94 %-100 %] 98 % (03/17 0826) Weight:  [150 lb 5.7 oz (68.2 kg)] 150 lb 5.7 oz (68.2 kg) (03/17 0627) Last BM Date: 03/17/12  Intake/Output from previous day: 03/16 0701 - 03/17 0700 In: 2180 [I.V.:300; IV Piggyback:200; TPN:1680] Out: 1700 [Urine:1700] Intake/Output this shift:    PE: Abd: soft, tender near and around ostomy, +bs, nothing in bag General; up in chair, nad  Lab Results:   Recent Labs  03/17/12 0435 03/18/12 0400  WBC 10.9* 10.1  HGB 8.0* 8.4*  HCT 25.8* 26.6*  PLT 472* 458*   BMET  Recent Labs  03/17/12 0435 03/18/12 0400  NA 135 135  K 5.0 4.7  CL 105 102  CO2 26 29  GLUCOSE 126* 137*  BUN 53* 46*  CREATININE 1.18* 0.94  CALCIUM 8.9 9.3   PT/INR No results found for this basename: LABPROT, INR,  in the last 72 hours CMP     Component Value Date/Time   NA 135 03/18/2012 0400   NA 139 11/27/2011 1533   K 4.7 03/18/2012 0400   K 4.1 11/27/2011 1533   CL 102 03/18/2012 0400   CL 100 11/27/2011 1533   CO2 29 03/18/2012 0400   CO2 31* 11/27/2011 1533   GLUCOSE 137* 03/18/2012 0400   GLUCOSE 97 11/27/2011 1533   BUN 46* 03/18/2012 0400   BUN 22.0 11/27/2011 1533   CREATININE 0.94 03/18/2012 0400   CREATININE 0.9 11/27/2011 1533   CALCIUM 9.3 03/18/2012 0400   CALCIUM 10.0 11/27/2011 1533   PROT 5.4* 03/18/2012 0400   PROT 7.1 11/27/2011 1533   ALBUMIN 2.0* 03/18/2012 0400   ALBUMIN 3.4* 11/27/2011 1533   AST 14 03/18/2012 0400   AST 16 11/27/2011 1533   ALT 15 03/18/2012 0400   ALT 11 11/27/2011 1533   ALKPHOS 75 03/18/2012 0400   ALKPHOS 73 11/27/2011 1533   BILITOT 0.2* 03/18/2012 0400   BILITOT 0.34 11/27/2011 1533   GFRNONAA 55* 03/18/2012 0400   GFRAA 64* 03/18/2012 0400   Lipase     Component Value Date/Time   LIPASE 8* 11/08/2011 1539       Studies/Results: No results found.  Anti-infectives: Anti-infectives   Start     Dose/Rate Route Frequency Ordered Stop   03/12/12 1000  metroNIDAZOLE (FLAGYL) IVPB 500 mg  Status:  Discontinued     500 mg 100 mL/hr over 60 Minutes Intravenous 3 times per day 03/12/12 0908 03/15/12 0807   03/10/12 1000  piperacillin-tazobactam (ZOSYN) IVPB 3.375 g  Status:  Discontinued     3.375 g 12.5 mL/hr over 240 Minutes Intravenous Every 8 hours 03/10/12 0851 03/16/12 0823   03/10/12 0815  piperacillin-tazobactam (ZOSYN) IVPB 3.375 g  Status:  Discontinued     3.375 g 12.5 mL/hr over 240 Minutes Intravenous Every 8 hours 03/10/12 0807 03/10/12 0851   03/10/12 0815  fluconazole (DIFLUCAN) IVPB 200 mg  Status:  Discontinued     200 mg 100 mL/hr over 60 Minutes Intravenous Every 24 hours 03/10/12 0807 03/16/12 0823   03/06/12 1200  ertapenem (INVANZ) 1 g in sodium chloride 0.9 % 50 mL IVPB  Status:  Discontinued     1 g 100 mL/hr over 30 Minutes Intravenous Every 24 hours 03/06/12 1059 03/10/12 0807   03/03/12 1200  cefTRIAXone (ROCEPHIN) 1 g in dextrose 5 % 50 mL IVPB  Status:  Discontinued     1 g 100 mL/hr over 30 Minutes Intravenous Every 24 hours 03/03/12 0958 03/06/12 1059   02/29/12 2000  metroNIDAZOLE (FLAGYL) IVPB 500 mg  Status:  Discontinued     500 mg 100 mL/hr over 60 Minutes Intravenous Every 8 hours 02/29/12 1919 03/06/12 1059   02/29/12 2000  ciprofloxacin (CIPRO) IVPB 400 mg  Status:  Discontinued     400 mg 200 mL/hr over 60 Minutes Intravenous Every 12 hours 02/29/12 1925 03/03/12 0958   02/29/12 1745  cefTRIAXone (ROCEPHIN) 1 g in dextrose 5 % 50 mL IVPB     1 g 100 mL/hr over 30 Minutes Intravenous  Once 02/29/12 1744 02/29/12 1859   02/29/12 1745   metroNIDAZOLE (FLAGYL) IVPB 500 mg  Status:  Discontinued     500 mg 100 mL/hr over 60 Minutes Intravenous  Once 02/29/12 1744 02/29/12 1929   02/29/12 1745  ciprofloxacin (CIPRO) IVPB 400 mg  Status:  Discontinued     400 mg 200 mL/hr over 60 Minutes Intravenous  Once 02/29/12 1744 02/29/12 1928       Assessment/Plan 1. POD#4-left colectomy with mobilization of splenic flexure, colostomy: pain control still an issue, reports most pain need colostomy, no gas or stool in bag yet  --will allow minimal sips of clears only  --? Try toradol for pain control  --oob mobilize   LOS: 18 days    WHITE, ELIZABETH 03/18/2012

## 2012-03-18 NOTE — Progress Notes (Signed)
[facility] PROGRESS NOTE  Katie Galvan ZOX:[id number] DOB: [birth year] DOA: 02/29/2012 PCP: Michele Mcalpine, MD  Brief narrative: 79 year old female with past medical history significant for diverticulitis, ischemic cardiomyopathy (EF 30%), perimyocarditis, COPD, endocarditis, recurrent falls who presented to [facility] ED 02/29/2012 due to abdominal pain, nausea and vomiting. In ED, evaluation included CT abd/pelvis with findings significant for sigmoid diverticulitis worse compared to previous imaging studies. Surgery and GI are assisting management. Per GI and surgery recommendations, patient has had slow improvement with conservative management involving NPO and antibiotics so even with high level of risk patient may benefit from surgery. CT abdomen repeated 03/11/2012 with findings of chronic bowel wall thickening and inflammation through the proximal sigmoid colon  which may be slightly improved compared to most recent exam. FInally s/p L colectomy and colostomy.  Assessment and Plan:   Principal Problem:  *Acute Sigmoid Diverticulitis  Without improvement with medical therapy Finally underwent L colectomy per Dr.Toth 3/13 Now with post op ileus Continue TNA,  was a prolonged course of IV Fluconazole/ Zosyn stopped 3/15 supportive care with anti-emetics, pain control Stopped Toradol due to hyperkalemia Per [facility]  Hyperkalemia: corrected - possibly related to blood transfusion and NSAIDs: - treated with Novolog and dextrose X1 3/14  Hyperglycemia: stable, could be from TNA - continue SSI,  HBAIC is 5.4   UTI (lower urinary tract infection)  Secondary to E.Coli  Patient has already completed 5 days of ceftriaxone.  Chronic respiratory failure with hypoxia /COPD stable  Continue symbicort  Albuterol Q 6 hours PRN Incentive spirometry Q1H  Chronic Diastolic CHF : volume overloaded clinically,  2 D ECHO 3/5 with EF 55%  S/p cards eval for clearance Restart BB once PO intake  resumed, for now continue IV Lasix 20mg  daily, Follow I/Os  Pancreatic mass  Stable and being followed by GI.   Bilateral lower extremity ulcers  Stable, daily dressing changes  Moderate protein calorie malnutrition  On TNA   DVt proph: SQ lovenox   Code Status: DNR/DNI  Family Communication: no family at bedside  Disposition Plan: back to SNF   Consultants:  Dr. Arlyce Dice ( GI)  Dr. Ezzard Standing (Surgery)  Dr. Molli Knock (Pulm)  Dr. Excell Seltzer (Cardiology) Procedures:  none Antibiotics:  Fluconazole 03/10/2012 -->3/15 Zosyn 03/10/2012 -->3/15 Flagyl 03/12/2012 -->3/14   [name], MD  [facility]  Pager [phone]   If 7PM-7AM, please contact night-coverage [email or web address] Password [facility] 03/18/2012, 11:04 AM   LOS: 18 days    HPI/Subjective: Still with constant pretty severe abd pain, no N/V, no CP or SoB, no output in colostomy bag  Objective: Filed Vitals:   03/17/12 2259 03/18/12 0627 03/18/12 0826 03/18/12 0925  BP: 131/61 123/53  128/58  Pulse: 76 66  78  Temp: 98.2 F (36.8 C) 98.6 F (37 C)  98.5 F (36.9 C)  TempSrc: Oral Oral    Resp: 16 18    Height:      Weight:  68.2 kg (150 lb 5.7 oz)    SpO2: 100% 100% 98% 98%    Intake/Output Summary (Last 24 hours) at 03/18/12 1104 Last data filed at 03/18/12 8295  Gross per 24 hour  Intake   2180 ml  Output   1250 ml  Net    930 ml    Exam:   General:  Pt is alert, follows commands appropriately, not in acute distress  Cardiovascular: Regular rate and rhythm, S1/S2, no murmurs, no rubs, no gallops  Respiratory: decreased BS at bases, no wheezing,  no crackles, no rhonchi  Abdomen: Soft, tender in lower abdomen, colostomy pink, no stool, no bowel sounds present, no guarding  Extremities: No edema, pulses DP and PT palpable bilaterally  Neuro: Grossly nonfocal  Data Reviewed: Basic Metabolic Panel:  Recent Labs Lab 03/14/12 0600 03/15/12 0340 03/15/12 1815 03/16/12 0350 03/17/12 0435 03/18/12 0400   NA 136 133* 132* 132* 135 135  K 4.4 5.3* 4.9 4.8 5.0 4.7  CL 103 104 102 102 105 102  CO2 28 22 26 26 26 29   GLUCOSE 131* 269* 186* 166* 126* 137*  BUN 28* 35* 42* 48* 53* 46*  CREATININE 0.90 0.88 1.01 1.12* 1.18* 0.94  CALCIUM 9.4 8.9 9.2 9.1 8.9 9.3  MG 2.3  --   --   --   --  2.0  PHOS 2.9  --   --   --   --  2.9   Liver Function Tests:  Recent Labs Lab 03/14/12 0600 03/15/12 0340 03/16/12 0350 03/17/12 0435 03/18/12 0400  AST 12 24 17 14 14   ALT 13 14 13 13 15   ALKPHOS 99 87 69 75 75  BILITOT 0.3 0.4 0.2* 0.2* 0.2*  PROT 6.6 6.3 5.4* 5.2* 5.4*  ALBUMIN 2.3* 2.1* 1.9* 1.9* 2.0*   No results found for this basename: LIPASE, AMYLASE,  in the last 168 hours No results found for this basename: AMMONIA,  in the last 168 hours CBC:  Recent Labs Lab 03/14/12 0600 03/14/12 1625 03/15/12 0340 03/16/12 0350 03/17/12 0435 03/18/12 0400  WBC 13.2*  --  18.4* 18.9* 10.9* 10.1  NEUTROABS  --   --   --   --   --  7.3  HGB 8.7* 11.8* 10.8* 8.5* 8.0* 8.4*  HCT 28.0* 36.1 33.6* 26.8* 25.8* 26.6*  MCV 88.1  --  88.9 89.9 91.8 91.7  PLT 624*  --  564* 474* 472* 458*   Cardiac Enzymes: No results found for this basename: CKTOTAL, CKMB, CKMBINDEX, TROPONINI,  in the last 168 hours BNP: No components found with this basename: POCBNP,  CBG:  Recent Labs Lab 03/17/12 1622 03/17/12 1952 03/17/12 2314 03/18/12 0420 03/18/12 0740  GLUCAP 106* 149* 103* 132* 115*    Recent Results (from the past 240 hour(s))  MRSA PCR SCREENING     Status: None   Collection Time    03/14/12  5:55 PM      Result Value Range Status   MRSA by PCR NEGATIVE  NEGATIVE Final   Comment:            The GeneXpert MRSA Assay (FDA     approved for NASAL specimens     only), is one component of a     comprehensive MRSA colonization     surveillance program. It is not     intended to diagnose MRSA     infection nor to guide or     monitor treatment for     MRSA infections.     Studies: Ct  Abdomen Pelvis W Contrast 03/11/2012  * IMPRESSION:  1. Chronic bowel wall thickening and inflammation through the proximal sigmoid colon  which may be slightly improved compared to most recent exam.  Differential includes chronic diverticulitis, inflammatory bowel disease and cannot exclude neoplasm.  Recommend follow up imaging or colonoscopy. 2.  Small amount free fluid the pelvis is slightly increased. 3.  Slight improvement in right pleural effusion.      Scheduled Meds: . antiseptic oral rinse  15 mL Mouth  Rinse q12n4p  . budesonide-formoterol  1 puff Inhalation BID  . chlorhexidine  15 mL Mouth Rinse BID  . enoxaparin (LOVENOX) injection  30 mg Subcutaneous Q24H  . [START ON 03/19/2012] furosemide  20 mg Intravenous Daily  . insulin aspart  0-15 Units Subcutaneous Q4H  . metoprolol  5 mg Intravenous Q6H  . ondansetron (ZOFRAN) IV  8 mg Intravenous Q6H  . pantoprazole (PROTONIX) IV  40 mg Intravenous Q24H  . sodium chloride  3 mL Intravenous Q12H  . tiotropium  18 mcg Inhalation Daily   Continuous Infusions: . sodium chloride 20 mL/hr (03/17/12 0810)  . TPN (CLINIMIX) +/- additives 70 mL/hr at 03/17/12 1742

## 2012-03-18 NOTE — Progress Notes (Signed)
Pt had 5 beats of V-tach. Pt asymptomatic. VSS. MD notified. No new orders received. Will continue to monitor.

## 2012-03-18 NOTE — Progress Notes (Signed)
General Surgery [facility]  Patient seen and examined.  Son at bedside.  Needs to mobilize.  BS present on exam.  Wound looks fine.  Will follow closely.  Agree with sips of clear liquids today.  Velora Heckler, MD, [facility] Office: [phone]

## 2012-03-18 NOTE — Progress Notes (Signed)
Pt had a 23 beat run of SVT. Pt asymptomatic. VSS. MD notified. New orders placed in epic. Will continue to monitor.

## 2012-03-18 NOTE — Progress Notes (Signed)
PARENTERAL NUTRITION CONSULT NOTE - FOLLOW UP  Pharmacy Consult for TNA Indication: Intolerance to enteral feeding s/p sigmoid colon resection 3/13 for diverticulitis  Allergies  Allergen Reactions  . Sulfonamide Derivatives Swelling    Patient Measurements: Height: 5' (152.4 cm) Weight: 150 lb 5.7 oz (68.2 kg) IBW/kg (Calculated) : 45.5 Usual Weight: 65 kg  Vital Signs: Temp: 98.6 F (37 C) (03/17 0627) Temp src: Oral (03/17 0627) BP: 123/53 mmHg (03/17 0627) Pulse Rate: 66 (03/17 0627) Intake/Output from previous day: 03/16 0701 - 03/17 0700 In: 1290 [I.V.:300; IV Piggyback:150; TPN:840] Out: 1575 [Urine:1575] Intake/Output from this shift:    Labs:  Recent Labs  03/16/12 0350 03/17/12 0435 03/18/12 0400  WBC 18.9* 10.9* 10.1  HGB 8.5* 8.0* 8.4*  HCT 26.8* 25.8* 26.6*  PLT 474* 472* 458*     Recent Labs  03/16/12 0350 03/17/12 0435 03/18/12 0400  NA 132* 135 135  K 4.8 5.0 4.7  CL 102 105 102  CO2 26 26 29   GLUCOSE 166* 126* 137*  BUN 48* 53* 46*  CREATININE 1.12* 1.18* 0.94  CALCIUM 9.1 8.9 9.3  MG  --   --  2.0  PHOS  --   --  2.9  PROT 5.4* 5.2* 5.4*  ALBUMIN 1.9* 1.9* 2.0*  AST 17 14 14   ALT 13 13 15   ALKPHOS 69 75 75  BILITOT 0.2* 0.2* 0.2*   Estimated Creatinine Clearance: 39.8 ml/min (by C-G formula based on Cr of 0.94).    Recent Labs  03/17/12 1952 03/17/12 2314 03/18/12 0420  GLUCAP 149* 103* 132*    Nutritional Goals:  - RD recs per day: 1420-1660 Kcal, 80-94 g protein, 2L fluid - Clinimix E5/15 at ml/h + lipids 11ml/hr MWF will provide 90gm protein and weekly avg of 1484 kcals (1278kcal TTSS and 1758 kcals MWF).  - Clinimix E5/20 at 52ml/hr + IVFE 20% at 58ml/hr on MWF to provide: 84g/day protein, 1684Kcal/day (Avg 1478Kcal/day STTHS, 1958Kcal/day MWF).  Current nutrition:  - Diet: NPO since 3/3 - TNA: Clinimix E 5/20 @ goal rate of 70 ml/hr - mIVF: NS at [facility] x 2 sites  CBGs & Insulin requirements past 24 hours:  -  CBGs improving with addition of insulin to TNA, within goal of 150mg /dl - 6 units SSI moderate scale  Labs: Electrolytes: K+ = 4.7, Corr Ca = 10.9 (slightly elevated), Phos = 2.9, Na now WNL Renal Function: Scr = 0.94 today (0.88 > 1.01 > 1.12 > 1.18), CrCl 40 ml/min Hepatic Function: wnl  Pre-Albumin: 7.9 (3/5), 12.7 (3/10), pending (3/17) Triglycerides: 80 (3/5), 79 (3/10), 171 (3/17) CBGs: improved to ~goal of <150   Assessment:  7 yoF with h/o diveriticulitis presented 2/27 with abd pain, N/V and fatigue. CT abd/pelvis in ED with worsened chronic inflammatory process involving the sigmoid colon. Pt admitted for acute on chronic sigmoiditis, diverticulitis, and UTI. GI on board, pt did not improve with Cipro/Flagyl. Repeat CT 3/3 with chronic sigmoid colon thickening, small area of air either contained within diverticulum or escaped from bowel.  TNA ordered to start 3/4 given intolerance to enteral feeding. S/p sigmoid colectomy 3/13.   - 3/17: Today is TNA Day#14, currently at goal rate of 41ml/hr. POD#4 sigmoid colectomy and colostomy.  Patient appears to be tolerating TNA at goal rate. I/O = neg fluid balance with lasix 20mg  IV q12h x 4 doses added 3/16. Current glucose infusion rate = 4.5mg /kg/min (at upper end desired range)  Plan:    Due to elevated  glucose infusion rate and to prevent unwanted sequelae of this, change to Clinimix E 5/15 at goal rate of 70 ml/hr.   K better today. Watch K and remove electrolytes if K increases  TNA to contain IV fat emulsion 20% at 10 ml/hr on MWF only due to ongoing shortage  Change insulin 6 units/L in TNA (will receive ~11 units/24hr), continue SSI coverage moderate scale q4h   Standard multivitamins and trace elements on MWF only due to ongoing shortage.  Patient not taking anything by mouth so unable to convert MVI to PO at this time.  TNA labs Monday/Thursdays  Juliette Alcide, PharmD, BCPS.   Pager: [phone] 03/18/2012 7:12 AM

## 2012-03-18 NOTE — Progress Notes (Signed)
Patient transferred back to [facility]. CSW to continue to follow for snf at [facility] upon medical clearance.  Rhian Funari C. Zyla Dascenzo MSW, LCSW [phone]

## 2012-03-19 LAB — GLUCOSE, CAPILLARY
Glucose-Capillary: 117 mg/dL — ABNORMAL HIGH (ref 70–99)
Glucose-Capillary: 131 mg/dL — ABNORMAL HIGH (ref 70–99)
Glucose-Capillary: 147 mg/dL — ABNORMAL HIGH (ref 70–99)

## 2012-03-19 LAB — COMPREHENSIVE METABOLIC PANEL
BUN: 35 mg/dL — ABNORMAL HIGH (ref 6–23)
CO2: 31 mEq/L (ref 19–32)
Chloride: 102 mEq/L (ref 96–112)
Creatinine, Ser: 0.78 mg/dL (ref 0.50–1.10)
GFR calc non Af Amer: 76 mL/min — ABNORMAL LOW (ref >90)
Glucose, Bld: 119 mg/dL — ABNORMAL HIGH (ref 70–99)
Total Bilirubin: 0.3 mg/dL (ref 0.3–1.2)

## 2012-03-19 LAB — CBC
Platelets: 520 10*3/uL — ABNORMAL HIGH (ref 150–400)
RBC: 3.28 MIL/uL — ABNORMAL LOW (ref 3.87–5.11)
WBC: 12.3 10*3/uL — ABNORMAL HIGH (ref 4.0–10.5)

## 2012-03-19 MED ORDER — ACETAMINOPHEN 10 MG/ML IV SOLN
1000.0000 mg | Freq: Four times a day (QID) | INTRAVENOUS | Status: AC
  Administered 2012-03-19 – 2012-03-20 (×4): 1000 mg via INTRAVENOUS
  Filled 2012-03-19 (×4): qty 100

## 2012-03-19 MED ORDER — CLINIMIX E/DEXTROSE (5/15) 5 % IV SOLN
INTRAVENOUS | Status: AC
  Administered 2012-03-19: 17:00:00 via INTRAVENOUS
  Filled 2012-03-19: qty 2000

## 2012-03-19 MED ORDER — ENOXAPARIN SODIUM 40 MG/0.4ML ~~LOC~~ SOLN
40.0000 mg | SUBCUTANEOUS | Status: DC
  Administered 2012-03-20 – 2012-03-26 (×7): 40 mg via SUBCUTANEOUS
  Filled 2012-03-19 (×10): qty 0.4

## 2012-03-19 MED ORDER — INSULIN ASPART 100 UNIT/ML ~~LOC~~ SOLN
0.0000 [IU] | Freq: Four times a day (QID) | SUBCUTANEOUS | Status: DC
  Administered 2012-03-19 – 2012-03-21 (×8): 2 [IU] via SUBCUTANEOUS
  Administered 2012-03-22: 3 [IU] via SUBCUTANEOUS
  Administered 2012-03-22: 2 [IU] via SUBCUTANEOUS
  Administered 2012-03-22: 07:00:00 via SUBCUTANEOUS
  Administered 2012-03-22 – 2012-03-23 (×3): 2 [IU] via SUBCUTANEOUS
  Administered 2012-03-23: 3 [IU] via SUBCUTANEOUS
  Administered 2012-03-24 (×3): 2 [IU] via SUBCUTANEOUS
  Administered 2012-03-24: 1 [IU] via SUBCUTANEOUS
  Administered 2012-03-25: 3 [IU] via SUBCUTANEOUS
  Administered 2012-03-25 – 2012-03-26 (×3): 2 [IU] via SUBCUTANEOUS

## 2012-03-19 NOTE — Progress Notes (Signed)
5 Days Post-Op  Subjective: Still complaining of pain in abdomen.  Despite that she moves well and she has chronic pain which hurts her everywhere.  Nothing thru ostomy at this point.  Objective: Vital signs in last 24 hours: Temp:  [98.1 F (36.7 C)-98.8 F (37.1 C)] 98.8 F (37.1 C) (03/18 0637) Pulse Rate:  [74-84] 84 (03/18 0637) Resp:  [20] 20 (03/18 0637) BP: (125-159)/(60-77) 155/77 mmHg (03/18 0637) SpO2:  [95 %-100 %] 100 % (03/18 0637) Weight:  [139 lb 15.9 oz (63.5 kg)] 139 lb 15.9 oz (63.5 kg) (03/18 0637) Last BM Date: 03/17/12 TNA/NPO nothing PO, no BM recorded, afebrile, VSS, WBC up, other labs stable  Intake/Output from previous day: 03/17 0701 - 03/18 0700 In: 2458 [P.O.:50; I.V.:458.7; IV Piggyback:150; TPN:1799.3] Out: 2951 [Urine:2951] Intake/Output this shift: Total I/O In: -  Out: 325 [Urine:325]  General appearance: alert, cooperative, no distress and tired and chronically dyspenic. Resp: clear to auscultation bilaterally and few rales in the bases GI: soft, tender, no BS, nothing in ostomy.  She moves really well in bed. and says she walked some yesterday.  Lab Results:   Recent Labs  03/18/12 0400 03/19/12 0530  WBC 10.1 12.3*  HGB 8.4* 9.6*  HCT 26.6* 29.9*  PLT 458* 520*    BMET  Recent Labs  03/18/12 0400 03/19/12 0530  NA 135 137  K 4.7 4.5  CL 102 102  CO2 29 31  GLUCOSE 137* 119*  BUN 46* 35*  CREATININE 0.94 0.78  CALCIUM 9.3 9.5   PT/INR No results found for this basename: LABPROT, INR,  in the last 72 hours   Recent Labs Lab 03/15/12 0340 03/16/12 0350 03/17/12 0435 03/18/12 0400 03/19/12 0530  AST 24 17 14 14 14   ALT 14 13 13 15 14   ALKPHOS 87 69 75 75 93  BILITOT 0.4 0.2* 0.2* 0.2* 0.3  PROT 6.3 5.4* 5.2* 5.4* 5.9*  ALBUMIN 2.1* 1.9* 1.9* 2.0* 2.2*     Lipase     Component Value Date/Time   LIPASE 8* 11/08/2011 1539     Studies/Results: No results found.  Medications: . antiseptic oral rinse  15  mL Mouth Rinse q12n4p  . budesonide-formoterol  1 puff Inhalation BID  . chlorhexidine  15 mL Mouth Rinse BID  . enoxaparin (LOVENOX) injection  30 mg Subcutaneous Q24H  . furosemide  20 mg Intravenous Daily  . insulin aspart  0-15 Units Subcutaneous Q6H  . metoprolol  10 mg Intravenous Q6H  . ondansetron (ZOFRAN) IV  8 mg Intravenous Q6H  . pantoprazole (PROTONIX) IV  40 mg Intravenous Q24H  . sodium chloride  3 mL Intravenous Q12H  . tiotropium  18 mcg Inhalation Daily    Assessment/Plan POD#5-left colectomy with mobilization of splenic flexure, colostomy for Sigmoid diverticulitis. Post op ileus Hx of endocarditis, hx of dysrhythmia,  COPD  HX of recurrent falls  Hx of recurrent UTI  GERD/gastritis/hiatial hernia  HYpertension with post op elevated BP  Dysphagia  Hypercholesterolemia  Chronic pain  Hx of depression  Anemia, on FE at admission.  LLE wound PCM on TNA  Left nephrolithiasis, non obstructing.   Plan;  She only had 5 mg of dilaudid,  Mobilize more , IV tylenol and keep going. Continue TNA, appreciate Medicine's help with her multiple medical issues.  LOS: 19 days    Geo Slone 03/19/2012

## 2012-03-19 NOTE — Progress Notes (Signed)
General Surgery [facility]  Patient seen and examined.  Needs to mobilize.  PT here to ambulate today.  Await resolution of post op ileus.  Velora Heckler, MD, [facility] Office: [phone]

## 2012-03-19 NOTE — Progress Notes (Signed)
NUTRITION FOLLOW UP  Intervention:   TPN per PharmD: Clinimix E5/15 75 at ml/h + lipids 36ml/hr MWF will provide 90gm protein and weekly avg of 1484 kcals (1278kcal TTSS and 1758 kcals MWF).   Current TPN is providing 100% of estimated calorie needs and 102% of estimated protein needs  Encourage sips of clears as tolerated  Nutrition Dx:   Inadequate oral intake related to inability to eat as evidenced by NPO status; ongoing  Goal:   Pt to meet >/= 90% of their estimated nutrition needs; being met  Ileus resolution  Monitor:   Ileus PO intake Wt; 1 lb wt gain since 3/11 Labs  Assessment:   79 y.o. year-old female with history of likely ischemic LVEF 30%, restrictive lung disease due to elevated diaphragm, COPD, GERD, endocarditis, recurrent falls, recurrent UTIs, and hx of diverticulitis who presents with abdominal pain, N/V and fatigue.  3/11: Per nursing rounds this morning pt has chosen to have surgery if possible. Pt states that she doesn't feel well but, she still feels hungry. TPN is at goal rate. Will follow-up with pt after potential surgery. 3/18 Pt had colon resection with colostomy on 3/13, now with post-op ileus. Pt has TPN running at goal rate of 75 ml/hr which is meeting calorie and protein needs. Pt had a 1 lb wt gain in past week. Per RN pt is giving up; pt is allowed to have sips of clear liquids as of 3/17 but has been refusing per RN. Pt had 50-60 ml po yesterday and some ice chips today. Pt was asleep at time of visit and fell back asleep after being awakened. Will re-attempt to speak with pt tomorrow. Pt in need of new colostomy education prior to discharge.  Height: Ht Readings from Last 1 Encounters:  03/15/12 5' (1.524 m)    Weight Status:   Wt Readings from Last 1 Encounters:  03/19/12 139 lb 15.9 oz (63.5 kg)    Re-estimated needs:  Kcal: 1610-9604  Protein: 75-88 grams  Fluid: 2 L  Skin: Generalized edema; ecchymosis on arm and hand; abdominal  incision  Diet Order: NPO   Intake/Output Summary (Last 24 hours) at 03/19/12 1621 Last data filed at 03/19/12 1433  Gross per 24 hour  Intake   1638 ml  Output   2126 ml  Net   -488 ml    Last BM: 3/16 per nursing notes; no ostomy output since surgery per RN today   Labs:   Recent Labs Lab 03/14/12 0600  03/17/12 0435 03/18/12 0400 03/19/12 0530  NA 136  < > 135 135 137  K 4.4  < > 5.0 4.7 4.5  CL 103  < > 105 102 102  CO2 28  < > 26 29 31   BUN 28*  < > 53* 46* 35*  CREATININE 0.90  < > 1.18* 0.94 0.78  CALCIUM 9.4  < > 8.9 9.3 9.5  MG 2.3  --   --  2.0  --   PHOS 2.9  --   --  2.9  --   GLUCOSE 131*  < > 126* 137* 119*  < > = values in this interval not displayed.  CBG (last 3)   Recent Labs  03/19/12 0750 03/19/12 1213 03/19/12 1536  GLUCAP 116* 131* 157*    Scheduled Meds: . acetaminophen  1,000 mg Intravenous Q6H  . antiseptic oral rinse  15 mL Mouth Rinse q12n4p  . budesonide-formoterol  1 puff Inhalation BID  . chlorhexidine  15 mL Mouth Rinse BID  . enoxaparin (LOVENOX) injection  30 mg Subcutaneous Q24H  . furosemide  20 mg Intravenous Daily  . insulin aspart  0-15 Units Subcutaneous Q6H  . metoprolol  10 mg Intravenous Q6H  . ondansetron (ZOFRAN) IV  8 mg Intravenous Q6H  . pantoprazole (PROTONIX) IV  40 mg Intravenous Q24H  . sodium chloride  3 mL Intravenous Q12H  . tiotropium  18 mcg Inhalation Daily    Continuous Infusions: . sodium chloride 20 mL/hr (03/17/12 0810)  . TPN (CLINIMIX) +/- additives 75 mL/hr at 03/18/12 1723   And  . fat emulsion 250 mL (03/18/12 1724)  . TPN [facility]) +/- additives      Ian Malkin RD, LDN Inpatient Clinical Dietitian Pager: [phone] After Hours Pager: [phone]

## 2012-03-19 NOTE — Progress Notes (Addendum)
PARENTERAL NUTRITION CONSULT NOTE - FOLLOW UP  Pharmacy Consult for TNA Indication: Intolerance to enteral feeding s/p sigmoid colon resection 3/13 for diverticulitis  Allergies  Allergen Reactions  . Sulfonamide Derivatives Swelling    Patient Measurements: Height: 5' (152.4 cm) Weight: 139 lb 15.9 oz (63.5 kg) IBW/kg (Calculated) : 45.5 Usual Weight: 65 kg  Vital Signs: Temp: 98.8 F (37.1 C) (03/18 0637) Temp src: Oral (03/18 0637) BP: 155/77 mmHg (03/18 0637) Pulse Rate: 84 (03/18 0637) Intake/Output from previous day: 03/17 0701 - 03/18 0700 In: 2458 [P.O.:50; I.V.:458.7; IV Piggyback:150; TPN:1799.3] Out: 2951 [Urine:2951] Intake/Output from this shift:    Labs:  Recent Labs  03/17/12 0435 03/18/12 0400 03/19/12 0530  WBC 10.9* 10.1 12.3*  HGB 8.0* 8.4* 9.6*  HCT 25.8* 26.6* 29.9*  PLT 472* 458* 520*     Recent Labs  03/17/12 0435 03/18/12 0400 03/19/12 0530  NA 135 135 137  K 5.0 4.7 4.5  CL 105 102 102  CO2 26 29 31   GLUCOSE 126* 137* 119*  BUN 53* 46* 35*  CREATININE 1.18* 0.94 0.78  CALCIUM 8.9 9.3 9.5  MG  --  2.0  --   PHOS  --  2.9  --   PROT 5.2* 5.4* 5.9*  ALBUMIN 1.9* 2.0* 2.2*  AST 14 14 14   ALT 13 15 14   ALKPHOS 75 75 93  BILITOT 0.2* 0.2* 0.3  PREALBUMIN  --  26.7  --   TRIG  --  171*  --   CHOL  --  115  --    Estimated Creatinine Clearance: 45.1 ml/min (by C-G formula based on Cr of 0.78).    Recent Labs  03/19/12 0005 03/19/12 0406 03/19/12 0750  GLUCAP 117* 120* 116*    Nutritional Goals:  - RD recs per day: 1420-1660 Kcal, 80-94 g protein, 2L fluid - Clinimix E5/15 at ml/h + lipids 86ml/hr MWF will provide 90gm protein and weekly avg of 1484 kcals (1278kcal TTSS and 1758 kcals MWF).   Current nutrition:  - Diet: sips clears 3/17 - TNA: Clinimix E 5/15 @ goal rate of 75 ml/hr - [facility]IVF: [facility] at [facility]  CBGs & Insulin requirements past 24 hours:  - CBGs improving with addition of insulin to TNA, within goal of  150mg /dl - No insulin required since last pm with change to new TPN formulation  Labs: Electrolytes: K+ = 4.5, Corr Ca = 10.9 (slightly elevated), Phos = 2.9, Na now WNL Renal Function: Scr = 0.78 Hepatic Function: wnl  Pre-Albumin: 7.9 (3/5), 12.7 (3/10), 26.7 (3/17) Triglycerides: 80 (3/5), 79 (3/10), 171 (3/17) CBGs: improved to ~goal of <150   Assessment:  65 yoF with h/o diveriticulitis presented 2/27 with abd pain, N/V and fatigue. CT abd/pelvis in ED with worsened chronic inflammatory process involving the sigmoid colon. Pt admitted for acute on chronic sigmoiditis, diverticulitis, and UTI. GI on board, pt did not improve with Cipro/Flagyl. Repeat CT 3/3 with chronic sigmoid colon thickening, small area of air either contained within diverticulum or escaped from bowel.  TNA ordered to start 3/4 given intolerance to enteral feeding. S/p sigmoid colectomy 3/13.   - 3/18: Today is TNA Day#15, Clinimix formulation changed to E5/15 last pm to reduce dextrose, infusing at goal rate of 67ml/hr. POD#5 sigmoid colectomy and colostomy.  Patient appears to be tolerating TNA at goal rate. I/O = neg fluid balance 3/17 s/p lasix 20mg  IV q12h x 4 doses, now on 20mg  IV daily. + stool documented, on sips of  clears. Prealbumin from 3/17 = 26.7  Plan:    Continue Clinimix E 5/15 at goal rate of 75 ml/hr.   K continues to improve (was at ULN). Watch K and Corr Ca and remove electrolytes if K increases  TNA to contain IV fat emulsion 20% at 10 ml/hr on MWF only due to ongoing shortage  Continue insulin 6 units/L in TNA (will receive ~11 units/24hr)  CBGs better controlled, change SSI coverage moderate scale q6h   Standard multivitamins and trace elements on MWF only due to ongoing shortage.  Patient not taking anything by mouth so unable to convert MVI to PO at this time.  TNA labs Monday/Thursdays  Juliette Alcide, PharmD, BCPS.   Pager: [phone] 03/19/2012 7:59 AM

## 2012-03-19 NOTE — Evaluation (Signed)
Occupational Therapy Re- Evaluation Patient Details Name: Katie Galvan MRN: [id number] DOB: 12-30-1927 Today's Date: 03/19/2012 Time: 4098-1191 OT Time Calculation (min): 19 min  OT Assessment / Plan / Recommendation Clinical Impression  This 79 year old female has been on caseload.  She is now s/p exploratory lap with colostomy.  Endurance and pain limit her adls.  She is appropriate for continued OT.  Upgraded goals are listed below    OT Assessment  Patient needs continued OT Services    Follow Up Recommendations  SNF    Barriers to Discharge      Equipment Recommendations  None recommended by OT    Recommendations for Other Services    Frequency  Min 2X/week    Precautions / Restrictions Precautions Precautions: Fall Precaution Comments: O2 dependent, colostomy  Restrictions Weight Bearing Restrictions: No   Pertinent Vitals/Pain Abdomen with movement:  Not rated.  Repositioned.  Educated on sidelying before sit to/from supine to minimize discomfort    ADL  Eating/Feeding: NPO Grooming: Wash/dry hands;Set up Where Assessed - Grooming: Unsupported sitting Toilet Transfer: Performed;Moderate assistance Toilet Transfer Method: Stand pivot Toilet Transfer Equipment: Bedside commode Toileting - Clothing Manipulation and Hygiene: Performed;Minimal assistance Where Assessed - Toileting Clothing Manipulation and Hygiene: Sit to stand from 3-in-1 or toilet Transfers/Ambulation Related to ADLs: pt had exploratory lap with colostomy 5 days ago.  +2 for ambulation/bed mobility per PT.  Performed SPT with +1 assist.  RN reports pt has ileus and needs to continue to move.  Assisted back to bed: pt has been up for a couple of hours ADL Comments: Fatiques easily.  Upper body adls are max assist due to fatique/decreased endurance.  LB bathing max and dressing total A.  Will introduce AE as endurance improves    OT Diagnosis: Generalized weakness  OT Problem List: Decreased  strength;Decreased activity tolerance;Impaired balance (sitting and/or standing);Decreased knowledge of use of DME or AE;Pain OT Treatment Interventions: Self-care/ADL training;DME and/or AE instruction;Patient/family education;Balance training   OT Goals Acute Rehab OT Goals OT Goal Formulation: With patient Time For Goal Achievement: 04/02/12 Potential to Achieve Goals: Good ADL Goals Pt Will Transfer to Toilet: with min assist;Stand pivot transfer;3-in-1 ADL Goal: Toilet Transfer - Progress: Goal set today Pt Will Perform Toileting - Hygiene: with min assist;Standing at 3-in-1/toilet (min guard) ADL Goal: Toileting - Hygiene - Progress: Goal set today Miscellaneous OT Goals Miscellaneous OT Goal #1: pt will complete UB adls with occasional min A from supported sitting  OT Goal: Miscellaneous Goal #1 - Progress: Goal set today Miscellaneous OT Goal #2: pt will verbalize 2 energy conservation techniques OT Goal: Miscellaneous Goal #2 - Progress: Goal set today  Visit Information  Last OT Received On: 03/19/12 Assistance Needed: +2 (can do spt with +1)    Subjective Data  Subjective: I really need to pee Patient Stated Goal: none stated   Prior Functioning     Home Living Lives With: Alone Available Help at Discharge: Skilled Nursing Facility Prior Function Level of Independence: Independent with assistive device(s) Communication Communication: No difficulties Dominant Hand: Right         Vision/Perception     Cognition  Cognition Overall Cognitive Status: Appears within functional limits for tasks assessed/performed Arousal/Alertness: Awake/alert Orientation Level: Appears intact for tasks assessed Behavior During Session: [facility] for tasks performed    Extremity/Trunk Assessment Right Upper Extremity Assessment RUE ROM/Strength/Tone: Deficits RUE ROM/Strength/Tone Deficits: can only lift RUE approximately 30 degrees today; distally wfls Left Upper Extremity  Assessment LUE  ROM/Strength/Tone: [facility] for tasks assessed (wrist deformity)     Mobility Bed Mobility Sit to Sidelying Left: 2: Max assist Details for Bed Mobility Assistance: cues for technique   Transfers Sit to Stand: 3: Mod assist;From chair/3-in-1;With armrests;With upper extremity assist  Details for Transfer Assistance: max cues for hand placement and to continue stepping     Exercise     Balance     End of Session OT - End of Session Activity Tolerance: Patient limited by fatigue Patient left: in bed;with bed alarm set;with call bell/phone within reach  GO     Linnette Panella 03/19/2012, 1:57 PM [name], OTR/L [phone] 03/19/2012

## 2012-03-19 NOTE — Progress Notes (Addendum)
[facility] PROGRESS NOTE  Katie Galvan ZOX:[id number] DOB: 31-May-1927 DOA: 02/29/2012 PCP: Michele Mcalpine, MD  Brief narrative: 79 year old female with past medical history significant for diverticulitis, ischemic cardiomyopathy (EF 30%), perimyocarditis, COPD, endocarditis, recurrent falls who presented to [facility] ED 02/29/2012 due to abdominal pain, nausea and vomiting. In ED, evaluation CT abd/pelvis with findings significant for sigmoid diverticulitis worse compared to previous imaging studies Vs colitis. Surgery and GI  following. She was very slow improvement with conservative management involving NPO and antibiotics, then started to decline and finally underwent surgery after assessing and risks and cardiac and pulmonary pre-op eval. S/p L colectomy and colostomy on 3/13 now with post op ileus  Assessment and Plan:   Principal Problem:  *Acute Sigmoid Diverticulitis  Without improvement with medical therapy Finally underwent L colectomy per Dr.Toth 3/13 Now with post op ileus Continue TNA,  was on a prolonged course of IV Fluconazole/ Zosyn stopped 3/15 supportive care with anti-emetics, pain control, ambulate Stopped Toradol due to hyperkalemia Per CCS  Hyperkalemia: corrected - possibly related to blood transfusion and NSAIDs: - treated with Novolog and dextrose X1 3/14  Hyperglycemia: stable, could be from TNA - continue SSI,  HBAIC is 5.4   UTI (lower urinary tract infection)  Secondary to E.Coli  Patient has already completed 5 days of ceftriaxone.  Chronic respiratory failure with hypoxia /COPD stable  Continue symbicort  Albuterol Q 6 hours PRN Incentive spirometry Q1H  Chronic Diastolic CHF : volume overloaded clinically,  2 D ECHO 3/5 with EF 55%  S/p cards eval for clearance Restart BB once PO intake resumed, for now continue IV Lasix 20mg  daily, Follow I/Os   Nonsustained Vtach: Electrolytes K and mag WNL Lopressor dose increased and controlled  now  Pancreatic mass  Stable and being followed by GI.   Bilateral lower extremity ulcers  Stable, daily dressing changes  Moderate protein calorie malnutrition  On TNA   DVt proph: SQ lovenox   Code Status: DNR/DNI  Family Communication: no family at bedside  Disposition Plan: back to SNF   Consultants:  Dr. Arlyce Dice ( GI)  Dr. Ezzard Standing (Surgery)  Dr. Molli Knock (Pulm)  Dr. Excell Seltzer (Cardiology) Procedures:  none Antibiotics:  Fluconazole 03/10/2012 -->3/15 Zosyn 03/10/2012 -->3/15 Flagyl 03/12/2012 -->3/14   [name], MD  [facility]  Pager [phone]   If 7PM-7AM, please contact night-coverage [email or web address] Password [facility] 03/19/2012, 7:06 PM   LOS: 19 days    HPI/Subjective: Still with constant pretty severe abd pain, no N/V, no CP or SoB, no output in colostomy bag  Objective: Filed Vitals:   03/18/12 2156 03/19/12 0030 03/19/12 0637 03/19/12 1433  BP: 125/60 159/75 155/77 148/70  Pulse: 74 79 84 80  Temp: 98.2 F (36.8 C)  98.8 F (37.1 C) 98.1 F (36.7 C)  TempSrc: Oral  Oral Oral  Resp: 20  20 18   Height:      Weight:   63.5 kg (139 lb 15.9 oz)   SpO2: 100%  100% 98%    Intake/Output Summary (Last 24 hours) at 03/19/12 1906 Last data filed at 03/19/12 1800  Gross per 24 hour  Intake   2213 ml  Output   2226 ml  Net    -13 ml    Exam:   General:  Pt is alert, oriented, follows commands appropriately, not in acute distress  Cardiovascular: Regular rate and rhythm, S1/S2, no murmurs, no rubs, no gallops  Respiratory: decreased BS at bases, no wheezing, no crackles, no rhonchi  Abdomen: Soft, tender in lower abdomen, colostomy pink, no stool, no bowel sounds present, no guarding  Extremities: No edema, pulses DP and PT palpable bilaterally  Neuro: Grossly nonfocal  Data Reviewed: Basic Metabolic Panel:  Recent Labs Lab 03/14/12 0600  03/15/12 1815 03/16/12 0350 03/17/12 0435 03/18/12 0400 03/19/12 0530  NA 136  < > 132* 132* 135 135 137   K 4.4  < > 4.9 4.8 5.0 4.7 4.5  CL 103  < > 102 102 105 102 102  CO2 28  < > 26 26 26 29 31   GLUCOSE 131*  < > 186* 166* 126* 137* 119*  BUN 28*  < > 42* 48* 53* 46* 35*  CREATININE 0.90  < > 1.01 1.12* 1.18* 0.94 0.78  CALCIUM 9.4  < > 9.2 9.1 8.9 9.3 9.5  MG 2.3  --   --   --   --  2.0  --   PHOS 2.9  --   --   --   --  2.9  --   < > = values in this interval not displayed. Liver Function Tests:  Recent Labs Lab 03/15/12 0340 03/16/12 0350 03/17/12 0435 03/18/12 0400 03/19/12 0530  AST 24 17 14 14 14   ALT 14 13 13 15 14   ALKPHOS 87 69 75 75 93  BILITOT 0.4 0.2* 0.2* 0.2* 0.3  PROT 6.3 5.4* 5.2* 5.4* 5.9*  ALBUMIN 2.1* 1.9* 1.9* 2.0* 2.2*   No results found for this basename: LIPASE, AMYLASE,  in the last 168 hours No results found for this basename: AMMONIA,  in the last 168 hours CBC:  Recent Labs Lab 03/15/12 0340 03/16/12 0350 03/17/12 0435 03/18/12 0400 03/19/12 0530  WBC 18.4* 18.9* 10.9* 10.1 12.3*  NEUTROABS  --   --   --  7.3  --   HGB 10.8* 8.5* 8.0* 8.4* 9.6*  HCT 33.6* 26.8* 25.8* 26.6* 29.9*  MCV 88.9 89.9 91.8 91.7 91.2  PLT 564* 474* 472* 458* 520*   Cardiac Enzymes: No results found for this basename: CKTOTAL, CKMB, CKMBINDEX, TROPONINI,  in the last 168 hours BNP: No components found with this basename: POCBNP,  CBG:  Recent Labs Lab 03/19/12 0406 03/19/12 0750 03/19/12 1213 03/19/12 1536 03/19/12 1801  GLUCAP 120* 116* 131* 157* 147*    Recent Results (from the past 240 hour(s))  MRSA PCR SCREENING     Status: None   Collection Time    03/14/12  5:55 PM      Result Value Range Status   MRSA by PCR NEGATIVE  NEGATIVE Final   Comment:            The GeneXpert MRSA Assay (FDA     approved for NASAL specimens     only), is one component of a     comprehensive MRSA colonization     surveillance program. It is not     intended to diagnose MRSA     infection nor to guide or     monitor treatment for     MRSA infections.      Studies: Ct Abdomen Pelvis W Contrast 03/11/2012  * IMPRESSION:  1. Chronic bowel wall thickening and inflammation through the proximal sigmoid colon  which may be slightly improved compared to most recent exam.  Differential includes chronic diverticulitis, inflammatory bowel disease and cannot exclude neoplasm.  Recommend follow up imaging or colonoscopy. 2.  Small amount free fluid the pelvis is slightly increased. 3.  Slight improvement in  right pleural effusion.      Scheduled Meds: . acetaminophen  1,000 mg Intravenous Q6H  . antiseptic oral rinse  15 mL Mouth Rinse q12n4p  . budesonide-formoterol  1 puff Inhalation BID  . chlorhexidine  15 mL Mouth Rinse BID  . enoxaparin (LOVENOX) injection  30 mg Subcutaneous Q24H  . furosemide  20 mg Intravenous Daily  . insulin aspart  0-15 Units Subcutaneous Q6H  . metoprolol  10 mg Intravenous Q6H  . ondansetron (ZOFRAN) IV  8 mg Intravenous Q6H  . pantoprazole (PROTONIX) IV  40 mg Intravenous Q24H  . sodium chloride  3 mL Intravenous Q12H  . tiotropium  18 mcg Inhalation Daily   Continuous Infusions: . sodium chloride 20 mL/hr (03/17/12 0810)  . TPN (CLINIMIX) +/- additives 75 mL/hr at 03/19/12 1729

## 2012-03-19 NOTE — Progress Notes (Signed)
Physical Therapy Treatment Patient Details Name: Katie Galvan MRN: [id number] DOB: 1927/01/22 Today's Date: 03/19/2012 Time: 4098-1191 PT Time Calculation (min): 23 min  PT Assessment / Plan / Recommendation Comments on Treatment Session  POD # 5 Exp lap with colostomy.  Progressing slowly and plans to return to [facility].    Follow Up Recommendations  SNF     Does the patient have the potential to tolerate intense rehabilitation     Barriers to Discharge        Equipment Recommendations       Recommendations for Other Services    Frequency Min 3X/week   Plan Discharge plan remains appropriate;Frequency remains appropriate    Precautions / Restrictions Precautions Precautions: Fall Precaution Comments: O2 dependent, colostomy  Restrictions Weight Bearing Restrictions: No   Pertinent Vitals/Pain C/o ABD pain with act    Mobility  Bed Mobility Bed Mobility: Supine to Sit;Sit to Supine Rolling Right: 2: Max assist Supine to Sit: 1: +2 Total assist Supine to Sit: Patient Percentage: 40% Details for Bed Mobility Assistance: Provid3ed extra assist 2nd POD #5 Exp Lap and painful ABD.   Transfers Transfers: Sit to Stand;Stand to Sit Sit to Stand: 1: +2 Total assist;From bed;From toilet Sit to Stand: Patient Percentage: 60% Stand to Sit: 1: +2 Total assist;To toilet;To bed Stand to Sit: Patient Percentage: 60% Stand Pivot Transfers: 1: +2 Total assist Stand Pivot Transfers: Patient Percentage: 60% Details for Transfer Assistance: 75% VC's on proper tech and hand placement plus trun completion prior to sit.  Pt required increased time.  Assisted from bed to [facility] to void then to amb. Ambulation/Gait Ambulation/Gait Assistance: 1: +2 Total assist Ambulation/Gait: Patient Percentage: 60% Ambulation Distance (Feet): 15 Feet (8' then 7') Assistive device: Rolling walker Ambulation/Gait Assistance Details: Max positive reinforcement to increase activity level.  Pt  demon increase anxiety with increased activity stating "don't rush me". Amb pt twice with a sitting rest break. Gait Pattern: Step-to pattern;Trunk flexed Gait velocity: decreased, very slow pace today General Gait Details: SaO2 98% on 3L O2, HR 87     PT Goals                                          progressing    Visit Information  Last PT Received On: 03/19/12 Assistance Needed: +2    Subjective Data  Subjective: I just can't do it today Patient Stated Goal: to return to [facility] rehab   Cognition       Balance   poor  End of Session PT - End of Session Equipment Utilized During Treatment: Oxygen;Gait belt Activity Tolerance: Patient limited by fatigue;Patient limited by pain Patient left: in bed;with call bell/phone within reach Nurse Communication: Mobility status   Felecia Shelling  PTA [facility]  Acute  Rehab Pager      [phone]

## 2012-03-20 LAB — COMPREHENSIVE METABOLIC PANEL
ALT: 33 U/L (ref 0–35)
BUN: 35 mg/dL — ABNORMAL HIGH (ref 6–23)
CO2: 29 mEq/L (ref 19–32)
Calcium: 9.5 mg/dL (ref 8.4–10.5)
GFR calc Af Amer: 76 mL/min — ABNORMAL LOW (ref >90)
GFR calc non Af Amer: 66 mL/min — ABNORMAL LOW (ref >90)
Glucose, Bld: 108 mg/dL — ABNORMAL HIGH (ref 70–99)
Sodium: 137 mEq/L (ref 135–145)

## 2012-03-20 LAB — GLUCOSE, CAPILLARY
Glucose-Capillary: 142 mg/dL — ABNORMAL HIGH (ref 70–99)
Glucose-Capillary: 109 mg/dL — ABNORMAL HIGH (ref 70–99)
Glucose-Capillary: 81 mg/dL (ref 70–99)

## 2012-03-20 LAB — CBC
Hemoglobin: 9 g/dL — ABNORMAL LOW (ref 12.0–15.0)
MCHC: 32.3 g/dL (ref 30.0–36.0)
RDW: 18.6 % — ABNORMAL HIGH (ref 11.5–15.5)

## 2012-03-20 MED ORDER — TRACE MINERALS CR-CU-F-FE-I-MN-MO-SE-ZN IV SOLN
INTRAVENOUS | Status: AC
  Administered 2012-03-20: 18:00:00 via INTRAVENOUS
  Filled 2012-03-20: qty 2000

## 2012-03-20 MED ORDER — FAT EMULSION 20 % IV EMUL
250.0000 mL | INTRAVENOUS | Status: AC
  Administered 2012-03-20: 250 mL via INTRAVENOUS
  Filled 2012-03-20: qty 250

## 2012-03-20 NOTE — Progress Notes (Signed)
Physical Therapy Treatment Patient Details Name: Katie Galvan MRN: [id number] DOB: September 12, 1927 Today's Date: 03/20/2012 Time: 4098-1191 PT Time Calculation (min): 16 min  PT Assessment / Plan / Recommendation Comments on Treatment Session  Pt with limited mobility today as she became cold and clammy during ambulation and returned to supine. RN notified and in to check pt.    Follow Up Recommendations  SNF     Does the patient have the potential to tolerate intense rehabilitation     Barriers to Discharge        Equipment Recommendations  Rolling walker with 5" wheels    Recommendations for Other Services    Frequency     Plan Discharge plan remains appropriate;Frequency remains appropriate    Precautions / Restrictions Precautions Precautions: Fall Precaution Comments: O2 dependent, colostomy    Pertinent Vitals/Pain Vitals and blood sugar assessed by RN after pt returned to supine due to cold and clammy during ambulation    Mobility  Bed Mobility Bed Mobility: Sit to Supine Sit to Supine: 1: +2 Total assist Sit to Supine: Patient Percentage: 30% Details for Bed Mobility Assistance: pt required increased assist due to not feeling well after ambulation Transfers Transfers: Sit to Stand;Stand to Sit Sit to Stand: 4: Min assist;With upper extremity assist;From chair/3-in-1 Stand to Sit: 2: Max assist;To bed Details for Transfer Assistance: increased time to stand due to abdominal pain, max assist to control descent due to cold and clammy after ambulation Ambulation/Gait Ambulation/Gait Assistance: 1: +2 Total assist Ambulation/Gait: Patient Percentage: 30% Ambulation Distance (Feet): 7 Feet Assistive device: Rolling walker Ambulation/Gait Assistance Details: pt reported not feeling well with a few steps and requested return to bed, pt became cold and clammy so provided assist back to bed safely and notified RN (checked vitals and blood sugar 199) (RN states she received  meds including diladid a little bit ago) Gait Pattern: Step-to pattern;Trunk flexed;Decreased stride length Gait velocity: decreased General Gait Details: remained on 2L O2 [place]    Exercises     PT Diagnosis:    PT Problem List:   PT Treatment Interventions:     PT Goals Acute Rehab PT Goals PT Goal Formulation: With patient PT Goal: Sit to Supine/Side - Progress: Progressing toward goal PT Goal: Sit to Stand - Progress: Progressing toward goal PT Goal: Stand to Sit - Progress: Progressing toward goal PT Goal: Ambulate - Progress: Progressing toward goal  Visit Information  Last PT Received On: 03/20/12 Assistance Needed: +2    Subjective Data  Subjective: This is the worst I've felt since I've been here.   Cognition  Cognition Overall Cognitive Status: Appears within functional limits for tasks assessed/performed Arousal/Alertness: Awake/alert Orientation Level: Appears intact for tasks assessed Behavior During Session: [facility] for tasks performed    Balance     End of Session PT - End of Session Equipment Utilized During Treatment: Oxygen Activity Tolerance: Patient limited by fatigue;Other (comment) (became cold and clammy during ambulation) Patient left: with call bell/phone within reach;in bed;with nursing in room   GP     [facility] E 03/20/2012, 2:49 PM Zenovia Jarred, PT, DPT 03/20/2012 Pager: [phone]

## 2012-03-20 NOTE — Progress Notes (Signed)
Called by PT to assess pt as she became cold and clammy during ambulation.  Pt assisted to bed and placed in supine position.  Vital signs were stable & CBG 109. Will continue to monitor/assess pt. Maeola Harman

## 2012-03-20 NOTE — Progress Notes (Signed)
Patient ID: Katie Galvan, female   DOB: Jun 26, 1927, 79 y.o.   MRN: [id number]  General Surgery - [facility] - Progress Note  POD# 6  Subjective: Patient up to chair!  Needs to ambulate next - states she walked yesterday.  No complaints.  Wants to try some liquids.  Objective: Vital signs in last 24 hours: Temp:  [97.5 F (36.4 C)-98.1 F (36.7 C)] 98 F (36.7 C) (03/19 0621) Pulse Rate:  [63-80] 63 (03/19 0621) Resp:  [18] 18 (03/19 0621) BP: (140-178)/(53-74) 178/53 mmHg (03/19 0621) SpO2:  [97 %-100 %] 99 % (03/19 0759) Weight:  [139 lb 5.3 oz (63.2 kg)] 139 lb 5.3 oz (63.2 kg) (03/19 0621) Last BM Date: 03/17/12  Intake/Output from previous day: 03/18 0701 - 03/19 0700 In: 2365 [I.V.:370; IV Piggyback:400; TPN:1595] Out: 1525 [Urine:1525]  Exam: HEENT - clear, not icteric Neck - soft Chest - clear bilaterally Cor - RRR, no murmur Abd - soft, mild distension; midline wound clear and dry; stoma viable - no output yet Ext - no significant edema Neuro - grossly intact, no focal deficits  Lab Results:   Recent Labs  03/19/12 0530 03/20/12 0400  WBC 12.3* 11.3*  HGB 9.6* 9.0*  HCT 29.9* 27.9*  PLT 520* 481*     Recent Labs  03/19/12 0530 03/20/12 0400  NA 137 137  K 4.5 4.2  CL 102 103  CO2 31 29  GLUCOSE 119* 108*  BUN 35* 35*  CREATININE 0.78 0.81  CALCIUM 9.5 9.5    Studies/Results: No results found.  Assessment / Plan: 1.  Status post Hartmann's resection  Will begin clear liquids today  Encouraged OOB, ambulation  Velora Heckler, MD, [facility] Office: [phone]  03/20/2012

## 2012-03-20 NOTE — Progress Notes (Signed)
Patient ID: Katie Galvan, female   DOB: [birth year], 79 y.o.   MRN: [id number]  [facility] PROGRESS NOTE  Katie Galvan WUJ:[id number] DOB: 15-Nov-1927 DOA: 02/29/2012 PCP: Michele Mcalpine, MD  Brief narrative: Pt is 79 year old female with complex medical history including diverticulitis, ischemic cardiomyopathy (EF 30%), perimyocarditis, COPD, endocarditis, recurrent falls, who presented to [facility] ED 02/29/2012 due to abdominal pain, nausea and vomiting. In ED, CT abd/pelvis consistent with sigmoid diverticulitis worse compared to previous imaging studies. Surgery and GI following. Pt is now s/p L colectomy and colostomy on 3/13 and with subsequent development of post op ileus   Assessment and Plan:  Principal Problem:  *Acute Sigmoid Diverticulitis  Status post left colectomy and colostomy placement, post op day #6 Plan on trial of clear liquids today, OOB and ambulation  Hyperkalemia: corrected   possibly related to blood transfusion and NSAIDs  Stable and within normal limits this am, BMP in AM Hyperglycemia  stable, could be from TNA   continue SSI, HBAIC is 5.4  UTI (lower urinary tract infection)  Secondary to E.Coli  Patient completed 5 days of ceftriaxone.  Chronic respiratory failure with hypoxia /COPD  Stable, pt maintaining oxygen saturations at target range   Continue symbicort, Albuterol Q 6 hours PRN, Incentive spirometry Q1H  Chronic Diastolic CHF : 2 D ECHO 3/5 with EF 55%  S/p cards eval for clearance  Restart BB once PO intake resumed, for now continue IV, renal function stable  Lasix 20mg  daily, Follow I/Os Nonsustained Vtach:   Resolved  Continue Lopressor  Pancreatic mass  Stable and being followed by GI.  Bilateral lower extremity ulcers  Stable, daily dressing changes Moderate protein calorie malnutrition  On TNA, plan on trial of clear liquids today   Consultants:  Dr. Arlyce Dice ( GI)  Dr. Ezzard Standing (Surgery)  Dr. Molli Knock (Pulm)  Dr. Excell Seltzer  (Cardiology) Procedures:   Left colectomy and colostomy 03/14/2012 by Dr. Carolynne Edouard  Antibiotics:  Fluconazole 03/10/2012 -->3/15  Zosyn 03/10/2012 -->3/15  Flagyl 03/12/2012 -->3/14  Code Status: DNR Family Communication: Pt at bedside Disposition Plan: SNF when stable, plan on palliative care consult to discuss goals of care  HPI/Subjective: No events overnight.   Objective: Filed Vitals:   03/19/12 1433 03/19/12 2118 03/20/12 0621 03/20/12 0759  BP: 148/70 140/74 178/53   Pulse: 80 74 63   Temp: 98.1 F (36.7 C) 97.5 F (36.4 C) 98 F (36.7 C)   TempSrc: Oral Oral Oral   Resp: 18 18 18    Height:      Weight:   63.2 kg (139 lb 5.3 oz)   SpO2: 98% 97% 100% 99%    Intake/Output Summary (Last 24 hours) at 03/20/12 1214 Last data filed at 03/20/12 1126  Gross per 24 hour  Intake   2315 ml  Output   2150 ml  Net    165 ml    Exam:   General:  Pt is somewhat somnolent this AM but follows commands appropriately, not in acute distress  Cardiovascular: Regular rate and rhythm, S1/S2, no murmurs, no rubs, no gallops  Respiratory: Clear to auscultation bilaterally, no wheezing, no crackles, no rhonchi, decreased breath sounds at bases   Abdomen: Soft, non tender, non distended, no guarding  Extremities: No edema, pulses DP and PT palpable bilaterally  Neuro: Grossly nonfocal  Data Reviewed: Basic Metabolic Panel:  Recent Labs Lab 03/14/12 0600  03/16/12 0350 03/17/12 0435 03/18/12 0400 03/19/12 0530 03/20/12 0400  NA 136  < > 132*  135 135 137 137  K 4.4  < > 4.8 5.0 4.7 4.5 4.2  CL 103  < > 102 105 102 102 103  CO2 28  < > 26 26 29 31 29   GLUCOSE 131*  < > 166* 126* 137* 119* 108*  BUN 28*  < > 48* 53* 46* 35* 35*  CREATININE 0.90  < > 1.12* 1.18* 0.94 0.78 0.81  CALCIUM 9.4  < > 9.1 8.9 9.3 9.5 9.5  MG 2.3  --   --   --  2.0  --   --   PHOS 2.9  --   --   --  2.9  --   --   < > = values in this interval not displayed.  Liver Function Tests:  Recent  Labs Lab 03/16/12 0350 03/17/12 0435 03/18/12 0400 03/19/12 0530 03/20/12 0400  AST 17 14 14 14 29   ALT 13 13 15 14  33  ALKPHOS 69 75 75 93 130*  BILITOT 0.2* 0.2* 0.2* 0.3 0.3  PROT 5.4* 5.2* 5.4* 5.9* 5.7*  ALBUMIN 1.9* 1.9* 2.0* 2.2* 2.1*   CBC:  Recent Labs Lab 03/16/12 0350 03/17/12 0435 03/18/12 0400 03/19/12 0530 03/20/12 0400  WBC 18.9* 10.9* 10.1 12.3* 11.3*  NEUTROABS  --   --  7.3  --   --   HGB 8.5* 8.0* 8.4* 9.6* 9.0*  HCT 26.8* 25.8* 26.6* 29.9* 27.9*  MCV 89.9 91.8 91.7 91.2 91.5  PLT 474* 472* 458* 520* 481*   CBG:  Recent Labs Lab 03/19/12 1536 03/19/12 1801 03/20/12 0019 03/20/12 0620 03/20/12 1134  GLUCAP 157* 147* 142* 135* 148*   Scheduled Meds: . budesonide-formoterol  1 puff Inhalation BID  . enoxaparin injection  40 mg Subcutaneous Q24H  . furosemide  20 mg Intravenous Daily  . insulin aspart  0-15 Units Subcutaneous Q6H  . metoprolol  10 mg Intravenous Q6H  . ondansetron  IV  8 mg Intravenous Q6H  . pantoprazole IV  40 mg Intravenous Q24H  . tiotropium  18 mcg Inhalation Daily   Continuous Infusions: . sodium chloride 20 mL/hr at 03/20/12 0011  . TPN (CLINIMIX) +/- additives     And  . fat emulsion    . TPN (CLINIMIX) +/- additives 75 mL/hr at 03/19/12 1729   Katie Presto, MD  [facility] Pager [phone]  If 7PM-7AM, please contact night-coverage [email or web address] Password [email or web address] 03/20/2012, 12:14 PM   LOS: 20 days

## 2012-03-20 NOTE — Progress Notes (Signed)
Palliative Medicine Team consult for goals of care requested by Dr Izola Price; spoke with patient at bedside, no family present- patient very lethargic able to answer questions briefly-stated she was agreeable to this writer contacting her son- however kept falling asleep during visit.  Contacted patient's son [name] [phone]; he stated he would like to talk with Dr Izola Price prior to setting any meeting time with PMT- he requests MD to contact him via his cell # [phone] Will make Dr Izola Price aware of this request and follow-up tomorrow to see if PMT consult is still needed    Valente David, RN 03/20/2012, 1:38 PM Palliative Medicine Team RN Liaison [phone]

## 2012-03-20 NOTE — Progress Notes (Signed)
PARENTERAL NUTRITION CONSULT NOTE - FOLLOW UP  Pharmacy Consult for TNA Indication: Intolerance to enteral feeding s/p sigmoid colon resection 3/13 for diverticulitis  Allergies  Allergen Reactions  . Sulfonamide Derivatives Swelling    Patient Measurements: Height: 5' (152.4 cm) Weight: 139 lb 5.3 oz (63.2 kg) IBW/kg (Calculated) : 45.5 Usual Weight: 65 kg  Vital Signs: Temp: 98 F (36.7 C) (03/19 0621) Temp src: Oral (03/19 0621) BP: 178/53 mmHg (03/19 0621) Pulse Rate: 63 (03/19 0621) Intake/Output from previous day: 03/18 0701 - 03/19 0700 In: 2365 [I.V.:370; IV Piggyback:400; TPN:1595] Out: 1525 [Urine:1525] Intake/Output from this shift:    Labs:  Recent Labs  03/18/12 0400 03/19/12 0530 03/20/12 0400  WBC 10.1 12.3* 11.3*  HGB 8.4* 9.6* 9.0*  HCT 26.6* 29.9* 27.9*  PLT 458* 520* 481*     Recent Labs  03/18/12 0400 03/19/12 0530 03/20/12 0400  NA 135 137 137  K 4.7 4.5 4.2  CL 102 102 103  CO2 29 31 29   GLUCOSE 137* 119* 108*  BUN 46* 35* 35*  CREATININE 0.94 0.78 0.81  CALCIUM 9.3 9.5 9.5  MG 2.0  --   --   PHOS 2.9  --   --   PROT 5.4* 5.9* 5.7*  ALBUMIN 2.0* 2.2* 2.1*  AST 14 14 29   ALT 15 14 33  ALKPHOS 75 93 130*  BILITOT 0.2* 0.3 0.3  PREALBUMIN 26.7  --   --   TRIG 171*  --   --   CHOL 115  --   --    Estimated Creatinine Clearance: 44.5 ml/min (by C-G formula based on Cr of 0.81).    Recent Labs  03/19/12 1801 03/20/12 0019 03/20/12 0620  GLUCAP 147* 142* 135*    Nutritional Goals:  - RD recs per day: 1420-1660 Kcal, 80-94 g protein, 2L fluid - Clinimix E5/15 at 50ml/h + lipids 70ml/hr MWF will provide 90gm protein and weekly avg of 1484 kcals (1278kcal TTSS and 1758 kcals MWF).   Current nutrition:  - Diet: sips clears 3/17 - TNA: Clinimix E 5/15 @ goal rate of 75 ml/hr - mIVF: NS at [facility]  CBGs & Insulin requirements past 24 hours:  - CBGs improving with addition of insulin to TNA, within goal of 150mg /dl - 8  units SSI insulin  Labs: Electrolytes: K+ = 4.2, Corr Ca = 11 (slightly elevated), Phos = 2.9, Na now WNL Renal Function: Scr = 0.81 (on lasix 20mg  IV daily) Hepatic Function: wnl  Pre-Albumin: 7.9 (3/5), 12.7 (3/10), 26.7 (3/17) Triglycerides: 80 (3/5), 79 (3/10), 171 (3/17) CBGs: slightly increased last 24h, near goal of 150mg /dl   Assessment:  87 yoF with h/o diveriticulitis presented 2/27 with abd pain, N/V and fatigue. CT abd/pelvis in ED with worsened chronic inflammatory process involving the sigmoid colon. Pt admitted for acute on chronic sigmoiditis, diverticulitis, and UTI. GI on board, pt did not improve with Cipro/Flagyl. Repeat CT 3/3 with chronic sigmoid colon thickening, small area of air either contained within diverticulum or escaped from bowel.  TNA ordered to start 3/4 given intolerance to enteral feeding. S/p sigmoid colectomy 3/13.   - 3/19: Today is TNA Day#16, Clinimix formulation changed to E5/15 3/17 pm to reduce dextrose, infusing at goal rate of 54ml/hr. POD#6 sigmoid colectomy and colostomy.  Now post-op ileus. Patient appears to be tolerating TNA at goal rate. Patient not wanting to get OOB. On sips of clears. Prealbumin from 3/17 = 26.7  Plan:    Continue Clinimix E  5/15 at goal rate of 75 ml/hr.   cliniK continues to improve (was at [facility]). Watch K and Corr Ca and remove electrolytes if Ca continues to increase  TNA to contain IV fat emulsion 20% at 10 ml/hr on MWF only due to ongoing shortage  Increase insulin 8 units/L in TNA (will receive ~14 units/24hr)  Continue SSI coverage moderate scale q6h   Standard multivitamins and trace elements on MWF only due to ongoing shortage.  Patient not taking anything by mouth so unable to convert MVI to PO at this time.  TNA labs Monday/Thursdays  Juliette Alcide, PharmD, BCPS.   Pager: [phone] 03/20/2012 7:14 AM

## 2012-03-20 NOTE — Progress Notes (Signed)
Nutrition Brief Note  Spoke with pt to address PO intake and to provide new colostomy diet education.  Pt reports that she hasn't had anything to drink first thing this morning. Pt made aware that she can have sips of clears and pt requested having some chicken broth- RN made aware. Encouraged pt to drink clear liquids as tolerated.  Provided "Colostomy Nutrition Therapy" handout from the Academy of [employer]. Briefly discussed diet tips for new colostomy, foods recommended, and foods not recommended. Pt voiced understanding. Expect good compliance. RD name and contact information provided. Pt has no further questions or concerns at this time. Will continue to monitor.  Ian Malkin RD, LDN Inpatient Clinical Dietitian Pager: [phone] After Hours Pager: [phone]

## 2012-03-21 LAB — GLUCOSE, CAPILLARY
Glucose-Capillary: 110 mg/dL — ABNORMAL HIGH (ref 70–99)
Glucose-Capillary: 147 mg/dL — ABNORMAL HIGH (ref 70–99)

## 2012-03-21 LAB — COMPREHENSIVE METABOLIC PANEL
BUN: 34 mg/dL — ABNORMAL HIGH (ref 6–23)
CO2: 28 mEq/L (ref 19–32)
Chloride: 104 mEq/L (ref 96–112)
Creatinine, Ser: 0.88 mg/dL (ref 0.50–1.10)
GFR calc Af Amer: 69 mL/min — ABNORMAL LOW (ref >90)
GFR calc non Af Amer: 60 mL/min — ABNORMAL LOW (ref >90)
Total Bilirubin: 0.2 mg/dL — ABNORMAL LOW (ref 0.3–1.2)

## 2012-03-21 LAB — MAGNESIUM: Magnesium: 2.1 mg/dL (ref 1.5–2.5)

## 2012-03-21 LAB — CBC
Platelets: 505 10*3/uL — ABNORMAL HIGH (ref 150–400)
RBC: 3.09 MIL/uL — ABNORMAL LOW (ref 3.87–5.11)
WBC: 13.7 10*3/uL — ABNORMAL HIGH (ref 4.0–10.5)

## 2012-03-21 MED ORDER — INSULIN REGULAR HUMAN 100 UNIT/ML IJ SOLN
INTRAMUSCULAR | Status: AC
  Administered 2012-03-21: 18:00:00 via INTRAVENOUS
  Filled 2012-03-21: qty 2000

## 2012-03-21 NOTE — Progress Notes (Signed)
Physical Therapy Treatment Patient Details Name: Katie Galvan MRN: [id number] DOB: Aug 31, 1927 Today's Date: 03/21/2012 Time: 5284-1324 PT Time Calculation (min): 12 min  PT Assessment / Plan / Recommendation Comments on Treatment Session  pt continues to require maximium encouragement for all mobility, having refused upon the previous 2 attempts today; pt needs to mobilize more; will continue to follow and encourage pt    Follow Up Recommendations  SNF     Does the patient have the potential to tolerate intense rehabilitation     Barriers to Discharge        Equipment Recommendations  Rolling walker with 5" wheels    Recommendations for Other Services OT consult  Frequency Min 3X/week   Plan Discharge plan remains appropriate;Frequency remains appropriate    Precautions / Restrictions Precautions Precautions: Fall Precaution Comments: O2 dependent, colostomy    Pertinent Vitals/Pain C/o abd pain, not rated, RN present    Mobility  Bed Mobility Bed Mobility: Sit to Supine Sit to Supine: 3: Mod assist;HOB flat Details for Bed Mobility Assistance: pt requires assist with LEs and assist to position once in bed; fatigues rapidly Transfers Transfers: Sit to Stand;Stand to Sit Sit to Stand: 4: Min assist;With upper extremity assist;From chair/3-in-1 Stand to Sit: 4: Min assist;To bed;To chair/3-in-1 Stand Pivot Transfers: 1: +2 Total assist Stand Pivot Transfers: Patient Percentage: 60% Details for Transfer Assistance: increased time for transfers due to abdominal pain; requires multi-modal cues for hand placement and wt shift Ambulation/Gait Ambulation/Gait Assistance: 1: +2 Total assist Ambulation/Gait: Patient Percentage: 60% Ambulation Distance (Feet): 6 Feet Assistive device: Rolling walker Ambulation/Gait Assistance Details: pt requires multi-modal cues for RW position,  safety,  and posture  Gait Pattern: Step-to pattern;Trunk flexed;Decreased stride length Gait  velocity: decreased General Gait Details: remianed on on O2, requires max encouragement to take any steps    Exercises     PT Diagnosis:    PT Problem List:   PT Treatment Interventions:     PT Goals Acute Rehab PT Goals Time For Goal Achievement: 04/02/12 Potential to Achieve Goals: Good Pt will go Sit to Supine/Side: with min assist PT Goal: Sit to Supine/Side - Progress: Progressing toward goal Pt will go Sit to Stand: with supervision PT Goal: Sit to Stand - Progress: Progressing toward goal Pt will go Stand to Sit: with supervision PT Goal: Stand to Sit - Progress: Progressing toward goal Pt will Ambulate: 16 - 50 feet;with min assist;with least restrictive assistive device PT Goal: Ambulate - Progress: Progressing toward goal  Visit Information  Last PT Received On: 03/21/12 Assistance Needed: +2 (multiple lines, O2 )    Subjective Data  Subjective: I don't want to walk Patient Stated Goal: to return to [facility] rehab   Cognition  Cognition Overall Cognitive Status: Appears within functional limits for tasks assessed/performed Arousal/Alertness: Awake/alert Orientation Level: Appears intact for tasks assessed Behavior During Session: [facility] for tasks performed    Balance  Static Sitting Balance Static Sitting - Balance Support: No upper extremity supported;Feet supported Static Sitting - Level of Assistance: 6: Modified independent (Device/Increase time) Dynamic Standing Balance Dynamic Standing - Balance Support: Bilateral upper extremity supported;During functional activity Dynamic Standing - Level of Assistance: 4: Min assist;3: Mod assist  End of Session PT - End of Session Equipment Utilized During Treatment: Oxygen Activity Tolerance: Patient limited by fatigue;Patient limited by pain Patient left: with call bell/phone within reach;in bed;with nursing in room Nurse Communication: Mobility status   GP     [facility] 03/21/2012, 1:53  PM   

## 2012-03-21 NOTE — Progress Notes (Signed)
Patient ID: Katie Galvan, female   DOB: 1927/02/05, 79 y.o.   MRN: [id number]  General Surgery - [facility] - Progress Note  POD# 7  Subjective: Patient without complaints.  Wants her Foley catheter back.  Doesn't want to get up.  Objective: Vital signs in last 24 hours: Temp:  [97.7 F (36.5 C)-98.9 F (37.2 C)] 97.7 F (36.5 C) (03/20 0622) Pulse Rate:  [64-83] 64 (03/20 0622) Resp:  [18] 18 (03/20 0622) BP: (102-138)/(54-88) 102/54 mmHg (03/20 0622) SpO2:  [95 %-98 %] 95 % (03/20 0905) Weight:  [141 lb 4.8 oz (64.093 kg)] 141 lb 4.8 oz (64.093 kg) (03/20 0622) Last BM Date: 03/21/12  Intake/Output from previous day: 03/19 0701 - 03/20 0700 In: 1870.8 [I.V.:343; IV Piggyback:200; TPN:1327.8] Out: 1775 [Urine:1775]  Exam: HEENT - clear, not icteric Neck - soft Chest - clear bilaterally Cor - RRR, no murmur Abd - soft without distension; wound clear and dry; liquid stool in ostomy bag; few BS present Ext - no significant edema Neuro - grossly intact, no focal deficits  Lab Results:   Recent Labs  03/20/12 0400 03/21/12 0420  WBC 11.3* 13.7*  HGB 9.0* 9.1*  HCT 27.9* 28.6*  PLT 481* 505*     Recent Labs  03/20/12 0400 03/21/12 0420  NA 137 139  K 4.2 4.7  CL 103 104  CO2 29 28  GLUCOSE 108* 85  BUN 35* 34*  CREATININE 0.81 0.88  CALCIUM 9.5 9.5    Studies/Results: No results found.  Assessment / Plan: 1.  Status post Hartmann's resection  Ileus resolving - output from stoma today for first time  Advance to full liquid diet  Encouraged OOB, ambulation  Disposition? - will probably need Rehab consult - per medical service  Velora Heckler, MD, [facility] Office: [phone]  03/21/2012

## 2012-03-21 NOTE — Progress Notes (Signed)
PARENTERAL NUTRITION CONSULT NOTE - FOLLOW UP  Pharmacy Consult for TNA Indication: Intolerance to enteral feeding s/p sigmoid colon resection 3/13 for diverticulitis  Allergies  Allergen Reactions  . Sulfonamide Derivatives Swelling    Patient Measurements: Height: 5' (152.4 cm) Weight: 141 lb 4.8 oz (64.093 kg) IBW/kg (Calculated) : 45.5 Usual Weight: 65 kg  Vital Signs: Temp: 97.7 F (36.5 C) (03/20 0622) Temp src: Oral (03/20 0622) BP: 102/54 mmHg (03/20 0622) Pulse Rate: 64 (03/20 0622) Intake/Output from previous day: 03/19 0701 - 03/20 0700 In: 1870.8 [I.V.:343; IV Piggyback:200; TPN:1327.8] Out: 1775 [Urine:1775] Intake/Output from this shift:    Labs:  Recent Labs  03/19/12 0530 03/20/12 0400 03/21/12 0420  WBC 12.3* 11.3* 13.7*  HGB 9.6* 9.0* 9.1*  HCT 29.9* 27.9* 28.6*  PLT 520* 481* 505*     Recent Labs  03/19/12 0530 03/20/12 0400 03/21/12 0420  NA 137 137 139  K 4.5 4.2 4.7  CL 102 103 104  CO2 31 29 28   GLUCOSE 119* 108* 85  BUN 35* 35* 34*  CREATININE 0.78 0.81 0.88  CALCIUM 9.5 9.5 9.5  MG  --   --  2.1  PHOS  --   --  3.2  PROT 5.9* 5.7* 5.8*  ALBUMIN 2.2* 2.1* 2.2*  AST 14 29 46*  ALT 14 33 51*  ALKPHOS 93 130* 129*  BILITOT 0.3 0.3 0.2*   Estimated Creatinine Clearance: 41.2 ml/min (by C-G formula based on Cr of 0.88).    Recent Labs  03/20/12 1710 03/21/12 0020 03/21/12 0631  GLUCAP 81 147* 121*    Nutritional Goals:  - RD recs per day: 1420-1660 Kcal, 80-94 g protein, 2L fluid - Clinimix E5/15 at 21ml/h + lipids 34ml/hr MWF will provide 90gm protein and weekly avg of 1484 kcals (1278kcal TTSS and 1758 kcals MWF).   Current nutrition:  - Diet: sips clears 3/17 - diet reported as 0% meals eaten on 3/19 - TNA: Clinimix E 5/15 @ goal rate of 75 ml/hr - mIVF: NS at [facility]  CBGs & Insulin requirements past 24 hours:  - CBGs improving with addition of insulin to TNA, within goal of 150mg /dl - 4 units SSI  insulin  Labs: Electrolytes: WNL Renal Function: Scr = 0.81 (on lasix 20mg  IV daily) Hepatic Function: AST/ALT have increased to 46/51; Alk phos increased to 129 -- Will repeat LFT's in AM Pre-Albumin: 7.9 (3/5), 12.7 (3/10), 26.7 (3/17) Triglycerides: 80 (3/5), 79 (3/10), 171 (3/17)  CBGs:, near goal of 150mg /dl   Assessment:  Katie Galvan with h/o diveriticulitis presented 2/27 with abd pain, N/V and fatigue. CT abd/pelvis in ED with worsened chronic inflammatory process involving the sigmoid colon. Pt admitted for acute on chronic sigmoiditis, diverticulitis, and UTI. GI on board, pt did not improve with Cipro/Flagyl. Repeat CT 3/3 with chronic sigmoid colon thickening, small area of air either contained within diverticulum or escaped from bowel.  TNA ordered to start 3/4 given intolerance to enteral feeding. S/p sigmoid colectomy and colostomy 3/13.  Now with post-op ileus.  Today is Day #17 TNA. Clinimix formulation changed to E5/15 3/17 pm to reduce dextrose, infusing at goal rate of 28ml/hr.  Patient appears to be tolerating TNA at goal rate. Patient not wanting to get OOB. On sips of clears.   Plan:    Continue Clinimix E 5/15 at goal rate of 75 ml/hr.    Watch K and Corr Ca and remove electrolytes if Ca continues to increase  TNA to contain IV  fat emulsion 20% at 10 ml/hr on MWF only due to ongoing shortage  Continue insulin 8 units/L in TNA (will receive ~14 units/24hr)  Repeat CMET in AM and f/u LFT's - If continue to trend up and TNA continues, will  Consider changing to cyclic TNA  Continue SSI coverage moderate scale q6h   Standard multivitamins and trace elements on MWF only due to ongoing shortage.  Patient not taking anything by mouth so unable to convert MVI to PO at this time.  TNA labs Monday/Thursdays  Clydene Fake PharmD Pager #: [phone] 8:32 AM 03/21/2012

## 2012-03-21 NOTE — Progress Notes (Signed)
Patient ID: Katie Galvan, female   DOB: 21-Aug-1927, 79 y.o.   MRN: [id number] [facility] PROGRESS NOTE  Deijah Spikes HYQ:[id number] DOB: Oct 03, 1927 DOA: 02/29/2012 PCP: Michele Mcalpine, MD  Brief narrative:  Pt is 79 year old female with complex medical history including diverticulitis, ischemic cardiomyopathy (EF 30%), perimyocarditis, COPD, endocarditis, recurrent falls, who presented to [facility] ED 02/29/2012 due to abdominal pain, nausea and vomiting. In ED, CT abd/pelvis consistent with sigmoid diverticulitis worse compared to previous imaging studies. Surgery and GI following. Pt is now s/p L colectomy and colostomy on 3/13 and with subsequent development of post op ileus   Assessment and Plan:  Principal Problem:  *Acute Sigmoid Diverticulitis   Status post left colectomy and colostomy placement, post op day #7  Advance to full liquid diet, OOB and ambulation  Hyperkalemia: corrected   Stable and within normal limits this am, BMP in AM Hyperglycemia   stable  continue SSI, HBAIC is 5.4  UTI (lower urinary tract infection)   Secondary to E.Coli, Patient completed 5 days of ceftriaxone.  Chronic respiratory failure with hypoxia /COPD   Stable, pt maintaining oxygen saturations at target range   Continue symbicort, Albuterol Q 6 hours PRN, Incentive spirometry Q1H  Chronic Diastolic CHF :   2 D ECHO 3/5 with EF 55%   Restart BB once PO intake resumed, for now continue IV, renal function stable   Lasix 20mg  daily, Follow I/Os Bilateral lower extremity ulcers   Stable, daily dressing changes Moderate protein calorie malnutrition   On TNA, full liquids today   Consultants:  Dr. Arlyce Dice ( GI)  Dr. Ezzard Standing (Surgery)  Dr. Molli Knock (Pulm)  Dr. Excell Seltzer (Cardiology) Procedures:  Left colectomy and colostomy 03/14/2012 by Dr. Carolynne Edouard  Antibiotics:  Fluconazole 03/10/2012 -->3/15  Zosyn 03/10/2012 -->3/15  Flagyl 03/12/2012 -->3/14  Code Status: DNR  Family Communication: Pt  and son at bedside  Disposition Plan: SNF when stable  HPI/Subjective: No events overnight.   Objective: Filed Vitals:   03/20/12 2045 03/20/12 2215 03/21/12 0622 03/21/12 0905  BP:  138/62 102/54   Pulse:  83 64   Temp:  98.9 F (37.2 C) 97.7 F (36.5 C)   TempSrc:  Oral Oral   Resp:  18 18   Height:      Weight:   64.093 kg (141 lb 4.8 oz)   SpO2: 95% 98% 96% 95%    Intake/Output Summary (Last 24 hours) at 03/21/12 1315 Last data filed at 03/21/12 1130  Gross per 24 hour  Intake 1820.83 ml  Output   1575 ml  Net 245.83 ml    Exam:   General:  Pt is alert, follows commands appropriately, not in acute distress  Cardiovascular: Regular rate and rhythm, S1/S2, no murmurs, no rubs, no gallops  Respiratory: Clear to auscultation bilaterally, no wheezing, no crackles, no rhonchi  Abdomen: Soft, non tender, non distended, soft bowel sounds  Extremities: No edema, pulses DP and PT palpable bilaterally  Neuro: Grossly nonfocal  Data Reviewed: Basic Metabolic Panel:  Recent Labs Lab 03/17/12 0435 03/18/12 0400 03/19/12 0530 03/20/12 0400 03/21/12 0420  NA 135 135 137 137 139  K 5.0 4.7 4.5 4.2 4.7  CL 105 102 102 103 104  CO2 26 29 31 29 28   GLUCOSE 126* 137* 119* 108* 85  BUN 53* 46* 35* 35* 34*  CREATININE 1.18* 0.94 0.78 0.81 0.88  CALCIUM 8.9 9.3 9.5 9.5 9.5  MG  --  2.0  --   --  2.1  PHOS  --  2.9  --   --  3.2   Liver Function Tests:  Recent Labs Lab 03/17/12 0435 03/18/12 0400 03/19/12 0530 03/20/12 0400 03/21/12 0420  AST 14 14 14 29  46*  ALT 13 15 14  33 51*  ALKPHOS 75 75 93 130* 129*  BILITOT 0.2* 0.2* 0.3 0.3 0.2*  PROT 5.2* 5.4* 5.9* 5.7* 5.8*  ALBUMIN 1.9* 2.0* 2.2* 2.1* 2.2*  CBC:  Recent Labs Lab 03/17/12 0435 03/18/12 0400 03/19/12 0530 03/20/12 0400 03/21/12 0420  WBC 10.9* 10.1 12.3* 11.3* 13.7*  NEUTROABS  --  7.3  --   --   --   HGB 8.0* 8.4* 9.6* 9.0* 9.1*  HCT 25.8* 26.6* 29.9* 27.9* 28.6*  MCV 91.8 91.7 91.2  91.5 92.6  PLT 472* 458* 520* 481* 505*  CBG:  Recent Labs Lab 03/20/12 1134 03/20/12 1352 03/20/12 1710 03/21/12 0020 03/21/12 0631  GLUCAP 148* 109* 81 147* 121*    Recent Results (from the past 240 hour(s))  MRSA PCR SCREENING     Status: None   Collection Time    03/14/12  5:55 PM      Result Value Range Status   MRSA by PCR NEGATIVE  NEGATIVE Final   Comment:            The GeneXpert MRSA Assay (FDA     approved for NASAL specimens     only), is one component of a     comprehensive MRSA colonization     surveillance program. It is not     intended to diagnose MRSA     infection nor to guide or     monitor treatment for     MRSA infections.     Scheduled Meds: . budesonide-formoterol  1 puff Inhalation BID  . enoxaparin injection  40 mg Subcutaneous Q24H  . furosemide  20 mg Intravenous Daily  . insulin aspart  0-15 Units Subcutaneous Q6H  . metoprolol  10 mg Intravenous Q6H  . ondansetron  IV  8 mg Intravenous Q6H  . pantoprazole IV  40 mg Intravenous Q24H  . tiotropium  18 mcg Inhalation Daily   Continuous Infusions: . sodium chloride 20 mL/hr at 03/20/12 0011  . TPN (CLINIMIX) +/- additives 75 mL/hr at 03/20/12 1749   And  . fat emulsion 250 mL (03/20/12 1750)  . TPN [facility]) +/- additives       Debbora Presto, MD  [facility] Pager [phone]  If 7PM-7AM, please contact night-coverage [email or web address] Password [email or web address] 03/21/2012, 1:15 PM   LOS: 21 days

## 2012-03-22 LAB — GLUCOSE, CAPILLARY
Glucose-Capillary: 129 mg/dL — ABNORMAL HIGH (ref 70–99)
Glucose-Capillary: 134 mg/dL — ABNORMAL HIGH (ref 70–99)
Glucose-Capillary: 144 mg/dL — ABNORMAL HIGH (ref 70–99)

## 2012-03-22 LAB — COMPREHENSIVE METABOLIC PANEL
AST: 31 U/L (ref 0–37)
Albumin: 2.1 g/dL — ABNORMAL LOW (ref 3.5–5.2)
Chloride: 103 mEq/L (ref 96–112)
Creatinine, Ser: 0.83 mg/dL (ref 0.50–1.10)
Potassium: 4.3 mEq/L (ref 3.5–5.1)
Total Bilirubin: 0.2 mg/dL — ABNORMAL LOW (ref 0.3–1.2)
Total Protein: 5.6 g/dL — ABNORMAL LOW (ref 6.0–8.3)

## 2012-03-22 LAB — CBC
HCT: 27.8 % — ABNORMAL LOW (ref 36.0–46.0)
Hemoglobin: 8.8 g/dL — ABNORMAL LOW (ref 12.0–15.0)
MCHC: 31.7 g/dL (ref 30.0–36.0)
RBC: 3.03 MIL/uL — ABNORMAL LOW (ref 3.87–5.11)
WBC: 10.8 10*3/uL — ABNORMAL HIGH (ref 4.0–10.5)

## 2012-03-22 MED ORDER — TRACE MINERALS CR-CU-F-FE-I-MN-MO-SE-ZN IV SOLN
INTRAVENOUS | Status: AC
  Administered 2012-03-22: 17:00:00 via INTRAVENOUS
  Filled 2012-03-22: qty 2000

## 2012-03-22 MED ORDER — FAT EMULSION 20 % IV EMUL
240.0000 mL | INTRAVENOUS | Status: AC
  Administered 2012-03-22: 240 mL via INTRAVENOUS
  Filled 2012-03-22: qty 250

## 2012-03-22 NOTE — Progress Notes (Signed)
8 Days Post-Op  Subjective: Pt still with pain especially below ostomy in LLQ.  Pt denies N/V, but appetite low.  Tolerating some full liquids.  Very little in ostomy as far as BM or gas.  Pt ambulating very little secondary to pain.  Objective: Vital signs in last 24 hours: Temp:  [98.2 F (36.8 C)-98.7 F (37.1 C)] 98.7 F (37.1 C) (03/21 0452) Pulse Rate:  [68-84] 84 (03/21 0624) Resp:  [18-20] 18 (03/21 0452) BP: (115-149)/(51-73) 149/73 mmHg (03/21 0624) SpO2:  [99 %-100 %] 99 % (03/21 0816) Last BM Date: 03/21/12  Intake/Output from previous day: 03/20 0701 - 03/21 0700 In: 2598.7 [I.V.:498.7; IV Piggyback:100; TPN:2000] Out: 1600 [Urine:1600] Intake/Output this shift: Total I/O In: -  Out: 150 [Urine:150]  PE: Gen:  Alert, NAD, pleasant Abd: Soft, tenderness in LLQ below ostomy otherwise no pain, ND, +BS, no HSM, midline wound C/D/I   Lab Results:   Recent Labs  03/21/12 0420 03/22/12 0524  WBC 13.7* 10.8*  HGB 9.1* 8.8*  HCT 28.6* 27.8*  PLT 505* 454*   BMET  Recent Labs  03/21/12 0420 03/22/12 0524  NA 139 137  K 4.7 4.3  CL 104 103  CO2 28 29  GLUCOSE 85 114*  BUN 34* 33*  CREATININE 0.88 0.83  CALCIUM 9.5 9.2   PT/INR No results found for this basename: LABPROT, INR,  in the last 72 hours CMP     Component Value Date/Time   NA 137 03/22/2012 0524   NA 139 11/27/2011 1533   K 4.3 03/22/2012 0524   K 4.1 11/27/2011 1533   CL 103 03/22/2012 0524   CL 100 11/27/2011 1533   CO2 29 03/22/2012 0524   CO2 31* 11/27/2011 1533   GLUCOSE 114* 03/22/2012 0524   GLUCOSE 97 11/27/2011 1533   BUN 33* 03/22/2012 0524   BUN 22.0 11/27/2011 1533   CREATININE 0.83 03/22/2012 0524   CREATININE 0.9 11/27/2011 1533   CALCIUM 9.2 03/22/2012 0524   CALCIUM 10.0 11/27/2011 1533   PROT 5.6* 03/22/2012 0524   PROT 7.1 11/27/2011 1533   ALBUMIN 2.1* 03/22/2012 0524   ALBUMIN 3.4* 11/27/2011 1533   AST 31 03/22/2012 0524   AST 16 11/27/2011 1533   ALT 44*  03/22/2012 0524   ALT 11 11/27/2011 1533   ALKPHOS 116 03/22/2012 0524   ALKPHOS 73 11/27/2011 1533   BILITOT 0.2* 03/22/2012 0524   BILITOT 0.34 11/27/2011 1533   GFRNONAA 64* 03/22/2012 0524   GFRAA 74* 03/22/2012 0524   Lipase     Component Value Date/Time   LIPASE 8* 11/08/2011 1539       Studies/Results: No results found.  Anti-infectives: Anti-infectives   Start     Dose/Rate Route Frequency Ordered Stop   03/12/12 1000  metroNIDAZOLE (FLAGYL) IVPB 500 mg  Status:  Discontinued     500 mg 100 mL/hr over 60 Minutes Intravenous 3 times per day 03/12/12 0908 03/15/12 0807   03/10/12 1000  piperacillin-tazobactam (ZOSYN) IVPB 3.375 g  Status:  Discontinued     3.375 g 12.5 mL/hr over 240 Minutes Intravenous Every 8 hours 03/10/12 0851 03/16/12 0823   03/10/12 0815  piperacillin-tazobactam (ZOSYN) IVPB 3.375 g  Status:  Discontinued     3.375 g 12.5 mL/hr over 240 Minutes Intravenous Every 8 hours 03/10/12 0807 03/10/12 0851   03/10/12 0815  fluconazole (DIFLUCAN) IVPB 200 mg  Status:  Discontinued     200 mg 100 mL/hr over 60 Minutes Intravenous Every  24 hours 03/10/12 0807 03/16/12 0823   03/06/12 1200  ertapenem (INVANZ) 1 g in sodium chloride 0.9 % 50 mL IVPB  Status:  Discontinued     1 g 100 mL/hr over 30 Minutes Intravenous Every 24 hours 03/06/12 1059 03/10/12 0807   03/03/12 1200  cefTRIAXone (ROCEPHIN) 1 g in dextrose 5 % 50 mL IVPB  Status:  Discontinued     1 g 100 mL/hr over 30 Minutes Intravenous Every 24 hours 03/03/12 0958 03/06/12 1059   02/29/12 2000  metroNIDAZOLE (FLAGYL) IVPB 500 mg  Status:  Discontinued     500 mg 100 mL/hr over 60 Minutes Intravenous Every 8 hours 02/29/12 1919 03/06/12 1059   02/29/12 2000  ciprofloxacin (CIPRO) IVPB 400 mg  Status:  Discontinued     400 mg 200 mL/hr over 60 Minutes Intravenous Every 12 hours 02/29/12 1925 03/03/12 0958   02/29/12 1745  cefTRIAXone (ROCEPHIN) 1 g in dextrose 5 % 50 mL IVPB     1 g 100 mL/hr over  30 Minutes Intravenous  Once 02/29/12 1744 02/29/12 1859   02/29/12 1745  metroNIDAZOLE (FLAGYL) IVPB 500 mg  Status:  Discontinued     500 mg 100 mL/hr over 60 Minutes Intravenous  Once 02/29/12 1744 02/29/12 1929   02/29/12 1745  ciprofloxacin (CIPRO) IVPB 400 mg  Status:  Discontinued     400 mg 200 mL/hr over 60 Minutes Intravenous  Once 02/29/12 1744 02/29/12 1928       Assessment/Plan POD #8 Status post Hartmann's resection (left colectomy and colostomy) for acute sigmoid diverticulitis 1.  Ileus resolving - very small amount of stool in ostomy so far 2.  Stay with fulls for now since output is minimal and pt is not hungry, advance as soon as output is better 3.  Encouraged OOB, ambulation - needs to ambulate in order to help pain decrease!!!  Told her not to refuse PT/OT services 4.  Disposition? - will probably need SNF consult - per medicine service  Chronic medical problems - per medicine service    LOS: 22 days    DORT, Stanton Kissoon 03/22/2012, 10:06 AM Pager: [phone]

## 2012-03-22 NOTE — Progress Notes (Addendum)
PARENTERAL NUTRITION CONSULT NOTE - FOLLOW UP  Pharmacy Consult for TNA Indication: Intolerance to enteral feeding s/p sigmoid colon resection 3/13 for diverticulitis  Allergies  Allergen Reactions  . Sulfonamide Derivatives Swelling    Patient Measurements: Height: 5' (152.4 cm) Weight: 141 lb 4.8 oz (64.093 kg) IBW/kg (Calculated) : 45.5 Usual Weight: 65 kg  Vital Signs: Temp: 98.7 F (37.1 C) (03/21 0452) Temp src: Oral (03/21 0452) BP: 149/73 mmHg (03/21 0624) Pulse Rate: 84 (03/21 0624) Intake/Output from previous day: 03/20 0701 - 03/21 0700 In: 2598.7 [I.V.:498.7; IV Piggyback:100; TPN:2000] Out: 1600 [Urine:1600] Intake/Output from this shift:    Labs:  Recent Labs  03/20/12 0400 03/21/12 0420 03/22/12 0524  WBC 11.3* 13.7* 10.8*  HGB 9.0* 9.1* 8.8*  HCT 27.9* 28.6* 27.8*  PLT 481* 505* 454*     Recent Labs  03/20/12 0400 03/21/12 0420 03/22/12 0524  NA 137 139 137  K 4.2 4.7 4.3  CL 103 104 103  CO2 29 28 29   GLUCOSE 108* 85 114*  BUN 35* 34* 33*  CREATININE 0.81 0.88 0.83  CALCIUM 9.5 9.5 9.2  MG  --  2.1  --   PHOS  --  3.2  --   PROT 5.7* 5.8* 5.6*  ALBUMIN 2.1* 2.2* 2.1*  AST 29 46* 31  ALT 33 51* 44*  ALKPHOS 130* 129* 116  BILITOT 0.3 0.2* 0.2*   Estimated Creatinine Clearance: 43.6 ml/min (by C-G formula based on Cr of 0.83).    Recent Labs  03/21/12 1808 03/22/12 0035 03/22/12 0600  GLUCAP 110* 134* 129*    Nutritional Goals:  - RD recs per day: 1420-1660 Kcal, 80-94 g protein, 2L fluid - Clinimix E5/15 at 34ml/h + lipids 34ml/hr MWF will provide 90gm protein and weekly avg of 1484 kcals (1278kcal TTSS and 1758 kcals MWF).   Current nutrition:  - Diet: Advanced to FLD on 3/20 - 0% documented by RN - will f/u with patient regarding intake and tolerance of FLD.  - TNA: Clinimix E 5/15 @ goal rate of 75 ml/hr - mIVF: NS at [facility]  CBGs & Insulin requirements past 24 hours:  - CBGs within goal of 150mg /dl with the  addition of insulin to TNA (~ 14 units/24 hours) - 6 units SSI insulin  Labs: Electrolytes: WNL Renal Function: Scr = 0.81 (on lasix 20mg  IV daily) Hepatic Function: AST/ALT just slightly elevated, improved from yesterday, Alk phos now WNL Pre-Albumin: 7.9 (3/5), 12.7 (3/10), 26.7 (3/17) Triglycerides: 80 (3/5), 79 (3/10), 171 (3/17)  CBGs: within goal range <  150mg /dl   Assessment:  51 yoF with h/o diveriticulitis presented 2/27 with abd pain, N/V and fatigue. CT abd/pelvis in ED with worsened chronic inflammatory process involving the sigmoid colon. Pt admitted for acute on chronic sigmoiditis, diverticulitis, and UTI. GI on board, pt did not improve with Cipro/Flagyl. Repeat CT 3/3 with chronic sigmoid colon thickening, small area of air either contained within diverticulum or escaped from bowel.  TNA ordered to start 3/4 given intolerance to enteral feeding. S/p sigmoid colectomy and colostomy 3/13.  Now with post-op ileus.  Today is Day #17 TNA. Clinimix formulation changed to E5/15 3/17 pm to reduce dextrose, infusing at goal rate of 14ml/hr.  Patient appears to be tolerating TNA at goal rate. Patient advanced to FLD on 3/20 - documented as 0% meals eaten, will f/u with patient today to assess tolerance and amount of FLD intake.   Plan:   Continue Clinimix E 5/15 at goal  rate of 75 ml/hr.  TNA to contain IV fat emulsion 20% at 10 ml/hr on MWF only due to ongoing shortage  Continue insulin 8 units/L in TNA (will receive ~14 units/24hr)  Continue SSI coverage moderate scale q6h  Standard multivitamins and trace elements on MWF only due to ongoing shortage. Patient not taking anything by mouth so unable to convert MVI to PO at this time. TNA labs Monday/Thursdays  Clydene Fake PharmD Pager #: [phone] 7:17 AM 03/22/2012

## 2012-03-22 NOTE — Progress Notes (Signed)
Pt has a BP of 120/57 and a HR of 73.  Notified MD to see if 10mg  of Metoprolol was appropriated.  Received new orders.  Administered only 5mg  of Metoprolol as ordered.  Will continue to monitor pt. Katie Galvan 1:20 AM 03/22/2012

## 2012-03-22 NOTE — Progress Notes (Signed)
General [facility]  Slow progress.  Agree with note today by Aris [place].  Velora Heckler, MD, [facility] Office: [phone]

## 2012-03-22 NOTE — Progress Notes (Signed)
Patient ID: Katie Galvan, female   DOB: 1927/11/20, 79 y.o.   MRN: [id number]  [facility] PROGRESS NOTE  Siriyah Ambrosius WUJ:[id number] DOB: 27-Nov-1927 DOA: 02/29/2012 PCP: Michele Mcalpine, MD  Brief narrative:  Pt is 79 year old female with complex medical history including diverticulitis, ischemic cardiomyopathy (EF 30%), perimyocarditis, COPD, endocarditis, recurrent falls, who presented to [facility] ED 02/29/2012 due to abdominal pain, nausea and vomiting. In ED, CT abd/pelvis consistent with sigmoid diverticulitis worse compared to previous imaging studies. Surgery and GI following. Pt is now s/p L colectomy and colostomy on 3/13 and with subsequent development of post op ileus.   Assessment and Plan:  Principal Problem:  *Acute Sigmoid Diverticulitis  Status post left colectomy and colostomy placement, post op day #8  Advanced to full liquid diet, OOB and ambulation, emphasized importance of continuing physical therapy  Hyperkalemia: corrected  Stable, BMP in AM Hyperglycemia  Stable, continue SSI, HBAIC is 5.4  UTI (lower urinary tract infection)  Secondary to E.Coli, Patient completed 5 days of ceftriaxone.  Chronic respiratory failure with hypoxia /COPD  Stable, pt maintaining oxygen saturations at target range, continue symbicort, Albuterol Q 6 hours PRN, Incentive spirometry Q1H  Chronic Diastolic CHF :  Restart BB once PO intake resumed, for now continue IV, renal function stable  Lasix 20mg  daily, Follow I/Os Bilateral lower extremity ulcers  Stable, daily dressing changes Moderate protein calorie malnutrition  On TNA, full liquids continue today Anemia of chronic disease  Hemoglobin and hematocrit stable and at pt's baseline   Consultants:  Dr. Arlyce Dice ( GI)  Dr. Ezzard Standing (Surgery)  Dr. Molli Knock (Pulm)  Dr. Excell Seltzer (Cardiology) Procedures:  Left colectomy and colostomy 03/14/2012 by Dr. Carolynne Edouard  Antibiotics:  Fluconazole 03/10/2012 -->3/15  Zosyn 03/10/2012 -->3/15  Flagyl  03/12/2012 -->3/14  Code Status: DNR  Family Communication: Pt and son at bedside  Disposition Plan: SNF when stable  HPI/Subjective: No events overnight.   Objective: Filed Vitals:   03/21/12 2104 03/22/12 0452 03/22/12 0624 03/22/12 0816  BP: 115/59 128/51 149/73   Pulse: 72 68 84   Temp: 98.6 F (37 C) 98.7 F (37.1 C)    TempSrc: Oral Oral    Resp: 18 18    Height:      Weight:      SpO2: 100% 100%  99%    Intake/Output Summary (Last 24 hours) at 03/22/12 1121 Last data filed at 03/22/12 0825  Gross per 24 hour  Intake 2598.67 ml  Output   1050 ml  Net 1548.67 ml    Exam:   General:  Pt is alert, follows commands appropriately, not in acute distress, fragile and chronically ill appearing   Cardiovascular: Regular rate and rhythm, S1/S2, no murmurs, no rubs, no gallops  Respiratory: Clear to auscultation bilaterally, no wheezing, no crackles, no rhonchi, decreased breath sounds at bases   Abdomen: Soft, slightly tender in lower quadrants, non distended, bowel sounds present, no guarding  Extremities: No edema, pulses DP and PT palpable bilaterally  Neuro: Grossly nonfocal  Data Reviewed: Basic Metabolic Panel:  Recent Labs Lab 03/18/12 0400 03/19/12 0530 03/20/12 0400 03/21/12 0420 03/22/12 0524  NA 135 137 137 139 137  K 4.7 4.5 4.2 4.7 4.3  CL 102 102 103 104 103  CO2 29 31 29 28 29   GLUCOSE 137* 119* 108* 85 114*  BUN 46* 35* 35* 34* 33*  CREATININE 0.94 0.78 0.81 0.88 0.83  CALCIUM 9.3 9.5 9.5 9.5 9.2  MG 2.0  --   --  2.1  --   PHOS 2.9  --   --  3.2  --    Liver Function Tests:  Recent Labs Lab 03/18/12 0400 03/19/12 0530 03/20/12 0400 03/21/12 0420 03/22/12 0524  AST 14 14 29  46* 31  ALT 15 14 33 51* 44*  ALKPHOS 75 93 130* 129* 116  BILITOT 0.2* 0.3 0.3 0.2* 0.2*  PROT 5.4* 5.9* 5.7* 5.8* 5.6*  ALBUMIN 2.0* 2.2* 2.1* 2.2* 2.1*   CBC:  Recent Labs Lab 03/18/12 0400 03/19/12 0530 03/20/12 0400 03/21/12 0420  03/22/12 0524  WBC 10.1 12.3* 11.3* 13.7* 10.8*  NEUTROABS 7.3  --   --   --   --   HGB 8.4* 9.6* 9.0* 9.1* 8.8*  HCT 26.6* 29.9* 27.9* 28.6* 27.8*  MCV 91.7 91.2 91.5 92.6 91.7  PLT 458* 520* 481* 505* 454*   CBG:  Recent Labs Lab 03/21/12 0631 03/21/12 1158 03/21/12 1808 03/22/12 0035 03/22/12 0600  GLUCAP 121* 140* 110* 134* 129*    Recent Results (from the past 240 hour(s))  MRSA PCR SCREENING     Status: None   Collection Time    03/14/12  5:55 PM      Result Value Range Status   MRSA by PCR NEGATIVE  NEGATIVE Final   Comment:            The GeneXpert MRSA Assay (FDA     approved for NASAL specimens     only), is one component of a     comprehensive MRSA colonization     surveillance program. It is not     intended to diagnose MRSA     infection nor to guide or     monitor treatment for     MRSA infections.     Scheduled Meds: . budesonide-formoterol  1 puff Inhalation BID  . enoxaparin  injection  40 mg Subcutaneous Q24H  . furosemide  20 mg Intravenous Daily  . insulin aspart  0-15 Units Subcutaneous Q6H  . metoprolol  10 mg Intravenous Q6H  . ondansetron  IV  8 mg Intravenous Q6H  . pantoprazole IV  40 mg Intravenous Q24H  . tiotropium  18 mcg Inhalation Daily   Continuous Infusions: . sodium chloride 20 mL/hr at 03/21/12 2025  . TPN (CLINIMIX) +/- additives 75 mL/hr at 03/21/12 1754  . TPN [facility] +/- additives     Debbora Presto, MD  [facility] Pager [phone]  If 7PM-7AM, please contact night-coverage [email or web address] Password [email or web address] 03/22/2012, 11:21 AM   LOS: 22 days

## 2012-03-23 LAB — CBC
HCT: 27.3 % — ABNORMAL LOW (ref 36.0–46.0)
MCH: 28.9 pg (ref 26.0–34.0)
MCV: 91.6 fL (ref 78.0–100.0)
Platelets: 453 10*3/uL — ABNORMAL HIGH (ref 150–400)
RDW: 18.2 % — ABNORMAL HIGH (ref 11.5–15.5)
WBC: 10 10*3/uL (ref 4.0–10.5)

## 2012-03-23 LAB — COMPREHENSIVE METABOLIC PANEL
ALT: 125 U/L — ABNORMAL HIGH (ref 0–35)
AST: 97 U/L — ABNORMAL HIGH (ref 0–37)
Albumin: 2.2 g/dL — ABNORMAL LOW (ref 3.5–5.2)
Alkaline Phosphatase: 146 U/L — ABNORMAL HIGH (ref 39–117)
Chloride: 99 mEq/L (ref 96–112)
Potassium: 4.1 mEq/L (ref 3.5–5.1)
Sodium: 134 mEq/L — ABNORMAL LOW (ref 135–145)
Total Bilirubin: 0.2 mg/dL — ABNORMAL LOW (ref 0.3–1.2)
Total Protein: 5.7 g/dL — ABNORMAL LOW (ref 6.0–8.3)

## 2012-03-23 LAB — GLUCOSE, CAPILLARY
Glucose-Capillary: 166 mg/dL — ABNORMAL HIGH (ref 70–99)
Glucose-Capillary: 119 mg/dL — ABNORMAL HIGH (ref 70–99)

## 2012-03-23 MED ORDER — INSULIN REGULAR HUMAN 100 UNIT/ML IJ SOLN
INTRAVENOUS | Status: AC
  Administered 2012-03-23: 18:00:00 via INTRAVENOUS
  Filled 2012-03-23: qty 2000

## 2012-03-23 NOTE — Progress Notes (Signed)
9 Days Post-Op  Subjective: tol fulls, no n/v, states she was up yesterday  Objective: Vital signs in last 24 hours: Temp:  [97.6 F (36.4 C)-98.4 F (36.9 C)] 97.6 F (36.4 C) (03/22 0526) Pulse Rate:  [79-100] 79 (03/22 0526) Resp:  [16-20] 16 (03/22 0526) BP: (109-136)/(53-87) 112/53 mmHg (03/22 0526) SpO2:  [93 %-100 %] 99 % (03/22 0936) Weight:  [134 lb 0.6 oz (60.8 kg)] 134 lb 0.6 oz (60.8 kg) (03/22 0526) Last BM Date: 03/22/12  Intake/Output from previous day: 03/21 0701 - 03/22 0700 In: 2687.5 [P.O.:180; I.V.:730; IV Piggyback:200; TPN:1577.5] Out: 875 [Urine:875] Intake/Output this shift: Total I/O In: -  Out: 275 [Urine:275]  General appearance: no distress GI: incision clean without infection, stoma pink with small amt gas, approp tender, few bs  Lab Results:   Recent Labs  03/22/12 0524 03/23/12 0500  WBC 10.8* 10.0  HGB 8.8* 8.6*  HCT 27.8* 27.3*  PLT 454* 453*   BMET  Recent Labs  03/22/12 0524 03/23/12 0500  NA 137 134*  K 4.3 4.1  CL 103 99  CO2 29 26  GLUCOSE 114* 143*  BUN 33* 35*  CREATININE 0.83 0.79  CALCIUM 9.2 9.2   PT/INR No results found for this basename: LABPROT, INR,  in the last 72 hours ABG No results found for this basename: PHART, PCO2, PO2, HCO3,  in the last 72 hours  Studies/Results: No results found.  Anti-infectives: Anti-infectives   Start     Dose/Rate Route Frequency Ordered Stop   03/12/12 1000  metroNIDAZOLE (FLAGYL) IVPB 500 mg  Status:  Discontinued     500 mg 100 mL/hr over 60 Minutes Intravenous 3 times per day 03/12/12 0908 03/15/12 0807   03/10/12 1000  piperacillin-tazobactam (ZOSYN) IVPB 3.375 g  Status:  Discontinued     3.375 g 12.5 mL/hr over 240 Minutes Intravenous Every 8 hours 03/10/12 0851 03/16/12 0823   03/10/12 0815  piperacillin-tazobactam (ZOSYN) IVPB 3.375 g  Status:  Discontinued     3.375 g 12.5 mL/hr over 240 Minutes Intravenous Every 8 hours 03/10/12 0807 03/10/12 0851   03/10/12 0815  fluconazole (DIFLUCAN) IVPB 200 mg  Status:  Discontinued     200 mg 100 mL/hr over 60 Minutes Intravenous Every 24 hours 03/10/12 0807 03/16/12 0823   03/06/12 1200  ertapenem (INVANZ) 1 g in sodium chloride 0.9 % 50 mL IVPB  Status:  Discontinued     1 g 100 mL/hr over 30 Minutes Intravenous Every 24 hours 03/06/12 1059 03/10/12 0807   03/03/12 1200  cefTRIAXone (ROCEPHIN) 1 g in dextrose 5 % 50 mL IVPB  Status:  Discontinued     1 g 100 mL/hr over 30 Minutes Intravenous Every 24 hours 03/03/12 0958 03/06/12 1059   02/29/12 2000  metroNIDAZOLE (FLAGYL) IVPB 500 mg  Status:  Discontinued     500 mg 100 mL/hr over 60 Minutes Intravenous Every 8 hours 02/29/12 1919 03/06/12 1059   02/29/12 2000  ciprofloxacin (CIPRO) IVPB 400 mg  Status:  Discontinued     400 mg 200 mL/hr over 60 Minutes Intravenous Every 12 hours 02/29/12 1925 03/03/12 0958   02/29/12 1745  cefTRIAXone (ROCEPHIN) 1 g in dextrose 5 % 50 mL IVPB     1 g 100 mL/hr over 30 Minutes Intravenous  Once 02/29/12 1744 02/29/12 1859   02/29/12 1745  metroNIDAZOLE (FLAGYL) IVPB 500 mg  Status:  Discontinued     500 mg 100 mL/hr over 60 Minutes Intravenous  Once 02/29/12 1744 02/29/12 1929   02/29/12 1745  ciprofloxacin (CIPRO) IVPB 400 mg  Status:  Discontinued     400 mg 200 mL/hr over 60 Minutes Intravenous  Once 02/29/12 1744 02/29/12 1928      Assessment/Plan: POD #9 Status post Hartmann's resection (left colectomy and colostomy) for acute sigmoid diverticulitis  1. Ileus resolving - very small amount of stool in ostomy so far, small amt gas also 2. Stay with fulls for now since output is minimal and pt is not hungry, advance as soon as output is better and stoma working more 3. Encouraged OOB, ambulation again 4. Disposition? - will probably need SNF consult - per medicine service 5. She can likely have abx stopped now >7 days postop with no fevers/wbc normal   [facility] 03/23/2012

## 2012-03-23 NOTE — Progress Notes (Signed)
Patient ID: Katie Galvan, female   DOB: June 04, 1927, 79 y.o.   MRN: [id number]  [facility] PROGRESS NOTE  [name] NFA:[id number] DOB: June 25, 1927 DOA: 02/29/2012 PCP: Michele Mcalpine, MD  Brief narrative:  Pt is 79 year old female with complex medical history including diverticulitis, ischemic cardiomyopathy (EF 30%), perimyocarditis, COPD, endocarditis, recurrent falls, who presented to [facility] ED 02/29/2012 due to abdominal pain, nausea and vomiting. In ED, CT abd/pelvis consistent with sigmoid diverticulitis worse compared to previous imaging studies. Surgery and GI following. Pt is now s/p L colectomy and colostomy on 3/13 and with subsequent development of post op ileus.   Assessment and Plan:  Principal Problem:  *Acute Sigmoid Diverticulitis  Status post left colectomy and colostomy placement, post op day #9 Advanced to full liquid diet, OOB and ambulation, emphasized importance of continuing physical therapy  Hyperkalemia: corrected  Stable, BMP in AM Hyperglycemia  Stable, continue SSI, HBAIC is 5.4  UTI (lower urinary tract infection)  Secondary to E.Coli, Patient completed 5 days of ceftriaxone.  Chronic respiratory failure with hypoxia /COPD  Stable, pt maintaining oxygen saturations at target range, continue symbicort, Albuterol Q 6 hours PRN, Incentive spirometry Q1H  Chronic Diastolic CHF :  Restart BB once PO intake resumed, for now continue IV, renal function stable  Lasix 20mg  daily, Follow I/Os Bilateral lower extremity ulcers  Stable, daily dressing changes Moderate protein calorie malnutrition  On TNA, full liquids continue today Anemia of chronic disease   Hemoglobin and hematocrit stable and at pt's baseline   Consultants:  Dr. Arlyce Dice (GI) --> signed off Dr. Ezzard Standing (Surgery)  Dr. Molli Knock [facility]) --> signed off Dr. Excell Seltzer (Cardiology) --> signed off Procedures:  Left colectomy and colostomy 03/14/2012 by Dr. Carolynne Edouard  Antibiotics:  Fluconazole 03/10/2012  -->3/15  Zosyn 03/10/2012 -->3/15  Flagyl 03/12/2012 -->3/14  Code Status: DNR  Family Communication: Pt, no  son at bedside  Disposition Plan: SNF  HPI/Subjective: No events overnight.   Objective: Filed Vitals:   03/22/12 2134 03/23/12 0005 03/23/12 0526 03/23/12 0936  BP:  120/66 112/53   Pulse:  79 79   Temp:   97.6 F (36.4 C)   TempSrc:   Oral   Resp:   16   Height:      Weight:   60.8 kg (134 lb 0.6 oz)   SpO2: 93%  99% 99%    Intake/Output Summary (Last 24 hours) at 03/23/12 1058 Last data filed at 03/23/12 0704  Gross per 24 hour  Intake 2687.5 ml  Output   1000 ml  Net 1687.5 ml    Exam:   General:  Pt is alert, follows commands appropriately, not in acute distress  Cardiovascular: Regular rate and rhythm, S1/S2, no murmurs, no rubs, no gallops  Respiratory: Clear to auscultation bilaterally, no wheezing, no crackles, no rhonchi  Abdomen: Soft, tender in epigastric area, non distended, ostomy in place   Extremities: No edema, pulses DP and PT palpable bilaterally  Neuro: Grossly nonfocal  Data Reviewed: Basic Metabolic Panel:  Recent Labs Lab 03/18/12 0400 03/19/12 0530 03/20/12 0400 03/21/12 0420 03/22/12 0524 03/23/12 0500  NA 135 137 137 139 137 134*  K 4.7 4.5 4.2 4.7 4.3 4.1  CL 102 102 103 104 103 99  CO2 29 31 29 28 29 26   GLUCOSE 137* 119* 108* 85 114* 143*  BUN 46* 35* 35* 34* 33* 35*  CREATININE 0.94 0.78 0.81 0.88 0.83 0.79  CALCIUM 9.3 9.5 9.5 9.5 9.2 9.2  MG 2.0  --   --  2.1  --   --   PHOS 2.9  --   --  3.2  --   --    Liver Function Tests:  Recent Labs Lab 03/19/12 0530 03/20/12 0400 03/21/12 0420 03/22/12 0524 03/23/12 0500  AST 14 29 46* 31 97*  ALT 14 33 51* 44* 125*  ALKPHOS 93 130* 129* 116 146*  BILITOT 0.3 0.3 0.2* 0.2* 0.2*  PROT 5.9* 5.7* 5.8* 5.6* 5.7*  ALBUMIN 2.2* 2.1* 2.2* 2.1* 2.2*   CBC:  Recent Labs Lab 03/18/12 0400 03/19/12 0530 03/20/12 0400 03/21/12 0420 03/22/12 0524  03/23/12 0500  WBC 10.1 12.3* 11.3* 13.7* 10.8* 10.0  NEUTROABS 7.3  --   --   --   --   --   HGB 8.4* 9.6* 9.0* 9.1* 8.8* 8.6*  HCT 26.6* 29.9* 27.9* 28.6* 27.8* 27.3*  MCV 91.7 91.2 91.5 92.6 91.7 91.6  PLT 458* 520* 481* 505* 454* 453*   CBG:  Recent Labs Lab 03/22/12 0600 03/22/12 1144 03/22/12 1729 03/22/12 2354 03/23/12 0545  GLUCAP 129* 139* 157* 144* 166*    Recent Results (from the past 240 hour(s))  MRSA PCR SCREENING     Status: None   Collection Time    03/14/12  5:55 PM      Result Value Range Status   MRSA by PCR NEGATIVE  NEGATIVE Final   Comment:            The GeneXpert MRSA Assay (FDA     approved for NASAL specimens     only), is one component of a     comprehensive MRSA colonization     surveillance program. It is not     intended to diagnose MRSA     infection nor to guide or     monitor treatment for     MRSA infections.     Scheduled Meds: . antiseptic oral rinse  15 mL Mouth Rinse q12n4p  . budesonide-formoterol  1 puff Inhalation BID  . chlorhexidine  15 mL Mouth Rinse BID  . enoxaparin (LOVENOX) injection  40 mg Subcutaneous Q24H  . furosemide  20 mg Intravenous Daily  . insulin aspart  0-15 Units Subcutaneous Q6H  . metoprolol  10 mg Intravenous Q6H  . ondansetron (ZOFRAN) IV  8 mg Intravenous Q6H  . pantoprazole (PROTONIX) IV  40 mg Intravenous Q24H  . tiotropium  18 mcg Inhalation Daily   Continuous Infusions: . sodium chloride 20 mL/hr at 03/23/12 0243  . fat emulsion 240 mL (03/22/12 1720)  . TPN (CLINIMIX) +/- additives 75 mL/hr at 03/23/12 0749  . TPN [facility]) +/- additives       Debbora Presto, MD  [facility] Pager [phone]  If 7PM-7AM, please contact night-coverage [email or web address] Password [facility] 03/23/2012, 10:58 AM   LOS: 23 days

## 2012-03-23 NOTE — Progress Notes (Signed)
PARENTERAL NUTRITION CONSULT NOTE - FOLLOW UP  Pharmacy Consult for TNA Indication: Intolerance to enteral feeding s/p sigmoid colon resection 3/13 for diverticulitis  Allergies  Allergen Reactions  . Sulfonamide Derivatives Swelling    Patient Measurements: Height: 5' (152.4 cm) Weight: 134 lb 0.6 oz (60.8 kg) IBW/kg (Calculated) : 45.5 Usual Weight: 65 kg  Vital Signs: Temp: 97.6 F (36.4 C) (03/22 0526) Temp src: Oral (03/22 0526) BP: 112/53 mmHg (03/22 0526) Pulse Rate: 79 (03/22 0526)  Intake/Output from previous day: 03/21 0701 - 03/22 0700 In: 2687.5 [P.O.:180; I.V.:730; IV Piggyback:200; TPN:1577.5] Out: 875 [Urine:875]  Labs:  Recent Labs  03/21/12 0420 03/22/12 0524 03/23/12 0500  WBC 13.7* 10.8* 10.0  HGB 9.1* 8.8* 8.6*  HCT 28.6* 27.8* 27.3*  PLT 505* 454* 453*     Recent Labs  03/21/12 0420 03/22/12 0524 03/23/12 0500  NA 139 137 134*  K 4.7 4.3 4.1  CL 104 103 99  CO2 28 29 26   GLUCOSE 85 114* 143*  BUN 34* 33* 35*  CREATININE 0.88 0.83 0.79  CALCIUM 9.5 9.2 9.2  MG 2.1  --   --   PHOS 3.2  --   --   PROT 5.8* 5.6* 5.7*  ALBUMIN 2.2* 2.1* 2.2*  AST 46* 31 97*  ALT 51* 44* 125*  ALKPHOS 129* 116 146*  BILITOT 0.2* 0.2* 0.2*   Estimated Creatinine Clearance: 44.2 ml/min (by C-G formula based on Cr of 0.79).    Recent Labs  03/22/12 1144 03/22/12 1729 03/22/12 2354  GLUCAP 139* 157* 144*    Nutritional Goals:  - RD recs per day: 1420-1660 Kcal, 80-94 g protein, 2L fluid - Clinimix E5/15 at 54ml/h + lipids 64ml/hr MWF will provide 90gm protein and weekly avg of 1484 kcals (1278kcal TTSS and 1758 kcals MWF).   Current nutrition:  - Diet: Advanced to FLD on 3/20  - TNA: Clinimix E 5/15 @ goal rate of 75 ml/hr - mIVF: NS at [facility]  CBGs & Insulin requirements past 24 hours:  - CBGs essentially within goal of 150mg /dl with the addition of insulin to TNA (~ 14 units/24 hours) - 10 units SSI insulin given outside  TNA  Labs: Electrolytes: WNL, except Na slightly low (unable to change TNA content) Renal Function: Scr wnl Hepatic Function: AST/ALT, Alk phos elevated, trending up Pre-Albumin: 7.9 (3/5), 12.7 (3/10), 26.7 (3/17) Triglycerides: 80 (3/5), 79 (3/10), 171 (3/17)  CBGs: essentially within goal range <  150mg /dl   Assessment:  50 yoF with h/o diveriticulitis presented 2/27 with abd pain, N/V and fatigue. CT abd/pelvis in ED with worsened chronic inflammatory process involving the sigmoid colon. Pt admitted for acute on chronic sigmoiditis, diverticulitis, and UTI. GI on board, pt did not improve with Cipro/Flagyl. Repeat CT 3/3 with chronic sigmoid colon thickening, small area of air either contained within diverticulum or escaped from bowel.  TNA ordered to start 3/4 given intolerance to enteral feeding. S/p sigmoid colectomy and colostomy 3/13.  Now with post-op ileus.  Today is Day #18 TNA. Clinimix formulation changed to E5/15 3/17 pm to reduce dextrose, infusing at goal rate of 53ml/hr.  Diet advanced to FLD on 3/20 - apparently tolerating some PO, but reports not being hungry. LFTs are elevated and trending up.   Plan:   Decrease Clinimix E 5/15 to 60 ml/hr (~80% support) due to rising LFTs and to encourage PO intake.  TNA to contain IV fat emulsion 20% at 10 ml/hr on MWF only due to ongoing  shortage  Continue insulin 8 units/L in TNA (will receive ~11 units/24hr with new, reduced rate)  Continue SSI coverage moderate scale q6h  Standard multivitamins and trace elements on MWF only due to ongoing shortage. Patient not taking other meds by mouth so unable to convert MVI to PO at this time. TNA labs Monday/Thursdays - watch LFTs Watch CBGs with rate change and PO intake  Darrol Angel, PharmD Pager: [phone] 7:34 AM 03/23/2012

## 2012-03-24 LAB — GLUCOSE, CAPILLARY
Glucose-Capillary: 136 mg/dL — ABNORMAL HIGH (ref 70–99)
Glucose-Capillary: 133 mg/dL — ABNORMAL HIGH (ref 70–99)

## 2012-03-24 LAB — COMPREHENSIVE METABOLIC PANEL
ALT: 79 U/L — ABNORMAL HIGH (ref 0–35)
Alkaline Phosphatase: 130 U/L — ABNORMAL HIGH (ref 39–117)
BUN: 32 mg/dL — ABNORMAL HIGH (ref 6–23)
CO2: 29 mEq/L (ref 19–32)
Calcium: 9.2 mg/dL (ref 8.4–10.5)
GFR calc Af Amer: 90 mL/min — ABNORMAL LOW (ref >90)
GFR calc non Af Amer: 77 mL/min — ABNORMAL LOW (ref >90)
Glucose, Bld: 124 mg/dL — ABNORMAL HIGH (ref 70–99)
Potassium: 4.2 mEq/L (ref 3.5–5.1)
Sodium: 137 mEq/L (ref 135–145)

## 2012-03-24 LAB — CBC
MCHC: 32.4 g/dL (ref 30.0–36.0)
RDW: 17.9 % — ABNORMAL HIGH (ref 11.5–15.5)
WBC: 9.3 10*3/uL (ref 4.0–10.5)

## 2012-03-24 MED ORDER — POLYETHYLENE GLYCOL 3350 17 G PO PACK
17.0000 g | PACK | Freq: Once | ORAL | Status: AC
  Administered 2012-03-24: 17 g via ORAL
  Filled 2012-03-24: qty 1

## 2012-03-24 MED ORDER — INSULIN REGULAR HUMAN 100 UNIT/ML IJ SOLN
INTRAMUSCULAR | Status: AC
  Administered 2012-03-24: 18:00:00 via INTRAVENOUS
  Filled 2012-03-24: qty 2000

## 2012-03-24 NOTE — Progress Notes (Signed)
Patient ID: Katie Galvan, female   DOB: 11-05-1927, 79 y.o.   MRN: [id number] [facility] PROGRESS NOTE  Talyah Seder GEX:[id number] DOB: 1927-01-30 DOA: 02/29/2012 PCP: Michele Mcalpine, MD  Brief narrative:  Pt is 79 year old female with complex medical history including diverticulitis, ischemic cardiomyopathy (EF 30%), perimyocarditis, COPD, endocarditis, recurrent falls, who presented to [facility] ED 02/29/2012 due to abdominal pain, nausea and vomiting. In ED, CT abd/pelvis consistent with sigmoid diverticulitis worse compared to previous imaging studies. Surgery and GI following. Pt is now s/p L colectomy and colostomy on 3/13 and with subsequent development of post op ileus.   Assessment and Plan:  Principal Problem:  *Acute Sigmoid Diverticulitis  Status post left colectomy and colostomy placement, post op day #10 Advanced to full liquid diet, OOB and ambulation, emphasized importance of continuing physical therapy  Slow overall recovery due to significant deconditioning  Hyperkalemia: corrected  Stable, BMP in AM Hyperglycemia  Stable, continue SSI, HBAIC is 5.4  UTI (lower urinary tract infection)  Secondary to E.Coli, Patient completed 5 days of ceftriaxone.  Chronic respiratory failure with hypoxia /COPD  Stable, pt maintaining oxygen saturations at target range, continue symbicort, Albuterol Q 6 hours PRN, Incentive spirometry Q1H  Chronic Diastolic CHF :  Restart BB once PO intake resumed, for now continue IV, renal function stable  Lasix 20mg  daily, Follow I/Os Bilateral lower extremity ulcers  Stable, daily dressing changes Severe protein calorie malnutrition  On TNA, full liquids continue today Anemia of chronic disease   Hemoglobin and hematocrit stable and at pt's baseline  Consultants:  Dr. Arlyce Dice (GI) --> signed off  Dr. Ezzard Standing (Surgery)  Dr. Molli Knock [facility]) --> signed off  Dr. Excell Seltzer (Cardiology) --> signed off Procedures:  Left colectomy and colostomy  03/14/2012 by Dr. Carolynne Edouard  Antibiotics:  Fluconazole 03/10/2012 -->3/15  Zosyn 03/10/2012 -->3/15  Flagyl 03/12/2012 -->3/14  Code Status: DNR  Family Communication: Pt, no son at bedside  Disposition Plan: SNF  HPI/Subjective: No events overnight.   Objective: Filed Vitals:   03/23/12 1815 03/23/12 2200 03/24/12 0530 03/24/12 0854  BP: 127/71 107/44 126/60   Pulse: 72 67 94   Temp:  98 F (36.7 C) 97.8 F (36.6 C)   TempSrc:  Oral Oral   Resp: 22 16 18    Height:      Weight:   66.543 kg (146 lb 11.2 oz)   SpO2: 99% 100% 96% 94%    Intake/Output Summary (Last 24 hours) at 03/24/12 1107 Last data filed at 03/24/12 1055  Gross per 24 hour  Intake 1957.67 ml  Output   1200 ml  Net 757.67 ml    Exam:   General:  Pt is alert, follows commands appropriately, not in acute distress  Cardiovascular: Regular rate and rhythm, S1/S2, no murmurs, no rubs, no gallops  Respiratory: Clear to auscultation bilaterally, no wheezing, no crackles, no rhonchi  Abdomen: Soft, tender in epigastric area, slightly distended, bowel sounds present, no guarding, ostomy in place   Extremities: No edema, pulses DP and PT palpable bilaterally  Neuro: Grossly nonfocal  Data Reviewed: Basic Metabolic Panel:  Recent Labs Lab 03/18/12 0400  03/20/12 0400 03/21/12 0420 03/22/12 0524 03/23/12 0500 03/24/12 0500  NA 135  < > 137 139 137 134* 137  K 4.7  < > 4.2 4.7 4.3 4.1 4.2  CL 102  < > 103 104 103 99 103  CO2 29  < > 29 28 29 26 29   GLUCOSE 137*  < > 108*  85 114* 143* 124*  BUN 46*  < > 35* 34* 33* 35* 32*  CREATININE 0.94  < > 0.81 0.88 0.83 0.79 0.73  CALCIUM 9.3  < > 9.5 9.5 9.2 9.2 9.2  MG 2.0  --   --  2.1  --   --   --   PHOS 2.9  --   --  3.2  --   --   --   < > = values in this interval not displayed. Liver Function Tests:  Recent Labs Lab 03/20/12 0400 03/21/12 0420 03/22/12 0524 03/23/12 0500 03/24/12 0500  AST 29 46* 31 97* 36  ALT 33 51* 44* 125* 79*  ALKPHOS 130*  129* 116 146* 130*  BILITOT 0.3 0.2* 0.2* 0.2* 0.2*  PROT 5.7* 5.8* 5.6* 5.7* 5.6*  ALBUMIN 2.1* 2.2* 2.1* 2.2* 2.2*   CBC:  Recent Labs Lab 03/18/12 0400  03/20/12 0400 03/21/12 0420 03/22/12 0524 03/23/12 0500 03/24/12 0500  WBC 10.1  < > 11.3* 13.7* 10.8* 10.0 9.3  NEUTROABS 7.3  --   --   --   --   --   --   HGB 8.4*  < > 9.0* 9.1* 8.8* 8.6* 8.5*  HCT 26.6*  < > 27.9* 28.6* 27.8* 27.3* 26.2*  MCV 91.7  < > 91.5 92.6 91.7 91.6 91.0  PLT 458*  < > 481* 505* 454* 453* 418*  < > = values in this interval not displayed.  CBG:  Recent Labs Lab 03/23/12 0545 03/23/12 1148 03/23/12 1705 03/23/12 2349 03/24/12 0556  GLUCAP 166* 128* 119* 136* 122*    Recent Results (from the past 240 hour(s))  MRSA PCR SCREENING     Status: None   Collection Time    03/14/12  5:55 PM      Result Value Range Status   MRSA by PCR NEGATIVE  NEGATIVE Final   Comment:            The GeneXpert MRSA Assay (FDA     approved for NASAL specimens     only), is one component of a     comprehensive MRSA colonization     surveillance program. It is not     intended to diagnose MRSA     infection nor to guide or     monitor treatment for     MRSA infections.     Scheduled Meds: . antiseptic oral rinse  15 mL Mouth Rinse q12n4p  . budesonide-formoterol  1 puff Inhalation BID  . chlorhexidine  15 mL Mouth Rinse BID  . enoxaparin (LOVENOX) injection  40 mg Subcutaneous Q24H  . furosemide  20 mg Intravenous Daily  . insulin aspart  0-15 Units Subcutaneous Q6H  . metoprolol  10 mg Intravenous Q6H  . ondansetron (ZOFRAN) IV  8 mg Intravenous Q6H  . pantoprazole (PROTONIX) IV  40 mg Intravenous Q24H  . polyethylene glycol  17 g Oral Once  . tiotropium  18 mcg Inhalation Daily   Continuous Infusions: . sodium chloride 20 mL/hr at 03/24/12 0350  . TPN (CLINIMIX) +/- additives 60 mL/hr at 03/23/12 1754  . TPN [facility]) +/- additives       Debbora Presto, MD  [facility] Pager [phone]  If  7PM-7AM, please contact night-coverage [email or web address] Password [email or web address] 03/24/2012, 11:07 AM   LOS: 24 days

## 2012-03-24 NOTE — Progress Notes (Signed)
10 Days Post-Op  Subjective: Alert. Quite deconditioned. Friendly and making jokes. Takes 2 people to move her from chair to bed.  Tolerating full liquids fair but no appetite. Minimal output from colostomy. No nausea.  WBC 9300, hemoglobin 8.5. Albumin 2.2. Creatinine 0.73. Glucose 124.   Objective: Vital signs in last 24 hours: Temp:  [97.6 F (36.4 C)-98 F (36.7 C)] 97.8 F (36.6 C) (03/23 0530) Pulse Rate:  [67-94] 94 (03/23 0530) Resp:  [16-22] 18 (03/23 0530) BP: (107-127)/(44-71) 126/60 mmHg (03/23 0530) SpO2:  [94 %-100 %] 94 % (03/23 0854) Weight:  [146 lb 11.2 oz (66.543 kg)] 146 lb 11.2 oz (66.543 kg) (03/23 0530) Last BM Date: 03/23/12  Intake/Output from previous day: 03/22 0701 - 03/23 0700 In: 1957.7 [P.O.:180; I.V.:455.7; IV Piggyback:200; TPN:1122] Out: 1125 [Urine:1125] Intake/Output this shift:    General appearance: Alert. Oriented. Good spirits afebrile and week. No distress. GI: abdomen soft. Minimally tender. Not distended. Midline wound clean. Minimal stool and flatus and colostomy bag.  Lab Results:  Results for orders placed during the hospital encounter of 02/29/12 (from the past 24 hour(s))  GLUCOSE, CAPILLARY     Status: Abnormal   Collection Time    03/23/12 11:48 AM      Result Value Range   Glucose-Capillary 128 (*) 70 - 99 mg/dL  GLUCOSE, CAPILLARY     Status: Abnormal   Collection Time    03/23/12  5:05 PM      Result Value Range   Glucose-Capillary 119 (*) 70 - 99 mg/dL  GLUCOSE, CAPILLARY     Status: Abnormal   Collection Time    03/23/12 11:49 PM      Result Value Range   Glucose-Capillary 136 (*) 70 - 99 mg/dL   Comment 1 Notify RN     Comment 2 Documented in Chart    COMPREHENSIVE METABOLIC PANEL     Status: Abnormal   Collection Time    03/24/12  5:00 AM      Result Value Range   Sodium 137  135 - 145 mEq/L   Potassium 4.2  3.5 - 5.1 mEq/L   Chloride 103  96 - 112 mEq/L   CO2 29  19 - 32 mEq/L   Glucose, Bld 124 (*)  70 - 99 mg/dL   BUN 32 (*) 6 - 23 mg/dL   Creatinine, Ser 1.61  0.50 - 1.10 mg/dL   Calcium 9.2  8.4 - 09.6 mg/dL   Total Protein 5.6 (*) 6.0 - 8.3 g/dL   Albumin 2.2 (*) 3.5 - 5.2 g/dL   AST 36  0 - 37 U/L   ALT 79 (*) 0 - 35 U/L   Alkaline Phosphatase 130 (*) 39 - 117 U/L   Total Bilirubin 0.2 (*) 0.3 - 1.2 mg/dL   GFR calc non Af Amer 77 (*) >90 mL/min   GFR calc Af Amer 90 (*) >90 mL/min  CBC     Status: Abnormal   Collection Time    03/24/12  5:00 AM      Result Value Range   WBC 9.3  4.0 - 10.5 K/uL   RBC 2.88 (*) 3.87 - 5.11 MIL/uL   Hemoglobin 8.5 (*) 12.0 - 15.0 g/dL   HCT 04.5 (*) 40.9 - 81.1 %   MCV 91.0  78.0 - 100.0 fL   MCH 29.5  26.0 - 34.0 pg   MCHC 32.4  30.0 - 36.0 g/dL   RDW 91.4 (*) 78.2 - 95.6 %  Platelets 418 (*) 150 - 400 K/uL  GLUCOSE, CAPILLARY     Status: Abnormal   Collection Time    03/24/12  5:56 AM      Result Value Range   Glucose-Capillary 122 (*) 70 - 99 mg/dL     Studies/Results: @RISRSLT24 @  . antiseptic oral rinse  15 mL Mouth Rinse q12n4p  . budesonide-formoterol  1 puff Inhalation BID  . chlorhexidine  15 mL Mouth Rinse BID  . enoxaparin (LOVENOX) injection  40 mg Subcutaneous Q24H  . furosemide  20 mg Intravenous Daily  . insulin aspart  0-15 Units Subcutaneous Q6H  . metoprolol  10 mg Intravenous Q6H  . ondansetron (ZOFRAN) IV  8 mg Intravenous Q6H  . pantoprazole (PROTONIX) IV  40 mg Intravenous Q24H  . polyethylene glycol  17 g Oral Once  . tiotropium  18 mcg Inhalation Daily     Assessment/Plan: s/p Procedure(s): COLON RESECTION SIGMOID COLOSTOMY  POD #10. Hartmann's resection, left colectomy and colostomy, for acute sigmoid diverticulitis. Ileus slow to resolve. We'll give  one dose of MiraLAX today. Once stools increase, advance to low-fat diet  Deconditioned. Continue OT and PT. We'll likely need SNF/rehabilitation. Disposition per medicine service.  Protein calorie malnutrition   COPD  Chronic diastolic  CHF  @PROBHOSP @  LOS: 24 days    Gabriele Zwilling M. Derrell Lolling, M.D., [facility] General and Minimally invasive Surgery Breast and Colorectal Surgery Office:   [phone] Pager:   [phone]  03/24/2012  . .prob

## 2012-03-24 NOTE — Progress Notes (Signed)
PARENTERAL NUTRITION CONSULT NOTE - FOLLOW UP  Pharmacy Consult for TNA Indication: Intolerance to enteral feeding s/p sigmoid colon resection 3/13 for diverticulitis  Allergies  Allergen Reactions  . Sulfonamide Derivatives Swelling    Patient Measurements: Height: 5' (152.4 cm) Weight: 146 lb 11.2 oz (66.543 kg) IBW/kg (Calculated) : 45.5 Usual Weight: 65 kg  Vital Signs: Temp: 97.8 F (36.6 C) (03/23 0530) Temp src: Oral (03/23 0530) BP: 126/60 mmHg (03/23 0530) Pulse Rate: 94 (03/23 0530)  Intake/Output from previous day: 03/22 0701 - 03/23 0700 In: 1957.7 [P.O.:180; I.V.:455.7; IV Piggyback:200; TPN:1122] Out: 1125 [Urine:1125]  Labs:  Recent Labs  03/22/12 0524 03/23/12 0500 03/24/12 0500  WBC 10.8* 10.0 9.3  HGB 8.8* 8.6* 8.5*  HCT 27.8* 27.3* 26.2*  PLT 454* 453* 418*     Recent Labs  03/22/12 0524 03/23/12 0500 03/24/12 0500  NA 137 134* 137  K 4.3 4.1 4.2  CL 103 99 103  CO2 29 26 29   GLUCOSE 114* 143* 124*  BUN 33* 35* 32*  CREATININE 0.83 0.79 0.73  CALCIUM 9.2 9.2 9.2  PROT 5.6* 5.7* 5.6*  ALBUMIN 2.1* 2.2* 2.2*  AST 31 97* 36  ALT 44* 125* 79*  ALKPHOS 116 146* 130*  BILITOT 0.2* 0.2* 0.2*   Estimated Creatinine Clearance: 46.1 ml/min (by C-G formula based on Cr of 0.73).    Recent Labs  03/23/12 0545 03/23/12 1148 03/23/12 1705  GLUCAP 166* 128* 119*    Nutritional Goals:  - RD recs per day: 1420-1660 Kcal, 80-94 g protein, 2L fluid - Clinimix E5/15 at 91ml/h + lipids 35ml/hr MWF will provide 90gm protein and weekly avg of 1484 kcals (1278kcal TTSS and 1758 kcals MWF).   Current nutrition:  - Diet: Advanced to FLD on 3/20  - TNA: Clinimix E 5/15 @ goal rate of 60 ml/hr - mIVF: NS at [facility]  CBGs & Insulin requirements past 24 hours:  - CBGs essentially within goal of 150mg /dl with the addition of insulin to TNA (~ 11 units/24 hours) - 5 units SSI insulin given outside TNA  Labs: Electrolytes: WNL Renal Function: Scr  wnl Hepatic Function: AST/ALT, Alk phos elevated, trending back down now Pre-Albumin: 7.9 (3/5), 12.7 (3/10), 26.7 (3/17) Triglycerides: 80 (3/5), 79 (3/10), 171 (3/17)  CBGs: essentially within goal range <  150mg /dl   Assessment:  79 yo F with h/o diveriticulitis presented 2/27 with abd pain, N/V and fatigue. CT abd/pelvis in ED with worsened chronic inflammatory process involving the sigmoid colon. Pt admitted for acute on chronic sigmoiditis, diverticulitis, and UTI. GI on board, pt did not improve with Cipro/Flagyl. Repeat CT 3/3 with chronic sigmoid colon thickening, small area of air either contained within diverticulum or escaped from bowel.  TNA ordered to start 3/4 given intolerance to enteral feeding. S/p sigmoid colectomy and colostomy 3/13.  Now with post-op ileus.  Today is Day #19 TNA. Clinimix formulation changed to E5/15 on 3/17 pm to reduce dextrose.  Diet advanced to FLD on 3/20 - apparently tolerating some PO, but reports not being hungry. LFTs are elevated, were trending up yesterday, but now trending back down this am after reducing TNA infusion rate last night.   Plan:   Continue Clinimix E 5/15 to 60 ml/hr (~80% support) due to rising LFTs and to encourage PO intake. TNA to contain IV fat emulsion 20% at 10 ml/hr on MWF only due to ongoing shortage  Continue insulin 8 units/L in TNA (will receive ~11 units/24hr with new, reduced  rate)  Continue SSI coverage moderate scale q6h  Standard multivitamins and trace elements on MWF only due to ongoing shortage. Patient not taking other meds by mouth so unable to convert MVI to PO at this time. TNA labs Monday/Thursdays - watch LFTs Watch CBGs with rate/insulin change and PO intake  Darrol Angel, PharmD Pager: [phone] 7:29 AM 03/24/2012

## 2012-03-25 LAB — DIFFERENTIAL
Lymphocytes Relative: 16 % (ref 12–46)
Monocytes Absolute: 0.8 10*3/uL (ref 0.1–1.0)
Monocytes Relative: 8 % (ref 3–12)
Neutro Abs: 7.1 10*3/uL (ref 1.7–7.7)

## 2012-03-25 LAB — CBC
Hemoglobin: 8.3 g/dL — ABNORMAL LOW (ref 12.0–15.0)
MCH: 29 pg (ref 26.0–34.0)
MCV: 91.6 fL (ref 78.0–100.0)
Platelets: 414 10*3/uL — ABNORMAL HIGH (ref 150–400)
RBC: 2.86 MIL/uL — ABNORMAL LOW (ref 3.87–5.11)

## 2012-03-25 LAB — COMPREHENSIVE METABOLIC PANEL
ALT: 70 U/L — ABNORMAL HIGH (ref 0–35)
AST: 34 U/L (ref 0–37)
Albumin: 2.2 g/dL — ABNORMAL LOW (ref 3.5–5.2)
Chloride: 103 mEq/L (ref 96–112)
Creatinine, Ser: 0.77 mg/dL (ref 0.50–1.10)
Sodium: 137 mEq/L (ref 135–145)
Total Bilirubin: 0.3 mg/dL (ref 0.3–1.2)

## 2012-03-25 LAB — PREALBUMIN: Prealbumin: 23.7 mg/dL (ref 17.0–34.0)

## 2012-03-25 LAB — GLUCOSE, CAPILLARY
Glucose-Capillary: 152 mg/dL — ABNORMAL HIGH (ref 70–99)
Glucose-Capillary: 145 mg/dL — ABNORMAL HIGH (ref 70–99)

## 2012-03-25 MED ORDER — TRACE MINERALS CR-CU-F-FE-I-MN-MO-SE-ZN IV SOLN
INTRAVENOUS | Status: DC
  Administered 2012-03-25: 19:00:00 via INTRAVENOUS
  Filled 2012-03-25: qty 2000

## 2012-03-25 MED ORDER — FAT EMULSION 20 % IV EMUL
240.0000 mL | INTRAVENOUS | Status: DC
  Administered 2012-03-25: 240 mL via INTRAVENOUS
  Filled 2012-03-25: qty 250

## 2012-03-25 NOTE — Progress Notes (Signed)
OT Cancellation Note  Patient Details Name: Katie Galvan MRN: [id number] DOB: 05-15-1927   Cancelled Treatment:    Reason Eval/Treat Not Completed: Pain limiting ability to participate. Pt politely declined saying she had just gotten back in bed.  Will re-attempt as time permits.  Burel Kahre OTR/L Pager number [phone] 03/25/2012, 3:48 PM

## 2012-03-25 NOTE — Progress Notes (Signed)
Physical Therapy Treatment Patient Details Name: Katie Galvan MRN: [id number] DOB: 10-03-1927 Today's Date: 03/25/2012 Time: 9147-8295 PT Time Calculation (min): 23 min  PT Assessment / Plan / Recommendation Comments on Treatment Session  Pt ambulated in hallway with rest breaks as needed.  Pt left on [facility] with call bell however RN into room upon exiting and reported she would assist pt back to bed.    Follow Up Recommendations  SNF     Does the patient have the potential to tolerate intense rehabilitation     Barriers to Discharge        Equipment Recommendations  Rolling walker with 5" wheels    Recommendations for Other Services    Frequency     Plan Discharge plan remains appropriate;Frequency remains appropriate    Precautions / Restrictions Precautions Precautions: Fall Precaution Comments: O2 dependent, colostomy    Pertinent Vitals/Pain Pt reported still having abdominal pain however agreeable to ambulate after encouragement    Mobility  Bed Mobility Bed Mobility: Sit to Supine Supine to Sit: 4: Min assist Details for Bed Mobility Assistance: assist for trunk upright Transfers Transfers: Sit to Stand;Stand to Sit Sit to Stand: 4: Min guard;With upper extremity assist;From chair/3-in-1;From bed Stand to Sit: 4: Min guard;With upper extremity assist;To chair/3-in-1 Details for Transfer Assistance: verbal cues for safe technique, performed multiple times for rest breaks during ambulation Ambulation/Gait Ambulation/Gait Assistance: 4: Min guard Ambulation Distance (Feet): 60 Feet Ambulation/Gait Assistance Details: +2 for chair to follow, ambulated on 2L O2 Bells, verbal cues for posture and RW distance, 2 seated rest breaks required due to fatigue Gait Pattern: Step-through pattern;Decreased stride length;Trunk flexed Gait velocity: very slow speed    Exercises     PT Diagnosis:    PT Problem List:   PT Treatment Interventions:     PT Goals Acute Rehab PT  Goals Time For Goal Achievement: 04/02/12 Potential to Achieve Goals: Good Pt will go Supine/Side to Sit: with min assist;with supervision PT Goal: Supine/Side to Sit - Progress: Updated due to goal met Pt will go Sit to Stand: with supervision PT Goal: Sit to Stand - Progress: Progressing toward goal Pt will go Stand to Sit: with supervision PT Goal: Stand to Sit - Progress: Progressing toward goal Pt will Ambulate: with least restrictive assistive device;51 - 150 feet;with supervision PT Goal: Ambulate - Progress: Updated due to goal met  Visit Information  Last PT Received On: 03/25/12 Assistance Needed: +2    Subjective Data  Subjective: It still hurts (abdomen)   Cognition  Cognition Overall Cognitive Status: Appears within functional limits for tasks assessed/performed Arousal/Alertness: Awake/alert Orientation Level: Appears intact for tasks assessed Behavior During Session: [facility] for tasks performed    Balance     End of Session PT - End of Session Equipment Utilized During Treatment: Oxygen Activity Tolerance: Patient limited by fatigue Patient left: with call bell/phone within reach;Other (comment) (BSC)   GP     Fujie Dickison,[name] 03/25/2012, 4:19 PM Zenovia Jarred, PT, DPT 03/25/2012 Pager: [phone]

## 2012-03-25 NOTE — Progress Notes (Signed)
Patient ID: Katie Galvan, female   DOB: November 01, 1927, 79 y.o.   MRN: [id number]  [facility] PROGRESS NOTE  Katie Galvan WUX:[id number] DOB: 1927-02-05 DOA: 02/29/2012 PCP: Michele Mcalpine, MD  Brief narrative:  Pt is 79 year old female with complex medical history including diverticulitis, ischemic cardiomyopathy (EF 30%), perimyocarditis, COPD, endocarditis, recurrent falls, who presented to [facility] ED 02/29/2012 due to abdominal pain, nausea and vomiting. In ED, CT abd/pelvis consistent with sigmoid diverticulitis worse compared to previous imaging studies. Surgery and GI following. Pt is now s/p L colectomy and colostomy on 3/13 and with subsequent development of post op ileus.   Assessment and Plan:  Principal Problem:  *Acute Sigmoid Diverticulitis  Status post left colectomy and colostomy placement, post op day #11 Advanced to full liquid diet, OOB and ambulation, emphasized importance of continuing physical therapy  Slow overall recovery due to significant deconditioning, SNF recommended and family inagreement  Hyperkalemia: corrected  Stable, BMP in AM Hyperglycemia  Stable, continue SSI, HBAIC is 5.4  UTI (lower urinary tract infection)  Secondary to E.Coli, Patient completed 5 days of ceftriaxone.  Chronic respiratory failure with hypoxia /COPD  Stable, pt maintaining oxygen saturations at target range, continue symbicort, Albuterol Q 6 hours PRN, Incentive spirometry Q1H  Chronic Diastolic CHF :  Restart BB once PO intake resumed, for now continue IV, renal function stable  Lasix 20mg  daily, Follow I/Os Bilateral lower extremity ulcers  Stable, daily dressing changes Severe protein calorie malnutrition  On TNA, full liquids continue today Anemia of chronic disease   Hemoglobin and hematocrit stable and at pt's baseline  Consultants:  Dr. Arlyce Dice (GI) --> signed off  Dr. Ezzard Standing (Surgery)  Dr. Molli Knock [facility]) --> signed off  Dr. Excell Seltzer (Cardiology) --> signed off   Procedures:  Left colectomy and colostomy 03/14/2012 by Dr. Carolynne Edouard  Antibiotics:  Fluconazole 03/10/2012 -->3/15  Zosyn 03/10/2012 -->3/15  Flagyl 03/12/2012 -->3/14 Code Status: DNR  Family Communication: Pt, no son at bedside  Disposition Plan: SNF once clear by surgery   HPI/Subjective: No events overnight.   Objective: Filed Vitals:   03/24/12 1331 03/24/12 2100 03/25/12 0618 03/25/12 0850  BP: 101/48 127/68 122/61   Pulse: 68 78 93   Temp: 97.3 F (36.3 C) 98.2 F (36.8 C) 97.2 F (36.2 C)   TempSrc: Oral Oral Oral   Resp: 18 16 24    Height:      Weight:   58.4 kg (128 lb 12 oz)   SpO2: 100% 97% 100% 97%    Intake/Output Summary (Last 24 hours) at 03/25/12 1423 Last data filed at 03/25/12 1104  Gross per 24 hour  Intake   1445 ml  Output    975 ml  Net    470 ml    Exam:   General:  Pt is alert, follows commands appropriately, not in acute distress  Cardiovascular: Regular rate and rhythm, S1/S2, no murmurs, no rubs, no gallops  Respiratory: Clear to auscultation bilaterally, no wheezing, no crackles, no rhonchi  Abdomen: Soft, non tender, non distended, bowel sounds present, no guarding  Data Reviewed: Basic Metabolic Panel:  Recent Labs Lab 03/21/12 0420 03/22/12 0524 03/23/12 0500 03/24/12 0500 03/25/12 0430  NA 139 137 134* 137 137  K 4.7 4.3 4.1 4.2 4.0  CL 104 103 99 103 103  CO2 28 29 26 29 29   GLUCOSE 85 114* 143* 124* 109*  BUN 34* 33* 35* 32* 31*  CREATININE 0.88 0.83 0.79 0.73 0.77  CALCIUM 9.5 9.2 9.2  9.2 9.2  MG 2.1  --   --   --  2.0  PHOS 3.2  --   --   --  2.9   Liver Function Tests:  Recent Labs Lab 03/21/12 0420 03/22/12 0524 03/23/12 0500 03/24/12 0500 03/25/12 0430  AST 46* 31 97* 36 34  ALT 51* 44* 125* 79* 70*  ALKPHOS 129* 116 146* 130* 124*  BILITOT 0.2* 0.2* 0.2* 0.2* 0.3  PROT 5.8* 5.6* 5.7* 5.6* 5.7*  ALBUMIN 2.2* 2.1* 2.2* 2.2* 2.2*   No results found for this basename: LIPASE, AMYLASE,  in the last 168  hours No results found for this basename: AMMONIA,  in the last 168 hours CBC:  Recent Labs Lab 03/21/12 0420 03/22/12 0524 03/23/12 0500 03/24/12 0500 03/25/12 0430  WBC 13.7* 10.8* 10.0 9.3 9.8  NEUTROABS  --   --   --   --  7.1  HGB 9.1* 8.8* 8.6* 8.5* 8.3*  HCT 28.6* 27.8* 27.3* 26.2* 26.2*  MCV 92.6 91.7 91.6 91.0 91.6  PLT 505* 454* 453* 418* 414*   CBG:  Recent Labs Lab 03/23/12 2349 03/24/12 0556 03/24/12 1209 03/24/12 1831 03/25/12 1145  GLUCAP 136* 122* 114* 133* 152*      Scheduled Meds: . antiseptic oral rinse  15 mL Mouth Rinse q12n4p  . budesonide-formoterol  1 puff Inhalation BID  . chlorhexidine  15 mL Mouth Rinse BID  . enoxaparin (LOVENOX) injection  40 mg Subcutaneous Q24H  . furosemide  20 mg Intravenous Daily  . insulin aspart  0-15 Units Subcutaneous Q6H  . metoprolol  10 mg Intravenous Q6H  . ondansetron (ZOFRAN) IV  8 mg Intravenous Q6H  . pantoprazole (PROTONIX) IV  40 mg Intravenous Q24H  . tiotropium  18 mcg Inhalation Daily   Continuous Infusions: . sodium chloride 20 mL/hr at 03/24/12 0350  . Marland KitchenTPN (CLINIMIX-E) Adult     And  . fat emulsion    . Marland KitchenTPN (CLINIMIX-E) Adult 60 mL/hr at 03/24/12 [name], MD  [facility] Pager [phone]  If 7PM-7AM, please contact night-coverage [email or web address] Password [email or web address] 03/25/2012, 2:23 PM   LOS: 25 days

## 2012-03-25 NOTE — Progress Notes (Signed)
Doing well.  No acute surgical issues. Ready for discharge to SNF once placement is complete.  Wilmon Arms. Corliss Skains, MD, [facility]  03/25/2012 1:39 PM

## 2012-03-25 NOTE — Progress Notes (Signed)
PARENTERAL NUTRITION CONSULT NOTE - FOLLOW UP  Pharmacy Consult for TNA Indication: Intolerance to enteral feeding s/p sigmoid colon resection 3/13 for diverticulitis  Allergies  Allergen Reactions  . Sulfonamide Derivatives Swelling    Patient Measurements: Height: 5' (152.4 cm) Weight: 128 lb 12 oz (58.4 kg) (standing) IBW/kg (Calculated) : 45.5 Usual Weight: 65 kg  Vital Signs: Temp: 97.2 F (36.2 C) (03/24 0618) Temp src: Oral (03/24 0618) BP: 122/61 mmHg (03/24 0618) Pulse Rate: 93 (03/24 0618)  Intake/Output from previous day: 03/23 0701 - 03/24 0700 In: 1475 [P.O.:120; I.V.:490; IV Piggyback:200; TPN:665] Out: 1325 [Urine:1325]  Labs:  Recent Labs  03/23/12 0500 03/24/12 0500 03/25/12 0430  WBC 10.0 9.3 9.8  HGB 8.6* 8.5* 8.3*  HCT 27.3* 26.2* 26.2*  PLT 453* 418* 414*     Recent Labs  03/23/12 0500 03/24/12 0500 03/25/12 0430  NA 134* 137 137  K 4.1 4.2 4.0  CL 99 103 103  CO2 26 29 29   GLUCOSE 143* 124* 109*  BUN 35* 32* 31*  CREATININE 0.79 0.73 0.77  CALCIUM 9.2 9.2 9.2  MG  --   --  2.0  PHOS  --   --  2.9  PROT 5.7* 5.6* 5.7*  ALBUMIN 2.2* 2.2* 2.2*  AST 97* 36 34  ALT 125* 79* 70*  ALKPHOS 146* 130* 124*  BILITOT 0.2* 0.2* 0.3  TRIG  --   --  117  CHOL  --   --  167   Estimated Creatinine Clearance: 43.4 ml/min (by C-G formula based on Cr of 0.77).    Recent Labs  03/24/12 0556 03/24/12 1209 03/24/12 1831  GLUCAP 122* 114* 133*   Assessment:  79 yo F with h/o diveriticulitis presented 2/27 with abd pain, N/V and fatigue. CT abd/pelvis in ED with worsened chronic inflammatory process involving the sigmoid colon. Pt admitted for acute on chronic sigmoiditis, diverticulitis, and UTI. GI on board, pt did not improve with Cipro/Flagyl. Repeat CT 3/3 with chronic sigmoid colon thickening, small area of air either contained within diverticulum or escaped from bowel.  TNA ordered to start 3/4 given intolerance to enteral feeding. S/p  sigmoid colectomy and colostomy 3/13.  Now with post-op ileus. Diet advanced to FLD on 3/20.    3/24: Today is D#20 TNA. Pt with poor appetite, 120 mL po intake documented yesterday. MD noted once stools increase, will advance diet to low-fat. Last stool documentation 3/20.   Nutritional Goals:  -RD recs per day: 1420-1660 Kcal, 80-94 g protein, 2L fluid -Clinimix E5/15 at 44ml/h + lipids 30ml/hr MWF will provide 90gm protein and weekly avg of 1484 kcals (1278kcal TTSS and 1758 kcals MWF).   Current nutrition:  - Diet: FLD starting 3/20 - TNA: Clinimix E 5/15 @ 60 ml/hr - mIVF: NS @ KVO  CBGs & Insulin requirements past 24 hours:  - Less than 150, required 6 units of Novolog SSI and 16 units of Regular insulin in 2 L TNA bag (~ 11 units/24 hours)   Labs: Electrolytes: all wnl  Renal Function: Scr wnl/stable (I/O net +430 ml, UOP 0.95 ml/kg/hr)  Hepatic Function: AST/ALT, Alk phos improving Pre-Albumin: 7.9 (3/5), 12.7 (3/10), 26.7 (3/17) Triglycerides: elevated 3/17 but wnl 3/24  CBGs: Within goal range <  150mg /dl   Plan:    Continue Clinimix E 5/15 @ 60 ml/hr (~80% support) for now so LFTS can fully normalize and encourage PO intake.  Continue insulin 8 units/L in TNA (will receive ~11 units/24hr with new,  reduced rate)   Continue SSI coverage moderate scale q6h   TNA to contain IV fat emulsion 20% at 10 ml/hr on MWF only due to ongoing shortage  Standard multivitamins and trace elements on MWF only due to ongoing shortage. Patient not taking other meds by mouth so unable to convert MVI to PO at this time.  TNA labs Monday/Thursdays  Pharmacy will follow up daily   Geoffry Paradise, PharmD, BCPS Pager: [phone] 8:40 AM Pharmacy #: [phone]

## 2012-03-25 NOTE — Progress Notes (Signed)
CSW continues to follow for discharge to [facility] when medically ready. Jaleea Alesi C. Leitha Hyppolite MSW, LCSW [phone]

## 2012-03-25 NOTE — Progress Notes (Signed)
Patient ID: Katie Galvan, female   DOB: 09/19/1927, 79 y.o.   MRN: [id number] 11 Days Post-Op  Subjective: No new complaints today, abd sore but not changed, tolerating diet but wants more options to eat, denies n/v.   Objective: Vital signs in last 24 hours: Temp:  [97.2 F (36.2 C)-98.2 F (36.8 C)] 97.2 F (36.2 C) (03/24 0618) Pulse Rate:  [68-93] 93 (03/24 0618) Resp:  [16-24] 24 (03/24 0618) BP: (101-127)/(48-68) 122/61 mmHg (03/24 0618) SpO2:  [97 %-100 %] 97 % (03/24 0850) Weight:  [128 lb 12 oz (58.4 kg)] 128 lb 12 oz (58.4 kg) (03/24 0618) Last BM Date: 03/23/12  Intake/Output from previous day: 03/23 0701 - 03/24 0700 In: 1475 [P.O.:120; I.V.:490; IV Piggyback:200; TPN:665] Out: 1325 [Urine:1325] Intake/Output this shift: Total I/O In: -  Out: 100 [Urine:100]  General: Alert. Good spirits. No distress. GI: abdomen soft. Minimally tender. non distended. Midline wound clean. Semi solid stool coming out of ostomy, air in bag, +BS  Lab Results:  Results for orders placed during the hospital encounter of 02/29/12 (from the past 24 hour(s))  GLUCOSE, CAPILLARY     Status: Abnormal   Collection Time    03/24/12 12:09 PM      Result Value Range   Glucose-Capillary 114 (*) 70 - 99 mg/dL  GLUCOSE, CAPILLARY     Status: Abnormal   Collection Time    03/24/12  6:31 PM      Result Value Range   Glucose-Capillary 133 (*) 70 - 99 mg/dL   Comment 1 Documented in Chart     Comment 2 Notify RN    COMPREHENSIVE METABOLIC PANEL     Status: Abnormal   Collection Time    03/25/12  4:30 AM      Result Value Range   Sodium 137  135 - 145 mEq/L   Potassium 4.0  3.5 - 5.1 mEq/L   Chloride 103  96 - 112 mEq/L   CO2 29  19 - 32 mEq/L   Glucose, Bld 109 (*) 70 - 99 mg/dL   BUN 31 (*) 6 - 23 mg/dL   Creatinine, Ser 9.62  0.50 - 1.10 mg/dL   Calcium 9.2  8.4 - 95.2 mg/dL   Total Protein 5.7 (*) 6.0 - 8.3 g/dL   Albumin 2.2 (*) 3.5 - 5.2 g/dL   AST 34  0 - 37 U/L   ALT 70 (*)  0 - 35 U/L   Alkaline Phosphatase 124 (*) 39 - 117 U/L   Total Bilirubin 0.3  0.3 - 1.2 mg/dL   GFR calc non Af Amer 76 (*) >90 mL/min   GFR calc Af Amer 88 (*) >90 mL/min  CBC     Status: Abnormal   Collection Time    03/25/12  4:30 AM      Result Value Range   WBC 9.8  4.0 - 10.5 K/uL   RBC 2.86 (*) 3.87 - 5.11 MIL/uL   Hemoglobin 8.3 (*) 12.0 - 15.0 g/dL   HCT 84.1 (*) 32.4 - 40.1 %   MCV 91.6  78.0 - 100.0 fL   MCH 29.0  26.0 - 34.0 pg   MCHC 31.7  30.0 - 36.0 g/dL   RDW 02.7 (*) 25.3 - 66.4 %   Platelets 414 (*) 150 - 400 K/uL  MAGNESIUM     Status: None   Collection Time    03/25/12  4:30 AM      Result Value Range   Magnesium 2.0  1.5 - 2.5 mg/dL  PHOSPHORUS     Status: None   Collection Time    03/25/12  4:30 AM      Result Value Range   Phosphorus 2.9  2.3 - 4.6 mg/dL  DIFFERENTIAL     Status: None   Collection Time    03/25/12  4:30 AM      Result Value Range   Neutrophils Relative 72  43 - 77 %   Neutro Abs 7.1  1.7 - 7.7 K/uL   Lymphocytes Relative 16  12 - 46 %   Lymphs Abs 1.5  0.7 - 4.0 K/uL   Monocytes Relative 8  3 - 12 %   Monocytes Absolute 0.8  0.1 - 1.0 K/uL   Eosinophils Relative 4  0 - 5 %   Eosinophils Absolute 0.4  0.0 - 0.7 K/uL   Basophils Relative 1  0 - 1 %   Basophils Absolute 0.1  0.0 - 0.1 K/uL  CHOLESTEROL, TOTAL     Status: None   Collection Time    03/25/12  4:30 AM      Result Value Range   Cholesterol 167  0 - 200 mg/dL  TRIGLYCERIDES     Status: None   Collection Time    03/25/12  4:30 AM      Result Value Range   Triglycerides 117  <150 mg/dL     Studies/Results: @RISRSLT24 @  . antiseptic oral rinse  15 mL Mouth Rinse q12n4p  . budesonide-formoterol  1 puff Inhalation BID  . chlorhexidine  15 mL Mouth Rinse BID  . enoxaparin (LOVENOX) injection  40 mg Subcutaneous Q24H  . furosemide  20 mg Intravenous Daily  . insulin aspart  0-15 Units Subcutaneous Q6H  . metoprolol  10 mg Intravenous Q6H  . ondansetron (ZOFRAN)  IV  8 mg Intravenous Q6H  . pantoprazole (PROTONIX) IV  40 mg Intravenous Q24H  . tiotropium  18 mcg Inhalation Daily     Assessment/Plan: s/p Procedure(s): COLON RESECTION SIGMOID COLOSTOMY  POD #11. Hartmann's resection, left colectomy and colostomy, for acute sigmoid diverticulitis.  Seems to be having more semi solid stool now, will advance diet to soft  Deconditioned. Continue OT and PT. We'll likely need SNF/rehabilitation. Disposition per medicine service, had long talk with her today about rehab and the importance of aggressive PT/OT to get back to independent.  Seems open to this and will speak with her son about looking into some of the facilities.  Told patient staff here is available to help as well with discharge planning  Protein calorie malnutrition   COPD  Chronic diastolic CHF   LOS: 25 days    WHITE, [facility] Office:   [phone]   03/25/2012

## 2012-03-26 LAB — COMPREHENSIVE METABOLIC PANEL
AST: 23 U/L (ref 0–37)
Albumin: 2.4 g/dL — ABNORMAL LOW (ref 3.5–5.2)
Calcium: 9.4 mg/dL (ref 8.4–10.5)
Creatinine, Ser: 0.78 mg/dL (ref 0.50–1.10)
Total Protein: 6.2 g/dL (ref 6.0–8.3)

## 2012-03-26 LAB — CBC
Hemoglobin: 9.7 g/dL — ABNORMAL LOW (ref 12.0–15.0)
MCH: 29.8 pg (ref 26.0–34.0)
MCV: 91.1 fL (ref 78.0–100.0)
RBC: 3.25 MIL/uL — ABNORMAL LOW (ref 3.87–5.11)

## 2012-03-26 LAB — GLUCOSE, CAPILLARY
Glucose-Capillary: 144 mg/dL — ABNORMAL HIGH (ref 70–99)
Glucose-Capillary: 144 mg/dL — ABNORMAL HIGH (ref 70–99)

## 2012-03-26 MED ORDER — OXYCODONE HCL 5 MG PO TABS: 5.0000 mg | ORAL_TABLET | Freq: Four times a day (QID) | ORAL | Status: DC | PRN

## 2012-03-26 MED ORDER — FUROSEMIDE 40 MG PO TABS: 20.0000 mg | ORAL_TABLET | Freq: Every day | ORAL | Status: DC

## 2012-03-26 MED ORDER — PANTOPRAZOLE SODIUM 40 MG PO TBEC: 40.0000 mg | DELAYED_RELEASE_TABLET | Freq: Every day | ORAL | Status: DC

## 2012-03-26 MED ORDER — METOPROLOL TARTRATE 25 MG PO TABS: 25.0000 mg | ORAL_TABLET | Freq: Two times a day (BID) | ORAL | Status: DC

## 2012-03-26 MED ORDER — PANTOPRAZOLE SODIUM 40 MG PO TBEC
40.0000 mg | DELAYED_RELEASE_TABLET | Freq: Every day | ORAL | Status: DC
  Administered 2012-03-26 – 2012-03-27 (×2): 40 mg via ORAL
  Filled 2012-03-26 (×2): qty 1

## 2012-03-26 MED ORDER — OXYCODONE HCL 5 MG PO TABS
5.0000 mg | ORAL_TABLET | Freq: Four times a day (QID) | ORAL | Status: DC | PRN
  Administered 2012-03-26 – 2012-03-27 (×3): 10 mg via ORAL
  Filled 2012-03-26 (×3): qty 2

## 2012-03-26 MED ORDER — ONDANSETRON HCL 4 MG PO TABS: 4.0000 mg | ORAL_TABLET | Freq: Three times a day (TID) | ORAL | Status: DC | PRN

## 2012-03-26 MED ORDER — ENSURE COMPLETE PO LIQD
237.0000 mL | Freq: Two times a day (BID) | ORAL | Status: DC
  Administered 2012-03-27 (×2): 237 mL via ORAL

## 2012-03-26 MED ORDER — FUROSEMIDE 20 MG PO TABS
20.0000 mg | ORAL_TABLET | Freq: Every day | ORAL | Status: DC
  Administered 2012-03-26 – 2012-03-27 (×2): 20 mg via ORAL
  Filled 2012-03-26 (×2): qty 1

## 2012-03-26 MED ORDER — METOPROLOL TARTRATE 25 MG PO TABS
25.0000 mg | ORAL_TABLET | Freq: Two times a day (BID) | ORAL | Status: DC
  Administered 2012-03-26 (×2): 25 mg via ORAL
  Filled 2012-03-26 (×4): qty 1

## 2012-03-26 NOTE — Progress Notes (Signed)
NUTRITION FOLLOW UP  Intervention:   Encourage PO intake Provide Ensure Complete po BID, each supplement provides 350 kcal and 13 grams of protein. Provide Multivitamin with minerals  TPN currently running; to be discontinued tonight  Nutrition Dx:   Inadequate oral intake related to inability to eat as evidenced by NPO status; discontinued  New Nutrition Dx: Inadequate oral intake related to poor appetite as evidenced by pt eating 0-25% of meals.  Goal:   Pt to meet >/= 90% of their estimated nutrition needs; being met  Monitor:   Po intake; 0-25% of meals Wt; 2 lb wt loss from 3/18 to 3/25  Assessment:   79 y.o. year-old female with history of likely ischemic LVEF 30%, restrictive lung disease due to elevated diaphragm, COPD, GERD, endocarditis, recurrent falls, recurrent UTIs, and hx of diverticulitis who presents with abdominal pain, N/V and fatigue.  3/11: Per nursing rounds this morning pt has chosen to have surgery if possible. Pt states that she doesn't feel well but, she still feels hungry. TPN is at goal rate. Will follow-up with pt after potential surgery.  3/18 Pt had colon resection with colostomy on 3/13, now with post-op ileus. Pt has TPN running at goal rate of 75 ml/hr which is meeting calorie and protein needs. Pt had a 1 lb wt gain in past week. Per RN pt is giving up; pt is allowed to have sips of clear liquids as of 3/17 but has been refusing per RN. Pt had 50-60 ml po yesterday and some ice chips today. Pt was asleep at time of visit and fell back asleep after being awakened. Will re-attempt to speak with pt tomorrow. Pt in need of new colostomy education prior to discharge. 3/25 Pt is tolerating regular diet but, reports having no desire to eat and only consuming 0-25% of meals. Pt reports her ostomy bag is full; ileus resolving. Pt denies nausea/vomiting. TPN still running but, to be discontinued tonight per rounds.  Height: Ht Readings from Last 1 Encounters:   03/15/12 5' (1.524 m)    Weight Status:   Wt Readings from Last 1 Encounters:  03/26/12 137 lb 6.4 oz (62.324 kg)    Re-estimated needs:  Kcal: 7425-9563  Protein: 75-88 grams  Fluid: 2 L  Skin: abdominal incision; generalized edema; lacerations on right and left leg-some tissue loss  Diet Order: General   Intake/Output Summary (Last 24 hours) at 03/26/12 1636 Last data filed at 03/26/12 1430  Gross per 24 hour  Intake   1400 ml  Output   1350 ml  Net     50 ml    Last BM: 3/22   Labs:   Recent Labs Lab 03/21/12 0420  03/24/12 0500 03/25/12 0430 03/26/12 0508  NA 139  < > 137 137 137  K 4.7  < > 4.2 4.0 4.1  CL 104  < > 103 103 103  CO2 28  < > 29 29 27   BUN 34*  < > 32* 31* 29*  CREATININE 0.88  < > 0.73 0.77 0.78  CALCIUM 9.5  < > 9.2 9.2 9.4  MG 2.1  --   --  2.0  --   PHOS 3.2  --   --  2.9  --   GLUCOSE 85  < > 124* 109* 124*  < > = values in this interval not displayed.  CBG (last 3)   Recent Labs  03/25/12 1709 03/25/12 2327 03/26/12 1155  GLUCAP 145* 144* 128*  Scheduled Meds: . antiseptic oral rinse  15 mL Mouth Rinse q12n4p  . budesonide-formoterol  1 puff Inhalation BID  . chlorhexidine  15 mL Mouth Rinse BID  . enoxaparin (LOVENOX) injection  40 mg Subcutaneous Q24H  . furosemide  20 mg Oral Daily  . insulin aspart  0-15 Units Subcutaneous Q6H  . metoprolol tartrate  25 mg Oral BID  . pantoprazole  40 mg Oral Daily  . tiotropium  18 mcg Inhalation Daily    Continuous Infusions: . sodium chloride 20 mL/hr (03/26/12 1026)    Ian Malkin RD, LDN Inpatient Clinical Dietitian Pager: [phone] After Hours Pager: [phone]

## 2012-03-26 NOTE — Discharge Summary (Signed)
Physician Discharge Summary  Anneta Rounds ZOX:[id number] DOB: 1927/12/10 DOA: 02/29/2012  PCP: Michele Mcalpine, MD  Admit date: 02/29/2012 Discharge date: 03/26/2012  Recommendations for Outpatient Follow-up:  1. Pt will need to follow up with PCP in 2-3 weeks post discharge 2. Please obtain BMP to evaluate electrolytes and kidney function 3. Please also check CBC to evaluate Hg and Hct levels 4. Please note that patient's dose of Lasix was decreased from 40 mg tablet by mouth daily to 20 mg tablet by mouth daily 5. In addition, please note that metoprolol 25 mg tablet twice a day has been added to patient's medical regimen 6. Please note that lisinopril was stopped during the hospital stay and can be resumed upon discharge if blood pressure allows 7. Please note that MS Contin and MSIR have been discontinued from patient medical regimen do to ileus and small bowel obstruction 8. Please note that bisoprolol was discontinued as patient was started on metoprolol  Discharge Diagnoses: Abdominal pain secondary to ileus, acute sigmoid diverticulitis Principal Problem:   Diverticulitis Active Problems:   UTI (lower urinary tract infection)   Chronic respiratory failure with hypoxia   Diastolic CHF, chronic   Pancreatic mass   Anemia   Wound of left leg   Leukocytosis, unspecified   Mitral regurgitation   Ileus, postoperative  Discharge Condition: Stable  Diet recommendation: Heart healthy diet discussed in details   Brief narrative:  Pt is 79 year old female with complex medical history including diverticulitis, ischemic cardiomyopathy (EF 30%), perimyocarditis, COPD, endocarditis, recurrent falls, who presented to [facility] ED 02/29/2012 due to abdominal pain, nausea and vomiting. In ED, CT abd/pelvis consistent with sigmoid diverticulitis worse compared to previous imaging studies. Surgery and GI following. Pt is now s/p L colectomy and colostomy on 3/13 and with subsequent development of  post op ileus.   Assessment and Plan:  Principal Problem:  *Acute Sigmoid Diverticulitis  Status post left colectomy and colostomy placement, post op day #12 Advanced to full liquid diet, OOB and ambulation, emphasized importance of continuing physical therapy  Slow overall recovery due to significant deconditioning, SNF recommended and family in agreement  Hyperkalemia: corrected  Stable, BMP in AM Hyperglycemia  Stable, HBAIC is 5.4  UTI (lower urinary tract infection)  Secondary to E.Coli, Patient completed 5 days of ceftriaxone.  Chronic respiratory failure with hypoxia /COPD  Stable, pt maintaining oxygen saturations at target range, continue symbicort, Albuterol Q 6 hours PRN provided and pt has responded well Chronic Diastolic CHF :  Continue metoprolol 25 mg twice a day,  renal function stable  Lasix 20mg  daily Bilateral lower extremity ulcers  Stable, daily dressing changes Severe protein calorie malnutrition  On TNA for most of the part during the hospital stay, patient slowly advanced to full liquid diet and regular diet, so far tolerating well Anemia of chronic disease   Hemoglobin and hematocrit stable and at pt's baseline  Consultants:  Dr. Arlyce Dice (GI) --> signed off  Dr. Ezzard Standing (Surgery)  Dr. Molli Knock [facility]) --> signed off  Dr. Excell Seltzer (Cardiology) --> signed off  Procedures:  Left colectomy and colostomy 03/14/2012 by Dr. Carolynne Edouard  Antibiotics:  Fluconazole 03/10/2012 -->3/15  Zosyn 03/10/2012 -->3/15  Flagyl 03/12/2012 -->3/14  Code Status: DNR  Family Communication: Son updated over the phone, spent over 30 minutes in discussion with current progress and recommendation to discharge to rehabilitation facility likely in the morning, the son has agreed with plan  Discharge Exam: Filed Vitals:   03/26/12 1430  BP:  109/53  Pulse: 85  Temp: 98 F (36.7 C)  Resp: 24   Filed Vitals:   03/26/12 0445 03/26/12 1054 03/26/12 1236 03/26/12 1430  BP: 125/69   109/53   Pulse: 90   85  Temp: 98.6 F (37 C)   98 F (36.7 C)  TempSrc: Oral   Oral  Resp: 22   24  Height:      Weight:    137 lb 6.4 oz (62.324 kg)  SpO2: 99% 96% 90% 100%    General: Pt is alert, follows commands appropriately, not in acute distress, frail and chronically ill  Cardiovascular: Regular rate and rhythm, S1/S2 +, no murmurs, no rubs, no gallops Respiratory: Clear to auscultation bilaterally, no wheezing, no crackles, no rhonchi Abdominal: Soft, non tender, non distended, bowel sounds +, no guarding, good ostomy output  Extremities: no edema, no cyanosis, pulses palpable bilaterally DP and PT Neuro: Grossly nonfocal  Discharge Instructions   Future Appointments Provider Department Dept Phone   04/17/2012 3:00 PM Michele Mcalpine, MD [facility] [phone]   07/01/2012 3:30 PM York Spaniel, MD [facility] [phone]       Medication List    STOP taking these medications       ALEVE 220 MG Caps  Generic drug:  Naproxen Sodium     lisinopril 5 MG tablet  Commonly known as:  PRINIVIL,ZESTRIL     morphine 15 MG 12 hr tablet  Commonly known as:  MS CONTIN     morphine 15 MG tablet  Commonly known as:  MSIR    bisoprolol 5 MG tablet  Commonly known as:  ZEBETA  Take 2.5 mg by mouth every morning.     TAKE these medications       ALIGN 4 MG Caps  Take 1 capsule by mouth every morning.     aspirin 81 MG EC tablet  Take 81 mg by mouth every morning.     budesonide-formoterol 160-4.5 MCG/ACT inhaler  Commonly known as:  SYMBICORT  Inhale 1 puff into the lungs 2 (two) times daily.     CALTRATE 600+D PLUS 600-400 MG-UNIT per tablet  Chew 1 tablet by mouth 2 (two) times daily.     cholecalciferol 1000 UNITS tablet  Commonly known as:  VITAMIN D  Take 2,000 Units by mouth every morning.     citalopram 20 MG tablet  Commonly known as:  CELEXA  Take 20 mg by mouth every morning.     ferrous sulfate 325 (65 FE) MG tablet   Take 325 mg by mouth 2 (two) times daily.     furosemide 40 MG tablet  Commonly known as:  LASIX  Take 0.5 tablets (20 mg total) by mouth daily.     loratadine 10 MG tablet  Commonly known as:  CLARITIN  Take 10 mg by mouth every morning.     metoprolol tartrate 25 MG tablet  Commonly known as:  LOPRESSOR  Take 1 tablet (25 mg total) by mouth 2 (two) times daily.     multivitamin with minerals Tabs  Take 1 tablet by mouth every morning.     ondansetron 4 MG disintegrating tablet  Commonly known as:  ZOFRAN-ODT  Take 4 mg by mouth every 8 (eight) hours as needed for nausea.     oxyCODONE 5 MG immediate release tablet  Commonly known as:  Oxy IR/ROXICODONE  Take 1-2 tablets (5-10 mg total) by mouth every 6 (six) hours as needed.  pantoprazole 40 MG tablet  Commonly known as:  PROTONIX  Take 1 tablet (40 mg total) by mouth daily.     spironolactone 25 MG tablet  Commonly known as:  ALDACTONE  Take 12.5 mg by mouth every morning.     tiotropium 18 MCG inhalation capsule  Commonly known as:  SPIRIVA  Place 18 mcg into inhaler and inhale 2 (two) times daily.     vitamin C 500 MG tablet  Commonly known as:  ASCORBIC ACID  Take 500 mg by mouth 2 (two) times daily.     zinc gluconate 50 MG tablet  Take 100 mg by mouth every morning.           Follow-up Information   Follow up with NADEL,SCOTT M, MD In 2 weeks.   Contact information:   [address] [place] [address] [phone]        The results of significant diagnostics from this hospitalization (including imaging, microbiology, ancillary and laboratory) are listed below for reference.     Microbiology: No results found for this or any previous visit (from the past 240 hour(s)).   Labs: Basic Metabolic Panel:  Recent Labs Lab 03/21/12 0420 03/22/12 0524 03/23/12 0500 03/24/12 0500 03/25/12 0430 03/26/12 0508  NA 139 137 134* 137 137 137  K 4.7 4.3 4.1 4.2 4.0 4.1  CL 104 103 99 103 103  103  CO2 28 29 26 29 29 27   GLUCOSE 85 114* 143* 124* 109* 124*  BUN 34* 33* 35* 32* 31* 29*  CREATININE 0.88 0.83 0.79 0.73 0.77 0.78  CALCIUM 9.5 9.2 9.2 9.2 9.2 9.4  MG 2.1  --   --   --  2.0  --   PHOS 3.2  --   --   --  2.9  --    Liver Function Tests:  Recent Labs Lab 03/22/12 0524 03/23/12 0500 03/24/12 0500 03/25/12 0430 03/26/12 0508  AST 31 97* 36 34 23  ALT 44* 125* 79* 70* 54*  ALKPHOS 116 146* 130* 124* 127*  BILITOT 0.2* 0.2* 0.2* 0.3 0.2*  PROT 5.6* 5.7* 5.6* 5.7* 6.2  ALBUMIN 2.1* 2.2* 2.2* 2.2* 2.4*   No results found for this basename: LIPASE, AMYLASE,  in the last 168 hours No results found for this basename: AMMONIA,  in the last 168 hours CBC:  Recent Labs Lab 03/22/12 0524 03/23/12 0500 03/24/12 0500 03/25/12 0430 03/26/12 0508  WBC 10.8* 10.0 9.3 9.8 13.0*  NEUTROABS  --   --   --  7.1  --   HGB 8.8* 8.6* 8.5* 8.3* 9.7*  HCT 27.8* 27.3* 26.2* 26.2* 29.6*  MCV 91.7 91.6 91.0 91.6 91.1  PLT 454* 453* 418* 414* 472*   Cardiac Enzymes: No results found for this basename: CKTOTAL, CKMB, CKMBINDEX, TROPONINI,  in the last 168 hours BNP: BNP (last 3 results)  Recent Labs  11/03/11 0628 03/05/12 0450 03/10/12 0505  PROBNP 1925.0* 8790.0* 278.1   CBG:  Recent Labs Lab 03/25/12 1145 03/25/12 1709 03/25/12 2327 03/26/12 1155 03/26/12 1633  GLUCAP 152* 145* 144* 128* 107*     SIGNED: Time coordinating discharge: Over 30 minutes  Debbora Presto, MD  [facility] 03/26/2012, 4:57 PM Pager [phone]  If 7PM-7AM, please contact night-coverage [email or web address] Password [email or web address]

## 2012-03-26 NOTE — Progress Notes (Signed)
Occupational Therapy Treatment Patient Details Name: Katie Galvan MRN: [id number] DOB: September 09, 1927 Today's Date: 03/26/2012 Time: 1478-2956 OT Time Calculation (min): 32 min  OT Assessment / Plan / Recommendation Comments on Treatment Session Pt continuing to progress slowly with selfcare tasks and functional transfers.  Still feel she will need SNF for follow-up rehab post acute stay.    Follow Up Recommendations  SNF       Equipment Recommendations  None recommended by OT       Frequency Min 2X/week   Plan Discharge plan remains appropriate    Precautions / Restrictions Precautions Precautions: Fall Precaution Comments: colostomy Restrictions Weight Bearing Restrictions: No   Pertinent Vitals/Pain O2 sats decreased to 89% on room air with activity, remain at 96% on 2Ls nasal cannula at rest.    ADL  Grooming: Performed;Minimal assistance Where Assessed - Grooming: Supported standing Toilet Transfer: Mining engineer Method: Sit to Barista: Other (comment) (To bedside chair, pt declined need to toilet) Toileting - Clothing Manipulation and Hygiene: Simulated;Minimal assistance Where Assessed - Engineer, mining and Hygiene: Other (comment) (sit to stand from EOB) Equipment Used: Rolling walker Transfers/Ambulation Related to ADLs: Pt able to perform mobility with min assist using the RW.  Exhibits forward trunk flexion and kyphosis in standing. ADL Comments: Pt still with limited endurance.  Needs mod facilitation for supine to sit and min assist for sit to stand from lower surfaces.  Able to tolerate standing at the sink while brushing her teeth for 3 mins before needing to sit and rest.  O2 sats 89% after returning to sitting position on room air.  Discussed AE for LB selfcare tasks.  Pt reports having a reacher and sockaide already at home.      OT Goals Acute Rehab OT Goals Time For Goal Achievement:  04/09/12 ADL Goals Pt Will Perform Grooming: with supervision;Standing at sink;Other (comment) (3 tasks for at least 5 mins) ADL Goal: Grooming - Progress: Updated due to goal met Pt Will Transfer to Toilet: with supervision;3-in-1;with DME;Ambulation ADL Goal: Toilet Transfer - Progress: Updated due to goal met Pt Will Perform Toileting - Hygiene: with supervision;Sit to stand from 3-in-1/toilet ADL Goal: Toileting - Hygiene - Progress: Goal set today Miscellaneous OT Goals Miscellaneous OT Goal #1: pt will complete UB adls with occasional min A from supported sitting  OT Goal: Miscellaneous Goal #1 - Progress: Revised due to lack of progress Miscellaneous OT Goal #2: pt will verbalize 2 energy conservation techniques OT Goal: Miscellaneous Goal #2 - Progress: Revised due to lack of progress  Visit Information  Last OT Received On: 03/26/12 Assistance Needed: +1    Subjective Data  Subjective: Ok what do you want to do. Patient Stated Goal: Pt did not state      Cognition  Cognition Overall Cognitive Status: Appears within functional limits for tasks assessed/performed Arousal/Alertness: Awake/alert Orientation Level: Appears intact for tasks assessed Behavior During Session: [facility] for tasks performed    Mobility  Bed Mobility Bed Mobility: Sit to Supine Supine to Sit: 3: Mod assist;HOB elevated;With rails Transfers Transfers: Sit to Stand Sit to Stand: 4: Min assist;With upper extremity assist;From bed Stand to Sit: 4: Min assist;With upper extremity assist;To chair/3-in-1       Balance Balance Balance Assessed: Yes Dynamic Standing Balance Dynamic Standing - Balance Support: Bilateral upper extremity supported Dynamic Standing - Level of Assistance: 4: Min assist   End of Session OT - End of Session Equipment Utilized During Treatment:  Gait belt Activity Tolerance: Patient limited by fatigue Patient left: in chair;with call bell/phone within reach Nurse  Communication: Mobility status     Yahye Siebert OTR/L Pager number [phone] 03/26/2012, 12:49 PM

## 2012-03-26 NOTE — Progress Notes (Signed)
TNA - Discontinuation  MD ordered to discontinue TNA today as patient is tolerating diet well. Patient has insulin in TNA so would favor tapering TNA off slowly to prevent large changes in CBGs. Notified RN Delorise Shiner) to cut TNA down to 30 ml/hr then to off at [facility] today.  Pharmacy will d/c all TNA related labs.  Will defer to MD for IVF adjustment and management of CBGs.   Geoffry Paradise, PharmD, BCPS Pager: [phone] 9:20 AM Pharmacy #: [phone]

## 2012-03-26 NOTE — Progress Notes (Signed)
Physical Therapy Treatment Patient Details Name: Katie Galvan MRN: [id number] DOB: April 19, 1927 Today's Date: 03/26/2012 Time: 8295-6213 PT Time Calculation (min): 23 min  PT Assessment / Plan / Recommendation Comments on Treatment Session  Pt ambulated in hallway today same distance as yesterday.    Follow Up Recommendations  SNF     Does the patient have the potential to tolerate intense rehabilitation     Barriers to Discharge        Equipment Recommendations  Rolling walker with 5" wheels    Recommendations for Other Services    Frequency     Plan Discharge plan remains appropriate;Frequency remains appropriate    Precautions / Restrictions Precautions Precautions: Fall Precaution Comments: colostomy Restrictions Weight Bearing Restrictions: No   Pertinent Vitals/Pain SaO2 97% on 2L O2 [place] upon return to supine after session    Mobility  Bed Mobility Bed Mobility: Sit to Supine;Supine to Sit Supine to Sit: 4: Min assist Sit to Supine: 5: Supervision Details for Bed Mobility Assistance: assist for trunk upright Transfers Transfers: Sit to Stand;Stand to Sit Sit to Stand: 4: Min guard;With upper extremity assist;From chair/3-in-1;From bed Stand to Sit: 4: Min guard;With upper extremity assist;To chair/3-in-1;To bed Details for Transfer Assistance: able to recall safe technique, performed multiple times for rest breaks during ambulation Ambulation/Gait Ambulation/Gait Assistance: 4: Min guard Ambulation Distance (Feet): 60 Feet Assistive device: Rolling walker Ambulation/Gait Assistance Details: +2 for chair to follow as pt required 2 seated rest breaks, ambulated on 2L O2 [place], verbal cues for posture Gait Pattern: Step-through pattern;Decreased stride length;Trunk flexed Gait velocity: very slow speed    Exercises General Exercises - Lower Extremity Ankle Circles/Pumps: AROM;Both;15 reps;Seated Long Arc Quad: AROM;Both;15 reps;Seated   PT Diagnosis:    PT  Problem List:   PT Treatment Interventions:     PT Goals Acute Rehab PT Goals Pt will go Supine/Side to Sit: with supervision PT Goal: Supine/Side to Sit - Progress: Progressing toward goal Pt will go Sit to Stand: with supervision PT Goal: Sit to Stand - Progress: Progressing toward goal Pt will go Stand to Sit: with supervision PT Goal: Stand to Sit - Progress: Progressing toward goal Pt will Ambulate: with least restrictive assistive device;51 - 150 feet;with supervision PT Goal: Ambulate - Progress: Progressing toward goal Pt will Perform Home Exercise Program: with supervision, verbal cues required/provided PT Goal: Perform Home Exercise Program - Progress: Progressing toward goal  Visit Information  Last PT Received On: 03/26/12 Assistance Needed: +2 (chair following)    Subjective Data  Subjective: I was up with that man earlier today; we brushed my teeth and I sat up in the chair.  (OT session this AM)   Cognition  Cognition Overall Cognitive Status: Appears within functional limits for tasks assessed/performed Arousal/Alertness: Awake/alert Orientation Level: Appears intact for tasks assessed Behavior During Session: [facility] for tasks performed    Balance  Balance Balance Assessed: Yes Dynamic Standing Balance Dynamic Standing - Balance Support: Bilateral upper extremity supported Dynamic Standing - Level of Assistance: 4: Min assist  End of Session PT - End of Session Equipment Utilized During Treatment: Oxygen Activity Tolerance: Patient limited by fatigue Patient left: in bed;with call bell/phone within reach   GP     Amos Gaber,[name] 03/26/2012, 4:03 PM Zenovia Jarred, PT, DPT 03/26/2012 Pager: [phone]

## 2012-03-26 NOTE — Progress Notes (Signed)
Tolerating diet; Wean off TNA Will remove staples prior to discharge. Disposition per medicine service - SNF  Wilmon Arms. Corliss Skains, MD, [facility]  03/26/2012 12:44 PM

## 2012-03-26 NOTE — Progress Notes (Signed)
Patient ID: Katie Galvan, female   DOB: [birth year], 79 y.o.   MRN: [id number] 12 Days Post-Op  Subjective: No new complaints today, abd sore but not changed, tolerating diet well, denies n/v.   Objective: Vital signs in last 24 hours: Temp:  [98.6 F (37 C)-98.7 F (37.1 C)] 98.6 F (37 C) (03/25 0445) Pulse Rate:  [89-90] 90 (03/25 0445) Resp:  [18-22] 22 (03/25 0445) BP: (100-125)/(51-69) 125/69 mmHg (03/25 0445) SpO2:  [97 %-100 %] 99 % (03/25 0445) Last BM Date: 03/23/12  Intake/Output from previous day: 03/24 0701 - 03/25 0700 In: 2040 [P.O.:120; I.V.:420; IV Piggyback:150; TPN:1350] Out: 1500 [Urine:1500] Intake/Output this shift:    General: Alert. Good spirits. No distress. GI: abdomen soft. Minimally tender. non distended. Midline incision clean with staples. Good output from ostomy now, +BS  Lab Results:  Results for orders placed during the hospital encounter of 02/29/12 (from the past 24 hour(s))  GLUCOSE, CAPILLARY     Status: Abnormal   Collection Time    03/25/12 11:45 AM      Result Value Range   Glucose-Capillary 152 (*) 70 - 99 mg/dL  GLUCOSE, CAPILLARY     Status: Abnormal   Collection Time    03/25/12  5:09 PM      Result Value Range   Glucose-Capillary 145 (*) 70 - 99 mg/dL  GLUCOSE, CAPILLARY     Status: Abnormal   Collection Time    03/25/12 11:27 PM      Result Value Range   Glucose-Capillary 144 (*) 70 - 99 mg/dL  COMPREHENSIVE METABOLIC PANEL     Status: Abnormal   Collection Time    03/26/12  5:08 AM      Result Value Range   Sodium 137  135 - 145 mEq/L   Potassium 4.1  3.5 - 5.1 mEq/L   Chloride 103  96 - 112 mEq/L   CO2 27  19 - 32 mEq/L   Glucose, Bld 124 (*) 70 - 99 mg/dL   BUN 29 (*) 6 - 23 mg/dL   Creatinine, Ser 4.09  0.50 - 1.10 mg/dL   Calcium 9.4  8.4 - 81.1 mg/dL   Total Protein 6.2  6.0 - 8.3 g/dL   Albumin 2.4 (*) 3.5 - 5.2 g/dL   AST 23  0 - 37 U/L   ALT 54 (*) 0 - 35 U/L   Alkaline Phosphatase 127 (*) 39 - 117  U/L   Total Bilirubin 0.2 (*) 0.3 - 1.2 mg/dL   GFR calc non Af Amer 76 (*) >90 mL/min   GFR calc Af Amer 88 (*) >90 mL/min  CBC     Status: Abnormal   Collection Time    03/26/12  5:08 AM      Result Value Range   WBC 13.0 (*) 4.0 - 10.5 K/uL   RBC 3.25 (*) 3.87 - 5.11 MIL/uL   Hemoglobin 9.7 (*) 12.0 - 15.0 g/dL   HCT 91.4 (*) 78.2 - 95.6 %   MCV 91.1  78.0 - 100.0 fL   MCH 29.8  26.0 - 34.0 pg   MCHC 32.8  30.0 - 36.0 g/dL   RDW 21.3 (*) 08.6 - 57.8 %   Platelets 472 (*) 150 - 400 K/uL     Studies/Results: @RISRSLT24 @  . antiseptic oral rinse  15 mL Mouth Rinse q12n4p  . budesonide-formoterol  1 puff Inhalation BID  . chlorhexidine  15 mL Mouth Rinse BID  . enoxaparin (LOVENOX) injection  40 mg  Subcutaneous Q24H  . furosemide  20 mg Intravenous Daily  . insulin aspart  0-15 Units Subcutaneous Q6H  . metoprolol  10 mg Intravenous Q6H  . ondansetron (ZOFRAN) IV  8 mg Intravenous Q6H  . pantoprazole (PROTONIX) IV  40 mg Intravenous Q24H  . tiotropium  18 mcg Inhalation Daily     Assessment/Plan: s/p Procedure(s): COLON RESECTION SIGMOID COLOSTOMY  POD #12. Hartmann's resection, left colectomy and colostomy, for acute sigmoid diverticulitis.  Good output now, advance to regular diet, ok to go to rehab when bed available, staples out at POD#14  Deconditioned. Continue OT and PT. We'll likely need SNF/rehabilitation. Disposition per medicine service, had long talk with her today about rehab and the importance of aggressive PT/OT to get back to independent.  She will speak with her son about looking into some of the facilities.    Protein calorie malnutrition: tolerating diet well, will d/c TNA  COPD  Chronic diastolic CHF   LOS: 26 days    Alastor Kneale, [facility] Office:   [phone]   03/26/2012

## 2012-03-26 NOTE — Discharge Instructions (Addendum)
Ileus The intestine (bowel, or gut) is a long muscular tube connecting your stomach to your rectum. If the intestine stops working, food cannot pass through. This is called an ileus. This can happen for a variety of reasons. Ileus is a major medical problem that usually requires hospitalization. If your intestine stops working because of a blockage, this is called a bowel obstruction, and is a different condition. CAUSES   Surgery in your abdomen. This can last from a few hours to a few days.  An infection or inflammation in the belly (abdomen). This includes inflammation of the lining of the abdomen (peritonitis).  Infection or inflammation in other parts of the body, such as pneumonia or pancreatitis.  Passage of gallstones or kidney stones.  Damage to the nerves or blood vessels which go to the bowel.  Imbalance in the salts in the blood (electrolytes).  Injury to the brain and or spinal cord.  Medications. Many medications can cause ileus or make it worse. The most common of these are strong pain medications. SYMPTOMS  Symptoms of bowel obstruction come from the bowel inactivity. They may include:  Bloating. Your belly gets bigger (distension).  Pain or discomfort in the abdomen.  Poor appetite, feeling sick to your stomach (nausea) and vomiting.  You may also not be able to hear your normal bowel sounds, such as "growling" in your stomach. DIAGNOSIS   Your history and a physical exam will usually suggest to your caregiver that you have an ileus.  X-rays or a CT scan of your abdomen will confirm the diagnosis. X-rays, CT scans and lab tests may also suggest the cause. TREATMENT   Rest the intestine until it starts working again. This is most often accomplished by:  Stopping intake of oral food and drink. Dehydration is prevented by using IV (intravenous) fluids.  Sometimes, a naso-gastric tube (NG tube) is needed. This is a narrow plastic tube inserted through your nose  and into your stomach. It is connected to suction to keep the stomach emptied out. This also helps treat the nausea and vomiting.  If there is an imbalance in the electrolytes, they are corrected with supplements in your intravenous fluids.  Medications that might make an ileus worse might be stopped.  There are no medications that reliably treat ileus, though your caregiver may suggest a trial of certain medications.  If your condition is slow to resolve, you will be re-evaluated to be sure another condition, such as a blockage, is not present. Ileus is common and usually has a good outcome. Depending on cause of your ileus, it usually can be treated by your caregivers with good results. Sometimes, specialists (surgeons or gastroenterologists) are asked to assist in your care.  HOME CARE INSTRUCTIONS   Follow your caregiver's instructions regarding diet and fluid intake. This will usually include drinking plenty of clear fluids, avoiding alcohol and caffeine, and eating a gentle diet.  Follow your caregiver's instructions regarding activity. A period of rest is sometimes advised before returning to work or school.  Take only medications prescribed by your caregiver. Be especially careful with narcotic pain medication, which can slow your bowel activity and contribute to ileus.  Keep any follow-up appointments with your caregiver or specialists. SEEK MEDICAL CARE IF:   You have a recurrence of nausea, vomiting or abdominal discomfort.  You develop fever of more than 102 F (38.9 C). SEEK IMMEDIATE MEDICAL CARE IF:   You have severe abdominal pain.  You are unable to keep  fluids down. Document Released: 12/22/2002 Document Revised: 03/13/2011 Document Reviewed: 04/23/2008 [facility] Patient Information 2013 [place], [place].   CCS      [facility], [place] [phone]  OPEN ABDOMINAL SURGERY: POST OP INSTRUCTIONS  Always review your discharge instruction sheet given to you  by the facility where your surgery was performed.  IF YOU HAVE DISABILITY OR FAMILY LEAVE FORMS, YOU MUST BRING THEM TO THE OFFICE FOR PROCESSING.  PLEASE DO NOT GIVE THEM TO YOUR DOCTOR.  1. A prescription for pain medication may be given to you upon discharge.  Take your pain medication as prescribed, if needed.  If narcotic pain medicine is not needed, then you may take acetaminophen (Tylenol) or ibuprofen (Advil) as needed. 2. Take your usually prescribed medications unless otherwise directed. 3. If you need a refill on your pain medication, please contact your pharmacy. They will contact our office to request authorization.  Prescriptions will not be filled after 5pm or on week-ends. 4. You should follow a light diet the first few days after arrival home, such as soup and crackers, pudding, etc.unless your doctor has advised otherwise. A high-fiber, low fat diet can be resumed as tolerated.   Be sure to include lots of fluids daily. Most patients will experience some swelling and bruising on the chest and neck area.  Ice packs will help.  Swelling and bruising can take several days to resolve 5. Most patients will experience some swelling and bruising in the area of the incision. Ice pack will help. Swelling and bruising can take several days to resolve..  6. It is common to experience some constipation if taking pain medication after surgery.  Increasing fluid intake and taking a stool softener will usually help or prevent this problem from occurring.  A mild laxative (Milk of Magnesia or Miralax) should be taken according to package directions if there are no bowel movements after 48 hours. 7.  You may have steri-strips (small skin tapes) in place directly over the incision.  These strips should be left on the skin for 7-10 days.  If your surgeon used skin glue on the incision, you may shower in 24 hours.  The glue will flake off over the next 2-3 weeks.  Any sutures or staples will be removed at  the office during your follow-up visit. You may find that a light gauze bandage over your incision may keep your staples from being rubbed or pulled. You may shower and replace the bandage daily. 8. ACTIVITIES:  You may resume regular (light) daily activities beginning the next day--such as daily self-care, walking, climbing stairs--gradually increasing activities as tolerated.  You may have sexual intercourse when it is comfortable.  Refrain from any heavy lifting or straining until approved by your doctor. a. You may drive when you no longer are taking prescription pain medication, you can comfortably wear a seatbelt, and you can safely maneuver your car and apply brakes b. Return to Work: ___________________________________ 9. You should see your doctor in the office for a follow-up appointment approximately two weeks after your surgery.  Make sure that you call for this appointment within a day or two after you arrive home to insure a convenient appointment time. OTHER INSTRUCTIONS:  _____________________________________________________________ _____________________________________________________________  WHEN TO CALL YOUR DOCTOR: 1. Fever over 101.0 2. Inability to urinate 3. Nausea and/or vomiting 4. Extreme swelling or bruising 5. Continued bleeding from incision. 6. Increased pain, redness, or drainage from the incision. 7. Difficulty swallowing or breathing 8. Muscle cramping  or spasms. 9. Numbness or tingling in hands or feet or around lips.  The clinic staff is available to answer your questions during regular business hours.  Please don't hesitate to call and ask to speak to one of the nurses if you have concerns.  For further questions, please visit [email or web address]

## 2012-03-27 DIAGNOSIS — K560 Paralytic ileus: Secondary | ICD-10-CM

## 2012-03-27 DIAGNOSIS — K929 Disease of digestive system, unspecified: Secondary | ICD-10-CM

## 2012-03-27 LAB — CBC
MCH: 29.5 pg (ref 26.0–34.0)
MCHC: 32.1 g/dL (ref 30.0–36.0)
MCV: 91.8 fL (ref 78.0–100.0)
Platelets: 420 10*3/uL — ABNORMAL HIGH (ref 150–400)
RDW: 17.5 % — ABNORMAL HIGH (ref 11.5–15.5)

## 2012-03-27 LAB — COMPREHENSIVE METABOLIC PANEL
ALT: 41 U/L — ABNORMAL HIGH (ref 0–35)
AST: 19 U/L (ref 0–37)
Albumin: 2.3 g/dL — ABNORMAL LOW (ref 3.5–5.2)
Alkaline Phosphatase: 113 U/L (ref 39–117)
BUN: 25 mg/dL — ABNORMAL HIGH (ref 6–23)
Chloride: 104 mEq/L (ref 96–112)
Potassium: 4 mEq/L (ref 3.5–5.1)
Sodium: 139 mEq/L (ref 135–145)
Total Bilirubin: 0.2 mg/dL — ABNORMAL LOW (ref 0.3–1.2)

## 2012-03-27 MED ORDER — METOPROLOL TARTRATE 25 MG PO TABS: 25.0000 mg | ORAL_TABLET | Freq: Two times a day (BID) | ORAL | Status: DC

## 2012-03-27 MED ORDER — FUROSEMIDE 40 MG PO TABS: 20.0000 mg | ORAL_TABLET | Freq: Every day | ORAL | Status: DC

## 2012-03-27 MED ORDER — OXYCODONE HCL 5 MG PO TABS: 5.0000 mg | ORAL_TABLET | Freq: Four times a day (QID) | ORAL | Status: DC | PRN

## 2012-03-27 MED ORDER — PANTOPRAZOLE SODIUM 40 MG PO TBEC: 40.0000 mg | DELAYED_RELEASE_TABLET | Freq: Every day | ORAL | Status: DC

## 2012-03-27 NOTE — Progress Notes (Signed)
Patient ID: Katie Galvan, female   DOB: 07-23-1927, 79 y.o.   MRN: [id number] 13 Days Post-Op  Subjective: No new complaints today, abd sore but not changed, tolerating diet well, denies n/v.   Objective: Vital signs in last 24 hours: Temp:  [97.2 F (36.2 C)-98 F (36.7 C)] 97.2 F (36.2 C) (03/26 0625) Pulse Rate:  [78-85] 78 (03/26 0625) Resp:  [16-24] 16 (03/26 0625) BP: (102-135)/(50-74) 135/74 mmHg (03/26 0625) SpO2:  [90 %-100 %] 99 % (03/26 0743) Weight:  [137 lb 5.6 oz (62.3 kg)-137 lb 6.4 oz (62.324 kg)] 137 lb 5.6 oz (62.3 kg) (03/26 0625) Last BM Date: 03/26/12  Intake/Output from previous day: 03/25 0701 - 03/26 0700 In: 1480 [P.O.:530; I.V.:480; TPN:470] Out: 1225 [Urine:1225] Intake/Output this shift:    General: Alert. Good spirits. No distress. GI: abdomen soft. Minimally tender. non distended. Midline incision clean with staples. Good output from ostomy now, +BS  Lab Results:  Results for orders placed during the hospital encounter of 02/29/12 (from the past 24 hour(s))  GLUCOSE, CAPILLARY     Status: Abnormal   Collection Time    03/26/12 11:55 AM      Result Value Range   Glucose-Capillary 128 (*) 70 - 99 mg/dL  GLUCOSE, CAPILLARY     Status: Abnormal   Collection Time    03/26/12  4:33 PM      Result Value Range   Glucose-Capillary 107 (*) 70 - 99 mg/dL  GLUCOSE, CAPILLARY     Status: Abnormal   Collection Time    03/26/12 11:53 PM      Result Value Range   Glucose-Capillary 114 (*) 70 - 99 mg/dL  COMPREHENSIVE METABOLIC PANEL     Status: Abnormal   Collection Time    03/27/12  4:00 AM      Result Value Range   Sodium 139  135 - 145 mEq/L   Potassium 4.0  3.5 - 5.1 mEq/L   Chloride 104  96 - 112 mEq/L   CO2 27  19 - 32 mEq/L   Glucose, Bld 90  70 - 99 mg/dL   BUN 25 (*) 6 - 23 mg/dL   Creatinine, Ser 5.62  0.50 - 1.10 mg/dL   Calcium 9.1  8.4 - 13.0 mg/dL   Total Protein 5.9 (*) 6.0 - 8.3 g/dL   Albumin 2.3 (*) 3.5 - 5.2 g/dL   AST 19   0 - 37 U/L   ALT 41 (*) 0 - 35 U/L   Alkaline Phosphatase 113  39 - 117 U/L   Total Bilirubin 0.2 (*) 0.3 - 1.2 mg/dL   GFR calc non Af Amer 76 (*) >90 mL/min   GFR calc Af Amer 89 (*) >90 mL/min  CBC     Status: Abnormal   Collection Time    03/27/12  4:00 AM      Result Value Range   WBC 11.9 (*) 4.0 - 10.5 K/uL   RBC 3.05 (*) 3.87 - 5.11 MIL/uL   Hemoglobin 9.0 (*) 12.0 - 15.0 g/dL   HCT 86.5 (*) 78.4 - 69.6 %   MCV 91.8  78.0 - 100.0 fL   MCH 29.5  26.0 - 34.0 pg   MCHC 32.1  30.0 - 36.0 g/dL   RDW 29.5 (*) 28.4 - 13.2 %   Platelets 420 (*) 150 - 400 K/uL     Studies/Results: @RISRSLT24 @  . antiseptic oral rinse  15 mL Mouth Rinse q12n4p  . budesonide-formoterol  1 puff  Inhalation BID  . chlorhexidine  15 mL Mouth Rinse BID  . enoxaparin (LOVENOX) injection  40 mg Subcutaneous Q24H  . feeding supplement  237 mL Oral BID BM  . furosemide  20 mg Oral Daily  . insulin aspart  0-15 Units Subcutaneous Q6H  . metoprolol tartrate  25 mg Oral BID  . pantoprazole  40 mg Oral Daily  . tiotropium  18 mcg Inhalation Daily     Assessment/Plan: s/p Procedure(s): COLON RESECTION SIGMOID COLOSTOMY  POD #13. Hartmann's resection, left colectomy and colostomy, for acute sigmoid diverticulitis.  Good output now, ok to go to rehab when bed available, staples out today.  F/U in our office in 2-3 weeks  Deconditioned. Continue OT and PT. We'll likely need SNF/rehabilitation. Disposition per medicine service, had long talk with her today about rehab and the importance of aggressive PT/OT to get back to independent.      Protein calorie malnutrition: tolerating diet well, will d/c TNA  COPD  Chronic diastolic CHF   LOS: 27 days    Shemekia Patane, [facility] Office:   [phone]   03/27/2012

## 2012-03-27 NOTE — Progress Notes (Addendum)
Submitted for blue medicare auth as patient is cleared for discharge. Blue medicare auth received for transport [employer] copied and given to nurse. CSW spoke with patient's son, [name], informed of discharge. He is aware and agreeable. ptar called for transportation.  Lynnda Wiersma C. Addysyn Fern MSW, LCSW [phone]

## 2012-03-27 NOTE — Progress Notes (Signed)
Ready for discharge.  Staples out.  Wilmon Arms. Corliss Skains, MD, [facility]  03/27/2012 1:22 PM

## 2012-03-27 NOTE — Progress Notes (Signed)
Patient discharged per MD order. Report called to [facility] and given to Earnie Larsson. PICC line and staples removed prior to discharge. Patient transported via [facility] back to facility. Patient's son, [name], called once patient left and notified of discharge per family request. Angelena Form, RN

## 2012-03-27 NOTE — Progress Notes (Signed)
[facility] PROGRESS NOTE  Katie Galvan ZOX:[id number] DOB: 1927-03-08 DOA: 02/29/2012 PCP: Michele Mcalpine, MD  Assessment/Plan: *Acute Sigmoid Diverticulitis  Status post left colectomy and colostomy placement, post op day #13  Advanced to heart healthy diet, OOB and ambulation, emphasized importance of continuing physical therapy  Slow overall recovery due to significant deconditioning, SNF recommended and family inagreement. TPN discontinued. Patient tolerating diet. General surgery following. Staples to be removed today. Follow up with general surgery as outpatient. Hyperkalemia: corrected  Resolved. Hyperglycemia  Stable, continue SSI, HBAIC is 5.4  UTI (lower urinary tract infection)  Secondary to E.Coli, Patient completed 5 days of ceftriaxone.  Chronic respiratory failure with hypoxia /COPD  Stable, pt maintaining oxygen saturations at target range, continue symbicort, Albuterol Q 6 hours PRN, Incentive spirometry Q1H  Chronic Diastolic CHF :  Restarted BB. Lasix 20mg  daily, Follow I/Os Bilateral lower extremity ulcers  Stable, daily dressing changes Severe protein calorie malnutrition  Off TNA, advance to heart healthy. Anemia of chronic disease  Hemoglobin and hematocrit stable and at pt's baseline    Code Status: DNR Family Communication: Updated patient no family at bedside Disposition Plan: To skilled nursing facility today   Consultants: Dr. Arlyce Dice (GI) --> signed off  Dr. Ezzard Standing (Surgery)  Dr. Molli Knock [facility]) --> signed off  Dr. Excell Seltzer (Cardiology) --> signed off    Procedures: Left colectomy and colostomy 03/14/2012 by Dr. Carolynne Edouard  CT abdomen and pelvis 03/10/2012, 3/3/ 2014, 02/29/2012  Antibiotics: Fluconazole 03/10/2012 -->3/15  Zosyn 03/10/2012 -->3/15  Flagyl 03/12/2012 -->3/14   HPI/Subjective: Patient with some slight tenderness around surgical site. Patient states she feels weak. No other complaints. Tolerating current  diet.  Objective: Filed Vitals:   03/27/12 0930 03/27/12 1012 03/27/12 1020 03/27/12 1026  BP: 133/48  122/46 120/49  Pulse: 74 74 85 85  Temp: 98.1 F (36.7 C)     TempSrc: Oral     Resp: 16     Height:      Weight:      SpO2: 100%       Intake/Output Summary (Last 24 hours) at 03/27/12 1110 Last data filed at 03/27/12 0700  Gross per 24 hour  Intake   1360 ml  Output    975 ml  Net    385 ml   Filed Weights   03/25/12 0618 03/26/12 1430 03/27/12 0625  Weight: 58.4 kg (128 lb 12 oz) 62.324 kg (137 lb 6.4 oz) 62.3 kg (137 lb 5.6 oz)    Exam:   General:  NAD  Cardiovascular: RRR  Respiratory: CTAB  Abdomen: Soft/ND/ mildlt ttp, incision site c/d/i. Greenish stool in ostomy bag.+BS  Extremities: No clubbing cyanosis or edema.  Data Reviewed: Basic Metabolic Panel:  Recent Labs Lab 03/21/12 0420  03/23/12 0500 03/24/12 0500 03/25/12 0430 03/26/12 0508 03/27/12 0400  NA 139  < > 134* 137 137 137 139  K 4.7  < > 4.1 4.2 4.0 4.1 4.0  CL 104  < > 99 103 103 103 104  CO2 28  < > 26 29 29 27 27   GLUCOSE 85  < > 143* 124* 109* 124* 90  BUN 34*  < > 35* 32* 31* 29* 25*  CREATININE 0.88  < > 0.79 0.73 0.77 0.78 0.76  CALCIUM 9.5  < > 9.2 9.2 9.2 9.4 9.1  MG 2.1  --   --   --  2.0  --   --   PHOS 3.2  --   --   --  2.9  --   --   < > = values in this interval not displayed. Liver Function Tests:  Recent Labs Lab 03/23/12 0500 03/24/12 0500 03/25/12 0430 03/26/12 0508 03/27/12 0400  AST 97* 36 34 23 19  ALT 125* 79* 70* 54* 41*  ALKPHOS 146* 130* 124* 127* 113  BILITOT 0.2* 0.2* 0.3 0.2* 0.2*  PROT 5.7* 5.6* 5.7* 6.2 5.9*  ALBUMIN 2.2* 2.2* 2.2* 2.4* 2.3*   No results found for this basename: LIPASE, AMYLASE,  in the last 168 hours No results found for this basename: AMMONIA,  in the last 168 hours CBC:  Recent Labs Lab 03/23/12 0500 03/24/12 0500 03/25/12 0430 03/26/12 0508 03/27/12 0400  WBC 10.0 9.3 9.8 13.0* 11.9*  NEUTROABS  --   --   7.1  --   --   HGB 8.6* 8.5* 8.3* 9.7* 9.0*  HCT 27.3* 26.2* 26.2* 29.6* 28.0*  MCV 91.6 91.0 91.6 91.1 91.8  PLT 453* 418* 414* 472* 420*   Cardiac Enzymes: No results found for this basename: CKTOTAL, CKMB, CKMBINDEX, TROPONINI,  in the last 168 hours BNP (last 3 results)  Recent Labs  11/03/11 0628 03/05/12 0450 03/10/12 0505  PROBNP 1925.0* 8790.0* 278.1   CBG:  Recent Labs Lab 03/25/12 2327 03/26/12 0620 03/26/12 1155 03/26/12 1633 03/26/12 2353  GLUCAP 144* 137* 128* 107* 114*    No results found for this or any previous visit (from the past 240 hour(s)).   Studies: No results found.  Scheduled Meds: . antiseptic oral rinse  15 mL Mouth Rinse q12n4p  . budesonide-formoterol  1 puff Inhalation BID  . chlorhexidine  15 mL Mouth Rinse BID  . enoxaparin (LOVENOX) injection  40 mg Subcutaneous Q24H  . feeding supplement  237 mL Oral BID BM  . furosemide  20 mg Oral Daily  . insulin aspart  0-15 Units Subcutaneous Q6H  . metoprolol tartrate  25 mg Oral BID  . pantoprazole  40 mg Oral Daily  . tiotropium  18 mcg Inhalation Daily   Continuous Infusions: . sodium chloride 20 mL/hr (03/26/12 1026)    Principal Problem:   Diverticulitis Active Problems:   UTI (lower urinary tract infection)   Chronic respiratory failure with hypoxia   Diastolic CHF, chronic   Pancreatic mass   Anemia   Wound of left leg   Leukocytosis, unspecified   Mitral regurgitation   Ileus, postoperative    Time spent: > 35 MINS    [facility]  [facility] Pager [phone]. If 7PM-7AM, please contact night-coverage at [email or web address], password [facility] 03/27/2012, 11:10 AM  LOS: 27 days

## 2012-03-28 ENCOUNTER — Encounter (HOSPITAL_COMMUNITY): Payer: Medicare Other

## 2012-03-28 LAB — GLUCOSE, CAPILLARY: Glucose-Capillary: 114 mg/dL — ABNORMAL HIGH (ref 70–99)

## 2012-04-09 ENCOUNTER — Encounter (INDEPENDENT_AMBULATORY_CARE_PROVIDER_SITE_OTHER): Payer: Self-pay | Admitting: General Surgery

## 2012-04-09 ENCOUNTER — Ambulatory Visit (INDEPENDENT_AMBULATORY_CARE_PROVIDER_SITE_OTHER): Payer: Medicare Other | Admitting: General Surgery

## 2012-04-09 VITALS — BP 126/80 | HR 74 | Temp 98.1°F | Resp 30 | Ht 60.0 in | Wt 125.6 lb

## 2012-04-09 DIAGNOSIS — K5732 Diverticulitis of large intestine without perforation or abscess without bleeding: Secondary | ICD-10-CM

## 2012-04-09 DIAGNOSIS — K5792 Diverticulitis of intestine, part unspecified, without perforation or abscess without bleeding: Secondary | ICD-10-CM

## 2012-04-09 NOTE — Patient Instructions (Signed)
Use miralax daily to avoid constipation

## 2012-04-09 NOTE — Progress Notes (Signed)
Subjective:     Patient ID: Katie Galvan, female   DOB: 1927/12/02, 79 y.o.   MRN: [id number]  HPI The patient is an 79 year old white female who is about 3 weeks status post left and sigmoid colectomy with colostomy. She is now out of the hospital and participated in rehabilitation. Her appetite is good and her ostomy is working well. Amazingly she is not requiring oxygen at home anymore since the surgery.  Review of Systems     Objective:   Physical Exam On exam her abdomen is soft with minimal tenderness. Her midline incision is healing nicely with no sign of infection. Her ostomy is pink and productive.    Assessment:     The patient is 3 weeks status post left sigmoid colectomy with colostomy     Plan:     At this point she will continue to work with physical therapy. She'll continue to taking good oral nutrition. We will plan to see her back in a month to check her progress.

## 2012-04-17 ENCOUNTER — Ambulatory Visit (INDEPENDENT_AMBULATORY_CARE_PROVIDER_SITE_OTHER): Payer: Medicare Other | Admitting: Pulmonary Disease

## 2012-04-17 ENCOUNTER — Encounter: Payer: Self-pay | Admitting: Pulmonary Disease

## 2012-04-17 VITALS — BP 106/72 | HR 78 | Temp 97.6°F | Ht 64.0 in | Wt 134.2 lb

## 2012-04-17 DIAGNOSIS — I5032 Chronic diastolic (congestive) heart failure: Secondary | ICD-10-CM

## 2012-04-17 DIAGNOSIS — K5732 Diverticulitis of large intestine without perforation or abscess without bleeding: Secondary | ICD-10-CM

## 2012-04-17 DIAGNOSIS — I340 Nonrheumatic mitral (valve) insufficiency: Secondary | ICD-10-CM

## 2012-04-17 DIAGNOSIS — M949 Disorder of cartilage, unspecified: Secondary | ICD-10-CM

## 2012-04-17 DIAGNOSIS — R1319 Other dysphagia: Secondary | ICD-10-CM

## 2012-04-17 DIAGNOSIS — D649 Anemia, unspecified: Secondary | ICD-10-CM

## 2012-04-17 DIAGNOSIS — R0689 Other abnormalities of breathing: Secondary | ICD-10-CM | POA: Insufficient documentation

## 2012-04-17 DIAGNOSIS — F32A Depression, unspecified: Secondary | ICD-10-CM

## 2012-04-17 DIAGNOSIS — I059 Rheumatic mitral valve disease, unspecified: Secondary | ICD-10-CM

## 2012-04-17 DIAGNOSIS — I509 Heart failure, unspecified: Secondary | ICD-10-CM

## 2012-04-17 DIAGNOSIS — I872 Venous insufficiency (chronic) (peripheral): Secondary | ICD-10-CM | POA: Insufficient documentation

## 2012-04-17 DIAGNOSIS — K869 Disease of pancreas, unspecified: Secondary | ICD-10-CM

## 2012-04-17 DIAGNOSIS — M8580 Other specified disorders of bone density and structure, unspecified site: Secondary | ICD-10-CM | POA: Insufficient documentation

## 2012-04-17 DIAGNOSIS — J986 Disorders of diaphragm: Secondary | ICD-10-CM

## 2012-04-17 DIAGNOSIS — K8689 Other specified diseases of pancreas: Secondary | ICD-10-CM

## 2012-04-17 DIAGNOSIS — M545 Low back pain, unspecified: Secondary | ICD-10-CM | POA: Insufficient documentation

## 2012-04-17 DIAGNOSIS — F329 Major depressive disorder, single episode, unspecified: Secondary | ICD-10-CM | POA: Insufficient documentation

## 2012-04-17 DIAGNOSIS — M1990 Unspecified osteoarthritis, unspecified site: Secondary | ICD-10-CM

## 2012-04-17 DIAGNOSIS — K5792 Diverticulitis of intestine, part unspecified, without perforation or abscess without bleeding: Secondary | ICD-10-CM

## 2012-04-17 DIAGNOSIS — R0989 Other specified symptoms and signs involving the circulatory and respiratory systems: Secondary | ICD-10-CM

## 2012-04-17 MED ORDER — CLOTRIMAZOLE-BETAMETHASONE 1-0.05 % EX CREA: TOPICAL_CREAM | Freq: Two times a day (BID) | CUTANEOUS | Status: AC

## 2012-04-17 NOTE — Progress Notes (Signed)
Subjective:     Patient ID: Katie Galvan, female   DOB: 16-Jul-1927, 79 y.o.   MRN: [id number]  HPI  Review of Systems  Physical Exam  Subjective:    Patient ID: Katie Galvan, female    DOB: [birth year], 79 y.o.   MRN: [id number]  HPI: 8 y/o WF, mother of Katie Galvan, who moved here from [place] in 2011...  she has multiple medical problems including chronically elev right hemidiaph;  HBP;  Diastolic dysfunction & mild PulmHTN on 2DEcho;  VI & Edema;  Hx Dyspagia & gastritis;  Divertics & ?colon polyp;  Hx UTI & renal failure from dehydration 3/12;  Severe DJD- s/p R.THR/ ?CPPD/ LBP/ Chr Pain Syndrome on MS Contin;  Osteopenia;  Anemia... SEE PREV EPIC NOTES FOR EARLIER DATA>>   ~  February 21, 2011:  64mo ROV & Katie Galvan is stable> she saw Katie Galvan 12/12 & doing well on Lasix40, Aldactone12.5, & Coreg 6.25Bid; he repeated her 2DEcho & her LVF had recovered to 55-60% (LV cavity mildly dil, mild LVH, mild MR, PAsys=3mmHg); he felt her myopericarditis had resolved & he released her from CHF clinic follow up...    Hx Resp Fail> chr elev right hemidiaph, on O2 w/ exerc & Qhs, Advair100 Bid & Spiriva daily; stable & rec to continue same plus incr exerc as able...    HBP> controlled on the Coreg, diuretics, & Lisinopril 2.5mg /d (off prev CCB); BP= 148/88 & she is reminded to elim salt, etc; wt is up 3# to 142# & we will monitor...    Chol> with the mult hospitalizations & rehab stays w/ numerous med reconciliations done- she has stopped her prev Mevacor20; we discussed checking f/u FLP off the statin in the future...    ORTHO> she has persistent discomfort in her hip, right shoulder, back & the chr pain syndrome currently on MSContin 30mg AM & 15mg PM; we discussed trial of decr to 15mg Bid betw now 7 f/u in 3 months...    GYN> she mentioned some vag spotting & apparently has a pessary in place since her last check in [place]; we will refer to GYN for eval...  ~  May 04, 2011:  71mo  ROV & add-on for recent incr SOB; she reports fall recently w/ feet swelling now & noted incr SOB; she is on her Oxygen at 1-2L/min, and had f/u Cards 3/13 for her sys & diast heart failure but they kept her diuretics the same> on Lasix 40mg /d but she is instructed to incr to 2/d if she has extra fluid/ edema/ etc...    She also had GYN appt Katie Galvan 2/13 for post menopausal bleeding, pessary for uterine prolapse x yrs (it was changed regularly in [place] but not in >58yr); Katie Galvan said her vag walls & Cx were irritated & friable, he removed pessary & Rx w/ estrace cream, she was to ret in 10d but didn't go (pessary is still out & she has not experienced any descensus so far); she will sched GYN follow up at her convenience... CXR 5/13 showed marked eventration of right hemidiaph (no change), mild cardiomeg, atherosclerotic calcif of tortuous Ao, sl congestion & Atx, etc... LABS 5/13:  Chems- ok w/ BS=125 BUN=14 Creat=0.8;  CBC- ok w/ Hg=12.3;  TSH=1.71;  BNP= 368...  ~  May 23, 2011:  3wk ROV & Katie Galvan is improved- breathing better, less SOB, no chest discomfort, and ?swelling down; she notes better appetite & wt is actually up 4#; we discussed continuing the Lasix 40mg  each AM but  she may incr to 80mg  on any day that swelling doesn't go down overnight; rec regular exercise etc & she is getting PT at [facility]...  ~  September 06, 2011:  25mo ROV & post hosp check> She states it all started w/ "falls"- went to ER 07/30/11 after having been found on the floor at [facility], didn't know what happened, no apparent injury etc, ?some speech prob, no focal neuro deficits;  Labs- Hg=11.7, WBC=13.5, BUN/Cr= 30/1.3;  Urine C&S= neg;  CT Head> atrophy & sm vessel dis, no acute infarct or hem;  CXR> chr changes & elev right hemidiaph, NAD;  EKG> SBrady, rate58, NAD;  She was ret to the NH improved...    She was Adm to [facility] 7/31 - 08/07/11 after another "fall" & by report they were occuring at night when trying to get OOB-  syncopal & awakes on the floor, no recollection of the fall etc; no major trauma, no incont, not post ictal, she was mildly bradycardic on Coreg6.25Bid, no signif arrhythmia noted, felt to be prob orthostatic vs vagal;  Labs- Hg=10-11.5, WBC=10-12K, BUN/Cr= 35-21/1.03-0.86, BNP=1420, Urine+Ecoli sens Cipro;  CDopplers> tortuous vessels, no signif stenosis, vertebrals patent w/ antegrade flow;  EKG> SBrady, rate53, NSSTTWA;  2DEcho> mild LVH, norm LVF w/ EF=65-70%, Gr1DD, calcif mitral annulus, mildly thick leaflets, mild MR, mild LA dil, mild calcif AoV leaftlets, no AS/AI;  She was disch to SNF at [facility] & meds adjusted==>They reduced Coreg3.125Bid, reduced MSContin15Bid, reduced Ambien5prn...    In the [place] they f/u labs and found BUN=55, Creat=1.13 and Lasix, Aldactone, Lisinopril HELD; subseq Labs improved & restarted Aldactone25-1/2tab & Lisinopril5mg /d; final Labs 08/31/11 showed BUN=27, Creat=0.94...    Since ret to her apt she is improved- not dizzy, not postural, no recurrent falls or syncpe; Pulm & Cardiac ROS is neg & she is ambulating w/ walker, still has hip pain & c/o new right shoulder pain w/ decr ROM (may have hit it during one of her prev falls); Katie Galvan will call Katie Galvan for Ortho eval;  They also mentioned decr hearing (she says OK) & I encouraged them to get Audiology eval at ENT office Katie Galvan...  ~  October 09, 2011:  27mo ROV & son reports that she's continued to be weak & has lost 9# to 149# today; they feel sl better since she has cut Aldactone25 in half & stopped the Lasix40- now just using it 'prn" & none recently; we discussed need for incr exercise, daily physical activity, & she has f/u w/ her orthopedist soon...    We reviewed prob list, meds, xrays and labs> see below for updates >> she has already had the 2013 Flu vaccine...  ~  Galvan 13, 2013:  27mo ROV & post hosp visit>  Hosp again 10/29- 11/03/11 this time w/ diverticulitis- presented w/ LLQ pain x1d, WBC elev at 18K, CT  showed sigmoid diverticulitis (wall thickening & inflamm changes); treated w/ Cipro/ Flagyl, slowly advanced diet, etc; NOTE> CT Abd also revealed hypodense mass in pancreas at the uncinate process measuring 3.7 x 1.4cm c/w cystic neoplasm; she saw Amy in GI post hosp> and was improved, they extended the cipro/ flagyl for 7d more & set her up to see DrJacobs for poss endoscopic ultrasound & bx (Note> CEA=2.7,  Ca19-9= 7.2 w/norm<35)...    She has continued close f/u in CHF clinic w/ Katie Galvan's PAs> seen 11/09/11 & they adjusted Lasix for her vol status to 40mg /d if wt>143#; and they plan 2 wk holter monitor.Marland KitchenMarland Kitchen  She is still feeling miserable- exhausted & even too tired to shower etc; daughter-in-law wonders about depression & we offered low dose medication to see if this helps but ultimately she declined new meds and we will observe    We reviewed prob list, meds, xrays and labs> see below for updates >>   ~  January 09, 2012:  642mo ROV & Katie Galvan has had several f/u visits w/ her specialists as noted below;  Her CC= SOB but she looks to be at baseline, comfortable & in NAD; when pressed on the symptoms she describes SOB at rest & activity, trouble getting the air "in- like I can't get a deep breath"; she specifally denies cough, sput, chest congestion, etc and has not had f/c/s; in addition she denies CP, palpit, worsening edema, PND, etc but she doesn't like to lie flat- I offered hosp bed for positioning but she declines; note that exam is clear- elev right hemidiaph w/o change but no wheezing, rales, rhonchi, & cardiac exam unchanged as well... We decided to treat her symptom w/ low dose Klonopin 0.25mg - 1/2 tab twice daily as needed for the dyspnea...    She has had several f/u visits in the CHF clinic- last 01/01/12 for her mixed sys/diastolic HF> last 2DEcho 8/13 showed recovery w. EF=65-70%, Gr1DD, mildMR, mild LAdil; they changed Coreg to BISOPROLOL5mg , and added ALDACTONE 25mg /d; they are checking  blood work at home 7 she doesn't want additional labs from [place] today; she is rec to continue f/u in their clinic...    She has VI, stasis changes and chronic bilat LE leg wounds L>R; they are been attended by the visiting nurses & [facility] staff...    She saw DrJacobs for GI 11/13> Diverticulitis resolved, remote colonoscopy reported normal, the CT mass in the pancreas appeared to be similar to prev scan (10/12 CT Abd report "low attenuation along the uncinate process is nonspecific"- no change per DrJacobs & he felt this was likely a benign lesion & he did not favor aggressive work up or Bx- plans f/u CT in 6 months...    She had a 65mo f/u appt w/ DrMohammed due to her anemia (ACD +Fe defic)> he noted Hg= 8.5-9.3 during her diverticulitis hosp; Hg was improved to 10.7 on oral Fe supplement- he rec continued oral Iron & f/u prn... We reviewed prob list, meds, xrays and labs> see below for updates >> we signed for a handicap sticker...  ~  April 17, 2012:  42mo ROV & Katie Galvan was Adm to the hosp [facility]) for 62mo 3/14 w/ diverticulitis & ultimately had surg w/ left hemicolectomy & colostomy by DrToth, she had prolonged post-op course w/ ileus & TNA, disch 3/25 back to [facility] for rehab; slowly improving & getting her strength back- she denies brweathing problem, CP, palpit, swelling etc (she has lost 17# down to 134# today... We reviewed the following medical problems during today's office visit >>      HxDyspnea, Elev right hemidiaph, Chr resp insuffic> on Claritin, Symbicort160, Spiriva; she is off her HomeO2 since disch & breathing satis w/ less dyspnea, getting PT & ambulating w/ walker etc...    HxHBP, HxMyopericarditis, Chr diastolic CHF> on ZOX09, Metop25Bid, Lasix40-1/2 & Aldactone25-1/2 (on this at disch 3/14); BP= 106/72 & slowly improving; she had several f/u visits in the CHF clinic w/ several med changes- last 01/01/12 for her mixed sys/diastolic HF; last 2DEcho 8/13 showed recovery w/ EF=65-70%,  Gr1DD, mildMR, mild LAdil...    VI, Stasis changes,  Edema> prev venous stasis ulcers are healed & being managed by nursing staff at [facility] (& the wound doc who comes once per week)- they have her on ZincGluconate 100mg /d...    Hx Hyperlipidemia> she has been off meds x 51yrs and never had f/u FLP; during the 3/14 hosp her Chol= 91-167 and TG=80-171 on TNA...    Hx HH, Gastitis, neg HPylori> on Protonix40, Zofran4; currently denies abd pain, dysphagia, n/v, etc...    Divertics, s/p left colon resection 3/14 w/ colostomy, Hx colon polyps> Adm 3/14 w/ recurrent diverticulitis & ultimately had left colon resection w/ colostomy by DrToth; stable post op 7 saw [facility] 4/14 w/ incision healed, colostomy ok, doing satis w/ rehab...     Cystic pancreatic mass> CT mass in the pancreas appeared to be similar to prev scan (10/12 CT Abd report "low attenuation along the uncinate process is nonspecific"- no change per DrJacobs & he felt this was likely a benign lesion & he did not favor aggressive work up or Bx- plans f/u CT in 6 months...    DJD, s/p rightTHR & subseq revion of acetab component, s/p left wrist fx w/ surg, c/o shoulder pain> she is off of her narcotic analgesics & just using OTC meds as needed...    Osteopenia> on Calcium, MVI, VitD, etc...     Hx Depression & Insomnia> on Celexa 20mg /d & prev Ambien as needed...    Hx Anemia> Hx ACD +Fe defic> on Fe-Bid + VitC, hosp labs showed Hg in the 8-10 range; she had blood work at [facility] w/ Hg=14 (ok to decr the Fe to one daily)  We reviewed prob list, meds, xrays and labs> see below for updates >>  We discussed getting her CMet & Fe levels checked...    HOSPITALIZATIONS: ~  [facility] 11/3 - 11/08/09 after fall at home w/ comminuted left wrist fx & had surg by DrKuzma ~  [facility] 3/11 - 03/17/10 w/ UTI, dehydration & renal insuffic> back to norm w/ hydration; also anemic, Fe defic & GI eval DrDBrodie w/ HH/ gastritis ~  [facility] - 04/18/10 by Katie Galvan for  right THR & required a revision of the acetabular component 4d after the initial surg... ~  [facility] 6/2 - 06/13/10 w/ hypoxemic acute resp failure precipitated by fluid overload & diastolic CHF (BNP=2000) in assoc w/ her chr elev right hemidiaph, RLL atx/ scarring, & scoliosis w/ multilevel degen disc dis(w/ resultant restrictive lung disease)... ~  [facility] 10/7 - 10/19/10 w/ abd pain/ N/ V/ D & found to have ?colitis ?presumed infectious & resolved w/ Cipro/ Flagyl; also had CP & abn EKG/ Enz felt to be a myopericarditis... ~  [facility] 7/31 - 08/07/11 w/ fall/syncope ?etiology- felt to be poss orthostatic vs vagal; Neuro & CV evals were unrevealing, meds adjusted & improved... ~  [facility] 10/29- 11/03/11 w/ diverticulitis- presented w/ LLQ pain x1d, WBC elev at 18K, CT showed sigmoid diverticulitis (wall thickening & inflamm changes); treated w/ Cipro/ Flagyl, slowly advanced diet, etc; GI extended the course of antibiotics for 1 more week; also has cystic pancreatic mass & referred to [facility] for EUS, pos bx. ~  Hosp 2/27 - 03/26/12 w/ diverticulitis & ultimately went for left hemicolectomy w/ colostomy by DrToth; post op complic by Ileus & need for TNA; disch 3/25 back to [facility] for rehab...          Problem List:    ACUTE ON CHRONIC RESP INSUFFICIENCY>  Precipitated 6/12 by diastolic CHF superimposed on  her chronic resp problems etc... Now on ADVAIR100 Bid, SPIRIVA daily, O2 2L/ min... DIAPHRAGMATIC DISORDER (ICD-519.4) - she has a chronically elev right hemidiaph, apparently idiopathic; and she is mostly asymptomatic w/o cough, phlegm, hemoptysis, ch in SOB, etc... ~  11/11:  CXR showed calcif Ao, elev right hemidiaph, mild scarring at bases, NAD... ~  6/12:  Presented to ER w/ hypoxemic resp failure due to vol overload assoc w/ her restrictive lung dis & chr pain syndrome requiring narcotic analgesics... ~  7/12:  O2 sats improved & OK to cut back the Oxygen to prn... ~  10/12:  Hosp w/ ?colitis and  ?myopericarditis> developed fluid retention, CHF, abn EKG/ Enz & 2DEcho> seen by [facility] ==> now followed in the CHF clinic/ Katie Galvan & back on Oxygen regularly ==> weaned to exerc & Qhs. ~  5/13:  She is feeling better & wonders if she needs the O2; offered to check ambulatory O2 here & poss ONO at [facility] but she wants to wait... ~  7/13:  CXR showed chr changes & elev right hemidiaph, NAD... ~  11/13:  CXR showed borderline heart size & calcif Ao, elev right hemidiaph, right base atx, NAD.Marland Kitchen. ~  CXRs 3/14 hosp showed elev right hemidiaph & stable right basilar atx  Hx HYPERTENSION (ICD-401.9) VALVULAR HEART DISEASE (ICD-424.90) > prev trivial AI, mild MR... Hx of CARDIAC ARRHYTHMIA (ICD-427.9)  EPISODE of MYOPERICARDITIS w/ decr LVF> see 10/12 Hosp... ~  she was on ASA81, Diltiazem240, Vasotec20, & LASIX 40mg /d; but meds changed during the 10/12 Hosp to ASA 81mg /d, LISINOPRIL 2.5mg /d, LASIX 20mg /d ==> freq med adjustments/ titrations from Katie Galvan CHF clinic... ~  11/11 & 6/12:  EKG showed NSR, NSSTTWA, NAD... ~  2DEcho 6/12 showed mild LVH, norm LVF w/ EF=55-60%, Gr 1 DD, trivial AI, heavily calcif mitral annulus & mild MR, PAsys est . ~  10/12:  SEE ABOVE + EChart records... Hosp w/ ?myopericarditis, elev enz, abn 2DEcho & meds adjusted> now followed freq via the CHF clinic/ Katie Galvan w/ freq med adjustments... ~  12/12:  Now much improved on COREG 6.25Bid, LISINOPRIL 2.5mg /d, LASIX 40mg /d, SPIRONOLACTONE 12.5mg /d; wt down to 139# & BNP= 119... ~  Repeat 2DEcho 12/12 showed LVF recovered to 55-60% (LV cavity mildly dil, mild LVH, mild MR, PAsys=22mmHg). ~  5/13: presents w/ incr SOB & eval shows wt=149# & BNP= 368 despite Lasix40 & Aldactone12.5; BP is also elev at 178/88; rec to increase Lasix to 80 for a few days ==> she reports improved and back to baseline...  ~  EKG 7/13 showed SBrady, rate53, minor NSSTTWA, otherw ok... ~  8/13: she was [facility] w/ fall/syncope ?etiology>  2DEcho showed mild LVH, norm LVF w/ EF=65-70%, Gr1DD, calcif mitral annulus, mildly thick leaflets, mild MR, mild LA dil, mild calcif AoV leaftlets, no AS/AI; now on ASA81, COREG6.25-1/2Bid, LISINOPRIL5, ALDACTONE25-1/2daily... ~  CDopplers 8/13 showed TDS due to anatomy & tortuosity- no signif extracranial carotid dis, vertebrals are patent & antegrade... ~  10/13:  BP= 110/80 & they are reminded to bring up-to-date list of all meds to every visit... ~  Holter Monitor per Katie Galvan 11/13 showed NSR w/ PACs, no signif arrhythmia... ~  1/14:  BP= 128/66 & she notes some dyspnea but denies CP, palpit,, etc... ~  4/14:  on ASA81, Metop25Bid, Lasix40-1/2 & Aldactone25-1/2 (on this at disch 3/14); BP= 106/72 & slowly improving; she had several f/u visits in the CHF clinic w/ several med changes- last 01/01/12 for her mixed sys/diastolic HF; last  2DEcho 8/13 showed recovery w/ EF=65-70%, Gr1DD, mildMR, mild LAdil  VENOUS INSUFFICIENCY >> STASIS CHANGES & LEG ULCERS >> LEG EDEMA, CHRONIC (ICD-782.3) - she has severe venous stasis changes and chronic edema, thickened skin over LE's etc... she knows to be careful w/ hygiene, no salt, elevation, support hose, etc ~  8/12:  Stable on Lasix40, 1+chr edema, wt=137# ~  10/12:  Post hosp check on Lasix20, 3+edema, BNP=483, wt=152#; rec to incr Lasix to 40mg /d... ~  12/12:  meds adjusted as above via CHF clinic & now K=3.6, TCO2=33, BUN=21, Creat=0.9, BNP=119 (her best yet)... ~  5/13:  We discussed adjusting Lasix to 40mg  daily & 80mg  prn for swelling that doesn't go down overnight... ~  8/13:  Post hosp meds adjusted & off Lasix... ~  11/13:  Cards adjusted Lasix to 40mg  prn weight >143# ~  1/14:  Bilat LE ulcers treated by Visiting nurses & [facility] staff w/ dressing changes etc...  HYPERCHOLESTEROLEMIA (ICD-272.0) - she has been on Mevacor 20mg /d from her [place] physician... ~  Perhaps we can try to get an FLP at [facility] since we cannot get her in for  Fasting labs. ~  FLP during the 10/12 hosp showed TChol 146, TG 98, HDL 53, LDL 73 ~  Due to numerous [place], Rehab stays in [place], & mult med reconciliations- her Mevacor was dropped along the way; wants to leave it off & check FLP on diet alone. ~  4/14:  she has been off meds x 83yrs and never had f/u FLP; during the 3/14 hosp her Chol= 91-167 and TG=80-171 on TNA.  OTHER DYSPHAGIA (ICD-787.29) - she was on PRILOSEC 20mg /d & reports occas dysphagia but denies choking etc... she apparently has never had an Endoscopy or GI eval for this problem, "I have to be careful when I eat"... ~  6/12:  NOTE she had EGD 3/12 showing HH, gastritis, neg HPylori> rec increase PPI to Bid. ~  10/12:  She was discharge off her PPI therapy ==> she denies reflux symptoms. ~  8/13:  She states stable w/o abd pain, n/v, dysphagia, etc...  DIVERTICULOSIS OF COLON (ICD-562.10) & Recurrent Diverticulitis w/ left colon/ sigmoid resction, & colostomy 3/14 by DrToth... Hx of COLONIC POLYPS (ICD-211.3) - she tells me that prev colonoscopy in [place] showed divertics & ?polyp but she does not recall any details...  she denies much constipation despite the narcotic analgesics- uses prunes as needed. ~  [facility] 10/12 w/ ?infectious colitis> resolved after Cipro/ Flagyl Rx ==> uses Miralax prn due to narcotics. ~  [facility] 10/13 w/ diverticulitis- presented w/ LLQ pain x1d, WBC elev at 18K, CT showed sigmoid diverticulitis (wall thickening & inflamm changes); treated w/ Cipro/ Flagyl, slowly advanced diet, etc; GI extended the course of antibiotics for 1 more week; also has cystic pancreatic mass & referred to [facility] for EUS, pos bx. ~  Adm 2/27 - 03/26/12 w/ recurrent diverticulitis & ultimately underwent surg w/ left colon & sigmoid resection w/ colostomy by DrToth... CT Abd 3/14 revealed chr bowel wall thickening & inflamm in the sigm region; incidental findings included bilat renal cysts, s/p hyst...  ~  4/14: post op f/u by DrToth> doing  satis in [place] at [facility] rehab, incision healed, ostomy is good...  CYSTIC PANCREATIC MASS >> seen on CT Abd 10/13 when adm for diverticulitis; referred to [facility] for outpt evaluation & seen ~  CT Abd 10/13 showed hypodense mass at the uncinate process of the pancreas felt to represent a cystic  neoplasm; mult other findings- sigmoid divertic colitis, atrophic kidneys w/ cysts and 5mm stone in lower pole of left kidney, atherosclerosis...  DEGENERATIVE JOINT DISEASE (ICD-715.90) - she has severe osteoarthritis w/ XRays 11/11 showing severe osteopenia, severe right hip degeneration & relative sparing of the left hip joint;  DJD knees w/ ?CPPD, abn patella;  left wrist fx- radial  head & ulnar styloid... she had surg left wrist by DrKKuzma... ~  4/12:  Katie Galvan did right THR but required revision of acetabular component 4d later> went to [facility] for rehab... ~  Katie Galvan continues to follow w/ shots as needed for bursitis she says... ~  10/12:  She is c/o right shoulder pain & decr ROM, she wonders if this might be the cause of her chest discomfort; she will f/u w/ Ortho for XRays & shot... ~  8/13:  She is again c/o right shoulder pain & wonders if she injured it during one of her falls; she will f/u w/ Katie Galvan for eval...  GYN >>  ~  2/13: pt mentioned some spotting & apparently has a pessary in place since her days in [place]; she is in need of GYN eval & check up & we will refer... ~  2/13: she saw Katie Galvan for GYN> said her vag walls & Cx were irritated & friable, he removed pessary & Rx w/ estrace cream, she was to ret in 10d but didn't go (pessary is still out & she has not experienced any descensus so far); she will sched GYN follow up at her convenience...  Hx of BACK PAIN, LUMBAR (ICD-724.2) CHRONIC PAIN SYNDROME (ICD-338.4) - she has chronic pain- mostly from her arthritic condition- and was started on MSContin by her LMD in [place] in the summer of 2011> dose slowly  increased & she was on 90mg  Bid (taking a 60mg  tab + 30mg  tab Bid)... she does not believe that this med has anything to do w/ her fall & wrist fx, or her complaints of decr memory etc... we discussed the need to wean off this medication & try to deal w/ her arthritis pain thru an Orthopedist or poss a Pain Clinic... ~  11/11:  she notes pain worse during the day while up & about> try to decr MSContin to 90mg AM & 60mg PM... ~  1/12 & 4/12:  she is down to 60mg Bid & we discussed weaning further ==> she was down to 45mg  (30+15) Bid by the time of her right THR... ~  6/12:  Recommend that she try to wean further> try 45am & 30pm... ~  7/12:  She's down to 30mg  Bid & will wean further as able... ~  10/12:  She remains on the MSContin at 30mg Bid... ~  12/12:  We discussed further slow wean down to 30-15 first... ~  2/13:  We discussed trying to wean down to 15mg  Bid over the next few months, but she was unable to do so; states that Katie Galvan feels it's due to her back... ~  8/13:  She is down to MSContin 15mg Bid since her recent hosp... ~  11/13:  Hospitalists changed her MSContin to OXYCODONE & she was discharged on this... ~  4/14:  She is now post hosp for diverticulitis & left colon resection w/ colostomy; she was disch off of her narcotic analgesics & doing well...  OSTEOPENIA (ICD-733.90) - XRays here showed severe osteopenia... she notes that she used to be on Fosamax but ?how long? & when stopped?... she states that she was told her  prev BMD was "good"... she has not been taking calcium supplements, but was prev on some OTC Vit D supplements... asked to resume Calcium, Women's MVI, Vit D 1000u daily...  Hx of DEPRESSION (ICD-311) - on CELEXA 20mg /d and she wants to continue this med... INSOMNIA >> on AMBIEN 10mg - 1/2 tab as needed...  ANEMIA, MILD (ICD-285.9) - Hg post op 11/11 wrist fx surg = 11.2 ~  6/12:  Labs showed Hg= 9.1, MCV= 77, Fe= 15 (7%sat), & she has B pos blood> we decided to incr  Fe to Bid (may need IV Fe if not responding). ~  7/12:  Labs showed Hg= 10.0, MCV= 76, she reports stool checks at [facility] were NEG... ~  8/12: she reports that she had f/u Heme eval DrMohammed & "he released me" on Fe + VitC daily... ~  10/12:  Labs post hosp showed Hg= 11.0, MCV= 87 ~  Labs 5/13 showed Hg= 12.3 ~  10/13:  Labs during her Hosp for Diverticulits showed Hg down to 8.5; Disch on Fe supplementation... ~  11/13: she had f/u DrMohammed & Hg up to 10.7, rec to continue Fe... ~  4/14:  on Fe-Bid + VitC, hosp labs showed Hg in the 8-10 range; she had blood work at [facility] w/ Hg=14 (ok to decr the Fe to one daily)   Past Surgical History  Procedure Laterality Date  . Appendectomy    . Breast surgery      biopsy  . Left wrist surgery    . Bilateral cataracts    . Hip surgery      dr. Rayburn Ma, twice because the bones were spongy  . Tonsillectomy    . Adenoidectomy    . Colostomy revision N/A 03/14/2012    Procedure: COLON RESECTION SIGMOID;  Surgeon: Robyne Askew, MD;  Location: [facility];  Service: General;  Laterality: N/A;  left colectomy with mobilization of splenic flexure  . Colostomy N/A 03/14/2012    Procedure: COLOSTOMY;  Surgeon: Robyne Askew, MD;  Location: [facility];  Service: General;  Laterality: N/A;    Outpatient Encounter Prescriptions as of 04/17/2012  Medication Sig Dispense Refill  . aspirin 81 MG EC tablet Take 81 mg by mouth every morning.       . budesonide-formoterol (SYMBICORT) 160-4.5 MCG/ACT inhaler Inhale 1 puff into the lungs 2 (two) times daily.      . Calcium Carbonate-Vit D-Min (CALTRATE 600+D PLUS) 600-400 MG-UNIT per tablet Chew 1 tablet by mouth 2 (two) times daily.       . cholecalciferol (VITAMIN D) 1000 UNITS tablet Take 2,000 Units by mouth every morning.       . citalopram (CELEXA) 20 MG tablet Take 20 mg by mouth every morning.      . ferrous sulfate 325 (65 FE) MG tablet Take 325 mg by mouth 2 (two) times daily.        .  furosemide (LASIX) 40 MG tablet Take 0.5 tablets (20 mg total) by mouth daily.  30 tablet  0  . loratadine (CLARITIN) 10 MG tablet Take 10 mg by mouth every morning.       . metoprolol tartrate (LOPRESSOR) 25 MG tablet Take 1 tablet (25 mg total) by mouth 2 (two) times daily.  60 tablet  0  . Multiple Vitamin (MULTIVITAMIN WITH MINERALS) TABS Take 1 tablet by mouth every morning.       . ondansetron (ZOFRAN-ODT) 4 MG disintegrating tablet Take 4 mg by mouth every  8 (eight) hours as needed for nausea.      . pantoprazole (PROTONIX) 40 MG tablet Take 1 tablet (40 mg total) by mouth daily.  30 tablet  0  . polyethylene glycol (MIRALAX / GLYCOLAX) packet Take 17 g by mouth daily.      . Probiotic Product (ALIGN) 4 MG CAPS Take 1 capsule by mouth every morning.      Marland Kitchen spironolactone (ALDACTONE) 25 MG tablet Take 12.5 mg by mouth every morning.      . tiotropium (SPIRIVA) 18 MCG inhalation capsule Place 18 mcg into inhaler and inhale 2 (two) times daily.      . vitamin C (ASCORBIC ACID) 500 MG tablet Take 500 mg by mouth 2 (two) times daily.       Marland Kitchen zinc gluconate 50 MG tablet Take 100 mg by mouth every morning.      Marland Kitchen oxyCODONE (OXY IR/ROXICODONE) 5 MG immediate release tablet Take 1-2 tablets (5-10 mg total) by mouth every 6 (six) hours as needed for pain.  30 tablet  0   No facility-administered encounter medications on file as of 04/17/2012.    Allergies  Allergen Reactions  . Sulfonamide Derivatives Swelling    Current Medications, Allergies, Past Medical History, Past Surgical History, Family History, and Social History were reviewed in [employer] record.    Review of Systems: Constitutional:  Denies F/C/S, anorexia, unexpected weight change. HEENT:  No HA, visual changes, earache, nasal symptoms, sore throat, hoarseness. Resp:  No cough, sputum, hemoptysis; mild SOB/ DOE noted... Cardio:  No CP, palpit, orthopnea;  +edema & DOE but limited mobility. GI:  Denies  N/V/D, tends toward constip due to narcotics; swallowing OK & denies reflux or abd pain.  GU:  No dysuria, freq, urgency, hematuria, or flank pain. MS:  Severe DJD esp right hip w/ pain & decr ROM ==> s/p right THR now... Neuro:  No tremors, seizures, dizziness, syncope; +weakness & multifactorial gait abn. Skin:  No suspicious lesions or skin rash. Heme:  No adenopathy, bruising, bleeding. Psyche: Denies confusion, sleep disturbance, hallucinations; +anxiety & situational depression.    Objective:  Physical Exam:  WD, WN, 79 y/o WF chr ill appearing but in NAD... Vital Signs:  Reviewed... General:  Alert & oriented; pleasant & cooperative... HEENT:  /AT, EOM-wnl, PERRLA, EACs-clear, TMs-wnl, NOSE-clear, THROAT-clear & wnl. Neck:  Supple w/ decr ROM; no JVD; normal carotid impulses w/o bruits; no thyromegaly or nodules palpated; no lymphadenopathy. Chest:  Clear to P & A w/ decr BS at right base; without wheezes/ rales/ or rhonchi heard... Heart:  Regular Rhythm; norm S1 & S2, gr 1/6 SEM w/o rubs or gallops detected... Abdomen:  Soft & nontender; normal bowel sounds; no organomegaly or masses palpated... Ext:  decrROM; +deformities & mod arthritic changes; no varicose veins, +venous insuffic & 1+edema;  Pulses intact w/o bruits... s/p right THR w/ revision; ambulates w/ walker... Neuro:  CNs intact;  No focal neuro deficits, in wheelchair & painful standing & walking... Derm:  No lesions noted; no rash etc... Lymph:  No cervical, supraclavicular, axillary, or inguinal adenopathy palpated...   RADIOLOGY DATA:  Reviewed in the EPIC EMR & discussed w/ the patient...  LABORATORY DATA:  Reviewed in the EPIC EMR & discussed w/ the patient...   Assessment & Plan:    Hx Acute on Chr Resp Failure 6/12 Hosp>  Multifactorial w/ diastolic CHF, elev right hemidiaph, atx, scoliosis, restrictive lung dis, narcotic pain meds- all playing a role... She  was on oxygen as needed at rest & 2L/min w/  exercise; O2 was discontinued at [facility] after her 3/14 admission & states she is doing satis...  Elev right hemidiaph>  This is chronic & assoc w/ some mild basilar atx; encouraged to get deep breaths & expand well...  HBP, etc>  She has had mult adjustments in her meds in & out of Hosp & thru the CHF clinic...  Hx of Myopericarditis w/ abn EKG/ Enz & subseq vol overload w/ abn 2DEcho>  She improved back on her diuretic & meds were adjusted in the CHF clinic; repeat 2DEcho 12/12 w/ recovery of LVF & back to her baseline... F/u 2DEcho 8/13= improved w/ EF=65-70%, Gr1DD...  Diastolic CHF>  She is now on Lasix20 & Aldactone125 & stable...  VI, Edema>  REC- no salt, elevate legs, TED hose, etc...  CHOL>  She has been off all statin rxs for some time now; we will endeavor to get an FLP when able...  GERD, Dysphagia>  She had EGD during prev hosp w/ HH, gastritis & neg HPylori;  Prev on PPI Rx but off now & she feels she is OK, we will follow.  Hx of Colitis- presumed infectious>  Resolved w/ Cipro/ Flagyl prev; then diverticulitis back on these meds & followed by GI for resolution... PANCREATIC LESION>> CT Abd showed cystic pancreatic neoplasm ?etiology; DrJacobs has advocated for conservative eval & Rx... Divertics, Constip>  Now s/p left colon resection w/ colostomy 3/14 by DrToth & she is doing well post op...  DJD, s/p right THR, Chr Pain Syndrome> Trying to wean MSContin further which should be poss based on the fact that the painful right hip has been replaced...  Other medical problems as noted...    GYN> pt c/o spotting & has pessary in place since her last check in [place]; we will help her get a gyn appt ASAP for eval & check up...    Derm> she has a seborrheic dermatitis rash & we will Rx w/ Lotrisone cream...    Anemia> on Fe supplement...   Patient's Medications  New Prescriptions   No medications on file  Previous Medications   ASPIRIN 81 MG EC TABLET    Take 81 mg by  mouth every morning.    BUDESONIDE-FORMOTEROL (SYMBICORT) 160-4.5 MCG/ACT INHALER    Inhale 1 puff into the lungs 2 (two) times daily.   CALCIUM CARBONATE-VIT D-MIN (CALTRATE 600+D PLUS) 600-400 MG-UNIT PER TABLET    Chew 1 tablet by mouth 2 (two) times daily.    CHOLECALCIFEROL (VITAMIN D) 1000 UNITS TABLET    Take 2,000 Units by mouth every morning.    CITALOPRAM (CELEXA) 20 MG TABLET    Take 20 mg by mouth every morning.   FERROUS SULFATE 325 (65 FE) MG TABLET    Take 325 mg by mouth 2 (two) times daily.     FUROSEMIDE (LASIX) 40 MG TABLET    Take 0.5 tablets (20 mg total) by mouth daily.   LORATADINE (CLARITIN) 10 MG TABLET    Take 10 mg by mouth every morning.    METOPROLOL TARTRATE (LOPRESSOR) 25 MG TABLET    Take 1 tablet (25 mg total) by mouth 2 (two) times daily.   MULTIPLE VITAMIN (MULTIVITAMIN WITH MINERALS) TABS    Take 1 tablet by mouth every morning.    ONDANSETRON (ZOFRAN-ODT) 4 MG DISINTEGRATING TABLET    Take 4 mg by mouth every 8 (eight) hours as needed for nausea.   OXYCODONE (OXY  IR/ROXICODONE) 5 MG IMMEDIATE RELEASE TABLET    Take 1-2 tablets (5-10 mg total) by mouth every 6 (six) hours as needed for pain.   PANTOPRAZOLE (PROTONIX) 40 MG TABLET    Take 1 tablet (40 mg total) by mouth daily.   POLYETHYLENE GLYCOL (MIRALAX / GLYCOLAX) PACKET    Take 17 g by mouth daily.   PROBIOTIC PRODUCT (ALIGN) 4 MG CAPS    Take 1 capsule by mouth every morning.   SPIRONOLACTONE (ALDACTONE) 25 MG TABLET    Take 12.5 mg by mouth every morning.   TIOTROPIUM (SPIRIVA) 18 MCG INHALATION CAPSULE    Place 18 mcg into inhaler and inhale 2 (two) times daily.   VITAMIN C (ASCORBIC ACID) 500 MG TABLET    Take 500 mg by mouth 2 (two) times daily.    ZINC GLUCONATE 50 MG TABLET    Take 100 mg by mouth every morning.  Modified Medications   No medications on file  Discontinued Medications   No medications on file

## 2012-04-17 NOTE — Patient Instructions (Addendum)
Today we updated your med list in our EPIC system...    Continue your current medications the same...  We decided to decrease the IRON tab to one daily...  We wrote a new prescription for LOTRISONE cream to use up to twice daily for the rash...  Keep up the good work on your Physical Therapy...  Please have the following labs done in about 46mo>>    CBC, Iron panel, Complete metabolic panel (BMet + LFTs), BNP...  Call for any questions...  Let's plan a follow up visit in 81mo, sooner if needed for problems.Marland KitchenMarland Kitchen

## 2012-04-30 ENCOUNTER — Telehealth (INDEPENDENT_AMBULATORY_CARE_PROVIDER_SITE_OTHER): Payer: Self-pay

## 2012-04-30 NOTE — Telephone Encounter (Signed)
Incoming call regarding patient stoma.  Nursing is concerned that there's moist tissue exposed between the bowel ring of the stoma.  She's not sure of what terminology to give.  It was difficult to understand the question that was being asked.  They would like a call back and please speak with Antonietta Breach, Consulting civil engineer.

## 2012-05-01 NOTE — Telephone Encounter (Signed)
Please call them and ask what they need. Maybe an ostomy specialist should see the pt

## 2012-05-02 ENCOUNTER — Ambulatory Visit (INDEPENDENT_AMBULATORY_CARE_PROVIDER_SITE_OTHER): Payer: Medicare Other | Admitting: General Surgery

## 2012-05-02 ENCOUNTER — Encounter (INDEPENDENT_AMBULATORY_CARE_PROVIDER_SITE_OTHER): Payer: Self-pay | Admitting: General Surgery

## 2012-05-02 VITALS — BP 128/80 | HR 72 | Temp 97.9°F | Resp 22 | Ht 60.0 in | Wt 137.4 lb

## 2012-05-02 DIAGNOSIS — IMO0002 Reserved for concepts with insufficient information to code with codable children: Secondary | ICD-10-CM

## 2012-05-02 DIAGNOSIS — K9409 Other complications of colostomy: Secondary | ICD-10-CM | POA: Insufficient documentation

## 2012-05-02 NOTE — Progress Notes (Signed)
Subjective:     Patient ID: Katie Galvan, female   DOB: [birth year], 79 y.o.   MRN: [id number]  HPI Patient is status post left and sigmoid colectomy with colostomy for diverticulitis by Dr. Carolynne Edouard. This was 6 weeks ago. She comes to urgent clinic today regarding some question about her stoma site. She is at a skilled nursing facility. She has been working hard with therapies and eating fine. Ostomy is been functioning fine. No bleeding.  Review of Systems     Objective:   Physical Exam Frail Needs assist with ambulation Lungs clear to auscultation Heart regular Abdomen soft and nontender, nondistended, midline wound healed, ostomy appliance removed, colostomy stoma is pink with stool output. There is a small separation skin ulcer at the 2:00 position in the non-clock position, no cellulitis no other colicky features. Stoma is otherwise healed to the skin    Assessment:     Small separation skin ulcers along colostomy    Plan:     Continue therapies, no other recommendations. Return to see Dr. Carolynne Edouard on May 12th 2014 as scheduled.

## 2012-05-13 ENCOUNTER — Encounter (INDEPENDENT_AMBULATORY_CARE_PROVIDER_SITE_OTHER): Payer: Self-pay | Admitting: General Surgery

## 2012-05-13 ENCOUNTER — Ambulatory Visit (INDEPENDENT_AMBULATORY_CARE_PROVIDER_SITE_OTHER): Payer: Medicare Other | Admitting: General Surgery

## 2012-05-13 ENCOUNTER — Other Ambulatory Visit (INDEPENDENT_AMBULATORY_CARE_PROVIDER_SITE_OTHER): Payer: Self-pay | Admitting: General Surgery

## 2012-05-13 ENCOUNTER — Telehealth (INDEPENDENT_AMBULATORY_CARE_PROVIDER_SITE_OTHER): Payer: Self-pay | Admitting: General Surgery

## 2012-05-13 VITALS — BP 118/70 | HR 92 | Temp 97.4°F | Resp 32 | Ht 60.0 in | Wt 137.0 lb

## 2012-05-13 DIAGNOSIS — K5732 Diverticulitis of large intestine without perforation or abscess without bleeding: Secondary | ICD-10-CM

## 2012-05-13 DIAGNOSIS — K5792 Diverticulitis of intestine, part unspecified, without perforation or abscess without bleeding: Secondary | ICD-10-CM

## 2012-05-13 DIAGNOSIS — Z742 Need for assistance at home and no other household member able to render care: Secondary | ICD-10-CM

## 2012-05-13 NOTE — Telephone Encounter (Signed)
[facility] will do ostomy teaching for this patient notes were faxed  SOC should start in the next 48 hours

## 2012-05-13 NOTE — Progress Notes (Signed)
Subjective:     Patient ID: Katie Galvan, female   DOB: 27-Nov-1927, 79 y.o.   MRN: [id number]  HPI The patient is an 79 year old white female who is 2 months status post left and sigmoid colectomy for diverticulitis with a transverse colostomy. She is still at the nursing facility in receiving physical therapy. Her hope is to get back to independent living in her apartment. Her appetite has been good and her ostomy is working well. She denies any abdominal pain.  Review of Systems     Objective:   Physical Exam On exam her abdomen is soft and nontender. Her midline incision has healed nicely. Her ostomy is pink and productive.    Assessment:     The patient is 2 months status post left and sigmoid colectomy with colostomy for diverticulitis     Plan:     At this point she can return to her normal activities and physical therapy. We will plan to see her back on a when necessary basis.

## 2012-05-13 NOTE — Patient Instructions (Signed)
Try [facility] for ostomy belt

## 2012-05-22 ENCOUNTER — Other Ambulatory Visit: Payer: Self-pay | Admitting: Pulmonary Disease

## 2012-05-22 MED ORDER — FUROSEMIDE 40 MG PO TABS: 20.0000 mg | ORAL_TABLET | Freq: Every day | ORAL | Status: AC

## 2012-05-22 MED ORDER — CITALOPRAM HYDROBROMIDE 20 MG PO TABS: 20.0000 mg | ORAL_TABLET | Freq: Every morning | ORAL | Status: DC

## 2012-05-22 MED ORDER — METOPROLOL TARTRATE 25 MG PO TABS: 25.0000 mg | ORAL_TABLET | Freq: Two times a day (BID) | ORAL | Status: DC

## 2012-05-22 MED ORDER — PANTOPRAZOLE SODIUM 40 MG PO TBEC: 40.0000 mg | DELAYED_RELEASE_TABLET | Freq: Every day | ORAL | Status: DC

## 2012-06-03 ENCOUNTER — Telehealth: Payer: Self-pay | Admitting: Pulmonary Disease

## 2012-06-03 NOTE — Telephone Encounter (Signed)
DNR has been signed by SN.   Pt is requesting something to help her sleep.  Pt had ambien 10 mg 1 qhs prn for sleep in the past.  SN please advise if ok to refill this.  Thanks  Allergies  Allergen Reactions  . Sulfonamide Derivatives Swelling

## 2012-06-04 MED ORDER — ZOLPIDEM TARTRATE 10 MG PO TABS: 10.0000 mg | ORAL_TABLET | Freq: Every evening | ORAL | Status: AC | PRN

## 2012-06-04 NOTE — Telephone Encounter (Signed)
Per SN---ok to refill the ambien.  rx has been printed out and will place up front once signed.  pts daughter in law will be by this afternoon to pick this up for the pt.

## 2012-06-19 ENCOUNTER — Encounter: Payer: Self-pay | Admitting: Neurology

## 2012-06-19 DIAGNOSIS — S060X9A Concussion with loss of consciousness of unspecified duration, initial encounter: Secondary | ICD-10-CM

## 2012-06-19 DIAGNOSIS — R269 Unspecified abnormalities of gait and mobility: Secondary | ICD-10-CM

## 2012-06-19 DIAGNOSIS — D518 Other vitamin B12 deficiency anemias: Secondary | ICD-10-CM

## 2012-06-19 DIAGNOSIS — R6889 Other general symptoms and signs: Secondary | ICD-10-CM

## 2012-06-27 ENCOUNTER — Telehealth: Payer: Self-pay

## 2012-06-27 NOTE — Telephone Encounter (Signed)
yes

## 2012-06-27 NOTE — Telephone Encounter (Signed)
Dr Christella Hartigan this pt had a CT in March of this year.  You asked for a repeat CT in 6 months at her CT here in Dec.  Does she still need the CT?

## 2012-06-27 NOTE — Telephone Encounter (Signed)
Message copied by Donata Duff on Thu Jun 27, 2012  8:16 AM ------      Message from: Donata Duff      Created: Thu Dec 28, 2011  2:28 PM       Repeat CT of pancreas in 6 months (IV and PO contrast) to check for interval change in known pancreas lesion.       ------

## 2012-06-27 NOTE — Telephone Encounter (Signed)
Left message on machine to call back  

## 2012-06-28 NOTE — Telephone Encounter (Signed)
Left message on machine to call back letter mailed. 

## 2012-07-01 ENCOUNTER — Encounter (HOSPITAL_COMMUNITY): Payer: Self-pay

## 2012-07-01 ENCOUNTER — Ambulatory Visit (HOSPITAL_COMMUNITY)
Admission: RE | Admit: 2012-07-01 | Discharge: 2012-07-01 | Disposition: A | Discharge status: Home / Self Care | Payer: Medicare Other | Source: Ambulatory Visit | Attending: Internal Medicine | Admitting: Internal Medicine

## 2012-07-01 ENCOUNTER — Ambulatory Visit: Payer: Self-pay | Admitting: Neurology

## 2012-07-01 VITALS — BP 108/58 | HR 70 | Wt 141.0 lb

## 2012-07-01 DIAGNOSIS — Z7982 Long term (current) use of aspirin: Secondary | ICD-10-CM | POA: Insufficient documentation

## 2012-07-01 DIAGNOSIS — K5730 Diverticulosis of large intestine without perforation or abscess without bleeding: Secondary | ICD-10-CM | POA: Insufficient documentation

## 2012-07-01 DIAGNOSIS — I509 Heart failure, unspecified: Secondary | ICD-10-CM

## 2012-07-01 DIAGNOSIS — Z79899 Other long term (current) drug therapy: Secondary | ICD-10-CM | POA: Insufficient documentation

## 2012-07-01 DIAGNOSIS — G894 Chronic pain syndrome: Secondary | ICD-10-CM | POA: Insufficient documentation

## 2012-07-01 DIAGNOSIS — D126 Benign neoplasm of colon, unspecified: Secondary | ICD-10-CM | POA: Insufficient documentation

## 2012-07-01 DIAGNOSIS — K219 Gastro-esophageal reflux disease without esophagitis: Secondary | ICD-10-CM | POA: Insufficient documentation

## 2012-07-01 DIAGNOSIS — I5032 Chronic diastolic (congestive) heart failure: Secondary | ICD-10-CM

## 2012-07-01 DIAGNOSIS — I5020 Unspecified systolic (congestive) heart failure: Secondary | ICD-10-CM | POA: Insufficient documentation

## 2012-07-01 DIAGNOSIS — M949 Disorder of cartilage, unspecified: Secondary | ICD-10-CM | POA: Insufficient documentation

## 2012-07-01 DIAGNOSIS — E7800 Pure hypercholesterolemia, unspecified: Secondary | ICD-10-CM | POA: Insufficient documentation

## 2012-07-01 DIAGNOSIS — J449 Chronic obstructive pulmonary disease, unspecified: Secondary | ICD-10-CM | POA: Insufficient documentation

## 2012-07-01 DIAGNOSIS — M545 Low back pain, unspecified: Secondary | ICD-10-CM | POA: Insufficient documentation

## 2012-07-01 DIAGNOSIS — M899 Disorder of bone, unspecified: Secondary | ICD-10-CM | POA: Insufficient documentation

## 2012-07-01 DIAGNOSIS — J4489 Other specified chronic obstructive pulmonary disease: Secondary | ICD-10-CM | POA: Insufficient documentation

## 2012-07-01 DIAGNOSIS — Z85828 Personal history of other malignant neoplasm of skin: Secondary | ICD-10-CM | POA: Insufficient documentation

## 2012-07-01 DIAGNOSIS — Q791 Other congenital malformations of diaphragm: Secondary | ICD-10-CM | POA: Insufficient documentation

## 2012-07-01 NOTE — Patient Instructions (Addendum)
Follow up in 3 months  Please wear your oxygen 24 hours a day.   Do the following things EVERYDAY: 1) Weigh yourself in the morning before breakfast. Write it down and keep it in a log. 2) Take your medicines as prescribed 3) Eat low salt foods-Limit salt (sodium) to 2000 mg per day.  4) Stay as active as you can everyday 5) Limit all fluids for the day to less than 2 liters

## 2012-07-01 NOTE — Assessment & Plan Note (Signed)
Volume status stable. Continue current diuretic regimen. Encouraged her to wear oxygen at all times as she remains SOB with exertion.  Follow up in 3 months.

## 2012-07-01 NOTE — Progress Notes (Signed)
Patient ID: Katie Galvan, female   DOB: [birth year], 79 y.o.   MRN: [id number] PCP/Pulmonologist: Dr [name]  HPI:  [name] is an 79 yo with history of mixed diastolic and systolic HF, LVEF 30%, restrictive lung disease, right hemidiaphragm elevation, COPD, GERD, history of endocarditis recently admitted for colitis requiring cipro/flagyl.  Presumed myocarditis treated with colchicine.  Echo 10/10/10: Hyperdynamic basal function Septal apical anterior hypokinesis The cavity size was severely dilated.  Wall thickness was increased in a pattern of moderate LVH.  There was mild concentric hypertrophy. The estimated ejection fraction was 30%.   S/P Left colectomy  03/14/12 for diverticulosis.   12/12 ECHO EF recovered  55-60%  Echo 08/03/11 EF 65-70%.  Grade 1 diastolic dysfunction.  Mild MR.  Mildly dilated LA  She returns for follow up today with her son. Ongoing dyspnea with exertion. She has not been weighing at home. Mild dyspnea with exertion. She has returned to independent living at [facility]. Only wears oxygen at night.   He son prepares medications.        ROS: All other systems normal except as mentioned in HPI, past medical history and problem list.    Past Medical History  Diagnosis Date  . Disorders of diaphragm     right diaphragm elevation  . Unspecified essential hypertension   . Endocarditis, valve unspecified, unspecified cause   . Cardiac dysrhythmia, unspecified   . Unspecified venous (peripheral) insufficiency   . Edema   . Pure hypercholesterolemia   . Other dysphagia   . Diverticulosis of colon (without mention of hemorrhage)   . Benign neoplasm of colon   . Osteoarthrosis, unspecified whether generalized or localized, unspecified site   . Lumbago   . Chronic pain syndrome   . Disorder of bone and cartilage, unspecified   . Depressive disorder, not elsewhere classified   . Anemia, unspecified   . Skin cancer   . Systolic heart failure     echo 10/10/10 EF  30%  . Hiatal hernia   . Gastritis   . COPD (chronic obstructive pulmonary disease)   . Lower extremity ulceration     dressing changes daily  . Gait disorder   . Chronic low back pain     Current Outpatient Prescriptions  Medication Sig Dispense Refill  . aspirin 81 MG EC tablet Take 81 mg by mouth every morning.       . budesonide-formoterol (SYMBICORT) 160-4.5 MCG/ACT inhaler Inhale 1 puff into the lungs 2 (two) times daily.      . Calcium Carbonate-Vit D-Min (CALTRATE 600+D PLUS) 600-400 MG-UNIT per tablet Chew 1 tablet by mouth 2 (two) times daily.       . cholecalciferol (VITAMIN D) 1000 UNITS tablet Take 2,000 Units by mouth every morning.       . citalopram (CELEXA) 20 MG tablet Take 1 tablet (20 mg total) by mouth every morning.  30 tablet  6  . clotrimazole-betamethasone (LOTRISONE) cream Apply topically 2 (two) times daily.  30 g  11  . ferrous sulfate 325 (65 FE) MG tablet Take 325 mg by mouth daily with breakfast.       . furosemide (LASIX) 40 MG tablet Take 0.5 tablets (20 mg total) by mouth daily.  30 tablet  5  . loratadine (CLARITIN) 10 MG tablet Take 10 mg by mouth every morning.       . metoprolol tartrate (LOPRESSOR) 25 MG tablet Take 1 tablet (25 mg total) by mouth 2 (two) times daily.  60 tablet  6  . Multiple Vitamin (MULTIVITAMIN WITH MINERALS) TABS Take 1 tablet by mouth every morning.       . ondansetron (ZOFRAN-ODT) 4 MG disintegrating tablet Take 4 mg by mouth every 8 (eight) hours as needed for nausea.      . pantoprazole (PROTONIX) 40 MG tablet Take 1 tablet (40 mg total) by mouth daily.  30 tablet  6  . polyethylene glycol (MIRALAX / GLYCOLAX) packet Take 17 g by mouth daily.      . Probiotic Product (ALIGN) 4 MG CAPS Take 1 capsule by mouth every morning.      Marland Kitchen spironolactone (ALDACTONE) 25 MG tablet Take 12.5 mg by mouth every morning.      . tiotropium (SPIRIVA) 18 MCG inhalation capsule Place 18 mcg into inhaler and inhale 2 (two) times daily.      .  vitamin C (ASCORBIC ACID) 500 MG tablet Take 500 mg by mouth 2 (two) times daily.       Marland Kitchen zinc gluconate 50 MG tablet Take 100 mg by mouth every morning.      . zolpidem (AMBIEN) 10 MG tablet Take 1 tablet (10 mg total) by mouth at bedtime as needed for sleep.  30 tablet  5   No current facility-administered medications for this encounter.  Lasix only as needed.   Allergies  Allergen Reactions  . Sulfonamide Derivatives Swelling    PHYSICAL EXAM: Filed Vitals:   07/01/12 1508  BP: 108/58  Pulse: 70  Weight: 141 lb (63.957 kg)  SpO2: 90%     General:  Well appearing. No respiratory difficulty,  Dyspnea with exertion noted HEENT: normal Neck: supple. JVP 7-8 . Carotids 2+ bilat; no bruits. No lymphadenopathy or thryomegaly appreciated. Cor: PMI nondisplaced. Regular rate & rhythm. No rubs, gallops or murmurs. Lungs: CTA Abdomen: soft, nontender, nondistended. No hepatosplenomegaly. No bruits or masses. Good bowel sounds. Extremities: no cyanosis, clubbing, rash,LLE 1+>RLE  trace edema, Neuro: alert & oriented x 3, cranial nerves grossly intact. moves all 4 extremities w/o difficulty. Affect pleasant.   ASSESSMENT & PLAN:

## 2012-07-02 ENCOUNTER — Telehealth: Payer: Self-pay | Admitting: Gastroenterology

## 2012-07-02 DIAGNOSIS — K869 Disease of pancreas, unspecified: Secondary | ICD-10-CM

## 2012-07-03 NOTE — Telephone Encounter (Signed)
I have spoken to patient's caregiver, Shalunda Lindh. A CT scan has been scheduled for 07/17/12 @ 2:00 pm @ [facility]. Patient will need to arrive at 1:30 pm to begin drinking contrast (for Pancreatic CT). She will also need labs (BUN, creatinine) completed around 12:30 pm on 07/17/12 prior to CT (patient is elderly and caregiver prefers having labs completed same day as test). Mr. rebekkah powless understanding of all of the above.

## 2012-07-03 NOTE — Telephone Encounter (Signed)
I have left a message for patient to call back. 

## 2012-07-17 ENCOUNTER — Encounter: Payer: Self-pay | Admitting: Pulmonary Disease

## 2012-07-17 ENCOUNTER — Other Ambulatory Visit: Payer: Self-pay | Admitting: Pulmonary Disease

## 2012-07-17 ENCOUNTER — Ambulatory Visit (INDEPENDENT_AMBULATORY_CARE_PROVIDER_SITE_OTHER)
Admission: RE | Admit: 2012-07-17 | Discharge: 2012-07-17 | Disposition: A | Discharge status: Home / Self Care | Payer: Medicare Other | Source: Ambulatory Visit | Attending: Gastroenterology | Admitting: Gastroenterology

## 2012-07-17 ENCOUNTER — Other Ambulatory Visit (INDEPENDENT_AMBULATORY_CARE_PROVIDER_SITE_OTHER): Payer: Medicare Other

## 2012-07-17 ENCOUNTER — Ambulatory Visit (INDEPENDENT_AMBULATORY_CARE_PROVIDER_SITE_OTHER): Payer: Medicare Other | Admitting: Pulmonary Disease

## 2012-07-17 VITALS — BP 126/74 | HR 82 | Temp 97.1°F | Ht 64.0 in | Wt 148.4 lb

## 2012-07-17 DIAGNOSIS — K5792 Diverticulitis of intestine, part unspecified, without perforation or abscess without bleeding: Secondary | ICD-10-CM

## 2012-07-17 DIAGNOSIS — G894 Chronic pain syndrome: Secondary | ICD-10-CM

## 2012-07-17 DIAGNOSIS — F32A Depression, unspecified: Secondary | ICD-10-CM

## 2012-07-17 DIAGNOSIS — I872 Venous insufficiency (chronic) (peripheral): Secondary | ICD-10-CM

## 2012-07-17 DIAGNOSIS — J9611 Chronic respiratory failure with hypoxia: Secondary | ICD-10-CM

## 2012-07-17 DIAGNOSIS — I5032 Chronic diastolic (congestive) heart failure: Secondary | ICD-10-CM

## 2012-07-17 DIAGNOSIS — F329 Major depressive disorder, single episode, unspecified: Secondary | ICD-10-CM

## 2012-07-17 DIAGNOSIS — J9610 Chronic respiratory failure, unspecified whether with hypoxia or hypercapnia: Secondary | ICD-10-CM

## 2012-07-17 DIAGNOSIS — D649 Anemia, unspecified: Secondary | ICD-10-CM

## 2012-07-17 DIAGNOSIS — K8689 Other specified diseases of pancreas: Secondary | ICD-10-CM

## 2012-07-17 DIAGNOSIS — K869 Disease of pancreas, unspecified: Secondary | ICD-10-CM

## 2012-07-17 DIAGNOSIS — K5732 Diverticulitis of large intestine without perforation or abscess without bleeding: Secondary | ICD-10-CM

## 2012-07-17 DIAGNOSIS — J986 Disorders of diaphragm: Secondary | ICD-10-CM

## 2012-07-17 DIAGNOSIS — K219 Gastro-esophageal reflux disease without esophagitis: Secondary | ICD-10-CM

## 2012-07-17 DIAGNOSIS — Z933 Colostomy status: Secondary | ICD-10-CM

## 2012-07-17 DIAGNOSIS — M1990 Unspecified osteoarthritis, unspecified site: Secondary | ICD-10-CM

## 2012-07-17 DIAGNOSIS — M8580 Other specified disorders of bone density and structure, unspecified site: Secondary | ICD-10-CM

## 2012-07-17 LAB — BUN: BUN: 31 mg/dL — ABNORMAL HIGH (ref 6–23)

## 2012-07-17 LAB — CREATININE, SERUM: Creatinine, Ser: 0.7 mg/dL (ref 0.4–1.2)

## 2012-07-17 MED ORDER — ZOLPIDEM TARTRATE 5 MG PO TABS: 5.0000 mg | ORAL_TABLET | Freq: Every evening | ORAL | Status: DC | PRN

## 2012-07-17 MED ORDER — ZOLPIDEM TARTRATE 10 MG PO TABS: ORAL_TABLET | ORAL | Status: DC

## 2012-07-17 MED ORDER — IOHEXOL 350 MG/ML SOLN
100.0000 mL | Freq: Once | INTRAVENOUS | Status: AC | PRN
  Administered 2012-07-17: 100 mL via INTRAVENOUS

## 2012-07-17 NOTE — Patient Instructions (Addendum)
Today we updated your med list in our EPIC system...    Continue your current medications the same...  Be as active as possible 7 participate in your physical therapy to max advantage to gain strength & stamina...  Call for any questions or if we can be of service in any way...  Let's plan a follow up visit in 29mo, sooner if needed for problems.Marland KitchenMarland Kitchen

## 2012-07-17 NOTE — Progress Notes (Signed)
Subjective:     Patient ID: Katie [facility]van, female   DOB: 12-27-1927, 79 y.o.   MRN: [id number]  HPI  Review of Systems  Physical Exam  Subjective:    Patient ID: Katie [facility], female    DOB: [birth year], 79 y.o.   MRN: [id number]  HPI: 32 y/o WF, mother of Katie Galvan, who moved here from [place] in 2011...  she has multiple medical problems including chronically elev right hemidiaph;  HBP;  Diastolic dysfunction & mild PulmHTN on 2DEcho;  VI & Edema;  Hx Dyspagia & gastritis;  Divertics & ?colon polyp;  Hx UTI & renal failure from dehydration 3/12;  Severe DJD- s/p R.THR/ ?CPPD/ LBP/ Chr Pain Syndrome on MS Contin;  Osteopenia;  Anemia... SEE PREV EPIC NOTES FOR EARLIER DATA>>   ~  February 21, 2011:  72mo ROV & [facility] is stable> she saw [facility] 12/12 & doing well on Lasix40, Aldactone12.5, & Coreg 6.25Bid; he repeated her 2DEcho & her LVF had recovered to 55-60% (LV cavity mildly dil, mild LVH, mild MR, PAsys=55mmHg); he felt her myopericarditis had resolved & he released her from CHF clinic follow up...    Hx Resp Fail> chr elev right hemidiaph, on O2 w/ exerc & Qhs, Advair100 Bid & Spiriva daily; stable & rec to continue same plus incr exerc as able...    HBP> controlled on the Coreg, diuretics, & Lisinopril 2.5mg /d (off prev CCB); BP= 148/88 & she is reminded to elim salt, etc; wt is up 3# to 142# & we will monitor...    Chol> with the mult hospitalizations & rehab stays w/ numerous med reconciliations done- she has stopped her prev Mevacor20; we discussed checking f/u FLP off the statin in the future...    ORTHO> she has persistent discomfort in her hip, right shoulder, back & the chr pain syndrome currently on MSContin 30mg AM & 15mg PM; we discussed trial of decr to 15mg Bid betw now 7 f/u in 3 months...    GYN> she mentioned some vag spotting & apparently has a pessary in place since her last check in [place]; we will refer to GYN for eval...  ~  May 04, 2011:   42mo ROV & add-on for recent incr SOB; she reports fall recently w/ feet swelling now & noted incr SOB; she is on her Oxygen at 1-2L/min, and had f/u Cards 3/13 for her sys & diast heart failure but they kept her diuretics the same> on Lasix 40mg /d but she is instructed to incr to 2/d if she has extra fluid/ edema/ etc...    She also had GYN appt [facility] 2/13 for post menopausal bleeding, pessary for uterine prolapse x yrs (it was changed regularly in [place] but not in >69yr); [facility] said her vag walls & Cx were irritated & friable, he removed pessary & Rx w/ estrace cream, she was to ret in 10d but didn't go (pessary is still out & she has not experienced any descensus so far); she will sched GYN follow up at her convenience... CXR 5/13 showed marked eventration of right hemidiaph (no change), mild cardiomeg, atherosclerotic calcif of tortuous Ao, sl congestion & Atx, etc... LABS 5/13:  Chems- ok w/ BS=125 BUN=14 Creat=0.8;  CBC- ok w/ Hg=12.3;  TSH=1.71;  BNP= 368...  ~  May 23, 2011:  3wk ROV & [facility] is improved- breathing better, less SOB, no chest discomfort, and ?swelling down; she notes better appetite & wt is actually up 4#; we discussed continuing the Lasix 40mg  each AM  but she may incr to 80mg  on any day that swelling doesn't go down overnight; rec regular exercise etc & she is getting PT at [facility]...  ~  September 06, 2011:  70mo ROV & post hosp check> She states it all started w/ "falls"- went to ER 07/30/11 after having been found on the floor at [facility], didn't know what happened, no apparent injury etc, ?some speech prob, no focal neuro deficits;  Labs- Hg=11.7, WBC=13.5, BUN/Cr= 30/1.3;  Urine C&S= neg;  CT Head> atrophy & sm vessel dis, no acute infarct or hem;  CXR> chr changes & elev right hemidiaph, NAD;  EKG> SBrady, rate58, NAD;  She was ret to the NH improved...    She was Adm to [facility] 7/31 - 08/07/11 after another "fall" & by report they were occuring at night when trying to get OOB-  syncopal & awakes on the floor, no recollection of the fall etc; no major trauma, no incont, not post ictal, she was mildly bradycardic on Coreg6.25Bid, no signif arrhythmia noted, felt to be prob orthostatic vs vagal;  Labs- Hg=10-11.5, WBC=10-12K, BUN/Cr= 35-21/1.03-0.86, BNP=1420, Urine+Ecoli sens Cipro;  CDopplers> tortuous vessels, no signif stenosis, vertebrals patent w/ antegrade flow;  EKG> SBrady, rate53, NSSTTWA;  2DEcho> mild LVH, norm LVF w/ EF=65-70%, Gr1DD, calcif mitral annulus, mildly thick leaflets, mild MR, mild LA dil, mild calcif AoV leaftlets, no AS/AI;  She was disch to SNF at [facility] & meds adjusted==>They reduced Coreg3.125Bid, reduced MSContin15Bid, reduced Ambien5prn...    In the NH they f/u labs and found BUN=55, Creat=1.13 and Lasix, Aldactone, Lisinopril HELD; subseq Labs improved & restarted Aldactone25-1/2tab & Lisinopril5mg /d; final Labs 08/31/11 showed BUN=27, Creat=0.94...    Since ret to her apt she is improved- not dizzy, not postural, no recurrent falls or syncpe; Pulm & Cardiac ROS is neg & she is ambulating w/ walker, still has hip pain & c/o new right shoulder pain w/ decr ROM (may have hit it during one of her prev falls); [facility] will call [facility] for Ortho eval;  They also mentioned decr hearing (she says OK) & I encouraged them to get Audiology eval at ENT office [facility]...  ~  October 09, 2011:  2mo ROV & son reports that she's continued to be weak & has lost 9# to 149# today; they feel sl better since she has cut Aldactone25 in half & stopped the Lasix40- now just using it 'prn" & none recently; we discussed need for incr exercise, daily physical activity, & she has f/u w/ her orthopedist soon...    We reviewed prob list, meds, xrays and labs> see below for updates >> she has already had the 2013 Flu vaccine...  ~  Galvan 13, 2013:  2mo ROV & post hosp visit>  Hosp again 10/29- 11/03/11 this time w/ diverticulitis- presented w/ LLQ pain x1d, WBC elev at 18K, CT  showed sigmoid diverticulitis (wall thickening & inflamm changes); treated w/ Cipro/ Flagyl, slowly advanced diet, etc; NOTE> CT Abd also revealed hypodense mass in pancreas at the uncinate process measuring 3.7 x 1.4cm c/w cystic neoplasm; she saw Amy in GI post hosp> and was improved, they extended the cipro/ flagyl for 7d more & set her up to see DrJacobs for poss endoscopic ultrasound & bx (Note> CEA=2.7,  Ca19-9= 7.2 w/norm<35)...    She has continued close f/u in CHF clinic w/ [facility] PAs> seen 11/09/11 & they adjusted Lasix for her vol status to 40mg /d if wt>143#; and they plan 2 wk holter monitor.Marland KitchenMarland Kitchen  She is still feeling miserable- exhausted & even too tired to shower etc; daughter-in-law wonders about depression & we offered low dose medication to see if this helps but ultimately she declined new meds and we will observe    We reviewed prob list, meds, xrays and labs> see below for updates >>   ~  January 09, 2012:  29mo ROV & Vernell has had several f/u visits w/ her specialists as noted below;  Her CC= SOB but she looks to be at baseline, comfortable & in NAD; when pressed on the symptoms she describes SOB at rest & activity, trouble getting the air "in- like I can't get a deep breath"; she specifally denies cough, sput, chest congestion, etc and has not had f/c/s; in addition she denies CP, palpit, worsening edema, PND, etc but she doesn't like to lie flat- I offered hosp bed for positioning but she declines; note that exam is clear- elev right hemidiaph w/o change but no wheezing, rales, rhonchi, & cardiac exam unchanged as well... We decided to treat her symptom w/ low dose Klonopin 0.25mg - 1/2 tab twice daily as needed for the dyspnea...    She has had several f/u visits in the CHF clinic- last 01/01/12 for her mixed sys/diastolic HF> last 2DEcho 8/13 showed recovery w. EF=65-70%, Gr1DD, mildMR, mild LAdil; they changed Coreg to BISOPROLOL5mg , and added ALDACTONE 25mg /d; they are checking  blood work at home 7 she doesn't want additional labs from [place] today; she is rec to continue f/u in their clinic...    She has VI, stasis changes and chronic bilat LE leg wounds L>R; they are been attended by the visiting nurses & [facility] staff...    She saw DrJacobs for GI 11/13> Diverticulitis resolved, remote colonoscopy reported normal, the CT mass in the pancreas appeared to be similar to prev scan (10/12 CT Abd report "low attenuation along the uncinate process is nonspecific"- no change per DrJacobs & he felt this was likely a benign lesion & he did not favor aggressive work up or Bx- plans f/u CT in 6 months...    She had a 86mo f/u appt w/ DrMohammed due to her anemia (ACD +Fe defic)> he noted Hg= 8.5-9.3 during her diverticulitis hosp; Hg was improved to 10.7 on oral Fe supplement- he rec continued oral Iron & f/u prn... We reviewed prob list, meds, xrays and labs> see below for updates >> we signed for a handicap sticker...  ~  April 17, 2012:  7mo ROV & Lamaria was Adm to the hosp [facility]) for 34mo 3/14 w/ diverticulitis & ultimately had surg w/ left hemicolectomy & colostomy by DrToth, she had prolonged post-op course w/ ileus & TNA, disch 3/25 back to [facility] for rehab; slowly improving & getting her strength back- she denies brweathing problem, CP, palpit, swelling etc (she has lost 17# down to 134# today... We reviewed the following medical problems during today's office visit >>     HxDyspnea, Elev right hemidiaph, Chr resp insuffic> on Claritin, Symbicort160, Spiriva; she is off her HomeO2 since disch & breathing satis w/ less dyspnea, getting PT & ambulating w/ walker etc...    HxHBP, HxMyopericarditis, Chr diastolic CHF> on RUE45, Metop25Bid, Lasix40-1/2 & Aldactone25-1/2 (on this at disch 3/14); BP= 106/72 & slowly improving; she had several f/u visits in the CHF clinic w/ several med changes- last 01/01/12 for her mixed sys/diastolic HF; last 2DEcho 8/13 showed recovery w/ EF=65-70%, Gr1DD,  mildMR, mild LAdil...    VI, Stasis changes, Edema>  prev venous stasis ulcers are healed & being managed by nursing staff at [facility] (& the wound doc who comes once per week)- they have her on ZincGluconate 100mg /d...    Hx Hyperlipidemia> she has been off meds x 80yrs and never had f/u FLP; during the 3/14 hosp her Chol= 91-167 and TG=80-171 on TNA...    Hx HH, Gastitis, neg HPylori> on Protonix40, Zofran4; currently denies abd pain, dysphagia, n/v, etc...    Divertics, s/p left colon resection 3/14 w/ colostomy, Hx colon polyps> Adm 3/14 w/ recurrent diverticulitis & ultimately had left colon resection w/ colostomy by DrToth; stable post op 7 saw [facility] 4/14 w/ incision healed, colostomy ok, doing satis w/ rehab...     Cystic pancreatic mass> CT mass in the pancreas appeared to be similar to prev scan (10/12 CT Abd report "low attenuation along the uncinate process is nonspecific"- no change per DrJacobs & he felt this was likely a benign lesion & he did not favor aggressive work up or Bx- plans f/u CT in 6 months...    DJD, s/p rightTHR & subseq revion of acetab component, s/p left wrist fx w/ surg, c/o shoulder pain> she is off of her narcotic analgesics & just using OTC meds as needed...    Osteopenia> on Calcium, MVI, VitD, etc...     Hx Depression & Insomnia> on Celexa 20mg /d & prev Ambien as needed...    Hx Anemia> Hx ACD +Fe defic> on Fe-Bid + VitC, hosp labs showed Hg in the 8-10 range; she had blood work at [facility] w/ Hg=14 (ok to decr the Fe to one daily) We reviewed prob list, meds, xrays and labs> see below for updates >>  We discussed getting her CMet & Fe levels checked...  ~  July 17, 2012:  38mo ROV & [facility] is back in her apt in independent living at [facility]- however she looks quite frail & more SOB; son describes a very busy day today w/ labs, CT Abd per DrJacobs, & then this appt; she has staff bathe her, home PT 2d per week, [facility] comes daily to tend to her needs but feels she  is ok w/ ADLs; she apparently fell recently over her walker w/ sm hematoma right shin- told to apply ice prn & it will takes weeks to resolve on it's own... Her CC is insomnia & Ambien 5mg  does not help- she wants to incr to 10mg  Qhs and she is cautioned regarding side effects in an 79 y/o lady; NOTE> she is off all narcotics now, still c/o knee pain/ shoulder pain and [facility] plans to take her to [facility] for repeat eval...    She's had several f/u visits w/ [facility] & saw DrToth 5/14- s/p left & sigmoid colectomy for diverticulitis, ostomy working well & asked to ret prn; she is hoping for ostomy reversal in the near future...     She had f/u in [facility]'s CHF clinic 6/14> EF had recovered & last 2DEcho 8/13 showed EF=65-70%, Gr1DD, mildMR, mild LAdil; volume status was good, no ch in meds, she had mod DOE & encouraged to wear O2 more regularly...     She had f/u CT Abd today per DrJacobs- f/u cystic pancreatic mass> it revealed complex area of hypoattenuation in head of thepancreas- sl larger than prev, no lymphadenopathy; 7mm nonobstructing stone in lower pole collecting sys to left kidney, mult sm cysts bilat kidneys; colostomy in LUQ, extensive atherosclerotic change in Ao w/o aneurysm; elev right hemidiaph, atherosclerotic changes in  coronaries, mild scarring at lung bases...     We reviewed prob list, meds, xrays and labs> see below for updates >>    HOSPITALIZATIONS: ~  [facility] 11/3 - 11/08/09 after fall at home w/ comminuted left wrist fx & had surg by DrKuzma ~  [facility] 3/11 - 03/17/10 w/ UTI, dehydration & renal insuffic> back to norm w/ hydration; also anemic, Fe defic & GI eval DrDBrodie w/ HH/ gastritis ~  [facility] 4/6 - 04/18/10 by [facility] for right THR & required a revision of the acetabular component 4d after the initial surg... ~  [facility] 6/2 - 06/13/10 w/ hypoxemic acute resp failure precipitated by fluid overload & diastolic CHF (BNP=2000) in assoc w/ her chr elev right hemidiaph, RLL atx/ scarring, &  scoliosis w/ multilevel degen disc dis(w/ resultant restrictive lung disease)... ~  [facility] 10/7 - 10/19/10 w/ abd pain/ N/ V/ D & found to have ?colitis ?presumed infectious & resolved w/ Cipro/ Flagyl; also had CP & abn EKG/ Enz felt to be a myopericarditis... ~  [facility] 7/31 - 08/07/11 w/ fall/syncope ?etiology- felt to be poss orthostatic vs vagal; Neuro & CV evals were unrevealing, meds adjusted & improved... ~  [facility] 10/29- 11/03/11 w/ diverticulitis- presented w/ LLQ pain x1d, WBC elev at 18K, CT showed sigmoid diverticulitis (wall thickening & inflamm changes); treated w/ Cipro/ Flagyl, slowly advanced diet, etc; GI extended the course of antibiotics for 1 more week; also has cystic pancreatic mass & referred to [facility] for EUS, pos bx. ~  Hosp 2/27 - 03/26/12 w/ diverticulitis & ultimately went for left hemicolectomy w/ colostomy by DrToth; post op complic by Ileus & need for TNA; disch 3/25 back to [facility] for rehab...           Problem List:    ACUTE ON CHRONIC RESP INSUFFICIENCY>  Precipitated 6/12 by diastolic CHF superimposed on her chronic resp problems etc... Now on ADVAIR100 Bid, SPIRIVA daily, O2 2L/ min... DIAPHRAGMATIC DISORDER (ICD-519.4) - she has a chronically elev right hemidiaph, apparently idiopathic; and she is mostly asymptomatic w/o cough, phlegm, hemoptysis, ch in SOB, etc... ~  11/11:  CXR showed calcif Ao, elev right hemidiaph, mild scarring at bases, NAD... ~  6/12:  Presented to ER w/ hypoxemic resp failure due to vol overload assoc w/ her restrictive lung dis & chr pain syndrome requiring narcotic analgesics... ~  7/12:  O2 sats improved & OK to cut back the Oxygen to prn... ~  10/12:  Hosp w/ ?colitis and ?myopericarditis> developed fluid retention, CHF, abn EKG/ Enz & 2DEcho> seen by [facility] ==> now followed in the CHF clinic/ [facility] & back on Oxygen regularly ==> weaned to exerc & Qhs. ~  5/13:  She is feeling better & wonders if she needs the O2; offered to check  ambulatory O2 here & poss ONO at [facility] but she wants to wait... ~  7/13:  CXR showed chr changes & elev right hemidiaph, NAD... ~  11/13:  CXR showed borderline heart size & calcif Ao, elev right hemidiaph, right base atx, NAD.Marland Kitchen. ~  CXRs 3/14 hosp showed elev right hemidiaph & stable right basilar atx  Hx HYPERTENSION (ICD-401.9) VALVULAR HEART DISEASE (ICD-424.90) > prev trivial AI, mild MR... Hx of CARDIAC ARRHYTHMIA (ICD-427.9)  EPISODE of MYOPERICARDITIS w/ decr LVF> see 10/12 Hosp... ~  she was on ASA81, Diltiazem240, Vasotec20, & LASIX 40mg /d; but meds changed during the 10/12 Hosp to ASA 81mg /d, LISINOPRIL 2.5mg /d, LASIX 20mg /d ==> freq med adjustments/ titrations from  [facility] CHF clinic... ~  11/11 & 6/12:  EKG showed NSR, NSSTTWA, NAD... ~  2DEcho 6/12 showed mild LVH, norm LVF w/ EF=55-60%, Gr 1 DD, trivial AI, heavily calcif mitral annulus & mild MR, PAsys est . ~  10/12:  SEE ABOVE + EChart records... Hosp w/ ?myopericarditis, elev enz, abn 2DEcho & meds adjusted> now followed freq via the CHF clinic/ [facility] w/ freq med adjustments... ~  12/12:  Now much improved on COREG 6.25Bid, LISINOPRIL 2.5mg /d, LASIX 40mg /d, SPIRONOLACTONE 12.5mg /d; wt down to 139# & BNP= 119... ~  Repeat 2DEcho 12/12 showed LVF recovered to 55-60% (LV cavity mildly dil, mild LVH, mild MR, PAsys=48mmHg). ~  5/13: presents w/ incr SOB & eval shows wt=149# & BNP= 368 despite Lasix40 & Aldactone12.5; BP is also elev at 178/88; rec to increase Lasix to 80 for a few days ==> she reports improved and back to baseline...  ~  EKG 7/13 showed SBrady, rate53, minor NSSTTWA, otherw ok... ~  8/13: she was [place] [facility] w/ fall/syncope ?etiology> 2DEcho showed mild LVH, norm LVF w/ EF=65-70%, Gr1DD, calcif mitral annulus, mildly thick leaflets, mild MR, mild LA dil, mild calcif AoV leaftlets, no AS/AI; now on ASA81, COREG6.25-1/2Bid, LISINOPRIL5, ALDACTONE25-1/2daily... ~  CDopplers 8/13 showed TDS due to anatomy &  tortuosity- no signif extracranial carotid dis, vertebrals are patent & antegrade... ~  10/13:  BP= 110/80 & they are reminded to bring up-to-date list of all meds to every visit... ~  Holter Monitor per [facility] 11/13 showed NSR w/ PACs, no signif arrhythmia... ~  1/14:  BP= 128/66 & she notes some dyspnea but denies CP, palpit,, etc... ~  4/14:  on ASA81, Metop25Bid, Lasix40-1/2 & Aldactone25-1/2 (on this at disch 3/14); BP= 106/72 & slowly improving; she had several f/u visits in the CHF clinic w/ several med changes- last 01/01/12 for her mixed sys/diastolic HF; last 2DEcho 8/13 showed recovery w/ EF=65-70%, Gr1DD, mildMR, mild LAdil ~  7/14:  Same meds, BP= 126/74, she is chr ill appearing & more frail...  VENOUS INSUFFICIENCY >> STASIS CHANGES & LEG ULCERS >> LEG EDEMA, CHRONIC (ICD-782.3) - she has severe venous stasis changes and chronic edema, thickened skin over LE's etc... she knows to be careful w/ hygiene, no salt, elevation, support hose, etc ~  8/12:  Stable on Lasix40, 1+chr edema, wt=137# ~  10/12:  Post hosp check on Lasix20, 3+edema, BNP=483, wt=152#; rec to incr Lasix to 40mg /d... ~  12/12:  meds adjusted as above via CHF clinic & now K=3.6, TCO2=33, BUN=21, Creat=0.9, BNP=119 (her best yet)... ~  5/13:  We discussed adjusting Lasix to 40mg  daily & 80mg  prn for swelling that doesn't go down overnight... ~  8/13:  Post hosp meds adjusted & off Lasix... ~  11/13:  Cards adjusted Lasix to 40mg  prn weight >143# ~  1/14:  Bilat LE ulcers treated by Visiting nurses & [facility] staff w/ dressing changes etc... ~  7/14:  She has chr VI changes, no ulcers at present, new sm ant hematoma right lower leg from fall...  HYPERCHOLESTEROLEMIA (ICD-272.0) - she has been on Mevacor 20mg /d from her [place] physician... ~  Perhaps we can try to get an FLP at [facility] since we cannot get her in for Fasting labs. ~  FLP during the 10/12 hosp showed TChol 146, TG 98, HDL 53, LDL 73 ~  Due to  numerous [place], Rehab stays in [place], & mult med reconciliations- her Mevacor was dropped along the way; wants to leave it off &  check FLP on diet alone. ~  4/14:  she has been off meds x 64yrs and never had f/u FLP; during the 3/14 hosp her Chol= 91-167 and TG=80-171 on TNA.  OTHER DYSPHAGIA (ICD-787.29) - she was on PRILOSEC 20mg /d & reports occas dysphagia but denies choking etc... she apparently has never had an Endoscopy or GI eval for this problem, "I have to be careful when I eat"... ~  6/12:  NOTE she had EGD 3/12 showing HH, gastritis, neg HPylori> rec increase PPI to Bid. ~  10/12:  She was discharge off her PPI therapy ==> she denies reflux symptoms. ~  8/13:  She states stable w/o abd pain, n/v, dysphagia, etc...  DIVERTICULOSIS OF COLON (ICD-562.10) & Recurrent Diverticulitis w/ left colon/ sigmoid resction, & colostomy 3/14 by DrToth... Hx of COLONIC POLYPS (ICD-211.3) - she tells me that prev colonoscopy in [place] showed divertics & ?polyp but she does not recall any details...  she denies much constipation despite the narcotic analgesics- uses prunes as needed. ~  [facility] 10/12 w/ ?infectious colitis> resolved after Cipro/ Flagyl Rx ==> uses Miralax prn due to narcotics. ~  [facility] 10/13 w/ diverticulitis- presented w/ LLQ pain x1d, WBC elev at 18K, CT showed sigmoid diverticulitis (wall thickening & inflamm changes); treated w/ Cipro/ Flagyl, slowly advanced diet, etc; GI extended the course of antibiotics for 1 more week; also has cystic pancreatic mass & referred to [facility] for EUS, pos bx. ~  [facility] 2/27 - 03/26/12 w/ recurrent diverticulitis & ultimately underwent surg w/ left colon & sigmoid resection w/ colostomy by DrToth... CT Abd 3/14 revealed chr bowel wall thickening & inflamm in the sigm region; incidental findings included bilat renal cysts, s/p hyst...  ~  4/14: post op f/u by DrToth> doing satis in [place] at [facility] re[facility], incision healed, ostomy is good... ~  7/14: she is back to  independ living w/ [facility] caring for her daily, staff helps her bathe, getting PT...  CYSTIC PANCREATIC MASS >> seen on CT Abd 10/13 when adm for diverticulitis; referred to [facility] for outpt evaluation & seen ~  CT Abd 10/13 showed hypodense mass at the uncinate process of the pancreas felt to represent a cystic neoplasm; mult other findings- sigmoid divertic colitis, atrophic kidneys w/ cysts and 5mm stone in lower pole of left kidney, atherosclerosis... ~  CT Abd 7/14 per DrJacobs> complex area of hypoattenuation in head of thepancreas- sl larger than prev, no lymphadenopathy; 7mm nonobstructing stone in lower pole collecting sys to left kidney, mult sm cysts bilat kidneys; colostomy in LUQ, extensive atherosclerotic change in Ao w/o aneurysm; elev right hemidiaph, atherosclerotic changes in coronaries, mild scarring at lung bases.   DEGENERATIVE JOINT DISEASE (ICD-715.90) - she has severe osteoarthritis w/ XRays 11/11 showing severe osteopenia, severe right hip degeneration & relative sparing of the left hip joint;  DJD knees w/ ?CPPD, abn patella;  left wrist fx- radial  head & ulnar styloid... she had surg left wrist by DrKKuzma... ~  4/12:  [facility] did right THR but required revision of acetabular component 4d later> went to [facility] for rehab... ~  [facility] continues to follow w/ shots as needed for bursitis she says... ~  10/12:  She is c/o right shoulder pain & decr ROM, she wonders if this might be the cause of her chest discomfort; she will f/u w/ Ortho for XRays & shot... ~  8/13:  She is again c/o right shoulder pain & wonders if she injured it during  one of her falls; she will f/u w/ [facility] for eval... ~  7/14:  She is c/o knee & shoulder pain, off all narcotics now, [employer] f/u [facility], Ortho...  GYN >>  ~  2/13: pt mentioned some spotting & apparently has a pessary in place since her days in [place]; she is in need of GYN eval & check up & we will refer... ~  2/13:  she saw Katie Galvan for GYN> said her vag walls & Cx were irritated & friable, he removed pessary & Rx w/ estrace cream, she was to ret in 10d but didn't go (pessary is still out & she has not experienced any descensus so far); she will sched GYN follow up at her convenience...  Hx of BACK PAIN, LUMBAR (ICD-724.2) CHRONIC PAIN SYNDROME (ICD-338.4) - she has chronic pain- mostly from her arthritic condition- and was started on MSContin by her LMD in [place] in the summer of 2011> dose slowly increased & she was on 90mg  Bid (taking a 60mg  tab + 30mg  tab Bid)... she does not believe that this med has anything to do w/ her fall & wrist fx, or her complaints of decr memory etc... we discussed the need to wean off this medication & try to deal w/ her arthritis pain thru an Orthopedist or poss a Pain Clinic... ~  11/11:  she notes pain worse during the day while up & about> try to decr MSContin to 90mg AM & 60mg PM... ~  1/12 & 4/12:  she is down to 60mg Bid & we discussed weaning further ==> she was down to 45mg  (30+15) Bid by the time of her right THR... ~  6/12:  Recommend that she try to wean further> try 45am & 30pm... ~  7/12:  She's down to 30mg  Bid & will wean further as able... ~  10/12:  She remains on the MSContin at 30mg Bid... ~  12/12:  We discussed further slow wean down to 30-15 first... ~  2/13:  We discussed trying to wean down to 15mg  Bid over the next few months, but she was unable to do so; states that Katie Galvan feels it's due to her back... ~  8/13:  She is down to MSContin 15mg Bid since her recent hosp... ~  11/13:  Hospitalists changed her MSContin to OXYCODONE & she was discharged on this... ~  4/14:  She is now post hosp for diverticulitis & left colon resection w/ colostomy; she was disch off of her narcotic analgesics & doing well...  OSTEOPENIA (ICD-733.90) - XRays here showed severe osteopenia... she notes that she used to be on Fosamax but ?how long? & when stopped?... she states  that she was told her prev BMD was "good"... she has not been taking calcium supplements, but was prev on some OTC Vit D supplements... asked to resume Calcium, Women's MVI, Vit D 1000u daily...  Hx of DEPRESSION (ICD-311) - on CELEXA 20mg /d and she wants to continue this med... INSOMNIA >> on AMBIEN 10mg - 1/2 tab as needed...  ANEMIA, MILD (ICD-285.9) - Hg post op 11/11 wrist fx surg = 11.2 ~  6/12:  Labs showed Hg= 9.1, MCV= 77, Fe= 15 (7%sat), & she has B pos blood> we decided to incr Fe to Bid (may need IV Fe if not responding). ~  7/12:  Labs showed Hg= 10.0, MCV= 76, she reports stool checks at [facility] were NEG... ~  8/12: she reports that she had f/u Heme eval DrMohammed & "he released me" on Fe + VitC  daily... ~  10/12:  Labs post hosp showed Hg= 11.0, MCV= 87 ~  Labs 5/13 showed Hg= 12.3 ~  10/13:  Labs during her Hosp for Diverticulits showed Hg down to 8.5; Disch on Fe supplementation... ~  11/13: she had f/u DrMohammed & Hg up to 10.7, rec to continue Fe... ~  4/14:  on Fe-Bid + VitC, hosp labs showed Hg in the 8-10 range; she had blood work at [facility] w/ Hg=14 (ok to decr the Fe to one daily)   Past Surgical History  Procedure Laterality Date  . Appendectomy    . Breast surgery      biopsy  . Left wrist surgery    . Bilateral cataracts    . Hip surgery      dr. Rayburn Ma, twice because the bones were spongy  . Tonsillectomy    . Adenoidectomy    . Colostomy revision N/A 03/14/2012    Procedure: COLON RESECTION SIGMOID;  Surgeon: Robyne Askew, MD;  Location: [facility];  Service: General;  Laterality: N/A;  left colectomy with mobilization of splenic flexure  . Colostomy N/A 03/14/2012    Procedure: COLOSTOMY;  Surgeon: Robyne Askew, MD;  Location: [facility];  Service: General;  Laterality: N/A;    Outpatient Encounter Prescriptions as of 07/17/2012  Medication Sig Dispense Refill  . aspirin 81 MG EC tablet Take 81 mg by mouth every morning.       .  budesonide-formoterol (SYMBICORT) 160-4.5 MCG/ACT inhaler Inhale 1 puff into the lungs 2 (two) times daily.      . Calcium Carbonate-Vit D-Min (CALTRATE 600+D PLUS) 600-400 MG-UNIT per tablet Chew 1 tablet by mouth 2 (two) times daily.       . cholecalciferol (VITAMIN D) 1000 UNITS tablet Take 2,000 Units by mouth every morning.       . citalopram (CELEXA) 20 MG tablet Take 1 tablet (20 mg total) by mouth every morning.  30 tablet  6  . clotrimazole-betamethasone (LOTRISONE) cream Apply topically 2 (two) times daily.  30 g  11  . ferrous sulfate 325 (65 FE) MG tablet Take 325 mg by mouth daily with breakfast.       . furosemide (LASIX) 40 MG tablet Take 0.5 tablets (20 mg total) by mouth daily.  30 tablet  5  . loratadine (CLARITIN) 10 MG tablet Take 10 mg by mouth every morning.       . metoprolol tartrate (LOPRESSOR) 25 MG tablet Take 1 tablet (25 mg total) by mouth 2 (two) times daily.  60 tablet  6  . Multiple Vitamin (MULTIVITAMIN WITH MINERALS) TABS Take 1 tablet by mouth every morning.       . ondansetron (ZOFRAN-ODT) 4 MG disintegrating tablet Take 4 mg by mouth every 8 (eight) hours as needed for nausea.      . pantoprazole (PROTONIX) 40 MG tablet Take 1 tablet (40 mg total) by mouth daily.  30 tablet  6  . polyethylene glycol (MIRALAX / GLYCOLAX) packet Take 17 g by mouth daily.      . Probiotic Product (ALIGN) 4 MG CAPS Take 1 capsule by mouth every morning.      Marland Kitchen spironolactone (ALDACTONE) 25 MG tablet Take 12.5 mg by mouth every morning.      . tiotropium (SPIRIVA) 18 MCG inhalation capsule Place 18 mcg into inhaler and inhale 2 (two) times daily.      . vitamin C (ASCORBIC ACID) 500 MG tablet Take 500 mg by  mouth 2 (two) times daily.       Marland Kitchen zinc gluconate 50 MG tablet Take 100 mg by mouth every morning.      . zolpidem (AMBIEN) 5 MG tablet Take 1 tablet (5 mg total) by mouth at bedtime as needed for sleep.  30 tablet  5   No facility-administered encounter medications on file as  of 07/17/2012.    Allergies  Allergen Reactions  . Sulfonamide Derivatives Swelling    Current Medications, Allergies, Past Medical History, Past Surgical History, Family History, and Social History were reviewed in [employer] record.    Review of Systems: Constitutional:  Denies F/C/S, anorexia, unexpected weight change. HEENT:  No HA, visual changes, earache, nasal symptoms, sore throat, hoarseness. Resp:  No cough, sputum, hemoptysis; mild SOB/ DOE noted... Cardio:  No CP, palpit, orthopnea;  +edema & DOE but limited mobility. GI:  Denies N/V/D, tends toward constip due to narcotics; swallowing OK & denies reflux or abd pain.  GU:  No dysuria, freq, urgency, hematuria, or flank pain. MS:  Severe DJD esp right hip w/ pain & decr ROM ==> s/p right THR now... Neuro:  No tremors, seizures, dizziness, syncope; +weakness & multifactorial gait abn. Skin:  No suspicious lesions or skin rash. Heme:  No adenopathy, bruising, bleeding. Psyche: Denies confusion, sleep disturbance, hallucinations; +anxiety & situational depression.    Objective:  Physical Exam:  WD, WN, 79 y/o WF chr ill appearing but in NAD... Vital Signs:  Reviewed... General:  Alert & oriented; pleasant & cooperative... HEENT:  [place]/AT, EOM-wnl, PERRLA, EACs-clear, TMs-wnl, NOSE-clear, THROAT-clear & wnl. Neck:  Supple w/ decr ROM; no JVD; normal carotid impulses w/o bruits; no thyromegaly or nodules palpated; no lymphadenopathy. Chest:  Clear to P & A w/ decr BS at right base; without wheezes/ rales/ or rhonchi heard... Heart:  Regular Rhythm; norm S1 & S2, gr 1/6 SEM w/o rubs or gallops detected... Abdomen:  Soft & nontender; normal bowel sounds; no organomegaly or masses palpated... Ext:  decrROM; +deformities & mod arthritic changes; no varicose veins, +venous insuffic & 1+edema;  Pulses intact w/o bruits... s/p right THR w/ revision; ambulates w/ walker... Neuro:  CNs intact;  No focal neuro  deficits, in wheelchair & painful standing & walking... Derm:  No lesions noted; no rash etc... Lymph:  No cervical, supraclavicular, axillary, or inguinal adenopathy palpated...   RADIOLOGY DATA:  Reviewed in the EPIC EMR & discussed w/ the patient...  LABORATORY DATA:  Reviewed in the EPIC EMR & discussed w/ the patient...   Assessment & Plan:    Hx Acute on Chr Resp Failure 6/12 Hosp>  Multifactorial w/ diastolic CHF, elev right hemidiaph, atx, scoliosis, restrictive lung dis, narcotic pain meds- all playing a role... She was on oxygen as needed at rest & 2L/min w/ exercise; O2 was discontinued at [facility] after her 3/14 admission & states she is doing satis...  Elev right hemidiaph>  This is chronic & assoc w/ some mild basilar atx; encouraged to get deep breaths & expand well...  HBP, etc>  She has had mult adjustments in her meds in & out of Hosp & thru the CHF clinic... Now controllled on her Metop, Lasix, Aldactone.  Hx of Myopericarditis w/ abn EKG/ Enz & subseq vol overload w/ abn 2DEcho>  She improved back on her diuretic & meds were adjusted in the CHF clinic; repeat 2DEcho 12/12 w/ recovery of LVF & back to her baseline... F/u 2DEcho 8/13= improved w/ EF=65-70%, Gr1DD.Marland KitchenMarland Kitchen  Diastolic CHF>  She is now on Lasix20 & Aldactone125 & stable...  VI, Edema>  REC- no salt, elevate legs, TED hose, etc...  CHOL>  She has been off all statin rxs for some time now; we will endeavor to get an FLP when able...  GERD, Dysphagia>  She had EGD during prev hosp w/ HH, gastritis & neg HPylori;  Prev on PPI Rx but off now & she feels she is OK, we will follow.  Hx of Colitis- presumed infectious>  Resolved w/ Cipro/ Flagyl prev; then diverticulitis back on these meds & followed by GI for resolution... PANCREATIC LESION>> CT Abd showed cystic pancreatic neoplasm ?etiology; DrJacobs has advocated for conservative eval & Rx... Divertics, Constip>  Now s/p left colon resection w/ colostomy 3/14 by  DrToth & she is doing well post op...  DJD, s/p right THR, Chr Pain Syndrome> she is now off all narcs...  Other medical problems as noted...    GYN> pt c/o spotting & has pessary in place since her last check in [place]; we will help her get a gyn appt ASAP for eval & check up...    Derm> she has a seborrheic dermatitis rash & we will Rx w/ Lotrisone cream...    Anemia> on Fe supplement...   Patient's Medications  New Prescriptions   No medications on file  Previous Medications   ASPIRIN 81 MG EC TABLET    Take 81 mg by mouth every morning.    BUDESONIDE-FORMOTEROL (SYMBICORT) 160-4.5 MCG/ACT INHALER    Inhale 1 puff into the lungs 2 (two) times daily.   CALCIUM CARBONATE-VIT D-MIN (CALTRATE 600+D PLUS) 600-400 MG-UNIT PER TABLET    Chew 1 tablet by mouth 2 (two) times daily.    CHOLECALCIFEROL (VITAMIN D) 1000 UNITS TABLET    Take 2,000 Units by mouth every morning.    CITALOPRAM (CELEXA) 20 MG TABLET    Take 1 tablet (20 mg total) by mouth every morning.   CLOTRIMAZOLE-BETAMETHASONE (LOTRISONE) CREAM    Apply topically 2 (two) times daily.   FERROUS SULFATE 325 (65 FE) MG TABLET    Take 325 mg by mouth daily with breakfast.    FUROSEMIDE (LASIX) 40 MG TABLET    Take 0.5 tablets (20 mg total) by mouth daily.   LORATADINE (CLARITIN) 10 MG TABLET    Take 10 mg by mouth every morning.    METOPROLOL TARTRATE (LOPRESSOR) 25 MG TABLET    Take 1 tablet (25 mg total) by mouth 2 (two) times daily.   MULTIPLE VITAMIN (MULTIVITAMIN WITH MINERALS) TABS    Take 1 tablet by mouth every morning.    ONDANSETRON (ZOFRAN-ODT) 4 MG DISINTEGRATING TABLET    Take 4 mg by mouth every 8 (eight) hours as needed for nausea.   PANTOPRAZOLE (PROTONIX) 40 MG TABLET    Take 1 tablet (40 mg total) by mouth daily.   POLYETHYLENE GLYCOL (MIRALAX / GLYCOLAX) PACKET    Take 17 g by mouth daily.   PROBIOTIC PRODUCT (ALIGN) 4 MG CAPS    Take 1 capsule by mouth every morning.   SPIRONOLACTONE (ALDACTONE) 25 MG TABLET     Take 12.5 mg by mouth every morning.   TIOTROPIUM (SPIRIVA) 18 MCG INHALATION CAPSULE    Place 18 mcg into inhaler and inhale 2 (two) times daily.   VITAMIN C (ASCORBIC ACID) 500 MG TABLET    Take 500 mg by mouth 2 (two) times daily.    ZINC GLUCONATE 50 MG TABLET  Take 100 mg by mouth every morning.   ZOLPIDEM (AMBIEN) 5 MG TABLET    Take 1 tablet (5 mg total) by mouth at bedtime as needed for sleep.  Modified Medications   No medications on file  Discontinued Medications   No medications on file

## 2012-07-18 DIAGNOSIS — Z933 Colostomy status: Secondary | ICD-10-CM | POA: Insufficient documentation

## 2012-07-18 DIAGNOSIS — K219 Gastro-esophageal reflux disease without esophagitis: Secondary | ICD-10-CM | POA: Insufficient documentation

## 2012-07-18 DIAGNOSIS — G894 Chronic pain syndrome: Secondary | ICD-10-CM | POA: Insufficient documentation

## 2012-07-26 ENCOUNTER — Telehealth: Payer: Self-pay | Admitting: Pulmonary Disease

## 2012-07-26 NOTE — Telephone Encounter (Signed)
FL2 has been faxed back to the facility for the pt.  Placed form in scan folder.  Nothing further is needed.

## 2012-07-26 NOTE — Telephone Encounter (Signed)
Called, spoke with Dois Davenport.  She has faxed over FL2.  Asking for SN to review, sign, and fax this back TODAY.  Pt had a knee injection yesterday and can't bear wt.  Leigh, pls advise.  Thank you.

## 2012-07-30 ENCOUNTER — Telehealth: Payer: Self-pay | Admitting: Pulmonary Disease

## 2012-07-30 NOTE — Telephone Encounter (Signed)
Called and spoke with mike, pts son---he stated that last Thursday pts right knee gave out and she was unable to stand---seen by Dr. Jorge Mandril and was given cortisone shot in both knees, and they thought that this was arthritis flare.  [name] stated that this has not helped her very much at all.    pts BP has been elevated in the 180's /80 and today was 168/83, so this has started to come down.    Pt is in full care at [facility]--pt is unable to stand/support herself and she is having to use the wheelchair to get around.  They did check UA and this was clear but the stated that they would not do the urine culture.  i advised mike to call and see if they will do the culture just to make sure she does not have an infection.  [name] wanted to make sure SN did not rec anything else.  SN please advise.    Allergies  Allergen Reactions  . Sulfonamide Derivatives Swelling

## 2012-07-31 NOTE — Telephone Encounter (Signed)
Pt's son [name] called back to add the following: "they" are running a culture for the UTI and he [name]) will call tomorrow with results. [phone]. Hazel Sams

## 2012-08-01 NOTE — Telephone Encounter (Signed)
Called, spoke with [name].  Informed him of below per SN.  He verbalized understanding of this. [name] will call Dr. Magnus Ivan re: knees and will call us back with urine results.  Nothing further needed at this time.

## 2012-08-01 NOTE — Telephone Encounter (Signed)
Per SN---  SN feels that her ortho doctor would be best position to assess this about her knees--Dr. Magnus Ivan.  SN agrees on the culture to be done.  Katie Galvan was going to call us with the results.

## 2012-09-05 ENCOUNTER — Telehealth: Payer: Self-pay | Admitting: Pulmonary Disease

## 2012-09-05 MED ORDER — TIOTROPIUM BROMIDE MONOHYDRATE 18 MCG IN CAPS: 18.0000 ug | ORAL_CAPSULE | Freq: Two times a day (BID) | RESPIRATORY_TRACT | Status: DC

## 2012-09-05 MED ORDER — TIOTROPIUM BROMIDE MONOHYDRATE 18 MCG IN CAPS: 18.0000 ug | ORAL_CAPSULE | Freq: Every day | RESPIRATORY_TRACT | Status: AC

## 2012-09-05 NOTE — Telephone Encounter (Signed)
Per SN---  symbicort is bid and the spiriva is once daily.  New rx has been sent to the pharmacy and med list has been updated.  Katie Galvan is aware to check with pharmacy today.  Nothing further is needed.

## 2012-09-05 NOTE — Telephone Encounter (Signed)
In medication history the spiriva has been refilled once a day and also twice daily. Will forward to SN whether pt is suppose to be taking the spiriva twice daily. Please advise thanks Last OV 07/17/12

## 2012-09-05 NOTE — Telephone Encounter (Signed)
Called and spoke with pts son mike and he stated that she has been taking the symbicort bid and the spiriva bid.  SN please advise if we need to change the spiriva to once daily.  Thanks  Allergies  Allergen Reactions  . Sulfonamide Derivatives Swelling

## 2012-09-05 NOTE — Telephone Encounter (Signed)
Son states she takes twice daily.

## 2012-09-05 NOTE — Telephone Encounter (Signed)
lmomtcb x1 for pt son. It states she takes this BID? Need to confirm?

## 2012-09-19 ENCOUNTER — Ambulatory Visit (HOSPITAL_COMMUNITY): Payer: Medicare Other

## 2012-09-25 ENCOUNTER — Other Ambulatory Visit (HOSPITAL_COMMUNITY): Payer: Self-pay | Admitting: Internal Medicine

## 2012-09-26 NOTE — Telephone Encounter (Signed)
New script needs to go to pharmacy for protonix

## 2012-10-09 ENCOUNTER — Encounter: Payer: Self-pay | Admitting: *Deleted

## 2012-10-09 ENCOUNTER — Encounter: Payer: Self-pay | Admitting: Pulmonary Disease

## 2012-10-09 ENCOUNTER — Encounter (INDEPENDENT_AMBULATORY_CARE_PROVIDER_SITE_OTHER): Payer: Self-pay

## 2012-10-10 IMAGING — CR DG CHEST 2V
2 series · 2 of 2 positions shown · non-contrast
Comparison: 06/04/2010

CLINICAL DATA: Short of breath

CHEST - 2 VIEW

[view not recorded (1 of 2)]
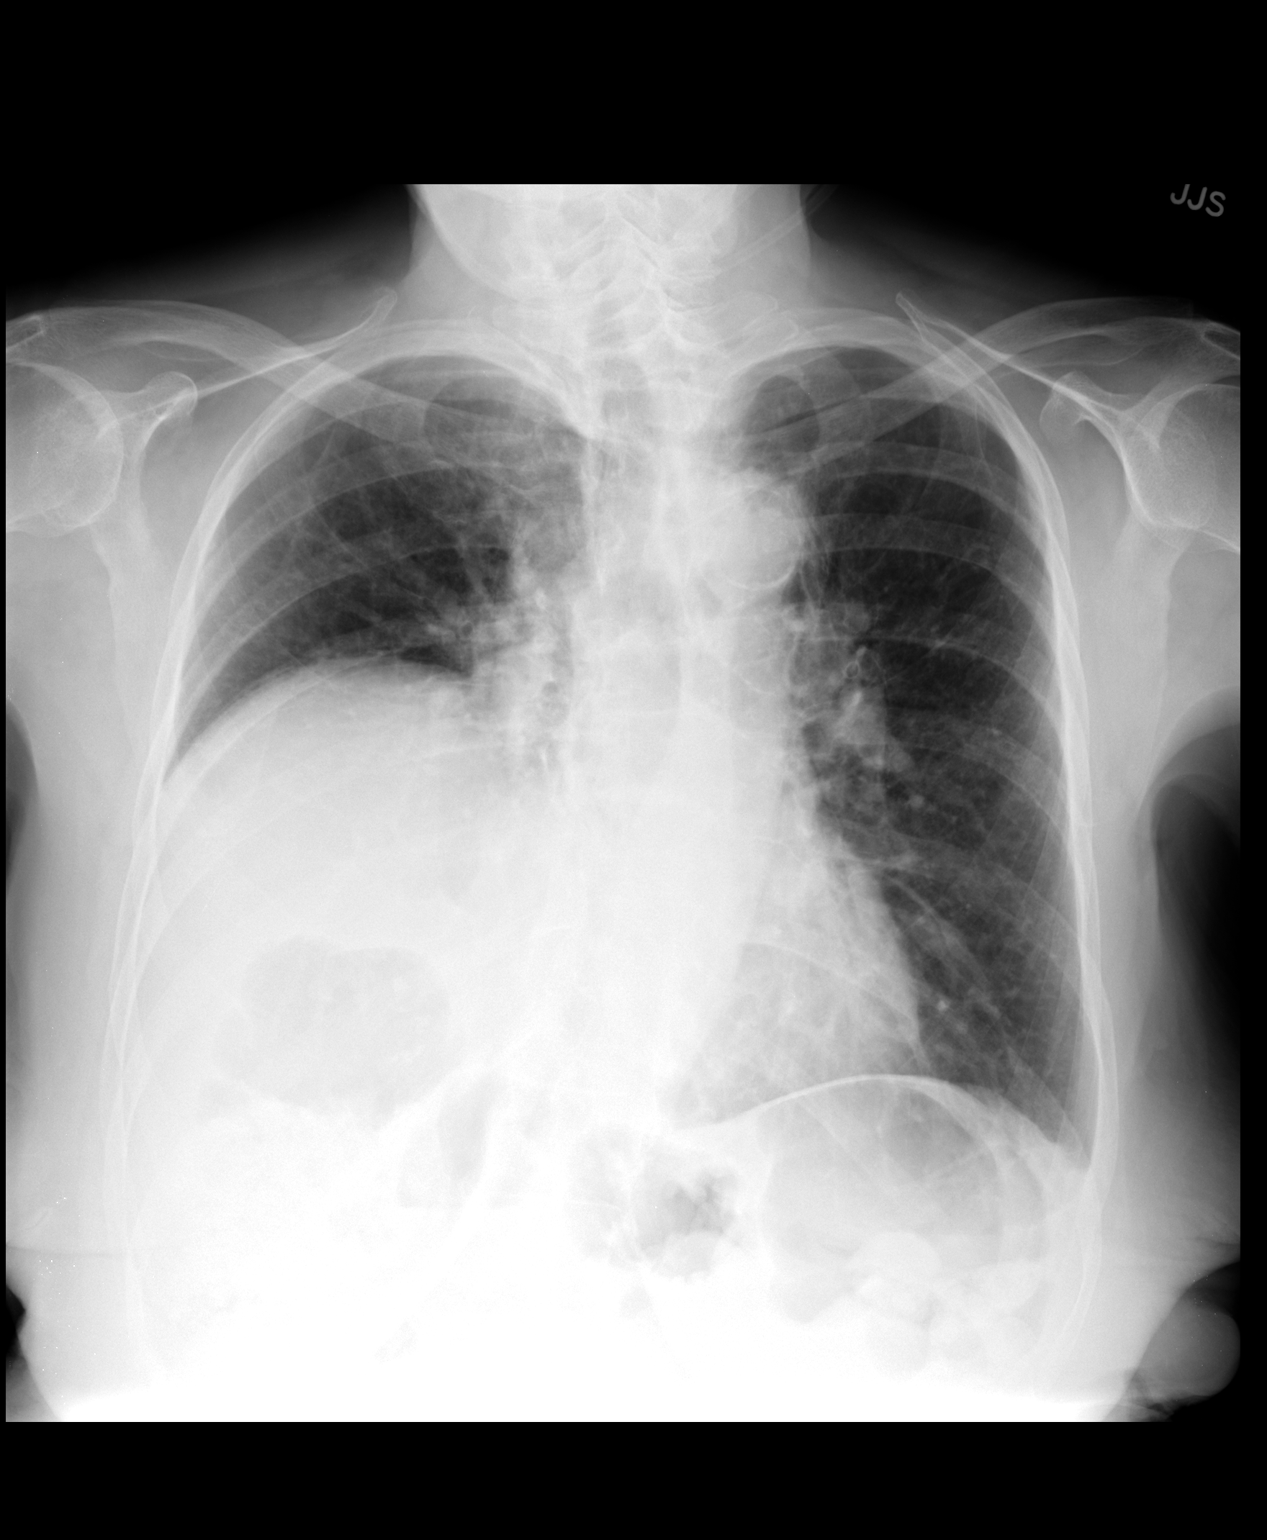

[view not recorded (2 of 2)]
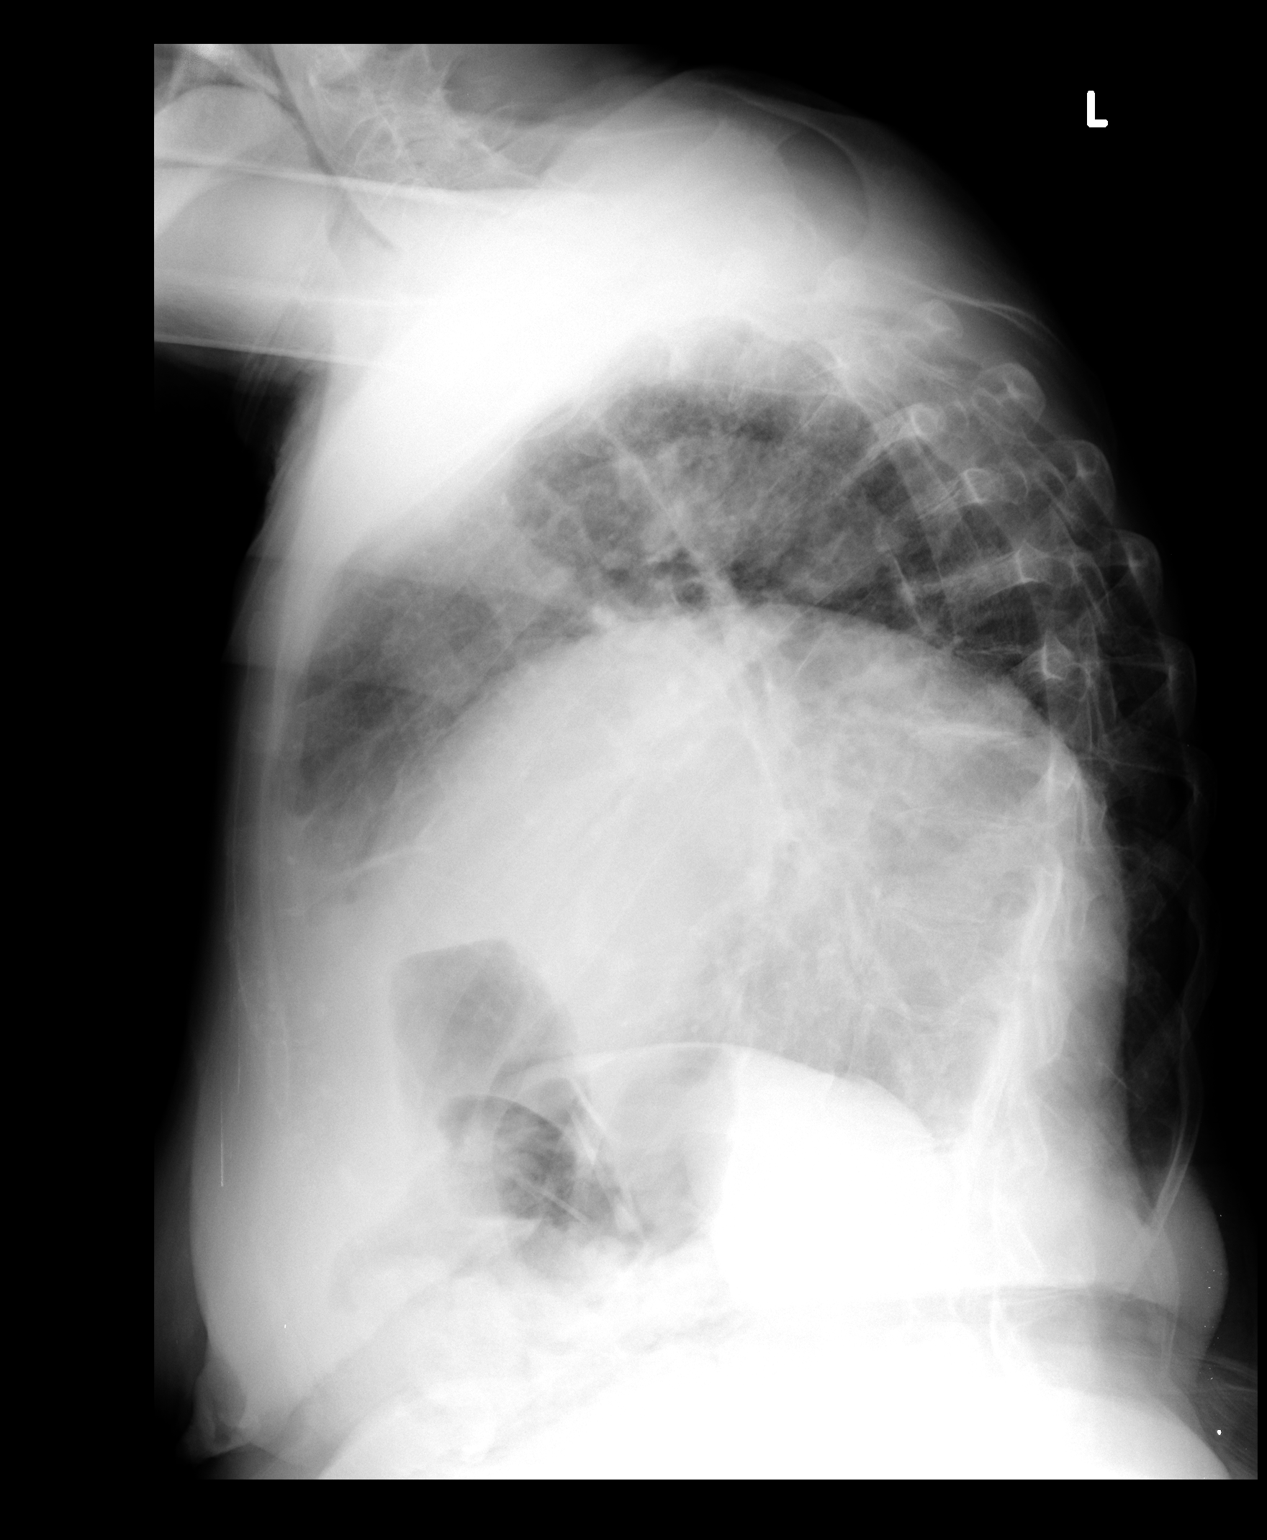

[2 of 2 positions shown; findings below may reference images not displayed]

FINDINGS: Marked elevation of the right hemidiaphragm is unchanged.
Compressive atelectasis in the right lung base is unchanged.  Left
lung remains clear.  No heart failure or pneumonia.
IMPRESSION: Marked elevation of the right hemidiaphragm is stable.  No
superimposed acute abnormality.

## 2012-10-28 ENCOUNTER — Encounter: Payer: Self-pay | Admitting: Pulmonary Disease

## 2012-10-28 ENCOUNTER — Ambulatory Visit (INDEPENDENT_AMBULATORY_CARE_PROVIDER_SITE_OTHER): Payer: Medicare Other | Admitting: Pulmonary Disease

## 2012-10-28 ENCOUNTER — Other Ambulatory Visit (INDEPENDENT_AMBULATORY_CARE_PROVIDER_SITE_OTHER): Payer: Medicare Other

## 2012-10-28 VITALS — BP 118/76 | HR 68 | Temp 97.4°F | Ht 64.0 in | Wt 143.8 lb

## 2012-10-28 DIAGNOSIS — M1990 Unspecified osteoarthritis, unspecified site: Secondary | ICD-10-CM

## 2012-10-28 DIAGNOSIS — R0609 Other forms of dyspnea: Secondary | ICD-10-CM

## 2012-10-28 DIAGNOSIS — M545 Low back pain, unspecified: Secondary | ICD-10-CM

## 2012-10-28 DIAGNOSIS — I509 Heart failure, unspecified: Secondary | ICD-10-CM

## 2012-10-28 DIAGNOSIS — Z23 Encounter for immunization: Secondary | ICD-10-CM

## 2012-10-28 DIAGNOSIS — F419 Anxiety disorder, unspecified: Secondary | ICD-10-CM

## 2012-10-28 DIAGNOSIS — R0902 Hypoxemia: Secondary | ICD-10-CM

## 2012-10-28 DIAGNOSIS — I5032 Chronic diastolic (congestive) heart failure: Secondary | ICD-10-CM

## 2012-10-28 DIAGNOSIS — D649 Anemia, unspecified: Secondary | ICD-10-CM

## 2012-10-28 DIAGNOSIS — F329 Major depressive disorder, single episode, unspecified: Secondary | ICD-10-CM

## 2012-10-28 DIAGNOSIS — Z933 Colostomy status: Secondary | ICD-10-CM

## 2012-10-28 DIAGNOSIS — J9610 Chronic respiratory failure, unspecified whether with hypoxia or hypercapnia: Secondary | ICD-10-CM

## 2012-10-28 DIAGNOSIS — J986 Disorders of diaphragm: Secondary | ICD-10-CM

## 2012-10-28 DIAGNOSIS — M8580 Other specified disorders of bone density and structure, unspecified site: Secondary | ICD-10-CM

## 2012-10-28 DIAGNOSIS — F411 Generalized anxiety disorder: Secondary | ICD-10-CM

## 2012-10-28 DIAGNOSIS — R269 Unspecified abnormalities of gait and mobility: Secondary | ICD-10-CM

## 2012-10-28 DIAGNOSIS — F32A Depression, unspecified: Secondary | ICD-10-CM

## 2012-10-28 DIAGNOSIS — R0600 Dyspnea, unspecified: Secondary | ICD-10-CM

## 2012-10-28 DIAGNOSIS — J9611 Chronic respiratory failure with hypoxia: Secondary | ICD-10-CM

## 2012-10-28 DIAGNOSIS — K219 Gastro-esophageal reflux disease without esophagitis: Secondary | ICD-10-CM

## 2012-10-28 DIAGNOSIS — E538 Deficiency of other specified B group vitamins: Secondary | ICD-10-CM

## 2012-10-28 DIAGNOSIS — I872 Venous insufficiency (chronic) (peripheral): Secondary | ICD-10-CM

## 2012-10-28 DIAGNOSIS — K8689 Other specified diseases of pancreas: Secondary | ICD-10-CM

## 2012-10-28 LAB — CBC WITH DIFFERENTIAL/PLATELET
Basophils Relative: 0.4 % (ref 0.0–3.0)
Eosinophils Absolute: 0.3 10*3/uL (ref 0.0–0.7)
Eosinophils Relative: 2.9 % (ref 0.0–5.0)
HCT: 36.8 % (ref 36.0–46.0)
Hemoglobin: 12.4 g/dL (ref 12.0–15.0)
Lymphs Abs: 1.6 10*3/uL (ref 0.7–4.0)
MCHC: 33.8 g/dL (ref 30.0–36.0)
Monocytes Absolute: 1 10*3/uL (ref 0.1–1.0)
Monocytes Relative: 8.8 % (ref 3.0–12.0)
Neutro Abs: 8.5 10*3/uL — ABNORMAL HIGH (ref 1.4–7.7)
Platelets: 299 10*3/uL (ref 150.0–400.0)
RBC: 4.26 Mil/uL (ref 3.87–5.11)
WBC: 11.5 10*3/uL — ABNORMAL HIGH (ref 4.5–10.5)

## 2012-10-28 MED ORDER — SPIRONOLACTONE 25 MG PO TABS: 12.5000 mg | ORAL_TABLET | Freq: Every morning | ORAL | Status: AC

## 2012-10-28 MED ORDER — PANTOPRAZOLE SODIUM 40 MG PO TBEC: 40.0000 mg | DELAYED_RELEASE_TABLET | Freq: Every day | ORAL | Status: AC

## 2012-10-28 NOTE — Patient Instructions (Signed)
Today we updated your med list in our EPIC system...    Continue your current medications the same...  Today we did your follow up blood work...    We will contact you w/ the results when available...   Continue your "therapy" walking walking walking to get your stamina back...    Let me know if Katie Galvan will allow a new order for PT...  Call for any questions...  Let's plan a follow up visit in 67mo, sooner if needed for problems.Marland KitchenMarland Kitchen

## 2012-10-28 NOTE — Progress Notes (Signed)
Subjective:     Patient ID: Lynann Beaver, female   DOB: 10/30/1927, 79 y.o.   MRN: [id number]  HPI  Review of Systems  Physical Exam  Subjective:    Patient ID: Zariyah Stephens, female    DOB: 03/20/1927, 36 y.o.   MRN: [id number]  HPI: 22 y/o WF, mother of Mollie Rossano, who moved here from [place] in 2011...  she has multiple medical problems including chronically elev right hemidiaph;  HBP;  Diastolic dysfunction & mild PulmHTN on 2DEcho;  VI & Edema;  Hx Dyspagia & gastritis;  Divertics & ?colon polyp;  Hx UTI & renal failure from dehydration 3/12;  Severe DJD- s/p R.THR/ ?CPPD/ LBP/ Chr Pain Syndrome on MS Contin;  Osteopenia;  Anemia... SEE PREV EPIC NOTES FOR EARLIER DATA>>   ~  September 06, 2011:  84mo ROV & post hosp check> She states it all started w/ "falls"- went to ER 07/30/11 after having been found on the floor at [facility], didn't know what happened, no apparent injury etc, ?some speech prob, no focal neuro deficits;  Labs- Hg=11.7, WBC=13.5, BUN/Cr= 30/1.3;  Urine C&S= neg;  CT Head> atrophy & sm vessel dis, no acute infarct or hem;  CXR> chr changes & elev right hemidiaph, NAD;  EKG> SBrady, rate58, NAD;  She was ret to the NH improved...    She was Adm to [facility] 7/31 - 08/07/11 after another "fall" & by report they were occuring at night when trying to get OOB- syncopal & awakes on the floor, no recollection of the fall etc; no major trauma, no incont, not post ictal, she was mildly bradycardic on Coreg6.25Bid, no signif arrhythmia noted, felt to be prob orthostatic vs vagal;  Labs- Hg=10-11.5, WBC=10-12K, BUN/Cr= 35-21/1.03-0.86, BNP=1420, Urine+Ecoli sens Cipro;  CDopplers> tortuous vessels, no signif stenosis, vertebrals patent w/ antegrade flow;  EKG> SBrady, rate53, NSSTTWA;  2DEcho> mild LVH, norm LVF w/ EF=65-70%, Gr1DD, calcif mitral annulus, mildly thick leaflets, mild MR, mild LA dil, mild calcif AoV leaftlets, no AS/AI;  She was disch to SNF at [facility] &  meds adjusted==>They reduced Coreg3.125Bid, reduced MSContin15Bid, reduced Ambien5prn...    In the NH they f/u labs and found BUN=55, Creat=1.13 and Lasix, Aldactone, Lisinopril HELD; subseq Labs improved & restarted Aldactone25-1/2tab & Lisinopril5mg /d; final Labs 08/31/11 showed BUN=27, Creat=0.94...    Since ret to her apt she is improved- not dizzy, not postural, no recurrent falls or syncpe; Pulm & Cardiac ROS is neg & she is ambulating w/ walker, still has hip pain & c/o new right shoulder pain w/ decr ROM (may have hit it during one of her prev falls); [name] will call DrBlackman for Ortho eval;  They also mentioned decr hearing (she says OK) & I encouraged them to get Audiology eval at ENT office DrWolicki...  ~  October 09, 2011:  2mo ROV & son reports that she's continued to be weak & has lost 9# to 149# today; they feel sl better since she has cut Aldactone25 in half & stopped the Lasix40- now just using it 'prn" & none recently; we discussed need for incr exercise, daily physical activity, & she has f/u w/ her orthopedist soon...    We reviewed prob list, meds, xrays and labs> see below for updates >> she has already had the 2013 Flu vaccine...  ~  November 15, 2011:  2mo ROV & post hosp visit>  Hosp again 10/29- 11/03/11 this time w/ diverticulitis- presented w/ LLQ pain x1d, WBC elev at 18K,  CT showed sigmoid diverticulitis (wall thickening & inflamm changes); treated w/ Cipro/ Flagyl, slowly advanced diet, etc; NOTE> CT Abd also revealed hypodense mass in pancreas at the uncinate process measuring 3.7 x 1.4cm c/w cystic neoplasm; she saw Amy in GI post hosp> and was improved, they extended the cipro/ flagyl for 7d more & set her up to see DrJacobs for poss endoscopic ultrasound & bx (Note> CEA=2.7,  Ca19-9= 7.2 w/norm<35)...    She has continued close f/u in CHF clinic w/ DrBensimhon's PAs> seen 11/09/11 & they adjusted Lasix for her vol status to 40mg /d if wt>143#; and they plan 2 wk holter  monitor...     She is still feeling miserable- exhausted & even too tired to shower etc; daughter-in-law wonders about depression & we offered low dose medication to see if this helps but ultimately she declined new meds and we will observe    We reviewed prob list, meds, xrays and labs> see below for updates >>   ~  January 09, 2012:  29mo ROV & Malijah has had several f/u visits w/ her specialists as noted below;  Her CC= SOB but she looks to be at baseline, comfortable & in NAD; when pressed on the symptoms she describes SOB at rest & activity, trouble getting the air "in- like I can't get a deep breath"; she specifally denies cough, sput, chest congestion, etc and has not had f/c/s; in addition she denies CP, palpit, worsening edema, PND, etc but she doesn't like to lie flat- I offered hosp bed for positioning but she declines; note that exam is clear- elev right hemidiaph w/o change but no wheezing, rales, rhonchi, & cardiac exam unchanged as well... We decided to treat her symptom w/ low dose Klonopin 0.25mg - 1/2 tab twice daily as needed for the dyspnea...    She has had several f/u visits in the CHF clinic- last 01/01/12 for her mixed sys/diastolic HF> last 2DEcho 8/13 showed recovery w. EF=65-70%, Gr1DD, mildMR, mild LAdil; they changed Coreg to BISOPROLOL5mg , and added ALDACTONE 25mg /d; they are checking blood work at home 7 she doesn't want additional labs from [place] today; she is rec to continue f/u in their clinic...    She has VI, stasis changes and chronic bilat LE leg wounds L>R; they are been attended by the visiting nurses & [facility] staff...    She saw DrJacobs for GI 11/13> Diverticulitis resolved, remote colonoscopy reported normal, the CT mass in the pancreas appeared to be similar to prev scan (10/12 CT Abd report "low attenuation along the uncinate process is nonspecific"- no change per DrJacobs & he felt this was likely a benign lesion & he did not favor aggressive work up or Bx- plans f/u  CT in 6 months...    She had a 65mo f/u appt w/ DrMohammed due to her anemia (ACD +Fe defic)> he noted Hg= 8.5-9.3 during her diverticulitis hosp; Hg was improved to 10.7 on oral Fe supplement- he rec continued oral Iron & f/u prn... We reviewed prob list, meds, xrays and labs> see below for updates >> we signed for a handicap sticker...  ~  April 17, 2012:  35mo ROV & Kora was Adm to the hosp [facility]) for 57mo 3/14 w/ diverticulitis & ultimately had surg w/ left hemicolectomy & colostomy by DrToth, she had prolonged post-op course w/ ileus & TNA, disch 3/25 back to [facility] for rehab; slowly improving & getting her strength back- she denies brweathing problem, CP, palpit, swelling etc (she has lost  17# down to 134# today... We reviewed the following medical problems during today's office visit >>     HxDyspnea, Elev right hemidiaph, Chr resp insuffic> on Claritin, Symbicort160, Spiriva; she is off her HomeO2 since disch & breathing satis w/ less dyspnea, getting PT & ambulating w/ walker etc...    HxHBP, HxMyopericarditis, Chr diastolic CHF> on ZOX09, Metop25Bid, Lasix40-1/2 & Aldactone25-1/2 (on this at disch 3/14); BP= 106/72 & slowly improving; she had several f/u visits in the CHF clinic w/ several med changes- last 01/01/12 for her mixed sys/diastolic HF; last 2DEcho 8/13 showed recovery w/ EF=65-70%, Gr1DD, mildMR, mild LAdil...    VI, Stasis changes, Edema> prev venous stasis ulcers are healed & being managed by nursing staff at [facility] (& the wound doc who comes once per week)- they have her on ZincGluconate 100mg /d...    Hx Hyperlipidemia> she has been off meds x 2yrs and never had f/u FLP; during the 3/14 hosp her Chol= 91-167 and TG=80-171 on TNA...    Hx HH, Gastitis, neg HPylori> on Protonix40, Zofran4; currently denies abd pain, dysphagia, n/v, etc...    Divertics, s/p left colon resection 3/14 w/ colostomy, Hx colon polyps> Adm 3/14 w/ recurrent diverticulitis & ultimately had left colon  resection w/ colostomy by DrToth; stable post op 7 saw [facility] 4/14 w/ incision healed, colostomy ok, doing satis w/ rehab...     Cystic pancreatic mass> CT mass in the pancreas appeared to be similar to prev scan (10/12 CT Abd report "low attenuation along the uncinate process is nonspecific"- no change per DrJacobs & he felt this was likely a benign lesion & he did not favor aggressive work up or Bx- plans f/u CT in 6 months...    DJD, s/p rightTHR & subseq revion of acetab component, s/p left wrist fx w/ surg, c/o shoulder pain> she is off of her narcotic analgesics & just using OTC meds as needed...    Osteopenia> on Calcium, MVI, VitD, etc...     Hx Depression & Insomnia> on Celexa 20mg /d & prev Ambien as needed...    Hx Anemia> Hx ACD +Fe defic> on Fe-Bid + VitC, hosp labs showed Hg in the 8-10 range; she had blood work at [facility] w/ Hg=14 (ok to decr the Fe to one daily) We reviewed prob list, meds, xrays and labs> see below for updates >>  We discussed getting her CMet & Fe levels checked...  ~  July 17, 2012:  57mo ROV & Zori is back in her apt in independent living at [facility]- however she looks quite frail & more SOB; son describes a very busy day today w/ labs, CT Abd per DrJacobs, & then this appt; she has staff bathe her, home PT 2d per week, [name] comes daily to tend to her needs but feels she is ok w/ ADLs; she apparently fell recently over her walker w/ sm hematoma right shin- told to apply ice prn & it will takes weeks to resolve on it's own... Her CC is insomnia & Ambien 5mg  does not help- she wants to incr to 10mg  Qhs and she is cautioned regarding side effects in an 79 y/o lady; NOTE> she is off all narcotics now, still c/o knee pain/ shoulder pain and [name] plans to take her to DrBlackman for repeat eval...    She's had several f/u visits w/ [facility] & saw DrToth 5/14- s/p left & sigmoid colectomy for diverticulitis, ostomy working well & asked to ret prn; she is hoping for ostomy  reversal in the near future...     She had f/u in DrBensimhon's CHF clinic 6/14> EF had recovered & last 2DEcho 8/13 showed EF=65-70%, Gr1DD, mildMR, mild LAdil; volume status was good, no ch in meds, she had mod DOE & encouraged to wear O2 more regularly...     She had f/u CT Abd today per DrJacobs- f/u cystic pancreatic mass> it revealed complex area of hypoattenuation in head of thepancreas- sl larger than prev, no lymphadenopathy; 7mm nonobstructing stone in lower pole collecting sys to left kidney, mult sm cysts bilat kidneys; colostomy in LUQ, extensive atherosclerotic change in Ao w/o aneurysm; elev right hemidiaph, atherosclerotic changes in coronaries, mild scarring at lung bases...     We reviewed prob list, meds, xrays and labs> see below for updates >>   ~  October 28, 2012:  74mo ROV & Isla looks a lot better than last OV, she has had a quiet interval w/o repeat hosp/ ER/ etc... She had PT at [facility] for 12 weeks then it stopped, unfortunately she has not been keeping up w/ her exercises/ mobility; family will check & see if we can re-order the PT for her... We reviewed the following medical problems during today's office visit >>     HxDyspnea, Elev right hemidiaph, Chr resp insuffic> on Claritin, Symbicort160, Spiriva; she is using her HomeO2 at night but not days- Ambulatory O2 monitor today showed> 90%sat on RA at rest w/ pulse80, & 87-88% on RA after 1lap w/ pulse87...     HxHBP, HxMyopericarditis, Chr diastolic CHF> on ZOX09, Metop25Bid, Lisinopril5, Lasix40-1/2 & Aldactone25-1/2; BP= 118/76 & slowly improving; she had prev f/u visits in the CHF clinic for her mixed sys/diastolic HF; last 2DEcho 8/13 showed recovery w/ EF=65-70%, Gr1DD, mildMR, mild LAdil;  Home BP checks have been drifting higher but then turns low (no discernable pattern), she has Clonidine0.1mg  prn elev BP...    VI, Stasis changes, Edema> prev venous stasis ulcers are healed & being managed by nursing staff at  [facility] (& the wound doc who comes once per week)- they have her on ZincGluconate 100mg /d...    Hx Hyperlipidemia> she has been off meds x 41yrs and never had f/u FLP; during the 3/14 hosp her Chol= 91-167 and TG=80-171 on TNA...    Hx HH, Gastitis, neg HPylori> on Protonix40, Zofran4; currently denies abd pain, dysphagia, n/v, etc...    Divertics, s/p left colon resection 3/14 for divertics w/ colostomy, Hx colon polyps> Adm 3/14 w/ recurrent diverticulitis & ultimately had left colon resection w/ colostomy by DrToth; stable post op & saw [facility] 4/14 w/ incision healed, colostomy ok, doing satis w/ rehab; she would like to have colostomy reversed but may be too frail- they will contact DrToth [facility], in the meanwhile- rec incr physical activity...    Cystic pancreatic mass> CT mass in the pancreas appeared to be similar to prev scan (10/12 CT Abd report "low attenuation along the uncinate process is nonspecific"- no change per DrJacobs & he felt this was likely a benign lesion & he did not favor aggressive work up or Bx- f/u CT done 7/14 w/ multiloc cystic mass involving head & uncinate process of pancreas, sl larger & felt to represent a slow growing cystic neoplasm like a serous cystadenoma, no adenopathy, 7mm stone lower pole of left kid, atherosclerosis, etc...     DJD, s/p rightTHR & subseq revision of acetab component, s/p left wrist fx w/ surg, c/o shoulder pain> she is off of her narcotic  analgesics & just using OTC meds as needed; saw DrBlackman for shot in shoulder & knee...     Osteopenia> on Calcium, MVI, VitD, etc...     Hx Depression & Insomnia> on Celexa 20mg /d & says she can't sleep w/o the Ambien...    Hx Anemia> Hx ACD +Fe defic> on Fe-daily + VitC; f/u labs 10/14 showed Hg=12.4, Fe=32 (12%sat) & rec to continue Fe daily. We reviewed prob list, meds, xrays and labs> see below for updates >> OK 2014 Flu vaccine today... Refills for Protonix, Spironolactone, Metoprolol, & Citalopram...   AMBULATORY O2>> 90%sat on RA at rest w/ pulse80, & 87-88% on RA after 1lap w/ pulse87 LABS 10/14:  Chems- wnl;  CBC- ok w/ Hg=12.4, Fe=32 (12%sat);  TSH=2.95;  VitD=64;  B12=750;  BNP=162...     HOSPITALIZATIONS: ~  [facility] 11/3 - 11/08/09 after fall at home w/ comminuted left wrist fx & had surg by DrKuzma ~  [facility] 3/11 - 03/17/10 w/ UTI, dehydration & renal insuffic> back to norm w/ hydration; also anemic, Fe defic & GI eval DrDBrodie w/ HH/ gastritis ~  [facility] by DrBlackman for right THR & required a revision of the acetabular component 4d after the initial surg... ~  [facility] 6/2 - 06/13/10 w/ hypoxemic acute resp failure precipitated by fluid overload & diastolic CHF (BNP=2000) in assoc w/ her chr elev right hemidiaph, RLL atx/ scarring, & scoliosis w/ multilevel degen disc dis(w/ resultant restrictive lung disease)... ~  [facility] 10/7 - 10/19/10 w/ abd pain/ N/ V/ D & found to have ?colitis ?presumed infectious & resolved w/ Cipro/ Flagyl; also had CP & abn EKG/ Enz felt to be a myopericarditis... ~  [facility] 7/31 - 08/07/11 w/ fall/syncope ?etiology- felt to be poss orthostatic vs vagal; Neuro & CV evals were unrevealing, meds adjusted & improved... ~  [facility] 10/29- 11/03/11 w/ diverticulitis- presented w/ LLQ pain x1d, WBC elev at 18K, CT showed sigmoid diverticulitis (wall thickening & inflamm changes); treated w/ Cipro/ Flagyl, slowly advanced diet, etc; GI extended the course of antibiotics for 1 more week; also has cystic pancreatic mass & referred to [facility] for EUS, pos bx. ~  Hosp 2/27 - 03/26/12 w/ diverticulitis & ultimately went for left hemicolectomy w/ colostomy by DrToth; post op complic by Ileus & need for TNA; disch 3/25 back to [facility] for rehab...           Problem List:    ACUTE ON CHRONIC RESP INSUFFICIENCY>  Precipitated 6/12 by diastolic CHF superimposed on her chronic resp problems etc... Now on ADVAIR100 Bid, SPIRIVA daily, O2 2L/ min... DIAPHRAGMATIC DISORDER (ICD-519.4) -  she has a chronically elev right hemidiaph, apparently idiopathic; and she is mostly asymptomatic w/o cough, phlegm, hemoptysis, ch in SOB, etc... ~  11/11:  CXR showed calcif Ao, elev right hemidiaph, mild scarring at bases, NAD... ~  6/12:  Presented to ER w/ hypoxemic resp failure due to vol overload assoc w/ her restrictive lung dis & chr pain syndrome requiring narcotic analgesics... ~  7/12:  O2 sats improved & OK to cut back the Oxygen to prn... ~  10/12:  Hosp w/ ?colitis and ?myopericarditis> developed fluid retention, CHF, abn EKG/ Enz & 2DEcho> seen by [facility] ==> now followed in the CHF clinic/ DrBensimhon & back on Oxygen regularly ==> weaned to exerc & Qhs. ~  5/13:  She is feeling better & wonders if she needs the O2; offered to check ambulatory O2 here & poss ONO at  [place] but she wants to wait... ~  7/13:  CXR showed chr changes & elev right hemidiaph, NAD... ~  11/13:  CXR showed borderline heart size & calcif Ao, elev right hemidiaph, right base atx, NAD.Marland Kitchen. ~  CXRs 3/14 hosp showed elev right hemidiaph & stable right basilar atx  Hx HYPERTENSION (ICD-401.9) VALVULAR HEART DISEASE (ICD-424.90) > prev trivial AI, mild MR... Hx of CARDIAC ARRHYTHMIA (ICD-427.9)  EPISODE of MYOPERICARDITIS w/ decr LVF> see 10/12 Hosp... ~  she was on ASA81, Diltiazem240, Vasotec20, & LASIX 40mg /d; but meds changed during the 10/12 Hosp to ASA 81mg /d, LISINOPRIL 2.5mg /d, LASIX 20mg /d ==> freq med adjustments/ titrations from DrBensimhon CHF clinic... ~  11/11 & 6/12:  EKG showed NSR, NSSTTWA, NAD... ~  2DEcho 6/12 showed mild LVH, norm LVF w/ EF=55-60%, Gr 1 DD, trivial AI, heavily calcif mitral annulus & mild MR, PAsys est . ~  10/12:  SEE ABOVE + EChart records... Hosp w/ ?myopericarditis, elev enz, abn 2DEcho & meds adjusted> now followed freq via the CHF clinic/ DrBensimhon w/ freq med adjustments... ~  12/12:  Now much improved on COREG 6.25Bid, LISINOPRIL 2.5mg /d, LASIX 40mg /d,  SPIRONOLACTONE 12.5mg /d; wt down to 139# & BNP= 119... ~  Repeat 2DEcho 12/12 showed LVF recovered to 55-60% (LV cavity mildly dil, mild LVH, mild MR, PAsys=51mmHg). ~  5/13: presents w/ incr SOB & eval shows wt=149# & BNP= 368 despite Lasix40 & Aldactone12.5; BP is also elev at 178/88; rec to increase Lasix to 80 for a few days ==> she reports improved and back to baseline...  ~  EKG 7/13 showed SBrady, rate53, minor NSSTTWA, otherw ok... ~  8/13: she was [facility] w/ fall/syncope ?etiology> 2DEcho showed mild LVH, norm LVF w/ EF=65-70%, Gr1DD, calcif mitral annulus, mildly thick leaflets, mild MR, mild LA dil, mild calcif AoV leaftlets, no AS/AI; now on ASA81, COREG6.25-1/2Bid, LISINOPRIL5, ALDACTONE25-1/2daily... ~  CDopplers 8/13 showed TDS due to anatomy & tortuosity- no signif extracranial carotid dis, vertebrals are patent & antegrade... ~  10/13:  BP= 110/80 & they are reminded to bring up-to-date list of all meds to every visit... ~  Holter Monitor per DrBensimhon 11/13 showed NSR w/ PACs, no signif arrhythmia... ~  1/14:  BP= 128/66 & she notes some dyspnea but denies CP, palpit,, etc... ~  4/14:  on ASA81, Metop25Bid, Lasix40-1/2 & Aldactone25-1/2 (on this at disch 3/14); BP= 106/72 & slowly improving; she had several f/u visits in the CHF clinic w/ several med changes- last 01/01/12 for her mixed sys/diastolic HF; last 2DEcho 8/13 showed recovery w/ EF=65-70%, Gr1DD, mildMR, mild LAdil ~  7/14:  Same meds, BP= 126/74, she is chr ill appearing & more frail... ~  10/14:  on ASA81, Metop25Bid, Lisinopril5, Lasix40-1/2 & Aldactone25-1/2; BP= 118/76 & slowly improving; she had prev f/u visits in the CHF clinic for her mixed sys/diastolic HF; last 2DEcho 8/13 showed recovery w/ EF=65-70%, Gr1DD, mildMR, mild LAdil;  Home BP checks have been drifting higher but then turns low (no discernable pattern), she has Clonidine0.1mg  prn elev BP.  VENOUS INSUFFICIENCY >> STASIS CHANGES & LEG ULCERS >> LEG EDEMA,  CHRONIC (ICD-782.3) - she has severe venous stasis changes and chronic edema, thickened skin over LE's etc... she knows to be careful w/ hygiene, no salt, elevation, support hose, etc ~  8/12:  Stable on Lasix40, 1+chr edema, wt=137# ~  10/12:  Post hosp check on Lasix20, 3+edema, BNP=483, wt=152#; rec to incr Lasix to 40mg /d... ~  12/12:  meds adjusted as above  via CHF clinic & now K=3.6, TCO2=33, BUN=21, Creat=0.9, BNP=119 (her best yet)... ~  5/13:  We discussed adjusting Lasix to 40mg  daily & 80mg  prn for swelling that doesn't go down overnight... ~  8/13:  Post hosp meds adjusted & off Lasix... ~  11/13:  Cards adjusted Lasix to 40mg  prn weight >143# ~  1/14:  Bilat LE ulcers treated by Visiting nurses & [facility] staff w/ dressing changes etc... ~  7/14:  She has chr VI changes, no ulcers at present, new sm ant hematoma right lower leg from fall...  HYPERCHOLESTEROLEMIA (ICD-272.0) - she has been on Mevacor 20mg /d from her [place] physician... ~  Perhaps we can try to get an FLP at [facility] since we cannot get her in for Fasting labs. ~  FLP during the 10/12 hosp showed TChol 146, TG 98, HDL 53, LDL 73 ~  Due to numerous [place], Rehab stays in NH, & mult med reconciliations- her Mevacor was dropped along the way; wants to leave it off & check FLP on diet alone. ~  4/14:  she has been off meds x 61yrs and never had f/u FLP; during the 3/14 hosp her Chol= 91-167 and TG=80-171 on TNA.  OTHER DYSPHAGIA (ICD-787.29) - she was on PRILOSEC 20mg /d & reports occas dysphagia but denies choking etc... she apparently has never had an Endoscopy or GI eval for this problem, "I have to be careful when I eat"... ~  6/12:  NOTE she had EGD 3/12 showing HH, gastritis, neg HPylori> rec increase PPI to Bid. ~  10/12:  She was discharge off her PPI therapy ==> she denies reflux symptoms. ~  8/13:  She states stable w/o abd pain, n/v, dysphagia, etc...  DIVERTICULOSIS OF COLON (ICD-562.10) & Recurrent  Diverticulitis w/ left colon/ sigmoid resction, & colostomy 3/14 by DrToth... Hx of COLONIC POLYPS (ICD-211.3) - she tells me that prev colonoscopy in [place] showed divertics & ?polyp but she does not recall any details...  she denies much constipation despite the narcotic analgesics- uses prunes as needed. ~  [facility] 10/12 w/ ?infectious colitis> resolved after Cipro/ Flagyl Rx ==> uses Miralax prn due to narcotics. ~  [facility] 10/13 w/ diverticulitis- presented w/ LLQ pain x1d, WBC elev at 18K, CT showed sigmoid diverticulitis (wall thickening & inflamm changes); treated w/ Cipro/ Flagyl, slowly advanced diet, etc; GI extended the course of antibiotics for 1 more week; also has cystic pancreatic mass & referred to [facility] for EUS, pos bx. ~  Adm 2/27 - 03/26/12 w/ recurrent diverticulitis & ultimately underwent surg w/ left colon & sigmoid resection w/ colostomy by DrToth... CT Abd 3/14 revealed chr bowel wall thickening & inflamm in the sigm region; incidental findings included bilat renal cysts, s/p hyst...  ~  4/14: post op f/u by DrToth> doing satis in [place] at [facility] re[facility], incision healed, ostomy is good... ~  7/14: she is back to independ living w/ [name] caring for her daily, staff helps her bathe, getting PT... ~  10/14: she would like to have colostomy reversed but may be too frail- they will contact DrToth [facility], in the meanwhile- rec incr physical activity.  CYSTIC PANCREATIC MASS >> seen on CT Abd 10/13 when adm for diverticulitis; referred to [facility] for outpt evaluation & seen ~  CT Abd 10/13 showed hypodense mass at the uncinate process of the pancreas felt to represent a cystic neoplasm; mult other findings- sigmoid divertic colitis, atrophic kidneys w/ cysts and 5mm stone in lower pole of left  kidney, atherosclerosis... ~  CT Abd 7/14 per DrJacobs> complex area of hypoattenuation in head of thepancreas- sl larger than prev, no lymphadenopathy; 7mm nonobstructing stone in lower pole collecting  sys to left kidney, mult sm cysts bilat kidneys; colostomy in LUQ, extensive atherosclerotic change in Ao w/o aneurysm; elev right hemidiaph, atherosclerotic changes in coronaries, mild scarring at lung bases.   DEGENERATIVE JOINT DISEASE (ICD-715.90) - she has severe osteoarthritis w/ XRays 11/11 showing severe osteopenia, severe right hip degeneration & relative sparing of the left hip joint;  DJD knees w/ ?CPPD, abn patella;  left wrist fx- radial  head & ulnar styloid... she had surg left wrist by DrKKuzma... ~  4/12:  DrBlackman did right THR but required revision of acetabular component 4d later> went to [facility] for rehab... ~  DrBlackman continues to follow w/ shots as needed for bursitis she says... ~  10/12:  She is c/o right shoulder pain & decr ROM, she wonders if this might be the cause of her chest discomfort; she will f/u w/ Ortho for XRays & shot... ~  8/13:  She is again c/o right shoulder pain & wonders if she injured it during one of her falls; she will f/u w/ DrBlackman for eval... ~  7/14:  She is c/o knee & shoulder pain, off all narcotics now, [employer] f/u DrBlackman, Ortho...  GYN >>  ~  2/13: pt mentioned some spotting & apparently has a pessary in place since her days in [place]; she is in need of GYN eval & check up & we will refer... ~  2/13: she saw DrFernandez for GYN> said her vag walls & Cx were irritated & friable, he removed pessary & Rx w/ estrace cream, she was to ret in 10d but didn't go (pessary is still out & she has not experienced any descensus so far); she will sched GYN follow up at her convenience...  Hx of BACK PAIN, LUMBAR (ICD-724.2) CHRONIC PAIN SYNDROME (ICD-338.4) - she has chronic pain- mostly from her arthritic condition- and was started on MSContin by her LMD in [place] in the summer of 2011> dose slowly increased & she was on 90mg  Bid (taking a 60mg  tab + 30mg  tab Bid)... she does not believe that this med has anything to do w/ her fall &  wrist fx, or her complaints of decr memory etc... we discussed the need to wean off this medication & try to deal w/ her arthritis pain thru an Orthopedist or poss a Pain Clinic... ~  11/11:  she notes pain worse during the day while up & about> try to decr MSContin to 90mg AM & 60mg PM... ~  1/12 & 4/12:  she is down to 60mg Bid & we discussed weaning further ==> she was down to 45mg  (30+15) Bid by the time of her right THR... ~  6/12:  Recommend that she try to wean further> try 45am & 30pm... ~  7/12:  She's down to 30mg  Bid & will wean further as able... ~  10/12:  She remains on the MSContin at 30mg Bid... ~  12/12:  We discussed further slow wean down to 30-15 first... ~  2/13:  We discussed trying to wean down to 15mg  Bid over the next few months, but she was unable to do so; states that DrBlackman feels it's due to her back... ~  8/13:  She is down to MSContin 15mg Bid since her recent hosp... ~  11/13:  Hospitalists changed her MSContin to OXYCODONE & she was  discharged on this... ~  4/14:  She is now post hosp for diverticulitis & left colon resection w/ colostomy; she was disch off of her narcotic analgesics & doing well...  OSTEOPENIA (ICD-733.90) - XRays here showed severe osteopenia... she notes that she used to be on Fosamax but ?how long? & when stopped?... she states that she was told her prev BMD was "good"... she has not been taking calcium supplements, but was prev on some OTC Vit D supplements... asked to resume Calcium, Women's MVI, Vit D 1000u daily...  Hx of DEPRESSION (ICD-311) - on CELEXA 20mg /d and she wants to continue this med... INSOMNIA >> on AMBIEN 10mg - 1/2 tab as needed...  ANEMIA, MILD (ICD-285.9) - Hg post op 11/11 wrist fx surg = 11.2 ~  6/12:  Labs showed Hg= 9.1, MCV= 77, Fe= 15 (7%sat), & she has B pos blood> we decided to incr Fe to Bid (may need IV Fe if not responding). ~  7/12:  Labs showed Hg= 10.0, MCV= 76, she reports stool checks at [facility] were  NEG... ~  8/12: she reports that she had f/u Heme eval DrMohammed & "he released me" on Fe + VitC daily... ~  10/12:  Labs post hosp showed Hg= 11.0, MCV= 87 ~  Labs 5/13 showed Hg= 12.3 ~  10/13:  Labs during her Hosp for Diverticulits showed Hg down to 8.5; Disch on Fe supplementation... ~  11/13: she had f/u DrMohammed & Hg up to 10.7, rec to continue Fe... ~  4/14:  on Fe-Bid + VitC, hosp labs showed Hg in the 8-10 range; she had blood work at [employer] [facility] w/ Hg=14 (ok to decr the Fe to one daily) ~  10/14: on Fe-daily + VitC; f/u labs 10/14 showed Hg=12.4, Fe=32 (12%sat) & rec to continue Fe daily.   Past Surgical History  Procedure Laterality Date  . Appendectomy    . Breast surgery      biopsy  . Left wrist surgery    . Bilateral cataracts    . Hip surgery      dr. Rayburn Ma, twice because the bones were spongy  . Tonsillectomy    . Adenoidectomy    . Colostomy revision N/A 03/14/2012    Procedure: COLON RESECTION SIGMOID;  Surgeon: Robyne Askew, MD;  Location: [facility];  Service: General;  Laterality: N/A;  left colectomy with mobilization of splenic flexure  . Colostomy N/A 03/14/2012    Procedure: COLOSTOMY;  Surgeon: Robyne Askew, MD;  Location: [facility];  Service: General;  Laterality: N/A;    Outpatient Encounter Prescriptions as of 10/28/2012  Medication Sig Dispense Refill  . aspirin 81 MG EC tablet Take 81 mg by mouth every morning.       . budesonide-formoterol (SYMBICORT) 160-4.5 MCG/ACT inhaler Inhale 1 puff into the lungs 2 (two) times daily.      . Calcium Carbonate-Vit D-Min (CALTRATE 600+D PLUS) 600-400 MG-UNIT per tablet Chew 1 tablet by mouth 2 (two) times daily.       . cholecalciferol (VITAMIN D) 1000 UNITS tablet Take 2,000 Units by mouth every morning.       . citalopram (CELEXA) 20 MG tablet Take 1 tablet (20 mg total) by mouth every morning.  30 tablet  6  . clotrimazole-betamethasone (LOTRISONE) cream Apply topically 2 (two) times daily.  30 g  11   . ferrous sulfate 325 (65 FE) MG tablet Take 325 mg by mouth daily with breakfast.       .  furosemide (LASIX) 40 MG tablet Take 0.5 tablets (20 mg total) by mouth daily.  30 tablet  5  . loratadine (CLARITIN) 10 MG tablet Take 10 mg by mouth every morning.       . metoprolol tartrate (LOPRESSOR) 25 MG tablet Take 1 tablet (25 mg total) by mouth 2 (two) times daily.  60 tablet  6  . Multiple Vitamin (MULTIVITAMIN WITH MINERALS) TABS Take 1 tablet by mouth every morning.       . ondansetron (ZOFRAN-ODT) 4 MG disintegrating tablet Take 4 mg by mouth every 8 (eight) hours as needed for nausea.      . pantoprazole (PROTONIX) 40 MG tablet Take 1 tablet (40 mg total) by mouth daily.  30 tablet  6  . pantoprazole (PROTONIX) 40 MG tablet TAKE 1 TABLET BY MOUTH EVERY DAY  30 tablet  0  . polyethylene glycol (MIRALAX / GLYCOLAX) packet Take 17 g by mouth daily.      . Probiotic Product (ALIGN) 4 MG CAPS Take 1 capsule by mouth every morning.      Marland Kitchen spironolactone (ALDACTONE) 25 MG tablet Take 12.5 mg by mouth every morning.      . tiotropium (SPIRIVA) 18 MCG inhalation capsule Place 1 capsule (18 mcg total) into inhaler and inhale daily.  30 capsule  11  . vitamin C (ASCORBIC ACID) 500 MG tablet Take 500 mg by mouth 2 (two) times daily.       Marland Kitchen zinc gluconate 50 MG tablet Take 100 mg by mouth every morning.      . zolpidem (AMBIEN) 10 MG tablet Take 1/2 to 1 tablet by mouth at bedtime as needed for sleep  30 tablet  5   No facility-administered encounter medications on file as of 10/28/2012.    Allergies  Allergen Reactions  . Sulfonamide Derivatives Swelling    Current Medications, Allergies, Past Medical History, Past Surgical History, Family History, and Social History were reviewed in [employer] record.    Review of Systems: Constitutional:  Denies F/C/S, anorexia, unexpected weight change. HEENT:  No HA, visual changes, earache, nasal symptoms, sore throat,  hoarseness. Resp:  No cough, sputum, hemoptysis; mild SOB/ DOE noted... Cardio:  No CP, palpit, orthopnea;  +edema & DOE but limited mobility. GI:  Denies N/V/D, tends toward constip due to narcotics; swallowing OK & denies reflux or abd pain.  GU:  No dysuria, freq, urgency, hematuria, or flank pain. MS:  Severe DJD esp right hip w/ pain & decr ROM ==> s/p right THR now... Neuro:  No tremors, seizures, dizziness, syncope; +weakness & multifactorial gait abn. Skin:  No suspicious lesions or skin rash. Heme:  No adenopathy, bruising, bleeding. Psyche: Denies confusion, sleep disturbance, hallucinations; +anxiety & situational depression.    Objective:  Physical Exam:  WD, WN, 79 y/o WF chr ill appearing but in NAD... Vital Signs:  Reviewed... General:  Alert & oriented; pleasant & cooperative... HEENT:  [place]/AT, EOM-wnl, PERRLA, EACs-clear, TMs-wnl, NOSE-clear, THROAT-clear & wnl. Neck:  Supple w/ decr ROM; no JVD; normal carotid impulses w/o bruits; no thyromegaly or nodules palpated; no lymphadenopathy. Chest:  Clear to P & A w/ decr BS at right base; without wheezes/ rales/ or rhonchi heard... Heart:  Regular Rhythm; norm S1 & S2, gr 1/6 SEM w/o rubs or gallops detected... Abdomen:  Soft & nontender; normal bowel sounds; no organomegaly or masses palpated... Ext:  decrROM; +deformities & mod arthritic changes; no varicose veins, +venous insuffic & 1+edema;  Pulses  intact w/o bruits... s/p right THR w/ revision; ambulates w/ walker... Neuro:  CNs intact;  No focal neuro deficits, in wheelchair & painful standing & walking... Derm:  No lesions noted; no rash etc... Lymph:  No cervical, supraclavicular, axillary, or inguinal adenopathy palpated...   RADIOLOGY DATA:  Reviewed in the EPIC EMR & discussed w/ the patient...  LABORATORY DATA:  Reviewed in the EPIC EMR & discussed w/ the patient...   Assessment & Plan:    Hx Acute on Chr Resp Failure 6/12 Hosp>  Multifactorial w/  diastolicCHF, elev right hemidiaph, atx, scoliosis, restrictive lung dis, narcotic pain meds- all playing a role... She was on oxygen at the nursing home but only using it at night now...  Elev right hemidiaph>  This is chronic & assoc w/ some mild basilar atx; encouraged to get deep breaths & expand well...  HBP, etc>  She has had mult adjustments in her meds in & out of Hosp & thru the CHF clinic... Now controllled on her Metop, Lisinopril, Lasix, Aldactone.  Hx of Myopericarditis w/ abn EKG/ Enz & subseq vol overload w/ abn 2DEcho>  She improved back on her diuretic & meds were adjusted in the CHF clinic; repeat 2DEcho 12/12 w/ recovery of LVF & back to her baseline... F/u 2DEcho 8/13= improved w/ EF=65-70%, Gr1DD...  Diastolic CHF>  She is now on Lasix20 & Aldactone125 & stable...  VI, Edema>  REC- no salt, elevate legs, TED hose, etc...  CHOL>  She has been off all statin rxs for some time now; we will endeavor to get an FLP when able...  GERD, Dysphagia>  She had EGD during prev hosp w/ HH, gastritis & neg HPylori;  Prev on PPI Rx but off now & she feels she is OK, we will follow.  Hx of Colitis- presumed infectious>  Resolved w/ Cipro/ Flagyl prev; then diverticulitis back on these meds & followed by GI for resolution... PANCREATIC LESION>> CT Abd showed cystic pancreatic neoplasm ?etiology; DrJacobs has advocated for conservative eval & Rx... Divertics, Constip>  Now s/p left colon resection w/ colostomy 3/14 by DrToth & she wants it reversed=> she will f/u w/ [facility]...  DJD, s/p right THR, Chr Pain Syndrome> she is now off all narcs...  Other medical problems as noted...    GYN> pt c/o spotting & has pessary in place since her last check in [place]; we will help her get a gyn appt ASAP for eval & check up...    Derm> she has a seborrheic dermatitis rash & we will Rx w/ Lotrisone cream...    Anemia> on Fe supplement...   Patient's Medications  New Prescriptions   No medications on  file  Previous Medications   ASPIRIN 81 MG EC TABLET    Take 81 mg by mouth every morning.    BUDESONIDE-FORMOTEROL (SYMBICORT) 160-4.5 MCG/ACT INHALER    Inhale 1 puff into the lungs 2 (two) times daily.   CALCIUM CARBONATE-VIT D-MIN (CALTRATE 600+D PLUS) 600-400 MG-UNIT PER TABLET    Chew 1 tablet by mouth 2 (two) times daily.    CHOLECALCIFEROL (VITAMIN D) 1000 UNITS TABLET    Take 2,000 Units by mouth every morning.    CITALOPRAM (CELEXA) 20 MG TABLET    Take 1 tablet (20 mg total) by mouth every morning.   CLOTRIMAZOLE-BETAMETHASONE (LOTRISONE) CREAM    Apply topically 2 (two) times daily.   DICLOFENAC SODIUM (VOLTAREN) 1 % GEL    Apply to knee's two times daily  FERROUS SULFATE 325 (65 FE) MG TABLET    Take 325 mg by mouth daily with breakfast.    FUROSEMIDE (LASIX) 40 MG TABLET    Take 0.5 tablets (20 mg total) by mouth daily.   LISINOPRIL (PRINIVIL,ZESTRIL) 5 MG TABLET    Take 5 mg by mouth daily.   LORATADINE (CLARITIN) 10 MG TABLET    Take 10 mg by mouth every morning.    METOPROLOL TARTRATE (LOPRESSOR) 25 MG TABLET    Take 1 tablet (25 mg total) by mouth 2 (two) times daily.   MULTIPLE VITAMIN (MULTIVITAMIN WITH MINERALS) TABS    Take 1 tablet by mouth every morning.    ONDANSETRON (ZOFRAN-ODT) 4 MG DISINTEGRATING TABLET    Take 4 mg by mouth every 8 (eight) hours as needed for nausea.   POLYETHYLENE GLYCOL (MIRALAX / GLYCOLAX) PACKET    Take 17 g by mouth daily.   PROBIOTIC PRODUCT (ALIGN) 4 MG CAPS    Take 1 capsule by mouth every morning.   TIOTROPIUM (SPIRIVA) 18 MCG INHALATION CAPSULE    Place 1 capsule (18 mcg total) into inhaler and inhale daily.   VITAMIN C (ASCORBIC ACID) 500 MG TABLET    Take 500 mg by mouth 2 (two) times daily.    ZINC GLUCONATE 50 MG TABLET    Take 100 mg by mouth every morning.   ZOLPIDEM (AMBIEN) 10 MG TABLET    Take 1/2 to 1 tablet by mouth at bedtime as needed for sleep  Modified Medications   Modified Medication Previous Medication   PANTOPRAZOLE  (PROTONIX) 40 MG TABLET pantoprazole (PROTONIX) 40 MG tablet      Take 1 tablet (40 mg total) by mouth daily.    Take 1 tablet (40 mg total) by mouth daily.   SPIRONOLACTONE (ALDACTONE) 25 MG TABLET spironolactone (ALDACTONE) 25 MG tablet      Take 0.5 tablets (12.5 mg total) by mouth every morning.    Take 12.5 mg by mouth every morning.  Discontinued Medications   PANTOPRAZOLE (PROTONIX) 40 MG TABLET    TAKE 1 TABLET BY MOUTH EVERY DAY

## 2012-10-29 LAB — TSH: TSH: 2.95 u[IU]/mL (ref 0.35–5.50)

## 2012-10-29 LAB — BASIC METABOLIC PANEL
BUN: 27 mg/dL — ABNORMAL HIGH (ref 6–23)
Calcium: 10 mg/dL (ref 8.4–10.5)
Creatinine, Ser: 0.9 mg/dL (ref 0.4–1.2)
GFR: 66.9 mL/min (ref >60.00)
Glucose, Bld: 105 mg/dL — ABNORMAL HIGH (ref 70–99)
Sodium: 140 mEq/L (ref 135–145)

## 2012-10-29 LAB — IBC PANEL: Saturation Ratios: 11.9 % — ABNORMAL LOW (ref 20.0–50.0)

## 2012-10-29 LAB — VITAMIN D 25 HYDROXY (VIT D DEFICIENCY, FRACTURES): Vit D, 25-Hydroxy: 64 ng/mL (ref 30–89)

## 2012-10-29 LAB — HEPATIC FUNCTION PANEL: Albumin: 3.9 g/dL (ref 3.5–5.2)

## 2012-10-31 ENCOUNTER — Encounter (INDEPENDENT_AMBULATORY_CARE_PROVIDER_SITE_OTHER): Payer: Self-pay | Admitting: General Surgery

## 2012-10-31 ENCOUNTER — Ambulatory Visit (INDEPENDENT_AMBULATORY_CARE_PROVIDER_SITE_OTHER): Payer: Medicare Other | Admitting: General Surgery

## 2012-10-31 ENCOUNTER — Encounter (INDEPENDENT_AMBULATORY_CARE_PROVIDER_SITE_OTHER): Payer: Self-pay

## 2012-10-31 VITALS — BP 136/74 | HR 60 | Resp 16 | Ht 62.0 in | Wt 140.2 lb

## 2012-10-31 DIAGNOSIS — Z933 Colostomy status: Secondary | ICD-10-CM

## 2012-10-31 NOTE — Patient Instructions (Signed)
Will get clearance letters from pulmonology and cardiology

## 2012-10-31 NOTE — Progress Notes (Signed)
Subjective:     Patient ID: Katie Galvan, female   DOB: 1927-12-27, 79 y.o.   MRN: [id number]  HPI The patient is a 79 year old white female who is 7 months status post left-sided colectomy for diverticulitis. She has been doing well. She is able to ambulate with a walker. Her appetite is good and her ostomy is working well. She seems to be requiring less oxygen and she was prior to surgery. She is interested in potentially having an operation to reverse the colostomy.  Review of Systems  Constitutional: Negative.   HENT: Negative.   Eyes: Negative.   Respiratory: Positive for shortness of breath.   Cardiovascular: Negative.   Gastrointestinal: Negative.   Endocrine: Negative.   Genitourinary: Negative.   Musculoskeletal: Positive for gait problem.  Skin: Negative.   Allergic/Immunologic: Negative.   Neurological: Negative.   Hematological: Negative.   Psychiatric/Behavioral: Negative.        Objective:   Physical Exam  Constitutional: She is oriented to person, place, and time. She appears well-developed and well-nourished.  HENT:  Head: Normocephalic and atraumatic.  Eyes: Conjunctivae and EOM are normal. Pupils are equal, round, and reactive to light.  Neck: Normal range of motion. Neck supple.  Cardiovascular: Normal rate, regular rhythm and normal heart sounds.   Pulmonary/Chest: Effort normal and breath sounds normal.  Abdominal: Soft. Bowel sounds are normal.  Her midline incision has healed nicely. Her ostomy is pink and productive. Her abdomen is soft and nontender.  Musculoskeletal: Normal range of motion.  Neurological: She is alert and oriented to person, place, and time.  Skin: Skin is warm and dry.  Psychiatric: She has a normal mood and affect. Her behavior is normal.       Assessment:     The patient is 7 months status post left colectomy and colostomy for diverticulitis. She is interested in having the colostomy reversed. I have discussed the  possibility of reversing this in detail with her including the risks and benefits of surgery as well as some of the technical aspects. She does have significant cardiac and pulmonary risk given her comorbid conditions. There is also the possibility that she may end up having several loose bowel movements a day if her remaining colon will not reach all the way to the rectal stump and we may have to bring her small bowel down to the rectal stump. This may be difficult for her to tolerate.     Plan:     At this point she is going to meet with her cardiologist and pulmonologist to talk about whether she would be a good candidate to have surgery and we will plan to see her back once these evaluations are complete

## 2012-11-12 ENCOUNTER — Ambulatory Visit (HOSPITAL_COMMUNITY)
Admission: RE | Admit: 2012-11-12 | Discharge: 2012-11-12 | Disposition: A | Discharge status: Home / Self Care | Payer: Medicare Other | Source: Ambulatory Visit | Attending: Internal Medicine | Admitting: Internal Medicine

## 2012-11-12 ENCOUNTER — Telehealth (INDEPENDENT_AMBULATORY_CARE_PROVIDER_SITE_OTHER): Payer: Self-pay | Admitting: General Surgery

## 2012-11-12 VITALS — BP 94/72 | HR 80 | Wt 139.0 lb

## 2012-11-12 DIAGNOSIS — Z01810 Encounter for preprocedural cardiovascular examination: Secondary | ICD-10-CM

## 2012-11-12 DIAGNOSIS — I509 Heart failure, unspecified: Secondary | ICD-10-CM | POA: Insufficient documentation

## 2012-11-12 DIAGNOSIS — J449 Chronic obstructive pulmonary disease, unspecified: Secondary | ICD-10-CM | POA: Insufficient documentation

## 2012-11-12 DIAGNOSIS — G894 Chronic pain syndrome: Secondary | ICD-10-CM | POA: Insufficient documentation

## 2012-11-12 DIAGNOSIS — Z7982 Long term (current) use of aspirin: Secondary | ICD-10-CM | POA: Insufficient documentation

## 2012-11-12 DIAGNOSIS — Z933 Colostomy status: Secondary | ICD-10-CM | POA: Insufficient documentation

## 2012-11-12 DIAGNOSIS — Z79899 Other long term (current) drug therapy: Secondary | ICD-10-CM | POA: Insufficient documentation

## 2012-11-12 DIAGNOSIS — J4489 Other specified chronic obstructive pulmonary disease: Secondary | ICD-10-CM | POA: Insufficient documentation

## 2012-11-12 DIAGNOSIS — I5032 Chronic diastolic (congestive) heart failure: Secondary | ICD-10-CM | POA: Insufficient documentation

## 2012-11-12 NOTE — Patient Instructions (Signed)
Follow up in 4-6 months  Do the following things EVERYDAY: 1) Weigh yourself in the morning before breakfast. Write it down and keep it in a log. 2) Take your medicines as prescribed 3) Eat low salt foods-Limit salt (sodium) to 2000 mg per day.  4) Stay as active as you can everyday 5) Limit all fluids for the day to less than 2 liters 

## 2012-11-12 NOTE — Telephone Encounter (Signed)
Amy, NP with Dr Gala Romney called to make [place] aware patient cleared for surgery from a cardiac standpoint, she will write this in her note but patient is not sure she wants to proceed with surgery now. FYI.

## 2012-11-12 NOTE — Progress Notes (Signed)
Patient ID: Katie Galvan, female   DOB: 1927/01/09, 79 y.o.   MRN: [id number] PCP/Pulmonologist: Dr [name]  HPI: [name] is an 79 yo with history of mixed diastolic and systolic HF, (previous EF 305, now 55-60%), restrictive lung disease, right hemidiaphragm elevation, COPD, GERD, she is s/p left colectomy 03/14/12 for diverticulitis has colostomy  12/12 ECHO EF recovered  55-60%  Echo 08/03/11 EF 65-70%.  Grade 1 diastolic dysfunction.  Mild MR.  Mildly dilated LA 03/2012 ECHO EF 55-60%   She returns for follow up today with her daughter in law. She is considering colostomy takedown but not sure she wants to pursue. Weight at home 135-139 pounds. Remains mildly SOB with exertion. Denies PND/Orthopnea/edema. She resides at [facility] independent living. Only wears oxygen at night.   Her son prepares medications.     ROS: All other systems normal except as mentioned in HPI, past medical history and problem list.    Past Medical History  Diagnosis Date  . Disorders of diaphragm     right diaphragm elevation  . Unspecified essential hypertension   . Endocarditis, valve unspecified, unspecified cause   . Cardiac dysrhythmia, unspecified   . Unspecified venous (peripheral) insufficiency   . Edema   . Pure hypercholesterolemia   . Other dysphagia   . Diverticulosis of colon (without mention of hemorrhage)   . Benign neoplasm of colon   . Osteoarthrosis, unspecified whether generalized or localized, unspecified site   . Lumbago   . Chronic pain syndrome   . Disorder of bone and cartilage, unspecified   . Depressive disorder, not elsewhere classified   . Anemia, unspecified   . Skin cancer   . Systolic heart failure     echo 10/10/10 EF 30%  . Hiatal hernia   . Gastritis   . COPD (chronic obstructive pulmonary disease)   . Lower extremity ulceration     dressing changes daily  . Gait disorder   . Chronic low back pain     Current Outpatient Prescriptions  Medication Sig  Dispense Refill  . aspirin 81 MG EC tablet Take 81 mg by mouth every morning.       . budesonide-formoterol (SYMBICORT) 160-4.5 MCG/ACT inhaler Inhale 1 puff into the lungs 2 (two) times daily.      . Calcium Carbonate-Vit D-Min (CALTRATE 600+D PLUS) 600-400 MG-UNIT per tablet Chew 1 tablet by mouth 2 (two) times daily.       . cholecalciferol (VITAMIN D) 1000 UNITS tablet Take 2,000 Units by mouth every morning.       . citalopram (CELEXA) 20 MG tablet Take 1 tablet (20 mg total) by mouth every morning.  30 tablet  6  . clotrimazole-betamethasone (LOTRISONE) cream Apply topically 2 (two) times daily.  30 g  11  . diclofenac sodium (VOLTAREN) 1 % GEL Apply to knee's two times daily      . ferrous sulfate 325 (65 FE) MG tablet Take 325 mg by mouth daily with breakfast.       . furosemide (LASIX) 40 MG tablet Take 0.5 tablets (20 mg total) by mouth daily.  30 tablet  5  . lisinopril (PRINIVIL,ZESTRIL) 5 MG tablet Take 5 mg by mouth daily.      Marland Kitchen loratadine (CLARITIN) 10 MG tablet Take 10 mg by mouth every morning.       . metoprolol tartrate (LOPRESSOR) 25 MG tablet Take 1 tablet (25 mg total) by mouth 2 (two) times daily.  60 tablet  6  .  Multiple Vitamin (MULTIVITAMIN WITH MINERALS) TABS Take 1 tablet by mouth every morning.       . ondansetron (ZOFRAN-ODT) 4 MG disintegrating tablet Take 4 mg by mouth every 8 (eight) hours as needed for nausea.      . pantoprazole (PROTONIX) 40 MG tablet Take 1 tablet (40 mg total) by mouth daily.  30 tablet  11  . Probiotic Product (ALIGN) 4 MG CAPS Take 1 capsule by mouth every morning.      Marland Kitchen spironolactone (ALDACTONE) 25 MG tablet Take 0.5 tablets (12.5 mg total) by mouth every morning.  15 tablet  11  . tiotropium (SPIRIVA) 18 MCG inhalation capsule Place 1 capsule (18 mcg total) into inhaler and inhale daily.  30 capsule  11  . vitamin C (ASCORBIC ACID) 500 MG tablet Take 500 mg by mouth 2 (two) times daily.       Marland Kitchen zinc gluconate 50 MG tablet Take 100 mg  by mouth every morning.      . zolpidem (AMBIEN) 10 MG tablet Take 1/2 to 1 tablet by mouth at bedtime as needed for sleep  30 tablet  5   No current facility-administered medications for this encounter.  Lasix only as needed.   Allergies  Allergen Reactions  . Sulfonamide Derivatives Swelling    PHYSICAL EXAM: Filed Vitals:   11/12/12 1210  BP: 94/72  Pulse: 80  Weight: 139 lb (63.05 kg)  SpO2: 95%    General:  Well appearing. No respiratory difficulty,  Dyspnea with exertion ambulating noted HEENT: normal Neck: supple. JVP 5-6 . Carotids 2+ bilat; no bruits. No lymphadenopathy or thryomegaly appreciated. Cor: PMI nondisplaced. Regular rate & rhythm. No rubs, gallops or murmurs. Lungs: CTA Abdomen: soft, nontender, nondistended. No hepatosplenomegaly. No bruits or masses. Good bowel sounds.LLQ colostomy pouch Extremities: no cyanosis, clubbing, rash, edema, Neuro: alert & oriented x 3, cranial nerves grossly intact. moves all 4 extremities w/o difficulty. Affect pleasant.   ASSESSMENT & PLAN: 1. Chronic Diastolic Heart Failure 03/06/12 ECHO EF 55-60%  Volume status stable. Goal for weight in the clinic 139-142 pounds.  Continue lasix 20mg  daily and 12.5 mg spironolactone daily   2. Pre-operative CV exam - S/P L Colectomy /colosotomy. Per Dr Carolynne Edouard she can have Colostomy Take Down.  She is at low to moderate risk for perioperative complications.  Can move forward if she agrees. Left message with triage nurse at [facility].   Follow up in 4-6 months   CLEGG,AMY 12:22 PM  Patient seen and examined with Tonye Becket, NP. We discussed all aspects of the encounter. I agree with the assessment and plan as stated above.   She is doing well from a HF perspective. Volume status looks good. Chronic NYHA III. Reinforced need for daily weights and reviewed use of sliding scale diuretics.   We discussed her pre-operative cardiac risk and I feel she is at low to moderate risk for peri-operative  cardiac events if she choses to proceed.  Daniel Bensimhon,MD 11:43 AM

## 2012-11-14 NOTE — Telephone Encounter (Signed)
LMOM> called to see where patient stands on scheduling surgery. Does she want to proceed. Received the cardiac clearance but not pulmonary clearance. Will wait to hear back from patient on her decision.

## 2012-11-25 ENCOUNTER — Other Ambulatory Visit (HOSPITAL_COMMUNITY): Payer: Self-pay | Admitting: Internal Medicine

## 2012-11-28 DIAGNOSIS — Z01810 Encounter for preprocedural cardiovascular examination: Secondary | ICD-10-CM | POA: Insufficient documentation

## 2012-12-22 ENCOUNTER — Other Ambulatory Visit: Payer: Self-pay | Admitting: Pulmonary Disease

## 2012-12-24 ENCOUNTER — Telehealth: Payer: Self-pay | Admitting: Pulmonary Disease

## 2012-12-24 MED ORDER — FLUTICASONE-SALMETEROL 100-50 MCG/DOSE IN AEPB: 1.0000 | INHALATION_SPRAY | Freq: Two times a day (BID) | RESPIRATORY_TRACT | Status: AC

## 2012-12-24 NOTE — Telephone Encounter (Signed)
Spoke with the pt's son  He states that the pt does need a refill on advair 100/50  The doc at the [facility] that she was at before was "probably" the one who was refilling this for her before She is not taking symbicort, but does take advair and spiriva  I have refilled the advair since she is out and will forward to SN so he is aware

## 2012-12-24 NOTE — Telephone Encounter (Signed)
Received fax from [facility] stating: "Per pt's son, Rylyn Zawistowski should be taking Advair.  She has not filled since we converted from [facility] in February.  We do not have an active prescription, can we get a new Rx for this?  Thanks!"  Per pt's last ov note w/ SN on 10.27.14, pt was taking Advair 100-25mcg.  However, pt's med list shows Symbicort 160 and Spiriva. The Advair was removed from pt's med list and the Symbicort was added on at the Feb/March 2014 hosp admission.  LMOM TCB x1 for [name], pt's son to find out that she is taking.  If she is indeed taking the Advair, where are they getting the refills from since our office has not filled this in almost 1 year?

## 2012-12-25 ENCOUNTER — Other Ambulatory Visit: Payer: Self-pay | Admitting: Pulmonary Disease

## 2012-12-25 MED ORDER — ZOLPIDEM TARTRATE 10 MG PO TABS: ORAL_TABLET | ORAL | Status: DC

## 2012-12-25 NOTE — Telephone Encounter (Signed)
Called, spoke with pt's son.  Informed him SN ok with pt taking the advair and spiriva.  He is aware advair rx was sent yesterday.  No spiriva rx is needed at this time.  He will call back if anything further is needed.  Will sign off.

## 2012-12-25 NOTE — Telephone Encounter (Signed)
Per SN----  Ok for the advair and the spiriva.  Med list has been updated.

## 2013-01-04 ENCOUNTER — Other Ambulatory Visit: Payer: Self-pay | Admitting: Pulmonary Disease

## 2013-01-08 ENCOUNTER — Other Ambulatory Visit: Payer: Self-pay | Admitting: Pulmonary Disease

## 2013-01-14 ENCOUNTER — Other Ambulatory Visit: Payer: Self-pay | Admitting: Pulmonary Disease

## 2013-01-28 ENCOUNTER — Encounter: Payer: Self-pay | Admitting: Pulmonary Disease

## 2013-01-28 ENCOUNTER — Ambulatory Visit (INDEPENDENT_AMBULATORY_CARE_PROVIDER_SITE_OTHER): Payer: Medicare Other | Admitting: Pulmonary Disease

## 2013-01-28 VITALS — BP 116/62 | HR 63 | Temp 96.8°F | Ht 64.0 in | Wt 141.2 lb

## 2013-01-28 DIAGNOSIS — F3289 Other specified depressive episodes: Secondary | ICD-10-CM

## 2013-01-28 DIAGNOSIS — I872 Venous insufficiency (chronic) (peripheral): Secondary | ICD-10-CM

## 2013-01-28 DIAGNOSIS — M1990 Unspecified osteoarthritis, unspecified site: Secondary | ICD-10-CM

## 2013-01-28 DIAGNOSIS — K869 Disease of pancreas, unspecified: Secondary | ICD-10-CM

## 2013-01-28 DIAGNOSIS — I340 Nonrheumatic mitral (valve) insufficiency: Secondary | ICD-10-CM

## 2013-01-28 DIAGNOSIS — I5032 Chronic diastolic (congestive) heart failure: Secondary | ICD-10-CM

## 2013-01-28 DIAGNOSIS — R0609 Other forms of dyspnea: Secondary | ICD-10-CM

## 2013-01-28 DIAGNOSIS — R269 Unspecified abnormalities of gait and mobility: Secondary | ICD-10-CM

## 2013-01-28 DIAGNOSIS — R0689 Other abnormalities of breathing: Secondary | ICD-10-CM

## 2013-01-28 DIAGNOSIS — F32A Depression, unspecified: Secondary | ICD-10-CM

## 2013-01-28 DIAGNOSIS — M899 Disorder of bone, unspecified: Secondary | ICD-10-CM

## 2013-01-28 DIAGNOSIS — M949 Disorder of cartilage, unspecified: Secondary | ICD-10-CM

## 2013-01-28 DIAGNOSIS — R0989 Other specified symptoms and signs involving the circulatory and respiratory systems: Secondary | ICD-10-CM

## 2013-01-28 DIAGNOSIS — D649 Anemia, unspecified: Secondary | ICD-10-CM

## 2013-01-28 DIAGNOSIS — K8689 Other specified diseases of pancreas: Secondary | ICD-10-CM

## 2013-01-28 DIAGNOSIS — I059 Rheumatic mitral valve disease, unspecified: Secondary | ICD-10-CM

## 2013-01-28 DIAGNOSIS — K219 Gastro-esophageal reflux disease without esophagitis: Secondary | ICD-10-CM

## 2013-01-28 DIAGNOSIS — Z933 Colostomy status: Secondary | ICD-10-CM

## 2013-01-28 DIAGNOSIS — J986 Disorders of diaphragm: Secondary | ICD-10-CM

## 2013-01-28 DIAGNOSIS — F329 Major depressive disorder, single episode, unspecified: Secondary | ICD-10-CM

## 2013-01-28 DIAGNOSIS — I509 Heart failure, unspecified: Secondary | ICD-10-CM

## 2013-01-28 DIAGNOSIS — M8580 Other specified disorders of bone density and structure, unspecified site: Secondary | ICD-10-CM

## 2013-01-28 DIAGNOSIS — M545 Low back pain, unspecified: Secondary | ICD-10-CM

## 2013-01-28 NOTE — Progress Notes (Signed)
Subjective:     Patient ID: Katie Galvan, female   DOB: 1927-03-02, 80 y.o.   MRN: [id number]  HPI  Review of Systems  Physical Exam  Subjective:    Patient ID: Annalynn Centanni, female    DOB: [birth year], 80 y.o.   MRN: [id number]  HPI: 23 y/o WF, mother of Eleina Jergens, who moved here from [place] in 2011...  she has multiple medical problems including chronically elev right hemidiaph;  HBP;  Diastolic dysfunction & mild PulmHTN on 2DEcho;  VI & Edema;  Hx Dyspagia & gastritis;  Divertics & ?colon polyp;  Hx UTI & renal failure from dehydration 3/12;  Severe DJD- s/p R.THR/ ?CPPD/ LBP/ Chr Pain Syndrome on MS Contin;  Osteopenia;  Anemia... SEE PREV EPIC NOTES FOR EARLIER DATA>>   ~  January 09, 2012:  52moROV & GBrookelynnhas had several f/u visits w/ her specialists as noted below;  Her CC= SOB but she looks to be at baseline, comfortable & in NAD; when pressed on the symptoms she describes SOB at rest & activity, trouble getting the air "in- like I can't get a deep breath"; she specifally denies cough, sput, chest congestion, etc and has not had f/c/s; in addition she denies CP, palpit, worsening edema, PND, etc but she doesn't like to lie flat- I offered hosp bed for positioning but she declines; note that exam is clear- elev right hemidiaph w/o change but no wheezing, rales, rhonchi, & cardiac exam unchanged as well... We decided to treat her symptom w/ low dose Klonopin 0.242m 1/2 tab twice daily as needed for the dyspnea...    She has had several f/u visits in the CHF clinic- last 01/01/12 for her mixed sys/diastolic HF> last 2DEcho 8/6/07howed recovery w. EF=65-70%, Gr1DD, mildMR, mild LAdil; they changed Coreg to BISOPROLOL5m68mand added ALDACTONE 84m60m they are checking blood work at home 7 she doesn't want additional labs from us [place]; she is rec to continue f/u in their clinic...    She has VI, stasis changes and chronic bilat LE leg wounds L>R; they are been attended  by the visiting nursBeavercreekff...    She saw DrJacobs for GI 11/13> Diverticulitis resolved, remote colonoscopy reported normal, the CT mass in the pancreas appeared to be similar to prev scan (10/12 CT Abd report "low attenuation along the uncinate process is nonspecific"- no change per DrJacobs & he felt this was likely a benign lesion & he did not favor aggressive work up or Bx- plans f/u CT in 6 months...    She had a 961mo56moappt w/ DrMohammed due to her anemia (ACD +Fe defic)> he noted Hg= 8.5-9.3 during her diverticulitis hosp; Hg was improved to 10.7 on oral Fe supplement- he rec continued oral Iron & f/u prn... We reviewed prob list, meds, xrays and labs> see below for updates >> we signed for a handicap sticker...  ~  April 17, 2012:  9mo R70mo Alaja Rosauradm to the hosp ([facility]) fDirk Dress61mo 3/42mo/ diverticulitis & ultimately had surg w/ left hemicolectomy & colostomy by DrToth, she had prolonged post-op course w/ ileus & TNA, [facility]; slowly improving & getting her strength back- she denies brweathing problem, CP, palpit, swelling etc (she has lost 17# down to 134# today... We reviewed the following medical problems during today's office visit >>     HxDyspnea, Elev right hemidiaph, Chr resp insuffic> on Claritin, Symbicort160, Spiriva; she  is off her HomeO2 since disch & breathing satis w/ less dyspnea, getting PT & ambulating w/ walker etc...    HxHBP, HxMyopericarditis, Chr diastolic CHF> on BZJ69, [id number], Lasix40-1/2 & Aldactone25-1/2 (on this at disch 3/14); BP= 106/72 & slowly improving; she had several f/u visits in the CHF clinic w/ several med changes- last 01/01/12 for her mixed sys/diastolic HF; last 2DEcho 5/10 showed recovery w/ EF=65-70%, Gr1DD, mildMR, mild LAdil...    VI, Stasis changes, Edema> prev venous stasis ulcers are healed & being managed by nursing staff at [facility] (& the wound doc who comes once per week)- they have her on  ZincGluconate 190m/d...    Hx Hyperlipidemia> she has been off meds x 23yrand never had f/u FLP; during the 3/14 hosp her Chol= 91-167 and TG=80-171 on TNA...    Hx HH, Gastitis, neg HPylori> on Protonix40, Zofran4; currently denies abd pain, dysphagia, n/v, etc...    Divertics, s/p left colon resection 3/14 w/ colostomy, Hx colon polyps> Adm 3/14 w/ recurrent diverticulitis & ultimately had left colon resection w/ colostomy by DrToth; stable post op 7 saw [facility] 4/14 w/ incision healed, colostomy ok, doing satis w/ rehab...     Cystic pancreatic mass> CT mass in the pancreas appeared to be similar to prev scan (10/12 CT Abd report "low attenuation along the uncinate process is nonspecific"- no change per DrJacobs & he felt this was likely a benign lesion & he did not favor aggressive work up or Bx- plans f/u CT in 6 months...    DJD, s/p rightTHR & subseq revion of acetab component, s/p left wrist fx w/ surg, c/o shoulder pain> she is off of her narcotic analgesics & just using OTC meds as needed...    Osteopenia> on Calcium, MVI, VitD, etc...     Hx Depression & Insomnia> on Celexa 2024m & prev Ambien as needed...    Hx Anemia> Hx ACD +Fe defic> on Fe-Bid + VitC, hosp labs showed Hg in the 8-10 range; she had blood work at [employer] Hg=14 (ok to decr the Fe to one daily) We reviewed prob list, meds, xrays and labs> see below for updates >>  We discussed getting her CMet & Fe levels checked...  ~  July 17, 2012:  49mo18mo & GracJacqueleenback in her apt [facility] she looks quite frail & more SOB; son describes a very busy day today w/ labs, CT Abd per DrJacobs, & then this appt; she has staff bathe her, home PT 2d per week, MikeRonalee Beltses daily to tend to her needs but feels she is ok w/ ADLs; she apparently fell recently over her walker w/ sm hematoma right shin- told to apply ice prn & it will takes weeks to resolve on it's own... Her CC is insomnia & Ambien 5mg 58ms  not help- she wants to incr to 10mg 72mand she is cautioned regarding side effects in an 85 y/o57ady; NOTE> she is off all narcotics now, still c/o knee pain/ shoulder pain and [name] pRonalee Belts to take her to DrBlackman for repeat eval...    She's had several f/u visits w/ [facility] & saw DrToth 5/14- s/p left & sigmoid colectomy for diverticulitis, ostomy working well & asked to ret prn; she is hoping for ostomy reversal in the near future...     She had f/u in DrBensimhon's CHF clinic 6/14> EF had recovered & last 2DEcho 8/13 showed EF=65-70%, Gr1DD, mildMR, mild LAdil; volume  status was good, no ch in meds, she had mod DOE & encouraged to wear O2 more regularly...     She had f/u CT Abd today per DrJacobs- f/u cystic pancreatic mass> it revealed complex area of hypoattenuation in head of thepancreas- sl larger than prev, no lymphadenopathy; 82m nonobstructing stone in lower pole collecting sys to left kidney, mult sm cysts bilat kidneys; colostomy in LUQ, extensive atherosclerotic change in Ao w/o aneurysm; elev right hemidiaph, atherosclerotic changes in coronaries, mild scarring at lung bases...     We reviewed prob list, meds, xrays and labs> see below for updates >>   ~  October 28, 2012:  363moOV & GrChiquettaooks a lot better than last OV, she has had a quiet interval w/o repeat hosp/ ER/ etc... [facility], unfortunately she has not been keeping up w/ her exercises/ mobility; family will check & see if we can re-order the PT for her... We reviewed the following medical problems during today's office visit >>     HxDyspnea, Elev right hemidiaph, Chr resp insuffic> on Claritin, Symbicort160, Spiriva; she is using her HomeO2 at night but not days- Ambulatory O2 monitor today showed> 90%sat on RA at rest w/ pulse80, & 87-88% on RA after 1lap w/ pulse87...     HxHBP, HxMyopericarditis, Chr diastolic CHF> on [id number], Lasix40-1/2 & Aldactone25-1/2; BP=  118/76 & slowly improving; she had prev f/u visits in the CHF clinic for her mixed sys/diastolic HF; last 2DEcho 8/9/62howed recovery w/ EF=65-70%, Gr1DD, mildMR, mild LAdil;  Home BP checks have been drifting higher but then turns low (no discernable pattern), she has Clonidine0.105m64mrn elev BP...    VI, Stasis changes, Edema> prev venous stasis ulcers are healed & [facility] per [facility])- they have her on ZincGluconate 100m67m..    Hx Hyperlipidemia> she has been off meds x 67yrs19yr never had f/u FLP; during the 3/14 hosp her Chol= 91-167 and TG=80-171 on TNA...    Hx HH, Gastitis, neg HPylori> on Protonix40, Zofran4; currently denies abd pain, dysphagia, n/v, etc...    Divertics, s/p left colon resection 3/14 for divertics w/ colostomy, Hx colon polyps> Adm 3/14 w/ recurrent diverticulitis & ultimately had left colon resection w/ colostomy by DrToth; stable post op & saw [facility] 4/14 w/ incision healed, colostomy ok, doing satis w/ rehab; she would like to have colostomy reversed but may be too frail- they will contact DrToth [facility], in the meanwhile- rec incr physical activity...    Cystic pancreatic mass> CT mass in the pancreas appeared to be similar to prev scan (10/12 CT Abd report "low attenuation along the uncinate process is nonspecific"- no change per DrJacobs & he felt this was likely a benign lesion & he did not favor aggressive work up or Bx- f/u CT done 7/14 w/ multiloc cystic mass involving head & uncinate process of pancreas, sl larger & felt to represent a slow growing cystic neoplasm like a serous cystadenoma, no adenopathy, 7mm s10me lower pole of left kid, atherosclerosis, etc...     DJD, s/p rightTHR & subseq revision of acetab component, s/p left wrist fx w/ surg, c/o shoulder pain> she is off of her narcotic analgesics & just using OTC meds as needed; saw DrBlackman for shot in shoulder & knee...     Osteopenia> on Calcium, MVI,  VitD, etc...Marland Kitchen  Hx Depression & Insomnia> on Celexa 45m/d & says she can't sleep w/o the Ambien...    Hx Anemia> Hx ACD +Fe defic> on Fe-daily + VitC; f/u labs 10/14 showed Hg=12.4, Fe=32 (12%sat) & rec to continue Fe daily. We reviewed prob list, meds, xrays and labs> see below for updates >> OK 2014 Flu vaccine today... Refills for Protonix, Spironolactone, Metoprolol, & Citalopram...  AMBULATORY O2>> 90%sat on RA at rest w/ pulse80, & 87-88% on RA after 1lap w/ pulse87 LABS 10/14:  Chems- wnl;  CBC- ok w/ Hg=12.4, Fe=32 (12%sat);  TSH=2.95;  VitD=64;  B12=750;  BNP=162...   ~  January 28, 2013:  379moOV & Anayely reports a stable interval- [facility] & now has the nurses come twice daily to oversee med administration...     She saw DrToth for [facility] in Oct re poss colostomy reversal> after their discussion she has pretty much decided to forget about it and she is doing better w/ the colostomy care using a new smaller bag that is less expensive & changed once daily...     She also had f/u DrBensimhon in Nov re her diastolic CHF> he reported: 12/12 ECHO EF recovered 55-60%; Echo 08/03/11 EF 65-70%. Grade 1 diastolic dysfunction. Mild MR. Mildly dilated LA; 03/2012 ECHO EF 55-60%; vol status stable & asked to keep wt ~140 range; continue Lasix20 & Aldactone12.5 daily; son indicates that they released her from follow up...    She has chr resp insuffic & elev right hemidiaph> on Advair100Bid & Spiriva daily; stable breathing w/o much cough, sput, no hemoptysis; stable DOE & way too sedentary & again asked to incr exercise...    BP, valv hrt dis, episode myopericarditis w/ decr LVF> nice recovery & back to baseline on ASA81, Metop25Bid, Lisin5, Lasix20 & she appears to be off the aldactone (prev 12.5); BP= 116/62 and they brought weight & BP record to review- satis...    Her CC is her arthritis w/ main prob area her knees; uses Voltaren gel, aspercream, Aleve, and asked to check in  w/ drBlackman, Ortho to see if shot would help... We reviewed prob list, meds, xrays and labs> see below for updates >>    [facility]: ~  [facility] after fall at home w/ comminuted left wrist fx & had surg by DrKuzma ~  [facility] w/ UTI, dehydration & renal insuffic> back to norm w/ hydration; also anemic, Fe defic & GI eval DrDBrodie w/ HH/ gastritis ~  Hosp 4/6 - 04/18/10 by DrBlackman for right THR & required a revision of the acetabular component 4d after the initial surg... ~  Hosp 6/2 - 06/13/10 w/ hypoxemic acute resp failure precipitated by fluid overload & diastolic CHF ([id number] assoc w/ her chr elev right hemidiaph, RLL atx/ scarring, & scoliosis w/ multilevel degen disc dis(w/ resultant restrictive lung disease)... ~  Hosp 10/7 - 10/19/10 w/ abd pain/ N/ V/ D & found to have ?colitis ?presumed infectious & resolved w/ Cipro/ Flagyl; also had CP & abn EKG/ Enz felt to be a myopericarditis... ~  [facility]/ [facility]/syncope ?etiology- felt to be poss orthostatic vs vagal; Neuro & CV evals were unrevealing, meds adjusted & improved... ~  [facility]/ [facility]- presented w/ LLQ pain x1d, WBC elev at 18K, CT showed sigmoid diverticulitis (wall thickening & inflamm changes); treated w/ Cipro/ Flagyl, slowly advanced diet, etc; GI extended the course of antibiotics for 1 more  week; also has cystic pancreatic mass & referred to [facility] for EUS, pos bx. ~  [place] 2/27 - 03/26/12 w/ diverticulitis & ultimately went for left hemicolectomy w/ colostomy by DrToth; post op complic by Ileus & need for TNA; disch 3/25 back to [facility] for rehab...           Problem List:    ACUTE ON CHRONIC RESP INSUFFICIENCY>  Precipitated 2/19 by diastolic CHF superimposed on her chronic resp problems etc... Now on ADVAIR100 Bid, SPIRIVA daily, O2 2L/ min... DIAPHRAGMATIC DISORDER (ICD-519.4) - she has a chronically elev right hemidiaph, apparently idiopathic; and she  is mostly asymptomatic w/o cough, phlegm, hemoptysis, ch in SOB, etc... ~  11/11:  CXR showed calcif Ao, elev right hemidiaph, mild scarring at bases, NAD... ~  6/12:  Presented to ER w/ hypoxemic resp failure due to vol overload assoc w/ her restrictive lung dis & chr pain syndrome requiring narcotic analgesics... ~  7/12:  O2 sats improved & OK to cut back the Oxygen to prn... ~  10/12:  Hosp w/ ?colitis and ?myopericarditis> developed fluid retention, CHF, abn EKG/ Enz & 2DEcho> seen by [facility] ==> now followed in the CHF clinic/ DrBensimhon & back on Oxygen regularly ==> weaned to exerc & Qhs. ~  5/13:  She is feeling better & wonders if she needs the O2; offered to check ambulatory O2 here & poss ONO at [facility] but she wants to wait... ~  7/13:  CXR showed chr changes & elev right hemidiaph, NAD... ~  11/13:  CXR showed borderline heart size & calcif Ao, elev right hemidiaph, right base atx, NAD.Marland Kitchen. ~  CXRs 3/14 hosp showed elev right hemidiaph & stable right basilar atx   Hx HYPERTENSION (ICD-401.9) VALVULAR HEART DISEASE (ICD-424.90) > prev trivial AI, mild MR... Hx of CARDIAC ARRHYTHMIA (ICD-427.9)  EPISODE of MYOPERICARDITIS w/ decr LVF> see 10/12 Hosp... ~  she was on ASA81, Diltiazem240, Vasotec20, & LASIX 6107m/d; but meds changed during the 10/12 Hosp to ASA 840md, LISINOPRIL 2.107m6107m, LASIX 29m1m==> freq med adjustments/ titrations from DrBeSturgis clinic... ~  11/11 & 6/12:  EKG showed NSR, NSSTTWA, NAD... ~  2DEcho 6/12 showed mild LVH, norm LVF w/ EF=55-60%, Gr 1 DD, trivial AI, heavily calcif mitral annulus & mild MR, PAsys est 46mm14m~  10/12:  SEE ABOVE + EChart records... Hosp w/ ?myopericarditis, elev enz, abn 2DEcho & meds adjusted> now followed freq via the CHF clinic/ DrBensimhon w/ freq med adjustments... ~  12/12:  Now much improved on COREG 6.25Bid, LISINOPRIL 2.107mg/d86mASIX 40mg/d67mIRONOLACTONE 12.107mg/d; 26mdown to 139# & BNP= 119... ~  Repeat 2DEcho 12/12  showed LVF recovered to 55-60% (LV cavity mildly dil, mild LVH, mild MR, PAsys=34mmHg).32m5/13: presents w/ incr SOB & eval shows wt=149# & BNP= 368 despite Lasix40 & Aldactone12.5; BP is also elev at 178/88; rec to increase Lasix to 80 for a few days ==> she reports improved and back to baseline...  ~  EKG 7/13 showed SBrady, rate53, minor NSSTTWA, otherw ok... ~  8/13: [facility]> 2DEcho showed mild LVH, norm LVF w/ EF=65-70%, Gr1DD, calcif mitral annulus, mildly thick leaflets, mild MR, mild LA dil, mild calcif AoV leaftlets, no AS/AI; now on ASA81, COREG6.25-1/2Bid, LISINOPRIL5, ALDACTONE25-1/2daily... ~  CDopplers 8/13 showed TDS due to anatomy & tortuosity- no signif extracranial carotid dis, vertebrals are patent & antegrade... ~  10/13:  BP= 110/80 & they are reminded to bring up-to-date list  of all meds to every visit... ~  Holter Monitor per DrBensimhon 11/13 showed NSR w/ PACs, no signif arrhythmia... ~  1/14:  BP= 128/66 & she notes some dyspnea but denies CP, palpit,, etc... ~  EKG 2/14 showed NSR, rate69, essentially wnl, NAD... ~  2DEcho 3/14 showed mild LVH, norm sys function w/ EF=55-60%, severe MAC w/ prolapse & mod to severe MR, mod LA dil, PAsys=58 ~  4/14:  on ASA81, Metop25Bid, Lasix40-1/2 & Aldactone25-1/2 (on this at disch 3/14); BP= 106/72 & slowly improving; she had several f/u visits in the CHF clinic w/ several med changes- last 01/01/12 for her mixed sys/diastolic HF; last 2DEcho 1/95 showed recovery w/ EF=65-70%, Gr1DD, mildMR, mild LAdil ~  7/14:  Same meds, BP= 126/74, she is chr ill appearing & more frail... ~  10/14:  on ASA81, Metop25Bid, Lisinopril5, Lasix40-1/2 & Aldactone25-1/2; BP= 118/76 & slowly improving; she had prev f/u visits in the CHF clinic for her mixed sys/diastolic HF; last 2DEcho 0/93 showed recovery w/ EF=65-70%, Gr1DD, mildMR, mild LAdil;  Home BP checks have been drifting higher but then turns low (no discernable  pattern), she has Clonidine0.16m prn elev BP.  VENOUS INSUFFICIENCY >> STASIS CHANGES & LEG ULCERS >> LEG EDEMA, CHRONIC (ICD-782.3) - she has severe venous stasis changes and chronic edema, thickened skin over LE's etc... she knows to be careful w/ hygiene, no salt, elevation, support hose, etc ~  8/12:  Stable on Lasix40, 1+chr edema, wt=137# ~  10/12:  Post hosp check on Lasix20, 3+edema, BNP=483, wt=152#; rec to incr Lasix to 440md... ~  12/12:  meds adjusted as above via CHF clinic & now K=3.6, TCO2=33, BUN=21, Creat=0.9, BNP=119 (her best yet)... ~  5/13:  We discussed adjusting Lasix to 40108maily & 64m75mn for swelling that doesn't go down overnight... ~  8/13:  Post hosp meds adjusted & off Lasix... ~  11/13:  Cards adjusted Lasix to 40mg56m weight >143# ~  1/14:  Bilat LE ulcers treated by VisitBadgerf w/ dressing changes etc... ~  7/14:  She has chr VI changes, no ulcers at present, new sm ant hematoma right lower leg from fall...  HYPERCHOLESTEROLEMIA (ICD-272.0) - she has been on Mevacor 20mg/67mom her [place] Perhaps [facility] can [facility] we cannot get her in for Fasting labs. ~  FLP during the 10/12 hosp showed TChol 146, TG 98, HDL 53, LDL 73 ~  [place], & mult med reconciliations- her Mevacor was dropped along the way; wants to leave it off & check FLP on diet alone. ~  4/14:  she has been off meds x 50yrs a46yrever had f/u FLP; during the 3/14 hosp her Chol= 91-167 and TG=80-171 on TNA.  OTHER DYSPHAGIA (ICD-787.29) - she was on PRILOSEC 20mg/d 38mports occas dysphagia but denies choking etc... she apparently has never had an Endoscopy or GI eval for this problem, "I have to be careful when I eat"... ~  6/12:  NOTE she had EGD 3/12 showing HH, gastritis, neg HPylori> rec increase PPI to Bid. ~  10/12:  She was discharge off her PPI therapy ==> she denies reflux symptoms. ~  8/13:   She states stable w/o abd pain, n/v, dysphagia, etc...  DIVERTICULOSIS OF COLON (ICD-562.10) & Recurrent Diverticulitis w/ left colon/ sigmoid resction, & colostomy 3/14 by DrToth... Hx of COLONIC POLYPS (ICD-211.3) - she tells  me that prev colonoscopy in [place] showed divertics & ?polyp but she does not recall any details...  she denies much constipation despite the narcotic analgesics- uses prunes as needed. ~  [facility] 10/12 w/ ?infectious colitis> resolved after Cipro/ Flagyl Rx ==> uses Miralax prn due to narcotics. ~  [facility] 10/13 w/ diverticulitis- presented w/ LLQ pain x1d, WBC elev at 18K, CT showed sigmoid diverticulitis (wall thickening & inflamm changes); treated w/ Cipro/ Flagyl, slowly advanced diet, etc; GI extended the course of antibiotics for 1 more week; also has cystic pancreatic mass & referred to [facility] for [facility]US, pos bx. ~  [facility] 2/27 - 03/26/12 w/ recurrent diverticulitis & ultimately underwent surg w/ left colon & sigmoid resection w/ colostomy by DrToth... CT Abd 3/14 revealed chr bowel wall thickening & inflamm in the sigm region; incidental findings included bilat renal cysts, s/p hyst...  ~  4/14: post op f/u by DrToth> doing satis in [place] at [facility] rehab, incision healed, ostomy is good... ~  7/14: she is back to independ living w/ Ronalee Belts caring for her daily, staff helps her bathe, getting PT... ~  10/14: she would like to have colostomy reversed but may be too frail- they will contact DrToth [facility], in the meanwhile- rec incr physical activity.  CYSTIC PANCREATIC MASS >> seen on CT Abd 10/13 when adm for diverticulitis; referred to [facility] for outpt evaluation & seen ~  CT Abd 10/13 showed hypodense mass at the uncinate process of the pancreas felt to represent a cystic neoplasm; mult other findings- sigmoid divertic colitis, atrophic kidneys w/ cysts and 33m stone in lower pole of left kidney, atherosclerosis... ~  CT Abd 7/14 per DrJacobs> complex area of hypoattenuation in head  of thepancreas- sl larger than prev, no lymphadenopathy; 713mnonobstructing stone in lower pole collecting sys to left kidney, mult sm cysts bilat kidneys; colostomy in LUQ, extensive atherosclerotic change in Ao w/o aneurysm; elev right hemidiaph, atherosclerotic changes in coronaries, mild scarring at lung bases.   DEGENERATIVE JOINT DISEASE (ICD-715.90) - she has severe osteoarthritis w/ XRays 11/11 showing severe osteopenia, severe right hip degeneration & relative sparing of the left hip joint;  DJD knees w/ ?CPPD, abn patella;  left wrist fx- radial  head & ulnar styloid... she had surg left wrist by DrKKuzma... ~  4/12:  DrBlackman did right THR but required revision of acetabular component 4d later> [facility]... ~  DrBlackman continues to follow w/ shots as needed for bursitis she says... ~  10/12:  She is c/o right shoulder pain & decr ROM, she wonders if this might be the cause of her chest discomfort; she will f/u w/ Ortho for XRays & shot... ~  8/13:  She is again c/o right shoulder pain & wonders if she injured it during one of her falls; she will f/u w/ DrBlackman for eval... ~  7/14:  She is c/o knee & shoulder pain, off all narcotics now, [employer]/u DrBlackman, Ortho... ~  Her CC is pain in the knees making ambulation (uses walker) and exercise difficult; she is reminded to f/u w/ Ortho re poss injection to see if this helps...  GYN >>  ~  2/13: pt mentioned some spotting & apparently has a pessary in place [place] & check up & we will refer... ~  2/13: she saw DrFernandez for GYN> said her vag walls & Cx were irritated & friable, he  removed pessary & Rx w/ estrace cream, she was to ret in 10d but didn't go (pessary is still out & she has not experienced any descensus so far); she will sched GYN follow up at her convenience...  Hx of BACK PAIN, LUMBAR (ICD-724.2) CHRONIC PAIN SYNDROME (ICD-338.4) - she has chronic  pain- mostly from her arthritic condition- and was started on MSContin by her LMD in [place] in the summer of 2011> dose slowly increased & she was on 27m Bid (taking a 651mtab + 3059mab Bid)... she does not believe that this med has anything to do w/ her fall & wrist fx, or her complaints of decr memory etc... we discussed the need to wean off this medication & try to deal w/ her arthritis pain thru an Orthopedist or poss a Pain Clinic... ~  11/11:  she notes pain worse during the day while up & about> try to decr MSContin to 56m17m& 60mg84m. ~  1/12 & 4/12:  she is down to 60mgB36m we discussed weaning further ==> she was down to 45mg (62m5) Bid by the time of her right THR... ~  6/12:  Recommend that she try to wean further> try 45am & 30pm... ~  7/12:  She's down to 30mg Bi66mwill wean further as able... ~  10/12:  She remains on the MSContin at 30mgBid.66m  12/12:  We discussed further slow wean down to 30-15 first... ~  2/13:  We discussed trying to wean down to 15mg Bid 56m the next few months, but she was unable to do so; states that DrBlackman feels it's due to her back... ~  8/13:  She is down to MSContin 15mgBid si34mher recent hosp... ~  11/13:  Hospitalists changed her MSContin to OXYCODONE & she was discharged on this... ~  4/14:  She is now post hosp for diverticulitis & left colon resection w/ colostomy; she was disch off of her narcotic analgesics & doing well...  OSTEOPENIA (ICD-733.90) - XRays here showed severe osteopenia... she notes that she used to be on Fosamax but ?how long? & when stopped?... she states that she was told her prev BMD was "good"... she has not been taking calcium supplements, but was prev on some OTC Vit D supplements... asked to resume Calcium, Women's MVI, Vit D 1000u daily...  Hx of DEPRESSION (ICD-311) - on CELEXA 20mg/d and 15mwants to continue this med... INSOMNIA >> on AMBIEN 10mg- 1/2 ta65m needed...  ANEMIA, MILD (ICD-285.9) - Hg post  op 11/11 wrist fx surg = 11.2 ~  6/12:  Labs showed Hg= 9.1, MCV= 77, Fe= 15 (7%sat), & she has B pos blood> we decided to incr Fe to Bid (may need IV Fe if not responding). ~  7/12:  Labs showed Hg= 10.0, MCV= 76, [facility] [place]  8/12: she reports that she had f/u Heme eval DrMohammed & "he released me" on Fe + VitC daily... ~  10/12:  Labs post hosp showed Hg= 11.0, MCV= 87 ~  Labs 5/13 showed Hg= 12.3 ~  10/13:  Labs during her Hosp for Diverticulits showed Hg down to 8.5; Disch on Fe supplementation... ~  11/13: she had f/u DrMohammed & Hg up to 10.7, rec to continue Fe... ~  4/14:  on Fe-Bid + VitC, hosp labs showed Hg in the 8-10 range; she [facility] [place] [employer] to decr the Fe to one  daily) ~  10/14: on Fe-daily + VitC; f/u labs 10/14 showed Hg=12.4, Fe=32 (12%sat) & rec to continue Fe daily.   Past Surgical History  Procedure Laterality Date  . Appendectomy    . Breast surgery      biopsy  . Left wrist surgery    . Bilateral cataracts    . Hip surgery      dr. Rush Farmer, twice because the bones were spongy  . Tonsillectomy    . Adenoidectomy    . Colostomy revision N/A 03/14/2012    Procedure: COLON RESECTION SIGMOID;  Surgeon: Merrie Roof, MD;  Location: [facility];  Service: General;  Laterality: N/A;  left colectomy with mobilization of splenic flexure  . Colostomy N/A 03/14/2012    Procedure: COLOSTOMY;  Surgeon: Merrie Roof, MD;  Location: [facility];  Service: General;  Laterality: N/A;    Outpatient Encounter Prescriptions as of 01/28/2013  Medication Sig  . aspirin 81 MG EC tablet Take 81 mg by mouth every morning.   . Calcium Carbonate-Vit D-Min (CALTRATE 600+D PLUS) 600-400 MG-UNIT per tablet Chew 1 tablet by mouth 2 (two) times daily.   . cholecalciferol (VITAMIN D) 1000 UNITS tablet Take 2,000 Units by mouth every morning.   . citalopram (CELEXA) 20 MG tablet TAKE 1 TABLET BY MOUTH EVERY MORNING  .  clotrimazole-betamethasone (LOTRISONE) cream Apply topically 2 (two) times daily.  . diclofenac sodium (VOLTAREN) 1 % GEL Apply to knee's two times daily  . ferrous sulfate 325 (65 FE) MG tablet Take 325 mg by mouth daily with breakfast.   . Fluticasone-Salmeterol (ADVAIR DISKUS) 100-50 MCG/DOSE AEPB Inhale 1 puff into the lungs 2 (two) times daily.  . furosemide (LASIX) 40 MG tablet Take 0.5 tablets (20 mg total) by mouth daily.  Marland Kitchen lisinopril (PRINIVIL,ZESTRIL) 5 MG tablet TAKE 1 TABLET BY MOUTH DAILY  . loratadine (CLARITIN) 10 MG tablet Take 10 mg by mouth every morning.   . metoprolol tartrate (LOPRESSOR) 25 MG tablet TAKE ONE TABLET Y MOUTH 2 TIMES DAILY  . Multiple Vitamin (MULTIVITAMIN WITH MINERALS) TABS Take 1 tablet by mouth every morning.   . ondansetron (ZOFRAN-ODT) 4 MG disintegrating tablet Take 4 mg by mouth every 8 (eight) hours as needed for nausea.  . pantoprazole (PROTONIX) 40 MG tablet Take 1 tablet (40 mg total) by mouth daily.  . Probiotic Product (ALIGN) 4 MG CAPS Take 1 capsule by mouth every morning.  Marland Kitchen spironolactone (ALDACTONE) 25 MG tablet Take 0.5 tablets (12.5 mg total) by mouth every morning.  . tiotropium (SPIRIVA) 18 MCG inhalation capsule Place 1 capsule (18 mcg total) into inhaler and inhale daily.  . vitamin C (ASCORBIC ACID) 500 MG tablet Take 500 mg by mouth 2 (two) times daily.   Marland Kitchen zinc gluconate 50 MG tablet Take 100 mg by mouth every morning.  . zolpidem (AMBIEN) 10 MG tablet Take 1/2 to 1 tablet by mouth at bedtime as needed for sleep  . [DISCONTINUED] lisinopril (PRINIVIL,ZESTRIL) 5 MG tablet Take 5 mg by mouth daily.    Allergies  Allergen Reactions  . Sulfonamide Derivatives Swelling    Current Medications, Allergies, Past Medical History, Past Surgical History, Family History, and Social History were reviewed in [employer] record.    Review of Systems: Constitutional:  Denies F/C/S, anorexia, unexpected weight  change. HEENT:  No HA, visual changes, earache, nasal symptoms, sore throat, hoarseness. Resp:  No cough, sputum, hemoptysis; mild SOB/ DOE noted... Cardio:  No CP,  palpit, orthopnea;  +edema & DOE but limited mobility. GI:  Denies N/V/D, tends toward constip due to narcotics; swallowing OK & denies reflux or abd pain.  GU:  No dysuria, freq, urgency, hematuria, or flank pain. MS:  Severe DJD w/ right hip w/ pain & decr ROM=>s/p right THR & improved... Neuro:  No tremors, seizures, dizziness, syncope; +weakness & multifactorial gait abn. Skin:  No suspicious lesions or skin rash. Heme:  No adenopathy, bruising, bleeding. Psyche: Denies confusion, sleep disturbance, hallucinations; +anxiety & situational depression.    Objective:  Physical Exam:  WD, WN, 80 y/o WF chr ill appearing but in NAD... Vital Signs:  Reviewed... General:  Alert & oriented; pleasant & cooperative... HEENT:  [place]/AT, EOM-wnl, PERRLA, EACs-clear, TMs-wnl, NOSE-clear, THROAT-clear & wnl. Neck:  Supple w/ decr ROM; no JVD; normal carotid impulses w/o bruits; no thyromegaly or nodules palpated; no lymphadenopathy. Chest:  Clear to P & A w/ decr BS at right base; without wheezes/ rales/ or rhonchi heard... Heart:  Regular Rhythm; norm S1 & S2, gr 1/6 SEM w/o rubs or gallops detected... Abdomen:  Soft & nontender; normal bowel sounds; no organomegaly or masses palpated... Ext:  decrROM; +deformities & mod arthritic changes; no varicose veins, +venous insuffic & no edema now;  Pulses intact w/o bruits... s/p right THR w/ revision; ambulates w/ walker... Neuro:  CNs intact;  No focal neuro deficits, in wheelchair & painful standing & walking... Derm:  No lesions noted; no rash etc... Lymph:  No cervical, supraclavicular, axillary, or inguinal adenopathy palpated...   RADIOLOGY DATA:  Reviewed in the EPIC EMR & discussed w/ the patient...  LABORATORY DATA:  Reviewed in the EPIC EMR & discussed w/ the  patient...   Assessment & Plan:    Hx Acute on Chr Resp Failure 6/12 Hosp>  Multifactorial w/ diastolicCHF, elev right hemidiaph, atx, scoliosis, restrictive lung dis, narcotic pain meds- all playing a role... Stable on Advair100 & Spiriva; She is only using it at night now...  Elev right hemidiaph>  This is chronic & assoc w/ some mild basilar atx; encouraged to get deep breaths & expand well...  HBP, etc>  She has had mult adjustments in her meds in & out of Hosp & thru the CHF clinic... Now controllled on her Metop, Lisinopril, Lasix, & off prev Aldactone.  Hx of Myopericarditis w/ abn EKG/ Enz & subseq vol overload w/ abn 2DEcho>  She improved back on her diuretic & meds were adjusted in the CHF clinic; repeat 2DEcho 12/12 w/ recovery of LVF & back to her baseline... F/u 2DEcho 8/13= improved w/ EF=65-70%, Gr1DD...  Diastolic CHF>  She is now on Lasix20 & off prev Aldactone125 & stable...  VI, Edema>  REC- no salt, elevate legs, TED hose, etc...  CHOL>  She has been off all statin rxs for some time now; we will endeavor to get an FLP when able...  GERD, Dysphagia>  She had EGD during prev hosp w/ HH, gastritis & neg HPylori;  Prev on PPI Rx but off now & she feels she is OK, we will follow.  Hx of Colitis- presumed infectious>  Resolved w/ Cipro/ Flagyl prev; then diverticulitis back on these meds & followed by GI for resolution... PANCREATIC LESION>> CT Abd showed cystic pancreatic neoplasm ?etiology; DrJacobs has advocated for conservative eval & Rx... Divertics, Constip>  Now s/p left colon resection w/ colostomy 3/14 by DrToth & she wants it reversed=> she will f/u w/ [facility]...  DJD, s/p right THR, Chr  Pain Syndrome> she is now off all narcs; uses Voltaren gel, aspercream, Aleve & reminded to f/u w/ DrBlackman...  Other medical problems as noted...     Patient's Medications  New Prescriptions   No medications on file  Previous Medications   ASPIRIN 81 MG EC TABLET    Take 81  mg by mouth every morning.    CALCIUM CARBONATE-VIT D-MIN (CALTRATE 600+D PLUS) 600-400 MG-UNIT PER TABLET    Chew 1 tablet by mouth 2 (two) times daily.    CHOLECALCIFEROL (VITAMIN D) 1000 UNITS TABLET    Take 2,000 Units by mouth every morning.    CITALOPRAM (CELEXA) 20 MG TABLET    TAKE 1 TABLET BY MOUTH EVERY MORNING   CLOTRIMAZOLE-BETAMETHASONE (LOTRISONE) CREAM    Apply topically 2 (two) times daily.   DICLOFENAC SODIUM (VOLTAREN) 1 % GEL    Apply to knee's two times daily   FERROUS SULFATE 325 (65 FE) MG TABLET    Take 325 mg by mouth daily with breakfast.    FLUTICASONE-SALMETEROL (ADVAIR DISKUS) 100-50 MCG/DOSE AEPB    Inhale 1 puff into the lungs 2 (two) times daily.   FUROSEMIDE (LASIX) 40 MG TABLET    Take 0.5 tablets (20 mg total) by mouth daily.   LISINOPRIL (PRINIVIL,ZESTRIL) 5 MG TABLET    TAKE 1 TABLET BY MOUTH DAILY   LORATADINE (CLARITIN) 10 MG TABLET    Take 10 mg by mouth every morning.    METOPROLOL TARTRATE (LOPRESSOR) 25 MG TABLET    TAKE ONE TABLET Y MOUTH 2 TIMES DAILY   MULTIPLE VITAMIN (MULTIVITAMIN WITH MINERALS) TABS    Take 1 tablet by mouth every morning.    ONDANSETRON (ZOFRAN-ODT) 4 MG DISINTEGRATING TABLET    Take 4 mg by mouth every 8 (eight) hours as needed for nausea.   PANTOPRAZOLE (PROTONIX) 40 MG TABLET    Take 1 tablet (40 mg total) by mouth daily.   PROBIOTIC PRODUCT (ALIGN) 4 MG CAPS    Take 1 capsule by mouth every morning.   SPIRONOLACTONE (ALDACTONE) 25 MG TABLET    Take 0.5 tablets (12.5 mg total) by mouth every morning.   TIOTROPIUM (SPIRIVA) 18 MCG INHALATION CAPSULE    Place 1 capsule (18 mcg total) into inhaler and inhale daily.   VITAMIN C (ASCORBIC ACID) 500 MG TABLET    Take 500 mg by mouth 2 (two) times daily.    ZINC GLUCONATE 50 MG TABLET    Take 100 mg by mouth every morning.   ZOLPIDEM (AMBIEN) 10 MG TABLET    Take 1/2 to 1 tablet by mouth at bedtime as needed for sleep  Modified Medications   No medications on file  Discontinued  Medications

## 2013-01-28 NOTE — Patient Instructions (Signed)
Today we updated your med list in our EPIC system...    Continue your current medications the same...  Keep up the good work & strive for increasing your exercise program & more interaction at [place]...  Call for any questions...  Let's plan a follow up visit in 51mo, sooner if needed for problems.Marland KitchenMarland Kitchen

## 2013-04-19 ENCOUNTER — Encounter (HOSPITAL_COMMUNITY): Payer: Self-pay | Admitting: Emergency Medicine

## 2013-04-19 ENCOUNTER — Emergency Department (HOSPITAL_COMMUNITY): Payer: Medicare Other

## 2013-04-19 ENCOUNTER — Inpatient Hospital Stay (HOSPITAL_COMMUNITY)
Admission: EM | Admit: 2013-04-19 | Discharge: 2013-05-02 | DRG: 388 | Disposition: E | Payer: Medicare Other | Attending: Internal Medicine | Admitting: Internal Medicine

## 2013-04-19 ENCOUNTER — Telehealth (INDEPENDENT_AMBULATORY_CARE_PROVIDER_SITE_OTHER): Payer: Self-pay | Admitting: General Surgery

## 2013-04-19 DIAGNOSIS — Z66 Do not resuscitate: Secondary | ICD-10-CM | POA: Diagnosis present

## 2013-04-19 DIAGNOSIS — J449 Chronic obstructive pulmonary disease, unspecified: Secondary | ICD-10-CM | POA: Diagnosis present

## 2013-04-19 DIAGNOSIS — J9600 Acute respiratory failure, unspecified whether with hypoxia or hypercapnia: Secondary | ICD-10-CM

## 2013-04-19 DIAGNOSIS — E872 Acidosis, unspecified: Secondary | ICD-10-CM | POA: Diagnosis present

## 2013-04-19 DIAGNOSIS — R5381 Other malaise: Secondary | ICD-10-CM | POA: Diagnosis present

## 2013-04-19 DIAGNOSIS — K219 Gastro-esophageal reflux disease without esophagitis: Secondary | ICD-10-CM | POA: Diagnosis present

## 2013-04-19 DIAGNOSIS — D649 Anemia, unspecified: Secondary | ICD-10-CM

## 2013-04-19 DIAGNOSIS — J9620 Acute and chronic respiratory failure, unspecified whether with hypoxia or hypercapnia: Secondary | ICD-10-CM | POA: Diagnosis present

## 2013-04-19 DIAGNOSIS — I5032 Chronic diastolic (congestive) heart failure: Secondary | ICD-10-CM

## 2013-04-19 DIAGNOSIS — J690 Pneumonitis due to inhalation of food and vomit: Secondary | ICD-10-CM | POA: Diagnosis present

## 2013-04-19 DIAGNOSIS — F3289 Other specified depressive episodes: Secondary | ICD-10-CM | POA: Diagnosis present

## 2013-04-19 DIAGNOSIS — R269 Unspecified abnormalities of gait and mobility: Secondary | ICD-10-CM | POA: Diagnosis present

## 2013-04-19 DIAGNOSIS — M545 Low back pain, unspecified: Secondary | ICD-10-CM | POA: Diagnosis present

## 2013-04-19 DIAGNOSIS — R6520 Severe sepsis without septic shock: Secondary | ICD-10-CM

## 2013-04-19 DIAGNOSIS — R6521 Severe sepsis with septic shock: Secondary | ICD-10-CM

## 2013-04-19 DIAGNOSIS — I5042 Chronic combined systolic (congestive) and diastolic (congestive) heart failure: Secondary | ICD-10-CM | POA: Diagnosis present

## 2013-04-19 DIAGNOSIS — K56609 Unspecified intestinal obstruction, unspecified as to partial versus complete obstruction: Secondary | ICD-10-CM | POA: Diagnosis present

## 2013-04-19 DIAGNOSIS — A419 Sepsis, unspecified organism: Secondary | ICD-10-CM | POA: Diagnosis present

## 2013-04-19 DIAGNOSIS — R3989 Other symptoms and signs involving the genitourinary system: Secondary | ICD-10-CM | POA: Diagnosis present

## 2013-04-19 DIAGNOSIS — Z85828 Personal history of other malignant neoplasm of skin: Secondary | ICD-10-CM

## 2013-04-19 DIAGNOSIS — E7800 Pure hypercholesterolemia, unspecified: Secondary | ICD-10-CM | POA: Diagnosis present

## 2013-04-19 DIAGNOSIS — G894 Chronic pain syndrome: Secondary | ICD-10-CM | POA: Diagnosis present

## 2013-04-19 DIAGNOSIS — Z79899 Other long term (current) drug therapy: Secondary | ICD-10-CM

## 2013-04-19 DIAGNOSIS — I509 Heart failure, unspecified: Secondary | ICD-10-CM | POA: Diagnosis present

## 2013-04-19 DIAGNOSIS — I10 Essential (primary) hypertension: Secondary | ICD-10-CM | POA: Diagnosis present

## 2013-04-19 DIAGNOSIS — K565 Intestinal adhesions [bands], unspecified as to partial versus complete obstruction: Principal | ICD-10-CM | POA: Diagnosis present

## 2013-04-19 DIAGNOSIS — I2789 Other specified pulmonary heart diseases: Secondary | ICD-10-CM | POA: Diagnosis present

## 2013-04-19 DIAGNOSIS — R918 Other nonspecific abnormal finding of lung field: Secondary | ICD-10-CM | POA: Diagnosis present

## 2013-04-19 DIAGNOSIS — Z791 Long term (current) use of non-steroidal anti-inflammatories (NSAID): Secondary | ICD-10-CM

## 2013-04-19 DIAGNOSIS — Z933 Colostomy status: Secondary | ICD-10-CM

## 2013-04-19 DIAGNOSIS — I872 Venous insufficiency (chronic) (peripheral): Secondary | ICD-10-CM | POA: Diagnosis present

## 2013-04-19 DIAGNOSIS — J986 Disorders of diaphragm: Secondary | ICD-10-CM | POA: Diagnosis present

## 2013-04-19 DIAGNOSIS — E860 Dehydration: Secondary | ICD-10-CM | POA: Diagnosis present

## 2013-04-19 DIAGNOSIS — J189 Pneumonia, unspecified organism: Secondary | ICD-10-CM

## 2013-04-19 DIAGNOSIS — J4489 Other specified chronic obstructive pulmonary disease: Secondary | ICD-10-CM | POA: Diagnosis present

## 2013-04-19 DIAGNOSIS — J9611 Chronic respiratory failure with hypoxia: Secondary | ICD-10-CM

## 2013-04-19 DIAGNOSIS — Z882 Allergy status to sulfonamides status: Secondary | ICD-10-CM

## 2013-04-19 DIAGNOSIS — N170 Acute kidney failure with tubular necrosis: Secondary | ICD-10-CM | POA: Diagnosis present

## 2013-04-19 DIAGNOSIS — I059 Rheumatic mitral valve disease, unspecified: Secondary | ICD-10-CM | POA: Diagnosis present

## 2013-04-19 DIAGNOSIS — R7989 Other specified abnormal findings of blood chemistry: Secondary | ICD-10-CM | POA: Diagnosis present

## 2013-04-19 DIAGNOSIS — M1990 Unspecified osteoarthritis, unspecified site: Secondary | ICD-10-CM | POA: Diagnosis present

## 2013-04-19 DIAGNOSIS — N19 Unspecified kidney failure: Secondary | ICD-10-CM | POA: Diagnosis present

## 2013-04-19 DIAGNOSIS — Z515 Encounter for palliative care: Secondary | ICD-10-CM

## 2013-04-19 DIAGNOSIS — D72829 Elevated white blood cell count, unspecified: Secondary | ICD-10-CM

## 2013-04-19 DIAGNOSIS — F329 Major depressive disorder, single episode, unspecified: Secondary | ICD-10-CM | POA: Diagnosis present

## 2013-04-19 DIAGNOSIS — Z860100 Personal history of colon polyps, unspecified: Secondary | ICD-10-CM

## 2013-04-19 DIAGNOSIS — Z7982 Long term (current) use of aspirin: Secondary | ICD-10-CM

## 2013-04-19 DIAGNOSIS — M949 Disorder of cartilage, unspecified: Secondary | ICD-10-CM

## 2013-04-19 DIAGNOSIS — M899 Disorder of bone, unspecified: Secondary | ICD-10-CM | POA: Diagnosis present

## 2013-04-19 DIAGNOSIS — Z9049 Acquired absence of other specified parts of digestive tract: Secondary | ICD-10-CM

## 2013-04-19 LAB — COMPREHENSIVE METABOLIC PANEL
ALBUMIN: 4.1 g/dL (ref 3.5–5.2)
ALT: 17 U/L (ref 0–35)
AST: 23 U/L (ref 0–37)
Alkaline Phosphatase: 87 U/L (ref 39–117)
BUN: 86 mg/dL — ABNORMAL HIGH (ref 6–23)
CO2: 22 mEq/L (ref 19–32)
Calcium: 11 mg/dL — ABNORMAL HIGH (ref 8.4–10.5)
Chloride: 98 mEq/L (ref 96–112)
Creatinine, Ser: 1.35 mg/dL — ABNORMAL HIGH (ref 0.50–1.10)
GFR calc Af Amer: 41 mL/min — ABNORMAL LOW (ref >90)
GFR calc non Af Amer: 35 mL/min — ABNORMAL LOW (ref >90)
Glucose, Bld: 196 mg/dL — ABNORMAL HIGH (ref 70–99)
POTASSIUM: 5.3 meq/L (ref 3.7–5.3)
SODIUM: 138 meq/L (ref 137–147)
TOTAL PROTEIN: 7.7 g/dL (ref 6.0–8.3)
Total Bilirubin: 0.4 mg/dL (ref 0.3–1.2)

## 2013-04-19 LAB — CBC WITH DIFFERENTIAL/PLATELET
Basophils Absolute: 0 10*3/uL (ref 0.0–0.1)
Basophils Relative: 0 % (ref 0–1)
EOS ABS: 0.1 10*3/uL (ref 0.0–0.7)
Eosinophils Relative: 1 % (ref 0–5)
HCT: 41.3 % (ref 36.0–46.0)
HEMOGLOBIN: 13.7 g/dL (ref 12.0–15.0)
LYMPHS ABS: 1.5 10*3/uL (ref 0.7–4.0)
Lymphocytes Relative: 8 % — ABNORMAL LOW (ref 12–46)
MCH: 30.4 pg (ref 26.0–34.0)
MCHC: 33.2 g/dL (ref 30.0–36.0)
MCV: 91.6 fL (ref 78.0–100.0)
MONOS PCT: 6 % (ref 3–12)
Monocytes Absolute: 1.2 10*3/uL — ABNORMAL HIGH (ref 0.1–1.0)
Neutro Abs: 16.8 10*3/uL — ABNORMAL HIGH (ref 1.7–7.7)
Neutrophils Relative %: 85 % — ABNORMAL HIGH (ref 43–77)
Platelets: 404 10*3/uL — ABNORMAL HIGH (ref 150–400)
RBC: 4.51 MIL/uL (ref 3.87–5.11)
RDW: 13.3 % (ref 11.5–15.5)
WBC: 19.7 10*3/uL — AB (ref 4.0–10.5)

## 2013-04-19 LAB — URINALYSIS, ROUTINE W REFLEX MICROSCOPIC
BILIRUBIN URINE: NEGATIVE
Glucose, UA: NEGATIVE mg/dL
HGB URINE DIPSTICK: NEGATIVE
Ketones, ur: NEGATIVE mg/dL
Leukocytes, UA: NEGATIVE
NITRITE: NEGATIVE
PH: 5 (ref 5.0–8.0)
Protein, ur: NEGATIVE mg/dL
SPECIFIC GRAVITY, URINE: 1.012 (ref 1.005–1.030)
Urobilinogen, UA: 0.2 mg/dL (ref 0.0–1.0)

## 2013-04-19 LAB — I-STAT CG4 LACTIC ACID, ED: Lactic Acid, Venous: 2.98 mmol/L — ABNORMAL HIGH (ref 0.5–2.2)

## 2013-04-19 LAB — LIPASE, BLOOD: LIPASE: 10 U/L — AB (ref 11–59)

## 2013-04-19 MED ORDER — CLOTRIMAZOLE 1 % EX CREA
TOPICAL_CREAM | Freq: Two times a day (BID) | CUTANEOUS | Status: DC
  Administered 2013-04-20: 22:00:00 via TOPICAL
  Filled 2013-04-19: qty 15

## 2013-04-19 MED ORDER — METOPROLOL TARTRATE 25 MG PO TABS
25.0000 mg | ORAL_TABLET | Freq: Two times a day (BID) | ORAL | Status: DC
  Administered 2013-04-20: 25 mg via ORAL
  Filled 2013-04-19 (×4): qty 1

## 2013-04-19 MED ORDER — CITALOPRAM HYDROBROMIDE 20 MG PO TABS
20.0000 mg | ORAL_TABLET | Freq: Every day | ORAL | Status: DC
  Administered 2013-04-20: 20 mg via ORAL
  Filled 2013-04-19 (×2): qty 1

## 2013-04-19 MED ORDER — LORATADINE 10 MG PO TABS
10.0000 mg | ORAL_TABLET | Freq: Every morning | ORAL | Status: DC
  Administered 2013-04-20: 10 mg via ORAL
  Filled 2013-04-19 (×2): qty 1

## 2013-04-19 MED ORDER — FERROUS SULFATE 325 (65 FE) MG PO TABS
325.0000 mg | ORAL_TABLET | Freq: Every day | ORAL | Status: DC
  Administered 2013-04-20 – 2013-04-21 (×2): 325 mg via ORAL
  Filled 2013-04-19 (×3): qty 1

## 2013-04-19 MED ORDER — TIOTROPIUM BROMIDE MONOHYDRATE 18 MCG IN CAPS
18.0000 ug | ORAL_CAPSULE | Freq: Every day | RESPIRATORY_TRACT | Status: DC
  Administered 2013-04-20 – 2013-04-21 (×2): 18 ug via RESPIRATORY_TRACT
  Filled 2013-04-19: qty 5

## 2013-04-19 MED ORDER — MORPHINE SULFATE 4 MG/ML IJ SOLN
4.0000 mg | Freq: Once | INTRAMUSCULAR | Status: AC
  Administered 2013-04-19: 4 mg via INTRAVENOUS
  Filled 2013-04-19: qty 1

## 2013-04-19 MED ORDER — MORPHINE SULFATE 2 MG/ML IJ SOLN
2.0000 mg | INTRAMUSCULAR | Status: DC | PRN
  Administered 2013-04-20 (×3): 2 mg via INTRAVENOUS
  Administered 2013-04-20 (×2): 4 mg via INTRAVENOUS
  Filled 2013-04-19: qty 1
  Filled 2013-04-19: qty 2
  Filled 2013-04-19 (×2): qty 1
  Filled 2013-04-19: qty 2

## 2013-04-19 MED ORDER — PANTOPRAZOLE SODIUM 40 MG PO TBEC
40.0000 mg | DELAYED_RELEASE_TABLET | Freq: Every day | ORAL | Status: DC
  Administered 2013-04-20: 40 mg via ORAL
  Filled 2013-04-19 (×2): qty 1

## 2013-04-19 MED ORDER — ONDANSETRON HCL 4 MG/2ML IJ SOLN
4.0000 mg | Freq: Once | INTRAMUSCULAR | Status: AC
Start: 2013-04-19 — End: 2013-04-19
  Administered 2013-04-19: 4 mg via INTRAVENOUS
  Filled 2013-04-19: qty 2

## 2013-04-19 MED ORDER — IOHEXOL 300 MG/ML  SOLN
50.0000 mL | Freq: Once | INTRAMUSCULAR | Status: AC | PRN
  Administered 2013-04-19: 50 mL via ORAL

## 2013-04-19 MED ORDER — ONDANSETRON HCL 4 MG/2ML IJ SOLN
4.0000 mg | Freq: Four times a day (QID) | INTRAMUSCULAR | Status: DC | PRN
  Administered 2013-04-20: 4 mg via INTRAVENOUS
  Filled 2013-04-19: qty 2

## 2013-04-19 MED ORDER — LIDOCAINE HCL 2 % EX GEL
CUTANEOUS | Status: AC
  Filled 2013-04-19: qty 10

## 2013-04-19 MED ORDER — SODIUM CHLORIDE 0.9 % IV SOLN
INTRAVENOUS | Status: DC
  Administered 2013-04-20 – 2013-04-21 (×2): via INTRAVENOUS

## 2013-04-19 MED ORDER — MOMETASONE FURO-FORMOTEROL FUM 100-5 MCG/ACT IN AERO
2.0000 | INHALATION_SPRAY | Freq: Two times a day (BID) | RESPIRATORY_TRACT | Status: DC
  Administered 2013-04-20 – 2013-04-21 (×3): 2 via RESPIRATORY_TRACT
  Filled 2013-04-19: qty 8.8

## 2013-04-19 MED ORDER — SODIUM CHLORIDE 0.9 % IV BOLUS (SEPSIS)
1000.0000 mL | Freq: Once | INTRAVENOUS | Status: AC
  Administered 2013-04-19: 1000 mL via INTRAVENOUS

## 2013-04-19 MED ORDER — ASPIRIN EC 81 MG PO TBEC
81.0000 mg | DELAYED_RELEASE_TABLET | Freq: Every morning | ORAL | Status: DC
  Administered 2013-04-20: 81 mg via ORAL
  Filled 2013-04-19 (×2): qty 1

## 2013-04-19 MED ORDER — HEPARIN SODIUM (PORCINE) 5000 UNIT/ML IJ SOLN
5000.0000 [IU] | Freq: Three times a day (TID) | INTRAMUSCULAR | Status: DC
  Administered 2013-04-20 – 2013-04-21 (×6): 5000 [IU] via SUBCUTANEOUS
  Filled 2013-04-19 (×12): qty 1

## 2013-04-19 NOTE — ED Provider Notes (Signed)
CSN: [id number]     Arrival date & time 04/28/2013  1711 History   First MD Initiated Contact with Patient 04/30/2013 1721     Chief Complaint  Patient presents with  . Abdominal Pain  . Emesis     (Consider location/radiation/quality/duration/timing/severity/associated sxs/prior Treatment) Patient is a 80 y.o. female presenting with abdominal pain and vomiting. The history is provided by the patient.  Abdominal Pain Pain location:  Generalized Pain quality: aching, gnawing and sharp   Pain radiates to:  Does not radiate Pain severity:  Severe Onset quality:  Gradual Duration:  15 hours Timing:  Constant Progression:  Worsening Chronicity:  Recurrent Context: awakening from sleep   Relieved by:  Nothing Worsened by:  Nothing tried Ineffective treatments:  Acetaminophen Associated symptoms: anorexia, nausea and vomiting   Associated symptoms: no chest pain, no cough, no diarrhea, no dysuria, no fever and no shortness of breath   Associated symptoms comment:  Stool in ostomy was normal color and no blood Risk factors: being elderly   Risk factors comment:  Hx of colitis and sepsis with colectomy and ostomy placed appx 1 year ago with no further problems until today Emesis Associated symptoms: abdominal pain   Associated symptoms: no diarrhea     Past Medical History  Diagnosis Date  . Disorders of diaphragm     right diaphragm elevation  . Unspecified essential hypertension   . Endocarditis, valve unspecified, unspecified cause   . Cardiac dysrhythmia, unspecified   . Unspecified venous (peripheral) insufficiency   . Edema   . Pure hypercholesterolemia   . Other dysphagia   . Diverticulosis of colon (without mention of hemorrhage)   . Benign neoplasm of colon   . Osteoarthrosis, unspecified whether generalized or localized, unspecified site   . Lumbago   . Chronic pain syndrome   . Disorder of bone and cartilage, unspecified   . Depressive disorder, not elsewhere  classified   . Anemia, unspecified   . Skin cancer   . Systolic heart failure     echo 10/10/10 EF 30%  . Hiatal hernia   . Gastritis   . COPD (chronic obstructive pulmonary disease)   . Lower extremity ulceration     dressing changes daily  . Gait disorder   . Chronic low back pain    Past Surgical History  Procedure Laterality Date  . Appendectomy    . Breast surgery      biopsy  . Left wrist surgery    . Bilateral cataracts    . Hip surgery      dr. Rush Farmer, twice because the bones were spongy  . Tonsillectomy    . Adenoidectomy    . Colostomy revision N/A 03/14/2012    Procedure: COLON RESECTION SIGMOID;  Surgeon: Merrie Roof, MD;  Location: [facility];  Service: General;  Laterality: N/A;  left colectomy with mobilization of splenic flexure  . Colostomy N/A 03/14/2012    Procedure: COLOSTOMY;  Surgeon: Merrie Roof, MD;  Location: [facility];  Service: General;  Laterality: N/A;   Family History  Problem Relation Age of Onset  . Diabetes Mother   . Heart disease Father   . Lung cancer Sister   . Alcohol abuse Sister   . Alcohol abuse Brother    History  Substance Use Topics  . Smoking status: Never Smoker   . Smokeless tobacco: Never Used  . Alcohol Use: No   OB History   Grav Para Term Preterm Abortions TAB  SAB Ect Mult Living   2 2 1 1      2      Review of Systems  Constitutional: Negative for fever.  Respiratory: Negative for cough and shortness of breath.   Cardiovascular: Negative for chest pain.  Gastrointestinal: Positive for nausea, vomiting, abdominal pain and anorexia. Negative for diarrhea.  Genitourinary: Negative for dysuria.  All other systems reviewed and are negative.     Allergies  Sulfonamide derivatives  Home Medications   Prior to Admission medications   Medication Sig Start Date End Date Taking? Authorizing Provider  aspirin 81 MG EC tablet Take 81 mg by mouth every morning.     Historical Provider, MD  Calcium Carbonate-Vit  D-Min (CALTRATE 600+D PLUS) 600-400 MG-UNIT per tablet Chew 1 tablet by mouth 2 (two) times daily.     Historical Provider, MD  cholecalciferol (VITAMIN D) 1000 UNITS tablet Take 2,000 Units by mouth every morning.     Historical Provider, MD  citalopram (CELEXA) 20 MG tablet TAKE 1 TABLET BY MOUTH EVERY MORNING 01/04/13   Noralee Space, MD  clotrimazole-betamethasone (LOTRISONE) cream Apply topically 2 (two) times daily. 04/17/12   Noralee Space, MD  diclofenac sodium (VOLTAREN) 1 % GEL Apply to knee's two times daily    Historical Provider, MD  ferrous sulfate 325 (65 FE) MG tablet Take 325 mg by mouth daily with breakfast.     Historical Provider, MD  Fluticasone-Salmeterol (ADVAIR DISKUS) 100-50 MCG/DOSE AEPB Inhale 1 puff into the lungs 2 (two) times daily. 12/24/12   Noralee Space, MD  furosemide (LASIX) 40 MG tablet Take 0.5 tablets (20 mg total) by mouth daily. 05/22/12   Noralee Space, MD  lisinopril (PRINIVIL,ZESTRIL) 5 MG tablet TAKE 1 TABLET BY MOUTH DAILY 01/08/13   Noralee Space, MD  loratadine (CLARITIN) 10 MG tablet Take 10 mg by mouth every morning.     Historical Provider, MD  metoprolol tartrate (LOPRESSOR) 25 MG tablet TAKE ONE TABLET Y MOUTH 2 TIMES DAILY 01/14/13   Noralee Space, MD  Multiple Vitamin (MULTIVITAMIN WITH MINERALS) TABS Take 1 tablet by mouth every morning.     Historical Provider, MD  ondansetron (ZOFRAN-ODT) 4 MG disintegrating tablet Take 4 mg by mouth every 8 (eight) hours as needed for nausea.    Historical Provider, MD  pantoprazole (PROTONIX) 40 MG tablet Take 1 tablet (40 mg total) by mouth daily. 10/28/12   Noralee Space, MD  Probiotic Product (ALIGN) 4 MG CAPS Take 1 capsule by mouth every morning.    Historical Provider, MD  spironolactone (ALDACTONE) 25 MG tablet Take 0.5 tablets (12.5 mg total) by mouth every morning. 10/28/12   Noralee Space, MD  tiotropium (SPIRIVA) 18 MCG inhalation capsule Place 1 capsule (18 mcg total) into inhaler and inhale daily.  09/05/12   Noralee Space, MD  vitamin C (ASCORBIC ACID) 500 MG tablet Take 500 mg by mouth 2 (two) times daily.     Historical Provider, MD  zinc gluconate 50 MG tablet Take 100 mg by mouth every morning.    Historical Provider, MD  zolpidem (AMBIEN) 10 MG tablet Take 1/2 to 1 tablet by mouth at bedtime as needed for sleep 12/25/12   Noralee Space, MD   BP 151/92  Pulse 92  Temp(Src) 98.6 F (37 C) (Oral)  Resp 16  SpO2 93% Physical Exam  Nursing note and vitals reviewed. Constitutional: She is oriented to person, place, and time. She appears well-developed  and well-nourished. No distress.  Appears uncomfortable  HENT:  Head: Normocephalic and atraumatic.  Mouth/Throat: Oropharynx is clear and moist.  Eyes: Conjunctivae and EOM are normal. Pupils are equal, round, and reactive to light.  Neck: Normal range of motion. Neck supple.  Cardiovascular: Normal rate, regular rhythm and intact distal pulses.   No murmur heard. Pulmonary/Chest: Effort normal and breath sounds normal. No respiratory distress. She has no wheezes. She has no rales.  Abdominal: Soft. Bowel sounds are normal. She exhibits no distension. There is tenderness. There is guarding. There is no rebound.  No stool in ostomy but pt emptied bag at 2pm  Musculoskeletal: Normal range of motion. She exhibits no edema and no tenderness.  Neurological: She is alert and oriented to person, place, and time.  Skin: Skin is warm and dry. No rash noted. No erythema.  Psychiatric: She has a normal mood and affect. Her behavior is normal.    ED Course  Procedures (including critical care time) Labs Review Labs Reviewed  COMPREHENSIVE METABOLIC PANEL - Abnormal; Notable for the following:    Glucose, Bld 196 (*)    BUN 86 (*)    Creatinine, Ser 1.35 (*)    Calcium 11.0 (*)    GFR calc non Af Amer 35 (*)    GFR calc Af Amer 41 (*)    All other components within normal limits  LIPASE, BLOOD - Abnormal; Notable for the following:     Lipase 10 (*)    All other components within normal limits  CBC WITH DIFFERENTIAL - Abnormal; Notable for the following:    WBC 19.7 (*)    Platelets 404 (*)    Neutrophils Relative % 85 (*)    Neutro Abs 16.8 (*)    Lymphocytes Relative 8 (*)    Monocytes Absolute 1.2 (*)    All other components within normal limits  I-STAT CG4 LACTIC ACID, ED - Abnormal; Notable for the following:    Lactic Acid, Venous 2.98 (*)    All other components within normal limits  URINALYSIS, ROUTINE W REFLEX MICROSCOPIC    Imaging Review Ct Abdomen Pelvis Wo Contrast  04/26/2013   CLINICAL DATA:  Abdominal pain.  Prior colostomy 1 year ago.  EXAM: CT ABDOMEN AND PELVIS WITHOUT CONTRAST  TECHNIQUE: Multidetector CT imaging of the abdomen and pelvis was performed following the standard protocol without intravenous contrast.  COMPARISON:  Plain films 04/24/2013.  CT 07/17/2012  FINDINGS: Marked elevation of the right hemidiaphragm. No confluent opacity in the lung bases. Heart is normal size.  Unenhanced appearance of the liver, spleen, pancreas, adrenals is unremarkable. Cortical thinning within the kidneys bilaterally. Small exophytic lesion off the midpole of the right kidney is stable since prior study, likely small cyst. Nonobstructing stone in the lower pole of the left kidney measures 6 mm. No hydronephrosis. No ureteral stones.  Uterus, adnexae and urinary bladder are unremarkable.  Left lower quadrant ostomy is noted. There are mildly prominent mid abdominal small bowel loops with scattered air-fluid levels. Transition point noted on image 47 in the midline of the upper pelvis were there appears to be kinking of the bowel, presumably related to adhesions. Distal small bowel is decompressed.  Aorta and iliac vessels are heavily calcified. Trace free fluid in the pelvis.  IMPRESSION: Mildly prominent proximal and mid small bowel loops with transition point in the mid small bowel, likely due to adhesions.  Findings compatible with mild small bowel obstruction.  Left lower quadrant ostomy noted,  grossly unremarkable.  Small amount of free fluid in the pelvis.  Marked elevation of the right hemidiaphragm, stable.   Electronically Signed   By: Rolm Baptise M.D.   On: 05-01-13 22:28   Dg Abd Acute W/chest  01-May-2013   CLINICAL DATA:  Generalized abdominal pain.  EXAM: ACUTE ABDOMEN SERIES (ABDOMEN 2 VIEW & CHEST 1 VIEW)  COMPARISON:  Chest x-ray 03/05/2012.  Abdominal imaging 03/02/2012.  FINDINGS: Stable marked elevation of the right hemidiaphragm. No confluent opacity in the lungs. Heart is normal size. No effusions.  Moderate stool burden throughout the colon. Nonobstructive bowel gas pattern. No organomegaly. There is a calcification which projects medial to the left kidney measuring 5 mm. There was a calcification within the lower pole of the left kidney previously. This could reflect migration of this stone into the left renal pelvis or proximal ureter. Recommend clinical correlation for flank pain and hematuria. Calcified phleboliths in the pelvis.  Severe rightward scoliosis in the lumbar spine with degenerative changes. Right hip replacement changes noted.  IMPRESSION: Questionable proximal left ureteral stone. Recommend clinical correlation for hematuria and flank pain.  Large stool burden throughout the colon. No evidence of bowel obstruction.  Stable marked elevation of the right.   Electronically Signed   By: Rolm Baptise M.D.   On: May 01, 2013 18:44     EKG Interpretation None      MDM   Final diagnoses:  None    Patient with a significant history for colitis status post colostomy approximately one year ago workup this morning at 2 AM with severe abdominal pain and one episode of vomiting prior to arrival. The abdominal pain is generalized she has had stool in her ostomy. She denies any urinary symptoms, fever, chest pain or shortness of breath.  Patient has diffuse tenderness and  guarding with positive bowel sounds. Concern for partial obstruction versus recurrent colitis versus perforation.  CBC, CMP, lipase, UA, lactate, ketones abdominal series, EKG pending. Patient given IV fluids, pain and nausea control.  7:29 PM CBC with a leukocytosis of 19,000, CMP with renal insufficiency with a creatinine of 1.35 from 0.7.  Lactate is elevated at 2.98. Urine is still pending. Acute abdominal series shows possible left ureteral stone no acute obstruction. We will do a CT of the abdomen and pelvis with contrast for further evaluation for stone versus abdominal process.  10:34 PM UA neg.  CT shows mild SBO with transition point in the mid small bowel.  Will discuss with surgery.  10:56 PM NGT will be placed and Dr Barry Dienes will consult.  Pt will be admitted to medicine.  Blanchie Dessert, MD 01-May-2013 [id number]

## 2013-04-19 NOTE — ED Notes (Signed)
Pt unable to urinate in bedpan.

## 2013-04-19 NOTE — Telephone Encounter (Signed)
Delayed entry. Phone call at 1:23 pm.  Son reported that mom had severe abdominal pain for around a day.  She has a history of diverticulitis.  Her son was inquiring about whether or not antibiotics would be of benefit.  I advised them to come to the ED.  With severe pain, she is at risk of having a perforation or an other cause of pain.  I did not advise antibiotics without more investigation.  The son was agreeable, but stated that his mother did not want to come.

## 2013-04-19 NOTE — ED Notes (Signed)
Katie Galvan (son) [phone].

## 2013-04-19 NOTE — ED Notes (Signed)
Pt refusing to drink the second cup of contrast. Pt has finished 1.

## 2013-04-19 NOTE — ED Notes (Signed)
Pt Oxygen saturation 87 on  Room air. Pt placed 2L O2. sats now 96%

## 2013-04-19 NOTE — H&P (Addendum)
[facility] History and Physical  MICHAELLA Galvan [id number] DOB: 05-13-1927 DOA: 04/05/2013  Referring physician: EDP PCP: Noralee Space, MD   Chief Complaint: Abdominal pain, emesis   HPI: Katie Galvan is a 80 y.o. female who presents to the ED with 15 hour history of abdominal pain.  Pain is generalized, aching, and has been worsening.  She tried tylenol without relief.  She developed vomiting this afternoon and so came in to the ED.  No fever, no diarrhea, no SOB.  She has an ostomy in place from a surgery 1 year ago for colitis with sepsis, resection of sigmoid colon during that time.  She does report decreased ostomy output today, specifically no stool in ostomy after emptying bag at 2 pm.  Work up in ED reveals SBO with adhesions.  Review of Systems: Systems reviewed.  As above, otherwise negative  Past Medical History  Diagnosis Date  . Disorders of diaphragm     right diaphragm elevation  . Unspecified essential hypertension   . Endocarditis, valve unspecified, unspecified cause   . Cardiac dysrhythmia, unspecified   . Unspecified venous (peripheral) insufficiency   . Edema   . Pure hypercholesterolemia   . Other dysphagia   . Diverticulosis of colon (without mention of hemorrhage)   . Benign neoplasm of colon   . Osteoarthrosis, unspecified whether generalized or localized, unspecified site   . Lumbago   . Chronic pain syndrome   . Disorder of bone and cartilage, unspecified   . Depressive disorder, not elsewhere classified   . Anemia, unspecified   . Skin cancer   . Systolic heart failure     echo 10/10/10 EF 30%  . Hiatal hernia   . Gastritis   . COPD (chronic obstructive pulmonary disease)   . Lower extremity ulceration     dressing changes daily  . Gait disorder   . Chronic low back pain    Past Surgical History  Procedure Laterality Date  . Appendectomy    . Breast surgery      biopsy  . Left wrist surgery    . Bilateral cataracts     . Hip surgery      dr. Rush Farmer, twice because the bones were spongy  . Tonsillectomy    . Adenoidectomy    . Colostomy revision N/A 03/14/2012    Procedure: COLON RESECTION SIGMOID;  Surgeon: Merrie Roof, MD;  Location: [facility];  Service: General;  Laterality: N/A;  left colectomy with mobilization of splenic flexure  . Colostomy N/A 03/14/2012    Procedure: COLOSTOMY;  Surgeon: Merrie Roof, MD;  Location: [facility];  Service: General;  Laterality: N/A;   Social History:  reports that she has never smoked. She has never used smokeless tobacco. She reports that she does not drink alcohol or use illicit drugs.  Allergies  Allergen Reactions  . Sulfonamide Derivatives Swelling    Family History  Problem Relation Age of Onset  . Diabetes Mother   . Heart disease Father   . Lung cancer Sister   . Alcohol abuse Sister   . Alcohol abuse Brother      Prior to Admission medications   Medication Sig Start Date End Date Taking? Authorizing Provider  aspirin 81 MG EC tablet Take 81 mg by mouth every morning.    Yes Historical Provider, MD  Calcium Carbonate-Vit D-Min (CALTRATE 600+D PLUS) 600-400 MG-UNIT per tablet Chew 1 tablet by mouth 2 (two) times daily.  Yes Historical Provider, MD  cholecalciferol (VITAMIN D) 1000 UNITS tablet Take 2,000 Units by mouth every morning.    Yes Historical Provider, MD  citalopram (CELEXA) 20 MG tablet Take 20 mg by mouth daily.   Yes Historical Provider, MD  clotrimazole-betamethasone (LOTRISONE) cream Apply topically 2 (two) times daily. 04/17/12  Yes Noralee Space, MD  diclofenac sodium (VOLTAREN) 1 % GEL Apply to knee's two times daily   Yes Historical Provider, MD  ferrous sulfate 325 (65 FE) MG tablet Take 325 mg by mouth daily with breakfast.    Yes Historical Provider, MD  Fluticasone-Salmeterol (ADVAIR DISKUS) 100-50 MCG/DOSE AEPB Inhale 1 puff into the lungs 2 (two) times daily. 12/24/12  Yes Noralee Space, MD  furosemide (LASIX) 40 MG tablet  Take 0.5 tablets (20 mg total) by mouth daily. 05/22/12  Yes Noralee Space, MD  lisinopril (PRINIVIL,ZESTRIL) 5 MG tablet Take 5 mg by mouth daily.   Yes Historical Provider, MD  loratadine (CLARITIN) 10 MG tablet Take 10 mg by mouth every morning.    Yes Historical Provider, MD  metoprolol tartrate (LOPRESSOR) 25 MG tablet Take 25 mg by mouth 2 (two) times daily.   Yes Historical Provider, MD  Multiple Vitamin (MULTIVITAMIN WITH MINERALS) TABS Take 1 tablet by mouth every morning.    Yes Historical Provider, MD  pantoprazole (PROTONIX) 40 MG tablet Take 1 tablet (40 mg total) by mouth daily. 10/28/12  Yes Noralee Space, MD  Probiotic Product (ALIGN) 4 MG CAPS Take 1 capsule by mouth every morning.   Yes Historical Provider, MD  spironolactone (ALDACTONE) 25 MG tablet Take 0.5 tablets (12.5 mg total) by mouth every morning. 10/28/12  Yes Noralee Space, MD  tiotropium (SPIRIVA) 18 MCG inhalation capsule Place 1 capsule (18 mcg total) into inhaler and inhale daily. 09/05/12  Yes Noralee Space, MD  vitamin C (ASCORBIC ACID) 500 MG tablet Take 500 mg by mouth 2 (two) times daily.    Yes Historical Provider, MD  zinc gluconate 50 MG tablet Take 100 mg by mouth every morning.   Yes Historical Provider, MD   Physical Exam: Filed Vitals:   04/14/2013 2100  BP: 116/46  Pulse: 87  Temp:   Resp: 25    BP 116/46  Pulse 87  Temp(Src) 98.6 F (37 C) (Oral)  Resp 25  SpO2 99%  General Appearance:    Alert, oriented, no distress, appears stated age  Head:    Normocephalic, atraumatic  Eyes:    PERRL, EOMI, sclera non-icteric        Nose:   Nares without drainage or epistaxis. Mucosa, turbinates normal  Throat:   Moist mucous membranes. Oropharynx without erythema or exudate.  Neck:   Supple. No carotid bruits.  No thyromegaly.  No lymphadenopathy.   Back:     No CVA tenderness, no spinal tenderness  Lungs:     Clear to auscultation bilaterally, without wheezes, rhonchi or rales  Chest wall:    No  tenderness to palpitation  Heart:    Regular rate and rhythm without murmurs, gallops, rubs  Abdomen:     Soft, tender, no rebound, does have guarding, nondistended, normal bowel sounds, no organomegaly  Genitalia:    deferred  Rectal:    deferred  Extremities:   No clubbing, cyanosis or edema.  Pulses:   2+ and symmetric all extremities  Skin:   Skin color, texture, turgor normal, no rashes or lesions  Lymph nodes:   Cervical, supraclavicular,  and axillary nodes normal  Neurologic:   CNII-XII intact. Normal strength, sensation and reflexes      throughout    Labs on Admission:  Basic Metabolic Panel:  Recent Labs Lab 04/26/2013 1740  NA 138  K 5.3  CL 98  CO2 22  GLUCOSE 196*  BUN 86*  CREATININE 1.35*  CALCIUM 11.0*   Liver Function Tests:  Recent Labs Lab Apr 23, 2013 1740  AST 23  ALT 17  ALKPHOS 87  BILITOT 0.4  PROT 7.7  ALBUMIN 4.1    Recent Labs Lab 04/11/2013 1740  LIPASE 10*   No results found for this basename: AMMONIA,  in the last 168 hours CBC:  Recent Labs Lab 04/30/2013 1740  WBC 19.7*  NEUTROABS 16.8*  HGB 13.7  HCT 41.3  MCV 91.6  PLT 404*   Cardiac Enzymes: No results found for this basename: CKTOTAL, CKMB, CKMBINDEX, TROPONINI,  in the last 168 hours  BNP (last 3 results)  Recent Labs  10/28/12 1730  PROBNP 162.0*   CBG: No results found for this basename: GLUCAP,  in the last 168 hours  Radiological Exams on Admission: Ct Abdomen Pelvis Wo Contrast  05/01/2013   CLINICAL DATA:  Abdominal pain.  Prior colostomy 1 year ago.  EXAM: CT ABDOMEN AND PELVIS WITHOUT CONTRAST  TECHNIQUE: Multidetector CT imaging of the abdomen and pelvis was performed following the standard protocol without intravenous contrast.  COMPARISON:  Plain films 23-Apr-2013.  CT 07/17/2012  FINDINGS: Marked elevation of the right hemidiaphragm. No confluent opacity in the lung bases. Heart is normal size.  Unenhanced appearance of the liver, spleen, pancreas,  adrenals is unremarkable. Cortical thinning within the kidneys bilaterally. Small exophytic lesion off the midpole of the right kidney is stable since prior study, likely small cyst. Nonobstructing stone in the lower pole of the left kidney measures 6 mm. No hydronephrosis. No ureteral stones.  Uterus, adnexae and urinary bladder are unremarkable.  Left lower quadrant ostomy is noted. There are mildly prominent mid abdominal small bowel loops with scattered air-fluid levels. Transition point noted on image 47 in the midline of the upper pelvis were there appears to be kinking of the bowel, presumably related to adhesions. Distal small bowel is decompressed.  Aorta and iliac vessels are heavily calcified. Trace free fluid in the pelvis.  IMPRESSION: Mildly prominent proximal and mid small bowel loops with transition point in the mid small bowel, likely due to adhesions. Findings compatible with mild small bowel obstruction.  Left lower quadrant ostomy noted, grossly unremarkable.  Small amount of free fluid in the pelvis.  Marked elevation of the right hemidiaphragm, stable.   Electronically Signed   By: Rolm Baptise M.D.   On: 04/24/2013 22:28   Dg Abd Acute W/chest  23-Apr-2013   CLINICAL DATA:  Generalized abdominal pain.  EXAM: ACUTE ABDOMEN SERIES (ABDOMEN 2 VIEW & CHEST 1 VIEW)  COMPARISON:  Chest x-ray 03/05/2012.  Abdominal imaging 03/02/2012.  FINDINGS: Stable marked elevation of the right hemidiaphragm. No confluent opacity in the lungs. Heart is normal size. No effusions.  Moderate stool burden throughout the colon. Nonobstructive bowel gas pattern. No organomegaly. There is a calcification which projects medial to the left kidney measuring 5 mm. There was a calcification within the lower pole of the left kidney previously. This could reflect migration of this stone into the left renal pelvis or proximal ureter. Recommend clinical correlation for flank pain and hematuria. Calcified phleboliths in the  pelvis.  Severe rightward scoliosis  in the lumbar spine with degenerative changes. Right hip replacement changes noted.  IMPRESSION: Questionable proximal left ureteral stone. Recommend clinical correlation for hematuria and flank pain.  Large stool burden throughout the colon. No evidence of bowel obstruction.  Stable marked elevation of the right.   Electronically Signed   By: Rolm Baptise M.D.   On: 04/24/2013 18:44    EKG: Independently reviewed.  Assessment/Plan Principal Problem:   SBO (small bowel obstruction) Active Problems:   Diastolic CHF, chronic   Acute prerenal azotemia   1. SBO - patient with SBO demonstrated on CT, Dr. Barry Dienes requested NGT and will consult on patient.  Keeping patient NPO. 2. AKI - patient has elevated BUN and creatinine in a pre-renal pattern.  Monitor intake and output, holding diuretics and ACEi, hydrating patient. 3. Leukocytosis - no other SIRS criteria, repeat CBC in AM, watch for development of other SIRS criteria, suspect that leukocytosis is related to stress with N/V due to #1 as opposed to a primary infectious process.    Code Status: Spoke with patient, she states she is DNR Family Communication: No family in room Disposition Plan: Admit to inpatient   Time spent: 61 min  [facility] Pager [phone]  If 7AM-7PM, please contact the day team taking care of the patient [email or web address] Password [facility] 04/02/2013, 11:23 PM

## 2013-04-19 NOTE — ED Notes (Signed)
Pt aware we need urine sample. Unable to obtain at this time.

## 2013-04-19 NOTE — ED Notes (Signed)
Unable to get NG tube down. MD made aware.

## 2013-04-19 NOTE — ED Notes (Signed)
Family wants to be notified of any changes.  [phone]

## 2013-04-19 NOTE — ED Notes (Signed)
Pt currently on bedpan  

## 2013-04-19 NOTE — ED Notes (Signed)
Pt states she began to have abd pain at 2am last night. Pt states she threw up 2x prior to arrival. Pt had colostomy placed 1 year ago, no complications. Pt states pain is generalized

## 2013-04-19 NOTE — Significant Event (Signed)
RN was unable to pass NGT, so no NGT for now.

## 2013-04-20 ENCOUNTER — Encounter (HOSPITAL_COMMUNITY): Payer: Self-pay | Admitting: General Practice

## 2013-04-20 DIAGNOSIS — D649 Anemia, unspecified: Secondary | ICD-10-CM

## 2013-04-20 DIAGNOSIS — D72829 Elevated white blood cell count, unspecified: Secondary | ICD-10-CM

## 2013-04-20 DIAGNOSIS — K56609 Unspecified intestinal obstruction, unspecified as to partial versus complete obstruction: Secondary | ICD-10-CM

## 2013-04-20 LAB — BASIC METABOLIC PANEL
BUN: 86 mg/dL — ABNORMAL HIGH (ref 6–23)
CHLORIDE: 102 meq/L (ref 96–112)
CO2: 23 meq/L (ref 19–32)
CREATININE: 1.69 mg/dL — AB (ref 0.50–1.10)
Calcium: 9.7 mg/dL (ref 8.4–10.5)
GFR calc non Af Amer: 27 mL/min — ABNORMAL LOW (ref >90)
GFR, EST AFRICAN AMERICAN: 31 mL/min — AB (ref >90)
Glucose, Bld: 149 mg/dL — ABNORMAL HIGH (ref 70–99)
POTASSIUM: 5.2 meq/L (ref 3.7–5.3)
SODIUM: 141 meq/L (ref 137–147)

## 2013-04-20 LAB — CBC
HCT: 38.5 % (ref 36.0–46.0)
Hemoglobin: 12.1 g/dL (ref 12.0–15.0)
MCH: 29.9 pg (ref 26.0–34.0)
MCHC: 31.4 g/dL (ref 30.0–36.0)
MCV: 95.1 fL (ref 78.0–100.0)
PLATELETS: 342 10*3/uL (ref 150–400)
RBC: 4.05 MIL/uL (ref 3.87–5.11)
RDW: 13.6 % (ref 11.5–15.5)
WBC: 14.8 10*3/uL — AB (ref 4.0–10.5)

## 2013-04-20 MED ORDER — HYDROMORPHONE HCL PF 1 MG/ML IJ SOLN
0.5000 mg | INTRAMUSCULAR | Status: DC | PRN
  Administered 2013-04-21 (×2): 0.5 mg via INTRAVENOUS
  Filled 2013-04-20 (×3): qty 1

## 2013-04-20 MED ORDER — CHLORHEXIDINE GLUCONATE 0.12 % MT SOLN
15.0000 mL | Freq: Two times a day (BID) | OROMUCOSAL | Status: DC
  Administered 2013-04-20 – 2013-04-21 (×3): 15 mL via OROMUCOSAL
  Filled 2013-04-20 (×7): qty 15

## 2013-04-20 MED ORDER — BIOTENE DRY MOUTH MT LIQD
15.0000 mL | Freq: Two times a day (BID) | OROMUCOSAL | Status: DC
  Administered 2013-04-21 (×2): 15 mL via OROMUCOSAL

## 2013-04-20 MED ORDER — SODIUM CHLORIDE 0.9 % IV BOLUS (SEPSIS)
500.0000 mL | Freq: Once | INTRAVENOUS | Status: AC
  Administered 2013-04-20: 500 mL via INTRAVENOUS

## 2013-04-20 MED ORDER — ALBUTEROL SULFATE (2.5 MG/3ML) 0.083% IN NEBU: 2.5000 mg | INHALATION_SOLUTION | Freq: Four times a day (QID) | RESPIRATORY_TRACT | Status: DC | PRN

## 2013-04-20 MED ORDER — HYDROMORPHONE HCL PF 1 MG/ML IJ SOLN
1.0000 mg | INTRAMUSCULAR | Status: DC | PRN
  Administered 2013-04-20: 1 mg via INTRAVENOUS
  Filled 2013-04-20: qty 1

## 2013-04-20 MED ORDER — NALOXONE HCL 0.4 MG/ML IJ SOLN
0.4000 mg | Freq: Once | INTRAMUSCULAR | Status: AC
  Administered 2013-04-20: 0.2 mg via INTRAVENOUS

## 2013-04-20 MED ORDER — NALOXONE HCL 0.4 MG/ML IJ SOLN
INTRAMUSCULAR | Status: AC
  Filled 2013-04-20: qty 1

## 2013-04-20 MED ORDER — SODIUM CHLORIDE 0.9 % IV SOLN: INTRAVENOUS | Status: AC

## 2013-04-20 NOTE — ED Notes (Signed)
Pt placed on 4 L of oxygen after placing NG tube. Pt. Lung sounds clear.

## 2013-04-20 NOTE — Progress Notes (Signed)
[facility] progress note. Chief complaint. Decreased saturations and decreased level of consciousness. History of present illness. This 80 year old female in hospital with small bowel obstruction. She was changed from morphine to Dilaudid 1 mg as needed IV every 2 inadequate pain control. Staff administered 1 mg of Dilaudid IV and then followup found the patient to be lethargic with decreased level of consciousness and decreased O2 sats. I was notified and came to the bedside along with rapid response. Patient was given 2 mg of Narcan with marked improvement in her level of consciousness. The patient was also hypotensive during this incident. Systolic blood pressures reported in the 80s. At the time I arrived at the bedside the patient was alert and in no distress. Vital signs. Temperature 97.7, pulse 74, respiration 12, blood pressure 82/58. O2 sats 94%. General appearance. Frail elderly female who is alert and in no distress. Cardiac. Rate and rhythm regular. Lungs. Breath sounds are clear and equal. Abdomen. Soft with diffuse pain. Hypotonic bowel sounds. Impression/plan. Problem #1. Decreased O2 sats, decreased level of consciousness, hypotension. His FOLFOX secondary to narcotic analgesics. The patient's blood pressure has normalized with systolic in the 121-975 range following a normal saline bolus. I will consciousness returned to baseline following Narcan. I have decreased the Dilaudid dosing by 1/2 to 0.5 mg every 4 hours as needed. Will place a continuous pulse oximetry to better monitor the patient.

## 2013-04-20 NOTE — Progress Notes (Signed)
[facility] PROGRESS NOTE  Katie Galvan:[id number] DOB: [birth year] DOA: 04/12/2013 PCP: Noralee Space, MD  Assessment/Plan: Principal Problem:  SBO (small bowel obstruction)  Active Problems:  Diastolic CHF, chronic  Acute prerenal azotemia  80 y/o female with PMH of HTN, CHF, COPD, anemia, s/p hartmann's procedure last fall by Dr. Marlou Starks for persistent diverticulitis presented with SBO   1. SBO; CT abd: Mildly prominent proximal and mid small bowel loops with transition point in the mid small bowel, likely due to adhesions. Findings compatible with mild small bowel obstruction. -NPO, IVF; defer management to surgery; appreciate the input  2.  AKI likely due to dehydration/prerenal +diuretics/ACE -cont IVF; monitor labs; hold diuretics/ACE   3. Leukocytosis likely due to SBO; wbc improved; afebrile; monitor   4. COPD; no wheezing on exam; chronically elevated R hemidiaphragm;  cont inhaler   5. chronic CHF with pulmonary HTN; echo (2014): LVEF 55-60%, PA peak pressure: 3mm Hg  -no s/s of fluid overload on exam; hold diuretics while on IVF   Code Status: DNR Family Communication: d/w patient, her granddaughter (indicate person spoken with, relationship, and if by phone, the number) Disposition Plan: home pend clinical improvement    Consultants:  surgery  Procedures:  none  Antibiotics:  none (indicate start date, and stop date if known)  HPI/Subjective: alert  Objective: Filed Vitals:   04/20/13 0521  BP: 113/71  Pulse: 90  Temp: 97.4 F (36.3 C)  Resp: 14    Intake/Output Summary (Last 24 hours) at 04/20/13 1015 Last data filed at 04/20/13 0545  Gross per 24 hour  Intake 595.83 ml  Output      0 ml  Net 595.83 ml   Filed Weights   04/20/13 0050  Weight: 62.596 kg (138 lb)    Exam:   General:  alert  Cardiovascular: s1,s2 rrr  Respiratory: diminished in LL  Abdomen: mild diffuse tender, +ostomy no output    Musculoskeletal: no LE edema   Data Reviewed: Basic Metabolic Panel:  Recent Labs Lab 04/11/2013 1740 04/20/13 0705  NA 138 141  K 5.3 5.2  CL 98 102  CO2 22 23  GLUCOSE 196* 149*  BUN 86* 86*  CREATININE 1.35* 1.69*  CALCIUM 11.0* 9.7   Liver Function Tests:  Recent Labs Lab 04/28/2013 1740  AST 23  ALT 17  ALKPHOS 87  BILITOT 0.4  PROT 7.7  ALBUMIN 4.1    Recent Labs Lab 04/25/2013 1740  LIPASE 10*   No results found for this basename: AMMONIA,  in the last 168 hours CBC:  Recent Labs Lab 04/11/2013 1740 04/20/13 0705  WBC 19.7* 14.8*  NEUTROABS 16.8*  --   HGB 13.7 12.1  HCT 41.3 38.5  MCV 91.6 95.1  PLT 404* 342   Cardiac Enzymes: No results found for this basename: CKTOTAL, CKMB, CKMBINDEX, TROPONINI,  in the last 168 hours BNP (last 3 results)  Recent Labs  10/28/12 1730  PROBNP 162.0*   CBG: No results found for this basename: GLUCAP,  in the last 168 hours  No results found for this or any previous visit (from the past 240 hour(s)).   Studies: Ct Abdomen Pelvis Wo Contrast  04/16/2013   CLINICAL DATA:  Abdominal pain.  Prior colostomy 1 year ago.  EXAM: CT ABDOMEN AND PELVIS WITHOUT CONTRAST  TECHNIQUE: Multidetector CT imaging of the abdomen and pelvis was performed following the standard protocol without intravenous contrast.  COMPARISON:  Plain films 04/18/2013.  CT 07/17/2012  FINDINGS:  Marked elevation of the right hemidiaphragm. No confluent opacity in the lung bases. Heart is normal size.  Unenhanced appearance of the liver, spleen, pancreas, adrenals is unremarkable. Cortical thinning within the kidneys bilaterally. Small exophytic lesion off the midpole of the right kidney is stable since prior study, likely small cyst. Nonobstructing stone in the lower pole of the left kidney measures 6 mm. No hydronephrosis. No ureteral stones.  Uterus, adnexae and urinary bladder are unremarkable.  Left lower quadrant ostomy is noted. There are mildly  prominent mid abdominal small bowel loops with scattered air-fluid levels. Transition point noted on image 47 in the midline of the upper pelvis were there appears to be kinking of the bowel, presumably related to adhesions. Distal small bowel is decompressed.  Aorta and iliac vessels are heavily calcified. Trace free fluid in the pelvis.  IMPRESSION: Mildly prominent proximal and mid small bowel loops with transition point in the mid small bowel, likely due to adhesions. Findings compatible with mild small bowel obstruction.  Left lower quadrant ostomy noted, grossly unremarkable.  Small amount of free fluid in the pelvis.  Marked elevation of the right hemidiaphragm, stable.   Electronically Signed   By: Rolm Baptise M.D.   On: 05/01/2013 22:28   Dg Abd Acute W/chest  04/18/2013   CLINICAL DATA:  Generalized abdominal pain.  EXAM: ACUTE ABDOMEN SERIES (ABDOMEN 2 VIEW & CHEST 1 VIEW)  COMPARISON:  Chest x-ray 03/05/2012.  Abdominal imaging 03/02/2012.  FINDINGS: Stable marked elevation of the right hemidiaphragm. No confluent opacity in the lungs. Heart is normal size. No effusions.  Moderate stool burden throughout the colon. Nonobstructive bowel gas pattern. No organomegaly. There is a calcification which projects medial to the left kidney measuring 5 mm. There was a calcification within the lower pole of the left kidney previously. This could reflect migration of this stone into the left renal pelvis or proximal ureter. Recommend clinical correlation for flank pain and hematuria. Calcified phleboliths in the pelvis.  Severe rightward scoliosis in the lumbar spine with degenerative changes. Right hip replacement changes noted.  IMPRESSION: Questionable proximal left ureteral stone. Recommend clinical correlation for hematuria and flank pain.  Large stool burden throughout the colon. No evidence of bowel obstruction.  Stable marked elevation of the right.   Electronically Signed   By: Rolm Baptise M.D.   On:  04/16/2013 18:44    Scheduled Meds: . sodium chloride   Intravenous STAT  . antiseptic oral rinse  15 mL Mouth Rinse q12n4p  . aspirin EC  81 mg Oral q morning - 10a  . chlorhexidine  15 mL Mouth Rinse BID  . citalopram  20 mg Oral Daily  . clotrimazole   Topical BID  . ferrous sulfate  325 mg Oral Q breakfast  . heparin  5,000 Units Subcutaneous 3 times per day  . lidocaine      . loratadine  10 mg Oral q morning - 10a  . metoprolol tartrate  25 mg Oral BID  . mometasone-formoterol  2 puff Inhalation BID  . pantoprazole  40 mg Oral Daily  . tiotropium  18 mcg Inhalation Daily   Continuous Infusions: . sodium chloride 125 mL/hr at 04/20/13 0059    Principal Problem:   SBO (small bowel obstruction) Active Problems:   Diastolic CHF, chronic   Acute prerenal azotemia    Time spent: >35 minutes     [name]  [facility] Pager [phone]. If 7PM-7AM, please contact night-coverage at [email or web address],  password [email or web address] 04/20/2013, 10:15 AM  LOS: 1 day

## 2013-04-20 NOTE — Consult Note (Signed)
Reason for Consult:SBO Referring Physician:  Blanchie Dessert, MD  Katie Galvan is an 80 y.o. female.  HPI:  Pt is an 80 yo F who presents with 24 hours of severe crampy abdominal pain that woke her from sleep.  She had hartmann's procedure last fall by Dr. Marlou Starks for persistent diverticulitis.  She has never had obstructive symptoms before.  She has not had any gas or stool in her ostomy appliance since this pain came on.  She had nausea, but no vomiting.  She was concerned about recurrent diverticulitis and did not want to come to the hospital, but did so when her son called and inquired about it.  She is feeling better since she came in.    Past Medical History  Diagnosis Date  . Disorders of diaphragm     right diaphragm elevation  . Unspecified essential hypertension   . Endocarditis, valve unspecified, unspecified cause   . Cardiac dysrhythmia, unspecified   . Unspecified venous (peripheral) insufficiency   . Edema   . Pure hypercholesterolemia   . Other dysphagia   . Diverticulosis of colon (without mention of hemorrhage)   . Benign neoplasm of colon   . Osteoarthrosis, unspecified whether generalized or localized, unspecified site   . Lumbago   . Chronic pain syndrome   . Disorder of bone and cartilage, unspecified   . Depressive disorder, not elsewhere classified   . Anemia, unspecified   . Skin cancer   . Systolic heart failure     echo 10/10/10 EF 30%  . Hiatal hernia   . Gastritis   . COPD (chronic obstructive pulmonary disease)   . Lower extremity ulceration     dressing changes daily  . Gait disorder   . Chronic low back pain     Past Surgical History  Procedure Laterality Date  . Appendectomy    . Breast surgery      biopsy  . Left wrist surgery    . Bilateral cataracts    . Hip surgery      dr. Rush Farmer, twice because the bones were spongy  . Tonsillectomy    . Adenoidectomy    . Colostomy revision N/A 03/14/2012    Procedure: COLON RESECTION  SIGMOID;  Surgeon: Merrie Roof, MD;  Location: [facility];  Service: General;  Laterality: N/A;  left colectomy with mobilization of splenic flexure  . Colostomy N/A 03/14/2012    Procedure: COLOSTOMY;  Surgeon: Merrie Roof, MD;  Location: [facility];  Service: General;  Laterality: N/A;    Family History  Problem Relation Age of Onset  . Diabetes Mother   . Heart disease Father   . Lung cancer Sister   . Alcohol abuse Sister   . Alcohol abuse Brother     Social History:  reports that she has never smoked. She has never used smokeless tobacco. She reports that she does not drink alcohol or use illicit drugs.  Allergies:  Allergies  Allergen Reactions  . Sulfonamide Derivatives Swelling    Medications:  Prior to Admission:  Prescriptions prior to admission  Medication Sig Dispense Refill  . aspirin 81 MG EC tablet Take 81 mg by mouth every morning.       . Calcium Carbonate-Vit D-Min (CALTRATE 600+D PLUS) 600-400 MG-UNIT per tablet Chew 1 tablet by mouth 2 (two) times daily.       . cholecalciferol (VITAMIN D) 1000 UNITS tablet Take 2,000 Units by mouth every morning.       Marland Kitchen  citalopram (CELEXA) 20 MG tablet Take 20 mg by mouth daily.      . clotrimazole-betamethasone (LOTRISONE) cream Apply topically 2 (two) times daily.  30 g  11  . diclofenac sodium (VOLTAREN) 1 % GEL Apply to knee's two times daily      . ferrous sulfate 325 (65 FE) MG tablet Take 325 mg by mouth daily with breakfast.       . Fluticasone-Salmeterol (ADVAIR DISKUS) 100-50 MCG/DOSE AEPB Inhale 1 puff into the lungs 2 (two) times daily.  60 each  5  . furosemide (LASIX) 40 MG tablet Take 0.5 tablets (20 mg total) by mouth daily.  30 tablet  5  . lisinopril (PRINIVIL,ZESTRIL) 5 MG tablet Take 5 mg by mouth daily.      Marland Kitchen loratadine (CLARITIN) 10 MG tablet Take 10 mg by mouth every morning.       . metoprolol tartrate (LOPRESSOR) 25 MG tablet Take 25 mg by mouth 2 (two) times daily.      . Multiple Vitamin  (MULTIVITAMIN WITH MINERALS) TABS Take 1 tablet by mouth every morning.       . pantoprazole (PROTONIX) 40 MG tablet Take 1 tablet (40 mg total) by mouth daily.  30 tablet  11  . Probiotic Product (ALIGN) 4 MG CAPS Take 1 capsule by mouth every morning.      Marland Kitchen spironolactone (ALDACTONE) 25 MG tablet Take 0.5 tablets (12.5 mg total) by mouth every morning.  15 tablet  11  . tiotropium (SPIRIVA) 18 MCG inhalation capsule Place 1 capsule (18 mcg total) into inhaler and inhale daily.  30 capsule  11  . vitamin C (ASCORBIC ACID) 500 MG tablet Take 500 mg by mouth 2 (two) times daily.       Marland Kitchen zinc gluconate 50 MG tablet Take 100 mg by mouth every morning.        Results for orders placed during the hospital encounter of 04/27/2013 (from the past 48 hour(s))  COMPREHENSIVE METABOLIC PANEL     Status: Abnormal   Collection Time    05/01/2013  5:40 PM      Result Value Ref Range   Sodium 138  137 - 147 mEq/L   Potassium 5.3  3.7 - 5.3 mEq/L   Chloride 98  96 - 112 mEq/L   CO2 22  19 - 32 mEq/L   Glucose, Bld 196 (*) 70 - 99 mg/dL   BUN 86 (*) 6 - 23 mg/dL   Creatinine, Ser 1.35 (*) 0.50 - 1.10 mg/dL   Calcium 11.0 (*) 8.4 - 10.5 mg/dL   Total Protein 7.7  6.0 - 8.3 g/dL   Albumin 4.1  3.5 - 5.2 g/dL   AST 23  0 - 37 U/L   ALT 17  0 - 35 U/L   Alkaline Phosphatase 87  39 - 117 U/L   Total Bilirubin 0.4  0.3 - 1.2 mg/dL   GFR calc non Af Amer 35 (*) >90 mL/min   GFR calc Af Amer 41 (*) >90 mL/min   Comment: (NOTE)     The eGFR has been calculated using the CKD EPI equation.     This calculation has not been validated in all clinical situations.     eGFR's persistently <90 mL/min signify possible Chronic Kidney     Disease.  LIPASE, BLOOD     Status: Abnormal   Collection Time    04/18/2013  5:40 PM      Result Value Ref Range  Lipase 10 (*) 11 - 59 U/L  CBC WITH DIFFERENTIAL     Status: Abnormal   Collection Time    04/30/2013  5:40 PM      Result Value Ref Range   WBC 19.7 (*) 4.0 - 10.5  K/uL   RBC 4.51  3.87 - 5.11 MIL/uL   Hemoglobin 13.7  12.0 - 15.0 g/dL   HCT 41.3  36.0 - 46.0 %   MCV 91.6  78.0 - 100.0 fL   MCH 30.4  26.0 - 34.0 pg   MCHC 33.2  30.0 - 36.0 g/dL   RDW 13.3  11.5 - 15.5 %   Platelets 404 (*) 150 - 400 K/uL   Neutrophils Relative % 85 (*) 43 - 77 %   Neutro Abs 16.8 (*) 1.7 - 7.7 K/uL   Lymphocytes Relative 8 (*) 12 - 46 %   Lymphs Abs 1.5  0.7 - 4.0 K/uL   Monocytes Relative 6  3 - 12 %   Monocytes Absolute 1.2 (*) 0.1 - 1.0 K/uL   Eosinophils Relative 1  0 - 5 %   Eosinophils Absolute 0.1  0.0 - 0.7 K/uL   Basophils Relative 0  0 - 1 %   Basophils Absolute 0.0  0.0 - 0.1 K/uL  I-STAT CG4 LACTIC ACID, ED     Status: Abnormal   Collection Time    04/24/2013  6:50 PM      Result Value Ref Range   Lactic Acid, Venous 2.98 (*) 0.5 - 2.2 mmol/L  URINALYSIS, ROUTINE W REFLEX MICROSCOPIC     Status: Abnormal   Collection Time    04/28/2013  8:32 PM      Result Value Ref Range   Color, Urine YELLOW  YELLOW   APPearance CLOUDY (*) CLEAR   Specific Gravity, Urine 1.012  1.005 - 1.030   pH 5.0  5.0 - 8.0   Glucose, UA NEGATIVE  NEGATIVE mg/dL   Hgb urine dipstick NEGATIVE  NEGATIVE   Bilirubin Urine NEGATIVE  NEGATIVE   Ketones, ur NEGATIVE  NEGATIVE mg/dL   Protein, ur NEGATIVE  NEGATIVE mg/dL   Urobilinogen, UA 0.2  0.0 - 1.0 mg/dL   Nitrite NEGATIVE  NEGATIVE   Leukocytes, UA NEGATIVE  NEGATIVE   Comment: MICROSCOPIC NOT DONE ON URINES WITH NEGATIVE PROTEIN, BLOOD, LEUKOCYTES, NITRITE, OR GLUCOSE <1000 mg/dL.    Ct Abdomen Pelvis Wo Contrast  04/17/2013   CLINICAL DATA:  Abdominal pain.  Prior colostomy 1 year ago.  EXAM: CT ABDOMEN AND PELVIS WITHOUT CONTRAST  TECHNIQUE: Multidetector CT imaging of the abdomen and pelvis was performed following the standard protocol without intravenous contrast.  COMPARISON:  Plain films 04/15/2013.  CT 07/17/2012  FINDINGS: Marked elevation of the right hemidiaphragm. No confluent opacity in the lung bases.  Heart is normal size.  Unenhanced appearance of the liver, spleen, pancreas, adrenals is unremarkable. Cortical thinning within the kidneys bilaterally. Small exophytic lesion off the midpole of the right kidney is stable since prior study, likely small cyst. Nonobstructing stone in the lower pole of the left kidney measures 6 mm. No hydronephrosis. No ureteral stones.  Uterus, adnexae and urinary bladder are unremarkable.  Left lower quadrant ostomy is noted. There are mildly prominent mid abdominal small bowel loops with scattered air-fluid levels. Transition point noted on image 47 in the midline of the upper pelvis were there appears to be kinking of the bowel, presumably related to adhesions. Distal small bowel is decompressed.  Aorta and  iliac vessels are heavily calcified. Trace free fluid in the pelvis.  IMPRESSION: Mildly prominent proximal and mid small bowel loops with transition point in the mid small bowel, likely due to adhesions. Findings compatible with mild small bowel obstruction.  Left lower quadrant ostomy noted, grossly unremarkable.  Small amount of free fluid in the pelvis.  Marked elevation of the right hemidiaphragm, stable.   Electronically Signed   By: Rolm Baptise M.D.   On: 04/24/2013 22:28   Dg Abd Acute W/chest  04/18/2013   CLINICAL DATA:  Generalized abdominal pain.  EXAM: ACUTE ABDOMEN SERIES (ABDOMEN 2 VIEW & CHEST 1 VIEW)  COMPARISON:  Chest x-ray 03/05/2012.  Abdominal imaging 03/02/2012.  FINDINGS: Stable marked elevation of the right hemidiaphragm. No confluent opacity in the lungs. Heart is normal size. No effusions.  Moderate stool burden throughout the colon. Nonobstructive bowel gas pattern. No organomegaly. There is a calcification which projects medial to the left kidney measuring 5 mm. There was a calcification within the lower pole of the left kidney previously. This could reflect migration of this stone into the left renal pelvis or proximal ureter. Recommend  clinical correlation for flank pain and hematuria. Calcified phleboliths in the pelvis.  Severe rightward scoliosis in the lumbar spine with degenerative changes. Right hip replacement changes noted.  IMPRESSION: Questionable proximal left ureteral stone. Recommend clinical correlation for hematuria and flank pain.  Large stool burden throughout the colon. No evidence of bowel obstruction.  Stable marked elevation of the right.   Electronically Signed   By: Rolm Baptise M.D.   On: 04/04/2013 18:44    Review of Systems  Constitutional: Negative.   HENT: Negative.   Eyes: Negative.   Respiratory: Negative.   Cardiovascular: Negative.   Gastrointestinal: Positive for nausea and abdominal pain.  Genitourinary: Negative.   Musculoskeletal: Negative.   Skin: Negative.   Neurological: Negative.   Endo/Heme/Allergies: Negative.   Psychiatric/Behavioral: Negative.    Blood pressure 113/71, pulse 90, temperature 97.4 F (36.3 C), temperature source Oral, resp. rate 14, height _0  (1.6 m), weight 138 lb (62.596 kg), SpO2 96.00%. Physical Exam  Constitutional: She is oriented to person, place, and time. She appears well-developed and well-nourished. No distress.  HENT:  Head: Normocephalic and atraumatic.  Eyes: Conjunctivae are normal. Pupils are equal, round, and reactive to light. No scleral icterus.  Neck: Normal range of motion. Neck supple. No thyromegaly present.  Cardiovascular: Normal rate, regular rhythm, normal heart sounds and intact distal pulses.  Exam reveals no gallop and no friction rub.   No murmur heard. Respiratory: Effort normal and breath sounds normal. No respiratory distress. She has no wheezes. She has no rales. She exhibits no tenderness.  GI: Soft. She exhibits no distension and no mass. There is no tenderness. There is no rebound and no guarding.  Hypoactive bowel sounds, no flatus or stool in ostomy bag   Musculoskeletal: Normal range of motion. She exhibits no edema  and no tenderness.  Neurological: She is alert and oriented to person, place, and time.  Skin: Skin is warm and dry. No rash noted. She is not diaphoretic. No erythema. No pallor.  Psychiatric: She has a normal mood and affect. Her behavior is normal. Judgment and thought content normal.    Assessment/Plan: SBO NPO IVF May need additional attempt at NGT if no flatus by tomorrow, or if she develops nausea/vomiting.   Hopefully this will resolve non operatively  Stark Klein 04/20/2013, 7:28 AM

## 2013-04-20 NOTE — Progress Notes (Signed)
Spoke with Pt's son, Ronalee Belts, re: Pt's current living arrangements.  Per Ronalee Belts, Pt currently resides in the independent living section of [facility].  He isn't sure if Pt will need SNF upon d/c but, if she does, he'd like for her to go [facility] SNF; Pt has been there before.  CSW to continue to monitor Pt's progress and assist with d/c plans, as needed.  Bernita Raisin, [place] [employer] [phone]

## 2013-04-20 NOTE — Progress Notes (Signed)
Followed up with pt after rapid response.  Pt is calm.  Pt states that her pain is in control and she does not need anything.  Partial rebreather mask was bothering her, altered it on her face to fit better.  BP check was 90/60s, close to what she was running before dilaudid given earlier.  O2 sats 94%. Respirations 16. Pt states it is not difficult for her to breathe. Told pt to call when she needed anything. Will continue to monitor.  Roselind Rily, RN

## 2013-04-21 ENCOUNTER — Inpatient Hospital Stay (HOSPITAL_COMMUNITY): Payer: Medicare Other

## 2013-04-21 DIAGNOSIS — Z66 Do not resuscitate: Secondary | ICD-10-CM

## 2013-04-21 DIAGNOSIS — J189 Pneumonia, unspecified organism: Secondary | ICD-10-CM

## 2013-04-21 DIAGNOSIS — G894 Chronic pain syndrome: Secondary | ICD-10-CM

## 2013-04-21 DIAGNOSIS — Z515 Encounter for palliative care: Secondary | ICD-10-CM

## 2013-04-21 DIAGNOSIS — J690 Pneumonitis due to inhalation of food and vomit: Secondary | ICD-10-CM

## 2013-04-21 DIAGNOSIS — J9620 Acute and chronic respiratory failure, unspecified whether with hypoxia or hypercapnia: Secondary | ICD-10-CM

## 2013-04-21 DIAGNOSIS — J9610 Chronic respiratory failure, unspecified whether with hypoxia or hypercapnia: Secondary | ICD-10-CM

## 2013-04-21 DIAGNOSIS — J9600 Acute respiratory failure, unspecified whether with hypoxia or hypercapnia: Secondary | ICD-10-CM

## 2013-04-21 DIAGNOSIS — R0902 Hypoxemia: Secondary | ICD-10-CM

## 2013-04-21 LAB — CBC
HCT: 33.3 % — ABNORMAL LOW (ref 36.0–46.0)
HEMOGLOBIN: 10.8 g/dL — AB (ref 12.0–15.0)
MCH: 30.7 pg (ref 26.0–34.0)
MCHC: 32.4 g/dL (ref 30.0–36.0)
MCV: 94.6 fL (ref 78.0–100.0)
Platelets: 247 10*3/uL (ref 150–400)
RBC: 3.52 MIL/uL — AB (ref 3.87–5.11)
RDW: 14 % (ref 11.5–15.5)
WBC: 4.5 10*3/uL (ref 4.0–10.5)

## 2013-04-21 LAB — BASIC METABOLIC PANEL
BUN: 99 mg/dL — AB (ref 6–23)
CO2: 15 meq/L — AB (ref 19–32)
CREATININE: 2.15 mg/dL — AB (ref 0.50–1.10)
Calcium: 8.5 mg/dL (ref 8.4–10.5)
Chloride: 112 mEq/L (ref 96–112)
GFR calc Af Amer: 23 mL/min — ABNORMAL LOW (ref >90)
GFR calc non Af Amer: 20 mL/min — ABNORMAL LOW (ref >90)
GLUCOSE: 79 mg/dL (ref 70–99)
Potassium: 5.6 mEq/L — ABNORMAL HIGH (ref 3.7–5.3)
Sodium: 144 mEq/L (ref 137–147)

## 2013-04-21 LAB — MRSA PCR SCREENING: MRSA BY PCR: POSITIVE — AB

## 2013-04-21 MED ORDER — SODIUM CHLORIDE 0.9 % IV BOLUS (SEPSIS)
1000.0000 mL | Freq: Once | INTRAVENOUS | Status: AC
  Administered 2013-04-21: 1000 mL via INTRAVENOUS

## 2013-04-21 MED ORDER — SODIUM CHLORIDE 0.9 % IV SOLN
0.5000 mg/h | INTRAVENOUS | Status: DC
  Administered 2013-04-21: 0.5 mg/h via INTRAVENOUS
  Filled 2013-04-21: qty 2.5

## 2013-04-21 MED ORDER — MUPIROCIN 2 % EX OINT
1.0000 "application " | TOPICAL_OINTMENT | Freq: Two times a day (BID) | CUTANEOUS | Status: DC
  Administered 2013-04-21 (×2): 1 via NASAL
  Filled 2013-04-21: qty 22

## 2013-04-21 MED ORDER — PIPERACILLIN-TAZOBACTAM IN DEX 2-0.25 GM/50ML IV SOLN
2.2500 g | Freq: Four times a day (QID) | INTRAVENOUS | Status: DC
  Administered 2013-04-21 – 2013-04-22 (×4): 2.25 g via INTRAVENOUS
  Filled 2013-04-21 (×6): qty 50

## 2013-04-21 MED ORDER — HYDROMORPHONE HCL PF 1 MG/ML IJ SOLN
1.0000 mg | INTRAMUSCULAR | Status: DC | PRN
Start: 2013-04-21 — End: 2013-04-22

## 2013-04-21 MED ORDER — VANCOMYCIN HCL 10 G IV SOLR
1250.0000 mg | Freq: Once | INTRAVENOUS | Status: AC
  Administered 2013-04-21: 1250 mg via INTRAVENOUS
  Filled 2013-04-21: qty 1250

## 2013-04-21 MED ORDER — SODIUM CHLORIDE 0.9 % IV BOLUS (SEPSIS)
500.0000 mL | Freq: Once | INTRAVENOUS | Status: AC
  Administered 2013-04-21: 500 mL via INTRAVENOUS

## 2013-04-21 MED ORDER — VANCOMYCIN HCL IN DEXTROSE 1-5 GM/200ML-% IV SOLN: 1000.0000 mg | INTRAVENOUS | Status: DC

## 2013-04-21 MED ORDER — CHLORHEXIDINE GLUCONATE CLOTH 2 % EX PADS: 6.0000 | MEDICATED_PAD | Freq: Every day | CUTANEOUS | Status: DC

## 2013-04-21 MED ORDER — HYDROMORPHONE HCL PF 1 MG/ML IJ SOLN
1.0000 mg | INTRAMUSCULAR | Status: DC | PRN
  Administered 2013-04-21: 0.5 mg via INTRAVENOUS
  Filled 2013-04-21 (×2): qty 1

## 2013-04-21 MED ORDER — HYDROMORPHONE HCL PF 1 MG/ML IJ SOLN: 0.5000 mg | INTRAMUSCULAR | Status: DC | PRN

## 2013-04-21 MED ORDER — HYDROMORPHONE HCL PF 1 MG/ML IJ SOLN
1.0000 mg | INTRAMUSCULAR | Status: DC | PRN
  Administered 2013-04-21 (×2): 1 mg via INTRAVENOUS
  Filled 2013-04-21: qty 1

## 2013-04-21 NOTE — Progress Notes (Signed)
Seen, examined, and agree.  Pt and family desire DNR/DNI.    Even if SBO does not resolve, would not plan surgery based on high risk for post op ventilation and complications.    Suspect prognosis poor based on pulmonary issues, regardless of what happens with NGT and SBO.

## 2013-04-21 NOTE — Progress Notes (Signed)
Pt pulled NG tube out; stated she just didn't want it in anymore; seems a little disoriented from possible Dilaudid; able to answer questions appropriately; Attempted twice, unsuccessful; Notified Saverio Danker, [place]. Clarissa, RN able to successfully place 16 french NG tube; on LIS; mitten to bilateral hands at this time; Daughter in law at bedside and updated by nurse and physician.

## 2013-04-21 NOTE — Progress Notes (Signed)
[facility] PROGRESS NOTE  Katie Galvan IRW:[id number] DOB: [birth year] DOA: 05-17-13 PCP: Katie Space, MD  Assessment/Plan:  Patient is declining fast; hemodynamically unstable, in resp failure;  -d/w patient, family at the bedside; no agressive care; will cont supportive, comfort care   Katie Galvan  [facility] Pager [phone]. If 7PM-7AM, please contact night-coverage at [email or web address], password [facility] 04/21/2013, 4:15 PM  LOS: 2 days

## 2013-04-21 NOTE — Progress Notes (Signed)
Duplicate; see note filed at 10:46 am.

## 2013-04-21 NOTE — Consult Note (Signed)
Renal Service Consult Note [facility]  Katie Galvan 04/21/2013 [name] Requesting Physician:  Dr Daleen Bo  Reason for Consult:  Acute renal failure HPI: The patient is a 80 y.o. year-old with hx of HTN, endocarditis, systolic CHF (EF 92%), chronic pain syndrome, COPD and HL presented to ED on 04/08/2013 with abd pain, nausea, vomiting.  She has had prior sigmoid resection for diverticulitis and uses an ostomy bag.  W/U in ED showed SBO with adhesions. Pt was admitted for suspected SBO, chronic diast HF and acute azotemia felt to be prerenal.  NG tube was placed, ACEi and diuretics were stopped and pt was kept NPO. Creat was up on admission at 1.35.  IVF's were given but creat continued to worsen and is up to 2.15 today.  BP's were normal up until today and now are in the 70's - 90's range.  CXR has shown new RUL infiltrate and she is getting IV abx for HCAP. She has COPD and was on home O2 in the past. She does not provide any history, transferred to ICU this afternoon due to resp distress and hypotension  Home meds were asa, CaCO3, celexa, voltaren cream, Advair , lasix 20 mg per day, lisinopril 5/d, MTP 25 bid, MVE, protonix, aldactone 12.47m qam, spiriva and vitamins.  Chart review 2011 > wrist fracture after fall, HTN, depression, CHF, HL, chronic pain, DJD, afib > ORIF Dr KFredna Dow3/2012 > AKI, UTI, anemia, a/c CHF, NSVT, antral gastritis by EGD, chronic LE edema, chronic pain syndrome 04/2010 > DJD w R THA 06/2010 > acute on chronic resp failure, COPD, severe restrictive lung disease, HTN, diast HF 10/2010 > colitis, myocarditis/pericarditis, EF 30%, GERD 07/2011 > syncope, HTN, sinus brady, AMS, chronic resp failure 10/2101 > sigmoid diverticulitis, anemia, pancreatic mass, diast HF, falling 02/2012 > diverticulitis, UTI, diast HF, panc mass, anemia, left leg wound, mitral regurg, postop ileus  Date   Creat  eGFR July 2014  0.08 Oct 2012  0.9   April 19, 2013  1.35  35 April 20, 2013  1.69  27 April 21, 2013  2.15  20  ROS  not present  Past Medical History  Past Medical History  Diagnosis Date  . Disorders of diaphragm     right diaphragm elevation  . Unspecified essential hypertension   . Endocarditis, valve unspecified, unspecified cause   . Cardiac dysrhythmia, unspecified   . Unspecified venous (peripheral) insufficiency   . Edema   . Pure hypercholesterolemia   . Other dysphagia   . Diverticulosis of colon (without mention of hemorrhage)   . Benign neoplasm of colon   . Osteoarthrosis, unspecified whether generalized or localized, unspecified site   . Lumbago   . Chronic pain syndrome   . Disorder of bone and cartilage, unspecified   . Depressive disorder, not elsewhere classified   . Anemia, unspecified   . Skin cancer   . Systolic heart failure     echo 10/10/10 EF 30%  . Hiatal hernia   . Gastritis   . COPD (chronic obstructive pulmonary disease)   . Lower extremity ulceration     dressing changes daily  . Gait disorder   . Chronic low back pain    Past Surgical History  Past Surgical History  Procedure Laterality Date  . Appendectomy    . Breast surgery      biopsy  . Left wrist surgery    . Bilateral cataracts    . Hip surgery  dr. Rush Farmer, twice because the bones were spongy  . Tonsillectomy    . Adenoidectomy    . Colostomy revision N/A 03/14/2012    Procedure: COLON RESECTION SIGMOID;  Surgeon: Merrie Roof, MD;  Location: [facility];  Service: General;  Laterality: N/A;  left colectomy with mobilization of splenic flexure  . Colostomy N/A 03/14/2012    Procedure: COLOSTOMY;  Surgeon: Merrie Roof, MD;  Location: [facility];  Service: General;  Laterality: N/A;   Family History  Family History  Problem Relation Age of Onset  . Diabetes Mother   . Heart disease Father   . Lung cancer Sister   . Alcohol abuse Sister   . Alcohol abuse Brother    Social History  reports that she has never  smoked. She has never used smokeless tobacco. She reports that she does not drink alcohol or use illicit drugs. Allergies  Allergies  Allergen Reactions  . Sulfonamide Derivatives Swelling   Home medications Prior to Admission medications   Medication Sig Start Date End Date Taking? Authorizing Provider  aspirin 81 MG EC tablet Take 81 mg by mouth every morning.    Yes Historical Provider, MD  Calcium Carbonate-Vit D-Min (CALTRATE 600+D PLUS) 600-400 MG-UNIT per tablet Chew 1 tablet by mouth 2 (two) times daily.    Yes Historical Provider, MD  cholecalciferol (VITAMIN D) 1000 UNITS tablet Take 2,000 Units by mouth every morning.    Yes Historical Provider, MD  citalopram (CELEXA) 20 MG tablet Take 20 mg by mouth daily.   Yes Historical Provider, MD  clotrimazole-betamethasone (LOTRISONE) cream Apply topically 2 (two) times daily. 04/17/12  Yes Noralee Space, MD  diclofenac sodium (VOLTAREN) 1 % GEL Apply to knee's two times daily   Yes Historical Provider, MD  ferrous sulfate 325 (65 FE) MG tablet Take 325 mg by mouth daily with breakfast.    Yes Historical Provider, MD  Fluticasone-Salmeterol (ADVAIR DISKUS) 100-50 MCG/DOSE AEPB Inhale 1 puff into the lungs 2 (two) times daily. 12/24/12  Yes Noralee Space, MD  furosemide (LASIX) 40 MG tablet Take 0.5 tablets (20 mg total) by mouth daily. 05/22/12  Yes Noralee Space, MD  lisinopril (PRINIVIL,ZESTRIL) 5 MG tablet Take 5 mg by mouth daily.   Yes Historical Provider, MD  loratadine (CLARITIN) 10 MG tablet Take 10 mg by mouth every morning.    Yes Historical Provider, MD  metoprolol tartrate (LOPRESSOR) 25 MG tablet Take 25 mg by mouth 2 (two) times daily.   Yes Historical Provider, MD  Multiple Vitamin (MULTIVITAMIN WITH MINERALS) TABS Take 1 tablet by mouth every morning.    Yes Historical Provider, MD  pantoprazole (PROTONIX) 40 MG tablet Take 1 tablet (40 mg total) by mouth daily. 10/28/12  Yes Noralee Space, MD  Probiotic Product (ALIGN) 4 MG  CAPS Take 1 capsule by mouth every morning.   Yes Historical Provider, MD  spironolactone (ALDACTONE) 25 MG tablet Take 0.5 tablets (12.5 mg total) by mouth every morning. 10/28/12  Yes Noralee Space, MD  tiotropium (SPIRIVA) 18 MCG inhalation capsule Place 1 capsule (18 mcg total) into inhaler and inhale daily. 09/05/12  Yes Noralee Space, MD  vitamin C (ASCORBIC ACID) 500 MG tablet Take 500 mg by mouth 2 (two) times daily.    Yes Historical Provider, MD  zinc gluconate 50 MG tablet Take 100 mg by mouth every morning.   Yes Historical Provider, MD   Liver Function Tests  Recent Labs Lab  04/15/2013 1740  AST 23  ALT 17  ALKPHOS 87  BILITOT 0.4  PROT 7.7  ALBUMIN 4.1    Recent Labs Lab 04/08/2013 1740  LIPASE 10*   CBC  Recent Labs Lab 04/09/2013 1740 04/20/13 0705 04/21/13 0506  WBC 19.7* 14.8* 4.5  NEUTROABS 16.8*  --   --   HGB 13.7 12.1 10.8*  HCT 41.3 38.5 33.3*  MCV 91.6 95.1 94.6  PLT 404* 342 594   Basic Metabolic Panel  Recent Labs Lab 04/18/2013 1740 04/20/13 0705 04/21/13 0506  NA 138 141 144  K 5.3 5.2 5.6*  CL 98 102 112  CO2 22 23 15*  GLUCOSE 196* 149* 79  BUN 86* 86* 99*  CREATININE 1.35* 1.69* 2.15*  CALCIUM 11.0* 9.7 8.5    Filed Vitals:   04/21/13 1125 04/21/13 1200 04/21/13 1230 04/21/13 1300  BP: 91/35 74/44 83/49  81/45  Pulse:  93 49   Temp: 98.2 F (36.8 C) 98.2 F (36.8 C)    TempSrc: Axillary Oral    Resp:  19 23   Height:      Weight:      SpO2:  93% 81%    Exam Elderly WF , FM oxygen, responds briefly, looks uncomfortable No rash, cyanosis or gangrene Sclera anicteric, throat clear Chest coarse BS bilat RRR no MRG Abd diffuse tenderness suspected, not distended, ostomy intact No LE or UE edema Neuro is confused, gen weakness  UA > negative on 4/18 CXR > marked elevation of R hemidiaphragm ECHO Mar 2014 > severe MR, LVEF 55-60%  Assessment: 1 Acute renal failure- likely due to septic shock 2 HCAP- right lung 3 SBO- NG  in place 4 Shock- prob sepsis 5 Chronic resp failure 6 Hx severe mitral valve regurg 7 HTN 8 Colostomy - for diverticulitis in 2014   Rec- supportive care, stop metoprolol, getting fluid bolus. Note pt is DNR. Prognosis is poor.  Not a good dialysis candidate. Will follow. No other recommendation at this time. Have d/w primary MD.  Kelly Splinter MD (pgr) [phone]    [phone] 04/21/2013, 1:46 PM

## 2013-04-21 NOTE — Consult Note (Signed)
Name: Katie Galvan MRN: [id number] DOB: Nov 21, 1927    ADMISSION DATE:  04/29/2013 CONSULTATION DATE:  4/20  REFERRING MD :  Maxwell Caul [facility] PRIMARY SERVICE:  [facility]   CHIEF COMPLAINT:  Acute on Chronic respiratory failure   BRIEF PATIENT DESCRIPTION:  This is a 80 year old female f/b SN w chronic respiratory failure in setting of chronically elevated right hemi-diaphragm and restrictive lung disease, further c/b diastolic dysfxn which has resulted in hospitalization in the past. Presented to ER from ALF on 4/18 w/ severe abd pain. CT c/w SBO. Course c/b multiple bouts of emesis. PCCM asked to see in consult on 4/20 for acute on chronic respiratory failure in setting of what appears to be aspiration pna.   SIGNIFICANT EVENTS / STUDIES:  CT abd 4/18: Mildly prominent proximal and mid small bowel loops with transition point in the mid small bowel, likely due to adhesions. Findings compatible with mild small bowel obstruction   LINES / TUBES:   CULTURES: BCX2 4/20>>>  ANTIBIOTICS: Zosyn 4/19>>> vanc 4/19>>>  HISTORY OF PRESENT ILLNESS:   This is a 80 year old female f/b SN w chronic respiratory failure in setting of chronically elevated right hemi-diaphragm and restrictive lung disease, further c/b diastolic dysfxn which has resulted in hospitalization in the past. Presented to ER from ALF on 4/18 w/ severe abd pain. CT c/w SBO. Course c/b multiple bouts of emesis. PCCM asked to see in consult on 4/20 for acute on chronic respiratory failure in setting of what appears to be aspiration pna.   PAST MEDICAL HISTORY :  Past Medical History  Diagnosis Date  . Disorders of diaphragm     right diaphragm elevation  . Unspecified essential hypertension   . Endocarditis, valve unspecified, unspecified cause   . Cardiac dysrhythmia, unspecified   . Unspecified venous (peripheral) insufficiency   . Edema   . Pure hypercholesterolemia   . Other dysphagia   . Diverticulosis of colon  (without mention of hemorrhage)   . Benign neoplasm of colon   . Osteoarthrosis, unspecified whether generalized or localized, unspecified site   . Lumbago   . Chronic pain syndrome   . Disorder of bone and cartilage, unspecified   . Depressive disorder, not elsewhere classified   . Anemia, unspecified   . Skin cancer   . Systolic heart failure     echo 10/10/10 EF 30%  . Hiatal hernia   . Gastritis   . COPD (chronic obstructive pulmonary disease)   . Lower extremity ulceration     dressing changes daily  . Gait disorder   . Chronic low back pain    Past Surgical History  Procedure Laterality Date  . Appendectomy    . Breast surgery      biopsy  . Left wrist surgery    . Bilateral cataracts    . Hip surgery      dr. Rush Farmer, twice because the bones were spongy  . Tonsillectomy    . Adenoidectomy    . Colostomy revision N/A 03/14/2012    Procedure: COLON RESECTION SIGMOID;  Surgeon: Merrie Roof, MD;  Location: [facility];  Service: General;  Laterality: N/A;  left colectomy with mobilization of splenic flexure  . Colostomy N/A 03/14/2012    Procedure: COLOSTOMY;  Surgeon: Merrie Roof, MD;  Location: [facility];  Service: General;  Laterality: N/A;   Prior to Admission medications   Medication Sig Start Date End Date Taking? Authorizing Provider  aspirin 81  MG EC tablet Take 81 mg by mouth every morning.    Yes Historical Provider, MD  Calcium Carbonate-Vit D-Min (CALTRATE 600+D PLUS) 600-400 MG-UNIT per tablet Chew 1 tablet by mouth 2 (two) times daily.    Yes Historical Provider, MD  cholecalciferol (VITAMIN D) 1000 UNITS tablet Take 2,000 Units by mouth every morning.    Yes Historical Provider, MD  citalopram (CELEXA) 20 MG tablet Take 20 mg by mouth daily.   Yes Historical Provider, MD  clotrimazole-betamethasone (LOTRISONE) cream Apply topically 2 (two) times daily. 04/17/12  Yes Noralee Space, MD  diclofenac sodium (VOLTAREN) 1 % GEL Apply to knee's two times daily   Yes  Historical Provider, MD  ferrous sulfate 325 (65 FE) MG tablet Take 325 mg by mouth daily with breakfast.    Yes Historical Provider, MD  Fluticasone-Salmeterol (ADVAIR DISKUS) 100-50 MCG/DOSE AEPB Inhale 1 puff into the lungs 2 (two) times daily. 12/24/12  Yes Noralee Space, MD  furosemide (LASIX) 40 MG tablet Take 0.5 tablets (20 mg total) by mouth daily. 05/22/12  Yes Noralee Space, MD  lisinopril (PRINIVIL,ZESTRIL) 5 MG tablet Take 5 mg by mouth daily.   Yes Historical Provider, MD  loratadine (CLARITIN) 10 MG tablet Take 10 mg by mouth every morning.    Yes Historical Provider, MD  metoprolol tartrate (LOPRESSOR) 25 MG tablet Take 25 mg by mouth 2 (two) times daily.   Yes Historical Provider, MD  Multiple Vitamin (MULTIVITAMIN WITH MINERALS) TABS Take 1 tablet by mouth every morning.    Yes Historical Provider, MD  pantoprazole (PROTONIX) 40 MG tablet Take 1 tablet (40 mg total) by mouth daily. 10/28/12  Yes Noralee Space, MD  Probiotic Product (ALIGN) 4 MG CAPS Take 1 capsule by mouth every morning.   Yes Historical Provider, MD  spironolactone (ALDACTONE) 25 MG tablet Take 0.5 tablets (12.5 mg total) by mouth every morning. 10/28/12  Yes Noralee Space, MD  tiotropium (SPIRIVA) 18 MCG inhalation capsule Place 1 capsule (18 mcg total) into inhaler and inhale daily. 09/05/12  Yes Noralee Space, MD  vitamin C (ASCORBIC ACID) 500 MG tablet Take 500 mg by mouth 2 (two) times daily.    Yes Historical Provider, MD  zinc gluconate 50 MG tablet Take 100 mg by mouth every morning.   Yes Historical Provider, MD   Allergies  Allergen Reactions  . Sulfonamide Derivatives Swelling    FAMILY HISTORY:  Family History  Problem Relation Age of Onset  . Diabetes Mother   . Heart disease Father   . Lung cancer Sister   . Alcohol abuse Sister   . Alcohol abuse Brother    SOCIAL HISTORY:  reports that she has never smoked. She has never used smokeless tobacco. She reports that she does not drink alcohol  or use illicit drugs.  REVIEW OF SYSTEMS:   Constitutional: Negative for fever, chills, weight loss, malaise/fatigue and diaphoresis.  HENT: Negative for hearing loss, ear pain, nosebleeds, congestion, sore throat, neck pain, tinnitus and ear discharge.   Eyes: Negative for blurred vision, double vision, photophobia, pain, discharge and redness.  Respiratory: Negative for cough, hemoptysis, sputum production, shortness of breath, wheezing and stridor.   Cardiovascular: Negative for chest pain, palpitations, orthopnea, claudication, leg swelling and PND.  Gastrointestinal: Negative for heartburn, nausea, vomiting, abdominal pain, diarrhea, constipation, blood in stool and melena.  Genitourinary: Negative for dysuria, urgency, frequency, hematuria and flank pain.  Musculoskeletal: Negative for myalgias, back pain, joint pain and  falls.  Skin: Negative for itching and rash.  Neurological: Negative for dizziness, tingling, tremors, sensory change, speech change, focal weakness, seizures, loss of consciousness, weakness and headaches.  Endo/Heme/Allergies: Negative for environmental allergies and polydipsia. Does not bruise/bleed easily.  SUBJECTIVE:  C/o sig abd discomfort  VITAL SIGNS: Temp:  [97.7 F (36.5 C)-98.1 F (36.7 C)] 98.1 F (36.7 C) (04/20 0806) Pulse Rate:  [60-89] 60 (04/20 0806) Resp:  [12-18] 18 (04/20 0806) BP: (82-134)/(49-74) 92/62 mmHg (04/20 0806) SpO2:  [85 %-95 %] 88 % (04/20 0806) FiO2 (%):  [50 %] 50 % (04/19 2015)  PHYSICAL EXAMINATION: General:  Frail 80 year old female, now with increased resp effort Neuro:  Awake, alert, no focal def  HEENT:  [place], no JVD  Cardiovascular:  rrr Lungs:  Decreased on right  Abdomen:  Ostomy intact. Hypoactive bowel sounds. Tender to light palp  Musculoskeletal:  Intact  Skin:  Intact    Recent Labs Lab 04/26/2013 1740 04/20/13 0705 04/21/13 0506  NA 138 141 144  K 5.3 5.2 5.6*  CL 98 102 112  CO2 22 23 15*  BUN 86*  86* 99*  CREATININE 1.35* 1.69* 2.15*  GLUCOSE 196* 149* 79    Recent Labs Lab 04/21/2013 1740 04/20/13 0705 04/21/13 0506  HGB 13.7 12.1 10.8*  HCT 41.3 38.5 33.3*  WBC 19.7* 14.8* 4.5  PLT 404* 342 247   Ct Abdomen Pelvis Wo Contrast  04/12/2013   CLINICAL DATA:  Abdominal pain.  Prior colostomy 1 year ago.  EXAM: CT ABDOMEN AND PELVIS WITHOUT CONTRAST  TECHNIQUE: Multidetector CT imaging of the abdomen and pelvis was performed following the standard protocol without intravenous contrast.  COMPARISON:  Plain films 04/07/2013.  CT 07/17/2012  FINDINGS: Marked elevation of the right hemidiaphragm. No confluent opacity in the lung bases. Heart is normal size.  Unenhanced appearance of the liver, spleen, pancreas, adrenals is unremarkable. Cortical thinning within the kidneys bilaterally. Small exophytic lesion off the midpole of the right kidney is stable since prior study, likely small cyst. Nonobstructing stone in the lower pole of the left kidney measures 6 mm. No hydronephrosis. No ureteral stones.  Uterus, adnexae and urinary bladder are unremarkable.  Left lower quadrant ostomy is noted. There are mildly prominent mid abdominal small bowel loops with scattered air-fluid levels. Transition point noted on image 47 in the midline of the upper pelvis were there appears to be kinking of the bowel, presumably related to adhesions. Distal small bowel is decompressed.  Aorta and iliac vessels are heavily calcified. Trace free fluid in the pelvis.  IMPRESSION: Mildly prominent proximal and mid small bowel loops with transition point in the mid small bowel, likely due to adhesions. Findings compatible with mild small bowel obstruction.  Left lower quadrant ostomy noted, grossly unremarkable.  Small amount of free fluid in the pelvis.  Marked elevation of the right hemidiaphragm, stable.   Electronically Signed   By: Rolm Baptise M.D.   On: 04/02/2013 22:28   Dg Chest 1 View  04/21/2013   CLINICAL DATA:   Hypoxia.  EXAM: CHEST - 1 VIEW  COMPARISON:  CT ABD/PELV WO CM dated 04/29/2013; DG CHEST 1V PORT dated 03/05/2012  FINDINGS: Patient is rotated to the right. This results in mild mediastinal prominence. Persistent elevation right hemidiaphragm is noted. Noted however on today's examination it is an infiltrate in the right upper lobe consistent with pneumonia. There is a probable right pleural effusion. Followup PA and lateral chest x-rays recommended to  demonstrate clearing. Left lung is clear. No pneumothorax. No acute osseous abnormality.  IMPRESSION: 1. New right upper lobe infiltrate consistent with pneumonia. Associated right pleural effusion. Atelectatic changes are also present right upper lobe. 2. Persistent elevation right hemidiaphragm.   Electronically Signed   By: Marcello Moores  Register   On: 04/21/2013 09:13   Dg Abd Acute W/chest  04/14/2013   CLINICAL DATA:  Generalized abdominal pain.  EXAM: ACUTE ABDOMEN SERIES (ABDOMEN 2 VIEW & CHEST 1 VIEW)  COMPARISON:  Chest x-ray 03/05/2012.  Abdominal imaging 03/02/2012.  FINDINGS: Stable marked elevation of the right hemidiaphragm. No confluent opacity in the lungs. Heart is normal size. No effusions.  Moderate stool burden throughout the colon. Nonobstructive bowel gas pattern. No organomegaly. There is a calcification which projects medial to the left kidney measuring 5 mm. There was a calcification within the lower pole of the left kidney previously. This could reflect migration of this stone into the left renal pelvis or proximal ureter. Recommend clinical correlation for flank pain and hematuria. Calcified phleboliths in the pelvis.  Severe rightward scoliosis in the lumbar spine with degenerative changes. Right hip replacement changes noted.  IMPRESSION: Questionable proximal left ureteral stone. Recommend clinical correlation for hematuria and flank pain.  Large stool burden throughout the colon. No evidence of bowel obstruction.  Stable marked elevation of  the right.   Electronically Signed   By: Rolm Baptise M.D.   On: 04/05/2013 18:44    ASSESSMENT / PLAN:  Acute on chronic respiratory failure in setting of aspiration pneumonia super-imposed on underlying restrictive lung disease from chronically elevated right hemidiaphragm (per chart review idiopathic) Diastolic dysfunction Mild secondary PAH Small bowel obstruction: likely due to adhesions in setting of prior abd surgeries.  Acute renal failure  + anion gap metabolic acidosis Physical deconditioning   Discussion   She now has NGT placed. She is not a candidate for NIPPV given risk of further aspiration.Given her history would be very high risk that she might not come off vent if intubated. Have spoken w/ surgery. They do not feel given her respiratory status she is a good operative candidate.    Recommendation  -agree gastric decompression and conservative measures are the most important intervention here -she is NOT a candidate for NIPPV given further aspiration risk -if she were to worsen from pulm stand-point would be high risk of prolonged ventilator dependence and failure to wean. Therefore would avoid intubation (she is already DNR) -agree w/ empiric abx -hold antihypertensives -agree w/ IVFs -careful with narcotics.  -if she continues to worsen clinically may need to transition care to comfort oriented interventions only. Seems like this is going to declare itself in the next 24-48 hrs.  - we will be available, but nothing to offer above what the IM team is already offering. We will s/o  Marni Griffon ACNP-BC [place] Pager # [phone] OR # [phone] if no answer    04/21/2013, 10:51 AM  Patient clearly aspirated with SBO.  She would make a very poor surgical candidate.  She is clearly in respiratory failure and with previous history her chances of coming off the ventilator are very poor.  Thus I full support the DNR request made by patient prior.  I  also would support that the patient is not a surgical candidate after speaking with [facility].  Patient is also not a candidate for BiPAP given aspiration risk.  If patient is to deteriorate further then recommend comfort care.  However, in  the meantime, NGT is in place and will keep on suction and hopefully the SBO will resolve in a non-surgical manner.    CC time 35 minutes.  Patient seen and examined, agree with above note.  I dictated the care and orders written for this patient under my direction.  Rush Farmer, MD [phone]

## 2013-04-21 NOTE — Progress Notes (Addendum)
[facility] PROGRESS NOTE  Katie Galvan AYT:[id number] DOB: 10/14/1927 DOA: 04/24/2013 PCP: Noralee Space, MD  Assessment/Plan: Principal Problem:  SBO (small bowel obstruction)  Active Problems:  Diastolic CHF, chronic  Acute prerenal azotemia  80 y/o female with PMH of HTN, CHF, COPD, anemia, s/p hartmann's procedure last fall by Dr. Marlou Starks for persistent diverticulitis presented with SBO, complicated with HCAP/sepsis     1. SBO; CT abd: Mildly prominent proximal and mid small bowel loops with transition point in the mid small bowel, likely due to adhesions. Findings compatible with mild small bowel obstruction. -not significant improvement; NPO, IVF; defer management to surgery; appreciate the input  2.  AKI hyper K likely due to ATN+dehydration/prerenal with diuretics/ACE -Cr worse; increase IVF; monitor labs, I/O; hold diuretics/ACE; c/s nephrology    3. HCAP/sepsis; CXR (4/20): New right upper lobe infiltrate consistent with pneumonia -started IV atx; obtain blood cultures; cont inhalers, may need NiPPV;    4. COPD with acute hypoxic resp failure due to pneumonia; patient was on home oxygen in the past; -chronically elevated R hemidiaphragm;   -cont inhaler, oxygen, IV atx, decrease opioids, close monitor; c/s pulmonology per family request   5. Chronic CHF with pulmonary HTN; echo (2014): LVEF 55-60%, PA peak pressure: 41mm Hg  -no s/s of fluid overload on exam; hold diuretics while on IVF   TF to SDU close monitor   Code Status: DNR Family Communication: d/w patient, her granddaughter; updated [place], [place] [phone]  (indicate person spoken with, relationship, and if by phone, the number) Disposition Plan: home pend clinical improvement    Consultants:  surgery  Procedures:  none  Antibiotics:  none (indicate start date, and stop date if known)  HPI/Subjective: alert  Objective: Filed Vitals:   04/21/13 0806  BP: 92/62   Pulse: 60  Temp: 98.1 F (36.7 C)  Resp: 18    Intake/Output Summary (Last 24 hours) at 04/21/13 0837 Last data filed at 04/21/13 0600  Gross per 24 hour  Intake 3531.25 ml  Output    751 ml  Net 2780.25 ml   Filed Weights   04/20/13 0050  Weight: 62.596 kg (138 lb)    Exam:   General:  alert  Cardiovascular: s1,s2 rrr  Respiratory: diminished in LL  Abdomen: mild diffuse tender, +ostomy no output   Musculoskeletal: no LE edema   Data Reviewed: Basic Metabolic Panel:  Recent Labs Lab 04/16/2013 1740 04/20/13 0705 04/21/13 0506  NA 138 141 144  K 5.3 5.2 5.6*  CL 98 102 112  CO2 22 23 15*  GLUCOSE 196* 149* 79  BUN 86* 86* 99*  CREATININE 1.35* 1.69* 2.15*  CALCIUM 11.0* 9.7 8.5   Liver Function Tests:  Recent Labs Lab 04/30/2013 1740  AST 23  ALT 17  ALKPHOS 87  BILITOT 0.4  PROT 7.7  ALBUMIN 4.1    Recent Labs Lab 04/29/2013 1740  LIPASE 10*   No results found for this basename: AMMONIA,  in the last 168 hours CBC:  Recent Labs Lab 04/27/2013 1740 04/20/13 0705 04/21/13 0506  WBC 19.7* 14.8* 4.5  NEUTROABS 16.8*  --   --   HGB 13.7 12.1 10.8*  HCT 41.3 38.5 33.3*  MCV 91.6 95.1 94.6  PLT 404* 342 247   Cardiac Enzymes: No results found for this basename: CKTOTAL, CKMB, CKMBINDEX, TROPONINI,  in the last 168 hours BNP (last 3 results)  Recent Labs  10/28/12 1730  PROBNP 162.0*   CBG: No  results found for this basename: GLUCAP,  in the last 168 hours  No results found for this or any previous visit (from the past 240 hour(s)).   Studies: Ct Abdomen Pelvis Wo Contrast  04/10/2013   CLINICAL DATA:  Abdominal pain.  Prior colostomy 1 year ago.  EXAM: CT ABDOMEN AND PELVIS WITHOUT CONTRAST  TECHNIQUE: Multidetector CT imaging of the abdomen and pelvis was performed following the standard protocol without intravenous contrast.  COMPARISON:  Plain films 04/07/2013.  CT 07/17/2012  FINDINGS: Marked elevation of the right  hemidiaphragm. No confluent opacity in the lung bases. Heart is normal size.  Unenhanced appearance of the liver, spleen, pancreas, adrenals is unremarkable. Cortical thinning within the kidneys bilaterally. Small exophytic lesion off the midpole of the right kidney is stable since prior study, likely small cyst. Nonobstructing stone in the lower pole of the left kidney measures 6 mm. No hydronephrosis. No ureteral stones.  Uterus, adnexae and urinary bladder are unremarkable.  Left lower quadrant ostomy is noted. There are mildly prominent mid abdominal small bowel loops with scattered air-fluid levels. Transition point noted on image 47 in the midline of the upper pelvis were there appears to be kinking of the bowel, presumably related to adhesions. Distal small bowel is decompressed.  Aorta and iliac vessels are heavily calcified. Trace free fluid in the pelvis.  IMPRESSION: Mildly prominent proximal and mid small bowel loops with transition point in the mid small bowel, likely due to adhesions. Findings compatible with mild small bowel obstruction.  Left lower quadrant ostomy noted, grossly unremarkable.  Small amount of free fluid in the pelvis.  Marked elevation of the right hemidiaphragm, stable.   Electronically Signed   By: Rolm Baptise M.D.   On: 04/21/2013 22:28   Dg Abd Acute W/chest  05/01/2013   CLINICAL DATA:  Generalized abdominal pain.  EXAM: ACUTE ABDOMEN SERIES (ABDOMEN 2 VIEW & CHEST 1 VIEW)  COMPARISON:  Chest x-ray 03/05/2012.  Abdominal imaging 03/02/2012.  FINDINGS: Stable marked elevation of the right hemidiaphragm. No confluent opacity in the lungs. Heart is normal size. No effusions.  Moderate stool burden throughout the colon. Nonobstructive bowel gas pattern. No organomegaly. There is a calcification which projects medial to the left kidney measuring 5 mm. There was a calcification within the lower pole of the left kidney previously. This could reflect migration of this stone into the  left renal pelvis or proximal ureter. Recommend clinical correlation for flank pain and hematuria. Calcified phleboliths in the pelvis.  Severe rightward scoliosis in the lumbar spine with degenerative changes. Right hip replacement changes noted.  IMPRESSION: Questionable proximal left ureteral stone. Recommend clinical correlation for hematuria and flank pain.  Large stool burden throughout the colon. No evidence of bowel obstruction.  Stable marked elevation of the right.   Electronically Signed   By: Rolm Baptise M.D.   On: 04/18/2013 18:44    Scheduled Meds: . antiseptic oral rinse  15 mL Mouth Rinse q12n4p  . aspirin EC  81 mg Oral q morning - 10a  . chlorhexidine  15 mL Mouth Rinse BID  . citalopram  20 mg Oral Daily  . clotrimazole   Topical BID  . ferrous sulfate  325 mg Oral Q breakfast  . heparin  5,000 Units Subcutaneous 3 times per day  . loratadine  10 mg Oral q morning - 10a  . metoprolol tartrate  25 mg Oral BID  . mometasone-formoterol  2 puff Inhalation BID  . pantoprazole  40 mg Oral Daily  . tiotropium  18 mcg Inhalation Daily   Continuous Infusions: . sodium chloride 125 mL/hr at 04/21/13 5003    Principal Problem:   SBO (small bowel obstruction) Active Problems:   Diastolic CHF, chronic   Acute prerenal azotemia    Time spent: >35 minutes     [name]  [facility] Pager [phone]. If 7PM-7AM, please contact night-coverage at [email or web address], password [facility] 04/21/2013, 8:37 AM  LOS: 2 days

## 2013-04-21 NOTE — Progress Notes (Signed)
ANTIBIOTIC CONSULT NOTE - INITIAL  Pharmacy Consult for Zosyn, vancomycin Indication: HCAP  Allergies  Allergen Reactions  . Sulfonamide Derivatives Swelling    Patient Measurements: Height: 5\' 3"  (160 cm) Weight: 138 lb (62.596 kg) IBW/kg (Calculated) : 52.4  Vital Signs: Temp: 98.1 F (36.7 C) (04/20 0806) Temp src: Axillary (04/20 0806) BP: 92/62 mmHg (04/20 0806) Pulse Rate: 60 (04/20 0806) Intake/Output from previous day: 04/19 0701 - 04/20 0700 In: 3531.3 [I.V.:3031.3] Out: 751 [Urine:751] Intake/Output from this shift:    Labs:  Recent Labs  04/06/2013 1740 04/20/13 0705 04/21/13 0506  WBC 19.7* 14.8* 4.5  HGB 13.7 12.1 10.8*  PLT 404* 342 247  CREATININE 1.35* 1.69* 2.15*   Estimated Creatinine Clearance: 16.4 ml/min (by C-G formula based on Cr of 2.15). No results found for this basename: VANCOTROUGH, VANCOPEAK, VANCORANDOM, GENTTROUGH, GENTPEAK, GENTRANDOM, TOBRATROUGH, TOBRAPEAK, TOBRARND, AMIKACINPEAK, AMIKACINTROU, AMIKACIN,  in the last 72 hours   Microbiology: No results found for this or any previous visit (from the past 720 hour(s)).  Medical History: Past Medical History  Diagnosis Date  . Disorders of diaphragm     right diaphragm elevation  . Unspecified essential hypertension   . Endocarditis, valve unspecified, unspecified cause   . Cardiac dysrhythmia, unspecified   . Unspecified venous (peripheral) insufficiency   . Edema   . Pure hypercholesterolemia   . Other dysphagia   . Diverticulosis of colon (without mention of hemorrhage)   . Benign neoplasm of colon   . Osteoarthrosis, unspecified whether generalized or localized, unspecified site   . Lumbago   . Chronic pain syndrome   . Disorder of bone and cartilage, unspecified   . Depressive disorder, not elsewhere classified   . Anemia, unspecified   . Skin cancer   . Systolic heart failure     echo 10/10/10 EF 30%  . Hiatal hernia   . Gastritis   . COPD (chronic obstructive  pulmonary disease)   . Lower extremity ulceration     dressing changes daily  . Gait disorder   . Chronic low back pain     Medications:  Scheduled:  . antiseptic oral rinse  15 mL Mouth Rinse q12n4p  . aspirin EC  81 mg Oral q morning - 10a  . chlorhexidine  15 mL Mouth Rinse BID  . citalopram  20 mg Oral Daily  . clotrimazole   Topical BID  . ferrous sulfate  325 mg Oral Q breakfast  . heparin  5,000 Units Subcutaneous 3 times per day  . loratadine  10 mg Oral q morning - 10a  . metoprolol tartrate  25 mg Oral BID  . mometasone-formoterol  2 puff Inhalation BID  . pantoprazole  40 mg Oral Daily  . piperacillin-tazobactam (ZOSYN)  IV  2.25 g Intravenous Q6H  . sodium chloride  500 mL Intravenous Once  . tiotropium  18 mcg Inhalation Daily  . vancomycin  1,250 mg Intravenous Once  . [START ON 04/02/2013] vancomycin  1,000 mg Intravenous Q48H   Infusions:  . sodium chloride 125 mL/hr at 04/21/13 0336   PRN: albuterol, HYDROmorphone (DILAUDID) injection, ondansetron (ZOFRAN) IV  Assessment: 80 y/o F admitted with persistent diverticulitis and SBO, now with new RUL infiltrate consistent with pneumonia.  Also has AKI thought possibly related to dehydration and ACEI use.   Orders received to begin empiric Zosyn and vancomycin with pharmacy dosing assistance.  Blood cultures x2 are pending.  Goal of Therapy:  Appropriate antibiotic dosing for renal function; eradication  of infection. Vancomycin trough 15-20   Plan:  1. Zosyn 2.25 grams IV q6h (adjusted for estimated CrCl < 20 mL/min) 2. Vancomycin 1250 mg IV x 1, then 1000 mg IV q48h (adjusted for estimated CrCl 15-20 mL/min. 3. Follow serum creatinine and clinical course. Check vancomycin level prior to steady-state to prevent accumulation of excessive amounts in setting of AKI. 4. F/U on blood culture results.  Clayburn Pert, PharmD, BCPS Pager: [phone] 04/21/2013  10:46 AM

## 2013-04-21 NOTE — Progress Notes (Signed)
Patient ID: Katie Galvan, female   DOB: 1927/08/18, 80 y.o.   MRN: [id number]    Subjective: Pt c/o SOB and continues to be nauseated.  No flatus.  Upon entry into room patient is on a NRB with O2 sats in low 70s.    Objective: Vital signs in last 24 hours: Temp:  [97.7 F (36.5 C)-98.1 F (36.7 C)] 98.1 F (36.7 C) (04/20 0806) Pulse Rate:  [60-89] 60 (04/20 0806) Resp:  [12-18] 18 (04/20 0806) BP: (82-134)/(49-74) 92/62 mmHg (04/20 0806) SpO2:  [85 %-95 %] 88 % (04/20 0806) FiO2 (%):  [50 %] 50 % (04/19 2015) Last BM Date: 04/12/2013  Intake/Output from previous day: 04/19 0701 - 04/20 0700 In: 3531.3 [I.V.:3031.3] Out: 751 [Urine:751] Intake/Output this shift:    PE: Abd: soft, minimally tender, ostomy with no output, few BS Lungs: Diffuse rhonchi, using some accessory muscles to assist with ispiration.  NRB in place sats between 74-92%.  Lab Results:   Recent Labs  04/20/13 0705 04/21/13 0506  WBC 14.8* 4.5  HGB 12.1 10.8*  HCT 38.5 33.3*  PLT 342 247   BMET  Recent Labs  04/20/13 0705 04/21/13 0506  NA 141 144  K 5.2 5.6*  CL 102 112  CO2 23 15*  GLUCOSE 149* 79  BUN 86* 99*  CREATININE 1.69* 2.15*  CALCIUM 9.7 8.5   PT/INR No results found for this basename: LABPROT, INR,  in the last 72 hours CMP     Component Value Date/Time   NA 144 04/21/2013 0506   NA 139 11/27/2011 1533   K 5.6* 04/21/2013 0506   K 4.1 11/27/2011 1533   CL 112 04/21/2013 0506   CL 100 11/27/2011 1533   CO2 15* 04/21/2013 0506   CO2 31* 11/27/2011 1533   GLUCOSE 79 04/21/2013 0506   GLUCOSE 97 11/27/2011 1533   BUN 99* 04/21/2013 0506   BUN 22.0 11/27/2011 1533   CREATININE 2.15* 04/21/2013 0506   CREATININE 0.9 11/27/2011 1533   CALCIUM 8.5 04/21/2013 0506   CALCIUM 10.0 11/27/2011 1533   PROT 7.7 04/08/2013 1740   PROT 7.1 11/27/2011 1533   ALBUMIN 4.1 04/21/2013 1740   ALBUMIN 3.4* 11/27/2011 1533   AST 23 04/09/2013 1740   AST 16 11/27/2011 1533   ALT 17  04/04/2013 1740   ALT 11 11/27/2011 1533   ALKPHOS 87 04/12/2013 1740   ALKPHOS 73 11/27/2011 1533   BILITOT 0.4 04/20/2013 1740   BILITOT 0.34 11/27/2011 1533   GFRNONAA 20* 04/21/2013 0506   GFRAA 23* 04/21/2013 0506   Lipase     Component Value Date/Time   LIPASE 10* 04/16/2013 1740       Studies/Results: Ct Abdomen Pelvis Wo Contrast  04/09/2013   CLINICAL DATA:  Abdominal pain.  Prior colostomy 1 year ago.  EXAM: CT ABDOMEN AND PELVIS WITHOUT CONTRAST  TECHNIQUE: Multidetector CT imaging of the abdomen and pelvis was performed following the standard protocol without intravenous contrast.  COMPARISON:  Plain films 04/08/2013.  CT 07/17/2012  FINDINGS: Marked elevation of the right hemidiaphragm. No confluent opacity in the lung bases. Heart is normal size.  Unenhanced appearance of the liver, spleen, pancreas, adrenals is unremarkable. Cortical thinning within the kidneys bilaterally. Small exophytic lesion off the midpole of the right kidney is stable since prior study, likely small cyst. Nonobstructing stone in the lower pole of the left kidney measures 6 mm. No hydronephrosis. No ureteral stones.  Uterus, adnexae and urinary bladder are unremarkable.  Left lower quadrant ostomy is noted. There are mildly prominent mid abdominal small bowel loops with scattered air-fluid levels. Transition point noted on image 47 in the midline of the upper pelvis were there appears to be kinking of the bowel, presumably related to adhesions. Distal small bowel is decompressed.  Aorta and iliac vessels are heavily calcified. Trace free fluid in the pelvis.  IMPRESSION: Mildly prominent proximal and mid small bowel loops with transition point in the mid small bowel, likely due to adhesions. Findings compatible with mild small bowel obstruction.  Left lower quadrant ostomy noted, grossly unremarkable.  Small amount of free fluid in the pelvis.  Marked elevation of the right hemidiaphragm, stable.   Electronically  Signed   By: Rolm Baptise M.D.   On: 04/26/2013 22:28   Dg Chest 1 View  04/21/2013   CLINICAL DATA:  Hypoxia.  EXAM: CHEST - 1 VIEW  COMPARISON:  CT ABD/PELV WO CM dated 04/18/2013; DG CHEST 1V PORT dated 03/05/2012  FINDINGS: Patient is rotated to the right. This results in mild mediastinal prominence. Persistent elevation right hemidiaphragm is noted. Noted however on today's examination it is an infiltrate in the right upper lobe consistent with pneumonia. There is a probable right pleural effusion. Followup PA and lateral chest x-rays recommended to demonstrate clearing. Left lung is clear. No pneumothorax. No acute osseous abnormality.  IMPRESSION: 1. New right upper lobe infiltrate consistent with pneumonia. Associated right pleural effusion. Atelectatic changes are also present right upper lobe. 2. Persistent elevation right hemidiaphragm.   Electronically Signed   By: Marcello Moores  Register   On: 04/21/2013 09:13   Dg Abd Acute W/chest  04/09/2013   CLINICAL DATA:  Generalized abdominal pain.  EXAM: ACUTE ABDOMEN SERIES (ABDOMEN 2 VIEW & CHEST 1 VIEW)  COMPARISON:  Chest x-ray 03/05/2012.  Abdominal imaging 03/02/2012.  FINDINGS: Stable marked elevation of the right hemidiaphragm. No confluent opacity in the lungs. Heart is normal size. No effusions.  Moderate stool burden throughout the colon. Nonobstructive bowel gas pattern. No organomegaly. There is a calcification which projects medial to the left kidney measuring 5 mm. There was a calcification within the lower pole of the left kidney previously. This could reflect migration of this stone into the left renal pelvis or proximal ureter. Recommend clinical correlation for flank pain and hematuria. Calcified phleboliths in the pelvis.  Severe rightward scoliosis in the lumbar spine with degenerative changes. Right hip replacement changes noted.  IMPRESSION: Questionable proximal left ureteral stone. Recommend clinical correlation for hematuria and flank  pain.  Large stool burden throughout the colon. No evidence of bowel obstruction.  Stable marked elevation of the right.   Electronically Signed   By: Rolm Baptise M.D.   On: 05/01/2013 18:44    Anti-infectives: Anti-infectives   None       Assessment/Plan  1. SBO 2. Respiratory distress, likely secondary to PNA, which is likely aspiration from multiple bouts of emesis. 3. S/p Hartman's last year  Plan: 1. I am trying to contact the hospitalist to discuss this patient.  She likely will need to be moved to at least the SDU given her current respiratory status. 2. We will attempt NGT placement again to help with SBO and to help prevent further aspiration. 3. Follow up films today. 4. Follow closely.   LOS: 2 days    Henreitta Cea 04/21/2013, 9:35 AM Pager: [phone]

## 2013-04-21 NOTE — Significant Event (Signed)
Rapid Response Event Note  Overview: Time Called: 1940 Arrival Time: 1945 Event Type: Respiratory  Initial Focused Assessment: Pt had received dilaudid 1mg  for pain and was difficult to arouse with sats decreasing to 83 %. Upon arrival sats 96-100% on NRB and pt awake but very confused to events. She had received 0.2mg  narcan and was in pain guarding her abdomen.    Interventions: NRB and weaned to 50%vm.    Event Summary: Name of Physician Notified: Kathline Magic NP at 2000    at    Outcome: Stayed in room and stabalized  Event End Time: 2030  Pricilla Riffle

## 2013-04-21 NOTE — Progress Notes (Signed)
Pt c/o abdominal pain; received 0.5mg  of Dilaudid; upon re-entering room patients sats were in the 80s and she had pulled her mask off; she is alert and oriented and easily re-directed; blood pressure hypotensive at 74/44; notified Dr. Daleen Bo who ordered 1liter saline bolus; bolus being administered at this time; blood pressure check q30mins

## 2013-04-22 LAB — CBC
HEMATOCRIT: 34.5 % — AB (ref 36.0–46.0)
HEMOGLOBIN: 10.4 g/dL — AB (ref 12.0–15.0)
MCH: 30.1 pg (ref 26.0–34.0)
MCHC: 30.1 g/dL (ref 30.0–36.0)
MCV: 100 fL (ref 78.0–100.0)
Platelets: 167 10*3/uL (ref 150–400)
RBC: 3.45 MIL/uL — AB (ref 3.87–5.11)
RDW: 14.3 % (ref 11.5–15.5)
WBC: 13.3 10*3/uL — ABNORMAL HIGH (ref 4.0–10.5)

## 2013-04-22 LAB — BASIC METABOLIC PANEL
BUN: 99 mg/dL — ABNORMAL HIGH (ref 6–23)
CO2: 15 meq/L — AB (ref 19–32)
Calcium: 8.3 mg/dL — ABNORMAL LOW (ref 8.4–10.5)
Chloride: 117 mEq/L — ABNORMAL HIGH (ref 96–112)
Creatinine, Ser: 2.58 mg/dL — ABNORMAL HIGH (ref 0.50–1.10)
GFR calc Af Amer: 19 mL/min — ABNORMAL LOW (ref >90)
GFR calc non Af Amer: 16 mL/min — ABNORMAL LOW (ref >90)
GLUCOSE: 21 mg/dL — AB (ref 70–99)
POTASSIUM: 7 meq/L — AB (ref 3.7–5.3)
Sodium: 147 mEq/L (ref 137–147)

## 2013-04-25 LAB — CULTURE, BLOOD (ROUTINE X 2)

## 2013-05-02 NOTE — Progress Notes (Signed)
Patient passed away at 04:45.  Kerry Fort, RN and Gladys Damme, RN listened for a heart beat for one minute and no activity was detected.  Family at bedside.  MD notified.

## 2013-05-02 NOTE — Progress Notes (Signed)
Patient's family aware of comfort care status and requests continuation of vital sign checks.  Will continue to monitor.

## 2013-05-02 NOTE — Discharge Summary (Addendum)
Death Summary  JACKALYNN ART TTS:[id number] DOB: 1927-02-26 DOA: 08-May-2013  PCP: Noralee Space, MD PCP/Office notified: [facility]  Admit date: 05/08/13 Date of Death: May 11, 2013  Final Diagnoses:  Principal Problem:   SBO (small bowel obstruction) Active Problems:   Diastolic CHF, chronic   Acute prerenal azotemia   Acute respiratory failure   Aspiration pneumonia   DNR (do not resuscitate)   Comfort measures only status      History of present illness:   Principal Problem:  SBO (small bowel obstruction)  Active Problems:  Diastolic CHF, chronic  Acute prerenal azotemia  80 y/o female with PMH of HTN, CHF, COPD, anemia, s/p hartmann's procedure last fall by Dr. Marlou Starks for persistent diverticulitis presented with SBO, complicated with HCAP/sepsis    Hospital Course:  1. SBO; CT abd: Mildly prominent proximal and mid small bowel loops with transition point in the mid small bowel, likely due to adhesions. Findings compatible with mild small bowel obstruction.  -unfortunately did not improve despite medical therapy; patient was not a candidate for surgery  2. AKI hyper K likely due to ATN+dehydration/prerenal with diuretics/ACE  -unfortunately did not improve   3. HCAP/sepsis; CXR (4/20): New right upper lobe infiltrate consistent with pneumonia  -unfortunately did not improve  4. COPD with acute hypoxic resp failure due to pneumonia; patient was on home oxygen in the past; -unfortunately did notimprove  5. Chronic CHF with pulmonary HTN; echo (2014): LVEF 55-60%, PA peak pressure: 57mm Hg   Patient had very poor prognosis with progressive SBO, complicated with pneumonia, respiratory failure, septic shock; patient did not want any agressive intervention; she was DNR  -Patient passed away on 11-May-2013 at 04:45, family at the bedside    Time: 04.45   Signed:  [name]  [facility] 2013-05-11, 7:54 AM

## 2013-05-02 DEATH — deceased

## 2013-05-28 ENCOUNTER — Ambulatory Visit: Payer: Medicare Other | Admitting: Pulmonary Disease

## 2013-11-03 ENCOUNTER — Encounter (HOSPITAL_COMMUNITY): Payer: Self-pay | Admitting: General Practice

## 2014-06-13 IMAGING — CR DG ABD PORTABLE 2V
1 series · 2 of 2 positions shown · non-contrast
Comparison: 02/29/2012

CLINICAL DATA: Abdominal pain and distention

PORTABLE ABDOMEN - 2 VIEW

[Series 1: ap (kub) · U · 2 of 2 slices shown]
[im 1/2]
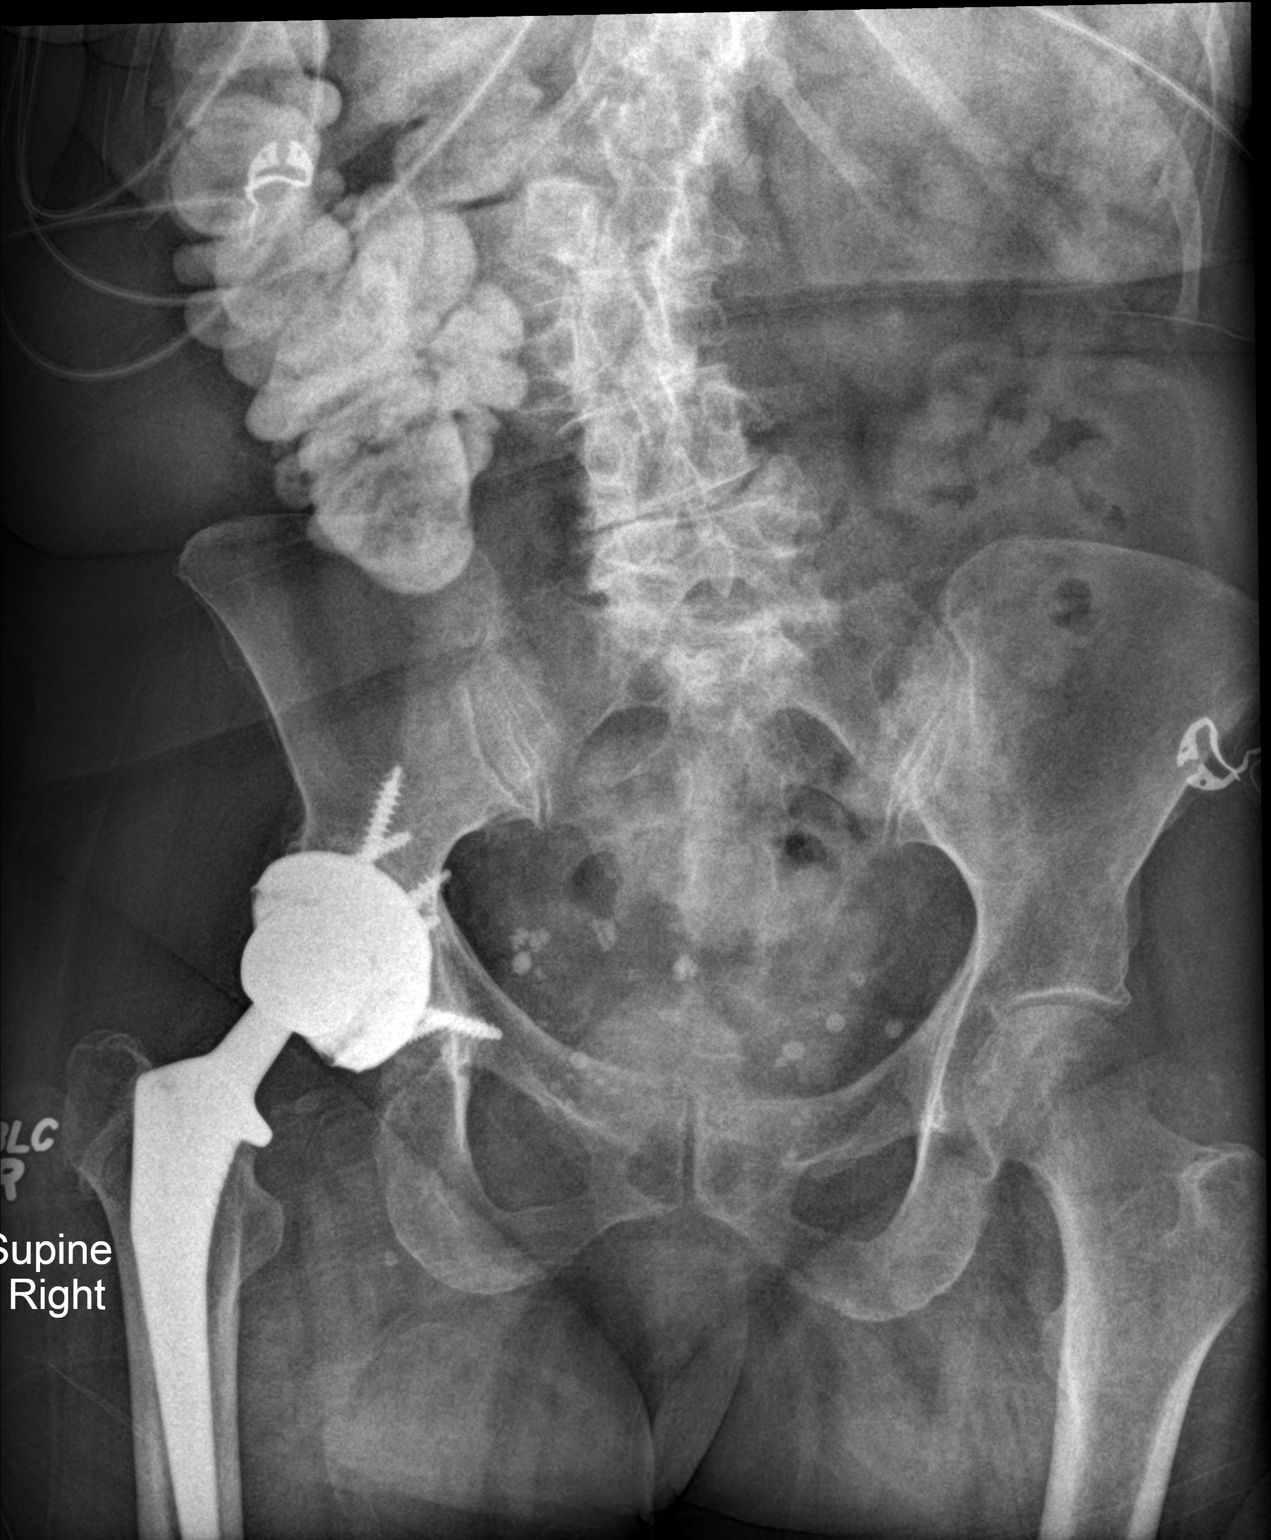
[im 2/2]
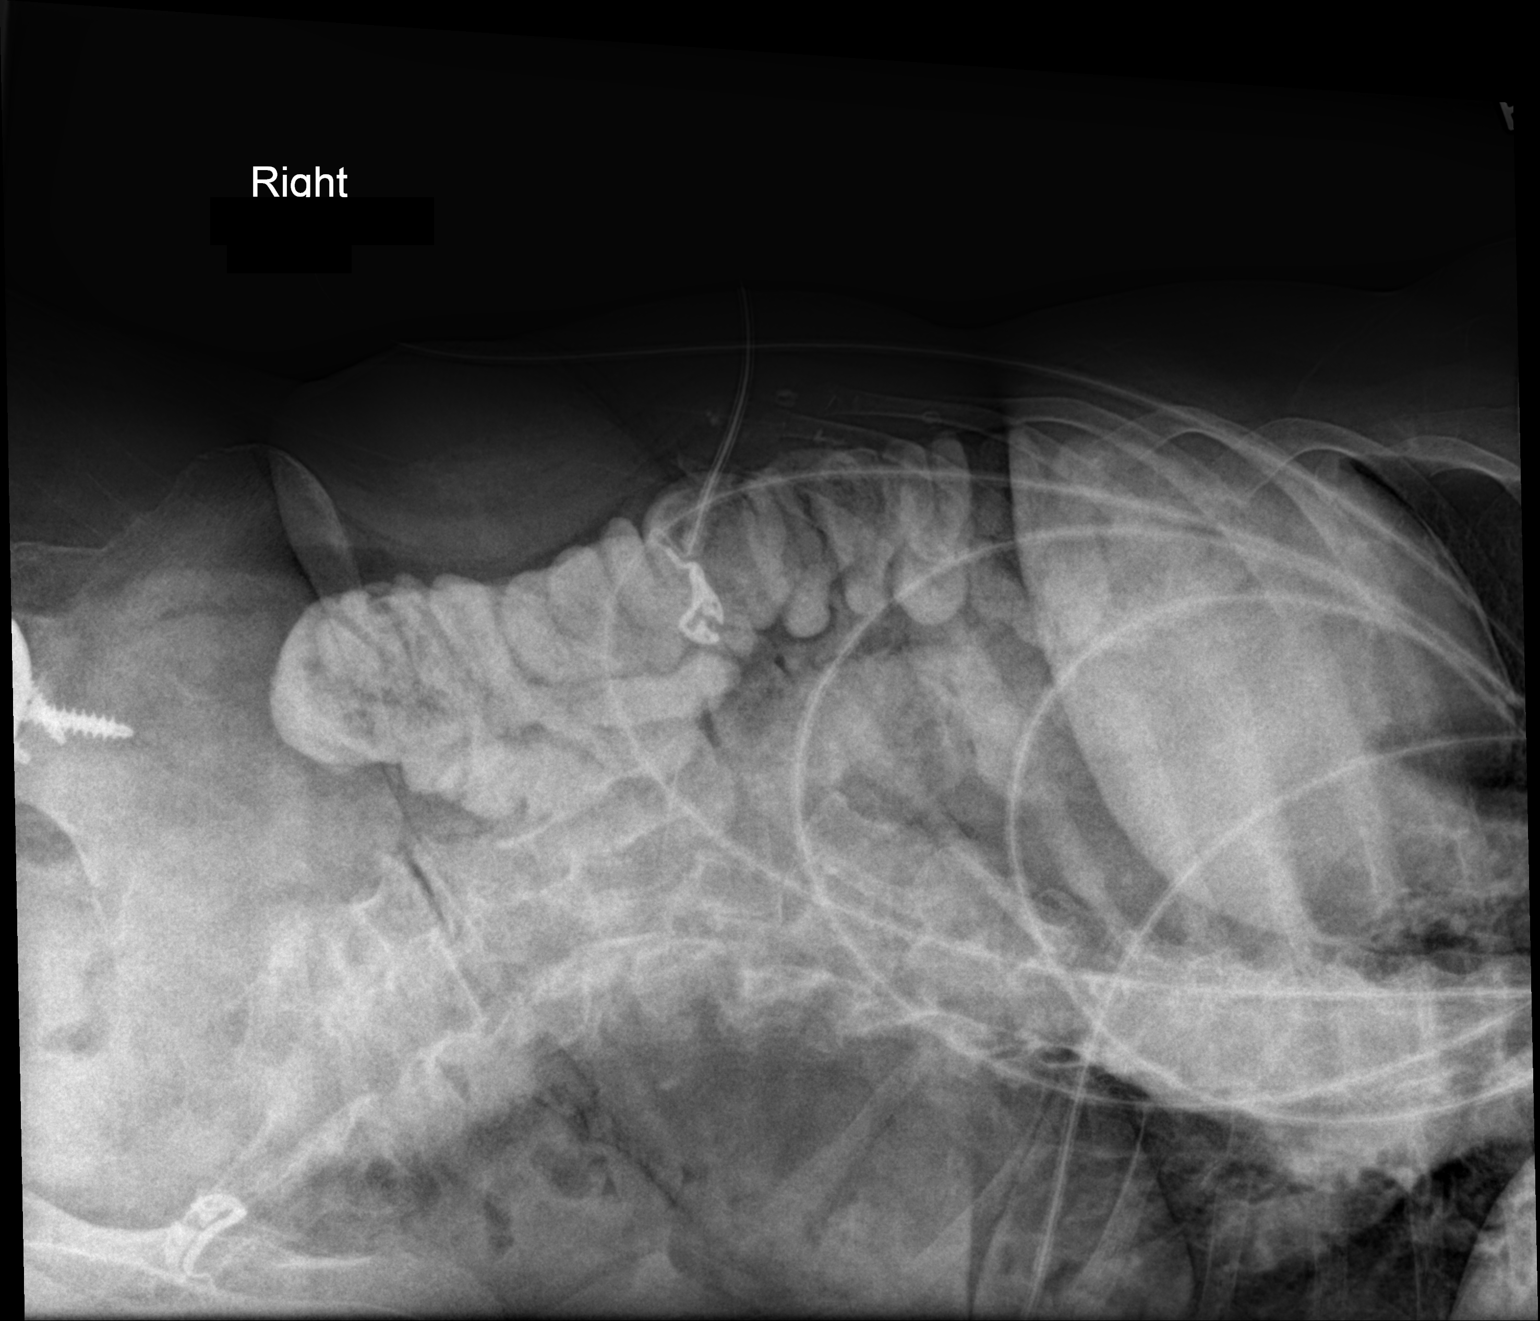

[2 of 2 positions shown; findings below may reference images not displayed]

FINDINGS: Enteric contrast material was identified within the
ascending colon and proximal transverse colon.  There are no
dilated loops of small bowel or fluid levels identified.

Scoliosis deformity is noted within the lumbar spine.  There is
multilevel degenerative disc disease within the lumbar spine.
Previous right hip arthroplasty.
IMPRESSION: 1.  Nonobstructive bowel gas pattern.
2.  Enteric contrast material is present within the colon.

## 2014-06-16 IMAGING — US IR FLUORO GUIDE CV LINE*R*
1 series · 2 of 2 positions shown · non-contrast
Comparison: none

CLINICAL DATA: Pancreatic lesion

PICC PLACEMENT WITH ULTRASOUND AND FLUOROSCOPY
TECHNIQUE: After written informed consent was obtained, patient was
placed in the supine position on angiographic table. Patency of the
right brachial vein was confirmed with ultrasound with image
documentation. An appropriate skin site was determined. Skin site
was marked. Region was prepped using maximum barrier technique
including cap and mask, sterile gown, sterile gloves, large sterile
sheet, and Chlorhexidine   as cutaneous antisepsis.  The region was
infiltrated locally with 1% lidocaine.   Under real-time ultrasound
guidance, the right brachial vein was accessed with a 21 gauge
micropuncture needle; the needle tip within the vein was confirmed
with ultrasound image documentation.   Needle exchanged over a 018
guidewire for a peel-away sheath, through which a 5-French double-
lumen  power injectable PICC trimmed to 34cm was advanced,
positioned with its tip near the cavoatrial junction.  Spot chest
radiograph confirms appropriate catheter position.  Catheter was
flushed per protocol and secured externally with 0-Prolene sutures.
The patient tolerated procedure well, with no immediate
complication.

[Series 1: ir fluoro guide cv line*right* · 2 of 2 slices shown]
[im 1/2]
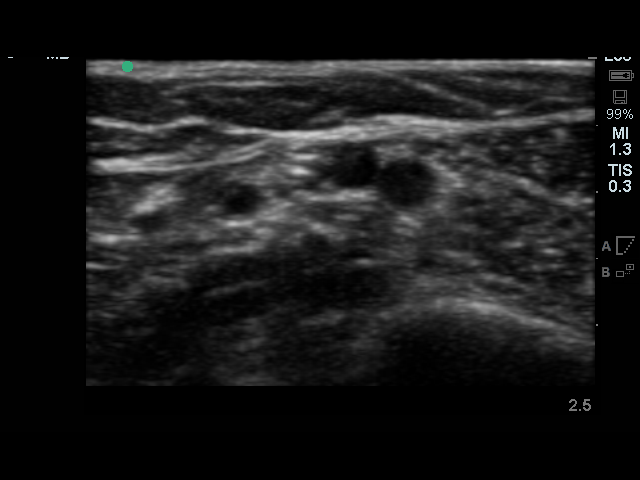
[im 2/2]
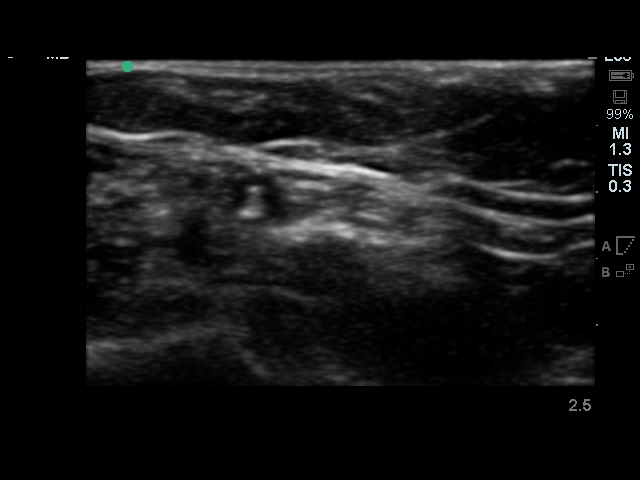

[2 of 2 positions shown; findings below may reference images not displayed]

IMPRESSION: Technically successful five French double lumen power injectable
PICC placement
